# Patient Record
Sex: Female | Born: 1935 | Race: White | Hispanic: No | Smoking: Never smoker
Health system: Southern US, Community
[De-identification: ages and names within clinical notes are randomized; demographics above are authoritative.]

## PROBLEM LIST (undated history)

## (undated) DIAGNOSIS — R251 Tremor, unspecified: Secondary | ICD-10-CM

## (undated) DIAGNOSIS — K922 Gastrointestinal hemorrhage, unspecified: Secondary | ICD-10-CM

## (undated) DIAGNOSIS — N390 Urinary tract infection, site not specified: Secondary | ICD-10-CM

## (undated) DIAGNOSIS — I729 Aneurysm of unspecified site: Secondary | ICD-10-CM

## (undated) DIAGNOSIS — K219 Gastro-esophageal reflux disease without esophagitis: Secondary | ICD-10-CM

## (undated) DIAGNOSIS — I10 Essential (primary) hypertension: Secondary | ICD-10-CM

## (undated) DIAGNOSIS — D126 Benign neoplasm of colon, unspecified: Secondary | ICD-10-CM

## (undated) DIAGNOSIS — Z8719 Personal history of other diseases of the digestive system: Secondary | ICD-10-CM

## (undated) DIAGNOSIS — D62 Acute posthemorrhagic anemia: Secondary | ICD-10-CM

## (undated) DIAGNOSIS — K5792 Diverticulitis of intestine, part unspecified, without perforation or abscess without bleeding: Secondary | ICD-10-CM

## (undated) DIAGNOSIS — I509 Heart failure, unspecified: Secondary | ICD-10-CM

## (undated) HISTORY — PX: KNEE ARTHROSCOPY: SHX127

---

## 1968-12-28 HISTORY — PX: CEREBRAL ANEURYSM REPAIR: SHX164

## 1970-04-29 DIAGNOSIS — I729 Aneurysm of unspecified site: Secondary | ICD-10-CM

## 1970-04-29 HISTORY — DX: Aneurysm of unspecified site: I72.9

## 1999-08-13 ENCOUNTER — Encounter: Admission: RE | Admit: 1999-08-13 | Discharge: 1999-08-13 | Payer: Self-pay | Admitting: *Deleted

## 1999-08-13 ENCOUNTER — Encounter: Payer: Self-pay | Admitting: *Deleted

## 1999-08-14 ENCOUNTER — Ambulatory Visit (HOSPITAL_BASED_OUTPATIENT_CLINIC_OR_DEPARTMENT_OTHER): Admission: RE | Admit: 1999-08-14 | Discharge: 1999-08-14 | Payer: Self-pay | Admitting: *Deleted

## 2000-12-23 ENCOUNTER — Encounter: Admission: RE | Admit: 2000-12-23 | Discharge: 2000-12-23 | Payer: Self-pay | Admitting: Cardiology

## 2000-12-23 ENCOUNTER — Encounter: Payer: Self-pay | Admitting: Cardiology

## 2004-02-07 ENCOUNTER — Ambulatory Visit (HOSPITAL_COMMUNITY): Admission: RE | Admit: 2004-02-07 | Discharge: 2004-02-07 | Payer: Self-pay | Admitting: Cardiology

## 2005-02-19 ENCOUNTER — Encounter: Admission: RE | Admit: 2005-02-19 | Discharge: 2005-02-19 | Payer: Self-pay | Admitting: Orthopaedic Surgery

## 2005-03-07 ENCOUNTER — Encounter: Admission: RE | Admit: 2005-03-07 | Discharge: 2005-03-07 | Payer: Self-pay | Admitting: Orthopaedic Surgery

## 2005-03-20 ENCOUNTER — Encounter: Admission: RE | Admit: 2005-03-20 | Discharge: 2005-03-20 | Payer: Self-pay | Admitting: Orthopaedic Surgery

## 2006-06-12 ENCOUNTER — Emergency Department (HOSPITAL_COMMUNITY): Admission: EM | Admit: 2006-06-12 | Discharge: 2006-06-12 | Payer: Self-pay | Admitting: Emergency Medicine

## 2006-12-24 ENCOUNTER — Emergency Department (HOSPITAL_COMMUNITY): Admission: EM | Admit: 2006-12-24 | Discharge: 2006-12-24 | Payer: Self-pay | Admitting: Emergency Medicine

## 2008-10-13 ENCOUNTER — Inpatient Hospital Stay (HOSPITAL_COMMUNITY): Admission: EM | Admit: 2008-10-13 | Discharge: 2008-10-16 | Payer: Self-pay | Admitting: Emergency Medicine

## 2009-01-23 ENCOUNTER — Ambulatory Visit: Payer: Self-pay | Admitting: Oncology

## 2009-01-24 LAB — COMPREHENSIVE METABOLIC PANEL
CO2: 30 mEq/L (ref 19–32)
Calcium: 9.7 mg/dL (ref 8.4–10.5)
Chloride: 105 mEq/L (ref 96–112)
Creatinine, Ser: 0.73 mg/dL (ref 0.40–1.20)
Glucose, Bld: 106 mg/dL — ABNORMAL HIGH (ref 70–99)
Sodium: 140 mEq/L (ref 135–145)
Total Bilirubin: 0.4 mg/dL (ref 0.3–1.2)
Total Protein: 8.9 g/dL — ABNORMAL HIGH (ref 6.0–8.3)

## 2009-01-24 LAB — CBC WITH DIFFERENTIAL/PLATELET
Eosinophils Absolute: 0.2 10*3/uL (ref 0.0–0.5)
HCT: 34.6 % — ABNORMAL LOW (ref 34.8–46.6)
LYMPH%: 31.5 % (ref 14.0–49.7)
MONO#: 0.7 10*3/uL (ref 0.1–0.9)
NEUT#: 4.5 10*3/uL (ref 1.5–6.5)
NEUT%: 56.3 % (ref 38.4–76.8)
Platelets: 247 10*3/uL (ref 145–400)
WBC: 8 10*3/uL (ref 3.9–10.3)
lymph#: 2.5 10*3/uL (ref 0.9–3.3)

## 2009-01-24 LAB — LACTATE DEHYDROGENASE: LDH: 102 U/L (ref 94–250)

## 2009-01-26 LAB — KAPPA/LAMBDA LIGHT CHAINS
Kappa free light chain: 1.48 mg/dL (ref 0.33–1.94)
Kappa:Lambda Ratio: 0.02 — ABNORMAL LOW (ref 0.26–1.65)
Lambda Free Lght Chn: 60.1 mg/dL — ABNORMAL HIGH (ref 0.57–2.63)

## 2009-01-26 LAB — IMMUNOFIXATION ELECTROPHORESIS: IgA: 65 mg/dL — ABNORMAL LOW (ref 68–378)

## 2009-01-26 LAB — BETA 2 MICROGLOBULIN, SERUM: Beta-2 Microglobulin: 2.97 mg/L — ABNORMAL HIGH (ref 1.01–1.73)

## 2009-01-30 LAB — CREATININE CLEARANCE, URINE, 24 HOUR
Creatinine, 24H Ur: 742 mg/d (ref 700–1800)
Creatinine, Urine: 123.7 mg/dL
Creatinine: 0.73 mg/dL (ref 0.40–1.20)

## 2009-01-30 LAB — UIFE/LIGHT CHAINS/TP QN, 24-HR UR
Albumin, U: DETECTED
Beta, Urine: DETECTED — AB
Free Kappa Lt Chains,Ur: 0.9 mg/dL (ref 0.04–1.51)
Free Lambda Lt Chains,Ur: <0.05 mg/dL (ref 0.08–1.01)
Gamma Globulin, Urine: DETECTED — AB
Volume, Urine: 600 mL

## 2009-02-14 LAB — CBC WITH DIFFERENTIAL/PLATELET
BASO%: 0.5 % (ref 0.0–2.0)
HCT: 34.1 % — ABNORMAL LOW (ref 34.8–46.6)
MCHC: 34.2 g/dL (ref 31.5–36.0)
MONO#: 0.8 10*3/uL (ref 0.1–0.9)
NEUT%: 56.8 % (ref 38.4–76.8)
WBC: 7.6 10*3/uL (ref 3.9–10.3)
lymph#: 2.3 10*3/uL (ref 0.9–3.3)

## 2009-02-15 LAB — COMPREHENSIVE METABOLIC PANEL
ALT: <8 U/L (ref 0–35)
Albumin: 3.4 g/dL — ABNORMAL LOW (ref 3.5–5.2)
CO2: 26 mEq/L (ref 19–32)
Calcium: 9.4 mg/dL (ref 8.4–10.5)
Chloride: 104 mEq/L (ref 96–112)
Potassium: 4.3 mEq/L (ref 3.5–5.3)
Sodium: 141 mEq/L (ref 135–145)
Total Protein: 8 g/dL (ref 6.0–8.3)

## 2009-02-15 LAB — LACTATE DEHYDROGENASE: LDH: 108 U/L (ref 94–250)

## 2009-04-11 ENCOUNTER — Ambulatory Visit: Payer: Self-pay | Admitting: Oncology

## 2009-04-29 HISTORY — PX: HIP FRACTURE SURGERY: SHX118

## 2009-05-01 LAB — CBC WITH DIFFERENTIAL/PLATELET
BASO%: 0.2 % (ref 0.0–2.0)
Basophils Absolute: 0 10e3/uL (ref 0.0–0.1)
EOS%: 3 % (ref 0.0–7.0)
Eosinophils Absolute: 0.3 10e3/uL (ref 0.0–0.5)
HCT: 35 % (ref 34.8–46.6)
HGB: 11.9 g/dL (ref 11.6–15.9)
LYMPH%: 27 % (ref 14.0–49.7)
MCH: 31.7 pg (ref 25.1–34.0)
MCHC: 33.9 g/dL (ref 31.5–36.0)
MCV: 93.7 fL (ref 79.5–101.0)
MONO#: 0.8 10e3/uL (ref 0.1–0.9)
MONO%: 9.4 % (ref 0.0–14.0)
NEUT#: 5.2 10e3/uL (ref 1.5–6.5)
NEUT%: 60.4 % (ref 38.4–76.8)
Platelets: 287 10e3/uL (ref 145–400)
RBC: 3.73 10e6/uL (ref 3.70–5.45)
RDW: 13.2 % (ref 11.2–14.5)
WBC: 8.6 10e3/uL (ref 3.9–10.3)
lymph#: 2.3 10e3/uL (ref 0.9–3.3)

## 2009-05-01 LAB — COMPREHENSIVE METABOLIC PANEL
ALT: 14 U/L (ref 0–35)
AST: 21 U/L (ref 0–37)
Albumin: 3.2 g/dL — ABNORMAL LOW (ref 3.5–5.2)
Alkaline Phosphatase: 53 U/L (ref 39–117)
BUN: 22 mg/dL (ref 6–23)
CO2: 30 mEq/L (ref 19–32)
Calcium: 9.8 mg/dL (ref 8.4–10.5)
Chloride: 102 mEq/L (ref 96–112)
Creatinine, Ser: 0.82 mg/dL (ref 0.40–1.20)
Glucose, Bld: 103 mg/dL — ABNORMAL HIGH (ref 70–99)
Potassium: 3.7 mEq/L (ref 3.5–5.3)
Sodium: 139 mEq/L (ref 135–145)
Total Bilirubin: 0.5 mg/dL (ref 0.3–1.2)
Total Protein: 9.3 g/dL — ABNORMAL HIGH (ref 6.0–8.3)

## 2009-05-01 LAB — LACTATE DEHYDROGENASE: LDH: 108 U/L (ref 94–250)

## 2009-05-02 LAB — IGG, IGA, IGM
IgA: 61 mg/dL — ABNORMAL LOW (ref 68–378)
IgG (Immunoglobin G), Serum: 787 mg/dL (ref 694–1618)
IgM, Serum: 2880 mg/dL — ABNORMAL HIGH (ref 60–263)

## 2009-05-02 LAB — KAPPA/LAMBDA LIGHT CHAINS
Kappa free light chain: 1.76 mg/dL (ref 0.33–1.94)
Kappa:Lambda Ratio: 0.03 — ABNORMAL LOW (ref 0.26–1.65)
Lambda Free Lght Chn: 53 mg/dL — ABNORMAL HIGH (ref 0.57–2.63)

## 2009-05-30 ENCOUNTER — Ambulatory Visit: Payer: Self-pay | Admitting: Oncology

## 2009-06-01 LAB — COMPREHENSIVE METABOLIC PANEL
ALT: 13 U/L (ref 0–35)
AST: 21 U/L (ref 0–37)
Albumin: 3.1 g/dL — ABNORMAL LOW (ref 3.5–5.2)
Alkaline Phosphatase: 53 U/L (ref 39–117)
BUN: 19 mg/dL (ref 6–23)
CO2: 30 mEq/L (ref 19–32)
Calcium: 10.1 mg/dL (ref 8.4–10.5)
Chloride: 104 mEq/L (ref 96–112)
Creatinine, Ser: 1 mg/dL (ref 0.40–1.20)
Glucose, Bld: 110 mg/dL — ABNORMAL HIGH (ref 70–99)
Potassium: 3.5 mEq/L (ref 3.5–5.3)
Sodium: 142 mEq/L (ref 135–145)
Total Bilirubin: 0.6 mg/dL (ref 0.3–1.2)
Total Protein: 9.5 g/dL — ABNORMAL HIGH (ref 6.0–8.3)

## 2009-06-01 LAB — CBC WITH DIFFERENTIAL/PLATELET
BASO%: 0.6 % (ref 0.0–2.0)
Basophils Absolute: 0.1 10*3/uL (ref 0.0–0.1)
EOS%: 1.5 % (ref 0.0–7.0)
Eosinophils Absolute: 0.1 10*3/uL (ref 0.0–0.5)
HCT: 36.4 % (ref 34.8–46.6)
HGB: 11.8 g/dL (ref 11.6–15.9)
LYMPH%: 29.3 % (ref 14.0–49.7)
MCH: 30 pg (ref 25.1–34.0)
MCHC: 32.4 g/dL (ref 31.5–36.0)
MCV: 92.6 fL (ref 79.5–101.0)
MONO#: 0.8 10*3/uL (ref 0.1–0.9)
MONO%: 9.2 % (ref 0.0–14.0)
NEUT#: 5 10*3/uL (ref 1.5–6.5)
NEUT%: 59.4 % (ref 38.4–76.8)
Platelets: 328 10*3/uL (ref 145–400)
RBC: 3.93 10*6/uL (ref 3.70–5.45)
RDW: 13 % (ref 11.2–14.5)
WBC: 8.5 10*3/uL (ref 3.9–10.3)
lymph#: 2.5 10*3/uL (ref 0.9–3.3)
nRBC: 0 % (ref 0–0)

## 2009-06-01 LAB — LACTATE DEHYDROGENASE: LDH: 103 U/L (ref 94–250)

## 2009-06-02 LAB — KAPPA/LAMBDA LIGHT CHAINS
Kappa free light chain: 1.46 mg/dL (ref 0.33–1.94)
Kappa:Lambda Ratio: 0.02 — ABNORMAL LOW (ref 0.26–1.65)
Lambda Free Lght Chn: 82 mg/dL — ABNORMAL HIGH (ref 0.57–2.63)

## 2009-06-02 LAB — IGG, IGA, IGM
IgA: 71 mg/dL (ref 68–378)
IgG (Immunoglobin G), Serum: 915 mg/dL (ref 694–1618)
IgM, Serum: 3250 mg/dL — ABNORMAL HIGH (ref 60–263)

## 2009-06-16 ENCOUNTER — Inpatient Hospital Stay (HOSPITAL_COMMUNITY): Admission: EM | Admit: 2009-06-16 | Discharge: 2009-06-23 | Payer: Self-pay | Admitting: Emergency Medicine

## 2009-06-27 DIAGNOSIS — D62 Acute posthemorrhagic anemia: Secondary | ICD-10-CM

## 2009-06-27 DIAGNOSIS — D126 Benign neoplasm of colon, unspecified: Secondary | ICD-10-CM

## 2009-06-27 DIAGNOSIS — Z8719 Personal history of other diseases of the digestive system: Secondary | ICD-10-CM

## 2009-06-27 HISTORY — PX: COLONOSCOPY W/ CONTROL OF HEMORRHAGE: SHX1377

## 2009-06-27 HISTORY — DX: Personal history of other diseases of the digestive system: Z87.19

## 2009-06-27 HISTORY — DX: Acute posthemorrhagic anemia: D62

## 2009-06-27 HISTORY — DX: Benign neoplasm of colon, unspecified: D12.6

## 2009-08-12 ENCOUNTER — Inpatient Hospital Stay (HOSPITAL_COMMUNITY): Admission: EM | Admit: 2009-08-12 | Discharge: 2009-08-29 | Payer: Self-pay | Admitting: Emergency Medicine

## 2009-08-14 ENCOUNTER — Encounter (INDEPENDENT_AMBULATORY_CARE_PROVIDER_SITE_OTHER): Payer: Self-pay | Admitting: Internal Medicine

## 2010-03-04 ENCOUNTER — Emergency Department (HOSPITAL_COMMUNITY): Admission: EM | Admit: 2010-03-04 | Discharge: 2010-03-04 | Payer: Self-pay | Admitting: Emergency Medicine

## 2010-03-09 ENCOUNTER — Emergency Department (HOSPITAL_COMMUNITY): Admission: EM | Admit: 2010-03-09 | Discharge: 2010-03-09 | Payer: Self-pay | Admitting: Emergency Medicine

## 2010-05-14 ENCOUNTER — Ambulatory Visit
Admission: RE | Admit: 2010-05-14 | Discharge: 2010-05-14 | Payer: Self-pay | Source: Home / Self Care | Attending: Surgery | Admitting: Surgery

## 2010-05-19 ENCOUNTER — Encounter: Payer: Self-pay | Admitting: Orthopaedic Surgery

## 2010-05-20 ENCOUNTER — Encounter (HOSPITAL_COMMUNITY): Payer: Self-pay | Admitting: Oncology

## 2010-07-10 LAB — URINALYSIS, ROUTINE W REFLEX MICROSCOPIC
Bilirubin Urine: NEGATIVE
Glucose, UA: NEGATIVE mg/dL
Ketones, ur: NEGATIVE mg/dL
Nitrite: NEGATIVE
Protein, ur: NEGATIVE mg/dL
Specific Gravity, Urine: 1.005 (ref 1.005–1.030)
Urobilinogen, UA: 0.2 mg/dL (ref 0.0–1.0)
pH: 7.5 (ref 5.0–8.0)

## 2010-07-10 LAB — CBC
HCT: 33.9 % — ABNORMAL LOW (ref 36.0–46.0)
Hemoglobin: 11.1 g/dL — ABNORMAL LOW (ref 12.0–15.0)
MCH: 30.2 pg (ref 26.0–34.0)
MCHC: 32.7 g/dL (ref 30.0–36.0)
MCV: 92.4 fL (ref 78.0–100.0)
Platelets: 278 K/uL (ref 150–400)
RBC: 3.67 MIL/uL — ABNORMAL LOW (ref 3.87–5.11)
RDW: 13.3 % (ref 11.5–15.5)
WBC: 11.9 K/uL — ABNORMAL HIGH (ref 4.0–10.5)

## 2010-07-10 LAB — URINE MICROSCOPIC-ADD ON

## 2010-07-10 LAB — DIFFERENTIAL
Basophils Absolute: 0 K/uL (ref 0.0–0.1)
Basophils Relative: 0 % (ref 0–1)
Eosinophils Absolute: 0.2 10*3/uL (ref 0.0–0.7)
Eosinophils Relative: 1 % (ref 0–5)
Lymphocytes Relative: 26 % (ref 12–46)
Lymphs Abs: 3 K/uL (ref 0.7–4.0)
Monocytes Absolute: 1.5 10*3/uL — ABNORMAL HIGH (ref 0.1–1.0)
Monocytes Relative: 12 % (ref 3–12)
Neutro Abs: 7.2 K/uL (ref 1.7–7.7)
Neutrophils Relative %: 61 % (ref 43–77)

## 2010-07-10 LAB — BASIC METABOLIC PANEL
BUN: 18 mg/dL (ref 6–23)
CO2: 27 mEq/L (ref 19–32)
Calcium: 9.8 mg/dL (ref 8.4–10.5)
Glucose, Bld: 108 mg/dL — ABNORMAL HIGH (ref 70–99)
Sodium: 131 mEq/L — ABNORMAL LOW (ref 135–145)

## 2010-07-10 LAB — BASIC METABOLIC PANEL WITH GFR
Chloride: 98 meq/L (ref 96–112)
Creatinine, Ser: 0.79 mg/dL (ref 0.4–1.2)
GFR calc Af Amer: >60 mL/min (ref >60)
GFR calc non Af Amer: >60 mL/min (ref >60)
Potassium: 3.9 meq/L (ref 3.5–5.1)

## 2010-07-10 LAB — URINE CULTURE
Colony Count: >=100000
Culture  Setup Time: 201111062123

## 2010-07-17 LAB — BASIC METABOLIC PANEL
BUN: 5 mg/dL — ABNORMAL LOW (ref 6–23)
BUN: 5 mg/dL — ABNORMAL LOW (ref 6–23)
BUN: 6 mg/dL (ref 6–23)
BUN: 3 mg/dL — ABNORMAL LOW (ref 6–23)
BUN: 6 mg/dL (ref 6–23)
BUN: 6 mg/dL (ref 6–23)
CO2: 28 mEq/L (ref 19–32)
CO2: 23 mEq/L (ref 19–32)
CO2: 23 mEq/L (ref 19–32)
CO2: 24 mEq/L (ref 19–32)
CO2: 25 mEq/L (ref 19–32)
CO2: 27 mEq/L (ref 19–32)
CO2: 27 mEq/L (ref 19–32)
CO2: 27 mEq/L (ref 19–32)
CO2: 32 mEq/L (ref 19–32)
Calcium: 7.7 mg/dL — ABNORMAL LOW (ref 8.4–10.5)
Calcium: 7.2 mg/dL — ABNORMAL LOW (ref 8.4–10.5)
Calcium: 7.5 mg/dL — ABNORMAL LOW (ref 8.4–10.5)
Calcium: 7.5 mg/dL — ABNORMAL LOW (ref 8.4–10.5)
Calcium: 7.5 mg/dL — ABNORMAL LOW (ref 8.4–10.5)
Calcium: 7.6 mg/dL — ABNORMAL LOW (ref 8.4–10.5)
Calcium: 7.7 mg/dL — ABNORMAL LOW (ref 8.4–10.5)
Calcium: 7.8 mg/dL — ABNORMAL LOW (ref 8.4–10.5)
Chloride: 106 mEq/L (ref 96–112)
Chloride: 108 mEq/L (ref 96–112)
Chloride: 106 mEq/L (ref 96–112)
Chloride: 108 mEq/L (ref 96–112)
Chloride: 109 mEq/L (ref 96–112)
Chloride: 110 mEq/L (ref 96–112)
Chloride: 110 mEq/L (ref 96–112)
Chloride: 113 mEq/L — ABNORMAL HIGH (ref 96–112)
Creatinine, Ser: 0.73 mg/dL (ref 0.4–1.2)
Creatinine, Ser: 0.77 mg/dL (ref 0.4–1.2)
Creatinine, Ser: 0.56 mg/dL (ref 0.4–1.2)
Creatinine, Ser: 0.57 mg/dL (ref 0.4–1.2)
Creatinine, Ser: 0.58 mg/dL (ref 0.4–1.2)
Creatinine, Ser: 0.66 mg/dL (ref 0.4–1.2)
GFR calc Af Amer: >60 mL/min (ref >60)
GFR calc Af Amer: >60 mL/min (ref >60)
GFR calc Af Amer: >60 mL/min (ref >60)
GFR calc Af Amer: >60 mL/min (ref >60)
GFR calc Af Amer: >60 mL/min (ref >60)
GFR calc Af Amer: >60 mL/min (ref >60)
GFR calc Af Amer: >60 mL/min (ref >60)
GFR calc Af Amer: >60 mL/min (ref >60)
GFR calc Af Amer: >60 mL/min (ref >60)
GFR calc non Af Amer: >60 mL/min (ref >60)
GFR calc non Af Amer: >60 mL/min (ref >60)
GFR calc non Af Amer: >60 mL/min (ref >60)
GFR calc non Af Amer: >60 mL/min (ref >60)
GFR calc non Af Amer: >60 mL/min (ref >60)
GFR calc non Af Amer: >60 mL/min (ref >60)
Glucose, Bld: 77 mg/dL (ref 70–99)
Glucose, Bld: 86 mg/dL (ref 70–99)
Glucose, Bld: 102 mg/dL — ABNORMAL HIGH (ref 70–99)
Glucose, Bld: 81 mg/dL (ref 70–99)
Glucose, Bld: 92 mg/dL (ref 70–99)
Glucose, Bld: 94 mg/dL (ref 70–99)
Potassium: 3.2 mEq/L — ABNORMAL LOW (ref 3.5–5.1)
Potassium: 3.2 mEq/L — ABNORMAL LOW (ref 3.5–5.1)
Potassium: 3.4 mEq/L — ABNORMAL LOW (ref 3.5–5.1)
Potassium: 3.9 mEq/L (ref 3.5–5.1)
Potassium: 4 mEq/L (ref 3.5–5.1)
Sodium: 141 mEq/L (ref 135–145)
Sodium: 137 mEq/L (ref 135–145)
Sodium: 138 mEq/L (ref 135–145)
Sodium: 141 mEq/L (ref 135–145)
Sodium: 141 mEq/L (ref 135–145)
Sodium: 142 mEq/L (ref 135–145)
Sodium: 144 mEq/L (ref 135–145)

## 2010-07-17 LAB — FOLATE: Folate: 10.8 ng/mL

## 2010-07-17 LAB — CBC
HCT: 19.4 % — ABNORMAL LOW (ref 36.0–46.0)
HCT: 22.4 % — ABNORMAL LOW (ref 36.0–46.0)
HCT: 23 % — ABNORMAL LOW (ref 36.0–46.0)
HCT: 23.1 % — ABNORMAL LOW (ref 36.0–46.0)
HCT: 23.2 % — ABNORMAL LOW (ref 36.0–46.0)
HCT: 23.5 % — ABNORMAL LOW (ref 36.0–46.0)
HCT: 25 % — ABNORMAL LOW (ref 36.0–46.0)
HCT: 26 % — ABNORMAL LOW (ref 36.0–46.0)
HCT: 39.1 % (ref 36.0–46.0)
Hemoglobin: 13.3 g/dL (ref 12.0–15.0)
Hemoglobin: 6.7 g/dL — CL (ref 12.0–15.0)
Hemoglobin: 7.7 g/dL — ABNORMAL LOW (ref 12.0–15.0)
Hemoglobin: 7.7 g/dL — ABNORMAL LOW (ref 12.0–15.0)
Hemoglobin: 8.1 g/dL — ABNORMAL LOW (ref 12.0–15.0)
Hemoglobin: 8.1 g/dL — ABNORMAL LOW (ref 12.0–15.0)
Hemoglobin: 8.1 g/dL — ABNORMAL LOW (ref 12.0–15.0)
Hemoglobin: 8.2 g/dL — ABNORMAL LOW (ref 12.0–15.0)
Hemoglobin: 8.6 g/dL — ABNORMAL LOW (ref 12.0–15.0)
Hemoglobin: 8.9 g/dL — ABNORMAL LOW (ref 12.0–15.0)
Hemoglobin: 9.2 g/dL — ABNORMAL LOW (ref 12.0–15.0)
Hemoglobin: 9.8 g/dL — ABNORMAL LOW (ref 12.0–15.0)
MCHC: 34.1 g/dL (ref 30.0–36.0)
MCHC: 33.9 g/dL (ref 30.0–36.0)
MCHC: 34.1 g/dL (ref 30.0–36.0)
MCHC: 34.3 g/dL (ref 30.0–36.0)
MCHC: 34.3 g/dL (ref 30.0–36.0)
MCHC: 34.3 g/dL (ref 30.0–36.0)
MCHC: 34.4 g/dL (ref 30.0–36.0)
MCHC: 34.4 g/dL (ref 30.0–36.0)
MCHC: 34.4 g/dL (ref 30.0–36.0)
MCHC: 34.6 g/dL (ref 30.0–36.0)
MCHC: 34.8 g/dL (ref 30.0–36.0)
MCHC: 35.1 g/dL (ref 30.0–36.0)
MCHC: 35.2 g/dL (ref 30.0–36.0)
MCV: 92.3 fL (ref 78.0–100.0)
MCV: 89.9 fL (ref 78.0–100.0)
MCV: 90.6 fL (ref 78.0–100.0)
MCV: 91 fL (ref 78.0–100.0)
MCV: 91.3 fL (ref 78.0–100.0)
MCV: 91.7 fL (ref 78.0–100.0)
MCV: 92.6 fL (ref 78.0–100.0)
MCV: 93.3 fL (ref 78.0–100.0)
MCV: 93.3 fL (ref 78.0–100.0)
MCV: 93.4 fL (ref 78.0–100.0)
MCV: 98.3 fL (ref 78.0–100.0)
Platelets: 272 10*3/uL (ref 150–400)
Platelets: 278 10*3/uL (ref 150–400)
Platelets: 228 10*3/uL (ref 150–400)
Platelets: 242 10*3/uL (ref 150–400)
Platelets: 242 10*3/uL (ref 150–400)
Platelets: 244 10*3/uL (ref 150–400)
Platelets: 248 10*3/uL (ref 150–400)
Platelets: 251 10*3/uL (ref 150–400)
Platelets: 253 10*3/uL (ref 150–400)
Platelets: 255 10*3/uL (ref 150–400)
Platelets: 257 10*3/uL (ref 150–400)
Platelets: 269 10*3/uL (ref 150–400)
Platelets: 278 10*3/uL (ref 150–400)
Platelets: 308 10*3/uL (ref 150–400)
RBC: 2.14 MIL/uL — ABNORMAL LOW (ref 3.87–5.11)
RBC: 2.22 MIL/uL — ABNORMAL LOW (ref 3.87–5.11)
RBC: 2.26 MIL/uL — ABNORMAL LOW (ref 3.87–5.11)
RBC: 2.4 MIL/uL — ABNORMAL LOW (ref 3.87–5.11)
RBC: 2.44 MIL/uL — ABNORMAL LOW (ref 3.87–5.11)
RBC: 2.52 MIL/uL — ABNORMAL LOW (ref 3.87–5.11)
RBC: 2.54 MIL/uL — ABNORMAL LOW (ref 3.87–5.11)
RBC: 2.58 MIL/uL — ABNORMAL LOW (ref 3.87–5.11)
RBC: 2.76 MIL/uL — ABNORMAL LOW (ref 3.87–5.11)
RBC: 2.87 MIL/uL — ABNORMAL LOW (ref 3.87–5.11)
RBC: 2.99 MIL/uL — ABNORMAL LOW (ref 3.87–5.11)
RBC: 3.05 MIL/uL — ABNORMAL LOW (ref 3.87–5.11)
RBC: 3.98 MIL/uL (ref 3.87–5.11)
RDW: 16 % — ABNORMAL HIGH (ref 11.5–15.5)
RDW: 14.3 % (ref 11.5–15.5)
RDW: 15.4 % (ref 11.5–15.5)
RDW: 15.9 % — ABNORMAL HIGH (ref 11.5–15.5)
RDW: 16.1 % — ABNORMAL HIGH (ref 11.5–15.5)
RDW: 16.1 % — ABNORMAL HIGH (ref 11.5–15.5)
RDW: 16.1 % — ABNORMAL HIGH (ref 11.5–15.5)
RDW: 17 % — ABNORMAL HIGH (ref 11.5–15.5)
WBC: 10.4 10*3/uL (ref 4.0–10.5)
WBC: 11.4 10*3/uL — ABNORMAL HIGH (ref 4.0–10.5)
WBC: 12.9 10*3/uL — ABNORMAL HIGH (ref 4.0–10.5)
WBC: 12.9 10*3/uL — ABNORMAL HIGH (ref 4.0–10.5)
WBC: 5.8 10*3/uL (ref 4.0–10.5)
WBC: 5.8 10*3/uL (ref 4.0–10.5)
WBC: 5.8 10*3/uL (ref 4.0–10.5)
WBC: 5.8 10*3/uL (ref 4.0–10.5)
WBC: 6.4 10*3/uL (ref 4.0–10.5)
WBC: 6.6 10*3/uL (ref 4.0–10.5)
WBC: 7.4 10*3/uL (ref 4.0–10.5)
WBC: 9.6 10*3/uL (ref 4.0–10.5)
WBC: 9.8 10*3/uL (ref 4.0–10.5)

## 2010-07-17 LAB — DIFFERENTIAL
Basophils Absolute: 0 10*3/uL (ref 0.0–0.1)
Basophils Absolute: 0.1 10*3/uL (ref 0.0–0.1)
Basophils Relative: 0 % (ref 0–1)
Basophils Relative: 1 % (ref 0–1)
Eosinophils Absolute: 0.1 10*3/uL (ref 0.0–0.7)
Eosinophils Relative: 1 % (ref 0–5)
Eosinophils Relative: 3 % (ref 0–5)
Lymphocytes Relative: 25 % (ref 12–46)
Lymphs Abs: 1.8 10*3/uL (ref 0.7–4.0)
Lymphs Abs: 1.9 10*3/uL (ref 0.7–4.0)
Lymphs Abs: 2.7 10*3/uL (ref 0.7–4.0)
Monocytes Absolute: 1 10*3/uL (ref 0.1–1.0)
Monocytes Absolute: 1.1 10*3/uL — ABNORMAL HIGH (ref 0.1–1.0)
Monocytes Relative: 17 % — ABNORMAL HIGH (ref 3–12)
Neutro Abs: 2.5 10*3/uL (ref 1.7–7.7)
Neutro Abs: 4.5 10*3/uL (ref 1.7–7.7)
Neutro Abs: 7.4 10*3/uL (ref 1.7–7.7)
Neutrophils Relative %: 43 % (ref 43–77)
Neutrophils Relative %: 61 % (ref 43–77)
Neutrophils Relative %: 65 % (ref 43–77)

## 2010-07-17 LAB — CULTURE, BLOOD (ROUTINE X 2): Culture: NO GROWTH

## 2010-07-17 LAB — PHOSPHORUS: Phosphorus: 3.2 mg/dL (ref 2.3–4.6)

## 2010-07-17 LAB — RETICULOCYTES: Retic Ct Pct: 3 % (ref 0.4–3.1)

## 2010-07-17 LAB — TYPE AND SCREEN: ABO/RH(D): B POS

## 2010-07-17 LAB — IRON AND TIBC
Saturation Ratios: 14 % — ABNORMAL LOW (ref 20–55)
TIBC: 139 ug/dL — ABNORMAL LOW (ref 250–470)
UIBC: 119 ug/dL

## 2010-07-17 LAB — VANCOMYCIN, TROUGH: Vancomycin Tr: 25.7 ug/mL (ref 10.0–20.0)

## 2010-07-17 LAB — HEMOGLOBIN AND HEMATOCRIT, BLOOD
HCT: 19.2 % — ABNORMAL LOW (ref 36.0–46.0)
HCT: 21.6 % — ABNORMAL LOW (ref 36.0–46.0)
HCT: 23.2 % — ABNORMAL LOW (ref 36.0–46.0)
Hemoglobin: 6.8 g/dL — CL (ref 12.0–15.0)
Hemoglobin: 7.3 g/dL — ABNORMAL LOW (ref 12.0–15.0)
Hemoglobin: 8.1 g/dL — ABNORMAL LOW (ref 12.0–15.0)
Hemoglobin: 8.4 g/dL — ABNORMAL LOW (ref 12.0–15.0)
Hemoglobin: 9.2 g/dL — ABNORMAL LOW (ref 12.0–15.0)

## 2010-07-17 LAB — CLOSTRIDIUM DIFFICILE EIA: C difficile Toxins A+B, EIA: NEGATIVE

## 2010-07-17 LAB — CROSSMATCH: ABO/RH(D): B POS

## 2010-07-17 LAB — URINALYSIS, ROUTINE W REFLEX MICROSCOPIC
Bilirubin Urine: NEGATIVE
Bilirubin Urine: NEGATIVE
Glucose, UA: NEGATIVE mg/dL
Glucose, UA: NEGATIVE mg/dL
Ketones, ur: 15 mg/dL — AB
Ketones, ur: NEGATIVE mg/dL
Protein, ur: NEGATIVE mg/dL
pH: 7 (ref 5.0–8.0)

## 2010-07-17 LAB — HEPATIC FUNCTION PANEL
ALT: 9 U/L (ref 0–35)
Bilirubin, Direct: <0.1 mg/dL (ref 0.0–0.3)

## 2010-07-17 LAB — POCT I-STAT, CHEM 8
BUN: 9 mg/dL (ref 6–23)
Calcium, Ion: 1.13 mmol/L (ref 1.12–1.32)
TCO2: 26 mmol/L (ref 0–100)

## 2010-07-17 LAB — MAGNESIUM: Magnesium: 1.6 mg/dL (ref 1.5–2.5)

## 2010-07-17 LAB — URINE MICROSCOPIC-ADD ON

## 2010-07-17 LAB — FERRITIN: Ferritin: 209 ng/mL (ref 10–291)

## 2010-07-17 LAB — CK TOTAL AND CKMB (NOT AT ARMC): CK, MB: 0.8 ng/mL (ref 0.3–4.0)

## 2010-07-17 LAB — POCT CARDIAC MARKERS

## 2010-07-17 LAB — LACTIC ACID, PLASMA: Lactic Acid, Venous: 3 mmol/L — ABNORMAL HIGH (ref 0.5–2.2)

## 2010-07-17 LAB — URINE CULTURE

## 2010-07-17 LAB — PROTIME-INR: Prothrombin Time: 14.7 seconds (ref 11.6–15.2)

## 2010-07-17 LAB — PREPARE RBC (CROSSMATCH)

## 2010-07-18 LAB — URINALYSIS, ROUTINE W REFLEX MICROSCOPIC
Bilirubin Urine: NEGATIVE
Hgb urine dipstick: NEGATIVE
Ketones, ur: NEGATIVE mg/dL
Leukocytes, UA: NEGATIVE
Nitrite: NEGATIVE
Nitrite: NEGATIVE
Protein, ur: NEGATIVE mg/dL
Specific Gravity, Urine: 1.014 (ref 1.005–1.030)
Urobilinogen, UA: 0.2 mg/dL (ref 0.0–1.0)
Urobilinogen, UA: 0.2 mg/dL (ref 0.0–1.0)

## 2010-07-18 LAB — CBC
HCT: 20.6 % — ABNORMAL LOW (ref 36.0–46.0)
HCT: 24.5 % — ABNORMAL LOW (ref 36.0–46.0)
HCT: 26.3 % — ABNORMAL LOW (ref 36.0–46.0)
HCT: 27 % — ABNORMAL LOW (ref 36.0–46.0)
Hemoglobin: 7.2 g/dL — ABNORMAL LOW (ref 12.0–15.0)
Hemoglobin: 8.3 g/dL — ABNORMAL LOW (ref 12.0–15.0)
Hemoglobin: 8.6 g/dL — ABNORMAL LOW (ref 12.0–15.0)
Hemoglobin: 8.6 g/dL — ABNORMAL LOW (ref 12.0–15.0)
Hemoglobin: 9.1 g/dL — ABNORMAL LOW (ref 12.0–15.0)
Hemoglobin: 9.1 g/dL — ABNORMAL LOW (ref 12.0–15.0)
Hemoglobin: 9.3 g/dL — ABNORMAL LOW (ref 12.0–15.0)
MCHC: 33.9 g/dL (ref 30.0–36.0)
MCHC: 34.6 g/dL (ref 30.0–36.0)
MCHC: 34.8 g/dL (ref 30.0–36.0)
MCHC: 34.9 g/dL (ref 30.0–36.0)
MCHC: 35.2 g/dL (ref 30.0–36.0)
MCHC: 35.7 g/dL (ref 30.0–36.0)
MCV: 91.4 fL (ref 78.0–100.0)
MCV: 91.8 fL (ref 78.0–100.0)
MCV: 92.8 fL (ref 78.0–100.0)
MCV: 92.9 fL (ref 78.0–100.0)
MCV: 93.1 fL (ref 78.0–100.0)
Platelets: 139 10*3/uL — ABNORMAL LOW (ref 150–400)
Platelets: 166 10*3/uL (ref 150–400)
Platelets: 238 10*3/uL (ref 150–400)
RBC: 2.21 MIL/uL — ABNORMAL LOW (ref 3.87–5.11)
RBC: 2.59 MIL/uL — ABNORMAL LOW (ref 3.87–5.11)
RBC: 2.63 MIL/uL — ABNORMAL LOW (ref 3.87–5.11)
RBC: 2.64 MIL/uL — ABNORMAL LOW (ref 3.87–5.11)
RBC: 2.87 MIL/uL — ABNORMAL LOW (ref 3.87–5.11)
RBC: 2.93 MIL/uL — ABNORMAL LOW (ref 3.87–5.11)
RBC: 3.51 MIL/uL — ABNORMAL LOW (ref 3.87–5.11)
RDW: 12.6 % (ref 11.5–15.5)
RDW: 12.7 % (ref 11.5–15.5)
RDW: 12.9 % (ref 11.5–15.5)
RDW: 13.3 % (ref 11.5–15.5)
RDW: 13.5 % (ref 11.5–15.5)
RDW: 13.6 % (ref 11.5–15.5)
WBC: 10.1 10*3/uL (ref 4.0–10.5)
WBC: 12.1 10*3/uL — ABNORMAL HIGH (ref 4.0–10.5)
WBC: 12.6 10*3/uL — ABNORMAL HIGH (ref 4.0–10.5)
WBC: 13.2 10*3/uL — ABNORMAL HIGH (ref 4.0–10.5)
WBC: 8.8 10*3/uL (ref 4.0–10.5)

## 2010-07-18 LAB — BASIC METABOLIC PANEL
BUN: 16 mg/dL (ref 6–23)
BUN: 17 mg/dL (ref 6–23)
CO2: 24 mEq/L (ref 19–32)
CO2: 24 mEq/L (ref 19–32)
CO2: 25 mEq/L (ref 19–32)
Calcium: 7.1 mg/dL — ABNORMAL LOW (ref 8.4–10.5)
Calcium: 7.4 mg/dL — ABNORMAL LOW (ref 8.4–10.5)
Calcium: 7.8 mg/dL — ABNORMAL LOW (ref 8.4–10.5)
Calcium: 8.2 mg/dL — ABNORMAL LOW (ref 8.4–10.5)
Chloride: 106 mEq/L (ref 96–112)
Chloride: 109 mEq/L (ref 96–112)
GFR calc Af Amer: >60 mL/min (ref >60)
GFR calc Af Amer: >60 mL/min (ref >60)
GFR calc Af Amer: >60 mL/min (ref >60)
GFR calc non Af Amer: >60 mL/min (ref >60)
GFR calc non Af Amer: >60 mL/min (ref >60)
GFR calc non Af Amer: >60 mL/min (ref >60)
Glucose, Bld: 117 mg/dL — ABNORMAL HIGH (ref 70–99)
Glucose, Bld: 122 mg/dL — ABNORMAL HIGH (ref 70–99)
Glucose, Bld: 125 mg/dL — ABNORMAL HIGH (ref 70–99)
Potassium: 3.3 mEq/L — ABNORMAL LOW (ref 3.5–5.1)
Potassium: 3.9 mEq/L (ref 3.5–5.1)
Potassium: 4 mEq/L (ref 3.5–5.1)
Sodium: 136 mEq/L (ref 135–145)
Sodium: 137 mEq/L (ref 135–145)
Sodium: 137 mEq/L (ref 135–145)
Sodium: 137 mEq/L (ref 135–145)
Sodium: 140 mEq/L (ref 135–145)

## 2010-07-18 LAB — COMPREHENSIVE METABOLIC PANEL
ALT: 13 U/L (ref 0–35)
ALT: 16 U/L (ref 0–35)
AST: 22 U/L (ref 0–37)
AST: 24 U/L (ref 0–37)
Albumin: 1.7 g/dL — ABNORMAL LOW (ref 3.5–5.2)
Alkaline Phosphatase: 55 U/L (ref 39–117)
BUN: 18 mg/dL (ref 6–23)
CO2: 26 mEq/L (ref 19–32)
CO2: 29 mEq/L (ref 19–32)
Calcium: 7.6 mg/dL — ABNORMAL LOW (ref 8.4–10.5)
Calcium: 9.6 mg/dL (ref 8.4–10.5)
Chloride: 110 mEq/L (ref 96–112)
Creatinine, Ser: 0.78 mg/dL (ref 0.4–1.2)
Creatinine, Ser: 0.84 mg/dL (ref 0.4–1.2)
GFR calc Af Amer: >60 mL/min (ref >60)
GFR calc Af Amer: >60 mL/min (ref >60)
GFR calc non Af Amer: >60 mL/min (ref >60)
GFR calc non Af Amer: >60 mL/min (ref >60)
Glucose, Bld: 122 mg/dL — ABNORMAL HIGH (ref 70–99)
Potassium: 3.6 mEq/L (ref 3.5–5.1)
Sodium: 140 mEq/L (ref 135–145)
Sodium: 141 mEq/L (ref 135–145)
Total Bilirubin: 1.1 mg/dL (ref 0.3–1.2)
Total Protein: 6.3 g/dL (ref 6.0–8.3)
Total Protein: 8.9 g/dL — ABNORMAL HIGH (ref 6.0–8.3)

## 2010-07-18 LAB — CULTURE, BLOOD (ROUTINE X 2)
Culture: NO GROWTH
Culture: NO GROWTH

## 2010-07-18 LAB — TYPE AND SCREEN
ABO/RH(D): B POS
Antibody Screen: NEGATIVE

## 2010-07-18 LAB — RETICULOCYTES
RBC.: 2.92 MIL/uL — ABNORMAL LOW (ref 3.87–5.11)
Retic Count, Absolute: 35 10*3/uL (ref 19.0–186.0)
Retic Ct Pct: 1.2 % (ref 0.4–3.1)

## 2010-07-18 LAB — URINE MICROSCOPIC-ADD ON

## 2010-07-18 LAB — MAGNESIUM
Magnesium: 1.6 mg/dL (ref 1.5–2.5)
Magnesium: 1.8 mg/dL (ref 1.5–2.5)

## 2010-07-18 LAB — URINE CULTURE: Colony Count: 45000

## 2010-07-18 LAB — PROTIME-INR
INR: 1.18 (ref 0.00–1.49)
INR: 1.29 (ref 0.00–1.49)
INR: 1.31 (ref 0.00–1.49)
INR: 1.39 (ref 0.00–1.49)
Prothrombin Time: 16 seconds — ABNORMAL HIGH (ref 11.6–15.2)
Prothrombin Time: 16.9 seconds — ABNORMAL HIGH (ref 11.6–15.2)

## 2010-07-18 LAB — DIFFERENTIAL
Eosinophils Relative: 0 % (ref 0–5)
Lymphocytes Relative: 11 % — ABNORMAL LOW (ref 12–46)
Lymphs Abs: 1.5 10*3/uL (ref 0.7–4.0)
Monocytes Relative: 6 % (ref 3–12)
Neutrophils Relative %: 83 % — ABNORMAL HIGH (ref 43–77)

## 2010-07-18 LAB — FERRITIN: Ferritin: 96 ng/mL (ref 10–291)

## 2010-07-18 LAB — IRON AND TIBC
Iron: 35 ug/dL — ABNORMAL LOW (ref 42–135)
Saturation Ratios: 15 % — ABNORMAL LOW (ref 20–55)
TIBC: 238 ug/dL — ABNORMAL LOW (ref 250–470)

## 2010-07-18 LAB — VITAMIN B12: Vitamin B-12: 576 pg/mL (ref 211–911)

## 2010-07-18 LAB — FOLATE: Folate: 8.4 ng/mL

## 2010-08-06 LAB — DIFFERENTIAL
Basophils Absolute: 0 10*3/uL (ref 0.0–0.1)
Basophils Relative: 0 % (ref 0–1)
Eosinophils Absolute: 0.1 10*3/uL (ref 0.0–0.7)
Monocytes Relative: 8 % (ref 3–12)
Neutrophils Relative %: 75 % (ref 43–77)

## 2010-08-06 LAB — BASIC METABOLIC PANEL
BUN: 21 mg/dL (ref 6–23)
BUN: 23 mg/dL (ref 6–23)
CO2: 27 mEq/L (ref 19–32)
CO2: 27 mEq/L (ref 19–32)
Calcium: 8 mg/dL — ABNORMAL LOW (ref 8.4–10.5)
Calcium: 8.9 mg/dL (ref 8.4–10.5)
Calcium: 9.1 mg/dL (ref 8.4–10.5)
Chloride: 102 mEq/L (ref 96–112)
Chloride: 114 mEq/L — ABNORMAL HIGH (ref 96–112)
Creatinine, Ser: 0.6 mg/dL (ref 0.4–1.2)
Creatinine, Ser: 0.71 mg/dL (ref 0.4–1.2)
Creatinine, Ser: 0.71 mg/dL (ref 0.4–1.2)
GFR calc Af Amer: >60 mL/min (ref >60)
GFR calc Af Amer: >60 mL/min (ref >60)
GFR calc non Af Amer: >60 mL/min (ref >60)
Glucose, Bld: 120 mg/dL — ABNORMAL HIGH (ref 70–99)
Glucose, Bld: 123 mg/dL — ABNORMAL HIGH (ref 70–99)
Potassium: 3.5 mEq/L (ref 3.5–5.1)
Potassium: 3.8 mEq/L (ref 3.5–5.1)
Sodium: 140 mEq/L (ref 135–145)
Sodium: 142 mEq/L (ref 135–145)

## 2010-08-06 LAB — URINE MICROSCOPIC-ADD ON

## 2010-08-06 LAB — CBC
HCT: 28.6 % — ABNORMAL LOW (ref 36.0–46.0)
HCT: 31 % — ABNORMAL LOW (ref 36.0–46.0)
Hemoglobin: 10.5 g/dL — ABNORMAL LOW (ref 12.0–15.0)
MCHC: 33.8 g/dL (ref 30.0–36.0)
MCHC: 34.4 g/dL (ref 30.0–36.0)
MCV: 91.6 fL (ref 78.0–100.0)
MCV: 91.9 fL (ref 78.0–100.0)
Platelets: 272 10*3/uL (ref 150–400)
Platelets: 273 10*3/uL (ref 150–400)
RBC: 2.92 MIL/uL — ABNORMAL LOW (ref 3.87–5.11)
RBC: 3.12 MIL/uL — ABNORMAL LOW (ref 3.87–5.11)
RBC: 3.51 MIL/uL — ABNORMAL LOW (ref 3.87–5.11)
RDW: 12.3 % (ref 11.5–15.5)
RDW: 12.4 % (ref 11.5–15.5)
WBC: 10.8 10*3/uL — ABNORMAL HIGH (ref 4.0–10.5)
WBC: 9.9 10*3/uL (ref 4.0–10.5)

## 2010-08-06 LAB — URINALYSIS, ROUTINE W REFLEX MICROSCOPIC
Nitrite: NEGATIVE
Protein, ur: NEGATIVE mg/dL
Specific Gravity, Urine: 1.024 (ref 1.005–1.030)
Urobilinogen, UA: 1 mg/dL (ref 0.0–1.0)

## 2010-08-06 LAB — URINE CULTURE: Colony Count: NO GROWTH

## 2010-08-06 LAB — EXPECTORATED SPUTUM ASSESSMENT W GRAM STAIN, RFLX TO RESP C

## 2010-08-06 LAB — CULTURE, BLOOD (ROUTINE X 2)

## 2010-08-06 LAB — MAGNESIUM: Magnesium: 1.9 mg/dL (ref 1.5–2.5)

## 2010-09-11 NOTE — H&P (Signed)
NAME:  Sarah Perkins, Sarah Perkins         ACCOUNT NO.:  [id number]   MEDICAL RECORD NO.:  [id number]          PATIENT TYPE:  EMS   LOCATION:  MAJO                         FACILITY:  [facility]   PHYSICIAN:  Eduard Clos, MDDATE OF BIRTH:  07/29/1917   DATE OF ADMISSION:  10/13/2008  DATE OF DISCHARGE:                              HISTORY & PHYSICAL   PRIMARY CARE PHYSICIAN:  Thayer Headings, M.D.   CHIEF COMPLAINT:  Shortness of breath.   HISTORY OF PRESENT ILLNESS:  An 77 year old female with a history of  brain aneurysm, status post surgery 30 years ago with a history of  hypertension, presented with ongoing cough and productive sputum over  the last 3 days with shortness of breath, increasing in intensity over  the last 3 to 4 days.  Patient has also been getting fatigued over the  last few days.  The patient presented to the ER.  In the ER, the patient  had a chest x-ray, which showed the possibility of pneumonia, and  patient has been admitted for further management.  Patient denies any  chest pain.  Denies any palpitations or loss of consciousness, focal  deficit but does have fatigue and weakness.  Denies any diarrhea,  discharge, dysuria, abdominal pain, fevers or chills.   PAST MEDICAL HISTORY:  1. Hypertension.  2. History of brain aneurysm, status post surgery.   PAST SURGICAL HISTORY:  Aneurysmal surgery for brain.   MEDICATIONS PRIOR TO ADMISSION:  Patient states that she takes 1 pill  for blood pressure, the name of which she does not remember.   ALLERGIES:  No known drug allergies.   SOCIAL HISTORY:  Patient denies smoking cigarettes, drinking alcohol, or  using illegal drugs.  Lives with her son.   FAMILY HISTORY:  Nothing contributory.   ALLERGIES:  No known drug allergies.   REVIEW OF SYSTEMS:  As per the history of present illness, nothing else  significant.   PHYSICAL EXAMINATION:  Patient examined at bedside.  Not in acute  distress.  VITAL SIGNS:   Blood pressure is 126/88, pulse 90 per minute, temperature  99.1, respirations 18 per minute, O2 sat 93%.  HEENT:  Anicteric.  No pallor.  CHEST:  Bilateral air entry present.  No rhonchi, no crepitation.  HEART:  S1 and S2 heard.  ABDOMEN:  Soft.  Nontender.  Bowel sounds heard.  CNS:  Awake, alert and oriented to time, place, and person.  Moves upper  and lower extremities 5/5.  EXTREMITIES:  Peripheral pulses felt.  No edema.   LABS:  Chest x-ray, right middle lobe density with  that could be  atelectatic pneumonia, chronic-appearing lung marking.  Also a  compression fracture at T12 of unknown age.   CBC:  WBC is 12.1, hemoglobin 11.1, hematocrit 32.2, platelets 272,  neutrophils 75%.  Basic metabolic panel:  Sodium 130, potassium 3.6,  chloride 102, carbon dioxide 27, glucose 120, BUN 23, creatinine 0.7,  calcium 9.1.  UA is showing small leukocytes, WBCs 3 to 6.   ASSESSMENT:  1. Pneumonia, probably community-acquired.  2. History of brain aneurysm, status post surgery.  3. History of hypertension.  4. Generalized weakness and fatigue, probably from pneumonia.   PLAN:  1. Admit patient to telemetry.  2. Start patient on Avelox IV.  3. Will place patient on p.r.n. antihypertensive and Norvasc.  4. Will get blood cultures, sputum culture.  5. Further recommendations as patient's condition evolves.      Eduard Clos, MD  Electronically Signed     ANK/MEDQ  D:  10/13/2008  T:  10/13/2008  Job:  [id number]   cc:   Thayer Headings, M.D.

## 2010-09-11 NOTE — Assessment & Plan Note (Signed)
OFFICE VISIT   Sarah Perkins, Sarah Perkins  DOB:  [birth year]                                       05/14/2010  CHART#:[id number]   REASON FOR VISIT:  Right heel ulcer.  This is a 77 year old female seen  request of Thea Silversmith for a nonhealing ulcer on the right heel with  concerns of poor circulation.   HISTORY:  This is a 77 year old female who I am seeing at the request of  Dr. Thea Silversmith for evaluation of a right heel ulcer.  The patient  developed her heel ulcer approximately 6 months ago at the time of a hip  fracture.  This was presumed to be a pressure ulcer sustained by being  in bed when she went to a nursing facility.  She has been receiving  local wound care; however, it has not healed.  She also has a brace for  pressure offloading.  The patient's foot has also been noted to be very  cold at times.  For that reasons there were concerns of poor  circulation.  She comes in today for further evaluation.   The patient suffers from hypertension, chronic renal failure.   REVIEW OF SYSTEMS:  VASCULAR:  Positive for nonhealing ulcer.  GI:  Positive for reflux.  HEME:  Positive for bleeding problems and anemia.  GU:  Positive for frequent urination.  MUSCULOSKELETAL:  Positive for arthritis.  SKIN:  Positive for a right heel ulcer.  All other review systems are negative.   PAST MEDICAL HISTORY:  1. Hypertension.  2. History of subarachnoid hemorrhage.  3. Chronic renal failure.  4. Allergic rhinitis.  5. Incontinence.  6. Overactive bladder.  7. Gastroesophageal reflux disease.  8. History of diverticular bleed.   PAST SURGICAL HISTORY:  1. Right knee arthroscopy.  2. Hysterectomy.  3. Brain surgery for subarachnoid hemorrhage.   SOCIAL HISTORY:  No alcohol.  No tobacco.   FAMILY HISTORY:  Positive for heart attack in her father at age 57 and a  stroke in her mother at age 78.   PHYSICAL EXAMINATION:  Heart rate 78, blood pressure 148/69,  respiratory  rate 320.  General:  She is resting comfortably in a wheelchair in no  distress.  HEENT:  Within normal limits.  Lungs:  Respirations are  nonlabored.  Cardiovascular:  Bounding posterior tibial pulse  bilaterally.  Cardiovascular:  Regular rate and rhythm.  Abdomen is  soft, nontender.  Musculoskeletal:  No major deformities.  Neuro:  No  focal deficits.  Skin:  She has a small 2-mm ulcer on her right heel  without surrounding erythema.  Both feet are edematous and cool.   DIAGNOSTIC STUDIES:  Ultrasound was performed today.  ABIs could not be  calculated due to noncompressible vessels.  However, she does have  triphasic waveforms.   ASSESSMENT/PLAN:  Nonhealing right heel ulcer.   PLAN:  Based on the physical exam findings of palpable pulses and  ultrasound findings with triphasic waveforms, I do not feel that  arterial insufficiency is contributing to her slow wound healing.  Most  likely she is still having some pressure contact with her brace.  I  would recommend getting her brace refitted.  I have referred her to  [facility] for getting evaluated for a new brace.  In the  interim, I would continue with her wound care.  This should ultimately  heal.  I do not think it is attributed to poor circulation with our  above findings.  I am scheduling the patient come back on a p.r.n.  basis.     Jorge Ny, MD  Electronically Signed   VWB/MEDQ  D:  05/14/2010  T:  05/15/2010  Job:  [id number]   cc:   Dr. Thea Silversmith

## 2010-09-11 NOTE — Discharge Summary (Signed)
Sarah Perkins, Sarah Perkins         ACCOUNT NO.:  [id number]   MEDICAL RECORD NO.:  [id number]          PATIENT TYPE:  INP   LOCATION:  5126                         FACILITY:  [facility]   PHYSICIAN:  Lonia Blood, M.D.DATE OF BIRTH:  [birth year]   DATE OF ADMISSION:  10/13/2008  DATE OF DISCHARGE:  10/16/2008                               DISCHARGE SUMMARY   PRIMARY CARE PHYSICIAN:  Dr. Thayer Headings.   DISCHARGE DIAGNOSES:  1. Right upper lobe nodular community-acquired pneumonia.      a.     Consider re-evaluation of right upper lobe chest with CT       scan in 1 year to assure resolution of nodularity.      b.     Clinical resolution of symptoms with empiric antibiotic       coverage.  2. Urinary tract infection.      a.     Culture unrevealing.      b.     Clinical resolution with empiric antibiotic therapy.  3. Hypertension - well controlled.  4. Hypokalemia - secondary to hydrochlorothiazide therapy with      decreased oral intake - hydrochlorothiazide discontinued.   DISCHARGE MEDICATIONS:  1. Norvasc 10 mg p.o. daily.  2. Avelox 400 mg p.o. daily x4 days then stop.   FOLLOW UP:  The patient is advised to follow up with Dr. Thayer Headings, her primary care physician, within 5-7 days for routine  medical recheck.  At that time the patient's blood pressure should be  assessed to ensure that she is responding appropriately to her Norvasc  therapy.   PROCEDURES:  CT scan of the chest October 14, 2008 - nonspecific nodularity  within the right upper lobe is likely the sequelae of inflammation or  infection.  If the patient is at low risk for bronchogenic carcinoma  (which she is), follow-up chest CT at 12 months is recommended.  Subpleural scar-like density within the right upper lobe which is likely  the sequelae of prior inflammation or infection.   CONSULTATIONS:  None.   HOSPITAL COURSE:  [name] is a very pleasant 77 year old  female with a  remarkably minimal past medical history who presented to  the hospital on October 13, 2008, with complaints of shortness of breath.  Evaluation in the emergency room revealed evidence of a right upper lobe  pneumonia.  Due to the odd appearance of the infiltrate on chest x-ray,  a CT scan of the chest was obtained to further evaluate.  This revealed  what did appear to be nodular type inflammation most consistent with an  infectious source.  In addition, the patient was noted to be hypokalemia  with a potassium in the low 3's and also a urinalysis was found to be  consistent with a urinary tract infection.  The patient was admitted to  the acute units.  Her hypokalemia was felt to be secondary to the  hydrochlorothiazide component of her of amiloride HCT blood pressure  therapy.  This was discontinued and Norvasc alone was administered.  Potassium was administered orally and the patient's hypokalemia  resolved.  Urine  was sent for culture and sputum was collected for  culture as well.  Unfortunately, the sputum sample was most consistent  with oral flora.  Urine culture, despite the positive UA, was  unrevealing.  Nonetheless, the patient clinically responded very nicely  to her empiric Avelox therapy.  By October 16, 2008, the patient was  afebrile with  stable vital signs.  The O2 saturations were in the mid to low 90s on  room air.  White blood cell count, which had originally been elevated,  had declined into a normal range.  Electrolytes proved to be stable.  As  a result, the patient was cleared for discharge home on October 16, 2008  with follow-up with her primary care physician as noted above.      Lonia Blood, M.D.  Electronically Signed     JTM/MEDQ  D:  10/16/2008  T:  10/16/2008  Job:  [id number]   cc:   Thayer Headings, M.D.

## 2010-09-14 NOTE — H&P (Signed)
[place]  Patient:    Sarah Perkins, Sarah Perkins                  MRN: [id number] Attending:  Reynolds Bowl, M.D.                         History and Physical  PREOPERATIVE DIAGNOSES:  Right knee, rule out tear of meniscus; chondromalacia,  chondral flap or loose body.  POSTOPERATIVE DIAGNOSES:  Anterior horizontal flap tear of lateral meniscus and  small area of grade 3 chondromalacia, lateral femoral condyle.  OPERATIVE PROCEDURE:  Arthroscopic exam, resection of anterior one-half of lateral meniscus and lavage of joint.  SURGEON: Reynolds Bowl, M.D.  DESCRIPTION OF PROCEDURE:  Patient was given a knee block anesthetic. Anteromedial and anterolateral portals were established.  In the suprapatellar area, there were small bits of chondral material which were washed out of the joint.  Coming along the medial side, the medial meniscus had a small area of fibrillated edge anteriorly and that was smoothed back; otherwise, the joint was pretty much normal. The anterior cruciate ligament appeared normal, then laterally, there was an anterior tear of the lateral meniscus blocking visualization of the rest of the  joint.  This was debrided using the rotary meniscotome and biting punches.  As  removed that initial mass, I saw a couple indentations of the articular cartilage which appeared to have incurred by the operative instruments; however, I could ake the adjacent areas and push in on the articular cartilage and it did not seem to give and cause any similar defect when I tried to purposely do it.  Posteriorly, with the knee in extended position, there was about a centimeter area of grade 3 chondromalacia of the lateral femoral condyle.  Most of the posterior one-half of the lateral femoral condyle had just softened anterior edge and it as left alone, so from about the anterior one-half, two-thirds of the meniscus were removed and all of  the anterior horn was removed.  Having done this, joint was irrigated a little further, then portals were approximated with 5-0 nylon.  Marcaine with epinephrine was injected in the knee and a bulky dressing applied.  Patient returned to the recovery room in good condition.  Instructions are to take Vicodin for pain, use cold pack as tolerated, do frequent quad sets and ankle pumps, full weightbearing with a straight knee for several days, see me in the office as planned or call sooner with concerns. DD:  08/14/99 TD:  08/14/99 Job: [id number]

## 2011-02-08 LAB — URINE MICROSCOPIC-ADD ON

## 2011-02-08 LAB — COMPREHENSIVE METABOLIC PANEL WITH GFR
BUN: 18
Calcium: 9.1
GFR calc non Af Amer: 57 — ABNORMAL LOW
Glucose, Bld: 129 — ABNORMAL HIGH
Total Protein: 8.9 — ABNORMAL HIGH

## 2011-02-08 LAB — COMPREHENSIVE METABOLIC PANEL
ALT: 19
AST: 30
Albumin: 2.7 — ABNORMAL LOW
Alkaline Phosphatase: 46
CO2: 28
Chloride: 102
Creatinine, Ser: 0.93
GFR calc Af Amer: >60
Potassium: 3.3 — ABNORMAL LOW
Sodium: 138
Total Bilirubin: 0.8

## 2011-02-08 LAB — URINALYSIS, ROUTINE W REFLEX MICROSCOPIC
Bilirubin Urine: NEGATIVE
Glucose, UA: NEGATIVE
Ketones, ur: NEGATIVE
Nitrite: NEGATIVE
Protein, ur: 30 — AB
Specific Gravity, Urine: 1.022
Urobilinogen, UA: 1
pH: 8

## 2011-02-08 LAB — CULTURE, BLOOD (ROUTINE X 2)
Culture: NO GROWTH
Culture: NO GROWTH

## 2011-02-08 LAB — DIFFERENTIAL
Basophils Absolute: 0.2 — ABNORMAL HIGH
Basophils Relative: 1
Eosinophils Absolute: 0
Eosinophils Relative: 0
Lymphocytes Relative: 9 — ABNORMAL LOW
Lymphs Abs: 1.6
Monocytes Absolute: 1.2 — ABNORMAL HIGH
Monocytes Relative: 7
Neutro Abs: 14.9 — ABNORMAL HIGH
Neutrophils Relative %: 83 — ABNORMAL HIGH

## 2011-02-08 LAB — URINE CULTURE: Colony Count: >=100000

## 2011-02-08 LAB — CBC
HCT: 30.9 — ABNORMAL LOW
Hemoglobin: 10.8 — ABNORMAL LOW
MCHC: 35.1
MCV: 89.8
Platelets: 273
RBC: 3.44 — ABNORMAL LOW
RDW: 12.7
WBC: 17.9 — ABNORMAL HIGH

## 2011-03-26 ENCOUNTER — Other Ambulatory Visit: Payer: Self-pay | Admitting: Neurology

## 2011-03-26 DIAGNOSIS — G252 Other specified forms of tremor: Secondary | ICD-10-CM

## 2011-03-26 DIAGNOSIS — F09 Unspecified mental disorder due to known physiological condition: Secondary | ICD-10-CM

## 2011-03-26 DIAGNOSIS — R269 Unspecified abnormalities of gait and mobility: Secondary | ICD-10-CM

## 2011-03-29 ENCOUNTER — Ambulatory Visit
Admission: RE | Admit: 2011-03-29 | Discharge: 2011-03-29 | Disposition: A | Discharge status: Home / Self Care | Payer: Medicare Other | Source: Ambulatory Visit | Attending: Neurology | Admitting: Neurology

## 2011-03-29 DIAGNOSIS — G252 Other specified forms of tremor: Secondary | ICD-10-CM

## 2011-03-29 DIAGNOSIS — G250 Essential tremor: Secondary | ICD-10-CM

## 2011-03-29 DIAGNOSIS — F09 Unspecified mental disorder due to known physiological condition: Secondary | ICD-10-CM

## 2011-03-29 DIAGNOSIS — R269 Unspecified abnormalities of gait and mobility: Secondary | ICD-10-CM

## 2011-05-15 ENCOUNTER — Emergency Department (HOSPITAL_COMMUNITY): Payer: Medicare Other

## 2011-05-15 ENCOUNTER — Encounter (HOSPITAL_COMMUNITY): Payer: Self-pay | Admitting: *Deleted

## 2011-05-15 ENCOUNTER — Inpatient Hospital Stay (HOSPITAL_COMMUNITY)
Admission: EM | Admit: 2011-05-15 | Discharge: 2011-05-21 | DRG: 871 | Disposition: A | Discharge status: Skilled Nursing Facility | Payer: Medicare Other | Source: Ambulatory Visit | Attending: Internal Medicine | Admitting: Internal Medicine

## 2011-05-15 DIAGNOSIS — I5033 Acute on chronic diastolic (congestive) heart failure: Secondary | ICD-10-CM | POA: Diagnosis present

## 2011-05-15 DIAGNOSIS — D649 Anemia, unspecified: Secondary | ICD-10-CM | POA: Diagnosis present

## 2011-05-15 DIAGNOSIS — R5381 Other malaise: Secondary | ICD-10-CM | POA: Diagnosis present

## 2011-05-15 DIAGNOSIS — E8809 Other disorders of plasma-protein metabolism, not elsewhere classified: Secondary | ICD-10-CM | POA: Diagnosis present

## 2011-05-15 DIAGNOSIS — D72829 Elevated white blood cell count, unspecified: Secondary | ICD-10-CM | POA: Diagnosis present

## 2011-05-15 DIAGNOSIS — I609 Nontraumatic subarachnoid hemorrhage, unspecified: Secondary | ICD-10-CM | POA: Insufficient documentation

## 2011-05-15 DIAGNOSIS — I509 Heart failure, unspecified: Secondary | ICD-10-CM

## 2011-05-15 DIAGNOSIS — N179 Acute kidney failure, unspecified: Secondary | ICD-10-CM | POA: Diagnosis present

## 2011-05-15 DIAGNOSIS — D696 Thrombocytopenia, unspecified: Secondary | ICD-10-CM | POA: Diagnosis not present

## 2011-05-15 DIAGNOSIS — R6510 Systemic inflammatory response syndrome (SIRS) of non-infectious origin without acute organ dysfunction: Principal | ICD-10-CM | POA: Diagnosis present

## 2011-05-15 DIAGNOSIS — K5730 Diverticulosis of large intestine without perforation or abscess without bleeding: Secondary | ICD-10-CM | POA: Diagnosis not present

## 2011-05-15 DIAGNOSIS — K5900 Constipation, unspecified: Secondary | ICD-10-CM | POA: Diagnosis present

## 2011-05-15 DIAGNOSIS — E46 Unspecified protein-calorie malnutrition: Secondary | ICD-10-CM | POA: Diagnosis not present

## 2011-05-15 DIAGNOSIS — E876 Hypokalemia: Secondary | ICD-10-CM | POA: Diagnosis present

## 2011-05-15 DIAGNOSIS — N181 Chronic kidney disease, stage 1: Secondary | ICD-10-CM | POA: Diagnosis not present

## 2011-05-15 DIAGNOSIS — I129 Hypertensive chronic kidney disease with stage 1 through stage 4 chronic kidney disease, or unspecified chronic kidney disease: Secondary | ICD-10-CM | POA: Diagnosis present

## 2011-05-15 DIAGNOSIS — I10 Essential (primary) hypertension: Secondary | ICD-10-CM

## 2011-05-15 DIAGNOSIS — K219 Gastro-esophageal reflux disease without esophagitis: Secondary | ICD-10-CM | POA: Diagnosis present

## 2011-05-15 DIAGNOSIS — Z79899 Other long term (current) drug therapy: Secondary | ICD-10-CM | POA: Diagnosis not present

## 2011-05-15 DIAGNOSIS — Z5189 Encounter for other specified aftercare: Secondary | ICD-10-CM | POA: Diagnosis not present

## 2011-05-15 DIAGNOSIS — I119 Hypertensive heart disease without heart failure: Secondary | ICD-10-CM | POA: Diagnosis not present

## 2011-05-15 DIAGNOSIS — K5790 Diverticulosis of intestine, part unspecified, without perforation or abscess without bleeding: Secondary | ICD-10-CM

## 2011-05-15 DIAGNOSIS — J189 Pneumonia, unspecified organism: Secondary | ICD-10-CM | POA: Diagnosis present

## 2011-05-15 DIAGNOSIS — Z7982 Long term (current) use of aspirin: Secondary | ICD-10-CM | POA: Diagnosis not present

## 2011-05-15 DIAGNOSIS — N189 Chronic kidney disease, unspecified: Secondary | ICD-10-CM | POA: Diagnosis present

## 2011-05-15 DIAGNOSIS — A419 Sepsis, unspecified organism: Secondary | ICD-10-CM | POA: Diagnosis not present

## 2011-05-15 DIAGNOSIS — J90 Pleural effusion, not elsewhere classified: Secondary | ICD-10-CM | POA: Diagnosis not present

## 2011-05-15 DIAGNOSIS — R0602 Shortness of breath: Secondary | ICD-10-CM | POA: Diagnosis not present

## 2011-05-15 DIAGNOSIS — R918 Other nonspecific abnormal finding of lung field: Secondary | ICD-10-CM | POA: Diagnosis not present

## 2011-05-15 DIAGNOSIS — K5731 Diverticulosis of large intestine without perforation or abscess with bleeding: Secondary | ICD-10-CM | POA: Diagnosis not present

## 2011-05-15 DIAGNOSIS — I5030 Unspecified diastolic (congestive) heart failure: Secondary | ICD-10-CM | POA: Diagnosis not present

## 2011-05-15 HISTORY — DX: Heart failure, unspecified: I50.9

## 2011-05-15 HISTORY — DX: Essential (primary) hypertension: I10

## 2011-05-15 HISTORY — DX: Aneurysm of unspecified site: I72.9

## 2011-05-15 HISTORY — DX: Gastro-esophageal reflux disease without esophagitis: K21.9

## 2011-05-15 LAB — URINE MICROSCOPIC-ADD ON

## 2011-05-15 LAB — URINALYSIS, ROUTINE W REFLEX MICROSCOPIC
Bilirubin Urine: NEGATIVE
Glucose, UA: NEGATIVE mg/dL
Ketones, ur: NEGATIVE mg/dL
Leukocytes, UA: NEGATIVE
Nitrite: NEGATIVE
Protein, ur: NEGATIVE mg/dL
Specific Gravity, Urine: 1.012 (ref 1.005–1.030)
Urobilinogen, UA: 0.2 mg/dL (ref 0.0–1.0)
pH: 5.5 (ref 5.0–8.0)

## 2011-05-15 LAB — POCT I-STAT TROPONIN I: Troponin i, poc: 0.07 ng/mL (ref 0.00–0.08)

## 2011-05-15 LAB — DIFFERENTIAL
Eosinophils Relative: 0 % (ref 0–5)
Lymphs Abs: 2.4 10*3/uL (ref 0.7–4.0)
Monocytes Absolute: 1.9 10*3/uL — ABNORMAL HIGH (ref 0.1–1.0)
Monocytes Relative: 7 % (ref 3–12)
Neutro Abs: 22.9 10*3/uL — ABNORMAL HIGH (ref 1.7–7.7)

## 2011-05-15 LAB — PRO B NATRIURETIC PEPTIDE: Pro B Natriuretic peptide (BNP): 1982 pg/mL — ABNORMAL HIGH (ref 0–450)

## 2011-05-15 LAB — COMPREHENSIVE METABOLIC PANEL
CO2: 27 mEq/L (ref 19–32)
Calcium: 8.8 mg/dL (ref 8.4–10.5)
Creatinine, Ser: 1.11 mg/dL — ABNORMAL HIGH (ref 0.50–1.10)
GFR calc Af Amer: 49 mL/min — ABNORMAL LOW (ref >90)
GFR calc non Af Amer: 42 mL/min — ABNORMAL LOW (ref >90)
Glucose, Bld: 134 mg/dL — ABNORMAL HIGH (ref 70–99)

## 2011-05-15 LAB — CULTURE, BLOOD (ROUTINE X 2)
Culture  Setup Time: 201301160912
Culture: NO GROWTH

## 2011-05-15 LAB — TSH: TSH: 0.736 u[IU]/mL (ref 0.350–4.500)

## 2011-05-15 LAB — URINE CULTURE
Colony Count: 2000
Culture  Setup Time: 201301161648

## 2011-05-15 LAB — CARDIAC PANEL(CRET KIN+CKTOT+MB+TROPI)
Relative Index: INVALID (ref 0.0–2.5)
Troponin I: <0.3 ng/mL (ref <0.30)

## 2011-05-15 LAB — BASIC METABOLIC PANEL
Calcium: 9.2 mg/dL (ref 8.4–10.5)
Chloride: 101 mEq/L (ref 96–112)
Creatinine, Ser: 0.83 mg/dL (ref 0.50–1.10)
GFR calc Af Amer: 69 mL/min — ABNORMAL LOW (ref >90)
Sodium: 138 mEq/L (ref 135–145)

## 2011-05-15 LAB — CBC
MCH: 29.9 pg (ref 26.0–34.0)
MCHC: 32.7 g/dL (ref 30.0–36.0)
Platelets: 306 10*3/uL (ref 150–400)
RDW: 13.7 % (ref 11.5–15.5)

## 2011-05-15 LAB — PHOSPHORUS: Phosphorus: 3.2 mg/dL (ref 2.3–4.6)

## 2011-05-15 MED ORDER — IPRATROPIUM BROMIDE 0.02 % IN SOLN
0.5000 mg | Freq: Four times a day (QID) | RESPIRATORY_TRACT | Status: DC
  Administered 2011-05-15 – 2011-05-19 (×16): 0.5 mg via RESPIRATORY_TRACT
  Filled 2011-05-15 (×17): qty 2.5

## 2011-05-15 MED ORDER — ONDANSETRON HCL 4 MG PO TABS: 4.0000 mg | ORAL_TABLET | Freq: Four times a day (QID) | ORAL | Status: DC | PRN

## 2011-05-15 MED ORDER — MOXIFLOXACIN HCL IN NACL 400 MG/250ML IV SOLN
400.0000 mg | Freq: Once | INTRAVENOUS | Status: AC
  Administered 2011-05-15: 400 mg via INTRAVENOUS
  Filled 2011-05-15: qty 250

## 2011-05-15 MED ORDER — POTASSIUM CHLORIDE CRYS ER 20 MEQ PO TBCR
40.0000 meq | EXTENDED_RELEASE_TABLET | ORAL | Status: AC
  Administered 2011-05-15 (×2): 40 meq via ORAL
  Filled 2011-05-15 (×2): qty 2

## 2011-05-15 MED ORDER — MORPHINE SULFATE 2 MG/ML IJ SOLN: 2.0000 mg | INTRAMUSCULAR | Status: DC | PRN

## 2011-05-15 MED ORDER — FUROSEMIDE 10 MG/ML IJ SOLN
40.0000 mg | Freq: Once | INTRAMUSCULAR | Status: AC
  Administered 2011-05-15: 40 mg via INTRAVENOUS
  Filled 2011-05-15: qty 4

## 2011-05-15 MED ORDER — GUAIFENESIN-DM 100-10 MG/5ML PO SYRP: 5.0000 mL | ORAL_SOLUTION | ORAL | Status: DC | PRN

## 2011-05-15 MED ORDER — ONDANSETRON HCL 4 MG/2ML IJ SOLN: 4.0000 mg | Freq: Four times a day (QID) | INTRAMUSCULAR | Status: DC | PRN

## 2011-05-15 MED ORDER — SODIUM CHLORIDE 0.9 % IV SOLN: INTRAVENOUS | Status: AC

## 2011-05-15 MED ORDER — CALCIUM CARBONATE 1250 (500 CA) MG PO TABS
1.0000 | ORAL_TABLET | Freq: Every day | ORAL | Status: DC
  Administered 2011-05-15 – 2011-05-21 (×7): 500 mg via ORAL
  Filled 2011-05-15 (×7): qty 1

## 2011-05-15 MED ORDER — ACETAMINOPHEN 325 MG PO TABS
650.0000 mg | ORAL_TABLET | Freq: Four times a day (QID) | ORAL | Status: DC | PRN
  Administered 2011-05-16: 650 mg via ORAL
  Filled 2011-05-15: qty 2

## 2011-05-15 MED ORDER — ALBUTEROL SULFATE (5 MG/ML) 0.5% IN NEBU
2.5000 mg | INHALATION_SOLUTION | Freq: Four times a day (QID) | RESPIRATORY_TRACT | Status: DC
  Administered 2011-05-15 – 2011-05-19 (×16): 2.5 mg via RESPIRATORY_TRACT
  Filled 2011-05-15 (×17): qty 0.5

## 2011-05-15 MED ORDER — PANTOPRAZOLE SODIUM 40 MG PO TBEC
40.0000 mg | DELAYED_RELEASE_TABLET | Freq: Every day | ORAL | Status: DC
  Administered 2011-05-15 – 2011-05-21 (×7): 40 mg via ORAL
  Filled 2011-05-15 (×4): qty 1

## 2011-05-15 MED ORDER — MOXIFLOXACIN HCL IN NACL 400 MG/250ML IV SOLN
400.0000 mg | INTRAVENOUS | Status: DC
  Administered 2011-05-15 – 2011-05-19 (×5): 400 mg via INTRAVENOUS
  Filled 2011-05-15 (×5): qty 250

## 2011-05-15 MED ORDER — ACETAMINOPHEN 650 MG RE SUPP: 650.0000 mg | Freq: Four times a day (QID) | RECTAL | Status: DC | PRN

## 2011-05-15 MED ORDER — ASPIRIN 81 MG PO CHEW
81.0000 mg | CHEWABLE_TABLET | Freq: Every day | ORAL | Status: DC
  Administered 2011-05-15 – 2011-05-21 (×7): 81 mg via ORAL
  Filled 2011-05-15 (×5): qty 1

## 2011-05-15 MED ORDER — CHOLECALCIFEROL 10 MCG (400 UNIT) PO TABS
400.0000 [IU] | ORAL_TABLET | Freq: Every day | ORAL | Status: DC
  Administered 2011-05-15 – 2011-05-21 (×7): 400 [IU] via ORAL
  Filled 2011-05-15 (×7): qty 1

## 2011-05-15 MED ORDER — FERROUS SULFATE 325 (65 FE) MG PO TABS
325.0000 mg | ORAL_TABLET | Freq: Every day | ORAL | Status: DC
  Administered 2011-05-16 – 2011-05-21 (×6): 325 mg via ORAL
  Filled 2011-05-15 (×9): qty 1

## 2011-05-15 NOTE — ED Notes (Signed)
Pt. Undress on monitor place foley in.

## 2011-05-15 NOTE — H&P (Signed)
PCP:   Thayer Headings, MD, MD   Chief Complaint:  Shortness of breath, fever, generalized malaise; hypoxemia.  HPI: 78 year old female with a past medical history significant for diastolic congestive heart failure, hypertension, stage I chronic kidney disease, gastroesophageal reflux disease, history of cerebral aneurysm and weight SAH (S/P surgery); who came to the emergency department with a one-week history of increasing shortness of breath. Patient and her son reports that over the last week she had been having difficulty breathing, nonproductive cough, generalized malaise and decreased appetite. Symptoms have gotten worse over the last 48 hours and this morning she was bed tachypneic which was the main reason why her son call EMS in order to bring her to the emergency department; at the time the EMS arrived to the patient's house her respiratory rate was in the high 20s on her oxygen saturation was 85% on room air. Patient denies any chest pain, any abdominal pain, any vomiting/nausea, any headache, no hematochezia. Of note, patient reports having another son who was recently admitted to the hospital 2/2 PNA which can represents sick contact.  Allergies:  No Known Allergies    Past Medical History  Diagnosis Date  . CHF (congestive heart failure)   . Hypertension   . Aneurysm 1972    cerebral  . Acid reflux     occasional    Past Surgical History  Procedure Date  . Fracture surgery     Prior to Admission medications   Medication Sig Start Date End Date Taking? Authorizing Provider  amiloride-hydrochlorothiazide (MODURETIC) 5-50 MG tablet Take 0.5 tablets by mouth daily.   Yes Historical Provider, MD  aspirin 81 MG chewable tablet Chew 81 mg by mouth daily.   Yes Historical Provider, MD  Calcium Carbonate (CALCIUM 500 PO) Take 1 tablet by mouth daily.   Yes Historical Provider, MD  ferrous sulfate 325 (65 FE) MG tablet Take 325 mg by mouth daily with breakfast.   Yes Historical  Provider, MD  omeprazole (PRILOSEC) 20 MG capsule Take 20 mg by mouth daily as needed. For acid reflux   Yes Historical Provider, MD  potassium chloride SA (K-DUR,KLOR-CON) 20 MEQ tablet Take 20 mEq by mouth 2 (two) times daily.   Yes Historical Provider, MD  vitamin D, CHOLECALCIFEROL, 400 UNITS tablet Take 400 Units by mouth daily.   Yes Historical Provider, MD    Social History:  does not have a smoking history on file. She does not have any smokeless tobacco history on file. She reports that she does not drink alcohol or use illicit drugs.  History reviewed. No pertinent family history.  Review of Systems:  Complete review of systems negative except as mentioned on history of present illness.  Physical Exam: Blood pressure 114/51, pulse 88, temperature 101.1 F (38.4 C), temperature source Rectal, resp. rate 25, SpO2 95.00%. General: Alert awake oriented x3, on able to speak in full sentences saying: Married 2 shortness of breath; mild distress. HEENT: Head: Normocephalic and atraumatic. Ears: No erythema or bulging of her tympanic membranes bilaterally.  Eyes: PERRLA, extraocular muscles intact, no icterus, no nystagmus.  Mouth/throat: Oropharynx clear, no erythema or exudates. Nose: No discharges out of her nostrils, no polyps appreciated. Neck: No JVD, no thyromegaly, no bruits. Cardiovascular: S1 and S2, mild tachycardia, no murmurs or gallops, no rubs. Respiratory system: Tachypnea, no accessory muscle use such; positive rhonchi diffusely over her lung fields bilaterally and also positive rales at the basis. Abdomen: Soft, non-tenderness, no distention. Positive bowel sounds. Extremities: 1-2 ++  lower extremity edema bilaterally; no cyanosis. Neurological exam: Alert, awake and oriented x3; had I nerve 2-12 grossly intact, muscle strength 4/5 bilaterally symmetrically secondary to poor effort; no focal neurologic deficit appreciated. Ambulation was not obtained due to shortness  of breath. Skin: No rashes or petechiae his appreciated.  Labs on Admission:  Results for orders placed during the hospital encounter of 05/15/11 (from the past 48 hour(s))  POCT I-STAT TROPONIN I     Status: Normal   Collection Time   05/15/11  6:16 AM      Component Value Range Comment   Troponin i, poc 0.07  0.00 - 0.08 (ng/mL)    Comment 3            CARDIAC PANEL(CRET KIN+CKTOT+MB+TROPI)     Status: Normal   Collection Time   05/15/11  6:30 AM      Component Value Range Comment   Total CK 27  7 - 177 (U/L)    CK, MB 1.6  0.3 - 4.0 (ng/mL)    Troponin I <0.30  <0.30 (ng/mL)    Relative Index RELATIVE INDEX IS INVALID  0.0 - 2.5    BASIC METABOLIC PANEL     Status: Abnormal   Collection Time   05/15/11  6:30 AM      Component Value Range Comment   Sodium 138  135 - 145 (mEq/L)    Potassium 3.3 (*) 3.5 - 5.1 (mEq/L)    Chloride 101  96 - 112 (mEq/L)    CO2 26  19 - 32 (mEq/L)    Glucose, Bld 120 (*) 70 - 99 (mg/dL)    BUN 22  6 - 23 (mg/dL)    Creatinine, Ser 1.91  0.50 - 1.10 (mg/dL)    Calcium 9.2  8.4 - 10.5 (mg/dL)    GFR calc non Af Amer 60 (*) >90 (mL/min)    GFR calc Af Amer 69 (*) >90 (mL/min)   PRO B NATRIURETIC PEPTIDE     Status: Abnormal   Collection Time   05/15/11  6:30 AM      Component Value Range Comment   Pro B Natriuretic peptide (BNP) 1982.0 (*) 0 - 450 (pg/mL)   PROTIME-INR     Status: Normal   Collection Time   05/15/11  6:30 AM      Component Value Range Comment   Prothrombin Time 14.8  11.6 - 15.2 (seconds)    INR 1.14  0.00 - 1.49    CBC     Status: Abnormal   Collection Time   05/15/11  6:30 AM      Component Value Range Comment   WBC 27.2 (*) 4.0 - 10.5 (K/uL)    RBC 3.48 (*) 3.87 - 5.11 (MIL/uL)    Hemoglobin 10.4 (*) 12.0 - 15.0 (g/dL)    HCT 47.8 (*) 29.5 - 46.0 (%)    MCV 91.4  78.0 - 100.0 (fL)    MCH 29.9  26.0 - 34.0 (pg)    MCHC 32.7  30.0 - 36.0 (g/dL)    RDW 62.1  30.8 - 65.7 (%)    Platelets 306  150 - 400 (K/uL)     DIFFERENTIAL     Status: Abnormal   Collection Time   05/15/11  6:30 AM      Component Value Range Comment   Neutrophils Relative 84 (*) 43 - 77 (%)    Lymphocytes Relative 9 (*) 12 - 46 (%)    Monocytes Relative 7  3 - 12 (%)    Eosinophils Relative 0  0 - 5 (%)    Basophils Relative 0  0 - 1 (%)    Neutro Abs 22.9 (*) 1.7 - 7.7 (K/uL)    Lymphs Abs 2.4  0.7 - 4.0 (K/uL)    Monocytes Absolute 1.9 (*) 0.1 - 1.0 (K/uL)    Eosinophils Absolute 0.0  0.0 - 0.7 (K/uL)    Basophils Absolute 0.0  0.0 - 0.1 (K/uL)    WBC Morphology ATYPICAL LYMPHOCYTES      Smear Review LARGE PLATELETS PRESENT     LACTIC ACID, PLASMA     Status: Normal   Collection Time   05/15/11  6:32 AM      Component Value Range Comment   Lactic Acid, Venous 1.6  0.5 - 2.2 (mmol/L)   URINALYSIS, ROUTINE W REFLEX MICROSCOPIC     Status: Abnormal   Collection Time   05/15/11  8:37 AM      Component Value Range Comment   Color, Urine YELLOW  YELLOW     APPearance CLEAR  CLEAR     Specific Gravity, Urine 1.012  1.005 - 1.030     pH 5.5  5.0 - 8.0     Glucose, UA NEGATIVE  NEGATIVE (mg/dL)    Hgb urine dipstick SMALL (*) NEGATIVE     Bilirubin Urine NEGATIVE  NEGATIVE     Ketones, ur NEGATIVE  NEGATIVE (mg/dL)    Protein, ur NEGATIVE  NEGATIVE (mg/dL)    Urobilinogen, UA 0.2  0.0 - 1.0 (mg/dL)    Nitrite NEGATIVE  NEGATIVE     Leukocytes, UA NEGATIVE  NEGATIVE    URINE MICROSCOPIC-ADD ON     Status: Normal   Collection Time   05/15/11  8:37 AM      Component Value Range Comment   Squamous Epithelial / LPF RARE  RARE     WBC, UA 0-2  <3 (WBC/hpf)    RBC / HPF 3-6  <3 (RBC/hpf)    Bacteria, UA RARE  RARE     Urine-Other MUCOUS PRESENT       Radiological Exams on Admission: Dg Chest Portable 1 View  05/15/2011  *RADIOLOGY REPORT*  Clinical Data: Shortness of breath and fever.  PORTABLE CHEST - 1 VIEW  Comparison: Chest radiograph performed 08/29/2009  Findings: The lungs are well-aerated.  Vascular congestion is  noted, with mild bibasilar airspace opacities.  A small left pleural effusion is suspected.  Findings may reflect pulmonary edema, though pneumonia could have a similar appearance.  No pneumothorax is seen.  There is no evidence of pneumothorax.  The cardiomediastinal silhouette is borderline normal in size; calcification is noted within the aortic arch.  No acute osseous abnormalities are seen.  IMPRESSION: Vascular congestion, with mild bibasilar airspace opacities and small left pleural effusion.  Findings may reflect pulmonary edema or pneumonia.  Original Report Authenticated By: Tonia Ghent, M.D.     Assessment/Plan 1-SIRS (systemic inflammatory response syndrome): Most likely secondary to pneumonia; will check a strep urine antigen, Legionella urine antigen, blood cultures and urinalysis. Patient will be started on Avelox IV for community-acquired pneumonia. We'll provide supportive care and follow her symptoms. Will use part of consideration for her symptoms; unfortunately she has been having her symptoms for over a week now; so not really benefit of starting Tamiflu at this point.  2-Diastolic CHF, acute on chronic: Unable to see on file last 2-D echo; will repeat a 2-D echo, will follow cardiac  enzymes, the patient will be admitted to telemetry. We'll follow a straight eyes and nose, heart healthy diet, daily weights and will use Lasix as her blood pressure tolerated.  3-HTN (hypertension): See the patient have a soft blood pressure at this moment Will he'll her antihypertensive drugs.  4-CKD (chronic kidney disease), stage I: Is stable and at baseline; will follow renal function.  5-Leukocytosis: Secondary to problem #1. Will follow [employer] trend; continue antibiotics.  6-GERD (gastroesophageal reflux disease): Continue PPI.  7-Hypokalemia: Will replete her potassium, check a magnesium level, follow be made in a.m.  8-DVT: SCDs.    Time Spent on Admission: 50  minutes  Rashawnda Gaba [facility] [phone]  05/15/2011, 10:01 AM

## 2011-05-15 NOTE — Progress Notes (Signed)
Utilization Review Completed.Marcille Barman T1/16/2013   

## 2011-05-15 NOTE — ED Notes (Signed)
Son at bedside.

## 2011-05-15 NOTE — ED Notes (Signed)
NRB removed and pt placed on 2L [place] per Dr Patrica Duel.  Sats down to 93.  O2 increased to 3.5 L with rr of 22.  Per son, pt is breathing "easier" than she was at home.

## 2011-05-15 NOTE — ED Notes (Signed)
Pt with hx of chf, afib, pnx to ED c/o sob.   Pt lives with son and he went in the room to check on pt and felt she wasn't breathing well.  He called EMS.  85% on RA when EMS arrived and placed on NRB  With sats of 99%.  BIL pitting edema in lower extremities.

## 2011-05-15 NOTE — ED Notes (Signed)
Rhonchi bi-laterally and shallow breaths.  Pt has wound on rt heal that is in advanced stage of healing.  Per son, the wound drains and he has kept it wrapped also reports toenails are a problem and right greater toe has a non healing wound that is draining.  Removed bandgages and toe is red but no drainage, heel is a scab.  Pitting edema bilatterally in lower extremities with +2 pitting and painful to touch.  Son reports this has been increasing for several weeks.  Pt denies pain.  Son reports that pt is in wheelchair at home but denies any other disability.

## 2011-05-15 NOTE — ED Notes (Signed)
hospitalist at bedside

## 2011-05-15 NOTE — ED Notes (Signed)
First meeting patient. Patient resting with audible rhonchi. Patient remains on monitor and 3L oxygen with saturation of 94%. Patient's son at bedside.

## 2011-05-15 NOTE — ED Provider Notes (Signed)
History     CSN: [id number]  Arrival date & time 05/15/11  0608   First MD Initiated Contact with Patient 05/15/11 0617      Chief Complaint  Patient presents with  . Shortness of Breath    (Consider location/radiation/quality/duration/timing/severity/associated sxs/prior treatment) The history is provided by the patient, the EMS personnel and a relative.   Patient presents to the emergency department with a one-week history of increasing shortness of breath.  Her son noted that this morning she was having more difficulty breathing and called EMS.  Patient was found to be in respiratory distress by EMS and had a pulse ox of 85% on room air.  Patient denies chest pain, abdominal pain, vomiting/nausea, weakness, or headache.  Patient is able to give [place] a good history. No past medical history on file.  No past surgical history on file.  No family history on file.  History  Substance Use Topics  . Smoking status: Not on file  . Smokeless tobacco: Not on file  . Alcohol Use: Not on file    OB History    No data available      Review of Systems All pertinent positives and negatives reviewed in the history of present illness  Allergies  Review of patient's allergies indicates no known allergies.  Home Medications  No current outpatient prescriptions on file.  BP 113/58  Pulse 108  Resp 28  SpO2 100%  Physical Exam  Constitutional: She appears well-developed and well-nourished.  HENT:  Head: Normocephalic and atraumatic.  Mouth/Throat: Oropharynx is clear and moist. No oropharyngeal exudate.  Eyes: Pupils are equal, round, and reactive to light.  Neck: Normal range of motion. Neck supple.  Cardiovascular: Regular rhythm and normal heart sounds.  Tachycardia present.  Exam reveals no gallop and no friction rub.   No murmur heard. Pulmonary/Chest: No accessory muscle usage. Tachypnea noted. No apnea. No respiratory distress. She has rhonchi in the right upper field,  the right middle field, the right lower field, the left upper field, the left middle field and the left lower field. She has rales in the right upper field, the right middle field, the right lower field, the left upper field, the left middle field and the left lower field.  Abdominal: Soft. Bowel sounds are normal. She exhibits no distension. There is no tenderness.  Neurological: She is alert.  Skin: Skin is warm and dry. No rash noted.    ED Course  Procedures (including critical care time)   Labs Reviewed  POCT I-STAT TROPONIN I  CARDIAC PANEL(CRET KIN+CKTOT+MB+TROPI)  BASIC METABOLIC PANEL  I-STAT TROPONIN I  PRO B NATRIURETIC PEPTIDE  PROTIME-INR  CULTURE, BLOOD (ROUTINE X 2)  CULTURE, BLOOD (ROUTINE X 2)  CBC  DIFFERENTIAL  LACTIC ACID, PLASMA   Dg Chest Portable 1 View  05/15/2011  *RADIOLOGY REPORT*  Clinical Data: Shortness of breath and fever.  PORTABLE CHEST - 1 VIEW  Comparison: Chest radiograph performed 08/29/2009  Findings: The lungs are well-aerated.  Vascular congestion is noted, with mild bibasilar airspace opacities.  A small left pleural effusion is suspected.  Findings may reflect pulmonary edema, though pneumonia could have a similar appearance.  No pneumothorax is seen.  There is no evidence of pneumothorax.  The cardiomediastinal silhouette is borderline normal in size; calcification is noted within the aortic arch.  No acute osseous abnormalities are seen.  IMPRESSION: Vascular congestion, with mild bibasilar airspace opacities and small left pleural effusion.  Findings may reflect pulmonary edema  or pneumonia.  Original Report Authenticated By: Tonia Ghent, M.D.     Results for orders placed during the hospital encounter of 05/15/11  CARDIAC PANEL(CRET KIN+CKTOT+MB+TROPI)      Component Value Range   Total CK 27  7 - 177 (U/L)   CK, MB 1.6  0.3 - 4.0 (ng/mL)   Troponin I <0.30  <0.30 (ng/mL)   Relative Index RELATIVE INDEX IS INVALID  0.0 - 2.5   BASIC  METABOLIC PANEL      Component Value Range   Sodium 138  135 - 145 (mEq/L)   Potassium 3.3 (*) 3.5 - 5.1 (mEq/L)   Chloride 101  96 - 112 (mEq/L)   CO2 26  19 - 32 (mEq/L)   Glucose, Bld 120 (*) 70 - 99 (mg/dL)   BUN 22  6 - 23 (mg/dL)   Creatinine, Ser 6.57  0.50 - 1.10 (mg/dL)   Calcium 9.2  8.4 - 84.6 (mg/dL)   GFR calc non Af Amer 60 (*) >90 (mL/min)   GFR calc Af Amer 69 (*) >90 (mL/min)  PRO B NATRIURETIC PEPTIDE      Component Value Range   Pro B Natriuretic peptide (BNP) 1982.0 (*) 0 - 450 (pg/mL)  PROTIME-INR      Component Value Range   Prothrombin Time 14.8  11.6 - 15.2 (seconds)   INR 1.14  0.00 - 1.49   CBC      Component Value Range   WBC 27.2 (*) 4.0 - 10.5 (K/uL)   RBC 3.48 (*) 3.87 - 5.11 (MIL/uL)   Hemoglobin 10.4 (*) 12.0 - 15.0 (g/dL)   HCT 96.2 (*) 95.2 - 46.0 (%)   MCV 91.4  78.0 - 100.0 (fL)   MCH 29.9  26.0 - 34.0 (pg)   MCHC 32.7  30.0 - 36.0 (g/dL)   RDW 84.1  32.4 - 40.1 (%)   Platelets 306  150 - 400 (K/uL)  DIFFERENTIAL      Component Value Range   Neutrophils Relative 84 (*) 43 - 77 (%)   Lymphocytes Relative 9 (*) 12 - 46 (%)   Monocytes Relative 7  3 - 12 (%)   Eosinophils Relative 0  0 - 5 (%)   Basophils Relative 0  0 - 1 (%)   Neutro Abs 22.9 (*) 1.7 - 7.7 (K/uL)   Lymphs Abs 2.4  0.7 - 4.0 (K/uL)   Monocytes Absolute 1.9 (*) 0.1 - 1.0 (K/uL)   Eosinophils Absolute 0.0  0.0 - 0.7 (K/uL)   Basophils Absolute 0.0  0.0 - 0.1 (K/uL)   WBC Morphology ATYPICAL LYMPHOCYTES     Smear Review LARGE PLATELETS PRESENT    LACTIC ACID, PLASMA      Component Value Range   Lactic Acid, Venous 1.6  0.5 - 2.2 (mmol/L)  POCT I-STAT TROPONIN I      Component Value Range   Troponin i, poc 0.07  0.00 - 0.08 (ng/mL)   Comment 3                MDM   Date: 05/15/2011  [id number]  Rhythm: sinus tachycardia  QRS Axis: normal  Intervals: normal  ST/T Wave abnormalities: nonspecific T wave changes  Conduction Disutrbances:none  Narrative  Interpretation:   Old EKG Reviewed: The patient is tachy here today otherwise no significant changes.  MDM Reviewed: nursing note, vitals and previous chart Reviewed previous: labs, ECG and x-ray Interpretation: labs, ECG and x-ray Total time providing critical care: 30-74 minutes. This  excludes time spent performing separately reportable procedures and services. Consults: admitting MD   CRITICAL CARE Performed by: Carlyle Dolly   Total critical care time: 35  Critical care time was exclusive of separately billable procedures and treating other patients.  Critical care was necessary to treat or prevent imminent or life-threatening deterioration.  Critical care was time spent personally by me on the following activities: development of treatment plan with patient and/or surrogate as well as nursing, discussions with consultants, evaluation of patient's response to treatment, examination of patient, obtaining history from patient or surrogate, ordering and performing treatments and interventions, ordering and review of laboratory studies, ordering and review of radiographic studies, pulse oximetry and re-evaluation of patient's condition.    8:07 AM The nurse advised that the patient's blood pressure was 92 systolically that her pulse ox was covering in the upper 80s on 3 L.  Greg from a sore therapy within the right and then place her on 4 L nasal cannula and brought her pulse ox up to 93%.  Advised the nurse to gently give her some fluids it may be 50 cc an hour to help with her blood pressure.  She does not appear septic at this time she is oriented and able to answer questions appropriately.  Her lactic acid was 1.6 as well.       Carlyle Dolly, PA-C 05/15/11 0757  Carlyle Dolly, PA-C 05/15/11 [phone]

## 2011-05-15 NOTE — ED Notes (Signed)
X-ray at bedside

## 2011-05-15 NOTE — ED Notes (Signed)
Pt with diffuse rhonci and crackles.  BIL 1+ pitting edema.  Pt denies any chest pain.

## 2011-05-15 NOTE — ED Notes (Signed)
Report called to [facility]. Patient transfer to 47.

## 2011-05-16 ENCOUNTER — Inpatient Hospital Stay (HOSPITAL_COMMUNITY): Payer: Medicare Other

## 2011-05-16 LAB — CBC
MCHC: 33 g/dL (ref 30.0–36.0)
MCV: 91 fL (ref 78.0–100.0)
Platelets: 247 10*3/uL (ref 150–400)
RDW: 14 % (ref 11.5–15.5)
WBC: 25 10*3/uL — ABNORMAL HIGH (ref 4.0–10.5)

## 2011-05-16 LAB — BASIC METABOLIC PANEL
BUN: 35 mg/dL — ABNORMAL HIGH (ref 6–23)
CO2: 23 mEq/L (ref 19–32)
Calcium: 8.6 mg/dL (ref 8.4–10.5)
Creatinine, Ser: 1.35 mg/dL — ABNORMAL HIGH (ref 0.50–1.10)

## 2011-05-16 LAB — LEGIONELLA ANTIGEN, URINE

## 2011-05-16 LAB — STREP PNEUMONIAE URINARY ANTIGEN: Strep Pneumo Urinary Antigen: POSITIVE — AB

## 2011-05-16 MED ORDER — MAGNESIUM SULFATE 40 MG/ML IJ SOLN
2.0000 g | Freq: Once | INTRAMUSCULAR | Status: DC
  Filled 2011-05-16: qty 50

## 2011-05-16 MED ORDER — MAGNESIUM SULFATE 40 MG/ML IJ SOLN: 2.0000 g | Freq: Once | INTRAMUSCULAR | Status: DC

## 2011-05-16 MED ORDER — FUROSEMIDE 40 MG PO TABS
40.0000 mg | ORAL_TABLET | Freq: Every day | ORAL | Status: DC
  Administered 2011-05-16 – 2011-05-21 (×6): 40 mg via ORAL
  Filled 2011-05-16 (×6): qty 1

## 2011-05-16 MED ORDER — POTASSIUM CHLORIDE CRYS ER 20 MEQ PO TBCR
40.0000 meq | EXTENDED_RELEASE_TABLET | ORAL | Status: AC
  Administered 2011-05-16 (×3): 40 meq via ORAL
  Filled 2011-05-16: qty 2
  Filled 2011-05-16: qty 1
  Filled 2011-05-16: qty 2

## 2011-05-16 MED ORDER — MAGNESIUM SULFATE IN D5W 10-5 MG/ML-% IV SOLN
1.0000 g | Freq: Once | INTRAVENOUS | Status: AC
  Administered 2011-05-16: 1 g via INTRAVENOUS
  Filled 2011-05-16: qty 100

## 2011-05-16 NOTE — Clinical Documentation Improvement (Signed)
RESPIRATORY FAILURE DOCUMENTATION CLARIFICATION QUERY   THIS DOCUMENT IS NOT A PERMANENT PART OF THE MEDICAL RECORD  Please update your documentation within the medical record to reflect your response to this query.                                                                                     05/16/11  Dr. Gwenlyn Perking,  In a better effort to capture your patient's severity of illness, reflect appropriate length of stay and utilization of resources, a review of the patient medical record has revealed the following indicators.    Based on your clinical judgment, please document in the progress notes and discharge summary if a condition below provides greater specificity regarding the patient's respiratory status and treatment provided prior to admission:  - Acute Respiratory Failure, resolved  - Acute Respiratory Insufficiency, resolved  - Other Condition (please specify)  - Unable to Clinically Determine   Clinical Information:   "Symptoms have gotten worse over the last 48 hours and this morning she was bed tachypneic which was the main reason why her son call EMS in order to bring her to the emergency department; at the time the EMS arrived to the patient's house her respiratory rate was in the high 20s on her oxygen saturation was 85% on room air. Respiratory system: Tachypnea, no accessory muscle use such; positive rhonchi diffusely over her lung fields bilaterally and also positive rales at the bases." Lyndsie Wallman 05/15/2011, 10:01 AM  "Patient was found to be in respiratory distress by EMS and had a pulse ox of 85% on room air."  Carlyle Dolly, PA-C 05/15/11 0757  "Pt with hx of chf, afib, pnx to ED c/o sob. Pt lives with son and he went in the room to check on pt and felt she wasn't breathing well. He called EMS. 85% on RA when EMS arrived and placed on NRB With sats of 99%. BIL pitting edema in lower extremities."  ED Notes  Monico Hoar, RN  at 05/15/11  [phone]    In responding to this query please exercise your independent judgment.  The fact that a query is asked, does not imply that any particular answer is desired or expected.    Reviewed: Probably acute respiratory failure with hypoxia; improved with oxygen supplementation and lasix given in ED. By the time patient was seen by myself; she was not in resp distress.  Thank You,  Jerral Ralph  RN BSN Certified Clinical Documentation Specialist: Cell [phone]  Health Information Management [place]  TO RESPOND TO THE THIS QUERY, FOLLOW THE INSTRUCTIONS BELOW:  1. If needed, update documentation for the patient's encounter via the notes activity.  2. Access this query again and click edit on the In [employer].  3. After updating, or not, click F2 to complete all highlighted (required) fields concerning your review. Select "additional documentation in the medical record" OR "no additional documentation provided".  4. Click Sign note button.  5. The deficiency will fall out of your In Basket *Please let us know if you are not able to complete this workflow by phone or e-mail (listed below).

## 2011-05-16 NOTE — ED Provider Notes (Signed)
Medical screening examination/treatment/procedure(s) were conducted as a shared visit with non-physician practitioner(s) and myself.  I personally evaluated the patient during the encounter   Sarah Perkins A. Zilla Shartzer, MD 05/16/11 0128 

## 2011-05-16 NOTE — Progress Notes (Signed)
Subjective: Feeling better; denies CP, fever, N/V or any other acute complaints.  Objective: Vital signs in last 24 hours: Temp:  [97.3 F (36.3 C)-98.9 F (37.2 C)] 97.3 F (36.3 C) (01/17 1326) Pulse Rate:  [93-110] 106  (01/17 1326) Resp:  [18-24] 24  (01/17 1326) BP: (115-125)/(47-64) 125/64 mmHg (01/17 1326) SpO2:  [90 %-94 %] 90 % (01/17 1326) Weight:  [74.6 kg (164 lb 7.4 oz)] 74.6 kg (164 lb 7.4 oz) (01/17 0616) Weight change:  Last BM Date: 05/15/11  Intake/Output from previous day: 01/16 0701 - 01/17 0700 In: 905 [P.O.:320; I.V.:335; IV Piggyback:250] Out: 1775 [Urine:1775] Total I/O In: 120 [P.O.:120] Out: 100 [Urine:100]   Physical Exam: General: Alert, awake, oriented x3, in no acute distress. HEENT: No bruits, no goiter. Heart: s1 and S2; no rubs or gallops. Lungs: diffuse ronchi and mild base rales still presents. Abdomen: Soft, nontender, nondistended, positive bowel sounds. Extremities: No clubbing cyanosis or edema with positive pedal pulses. Neuro: Grossly intact, nonfocal.   Lab Results: Basic Metabolic Panel:  Basename 05/16/11 0643 05/15/11 1151  NA 139 139  K 3.2* 3.2*  CL 103 102  CO2 23 27  GLUCOSE 125* 134*  BUN 35* 26*  CREATININE 1.35* 1.11*  CALCIUM 8.6 8.8  MG -- 1.4*  PHOS -- 3.2   Liver Function Tests:  Basename 05/15/11 1151  AST 17  ALT 8  ALKPHOS 39  BILITOT 0.6  PROT 7.0  ALBUMIN 2.3*   CBC:  Basename 05/16/11 0643 05/15/11 0630  WBC 25.0* 27.2*  NEUTROABS -- 22.9*  HGB 9.0* 10.4*  HCT 27.3* 31.8*  MCV 91.0 91.4  PLT 247 306   Cardiac Enzymes:  Basename 05/15/11 0630  CKTOTAL 27  CKMB 1.6  CKMBINDEX --  TROPONINI <0.30   BNP:  Basename 05/15/11 0630  PROBNP 1982.0*   Thyroid Function Tests:  Basename 05/15/11 1151  TSH 0.736  T4TOTAL --  FREET4 --  T3FREE --  THYROIDAB --   Coagulation:  Basename 05/15/11 0630  LABPROT 14.8  INR 1.14    Misc. Labs:  Recent Results (from the past  240 hour(s))  CULTURE, BLOOD (ROUTINE X 2)     Status: Normal (Preliminary result)   Collection Time   05/15/11  6:20 AM      Component Value Range Status Comment   Specimen Description BLOOD RIGHT ARM   Final    Special Requests BOTTLES DRAWN AEROBIC AND ANAEROBIC 10CC   Final    Setup Time [id number]   Final    Culture     Final    Value:        BLOOD CULTURE RECEIVED NO GROWTH TO DATE CULTURE WILL BE HELD FOR 5 DAYS BEFORE ISSUING A FINAL NEGATIVE REPORT   Report Status PENDING   Incomplete   CULTURE, BLOOD (ROUTINE X 2)     Status: Normal (Preliminary result)   Collection Time   05/15/11  6:30 AM      Component Value Range Status Comment   Specimen Description BLOOD LEFT ARM   Final    Special Requests BOTTLES DRAWN AEROBIC ONLY 10CC   Final    Setup Time [id number]   Final    Culture     Final    Value:        BLOOD CULTURE RECEIVED NO GROWTH TO DATE CULTURE WILL BE HELD FOR 5 DAYS BEFORE ISSUING A FINAL NEGATIVE REPORT   Report Status PENDING   Incomplete   URINE CULTURE  Status: Normal   Collection Time   05/15/11  8:37 AM      Component Value Range Status Comment   Specimen Description URINE, RANDOM   Final    Special Requests NONE   Final    Setup Time [id number]   Final    Colony Count 2,000 COLONIES/ML   Final    Culture INSIGNIFICANT GROWTH   Final    Report Status 05/16/2011 FINAL   Final     Studies/Results: X-ray Chest Pa And Lateral   05/16/2011  *RADIOLOGY REPORT*  Clinical Data: Follow up shortness of breath  CHEST - 2 VIEW  Comparison: 05/15/2011  Findings: Heart size appears normal.  There are small bilateral pleural effusions and mild interstitial edema consistent with CHF.  Atelectasis versus infiltrate is identified within both lung bases.  IMPRESSION:  1.  Mild congestive heart failure. 2.  Decreased aeration to both lung bases.  Original Report Authenticated By: Rosealee Albee, M.D.   Dg Chest Portable 1 View  05/15/2011  *RADIOLOGY REPORT*   Clinical Data: Shortness of breath and fever.  PORTABLE CHEST - 1 VIEW  Comparison: Chest radiograph performed 08/29/2009  Findings: The lungs are well-aerated.  Vascular congestion is noted, with mild bibasilar airspace opacities.  A small left pleural effusion is suspected.  Findings may reflect pulmonary edema, though pneumonia could have a similar appearance.  No pneumothorax is seen.  There is no evidence of pneumothorax.  The cardiomediastinal silhouette is borderline normal in size; calcification is noted within the aortic arch.  No acute osseous abnormalities are seen.  IMPRESSION: Vascular congestion, with mild bibasilar airspace opacities and small left pleural effusion.  Findings may reflect pulmonary edema or pneumonia.  Original Report Authenticated By: Tonia Ghent, M.D.    Medications: Scheduled Meds:   . albuterol  2.5 mg Nebulization Q6H  . aspirin  81 mg Oral Daily  . calcium carbonate  1 tablet Oral Daily  . cholecalciferol  400 Units Oral Daily  . ferrous sulfate  325 mg Oral Q breakfast  . furosemide  40 mg Intravenous Once  . furosemide  40 mg Oral Daily  . ipratropium  0.5 mg Nebulization Q6H  . magnesium sulfate 1 - 4 g bolus IVPB  2 g Intravenous Once  . moxifloxacin  400 mg Intravenous Q24H  . pantoprazole  40 mg Oral Q1200  . potassium chloride  40 mEq Oral Q4H  . potassium chloride  40 mEq Oral Q4H   Continuous Infusions:   . sodium chloride 50 mL/hr at 05/15/11 1227   PRN Meds:.acetaminophen, acetaminophen, guaiFENesin-dextromethorphan, morphine, ondansetron (ZOFRAN) IV, ondansetron  Assessment/Plan: 1-SIRS (systemic inflammatory response syndrome); presumed to be PNA (CAP): improving; continue antibiotics. Follow cx which are still pending at this point. WBC's started to trends down. No fever today.  2-Diastolic CHF, acute on chronic: patient almost 1L negative; BP stable. Continue lasix but PO now. Daily weight and strict I's and O's.  3-HTN  (hypertension): stable. Continue holding antihypertensives drugs at this moment.  4-CKD (chronic kidney disease), stage I: essentially at baseline; probably a little worse with diuresis. Will change lasix to po. Follow BMET.  4-Leukocytosis:due to #1; continue antibiotics.  5-GERD (gastroesophageal reflux disease): continue PPI.  6-Hypokalemia: due to diuretics. Will replete.  7-low Magnesium: will replete.    LOS: 1 day   Chanese Hartsough [facility] [phone]  05/16/2011, 1:53 PM

## 2011-05-16 NOTE — Progress Notes (Signed)
  Echocardiogram 2D Echocardiogram has been performed.  Jorje Guild [facility] 05/16/2011, 10:42 AM

## 2011-05-16 NOTE — Progress Notes (Signed)
   CARE MANAGEMENT NOTE 05/16/2011  Patient:  MARKEE, REMLINGER   Account Number:  [id number]  Date Initiated:  05/16/2011  Documentation initiated by:  Junius Creamer  Subjective/Objective Assessment:   adm w resp distress     Action/Plan:   ? lives w son, pcp dr Thayer Headings   Anticipated DC Date:  05/19/2011   Anticipated DC Plan:  HOME W HOME HEALTH SERVICES  In-house referral  Clinical Social Worker      DC Planning Services  CM consult      Choice offered to / List presented to:             Status of service:   Medicare Important Message given?   (If response is "NO", the following Medicare IM given date fields will be blank) Date Medicare IM given:   Date Additional Medicare IM given:    Discharge Disposition:    Per UR Regulation:  Reviewed for med. necessity/level of care/duration of stay  Comments:  1/17  [place] charlie. pt lives w 2 sons. other son also in hospital and going to [facility] for rehab. son charlie not sure if pt will need snf for home w hhc. hx of [facility]. left my card. will follow and await phy ther rec. debbie Therasa Lorenzi rn,bsn [phone]

## 2011-05-17 ENCOUNTER — Inpatient Hospital Stay (HOSPITAL_COMMUNITY): Payer: Medicare Other

## 2011-05-17 MED ORDER — ENSURE IMMUNE HEALTH PO LIQD
237.0000 mL | Freq: Three times a day (TID) | ORAL | Status: DC
  Administered 2011-05-18 – 2011-05-21 (×9): 237 mL via ORAL

## 2011-05-17 NOTE — Progress Notes (Signed)
Subjective: Feeling better and breathing better. Denies CP, N/V or any other acute complaints. Afebrile  Objective: Vital signs in last 24 hours: Temp:  [98.4 F (36.9 C)-99.2 F (37.3 C)] 98.8 F (37.1 C) (01/18 1425) Pulse Rate:  [84-106] 84  (01/18 1425) Resp:  [16-24] 16  (01/18 1425) BP: (132-144)/(65-82) 132/82 mmHg (01/18 1425) SpO2:  [90 %-94 %] 93 % (01/18 1425) Weight:  [73.936 kg (163 lb)] 73.936 kg (163 lb) (01/18 0500) Weight change: -2.764 kg (-6 lb 1.5 oz) Last BM Date: 05/15/11  Intake/Output from previous day: 01/17 0701 - 01/18 0700 In: 650 [P.O.:400; IV Piggyback:250] Out: 120 [Urine:120] Total I/O In: 300 [P.O.:300] Out: -    Physical Exam: General: Alert, awake, oriented x3, in no acute distress. HEENT: No bruits, no goiter. Heart: s1 and S2; no rubs or gallops. Lungs: diffuse ronchi; no wheezing. Improve air movement Abdomen: Soft, nontender, nondistended, positive bowel sounds. Extremities: No clubbing cyanosis or edema with positive pedal pulses. Neuro: Grossly intact, nonfocal.   Lab Results: Basic Metabolic Panel:  Basename 05/16/11 0643 05/15/11 1151  NA 139 139  K 3.2* 3.2*  CL 103 102  CO2 23 27  GLUCOSE 125* 134*  BUN 35* 26*  CREATININE 1.35* 1.11*  CALCIUM 8.6 8.8  MG -- 1.4*  PHOS -- 3.2   Liver Function Tests:  Basename 05/15/11 1151  AST 17  ALT 8  ALKPHOS 39  BILITOT 0.6  PROT 7.0  ALBUMIN 2.3*   CBC:  Basename 05/16/11 0643 05/15/11 0630  WBC 25.0* 27.2*  NEUTROABS -- 22.9*  HGB 9.0* 10.4*  HCT 27.3* 31.8*  MCV 91.0 91.4  PLT 247 306   Cardiac Enzymes:  Basename 05/15/11 0630  CKTOTAL 27  CKMB 1.6  CKMBINDEX --  TROPONINI <0.30   BNP:  Basename 05/15/11 0630  PROBNP 1982.0*   Thyroid Function Tests:  Basename 05/15/11 1151  TSH 0.736  T4TOTAL --  FREET4 --  T3FREE --  THYROIDAB --   Coagulation:  Basename 05/15/11 0630  LABPROT 14.8  INR 1.14    Misc. Labs:  Recent Results (from  the past 240 hour(s))  CULTURE, BLOOD (ROUTINE X 2)     Status: Normal (Preliminary result)   Collection Time   05/15/11  6:20 AM      Component Value Range Status Comment   Specimen Description BLOOD RIGHT ARM   Final    Special Requests BOTTLES DRAWN AEROBIC AND ANAEROBIC 10CC   Final    Setup Time [id number]   Final    Culture     Final    Value:        BLOOD CULTURE RECEIVED NO GROWTH TO DATE CULTURE WILL BE HELD FOR 5 DAYS BEFORE ISSUING A FINAL NEGATIVE REPORT   Report Status PENDING   Incomplete   CULTURE, BLOOD (ROUTINE X 2)     Status: Normal (Preliminary result)   Collection Time   05/15/11  6:30 AM      Component Value Range Status Comment   Specimen Description BLOOD LEFT ARM   Final    Special Requests BOTTLES DRAWN AEROBIC ONLY 10CC   Final    Setup Time [id number]   Final    Culture     Final    Value:        BLOOD CULTURE RECEIVED NO GROWTH TO DATE CULTURE WILL BE HELD FOR 5 DAYS BEFORE ISSUING A FINAL NEGATIVE REPORT   Report Status PENDING   Incomplete   URINE  CULTURE     Status: Normal   Collection Time   05/15/11  8:37 AM      Component Value Range Status Comment   Specimen Description URINE, RANDOM   Final    Special Requests NONE   Final    Setup Time [id number]   Final    Colony Count 2,000 COLONIES/ML   Final    Culture INSIGNIFICANT GROWTH   Final    Report Status 05/16/2011 FINAL   Final   MRSA PCR SCREENING     Status: Normal   Collection Time   05/16/11  1:03 PM      Component Value Range Status Comment   MRSA by PCR NEGATIVE  NEGATIVE  Final     Studies/Results: X-ray Chest Pa And Lateral   05/16/2011  *RADIOLOGY REPORT*  Clinical Data: Follow up shortness of breath  CHEST - 2 VIEW  Comparison: 05/15/2011  Findings: Heart size appears normal.  There are small bilateral pleural effusions and mild interstitial edema consistent with CHF.  Atelectasis versus infiltrate is identified within both lung bases.  IMPRESSION:  1.  Mild congestive heart  failure. 2.  Decreased aeration to both lung bases.  Original Report Authenticated By: Rosealee Albee, M.D.    Medications: Scheduled Meds:    . albuterol  2.5 mg Nebulization Q6H  . aspirin  81 mg Oral Daily  . calcium carbonate  1 tablet Oral Daily  . cholecalciferol  400 Units Oral Daily  . ferrous sulfate  325 mg Oral Q breakfast  . furosemide  40 mg Oral Daily  . ipratropium  0.5 mg Nebulization Q6H  . magnesium sulfate 1 - 4 g bolus IVPB  1 g Intravenous Once  . moxifloxacin  400 mg Intravenous Q24H  . pantoprazole  40 mg Oral Q1200  . potassium chloride  40 mEq Oral Q4H   Continuous Infusions:  PRN Meds:.acetaminophen, acetaminophen, guaiFENesin-dextromethorphan, morphine, ondansetron (ZOFRAN) IV, ondansetron  Assessment/Plan: 1-SIRS (systemic inflammatory response syndrome); 2/2 strep PNA (CAP): Continue improving; will continue antibiotics. On admission patient with hypoxia and resp failure; which reversed properly with lasix and oxygen supplementation.  2-Diastolic CHF, acute on chronic: patient almost 1L negative; BP stable. Continue lasix PO; Daily weight and strict I's and O's. Patient on heart healthy diet.  3-HTN (hypertension): stable. Continue holding antihypertensives drugs at this moment.  4-CKD (chronic kidney disease), stage I: essentially at baseline; probably a little worse with diuresis. Stable. Will check BMET in am  4-Leukocytosis:due to #1; continue antibiotics.  5-GERD (gastroesophageal reflux disease): continue PPI.  6-Hypokalemia: Repleted will repeat BMET in am.  7-low Magnesium: Repleted.  8-Poor intake and low albumin: will start ensure   LOS: 2 days   Desean Heemstra [facility] [phone]  05/17/2011, 6:35 PM

## 2011-05-18 ENCOUNTER — Encounter (HOSPITAL_COMMUNITY): Payer: Self-pay | Admitting: *Deleted

## 2011-05-18 LAB — CBC
Hemoglobin: 8.8 g/dL — ABNORMAL LOW (ref 12.0–15.0)
MCHC: 33.1 g/dL (ref 30.0–36.0)
RBC: 2.95 MIL/uL — ABNORMAL LOW (ref 3.87–5.11)

## 2011-05-18 LAB — BASIC METABOLIC PANEL
CO2: 27 mEq/L (ref 19–32)
GFR calc non Af Amer: 46 mL/min — ABNORMAL LOW (ref >90)
Glucose, Bld: 107 mg/dL — ABNORMAL HIGH (ref 70–99)
Potassium: 3.1 mEq/L — ABNORMAL LOW (ref 3.5–5.1)
Sodium: 143 mEq/L (ref 135–145)

## 2011-05-18 MED ORDER — POTASSIUM CHLORIDE CRYS ER 20 MEQ PO TBCR
40.0000 meq | EXTENDED_RELEASE_TABLET | ORAL | Status: AC
  Administered 2011-05-18 – 2011-05-19 (×2): 40 meq via ORAL
  Filled 2011-05-18: qty 2

## 2011-05-18 MED ORDER — AMLODIPINE BESYLATE 5 MG PO TABS
5.0000 mg | ORAL_TABLET | Freq: Every day | ORAL | Status: DC
  Administered 2011-05-18 – 2011-05-21 (×4): 5 mg via ORAL
  Filled 2011-05-18 (×4): qty 1

## 2011-05-18 MED ORDER — POLYETHYLENE GLYCOL 3350 17 G PO PACK
17.0000 g | PACK | Freq: Every day | ORAL | Status: DC
  Administered 2011-05-18 – 2011-05-21 (×4): 17 g via ORAL
  Filled 2011-05-18 (×4): qty 1

## 2011-05-18 NOTE — Progress Notes (Signed)
Physical Therapy Evaluation Patient Details Name: Sarah Perkins MRN: [id number] DOB: [birth year] Today's Date: 05/18/2011  Problem List:  Patient Active Problem List  Diagnoses  . SIRS (systemic inflammatory response syndrome)  . Diastolic CHF, acute on chronic  . HTN (hypertension)  . CKD (chronic kidney disease), stage I  . Leukocytosis  . GERD (gastroesophageal reflux disease)  . Diverticulosis  . SAH (subarachnoid hemorrhage)  . Hypokalemia    Past Medical History:  Past Medical History  Diagnosis Date  . CHF (congestive heart failure)   . Hypertension   . Aneurysm 1972    cerebral  . Acid reflux     occasional   Past Surgical History:  Past Surgical History  Procedure Date  . Fracture surgery     PT Assessment/Plan/Recommendation PT Assessment Clinical Impression Statement: Patient is a 78 yo female admitetd with SIRS (pna, CHF).  Currently patient requires total assist for mobility and transfers (at w/c level pta.).  Recommend SNF at discharge for continued therapy to improve bed mobility and transfers so that son can manage patient at home. PT Recommendation/Assessment: Patient will need skilled PT in the acute care venue PT Problem List: Decreased strength;Decreased activity tolerance;Decreased balance;Decreased mobility;Decreased cognition;Cardiopulmonary status limiting activity PT Therapy Diagnosis : Generalized weakness;Altered mental status (Difficulty with transfers) PT Plan PT Frequency: Min 3X/week PT Treatment/Interventions: Functional mobility training;Therapeutic activities;Therapeutic exercise;Balance training;Cognitive remediation;Patient/family education PT Recommendation Follow Up Recommendations: Skilled nursing facility Equipment Recommended: Defer to next venue PT Goals  Acute Rehab PT Goals PT Goal Formulation: With patient/family Time For Goal Achievement: 2 weeks Pt will Roll Supine to Right Side: with min assist PT Goal: Rolling  Supine to Right Side - Progress: Goal set today Pt will Roll Supine to Left Side: with min assist PT Goal: Rolling Supine to Left Side - Progress: Goal set today Pt will go Supine/Side to Sit: with min assist;with HOB 0 degrees PT Goal: Supine/Side to Sit - Progress: Goal set today Pt will Sit at Edge of Bed: with supervision;6-10 min;with bilateral upper extremity support PT Goal: Sit at Edge Of Bed - Progress: Goal set today Pt will go Sit to Supine/Side: with min assist;with HOB 0 degrees PT Goal: Sit to Supine/Side - Progress: Goal set today Pt will Transfer Bed to Chair/Chair to Bed: with min assist PT Transfer Goal: Bed to Chair/Chair to Bed - Progress: Goal set today  PT Evaluation Precautions/Restrictions  Precautions Precautions: Fall Required Braces or Orthoses: No Restrictions Weight Bearing Restrictions: No Prior Functioning  Home Living Lives With: Son (One son cares for patient and her other son.) Receives Help From: Family Type of Home: House Home Layout: One level Home Access: Ramped entrance Bathroom Toilet: Standard Bathroom Accessibility: Yes How Accessible: Accessible via wheelchair Home Adaptive Equipment: Wheelchair - manual;Walker - rolling;Raised toilet seat with rails;Bedside commode/3-in-1 Prior Function Level of Independence: Needs assistance with ADLs;Needs assistance with homemaking;Needs assistance with tranfers Driving: No Vocation: Retired Comments: Patient at wheelchair level for at least 2 years pta.  Son helps patient with transfers. Cognition Cognition Arousal/Alertness: Awake/alert Overall Cognitive Status: History of cognitive impairments History of Cognitive Impairment: Decline in baseline functioning Attention: Impaired Current Attention Level: Sustained Attention - Other Comments: Needs redirection to task Orientation Level: Oriented to person;Disoriented to place;Disoriented to time;Disoriented to situation Following Commands:  Follows one step commands consistently;Follows multi-step commands inconsistently;Follows multi-step commands with increased time Problem Solving: Requires assistance for problem solving Sensation/Coordination Coordination Gross Motor Movements are Fluid and Coordinated: No Coordination and  Movement Description: Note tremors of UE's with tasks. Extremity Assessment RUE Assessment RUE Assessment: Within Functional Limits (General weakness) LUE Assessment LUE Assessment: Exceptions to [facility] (Decreased shoulder ROM; general weakness) RLE Assessment RLE Assessment:  (Grossly 3/5 strength) LLE Assessment LLE Assessment:  (Grossly 3/5 strength) Mobility (including Balance) Bed Mobility Bed Mobility: Yes Sitting - Scoot to Edge of Bed: 3: Mod assist Sitting - Scoot to Edge of Bed Details (indicate cue type and reason): Worked on scooting forward and backward in chair.  Required assist to maintain upright balance.  Cues and assist to scoot one hip at a time. Transfers Transfers: Yes Sit to Stand: 1: +1 Total assist;With upper extremity assist;With armrests;From chair/3-in-1 Sit to Stand Details (indicate cue type and reason): Worked in sitting to bring trunk forward over feet.  Attempted standing x 3 - unable to reach upright position.  Patient pushes posteriorly, even with cues to bring trunk forward over feet. Stand to Sit: 1: +1 Total assist;With upper extremity assist;With armrests;To chair/3-in-1 Stand to Sit Details: Total assist to slowly descend to chair. Ambulation/Gait Ambulation/Gait: No  Posture/Postural Control Posture/Postural Control: Postural limitations Postural Limitations: Patient with flexed thoracic spine and forward head, however hips unable to flex to 90 degrees in sitting (pushes into extension). Balance Balance Assessed: Yes Static Sitting Balance Static Sitting - Balance Support: Bilateral upper extremity supported;Feet supported Static Sitting - Level of Assistance:  3: Mod assist Static Sitting - Comment/# of Minutes: Initially required mod assist to remain upright.  Worked to bring trunk forward over hips.  Able to maintain balance with standby assist for 20 seconds. Exercise    End of Session PT - End of Session Equipment Utilized During Treatment: Gait belt Activity Tolerance: Patient limited by fatigue Patient left: in chair;with call bell in reach;with family/visitor present Nurse Communication: Mobility status for transfers General Behavior During Session: [facility] for tasks performed Cognition: Impaired  Vena Austria 05/18/2011, 6:31 PM

## 2011-05-18 NOTE — Progress Notes (Addendum)
Subjective: Feeling better and breathing better. Denies CP, N/V, no abdominal pain. Afebrile. Patient reports constipation  Objective: Vital signs in last 24 hours: Temp:  [98.6 F (37 C)-99.7 F (37.6 C)] 98.6 F (37 C) (01/19 1433) Pulse Rate:  [90-102] 99  (01/19 1433) Resp:  [15-22] 20  (01/19 1433) BP: (148-174)/(65-83) 166/83 mmHg (01/19 1433) SpO2:  [92 %-96 %] 92 % (01/19 1433) Weight:  [73.3 kg (161 lb 9.6 oz)] 73.3 kg (161 lb 9.6 oz) (01/19 0526) Weight change: -0.636 kg (-1 lb 6.5 oz) Last BM Date: 05/15/11  Intake/Output from previous day: 01/18 0701 - 01/19 0700 In: 600 [P.O.:600] Out: -  Total I/O In: 360 [P.O.:360] Out: -    Physical Exam: General: Alert, awake, oriented x3, in no acute distress. HEENT: No bruits, no goiter. Heart: s1 and S2; no rubs or gallops. Lungs: diffuse ronchi; no wheezing. Improve air movement Abdomen: Soft, nontender, nondistended, positive bowel sounds. Extremities: No clubbing cyanosis or edema with positive pedal pulses. Neuro: Grossly intact, nonfocal.  Lab Results: Basic Metabolic Panel:  Basename 05/18/11 0630 05/16/11 0643  NA 143 139  K 3.1* 3.2*  CL 105 103  CO2 27 23  GLUCOSE 107* 125*  BUN 35* 35*  CREATININE 1.03 1.35*  CALCIUM 8.4 8.6  MG -- --  PHOS -- --   CBC:  Basename 05/18/11 0630 05/16/11 0643  WBC 14.5* 25.0*  NEUTROABS -- --  HGB 8.8* 9.0*  HCT 26.6* 27.3*  MCV 90.2 91.0  PLT 298 247    Misc. Labs:  Recent Results (from the past 240 hour(s))  CULTURE, BLOOD (ROUTINE X 2)     Status: Normal (Preliminary result)   Collection Time   05/15/11  6:20 AM      Component Value Range Status Comment   Specimen Description BLOOD RIGHT ARM   Final    Special Requests BOTTLES DRAWN AEROBIC AND ANAEROBIC 10CC   Final    Setup Time [id number]   Final    Culture     Final    Value:        BLOOD CULTURE RECEIVED NO GROWTH TO DATE CULTURE WILL BE HELD FOR 5 DAYS BEFORE ISSUING A FINAL NEGATIVE REPORT    Report Status PENDING   Incomplete   CULTURE, BLOOD (ROUTINE X 2)     Status: Normal (Preliminary result)   Collection Time   05/15/11  6:30 AM      Component Value Range Status Comment   Specimen Description BLOOD LEFT ARM   Final    Special Requests BOTTLES DRAWN AEROBIC ONLY 10CC   Final    Setup Time [id number]   Final    Culture     Final    Value:        BLOOD CULTURE RECEIVED NO GROWTH TO DATE CULTURE WILL BE HELD FOR 5 DAYS BEFORE ISSUING A FINAL NEGATIVE REPORT   Report Status PENDING   Incomplete   URINE CULTURE     Status: Normal   Collection Time   05/15/11  8:37 AM      Component Value Range Status Comment   Specimen Description URINE, RANDOM   Final    Special Requests NONE   Final    Setup Time [id number]   Final    Colony Count 2,000 COLONIES/ML   Final    Culture INSIGNIFICANT GROWTH   Final    Report Status 05/16/2011 FINAL   Final   MRSA PCR SCREENING  Status: Normal   Collection Time   05/16/11  1:03 PM      Component Value Range Status Comment   MRSA by PCR NEGATIVE  NEGATIVE  Final     Studies/Results: Dg Chest 2 View  05/17/2011  *RADIOLOGY REPORT*  Clinical Data: Shortness of breath  CHEST - 2 VIEW  Comparison: 05/16/2011  Findings: Chronic interstitial markings.  No frank interstitial edema.  Bilateral lower lobe opacities, atelectasis versus pneumonia, with suspected left pleural effusion.  No pneumothorax.  The heart is top normal size.  Degenerative changes of the visualized thoracolumbar spine.  IMPRESSION: Bilateral lower lobe opacities, atelectasis versus pneumonia, with suspected left pleural effusion.  Chronic interstitial markings without frank interstitial edema.  Original Report Authenticated By: Charline Bills, M.D.    Medications: Scheduled Meds:    . albuterol  2.5 mg Nebulization Q6H  . aspirin  81 mg Oral Daily  . calcium carbonate  1 tablet Oral Daily  . cholecalciferol  400 Units Oral Daily  . feeding supplement  237 mL Oral  TID WC  . ferrous sulfate  325 mg Oral Q breakfast  . furosemide  40 mg Oral Daily  . ipratropium  0.5 mg Nebulization Q6H  . moxifloxacin  400 mg Intravenous Q24H  . pantoprazole  40 mg Oral Q1200  . polyethylene glycol  17 g Oral Daily  . potassium chloride  40 mEq Oral Q4H   Continuous Infusions:  PRN Meds:.acetaminophen, acetaminophen, guaiFENesin-dextromethorphan, morphine, ondansetron (ZOFRAN) IV, ondansetron  Assessment/Plan: 1-SIRS (systemic inflammatory response syndrome); 2/2 strep PNA (CAP): Continue steadily improving; will continue antibiotics for one more day, before we change it to by mouth To complete antibiotic regimen. Improved in oxygen saturation; will start trying to titrate down.  2-Diastolic CHF, acute on chronic: patient almost 1L negative; BP stable. Continue lasix PO; Daily weight and strict I's and O's. Patient on heart healthy diet.  3-HTN (hypertension): Will use amlodipine 5 mg by mouth daily.  4-CKD (chronic kidney disease), stage I: At baseline.  5-Leukocytosis:due to #1; continue antibiotics. Improving as expected with tx. Wbc's at 14,000 today.  6-GERD (gastroesophageal reflux disease): continue PPI.  7-Hypokalemia: Potassium low this morning. Will replete and check a magnesium level.   8-Poor intake and low albumin: Continue ensure 3 times a day.   LOS: 3 days   Zakeya Junker [facility] [phone]  05/18/2011, 5:50 PM

## 2011-05-19 LAB — BASIC METABOLIC PANEL
BUN: 33 mg/dL — ABNORMAL HIGH (ref 6–23)
Chloride: 107 mEq/L (ref 96–112)
GFR calc Af Amer: 57 mL/min — ABNORMAL LOW (ref >90)
Potassium: 3.8 mEq/L (ref 3.5–5.1)

## 2011-05-19 MED ORDER — MOXIFLOXACIN HCL 400 MG PO TABS
400.0000 mg | ORAL_TABLET | Freq: Every day | ORAL | Status: DC
  Administered 2011-05-20 (×2): 400 mg via ORAL
  Filled 2011-05-19 (×2): qty 1

## 2011-05-19 MED ORDER — DOCUSATE SODIUM 100 MG PO CAPS
100.0000 mg | ORAL_CAPSULE | Freq: Two times a day (BID) | ORAL | Status: DC
  Administered 2011-05-19 – 2011-05-21 (×4): 100 mg via ORAL
  Filled 2011-05-19 (×5): qty 1

## 2011-05-19 NOTE — Progress Notes (Signed)
Subjective: Feeling better, loose cough present today; no fever. He still without bowel movement. Per PT recommendation patient will require SNF placement.  Objective: Vital signs in last 24 hours: Temp:  [98.2 F (36.8 C)-98.8 F (37.1 C)] 98.2 F (36.8 C) (01/20 1421) Pulse Rate:  [97-103] 97  (01/20 1421) Resp:  [18-20] 19  (01/20 1421) BP: (134-145)/(64-73) 145/73 mmHg (01/20 1421) SpO2:  [92 %-99 %] 95 % (01/20 1451) Weight:  [73.5 kg (162 lb 0.6 oz)] 73.5 kg (162 lb 0.6 oz) (01/20 0419) Weight change: 0.2 kg (7.1 oz) Last BM Date: 05/15/11  Intake/Output from previous day: 01/19 0701 - 01/20 0700 In: 730 [P.O.:480; IV Piggyback:250] Out: -      Physical Exam: General: Alert, awake, oriented x3, in no acute distress. HEENT: No bruits, no goiter. Heart: s1 and S2; no rubs or gallops. Lungs: diffuse ronchi; no wheezing. Improved air movement Abdomen: Soft, nontender, nondistended, positive bowel sounds. Extremities: No clubbing cyanosis or edema with positive pedal pulses. Neuro: Grossly intact, nonfocal.  Lab Results: Basic Metabolic Panel:  Basename 05/19/11 1319 05/19/11 0500 05/18/11 0630  NA -- 143 143  K -- 3.8 3.1*  CL -- 107 105  CO2 -- 28 27  GLUCOSE -- 118* 107*  BUN -- 33* 35*  CREATININE -- 0.97 1.03  CALCIUM -- 8.4 8.4  MG 2.3 -- --  PHOS -- -- --   CBC:  Basename 05/18/11 0630  WBC 14.5*  NEUTROABS --  HGB 8.8*  HCT 26.6*  MCV 90.2  PLT 298    Misc. Labs:  Recent Results (from the past 240 hour(s))  CULTURE, BLOOD (ROUTINE X 2)     Status: Normal (Preliminary result)   Collection Time   05/15/11  6:20 AM      Component Value Range Status Comment   Specimen Description BLOOD RIGHT ARM   Final    Special Requests BOTTLES DRAWN AEROBIC AND ANAEROBIC 10CC   Final    Setup Time [id number]   Final    Culture     Final    Value:        BLOOD CULTURE RECEIVED NO GROWTH TO DATE CULTURE WILL BE HELD FOR 5 DAYS BEFORE ISSUING A FINAL  NEGATIVE REPORT   Report Status PENDING   Incomplete   CULTURE, BLOOD (ROUTINE X 2)     Status: Normal (Preliminary result)   Collection Time   05/15/11  6:30 AM      Component Value Range Status Comment   Specimen Description BLOOD LEFT ARM   Final    Special Requests BOTTLES DRAWN AEROBIC ONLY 10CC   Final    Setup Time [id number]   Final    Culture     Final    Value:        BLOOD CULTURE RECEIVED NO GROWTH TO DATE CULTURE WILL BE HELD FOR 5 DAYS BEFORE ISSUING A FINAL NEGATIVE REPORT   Report Status PENDING   Incomplete   URINE CULTURE     Status: Normal   Collection Time   05/15/11  8:37 AM      Component Value Range Status Comment   Specimen Description URINE, RANDOM   Final    Special Requests NONE   Final    Setup Time [id number]   Final    Colony Count 2,000 COLONIES/ML   Final    Culture INSIGNIFICANT GROWTH   Final    Report Status 05/16/2011 FINAL   Final   MRSA PCR SCREENING  Status: Normal   Collection Time   05/16/11  1:03 PM      Component Value Range Status Comment   MRSA by PCR NEGATIVE  NEGATIVE  Final     Studies/Results: Dg Chest 2 View  05/17/2011  *RADIOLOGY REPORT*  Clinical Data: Shortness of breath  CHEST - 2 VIEW  Comparison: 05/16/2011  Findings: Chronic interstitial markings.  No frank interstitial edema.  Bilateral lower lobe opacities, atelectasis versus pneumonia, with suspected left pleural effusion.  No pneumothorax.  The heart is top normal size.  Degenerative changes of the visualized thoracolumbar spine.  IMPRESSION: Bilateral lower lobe opacities, atelectasis versus pneumonia, with suspected left pleural effusion.  Chronic interstitial markings without frank interstitial edema.  Original Report Authenticated By: Charline Bills, M.D.    Medications: Scheduled Meds:    . albuterol  2.5 mg Nebulization Q6H  . amLODipine  5 mg Oral Daily  . aspirin  81 mg Oral Daily  . calcium carbonate  1 tablet Oral Daily  . cholecalciferol  400  Units Oral Daily  . docusate sodium  100 mg Oral BID  . feeding supplement  237 mL Oral TID WC  . ferrous sulfate  325 mg Oral Q breakfast  . furosemide  40 mg Oral Daily  . ipratropium  0.5 mg Nebulization Q6H  . moxifloxacin  400 mg Oral q1800  . pantoprazole  40 mg Oral Q1200  . polyethylene glycol  17 g Oral Daily  . potassium chloride  40 mEq Oral Q4H  . DISCONTD: moxifloxacin  400 mg Intravenous Q24H   Continuous Infusions:  PRN Meds:.acetaminophen, acetaminophen, guaiFENesin-dextromethorphan, morphine, ondansetron (ZOFRAN) IV, ondansetron  Assessment/Plan: 1-SIRS (systemic inflammatory response syndrome); 2/2 strep PNA (CAP): Will start weaning her oxygen down to room air; antibiotics will be transitioned to by mouth today. At this point for further rehabilitation patient will require SNF placement.  2-Diastolic CHF, acute on chronic: Compensated. Continue Lasix.  3-HTN (hypertension): Continue amlodipine 5 mg by mouth daily.  4-CKD (chronic kidney disease), stage I: At baseline.  5-Leukocytosis:due to #1; continue antibiotics. Improving as expected.  6-GERD (gastroesophageal reflux disease): continue PPI.  7-Hypokalemia: Repleted. Magnesium within normal limits.  8-Poor intake and low albumin: Continue ensure 3 times a day.  9-DVT: SCDs.  10-physical deconditioning: Patient will require placement for physical rehabilitation. SNF.   LOS: 4 days   Arpan Eskelson [facility] [phone]  05/19/2011, 5:35 PM

## 2011-05-20 MED ORDER — ALBUTEROL SULFATE (5 MG/ML) 0.5% IN NEBU
2.5000 mg | INHALATION_SOLUTION | Freq: Three times a day (TID) | RESPIRATORY_TRACT | Status: DC
  Administered 2011-05-20 – 2011-05-21 (×4): 2.5 mg via RESPIRATORY_TRACT
  Filled 2011-05-20 (×4): qty 0.5

## 2011-05-20 MED ORDER — IPRATROPIUM BROMIDE 0.02 % IN SOLN
0.5000 mg | Freq: Three times a day (TID) | RESPIRATORY_TRACT | Status: DC
  Administered 2011-05-20 – 2011-05-21 (×4): 0.5 mg via RESPIRATORY_TRACT
  Filled 2011-05-20 (×4): qty 2.5

## 2011-05-20 NOTE — Progress Notes (Addendum)
Subjective: No fever, no chest pain and improvement in her breathing. Positive bowel movement overnight. No abdominal pain or any other acute complaints.  Objective: Vital signs in last 24 hours: Temp:  [98.4 F (36.9 C)-98.8 F (37.1 C)] 98.8 F (37.1 C) (01/21 1347) Pulse Rate:  [86-96] 86  (01/21 1347) Resp:  [16-20] 16  (01/21 1347) BP: (124-158)/(62-76) 124/73 mmHg (01/21 1347) SpO2:  [90 %-95 %] 93 % (01/21 1516) Weight:  [72.7 kg (160 lb 4.4 oz)] 72.7 kg (160 lb 4.4 oz) (01/21 0424) Weight change: -0.8 kg (-1 lb 12.2 oz) Last BM Date: 05/15/11  Intake/Output from previous day: 01/20 0701 - 01/21 0700 In: 490 [P.O.:240; IV Piggyback:250] Out: -  Total I/O In: 360 [P.O.:360] Out: -    Physical Exam: General: Alert, awake, oriented x3, in no acute distress. HEENT: No bruits, no goiter. Heart: s1 and S2; no rubs or gallops. Lungs: diffuse ronchi; no wheezing. Improved air movement Abdomen: Soft, nontender, nondistended, positive bowel sounds. Extremities: No clubbing cyanosis or edema with positive pedal pulses. Neuro: Grossly intact, nonfocal.  Lab Results: Basic Metabolic Panel:  Basename 05/19/11 1319 05/19/11 0500 05/18/11 0630  NA -- 143 143  K -- 3.8 3.1*  CL -- 107 105  CO2 -- 28 27  GLUCOSE -- 118* 107*  BUN -- 33* 35*  CREATININE -- 0.97 1.03  CALCIUM -- 8.4 8.4  MG 2.3 -- --  PHOS -- -- --   CBC:  Basename 05/18/11 0630  WBC 14.5*  NEUTROABS --  HGB 8.8*  HCT 26.6*  MCV 90.2  PLT 298    Misc. Labs:  Recent Results (from the past 240 hour(s))  CULTURE, BLOOD (ROUTINE X 2)     Status: Normal (Preliminary result)   Collection Time   05/15/11  6:20 AM      Component Value Range Status Comment   Specimen Description BLOOD RIGHT ARM   Final    Special Requests BOTTLES DRAWN AEROBIC AND ANAEROBIC 10CC   Final    Setup Time [id number]   Final    Culture     Final    Value:        BLOOD CULTURE RECEIVED NO GROWTH TO DATE CULTURE WILL BE  HELD FOR 5 DAYS BEFORE ISSUING A FINAL NEGATIVE REPORT   Report Status PENDING   Incomplete   CULTURE, BLOOD (ROUTINE X 2)     Status: Normal (Preliminary result)   Collection Time   05/15/11  6:30 AM      Component Value Range Status Comment   Specimen Description BLOOD LEFT ARM   Final    Special Requests BOTTLES DRAWN AEROBIC ONLY 10CC   Final    Setup Time [id number]   Final    Culture     Final    Value:        BLOOD CULTURE RECEIVED NO GROWTH TO DATE CULTURE WILL BE HELD FOR 5 DAYS BEFORE ISSUING A FINAL NEGATIVE REPORT   Report Status PENDING   Incomplete   URINE CULTURE     Status: Normal   Collection Time   05/15/11  8:37 AM      Component Value Range Status Comment   Specimen Description URINE, RANDOM   Final    Special Requests NONE   Final    Setup Time [id number]   Final    Colony Count 2,000 COLONIES/ML   Final    Culture INSIGNIFICANT GROWTH   Final    Report Status 05/16/2011  FINAL   Final   MRSA PCR SCREENING     Status: Normal   Collection Time   05/16/11  1:03 PM      Component Value Range Status Comment   MRSA by PCR NEGATIVE  NEGATIVE  Final     Studies/Results: No results found.  Medications: Scheduled Meds:    . albuterol  2.5 mg Nebulization TID  . amLODipine  5 mg Oral Daily  . aspirin  81 mg Oral Daily  . calcium carbonate  1 tablet Oral Daily  . cholecalciferol  400 Units Oral Daily  . docusate sodium  100 mg Oral BID  . feeding supplement  237 mL Oral TID WC  . ferrous sulfate  325 mg Oral Q breakfast  . furosemide  40 mg Oral Daily  . ipratropium  0.5 mg Nebulization TID  . moxifloxacin  400 mg Oral q1800  . pantoprazole  40 mg Oral Q1200  . polyethylene glycol  17 g Oral Daily  . DISCONTD: albuterol  2.5 mg Nebulization Q6H  . DISCONTD: ipratropium  0.5 mg Nebulization Q6H   Continuous Infusions:  PRN Meds:.acetaminophen, acetaminophen, guaiFENesin-dextromethorphan, morphine, ondansetron (ZOFRAN) IV,  ondansetron  Assessment/Plan: 1-SIRS (systemic inflammatory response syndrome); 2/2 strep PNA (CAP): Continue weaning oxygen to room air, continue antibiotics by mouth. Waiting bed availability to discharge to nursing home.  2-Diastolic CHF, acute on chronic: Compensated. Continue Lasix.  3-HTN (hypertension): Continue amlodipine 5 mg by mouth daily.  4-CKD (chronic kidney disease), stage I: At baseline.  5-Leukocytosis:due to #1; continue antibiotics. Improving as expected.  6-GERD (gastroesophageal reflux disease): continue PPI.  7-Hypokalemia: Will check BMET in am.  8-Poor intake and low albumin: Continue ensure 3 times a day.  9-DVT: SCDs.  10-physical deconditioning: Patient will require SNF placement for physical rehabilitation.    LOS: 5 days   Johnel Yielding [facility] [phone]  05/20/2011, 6:27 PM

## 2011-05-20 NOTE — Progress Notes (Signed)
Clinical Social Worker completed the psychosocial assessment, which can be found in the shadow chart. CSW talked with patient and her son Shylin Keizer and they are both feel that short-term rehab will be good for strengthening and safety at home. Preferences are [facility] or [facility] as her son Chanetta Marshall is at [facility]. FL-2 completed and will be made available to MD for signature. Patient's clinical sent to both [facility] facilities. Contact made with facility RN liaison regarding bed request for [facility].  Genelle Bal, MSW, LCSW [phone]

## 2011-05-21 DIAGNOSIS — I5030 Unspecified diastolic (congestive) heart failure: Secondary | ICD-10-CM | POA: Diagnosis not present

## 2011-05-21 DIAGNOSIS — R0602 Shortness of breath: Secondary | ICD-10-CM | POA: Diagnosis not present

## 2011-05-21 DIAGNOSIS — K5900 Constipation, unspecified: Secondary | ICD-10-CM | POA: Diagnosis not present

## 2011-05-21 DIAGNOSIS — Z5189 Encounter for other specified aftercare: Secondary | ICD-10-CM | POA: Diagnosis not present

## 2011-05-21 DIAGNOSIS — J189 Pneumonia, unspecified organism: Secondary | ICD-10-CM | POA: Diagnosis not present

## 2011-05-21 DIAGNOSIS — K219 Gastro-esophageal reflux disease without esophagitis: Secondary | ICD-10-CM | POA: Diagnosis not present

## 2011-05-21 DIAGNOSIS — R6510 Systemic inflammatory response syndrome (SIRS) of non-infectious origin without acute organ dysfunction: Secondary | ICD-10-CM | POA: Diagnosis not present

## 2011-05-21 DIAGNOSIS — K5731 Diverticulosis of large intestine without perforation or abscess with bleeding: Secondary | ICD-10-CM | POA: Diagnosis not present

## 2011-05-21 DIAGNOSIS — E46 Unspecified protein-calorie malnutrition: Secondary | ICD-10-CM | POA: Diagnosis not present

## 2011-05-21 DIAGNOSIS — E876 Hypokalemia: Secondary | ICD-10-CM | POA: Diagnosis not present

## 2011-05-21 DIAGNOSIS — I509 Heart failure, unspecified: Secondary | ICD-10-CM | POA: Diagnosis not present

## 2011-05-21 DIAGNOSIS — B351 Tinea unguium: Secondary | ICD-10-CM | POA: Diagnosis not present

## 2011-05-21 DIAGNOSIS — D696 Thrombocytopenia, unspecified: Secondary | ICD-10-CM | POA: Diagnosis not present

## 2011-05-21 DIAGNOSIS — M79609 Pain in unspecified limb: Secondary | ICD-10-CM | POA: Diagnosis not present

## 2011-05-21 DIAGNOSIS — I119 Hypertensive heart disease without heart failure: Secondary | ICD-10-CM | POA: Diagnosis not present

## 2011-05-21 DIAGNOSIS — I5033 Acute on chronic diastolic (congestive) heart failure: Secondary | ICD-10-CM | POA: Diagnosis not present

## 2011-05-21 DIAGNOSIS — A419 Sepsis, unspecified organism: Secondary | ICD-10-CM | POA: Diagnosis not present

## 2011-05-21 LAB — BASIC METABOLIC PANEL
CO2: 27 mEq/L (ref 19–32)
Calcium: 9.2 mg/dL (ref 8.4–10.5)
Chloride: 107 mEq/L (ref 96–112)
Glucose, Bld: 119 mg/dL — ABNORMAL HIGH (ref 70–99)
Potassium: 3.2 mEq/L — ABNORMAL LOW (ref 3.5–5.1)
Sodium: 145 mEq/L (ref 135–145)

## 2011-05-21 LAB — CBC
Hemoglobin: 9.3 g/dL — ABNORMAL LOW (ref 12.0–15.0)
MCH: 29.7 pg (ref 26.0–34.0)
RBC: 3.13 MIL/uL — ABNORMAL LOW (ref 3.87–5.11)
WBC: 14.7 10*3/uL — ABNORMAL HIGH (ref 4.0–10.5)

## 2011-05-21 MED ORDER — MOXIFLOXACIN HCL 400 MG PO TABS: 400.0000 mg | ORAL_TABLET | Freq: Every day | ORAL | Status: AC

## 2011-05-21 MED ORDER — DSS 100 MG PO CAPS: 100.0000 mg | ORAL_CAPSULE | Freq: Two times a day (BID) | ORAL | Status: AC

## 2011-05-21 MED ORDER — POLYETHYLENE GLYCOL 3350 17 G PO PACK: 17.0000 g | PACK | Freq: Every day | ORAL | Status: AC

## 2011-05-21 MED ORDER — ENSURE IMMUNE HEALTH PO LIQD: 237.0000 mL | Freq: Three times a day (TID) | ORAL | Status: DC

## 2011-05-21 MED ORDER — ACETAMINOPHEN 325 MG PO TABS: 650.0000 mg | ORAL_TABLET | Freq: Four times a day (QID) | ORAL | Status: DC | PRN

## 2011-05-21 MED ORDER — AMLODIPINE BESYLATE 5 MG PO TABS: 5.0000 mg | ORAL_TABLET | Freq: Every day | ORAL | Status: DC

## 2011-05-21 MED ORDER — FUROSEMIDE 20 MG PO TABS: 20.0000 mg | ORAL_TABLET | Freq: Every day | ORAL | Status: DC

## 2011-05-21 MED ORDER — GUAIFENESIN-DM 100-10 MG/5ML PO SYRP: 5.0000 mL | ORAL_SOLUTION | ORAL | Status: AC | PRN

## 2011-05-21 NOTE — Progress Notes (Signed)
Physical Therapy Treatment Patient Details Name: Sarah Perkins MRN: [id number] DOB: Feb 19, 1918 Today's Date: 05/21/2011  PT Assessment/Plan  PT - Assessment/Plan PT Plan: Discharge plan remains appropriate PT Frequency: Min 3X/week Follow Up Recommendations: Skilled nursing facility Equipment Recommended: Defer to next venue PT Goals  Acute Rehab PT Goals PT Goal: Rolling Supine to Right Side - Progress: Progressing toward goal PT Goal: Rolling Supine to Left Side - Progress: Progressing toward goal PT Goal: Supine/Side to Sit - Progress: Progressing toward goal PT Transfer Goal: Bed to Chair/Chair to Bed - Progress: Progressing toward goal  PT Treatment Precautions/Restrictions  Precautions Precautions: Fall Required Braces or Orthoses: No Restrictions Weight Bearing Restrictions: No Mobility (including Balance) Bed Mobility Rolling Right: 4: Min assist;With rail Rolling Right Details (indicate cue type and reason): (A) to initiate & carryout rolling.  Cues for sequencing & technique.   Rolling Left: With rail;4: Min assist Rolling Left Details (indicate cue type and reason): (A) to initiate & carryout rolling.  Cues for sequencing & technique.  Left Sidelying to Sit: 1: +1 Total assist Left Sidelying to Sit Details (indicate cue type and reason): (A) to position bil LE's in flexed position & move over EOB & to lift shoulders/trunk to sitting upright.  Cues for sequencing, technique, placement/use of UE's.   Sitting - Scoot to Edge of Bed: 1: +1 Total assist Sitting - Scoot to Edge of Bed Details (indicate cue type and reason): use of draw pad to bring hips closer to EOB.  Cues for sequencing & technique.   Transfers Sit to Stand: 1: +1 Total assist;From bed;From chair/3-in-1;With upper extremity assist Sit to Stand Details (indicate cue type and reason): (A) to achieve standing, balance due to posterior lean, stabilize RW.  Cues to increase upright posture, shift wt  anteriorly.   Stand to Sit: 2: Max assist;To chair/3-in-1 Stand to Sit Details: (A) to control descent, locate armrests of chairs Stand Pivot Transfers: 2: Max assist Stand Pivot Transfer Details (indicate cue type and reason): (A) for balance due to posterior lean, management of RW Ambulation/Gait Ambulation/Gait: No Stairs: No Wheelchair Mobility Wheelchair Mobility: No  Cytogeneticist Standing - Balance Support: Bilateral upper extremity supported Static Standing - Level of Assistance: 3: Mod assist Static Standing - Comment/# of Minutes: (A) required due to pt leaning posteriorly.   Exercise  General Exercises - Lower Extremity Ankle Circles/Pumps: AAROM;Both;10 reps;Supine Long Arc Quad: Strengthening;AROM;10 reps;Both;Seated Heel Slides: AAROM;Strengthening;Both;10 reps;Supine End of Session PT - End of Session Equipment Utilized During Treatment: Gait belt Activity Tolerance: Patient tolerated treatment well Patient left: in chair;with call bell in reach General Behavior During Session: [facility] for tasks performed  Lara Mulch 05/21/2011, 1:02 PM [phone]

## 2011-05-21 NOTE — Progress Notes (Signed)
Mrs. Sarah Perkins is being discharged to [facility] for short-term rehab today. Her SNF preference is [facility], however they don't have any female bed today, but will transfer her to [facility] when a bed is available. This will allow the patient to be in the same facility as her son.  Patient and son Sarah Perkins advised and are fine with this plan. Discharge information faxed to facility. CSW facilitated discharge to SNF via ambulance transport.  Genelle Bal, MSW, LCSW [phone]

## 2011-05-21 NOTE — Discharge Summary (Signed)
Physician Discharge Summary  Patient ID: Sarah Perkins MRN: [id number] DOB/AGE: April 16, 1918 78 y.o.  Admit date: 05/15/2011 Discharge date: 05/21/2011  Primary Care Physician:  Thayer Headings, MD, MD   Discharge Diagnoses:   1-SIRS (systemic inflammatory response syndrome); do to step pneumo community-acquired pneumonia.  2-Diastolic CHF, acute on chronic 3-CKD (chronic kidney disease), stage I 4-Leukocytosis 5-GERD (gastroesophageal reflux disease) 6-Hypokalemia 7-Hypertension 8-poor nutrition and hypoalbuminemia. 9-constipation. 10-chronic anemia  Medication List  As of 05/21/2011 12:22 PM   STOP taking these medications         amiloride-hydrochlorothiazide 5-50 MG tablet         TAKE these medications         acetaminophen 325 MG tablet   Commonly known as: TYLENOL   Take 2 tablets (650 mg total) by mouth every 6 (six) hours as needed for pain or fever (or Fever >/= 101).      amLODipine 5 MG tablet   Commonly known as: NORVASC   Take 1 tablet (5 mg total) by mouth daily.      aspirin 81 MG chewable tablet   Chew 81 mg by mouth daily.      CALCIUM 500 PO   Take 1 tablet by mouth daily.      DSS 100 MG Caps   Take 100 mg by mouth 2 (two) times daily.      feeding supplement Liqd   Take 237 mLs by mouth 3 (three) times daily with meals.      ferrous sulfate 325 (65 FE) MG tablet   Take 325 mg by mouth daily with breakfast.      furosemide 20 MG tablet   Commonly known as: LASIX   Take 1 tablet (20 mg total) by mouth daily.      guaiFENesin-dextromethorphan 100-10 MG/5ML syrup   Commonly known as: ROBITUSSIN DM   Take 5 mLs by mouth every 4 (four) hours as needed for cough.      moxifloxacin 400 MG tablet   Commonly known as: AVELOX   Take 1 tablet (400 mg total) by mouth daily at 6 PM.      omeprazole 20 MG capsule   Commonly known as: PRILOSEC   Take 20 mg by mouth daily as needed. For acid reflux      polyethylene glycol packet   Commonly known as: MIRALAX / GLYCOLAX   Take 17 g by mouth daily. Hold for diarrhea      potassium chloride SA 20 MEQ tablet   Commonly known as: K-DUR,KLOR-CON   Take 20 mEq by mouth 2 (two) times daily.      vitamin D (CHOLECALCIFEROL) 400 UNITS tablet   Take 400 Units by mouth daily.             Disposition and Follow-up: Patient discharged in a stable and improved condition. At this point the plan is to send her home to [facility] a skilled nurse facility for physical rehabilitation process. Patient will continue taking her antibiotics by mouth over the next 5 days in order to complete antibiotic regimen. Patient will also require to a slowly weaning her oxygen off to room air after this infection while maintaining oxygen saturation above 90%. She will require followup appointment with her PCP over the next 10 days in order to look for resolution of her symptoms and also further adjustment of the medications for her chronic problems. Patient with a mild hypokalemia due to diuretics at the moment of discharge but place on daily  supplemental potassium and with normal limits of magnesium. Please recheck a be made in one to 2 days in order to follow on her electrolytes. Patient is otherwise symptomatic, and medical stable to be transfer to nursing home.  Consults:   None   Significant Diagnostic Studies:  Dg Chest Portable 1 View  05/15/2011  *RADIOLOGY REPORT*  Clinical Data: Shortness of breath and fever.  PORTABLE CHEST - 1 VIEW  Comparison: Chest radiograph performed 08/29/2009  Findings: The lungs are well-aerated.  Vascular congestion is noted, with mild bibasilar airspace opacities.  A small left pleural effusion is suspected.  Findings may reflect pulmonary edema, though pneumonia could have a similar appearance.  No pneumothorax is seen.  There is no evidence of pneumothorax.  The cardiomediastinal silhouette is borderline normal in size; calcification is noted within the aortic  arch.  No acute osseous abnormalities are seen.  IMPRESSION: Vascular congestion, with mild bibasilar airspace opacities and small left pleural effusion.  Findings may reflect pulmonary edema or pneumonia.  Original Report Authenticated By: Tonia Ghent, M.D.    Brief H and P: 78 year old female with a past medical history significant for diastolic congestive heart failure, hypertension, stage I chronic kidney disease, gastroesophageal reflux disease, history of cerebral aneurysm and weight SAH (S/P surgery); who came to the emergency department with a one-week history of increasing shortness of breath. Patient and her son reports that over the last week she had been having difficulty breathing, nonproductive cough, generalized malaise and decreased appetite. Symptoms have gotten worse over the last 48 hours and this morning she was bed tachypneic which was the main reason why her son call EMS in order to bring her to the emergency department; at the time the EMS arrived to the patient's house her respiratory rate was in the high 20s on her oxygen saturation was 85% on room air. Patient denies any chest pain, any abdominal pain, any vomiting/nausea, any headache, no hematochezia. Of note, patient reports having another son who was recently admitted to the hospital 2/2 PNA which can represents sick contact.    Hospital Course:  1-SIRS (systemic inflammatory response syndrome); 2/2 strep PNA (CAP): Continue weaning oxygen to room air, continue antibiotics by mouth. Will discharge to nursing home for physical rehab.   2-Diastolic CHF, acute on chronic: Compensated. Continue Lasix.   3-HTN (hypertension): Continue amlodipine 5 mg by mouth daily.   4-CKD (chronic kidney disease), stage I: At baseline.   5-Leukocytosis:due to #1; continue antibiotics. Improving as expected and trending down.   6-GERD (gastroesophageal reflux disease): continue PPI.   7-Hypokalemia: Mg WNL; potassium 3.3 due to  diuretics; will continue daily supplemental dose.  8-Poor intake and low albumin: Continue ensure 3 times a day.   9-physical deconditioning: Physical rehabilitation at SNF.  The rest of the patient medical problems remains stable throughout this hospitalization without requiring any medication adjustment. Patient will follow with PCP in order to look over her chronic problems and have her medication regimen adjusted as needed.   Time spent on Discharge: 45 minutes  Signed: Laquan Beier 05/21/2011, 12:22 PM

## 2011-05-22 DIAGNOSIS — M79609 Pain in unspecified limb: Secondary | ICD-10-CM | POA: Diagnosis not present

## 2011-05-22 DIAGNOSIS — B351 Tinea unguium: Secondary | ICD-10-CM | POA: Diagnosis not present

## 2011-05-23 DIAGNOSIS — R489 Unspecified symbolic dysfunctions: Secondary | ICD-10-CM | POA: Diagnosis not present

## 2011-05-23 DIAGNOSIS — K5900 Constipation, unspecified: Secondary | ICD-10-CM | POA: Diagnosis not present

## 2011-05-23 DIAGNOSIS — D509 Iron deficiency anemia, unspecified: Secondary | ICD-10-CM | POA: Diagnosis not present

## 2011-05-23 DIAGNOSIS — R404 Transient alteration of awareness: Secondary | ICD-10-CM | POA: Diagnosis not present

## 2011-05-23 DIAGNOSIS — K219 Gastro-esophageal reflux disease without esophagitis: Secondary | ICD-10-CM | POA: Diagnosis not present

## 2011-05-23 DIAGNOSIS — R0602 Shortness of breath: Secondary | ICD-10-CM | POA: Diagnosis not present

## 2011-05-23 DIAGNOSIS — N181 Chronic kidney disease, stage 1: Secondary | ICD-10-CM | POA: Diagnosis not present

## 2011-05-23 DIAGNOSIS — R6510 Systemic inflammatory response syndrome (SIRS) of non-infectious origin without acute organ dysfunction: Secondary | ICD-10-CM | POA: Diagnosis not present

## 2011-05-23 DIAGNOSIS — E46 Unspecified protein-calorie malnutrition: Secondary | ICD-10-CM | POA: Diagnosis not present

## 2011-05-23 DIAGNOSIS — R059 Cough, unspecified: Secondary | ICD-10-CM | POA: Diagnosis not present

## 2011-05-23 DIAGNOSIS — I5033 Acute on chronic diastolic (congestive) heart failure: Secondary | ICD-10-CM | POA: Diagnosis not present

## 2011-05-23 DIAGNOSIS — R062 Wheezing: Secondary | ICD-10-CM | POA: Diagnosis not present

## 2011-05-23 DIAGNOSIS — K5731 Diverticulosis of large intestine without perforation or abscess with bleeding: Secondary | ICD-10-CM | POA: Diagnosis not present

## 2011-05-23 DIAGNOSIS — N179 Acute kidney failure, unspecified: Secondary | ICD-10-CM | POA: Diagnosis not present

## 2011-05-23 DIAGNOSIS — J90 Pleural effusion, not elsewhere classified: Secondary | ICD-10-CM | POA: Diagnosis not present

## 2011-05-23 DIAGNOSIS — Z5189 Encounter for other specified aftercare: Secondary | ICD-10-CM | POA: Diagnosis not present

## 2011-05-23 DIAGNOSIS — I5030 Unspecified diastolic (congestive) heart failure: Secondary | ICD-10-CM | POA: Diagnosis not present

## 2011-05-23 DIAGNOSIS — R5381 Other malaise: Secondary | ICD-10-CM | POA: Diagnosis not present

## 2011-05-23 DIAGNOSIS — E876 Hypokalemia: Secondary | ICD-10-CM | POA: Diagnosis not present

## 2011-05-23 DIAGNOSIS — J189 Pneumonia, unspecified organism: Secondary | ICD-10-CM | POA: Diagnosis not present

## 2011-05-23 DIAGNOSIS — D72829 Elevated white blood cell count, unspecified: Secondary | ICD-10-CM | POA: Diagnosis not present

## 2011-05-23 DIAGNOSIS — R112 Nausea with vomiting, unspecified: Secondary | ICD-10-CM | POA: Diagnosis not present

## 2011-05-23 DIAGNOSIS — I10 Essential (primary) hypertension: Secondary | ICD-10-CM | POA: Diagnosis not present

## 2011-05-23 DIAGNOSIS — R599 Enlarged lymph nodes, unspecified: Secondary | ICD-10-CM | POA: Diagnosis not present

## 2011-05-28 DIAGNOSIS — I5033 Acute on chronic diastolic (congestive) heart failure: Secondary | ICD-10-CM | POA: Diagnosis not present

## 2011-05-28 DIAGNOSIS — K5900 Constipation, unspecified: Secondary | ICD-10-CM | POA: Diagnosis not present

## 2011-05-28 DIAGNOSIS — D509 Iron deficiency anemia, unspecified: Secondary | ICD-10-CM | POA: Diagnosis not present

## 2011-05-28 DIAGNOSIS — J189 Pneumonia, unspecified organism: Secondary | ICD-10-CM | POA: Diagnosis not present

## 2011-05-28 DIAGNOSIS — K2100 Gastro-esophageal reflux disease with esophagitis, without bleeding: Secondary | ICD-10-CM | POA: Diagnosis not present

## 2011-05-28 DIAGNOSIS — I10 Essential (primary) hypertension: Secondary | ICD-10-CM | POA: Diagnosis not present

## 2011-06-28 DIAGNOSIS — R404 Transient alteration of awareness: Secondary | ICD-10-CM | POA: Diagnosis not present

## 2011-06-28 DIAGNOSIS — R05 Cough: Secondary | ICD-10-CM | POA: Diagnosis not present

## 2011-06-28 DIAGNOSIS — E876 Hypokalemia: Secondary | ICD-10-CM | POA: Diagnosis not present

## 2011-06-28 DIAGNOSIS — N179 Acute kidney failure, unspecified: Secondary | ICD-10-CM | POA: Diagnosis not present

## 2011-06-28 DIAGNOSIS — I5033 Acute on chronic diastolic (congestive) heart failure: Secondary | ICD-10-CM | POA: Diagnosis not present

## 2011-06-28 DIAGNOSIS — K219 Gastro-esophageal reflux disease without esophagitis: Secondary | ICD-10-CM | POA: Diagnosis not present

## 2011-06-28 DIAGNOSIS — D72829 Elevated white blood cell count, unspecified: Secondary | ICD-10-CM | POA: Diagnosis not present

## 2011-06-28 DIAGNOSIS — N181 Chronic kidney disease, stage 1: Secondary | ICD-10-CM | POA: Diagnosis not present

## 2011-06-28 DIAGNOSIS — J189 Pneumonia, unspecified organism: Secondary | ICD-10-CM | POA: Diagnosis not present

## 2011-06-28 DIAGNOSIS — E46 Unspecified protein-calorie malnutrition: Secondary | ICD-10-CM | POA: Diagnosis not present

## 2011-06-28 DIAGNOSIS — R5381 Other malaise: Secondary | ICD-10-CM | POA: Diagnosis not present

## 2011-06-28 DIAGNOSIS — I10 Essential (primary) hypertension: Secondary | ICD-10-CM | POA: Diagnosis not present

## 2011-06-28 DIAGNOSIS — R0602 Shortness of breath: Secondary | ICD-10-CM | POA: Diagnosis not present

## 2011-06-28 DIAGNOSIS — J90 Pleural effusion, not elsewhere classified: Secondary | ICD-10-CM | POA: Diagnosis not present

## 2011-06-28 DIAGNOSIS — R6510 Systemic inflammatory response syndrome (SIRS) of non-infectious origin without acute organ dysfunction: Secondary | ICD-10-CM | POA: Diagnosis not present

## 2011-06-28 DIAGNOSIS — R489 Unspecified symbolic dysfunctions: Secondary | ICD-10-CM | POA: Diagnosis not present

## 2011-06-28 DIAGNOSIS — R112 Nausea with vomiting, unspecified: Secondary | ICD-10-CM | POA: Diagnosis not present

## 2011-06-28 DIAGNOSIS — R599 Enlarged lymph nodes, unspecified: Secondary | ICD-10-CM | POA: Diagnosis not present

## 2011-06-28 DIAGNOSIS — R062 Wheezing: Secondary | ICD-10-CM | POA: Diagnosis not present

## 2011-06-28 DIAGNOSIS — Z5189 Encounter for other specified aftercare: Secondary | ICD-10-CM | POA: Diagnosis not present

## 2011-07-17 ENCOUNTER — Other Ambulatory Visit: Payer: Self-pay

## 2011-07-17 ENCOUNTER — Inpatient Hospital Stay (HOSPITAL_COMMUNITY)
Admission: EM | Admit: 2011-07-17 | Discharge: 2011-07-23 | DRG: 193 | Disposition: A | Discharge status: Skilled Nursing Facility | Payer: Medicare Other | Attending: Internal Medicine | Admitting: Internal Medicine

## 2011-07-17 ENCOUNTER — Emergency Department (HOSPITAL_COMMUNITY): Payer: Medicare Other

## 2011-07-17 ENCOUNTER — Encounter (HOSPITAL_COMMUNITY): Payer: Self-pay | Admitting: Emergency Medicine

## 2011-07-17 ENCOUNTER — Other Ambulatory Visit (HOSPITAL_COMMUNITY): Payer: Self-pay | Admitting: Pharmacy Technician

## 2011-07-17 DIAGNOSIS — R599 Enlarged lymph nodes, unspecified: Secondary | ICD-10-CM | POA: Diagnosis not present

## 2011-07-17 DIAGNOSIS — N179 Acute kidney failure, unspecified: Secondary | ICD-10-CM | POA: Diagnosis present

## 2011-07-17 DIAGNOSIS — A0472 Enterocolitis due to Clostridium difficile, not specified as recurrent: Secondary | ICD-10-CM | POA: Diagnosis not present

## 2011-07-17 DIAGNOSIS — I509 Heart failure, unspecified: Secondary | ICD-10-CM | POA: Diagnosis present

## 2011-07-17 DIAGNOSIS — J189 Pneumonia, unspecified organism: Secondary | ICD-10-CM | POA: Diagnosis not present

## 2011-07-17 DIAGNOSIS — R0602 Shortness of breath: Secondary | ICD-10-CM | POA: Diagnosis not present

## 2011-07-17 DIAGNOSIS — D638 Anemia in other chronic diseases classified elsewhere: Secondary | ICD-10-CM | POA: Diagnosis present

## 2011-07-17 DIAGNOSIS — N181 Chronic kidney disease, stage 1: Secondary | ICD-10-CM | POA: Diagnosis present

## 2011-07-17 DIAGNOSIS — J90 Pleural effusion, not elsewhere classified: Secondary | ICD-10-CM | POA: Diagnosis not present

## 2011-07-17 DIAGNOSIS — D509 Iron deficiency anemia, unspecified: Secondary | ICD-10-CM | POA: Diagnosis present

## 2011-07-17 DIAGNOSIS — I10 Essential (primary) hypertension: Secondary | ICD-10-CM | POA: Diagnosis not present

## 2011-07-17 DIAGNOSIS — Z79899 Other long term (current) drug therapy: Secondary | ICD-10-CM

## 2011-07-17 DIAGNOSIS — K219 Gastro-esophageal reflux disease without esophagitis: Secondary | ICD-10-CM | POA: Diagnosis present

## 2011-07-17 DIAGNOSIS — D72829 Elevated white blood cell count, unspecified: Secondary | ICD-10-CM | POA: Diagnosis present

## 2011-07-17 DIAGNOSIS — I129 Hypertensive chronic kidney disease with stage 1 through stage 4 chronic kidney disease, or unspecified chronic kidney disease: Secondary | ICD-10-CM | POA: Diagnosis present

## 2011-07-17 DIAGNOSIS — I5033 Acute on chronic diastolic (congestive) heart failure: Secondary | ICD-10-CM | POA: Diagnosis present

## 2011-07-17 DIAGNOSIS — Z7982 Long term (current) use of aspirin: Secondary | ICD-10-CM

## 2011-07-17 DIAGNOSIS — R05 Cough: Secondary | ICD-10-CM | POA: Diagnosis not present

## 2011-07-17 HISTORY — DX: Tremor, unspecified: R25.1

## 2011-07-17 LAB — DIFFERENTIAL
Basophils Absolute: 0.1 10*3/uL (ref 0.0–0.1)
Basophils Relative: 0 % (ref 0–1)
Eosinophils Absolute: 0 10*3/uL (ref 0.0–0.7)
Eosinophils Relative: 0 % (ref 0–5)
Lymphocytes Relative: 10 % — ABNORMAL LOW (ref 12–46)
Lymphs Abs: 2.5 10*3/uL (ref 0.7–4.0)
Monocytes Absolute: 2.5 10*3/uL — ABNORMAL HIGH (ref 0.1–1.0)
Monocytes Relative: 10 % (ref 3–12)
Neutro Abs: 19.2 10*3/uL — ABNORMAL HIGH (ref 1.7–7.7)
Neutrophils Relative %: 79 % — ABNORMAL HIGH (ref 43–77)

## 2011-07-17 LAB — BASIC METABOLIC PANEL
BUN: 21 mg/dL (ref 6–23)
CO2: 28 mEq/L (ref 19–32)
Calcium: 9 mg/dL (ref 8.4–10.5)
Chloride: 101 mEq/L (ref 96–112)
Creatinine, Ser: 0.79 mg/dL (ref 0.50–1.10)
GFR calc Af Amer: 82 mL/min — ABNORMAL LOW (ref >90)
GFR calc non Af Amer: 71 mL/min — ABNORMAL LOW (ref >90)
Glucose, Bld: 119 mg/dL — ABNORMAL HIGH (ref 70–99)
Potassium: 3.7 mEq/L (ref 3.5–5.1)
Sodium: 137 mEq/L (ref 135–145)

## 2011-07-17 LAB — CBC
HCT: 30.2 % — ABNORMAL LOW (ref 36.0–46.0)
Hemoglobin: 9.9 g/dL — ABNORMAL LOW (ref 12.0–15.0)
MCH: 28.4 pg (ref 26.0–34.0)
MCHC: 32.8 g/dL (ref 30.0–36.0)
MCV: 86.5 fL (ref 78.0–100.0)
Platelets: 293 10*3/uL (ref 150–400)
RBC: 3.49 MIL/uL — ABNORMAL LOW (ref 3.87–5.11)
RDW: 14.5 % (ref 11.5–15.5)
WBC: 24.3 10*3/uL — ABNORMAL HIGH (ref 4.0–10.5)

## 2011-07-17 LAB — URINALYSIS, ROUTINE W REFLEX MICROSCOPIC
Bilirubin Urine: NEGATIVE
Glucose, UA: NEGATIVE mg/dL
Hgb urine dipstick: NEGATIVE
Ketones, ur: NEGATIVE mg/dL
Leukocytes, UA: NEGATIVE
Nitrite: NEGATIVE
Protein, ur: 30 mg/dL — AB
Specific Gravity, Urine: >1.046 — ABNORMAL HIGH (ref 1.005–1.030)
Urobilinogen, UA: 1 mg/dL (ref 0.0–1.0)
pH: 7 (ref 5.0–8.0)

## 2011-07-17 LAB — URINE MICROSCOPIC-ADD ON

## 2011-07-17 LAB — CARDIAC PANEL(CRET KIN+CKTOT+MB+TROPI)
CK, MB: 1.5 ng/mL (ref 0.3–4.0)
Relative Index: INVALID (ref 0.0–2.5)
Total CK: 45 U/L (ref 7–177)
Troponin I: <0.3 ng/mL

## 2011-07-17 LAB — TSH: TSH: 1.031 u[IU]/mL (ref 0.350–4.500)

## 2011-07-17 LAB — PROCALCITONIN: Procalcitonin: 0.89 ng/mL

## 2011-07-17 MED ORDER — SODIUM CHLORIDE 0.9 % IJ SOLN
3.0000 mL | Freq: Two times a day (BID) | INTRAMUSCULAR | Status: DC
  Administered 2011-07-18 – 2011-07-19 (×3): 3 mL via INTRAVENOUS

## 2011-07-17 MED ORDER — VANCOMYCIN HCL IN DEXTROSE 1-5 GM/200ML-% IV SOLN
1000.0000 mg | Freq: Once | INTRAVENOUS | Status: AC
  Administered 2011-07-17: 1000 mg via INTRAVENOUS
  Filled 2011-07-17: qty 200

## 2011-07-17 MED ORDER — GUAIFENESIN-DM 100-10 MG/5ML PO SYRP
5.0000 mL | ORAL_SOLUTION | ORAL | Status: DC | PRN
  Administered 2011-07-20 – 2011-07-21 (×6): 5 mL via ORAL
  Filled 2011-07-17 (×6): qty 10

## 2011-07-17 MED ORDER — SODIUM CHLORIDE 0.9 % IJ SOLN: 3.0000 mL | INTRAMUSCULAR | Status: DC | PRN

## 2011-07-17 MED ORDER — LEVOFLOXACIN IN D5W 750 MG/150ML IV SOLN
750.0000 mg | INTRAVENOUS | Status: DC
  Administered 2011-07-17 – 2011-07-20 (×4): 750 mg via INTRAVENOUS
  Filled 2011-07-17 (×5): qty 150

## 2011-07-17 MED ORDER — VANCOMYCIN HCL 500 MG IV SOLR
500.0000 mg | Freq: Two times a day (BID) | INTRAVENOUS | Status: DC
  Filled 2011-07-17 (×2): qty 500

## 2011-07-17 MED ORDER — SODIUM CHLORIDE 0.9 % IJ SOLN
3.0000 mL | Freq: Two times a day (BID) | INTRAMUSCULAR | Status: DC
  Administered 2011-07-17 – 2011-07-20 (×5): 3 mL via INTRAVENOUS

## 2011-07-17 MED ORDER — SODIUM CHLORIDE 0.9 % IV SOLN
250.0000 mL | INTRAVENOUS | Status: DC | PRN
  Administered 2011-07-19: 250 mL via INTRAVENOUS

## 2011-07-17 MED ORDER — ASPIRIN 81 MG PO CHEW
81.0000 mg | CHEWABLE_TABLET | Freq: Every day | ORAL | Status: DC
  Administered 2011-07-17 – 2011-07-23 (×7): 81 mg via ORAL
  Filled 2011-07-17 (×8): qty 1

## 2011-07-17 MED ORDER — PANTOPRAZOLE SODIUM 40 MG PO TBEC
40.0000 mg | DELAYED_RELEASE_TABLET | Freq: Every day | ORAL | Status: DC
  Administered 2011-07-17 – 2011-07-23 (×7): 40 mg via ORAL
  Filled 2011-07-17 (×8): qty 1

## 2011-07-17 MED ORDER — PIPERACILLIN-TAZOBACTAM 3.375 G IVPB 30 MIN
3.3750 g | Freq: Once | INTRAVENOUS | Status: DC
  Filled 2011-07-17: qty 50

## 2011-07-17 MED ORDER — PIPERACILLIN-TAZOBACTAM 3.375 G IVPB
3.3750 g | Freq: Three times a day (TID) | INTRAVENOUS | Status: DC
  Administered 2011-07-18: 3.375 g via INTRAVENOUS
  Filled 2011-07-17 (×5): qty 50

## 2011-07-17 MED ORDER — CALCIUM CARBONATE 1250 (500 CA) MG PO TABS
1250.0000 mg | ORAL_TABLET | Freq: Two times a day (BID) | ORAL | Status: DC
  Administered 2011-07-17 – 2011-07-23 (×12): 1250 mg via ORAL
  Filled 2011-07-17 (×16): qty 1

## 2011-07-17 MED ORDER — ONDANSETRON HCL 4 MG PO TABS: 4.0000 mg | ORAL_TABLET | Freq: Four times a day (QID) | ORAL | Status: DC | PRN

## 2011-07-17 MED ORDER — POTASSIUM CHLORIDE CRYS ER 20 MEQ PO TBCR
20.0000 meq | EXTENDED_RELEASE_TABLET | Freq: Two times a day (BID) | ORAL | Status: DC
  Administered 2011-07-17 – 2011-07-23 (×12): 20 meq via ORAL
  Filled 2011-07-17 (×15): qty 1

## 2011-07-17 MED ORDER — PIPERACILLIN-TAZOBACTAM 3.375 G IVPB
3.3750 g | Freq: Once | INTRAVENOUS | Status: DC
  Administered 2011-07-17: 3.375 g via INTRAVENOUS
  Filled 2011-07-17: qty 50

## 2011-07-17 MED ORDER — POLYETHYLENE GLYCOL 3350 17 G PO PACK
17.0000 g | PACK | Freq: Every day | ORAL | Status: DC
  Administered 2011-07-18 – 2011-07-20 (×3): 17 g via ORAL
  Filled 2011-07-17 (×5): qty 1

## 2011-07-17 MED ORDER — SACCHAROMYCES BOULARDII 250 MG PO CAPS
250.0000 mg | ORAL_CAPSULE | Freq: Two times a day (BID) | ORAL | Status: DC
  Administered 2011-07-17 – 2011-07-23 (×12): 250 mg via ORAL
  Filled 2011-07-17 (×17): qty 1

## 2011-07-17 MED ORDER — DOCUSATE SODIUM 100 MG PO CAPS
100.0000 mg | ORAL_CAPSULE | Freq: Two times a day (BID) | ORAL | Status: DC
  Administered 2011-07-17 – 2011-07-20 (×7): 100 mg via ORAL
  Filled 2011-07-17 (×9): qty 1

## 2011-07-17 MED ORDER — ALBUTEROL SULFATE (5 MG/ML) 0.5% IN NEBU: 2.5000 mg | INHALATION_SOLUTION | RESPIRATORY_TRACT | Status: DC | PRN

## 2011-07-17 MED ORDER — AMLODIPINE BESYLATE 5 MG PO TABS
5.0000 mg | ORAL_TABLET | Freq: Every day | ORAL | Status: DC
  Administered 2011-07-17 – 2011-07-23 (×7): 5 mg via ORAL
  Filled 2011-07-17 (×8): qty 1

## 2011-07-17 MED ORDER — IOHEXOL 300 MG/ML  SOLN
80.0000 mL | Freq: Once | INTRAMUSCULAR | Status: AC | PRN
  Administered 2011-07-17: 80 mL via INTRAVENOUS

## 2011-07-17 MED ORDER — ONDANSETRON HCL 4 MG/2ML IJ SOLN: 4.0000 mg | Freq: Four times a day (QID) | INTRAMUSCULAR | Status: DC | PRN

## 2011-07-17 MED ORDER — ENOXAPARIN SODIUM 30 MG/0.3ML ~~LOC~~ SOLN
30.0000 mg | SUBCUTANEOUS | Status: DC
  Administered 2011-07-17 – 2011-07-18 (×2): 30 mg via SUBCUTANEOUS
  Filled 2011-07-17 (×5): qty 0.3

## 2011-07-17 MED ORDER — FUROSEMIDE 20 MG PO TABS
20.0000 mg | ORAL_TABLET | Freq: Every day | ORAL | Status: DC
  Administered 2011-07-17 – 2011-07-23 (×7): 20 mg via ORAL
  Filled 2011-07-17 (×8): qty 1

## 2011-07-17 MED ORDER — ENSURE IMMUNE HEALTH PO LIQD
237.0000 mL | Freq: Three times a day (TID) | ORAL | Status: DC
  Filled 2011-07-17 (×5): qty 237

## 2011-07-17 MED ORDER — FERROUS SULFATE 325 (65 FE) MG PO TABS
325.0000 mg | ORAL_TABLET | Freq: Every day | ORAL | Status: DC
  Administered 2011-07-18 – 2011-07-23 (×6): 325 mg via ORAL
  Filled 2011-07-17 (×8): qty 1

## 2011-07-17 NOTE — ED Notes (Signed)
Patient transported to X-ray 

## 2011-07-17 NOTE — H&P (Signed)
PCP:  Thayer Headings, MD, MD   DOA:  07/17/2011 11:41 AM  Chief Complaint:  Shortness of breath  HPI: Pt is 78 yo female, resident of [facility] facility who was diagnosed with LLL PNA 03/19 and mild vascular congestion and was started on Avelox 400 mg PO QD and Lasix 20 mg BID. Today she presents to [facility] ED with main concern of progressively worsening SOB even with the regimen she was prescribed one day ago and now associated with productive cough of yellowish sputum. In addition, pt's sone reports that pt's symptoms initially started 3-4 days prior to admission and she has had occasional subjective fevers, chills, and poor oral intake. Pt denies any nausea, vomiting, any abdominal or urinary concerns.   Allergies: No Known Allergies  Prior to Admission medications   Medication Sig Start Date End Date Taking? Authorizing Provider  amLODipine (NORVASC) 5 MG tablet Take 1 tablet (5 mg total) by mouth daily. 05/21/11 05/20/12 Yes Vassie Loll, MD  aspirin 81 MG chewable tablet Chew 81 mg by mouth daily.   Yes Historical Provider, MD  Calcium Carbonate (CALCIUM 500 PO) Take 2 tablets by mouth daily.    Yes Historical Provider, MD  docusate sodium (COLACE) 100 MG capsule Take 100 mg by mouth 2 (two) times daily.   Yes Historical Provider, MD  feeding supplement (ENSURE IMMUNE HEALTH) LIQD Take 237 mLs by mouth 3 (three) times daily with meals. 05/21/11  Yes Vassie Loll, MD  ferrous sulfate 325 (65 FE) MG tablet Take 325 mg by mouth daily with breakfast.   Yes Historical Provider, MD  furosemide (LASIX) 20 MG tablet Take 1 tablet (20 mg total) by mouth daily. 05/21/11 05/20/12 Yes Vassie Loll, MD  geriatric multivitamins-minerals (ELDERTONIC/GEVRABON) ELIX Take 30 mLs by mouth daily.   Yes Historical Provider, MD  guaiFENesin-dextromethorphan (ROBITUSSIN DM) 100-10 MG/5ML syrup Take 5 mLs by mouth 4 (four) times daily as needed. As needed for cough   Yes Historical Provider, MD  moxifloxacin  (AVELOX) 400 MG tablet Take 400 mg by mouth daily.   Yes Historical Provider, MD  polyethylene glycol (MIRALAX / GLYCOLAX) packet Take 17 g by mouth daily. Give in 6-8 oz of fluids daily. Hold for diarrhea.   Yes Historical Provider, MD  potassium chloride SA (K-DUR,KLOR-CON) 20 MEQ tablet Take 20 mEq by mouth 2 (two) times daily.   Yes Historical Provider, MD  saccharomyces boulardii (FLORASTOR) 250 MG capsule Take 250 mg by mouth 2 (two) times daily.   Yes Historical Provider, MD  vitamin D, CHOLECALCIFEROL, 400 UNITS tablet Take 400 Units by mouth daily.   Yes Historical Provider, MD  acetaminophen (TYLENOL) 325 MG tablet Take 2 tablets (650 mg total) by mouth every 6 (six) hours as needed for pain or fever (or Fever >/= 101). 05/21/11 05/20/12  Vassie Loll, MD  omeprazole (PRILOSEC) 20 MG capsule Take 20 mg by mouth daily as needed. For acid reflux    Historical Provider, MD    Past Medical History  Diagnosis Date  . CHF (congestive heart failure)   . Hypertension   . Aneurysm 1972    cerebral  . Acid reflux     occasional  . Tremor     worse on right arm    Past Surgical History  Procedure Date  . Fracture surgery     Social History:  reports that she has never smoked. She has never used smokeless tobacco. She reports that she does not drink alcohol or use illicit drugs.  History reviewed. No pertinent family history.  Review of Systems:  Constitutional: Denies diaphoresis, weakness HEENT: Denies photophobia, eye pain, redness, hearing loss, ear pain, congestion, sore throat, rhinorrhea, sneezing, mouth sores, trouble swallowing, neck pain, neck stiffness and tinnitus.   Respiratory: Denies chest tightness,  and wheezing.   Cardiovascular: Denies palpitations and leg swelling.  Gastrointestinal: Denies nausea, vomiting, abdominal pain, diarrhea, constipation, blood in stool and abdominal distention.  Genitourinary: Denies dysuria, urgency, frequency, hematuria, flank pain  and difficulty urinating.  Musculoskeletal: Denies back pain, joint swelling, arthralgias and gait problem.  Skin: Denies pallor, rash and wound.  Neurological: Denies dizziness, seizures, syncope, weakness, light-headedness, numbness and headaches.  Hematological: Denies adenopathy. Easy bruising, personal or family bleeding history  Psychiatric/Behavioral: Denies confusion, nervousness, sleep disturbance and agitation  Physical Exam:  Filed Vitals:   07/17/11 1217 07/17/11 1549  BP: 132/60 142/69  Pulse: 101 104  Temp: 98.1 F (36.7 C)   TempSrc: Oral   Resp: 22   SpO2: 93% 93%    Constitutional: Vital signs reviewed. Pt is not in acute distress Head: Normocephalic and atraumatic Ear: TM normal bilaterally Mouth: no erythema or exudates, dry MM Eyes: PERRL, EOMI, conjunctivae normal, No scleral icterus.  Neck: Supple, Trachea midline normal ROM, No JVD, mass, thyromegaly, or carotid bruit present.  Cardiovascular: Regular rhythm but tachycardic, S1 normal, S2 normal, no MRG, pulses symmetric and intact bilaterally Pulmonary/Chest: Bilateral bibasilar crackles with scattered rhonchi, no wheezes, no rales Abdominal: Soft. Non-tender, non-distended, bowel sounds are normal, no masses, organomegaly, or guarding present.  GU: no CVA tenderness Musculoskeletal: No joint deformities, erythema, or stiffness, ROM full and no nontender Ext: no edema and no cyanosis, pulses palpable bilaterally (DP and PT) Hematology: no cervical, inginal, or axillary adenopathy.  Neurological: A&O to name, Strenght is normal and symmetric bilaterally, cranial nerve II-XII are grossly intact, no focal motor deficit, sensory intact to light touch bilaterally.  Skin: Warm, dry and intact. No rash, cyanosis, or clubbing.   Labs on Admission:  Results for orders placed during the hospital encounter of 07/17/11 (from the past 48 hour(s))  CBC     Status: Abnormal   Collection Time   07/17/11  1:10 PM       Component Value Range Comment   WBC 24.3 (*) 4.0 - 10.5 (K/uL)    RBC 3.49 (*) 3.87 - 5.11 (MIL/uL)    Hemoglobin 9.9 (*) 12.0 - 15.0 (g/dL)    HCT 16.1 (*) 09.6 - 46.0 (%)    MCV 86.5  78.0 - 100.0 (fL)    MCH 28.4  26.0 - 34.0 (pg)    MCHC 32.8  30.0 - 36.0 (g/dL)    RDW 04.5  40.9 - 81.1 (%)    Platelets 293  150 - 400 (K/uL)   DIFFERENTIAL     Status: Abnormal   Collection Time   07/17/11  1:10 PM      Component Value Range Comment   Neutrophils Relative 79 (*) 43 - 77 (%)    Neutro Abs 19.2 (*) 1.7 - 7.7 (K/uL)    Lymphocytes Relative 10 (*) 12 - 46 (%)    Lymphs Abs 2.5  0.7 - 4.0 (K/uL)    Monocytes Relative 10  3 - 12 (%)    Monocytes Absolute 2.5 (*) 0.1 - 1.0 (K/uL)    Eosinophils Relative 0  0 - 5 (%)    Eosinophils Absolute 0.0  0.0 - 0.7 (K/uL)    Basophils Relative 0  0 - 1 (%)    Basophils Absolute 0.1  0.0 - 0.1 (K/uL)   BASIC METABOLIC PANEL     Status: Abnormal   Collection Time   07/17/11  1:10 PM      Component Value Range Comment   Sodium 137  135 - 145 (mEq/L)    Potassium 3.7  3.5 - 5.1 (mEq/L)    Chloride 101  96 - 112 (mEq/L)    CO2 28  19 - 32 (mEq/L)    Glucose, Bld 119 (*) 70 - 99 (mg/dL)    BUN 21  6 - 23 (mg/dL)    Creatinine, Ser 1.61  0.50 - 1.10 (mg/dL)    Calcium 9.0  8.4 - 10.5 (mg/dL)    GFR calc non Af Amer 71 (*) >90 (mL/min)    GFR calc Af Amer 82 (*) >90 (mL/min)     Radiological Exams on Admission: CT CHEST: 1. Paratracheal lymphadenopathy accounts for plain film abnormality. Differential includes of lymphoproliferative disorder, reactive adenopathy, and less likely metastatic disease. Lymph nodes are present on comparison CT from 2010 but normal size at that time.  2. Consolidation in the bilateral lung bases along the oblique fissures. Differential includes infection and neoplasm.  Assessment/Plan  Principal Problem:  *PNA (pneumonia) - will admit to telemetry unit and monitor vitals per floor protocol - will also start broad  spectrum antibiotics as to treat as HCAP given the fact that pt lives at ALF - currently maintaining oxygen saturations > 95% on 2 L [place] - we can probably narrow abx in 24 hours if pt remains afebrile and WBC trending down - continue to provide supportive care, nebulizers prn and scheduled  Active Problems:  Diastolic CHF, acute on chronic - will continue low dose Lasix as renal function currently tolerating - there is mild congestion on physical exam and if that improves we can decrease the dose - check BNP    HTN (hypertension) - continue Lasix for now as noted above but hold if SBP < 90 mmHg   CKD (chronic kidney disease), stage I - creatinine remains stable and at pt's baseline - BMP in AM   Leukocytosis - likely related to principal problem - will continue abx as noted above - CBC in AM   GERD (gastroesophageal reflux disease) - continue PPI  Time Spent on Admission: Over 30 minutes  MAGICK-Lajeana Strough 07/17/2011, 5:53 PM  [facility], pager #: [phone] Main office number: [phone]

## 2011-07-17 NOTE — Progress Notes (Signed)
ANTIBIOTIC CONSULT NOTE - INITIAL  Pharmacy Consult for Vanc/Zosyn/Levaquin  Indication: suspected PNA   No Known Allergies  Patient Measurements:   Pt reported 140 lbs = 63.6 kg  Vital Signs: Temp: 98.1 F (36.7 C) (03/20 1217) Temp src: Oral (03/20 1217) BP: 142/69 mmHg (03/20 1549) Pulse Rate: 104  (03/20 1549) Intake/Output from previous day:   Intake/Output from this shift:    Labs:  Basename 07/17/11 1310  WBC 24.3*  HGB 9.9*  PLT 293  LABCREA --  CREATININE 0.79   The CrCl is unknown because both a height and weight (above a minimum accepted value) are required for this calculation. No results found for this basename: VANCOTROUGH:2,VANCOPEAK:2,VANCORANDOM:2,GENTTROUGH:2,GENTPEAK:2,GENTRANDOM:2,TOBRATROUGH:2,TOBRAPEAK:2,TOBRARND:2,AMIKACINPEAK:2,AMIKACINTROU:2,AMIKACIN:2, in the last 72 hours   Microbiology: No results found for this or any previous visit (from the past 720 hour(s)).  Medical History: Past Medical History  Diagnosis Date  . CHF (congestive heart failure)   . Hypertension   . Aneurysm 1972    cerebral  . Acid reflux     occasional  . Tremor     worse on right arm    Assessment:  89 YOF with leukocytosis, chest CT with consolidation in the bilateral lung bases to start Vanc, Zosyn and Levaquin for suspected HCAP.   No MD notes in chart at the moment  Scr 0.79, CrCl 54 ml/min , Normalized 52 ml/min  Pt received Vanc 1gm x 1 at 1700 in the ED   Goal of Therapy:  Vancomycin trough level 15-20 mcg/ml Appropriate renal dosing of Levaquin and Zosyn  Plan:   Levaquin 750 mg IV q24h  Zosyn 3.375 gm IV q8h, each dose infused over 4 hrs  Vancomycin 500 mg IV q12h.  First dose due 3/21 at 1400.    Pharmacy will f/u daily  Geoffry Paradise Thi 07/17/2011,6:01 PM

## 2011-07-17 NOTE — ED Notes (Signed)
Patient soiled upon arrival. Patient cleaned and repositioned

## 2011-07-17 NOTE — ED Provider Notes (Signed)
History     CSN: [id number]  Arrival date & time 07/17/11  1109   First MD Initiated Contact with Patient 07/17/11 1146      Chief Complaint  Patient presents with  . Cough    Pt from [facility]  brought by EMS per son request for cough x3 day and congestion CXR  pneumona started on Avelox  x7 days Pt is DNR    (Consider location/radiation/quality/duration/timing/severity/associated sxs/prior treatment) The history is provided by the patient.   Sarah Perkins is a 78 yo female, diagnosed on 07/16/2011 with lower left lobe pneumonitis and mild vascular congestion and started on Avelox 400 mg for 7 days and Lasix 20 mg BID, who presents to the ED today at her son's request for evaluation of her productive cough by "a MD in the ER."  Son states this has been going on for about three days and he states her mental status has not been the same.  Pt denies any fever, chills, N/V/D, night sweats, or weight changes recently.  Son is concerned that she may have not received adequate medication and evaluation of her medical conditions.    Past Medical History  Diagnosis Date  . CHF (congestive heart failure)   . Hypertension   . Aneurysm 1972    cerebral  . Acid reflux     occasional    Past Surgical History  Procedure Date  . Fracture surgery     No family history on file.  History  Substance Use Topics  . Smoking status: Never Smoker   . Smokeless tobacco: Never Used  . Alcohol Use: No    OB History    Grav Para Term Preterm Abortions TAB SAB Ect Mult Living                  Review of Systems All pertinent positives and negatives reviewed in the history of present illness  Allergies  Review of patient's allergies indicates no known allergies.  Home Medications   Current Outpatient Rx  Name Route Sig Dispense Refill  . ACETAMINOPHEN 325 MG PO TABS Oral Take 2 tablets (650 mg total) by mouth every 6 (six) hours as needed for pain or fever (or Fever >/= 101).     . AMLODIPINE BESYLATE 5 MG PO TABS Oral Take 1 tablet (5 mg total) by mouth daily.    . ASPIRIN 81 MG PO CHEW Oral Chew 81 mg by mouth daily.    Marland Kitchen CALCIUM 500 PO Oral Take 1 tablet by mouth daily.    Marland Kitchen ENSURE IMMUNE HEALTH PO LIQD Oral Take 237 mLs by mouth 3 (three) times daily with meals.    . FERROUS SULFATE 325 (65 FE) MG PO TABS Oral Take 325 mg by mouth daily with breakfast.    . FUROSEMIDE 20 MG PO TABS Oral Take 1 tablet (20 mg total) by mouth daily.    Marland Kitchen OMEPRAZOLE 20 MG PO CPDR Oral Take 20 mg by mouth daily as needed. For acid reflux    . POTASSIUM CHLORIDE CRYS ER 20 MEQ PO TBCR Oral Take 20 mEq by mouth 2 (two) times daily.    Marland Kitchen VITAMIN D 400 UNITS PO TABS Oral Take 400 Units by mouth daily.      BP 132/60  Pulse 101  Temp(Src) 98.1 F (36.7 C) (Oral)  Resp 22  SpO2 93%  Physical Exam  Constitutional: She is oriented to person, place, and time. She appears well-developed and well-nourished. No distress.  HENT:  Head: Normocephalic and atraumatic.  Mouth/Throat: Oropharynx is clear and moist. No oropharyngeal exudate.  Eyes: Pupils are equal, round, and reactive to light.  Neck: Normal range of motion.  Cardiovascular: Normal rate, regular rhythm, normal heart sounds and intact distal pulses.   No murmur heard. Pulmonary/Chest: Accessory muscle usage (In neck, not evident in ribs) present. No respiratory distress. She has no wheezes. She has rales (lower left lobe).  Abdominal: Soft. Bowel sounds are normal. She exhibits no distension. There is no tenderness.  Neurological: She is alert and oriented to person, place, and time.  Skin: Skin is warm. No rash noted. She is not diaphoretic. No erythema.    ED Course  Procedures (including critical care time)     Pt seen and assessed.  Will start IV fluids and order CXR Patient be admitted to the hospital for what seems to be a likely diagnosis of pneumonia.  MDM  MDM Reviewed: previous chart, nursing note and  vitals Interpretation: labs, x-ray and CT scan Consults: admitting MD    Date: 07/17/2011  Rate: 99 b/min  Rhythm: normal sinus rhythm  QRS Axis: normal  Intervals: normal  ST/T Wave abnormalities: normal  Conduction Disutrbances:none  Narrative Interpretation:   Old EKG Reviewed: unchanged          Carlyle Dolly, PA-C 07/17/11 2021  Carlyle Dolly, PA-C 07/19/11 [phone]

## 2011-07-18 ENCOUNTER — Other Ambulatory Visit: Payer: Self-pay

## 2011-07-18 DIAGNOSIS — R0602 Shortness of breath: Secondary | ICD-10-CM | POA: Diagnosis not present

## 2011-07-18 DIAGNOSIS — R6510 Systemic inflammatory response syndrome (SIRS) of non-infectious origin without acute organ dysfunction: Secondary | ICD-10-CM | POA: Diagnosis not present

## 2011-07-18 DIAGNOSIS — Z5189 Encounter for other specified aftercare: Secondary | ICD-10-CM | POA: Diagnosis not present

## 2011-07-18 DIAGNOSIS — A0472 Enterocolitis due to Clostridium difficile, not specified as recurrent: Secondary | ICD-10-CM | POA: Diagnosis not present

## 2011-07-18 DIAGNOSIS — J189 Pneumonia, unspecified organism: Secondary | ICD-10-CM | POA: Diagnosis not present

## 2011-07-18 DIAGNOSIS — I509 Heart failure, unspecified: Secondary | ICD-10-CM | POA: Diagnosis not present

## 2011-07-18 DIAGNOSIS — I10 Essential (primary) hypertension: Secondary | ICD-10-CM | POA: Diagnosis not present

## 2011-07-18 DIAGNOSIS — D72829 Elevated white blood cell count, unspecified: Secondary | ICD-10-CM | POA: Diagnosis present

## 2011-07-18 DIAGNOSIS — D638 Anemia in other chronic diseases classified elsewhere: Secondary | ICD-10-CM | POA: Diagnosis present

## 2011-07-18 DIAGNOSIS — Z7982 Long term (current) use of aspirin: Secondary | ICD-10-CM | POA: Diagnosis not present

## 2011-07-18 DIAGNOSIS — R489 Unspecified symbolic dysfunctions: Secondary | ICD-10-CM | POA: Diagnosis not present

## 2011-07-18 DIAGNOSIS — E876 Hypokalemia: Secondary | ICD-10-CM | POA: Diagnosis not present

## 2011-07-18 DIAGNOSIS — I5033 Acute on chronic diastolic (congestive) heart failure: Secondary | ICD-10-CM | POA: Diagnosis not present

## 2011-07-18 DIAGNOSIS — R112 Nausea with vomiting, unspecified: Secondary | ICD-10-CM | POA: Diagnosis not present

## 2011-07-18 DIAGNOSIS — Z79899 Other long term (current) drug therapy: Secondary | ICD-10-CM | POA: Diagnosis not present

## 2011-07-18 DIAGNOSIS — K219 Gastro-esophageal reflux disease without esophagitis: Secondary | ICD-10-CM | POA: Diagnosis present

## 2011-07-18 DIAGNOSIS — E46 Unspecified protein-calorie malnutrition: Secondary | ICD-10-CM | POA: Diagnosis not present

## 2011-07-18 DIAGNOSIS — D509 Iron deficiency anemia, unspecified: Secondary | ICD-10-CM | POA: Diagnosis present

## 2011-07-18 DIAGNOSIS — I129 Hypertensive chronic kidney disease with stage 1 through stage 4 chronic kidney disease, or unspecified chronic kidney disease: Secondary | ICD-10-CM | POA: Diagnosis not present

## 2011-07-18 DIAGNOSIS — N179 Acute kidney failure, unspecified: Secondary | ICD-10-CM | POA: Diagnosis not present

## 2011-07-18 DIAGNOSIS — N181 Chronic kidney disease, stage 1: Secondary | ICD-10-CM | POA: Diagnosis present

## 2011-07-18 LAB — BASIC METABOLIC PANEL
BUN: 19 mg/dL (ref 6–23)
CO2: 25 mEq/L (ref 19–32)
Calcium: 8.6 mg/dL (ref 8.4–10.5)
Chloride: 103 mEq/L (ref 96–112)
Creatinine, Ser: 0.75 mg/dL (ref 0.50–1.10)
GFR calc Af Amer: 83 mL/min — ABNORMAL LOW (ref >90)
GFR calc non Af Amer: 72 mL/min — ABNORMAL LOW (ref >90)
Glucose, Bld: 103 mg/dL — ABNORMAL HIGH (ref 70–99)
Potassium: 3.7 mEq/L (ref 3.5–5.1)
Sodium: 136 mEq/L (ref 135–145)

## 2011-07-18 LAB — CBC
HCT: 27 % — ABNORMAL LOW (ref 36.0–46.0)
MCV: 86.5 fL (ref 78.0–100.0)
Platelets: 288 10*3/uL (ref 150–400)
RBC: 3.12 MIL/uL — ABNORMAL LOW (ref 3.87–5.11)
WBC: 19.4 10*3/uL — ABNORMAL HIGH (ref 4.0–10.5)

## 2011-07-18 LAB — CARDIAC PANEL(CRET KIN+CKTOT+MB+TROPI)
Relative Index: INVALID (ref 0.0–2.5)
Total CK: 12 U/L (ref 7–177)

## 2011-07-18 LAB — MRSA PCR SCREENING: MRSA by PCR: POSITIVE — AB

## 2011-07-18 MED ORDER — IPRATROPIUM BROMIDE 0.02 % IN SOLN
0.5000 mg | Freq: Four times a day (QID) | RESPIRATORY_TRACT | Status: DC
  Administered 2011-07-19 (×3): 0.5 mg via RESPIRATORY_TRACT
  Filled 2011-07-18 (×3): qty 2.5

## 2011-07-18 MED ORDER — CHLORHEXIDINE GLUCONATE CLOTH 2 % EX PADS
6.0000 | MEDICATED_PAD | Freq: Every day | CUTANEOUS | Status: DC
  Administered 2011-07-19 – 2011-07-23 (×3): 6 via TOPICAL

## 2011-07-18 MED ORDER — ALBUTEROL SULFATE (5 MG/ML) 0.5% IN NEBU
2.5000 mg | INHALATION_SOLUTION | Freq: Four times a day (QID) | RESPIRATORY_TRACT | Status: DC
  Administered 2011-07-18 (×2): 2.5 mg via RESPIRATORY_TRACT
  Filled 2011-07-18 (×2): qty 0.5

## 2011-07-18 MED ORDER — ALBUTEROL SULFATE (5 MG/ML) 0.5% IN NEBU
2.5000 mg | INHALATION_SOLUTION | RESPIRATORY_TRACT | Status: DC | PRN
  Administered 2011-07-20: 2.5 mg via RESPIRATORY_TRACT
  Filled 2011-07-18: qty 0.5

## 2011-07-18 MED ORDER — MUPIROCIN 2 % EX OINT
1.0000 "application " | TOPICAL_OINTMENT | Freq: Two times a day (BID) | CUTANEOUS | Status: DC
  Administered 2011-07-18 – 2011-07-23 (×7): 1 via NASAL
  Filled 2011-07-18: qty 22

## 2011-07-18 MED ORDER — VANCOMYCIN HCL 500 MG IV SOLR
500.0000 mg | Freq: Two times a day (BID) | INTRAVENOUS | Status: DC
  Administered 2011-07-18 – 2011-07-21 (×6): 500 mg via INTRAVENOUS
  Filled 2011-07-18 (×7): qty 500

## 2011-07-18 MED ORDER — ENSURE COMPLETE PO LIQD
237.0000 mL | Freq: Three times a day (TID) | ORAL | Status: DC
  Administered 2011-07-19 – 2011-07-23 (×14): 237 mL via ORAL

## 2011-07-18 MED ORDER — ALBUTEROL SULFATE (5 MG/ML) 0.5% IN NEBU
2.5000 mg | INHALATION_SOLUTION | Freq: Four times a day (QID) | RESPIRATORY_TRACT | Status: DC
  Administered 2011-07-19 (×3): 2.5 mg via RESPIRATORY_TRACT
  Filled 2011-07-18 (×3): qty 0.5

## 2011-07-18 MED ORDER — PIPERACILLIN-TAZOBACTAM 3.375 G IVPB
3.3750 g | Freq: Three times a day (TID) | INTRAVENOUS | Status: DC
  Administered 2011-07-18 – 2011-07-21 (×8): 3.375 g via INTRAVENOUS
  Filled 2011-07-18 (×10): qty 50

## 2011-07-18 NOTE — ED Notes (Addendum)
Called pharmacy to send ensure to 5E

## 2011-07-18 NOTE — Evaluation (Signed)
Physical Therapy Evaluation Patient Details Name: Sarah Perkins MRN: [id number] DOB: Dec 30, 1917 Today's Date: 07/18/2011  Problem List:  Patient Active Problem List  Diagnoses  . SIRS (systemic inflammatory response syndrome)  . Diastolic CHF, acute on chronic  . HTN (hypertension)  . CKD (chronic kidney disease), stage I  . Leukocytosis  . GERD (gastroesophageal reflux disease)  . Diverticulosis  . SAH (subarachnoid hemorrhage)  . Hypokalemia  . PNA (pneumonia)    Past Medical History:  Past Medical History  Diagnosis Date  . CHF (congestive heart failure)   . Hypertension   . Aneurysm 1972    cerebral  . Acid reflux     occasional  . Tremor     worse on right arm   Past Surgical History:  Past Surgical History  Procedure Date  . Fracture surgery     PT Assessment/Plan/Recommendation PT Assessment Clinical Impression Statement: Pt admitted for LLL pneumonia.  Pt was previoulsy receiving rehab at [facility].  Pt would benefit from acute PT services in order to improve activity tolerance and increase strength to become more independent with bed mobility and transfers. PT Recommendation/Assessment: Patient will need skilled PT in the acute care venue PT Problem List: Decreased strength;Decreased activity tolerance;Decreased balance;Decreased mobility;Decreased knowledge of use of DME;Decreased safety awareness PT Therapy Diagnosis : Generalized weakness PT Plan PT Frequency: Min 3X/week PT Treatment/Interventions: DME instruction;Functional mobility training;Therapeutic activities;Wheelchair mobility training;Patient/family education;Therapeutic exercise;Balance training;Neuromuscular re-education PT Recommendation Follow Up Recommendations: Skilled nursing facility Equipment Recommended: Defer to next venue PT Goals  Acute Rehab PT Goals PT Goal Formulation: With patient Time For Goal Achievement: 2 weeks Pt will go Supine/Side to Sit: with min  assist PT Goal: Supine/Side to Sit - Progress: Goal set today Pt will Sit at [facility] of Bed: with min assist PT Goal: Sit at Edge Of Bed - Progress: Goal set today Pt will go Sit to Supine/Side: with min assist PT Goal: Sit to Supine/Side - Progress: Goal set today Pt will go Sit to Stand: with min assist PT Goal: Sit to Stand - Progress: Goal set today Pt will go Stand to Sit: with min assist PT Goal: Stand to Sit - Progress: Goal set today Pt will Transfer Bed to Chair/Chair to Bed: with min assist PT Transfer Goal: Bed to Chair/Chair to Bed - Progress: Goal set today Pt will Perform Home Exercise Program: with supervision, verbal cues required/provided PT Goal: Perform Home Exercise Program - Progress: Goal set today  PT Evaluation Precautions/Restrictions  Precautions Precautions: Fall Prior Functioning  Home Living Additional Comments: Pt came from [facility] for rehab.  Pt normally lives with son. Prior Function Level of Independence: Requires assistive device for independence;Needs assistance with tranfers;Needs assistance with ADLs Comments: Son reports minA required at times for bed mobility and transfers prior to going to rehab. Cognition Cognition Arousal/Alertness: Awake/alert Overall Cognitive Status: Appears within functional limits for tasks assessed Sensation/Coordination   Extremity Assessment RLE Strength RLE Overall Strength Comments: grossly 2+ to 3/5 throughout LLE Strength LLE Overall Strength Comments: grossly 2+ to 3/5 throughout Mobility (including Balance) Bed Mobility Bed Mobility: Yes Supine to Sit: Patient percentage (comment);1: +2 Total assist Supine to Sit Details (indicate cue type and reason): pt=30%, pt with posterior lean requiring verbal cues and manual assist for trunk to perform upright position Transfers Transfers: Yes Sit to Stand: 1: +2 Total assist;Patient percentage (comment);From elevated surface;From bed Sit to Stand Details  (indicate cue type and reason): pt=20%, increased posterior lean with  standing requiring increased assist for balance even with verbal cues to correct Stand to Sit: 1: +2 Total assist;Patient percentage (comment);To chair/3-in-1 Stand to Sit Details: pt=30%, assist to control descent Stand Pivot Transfers: 1: +2 Total assist;Patient percentage (comment) Stand Pivot Transfer Details (indicate cue type and reason): pt=35%, pt held therapists and techs forearms for support Ambulation/Gait Ambulation/Gait: No (pt nonambulatory)  Balance Balance Assessed: Yes Static Sitting Balance Static Sitting - Balance Support: Bilateral upper extremity supported;Feet supported Static Sitting - Level of Assistance: 3: Mod assist Static Sitting - Comment/# of Minutes: increased posterior trunk lean upon sitting required modA, verbal cues for hand placement and forward trunk lean requiring approx 2 minutes until pt could sit upright min/guard Exercise    End of Session PT - End of Session Equipment Utilized During Treatment: Gait belt Activity Tolerance: Patient limited by fatigue Patient left: in chair;with call bell in reach;with family/visitor present;Other (comment) (with RN) General Behavior During Session: [facility] for tasks performed Cognition: [facility] for tasks performed  Jasie Meleski,KATHrine E 07/18/2011, 3:23 PM Pager: [phone]

## 2011-07-18 NOTE — Plan of Care (Signed)
Problem: Phase I Progression Outcomes Goal: First antibiotic given within 6hrs of admit Outcome: Completed/Met Date Met:  07/18/11 Zosyn and Levaquin given in ED

## 2011-07-18 NOTE — ED Provider Notes (Signed)
Pt with cough.  Lungs few crackles.  Will admit for pneumonia.         Medical screening examination/treatment/procedure(s) were conducted as a shared visit with non-physician practitioner(s) and myself.  I personally evaluated the patient during the encounter    Benny Lennert, MD 07/18/11 1141

## 2011-07-18 NOTE — ED Notes (Signed)
Attempted to call report to floor, RN unavailable and will call back. 

## 2011-07-18 NOTE — Progress Notes (Signed)
Subjective: Breathing is much better.  Objective: Weight change:  No intake or output data in the 24 hours ending 07/18/11 1237  Filed Vitals:   07/18/11 0826  BP: 134/61  Pulse:   Temp: 98.9 F (37.2 C)  Resp: 22   Gen:  Pt seem to be in no acute distress.  Patient appears younger than her stated age. Cardiovascular: Slightly irregular.  Lungs: Decreased breath sounds at the bases. Abdomen: Positive bowel sounds Extremities: No edema.  Lab Results: reviewed  Micro Results: No results found for this or any previous visit (from the past 240 hour(s)).  Studies/Results: Dg Chest 2 View  07/17/2011  *RADIOLOGY REPORT*  Clinical Data: Cough, short of breath, weakness  CHEST - 2 VIEW  Comparison: Chest x-ray of 01/18, 01/17, and 05/15/2011  Findings: The lungs are hyperaerated consistent with COPD.  There is mild cardiomegaly present with small effusions and pulmonary vascular congestion.  There is prominent soft tissue in the right paratracheal region, possibly accentuated by slight rotation. Although this could represent ectatic vessels, mass, adenopathy, or possibly aneurysm cannot be excluded and CT the chest with IV contrast media is recommended to assess further.  A partially compressed upper lumbar vertebral body appears stable.  IMPRESSION:  1.  Probable CHF with cardiomegaly, congestion,  and effusions. 2.  COPD. 3.  Prominent right paratracheal soft tissues as described above. Recommend CT of the chest with IV contrast if further assessment if warranted.  Original Report Authenticated By: Juline Patch, M.D.   Ct Chest W Contrast  07/17/2011  *RADIOLOGY REPORT*  Clinical Data: Shortness of breath, weakness, leukocytosis, and abnormal chest radiograph  CT CHEST WITH CONTRAST  Technique:  Multidetector CT imaging of the chest was performed following the standard protocol during bolus administration of intravenous contrast.  Contrast: 80mL OMNIPAQUE IOHEXOL 300 MG/ML IJ SOLN   Comparison: CT 10/14/2008, chest radiograph 07/17/2011  Findings: No axillary or supraclavicular lymphadenopathy.  There are enlarged paratracheal lymph nodes which accounts for the plain film abnormality.  For example 9 mm right lower paratracheal lymph node.  12 mm precarinal lymph node.  11 mm left lower paratracheal lymph node. There is enlarged left infrahilar lymph node measuring 11 millimeters (image 30).  Review lung parenchyma demonstrates moderate bibasilar consolidation with air bronchograms.  There is additional consolidated thickening along the oblique fissures on the left and right.  There is a branching nodular pattern along the horizontal fissure (image 23).  There are small bilateral pleural effusions.  Limited view of the upper abdomen shows normal adrenal glands. Kidneys enhance liver appear normal. Moderate size hiatal hernia is present.  Limited view of the skeleton is unremarkable.  IMPRESSION:  1. Paratracheal lymphadenopathy accounts for plain film abnormality.  Differential includes of lymphoproliferative disorder, reactive adenopathy, and less likely metastatic disease. Lymph nodes are present on comparison CT from 2010 but normal size at that time. 2.  Consolidation in the bilateral lung bases along the oblique fissures.  Differential includes infection and neoplasm.  Original Report Authenticated By: Genevive Bi, M.D.   Medications: Scheduled Meds:   . albuterol  2.5 mg Nebulization Q6H  . amLODipine  5 mg Oral Daily  . aspirin  81 mg Oral Daily  . calcium carbonate  1,250 mg Oral BID  . docusate sodium  100 mg Oral BID  . enoxaparin  30 mg Subcutaneous Q24H  . feeding supplement  237 mL Oral TID WC  . ferrous sulfate  325 mg Oral Q breakfast  .  furosemide  20 mg Oral Daily  . levofloxacin (LEVAQUIN) IV  750 mg Intravenous Q24H  . pantoprazole  40 mg Oral Q1200  . piperacillin-tazobactam  3.375 g Intravenous Once  . piperacillin-tazobactam (ZOSYN)  IV  3.375 g  Intravenous Q8H  . polyethylene glycol  17 g Oral Daily  . potassium chloride SA  20 mEq Oral BID  . saccharomyces boulardii  250 mg Oral BID  . sodium chloride  3 mL Intravenous Q12H  . sodium chloride  3 mL Intravenous Q12H  . vancomycin  500 mg Intravenous Q12H  . vancomycin  1,000 mg Intravenous Once  . DISCONTD: piperacillin-tazobactam (ZOSYN)  IV  3.375 g Intravenous Once   Continuous Infusions:  PRN Meds:.sodium chloride, albuterol, guaiFENesin-dextromethorphan, iohexol, ondansetron (ZOFRAN) IV, ondansetron, sodium chloride  Assessment/Plan: PNA (pneumonia) (07/17/2011) Continue broad-spectrum antibiotic to cover for healthcare associated pneumonia. Ordered blood cultures x2.   Diastolic CHF, acute on chronic (05/15/2011)  continue Lasix.  HTN (hypertension) (05/15/2011) Blood pressure stable.  CKD (chronic kidney disease), stage I (05/15/2011)  monitor kidney function.    Leukocytosis (05/15/2011)  secondary to pneumonia. Monitor.  GERD (gastroesophageal reflux disease) (05/15/2011)   medical management with PPI   LOS: 1 day   Meelah Tallo JARRETT 07/18/2011, 12:37 PM

## 2011-07-19 ENCOUNTER — Other Ambulatory Visit: Payer: Self-pay

## 2011-07-19 DIAGNOSIS — J189 Pneumonia, unspecified organism: Secondary | ICD-10-CM | POA: Diagnosis not present

## 2011-07-19 DIAGNOSIS — R0602 Shortness of breath: Secondary | ICD-10-CM | POA: Diagnosis not present

## 2011-07-19 DIAGNOSIS — N179 Acute kidney failure, unspecified: Secondary | ICD-10-CM | POA: Diagnosis not present

## 2011-07-19 DIAGNOSIS — A0472 Enterocolitis due to Clostridium difficile, not specified as recurrent: Secondary | ICD-10-CM | POA: Diagnosis not present

## 2011-07-19 DIAGNOSIS — I5033 Acute on chronic diastolic (congestive) heart failure: Secondary | ICD-10-CM | POA: Diagnosis not present

## 2011-07-19 DIAGNOSIS — I10 Essential (primary) hypertension: Secondary | ICD-10-CM | POA: Diagnosis not present

## 2011-07-19 DIAGNOSIS — I129 Hypertensive chronic kidney disease with stage 1 through stage 4 chronic kidney disease, or unspecified chronic kidney disease: Secondary | ICD-10-CM | POA: Diagnosis not present

## 2011-07-19 DIAGNOSIS — R112 Nausea with vomiting, unspecified: Secondary | ICD-10-CM | POA: Diagnosis not present

## 2011-07-19 LAB — BASIC METABOLIC PANEL
CO2: 26 mEq/L (ref 19–32)
Chloride: 102 mEq/L (ref 96–112)
Glucose, Bld: 113 mg/dL — ABNORMAL HIGH (ref 70–99)
Potassium: 3.7 mEq/L (ref 3.5–5.1)
Sodium: 134 mEq/L — ABNORMAL LOW (ref 135–145)

## 2011-07-19 LAB — RETICULOCYTES
RBC.: 3.23 MIL/uL — ABNORMAL LOW (ref 3.87–5.11)
Retic Count, Absolute: 32.3 10*3/uL (ref 19.0–186.0)

## 2011-07-19 LAB — GLUCOSE, CAPILLARY

## 2011-07-19 LAB — FOLATE: Folate: 9.6 ng/mL

## 2011-07-19 LAB — IRON AND TIBC: Iron: 41 ug/dL — ABNORMAL LOW (ref 42–135)

## 2011-07-19 LAB — CBC
Hemoglobin: 8.1 g/dL — ABNORMAL LOW (ref 12.0–15.0)
MCH: 28.3 pg (ref 26.0–34.0)
RBC: 2.86 MIL/uL — ABNORMAL LOW (ref 3.87–5.11)
WBC: 19.1 10*3/uL — ABNORMAL HIGH (ref 4.0–10.5)

## 2011-07-19 LAB — VITAMIN B12: Vitamin B-12: 780 pg/mL (ref 211–911)

## 2011-07-19 MED ORDER — ENSURE PUDDING PO PUDG
1.0000 | Freq: Three times a day (TID) | ORAL | Status: DC
  Administered 2011-07-19 – 2011-07-23 (×13): 1 via ORAL
  Filled 2011-07-19 (×16): qty 1

## 2011-07-19 MED ORDER — IPRATROPIUM BROMIDE 0.02 % IN SOLN
0.5000 mg | Freq: Three times a day (TID) | RESPIRATORY_TRACT | Status: DC
  Administered 2011-07-20 – 2011-07-21 (×4): 0.5 mg via RESPIRATORY_TRACT
  Filled 2011-07-19 (×4): qty 2.5

## 2011-07-19 MED ORDER — ADULT MULTIVITAMIN W/MINERALS CH
1.0000 | ORAL_TABLET | Freq: Every day | ORAL | Status: DC
  Administered 2011-07-19 – 2011-07-23 (×5): 1 via ORAL
  Filled 2011-07-19 (×6): qty 1

## 2011-07-19 MED ORDER — ALBUTEROL SULFATE (5 MG/ML) 0.5% IN NEBU
2.5000 mg | INHALATION_SOLUTION | Freq: Three times a day (TID) | RESPIRATORY_TRACT | Status: DC
Start: 2011-07-20 — End: 2011-07-20
  Administered 2011-07-20: 2.5 mg via RESPIRATORY_TRACT
  Filled 2011-07-19: qty 0.5

## 2011-07-19 MED ORDER — ENOXAPARIN SODIUM 40 MG/0.4ML ~~LOC~~ SOLN
40.0000 mg | SUBCUTANEOUS | Status: DC
  Administered 2011-07-19 – 2011-07-22 (×4): 40 mg via SUBCUTANEOUS
  Filled 2011-07-19 (×6): qty 0.4

## 2011-07-19 NOTE — Progress Notes (Signed)
Pharmacy: Lovenox for VTE prophylaxis  Lovenox adjusted for CrCl > 30 ml/min Change lovenox to 40 mg q24h  Rosa Wyly, Loma Messing PharmD 3:33 PM 07/19/2011

## 2011-07-19 NOTE — Progress Notes (Signed)
UR complete 

## 2011-07-19 NOTE — ED Provider Notes (Signed)
Pt with some congestion.  pe lungs clear.  Will admit for possible pneumonia.attsu  Benny Lennert, MD 07/19/11 [phone]

## 2011-07-19 NOTE — Progress Notes (Signed)
Patient ID: Sarah Perkins, female   DOB: 04-09-1918, 78 y.o.   MRN: [id number]  Subjective: No events overnight. Patient denies chest pain, shortness of breath, abdominal pain. Declines PT today.  Objective:  Vital signs in last 24 hours:  Filed Vitals:   07/18/11 1450 07/18/11 1500 07/18/11 2006 07/18/11 2245  BP: 142/66   127/67  Pulse: 105 93  101  Temp: 97.9 F (36.6 C)   99.1 F (37.3 C)  TempSrc: Oral   Oral  Resp: 22 21  20   Height: 5\' 7"  (1.702 m)     Weight: 66.769 kg (147 lb 3.2 oz)     SpO2: 98%  97% 93%    Intake/Output from previous day:   Intake/Output Summary (Last 24 hours) at 07/19/11 0951 Last data filed at 07/19/11 4098  Gross per 24 hour  Intake    480 ml  Output      0 ml  Net    480 ml    Physical Exam: General: Alert, awake, oriented to name and year, in no acute distress. HEENT: No bruits, no goiter. Moist mucous membranes, no scleral icterus, no conjunctival pallor. Heart: Regular rate and rhythm, S1/S2 +, no murmurs, rubs, gallops. Lungs: Scattered rhonchi with bibasilar crackels. No wheezing, no rales.  Abdomen: Soft, nontender, nondistended, positive bowel sounds. Extremities: No clubbing or cyanosis, no pitting edema,  positive pedal pulses. Neuro: Grossly nonfocal.  Lab Results:  Lab 07/19/11 0445 07/18/11 0513 07/17/11 1310  WBC 19.1* 19.4* 24.3*  HGB 8.1* 8.8* 9.9*  HCT 24.6* 27.0* 30.2*  PLT 298 288 293  MCV 86.0 86.5 86.5  MCH 28.3 28.2 28.4  MCHC 32.9 32.6 32.8  RDW 14.7 14.7 14.5  LYMPHSABS -- -- 2.5  MONOABS -- -- 2.5*  EOSABS -- -- 0.0  BASOSABS -- -- 0.1  BANDABS -- -- --    Lab 07/19/11 0445 07/18/11 0513 07/17/11 1310  NA 134* 136 137  K 3.7 3.7 3.7  CL 102 103 101  CO2 26 25 28   GLUCOSE 113* 103* 119*  BUN 17 19 21   CREATININE 0.83 0.75 0.79  CALCIUM 8.2* 8.6 9.0  MG -- -- --   No results found for this basename: INR:5,PROTIME:5 in the last 168 hours Cardiac markers:  Lab 07/18/11 0945 07/18/11  0228 07/17/11 1815  CKMB 1.4 1.4 1.5  TROPONINI <0.30 <0.30 <0.30  MYOGLOBIN -- -- --   No components found with this basename: POCBNP:3 Recent Results (from the past 240 hour(s))  CULTURE, BLOOD (ROUTINE X 2)     Status: Normal (Preliminary result)   Collection Time   07/18/11 12:55 PM      Component Value Range Status Comment   Specimen Description BLOOD LEFT ANTECUBITAL   Final    Special Requests BOTTLES DRAWN AEROBIC AND ANAEROBIC   Final    Culture  Setup Time [id number]   Final    Culture     Final    Value:        BLOOD CULTURE RECEIVED NO GROWTH TO DATE CULTURE WILL BE HELD FOR 5 DAYS BEFORE ISSUING A FINAL NEGATIVE REPORT   Report Status PENDING   Incomplete   CULTURE, BLOOD (ROUTINE X 2)     Status: Normal (Preliminary result)   Collection Time   07/18/11  1:02 PM      Component Value Range Status Comment   Specimen Description BLOOD LEFT FOREARM   Final    Special Requests BOTTLES DRAWN AEROBIC AND  ANAEROBIC   Final    Culture  Setup Time [id number]   Final    Culture     Final    Value:        BLOOD CULTURE RECEIVED NO GROWTH TO DATE CULTURE WILL BE HELD FOR 5 DAYS BEFORE ISSUING A FINAL NEGATIVE REPORT   Report Status PENDING   Incomplete   MRSA PCR SCREENING     Status: Abnormal   Collection Time   07/18/11  2:47 PM      Component Value Range Status Comment   MRSA by PCR POSITIVE (*) NEGATIVE  Final     Studies/Results: Dg Chest 2 View 07-30-11  IMPRESSION:  1.  Probable CHF with cardiomegaly, congestion,  and effusions. 2.  COPD. 3.  Prominent right paratracheal soft tissues as described above. Recommend CT of the chest with IV contrast if further assessment if warranted.   Ct Chest W Contrast 07/30/2011 IMPRESSION:  1. Paratracheal lymphadenopathy accounts for plain film abnormality.  Differential includes of lymphoproliferative disorder, reactive adenopathy, and less likely metastatic disease. Lymph nodes are present on comparison CT from 2010 but  normal size at that time. 2.  Consolidation in the bilateral lung bases along the oblique fissures.  Differential includes infection and neoplasm.    Medications: Scheduled Meds:   . albuterol  2.5 mg Nebulization Q6H WA  . amLODipine  5 mg Oral Daily  . aspirin  81 mg Oral Daily  . calcium carbonate  1,250 mg Oral BID  . Chlorhexidine Gluconate Cloth  6 each Topical Q0600  . docusate sodium  100 mg Oral BID  . enoxaparin  30 mg Subcutaneous Q24H  . feeding supplement  237 mL Oral TID WC  . ferrous sulfate  325 mg Oral Q breakfast  . furosemide  20 mg Oral Daily  . ipratropium  0.5 mg Nebulization Q6H WA  . levofloxacin (LEVAQUIN) IV  750 mg Intravenous Q24H  . mupirocin ointment  1 application Nasal BID  . pantoprazole  40 mg Oral Q1200  . piperacillin-tazobactam  3.375 g Intravenous Once  . piperacillin-tazobactam (ZOSYN)  IV  3.375 g Intravenous Q8H  . polyethylene glycol  17 g Oral Daily  . potassium chloride SA  20 mEq Oral BID  . saccharomyces boulardii  250 mg Oral BID  . sodium chloride  3 mL Intravenous Q12H  . sodium chloride  3 mL Intravenous Q12H  . vancomycin  500 mg Intravenous Q12H  . DISCONTD: albuterol  2.5 mg Nebulization Q6H  . DISCONTD: feeding supplement  237 mL Oral TID WC  . DISCONTD: piperacillin-tazobactam (ZOSYN)  IV  3.375 g Intravenous Q8H  . DISCONTD: vancomycin  500 mg Intravenous Q12H   Continuous Infusions:  PRN Meds:.sodium chloride, albuterol, guaiFENesin-dextromethorphan, ondansetron (ZOFRAN) IV, ondansetron, sodium chloride, DISCONTD: albuterol  Assessment/Plan:  Principal Problem:  *PNA (pneumonia)  - some clinical improvement - will continue to monitor vitals per floor protocol  - continue broad spectrum antibiotics and switch to PO in AM - currently maintaining oxygen saturations > 95% on 2 L [place]  - continue to provide supportive care, nebulizers prn and scheduled   Active Problems:  Anemia of chronic disease with anemia or iron  deficiency - Hg dropping compared to yesterday - will obtain anemia panel and will continue Iron supplementation - FOBT  Diastolic CHF, acute on chronic  - will continue low dose Lasix as renal function currently tolerating  - there is mild congestion on physical exam  HTN (hypertension)  - continue Lasix for now as noted above but hold if SBP < 90 mmHg   CKD (chronic kidney disease), stage I  - creatinine remains stable and at pt's baseline  - BMP in AM   Leukocytosis  - likely related to principal problem, slowly trending down - will continue abx as noted above  - CBC in AM   GERD (gastroesophageal reflux disease)  - continue PPI   LOS: 2 days   MAGICK-Dejae Bernet 07/19/2011, 9:51 AM  [facility] Pager: [phone]

## 2011-07-19 NOTE — Progress Notes (Addendum)
CSW assessed Pt for NHP placement.  Son, Nivea Wojdyla.  Pt resting quietly.  Son lives with Pt and doesn't feel that she will be able to d/c home and he doesn't think that he wants her to return to [facility].  Son requesting information on other SNFs in [place] and information on [facility] ratings of [place] SNFs.  Son gave permission to fax Pt's information to other [facility] SNFs.  Provided son with [place] SNFs and [facility] ratings.  CSW to continue to follow.  Providence Crosby, LCSWA Clinical Social Work [phone]

## 2011-07-19 NOTE — Progress Notes (Signed)
INITIAL ADULT NUTRITION ASSESSMENT Date: 07/19/2011   Time: 1:44 PM Reason for Assessment: Nutrition risk   ASSESSMENT: Female 78 y.o.  Dx: PNA (pneumonia)  Hx:  Past Medical History  Diagnosis Date  . CHF (congestive heart failure)   . Hypertension   . Aneurysm 1972    cerebral  . Acid reflux     occasional  . Tremor     worse on right arm   Related Meds:  Scheduled Meds:   . albuterol  2.5 mg Nebulization Q6H WA  . amLODipine  5 mg Oral Daily  . aspirin  81 mg Oral Daily  . calcium carbonate  1,250 mg Oral BID  . Chlorhexidine Gluconate Cloth  6 each Topical Q0600  . docusate sodium  100 mg Oral BID  . enoxaparin  30 mg Subcutaneous Q24H  . feeding supplement  237 mL Oral TID WC  . ferrous sulfate  325 mg Oral Q breakfast  . furosemide  20 mg Oral Daily  . ipratropium  0.5 mg Nebulization Q6H WA  . levofloxacin (LEVAQUIN) IV  750 mg Intravenous Q24H  . mupirocin ointment  1 application Nasal BID  . pantoprazole  40 mg Oral Q1200  . piperacillin-tazobactam  3.375 g Intravenous Once  . piperacillin-tazobactam (ZOSYN)  IV  3.375 g Intravenous Q8H  . polyethylene glycol  17 g Oral Daily  . potassium chloride SA  20 mEq Oral BID  . saccharomyces boulardii  250 mg Oral BID  . sodium chloride  3 mL Intravenous Q12H  . sodium chloride  3 mL Intravenous Q12H  . vancomycin  500 mg Intravenous Q12H  . DISCONTD: albuterol  2.5 mg Nebulization Q6H  . DISCONTD: feeding supplement  237 mL Oral TID WC  . DISCONTD: piperacillin-tazobactam (ZOSYN)  IV  3.375 g Intravenous Q8H  . DISCONTD: vancomycin  500 mg Intravenous Q12H   Continuous Infusions:  PRN Meds:.sodium chloride, albuterol, guaiFENesin-dextromethorphan, ondansetron (ZOFRAN) IV, ondansetron, sodium chloride, DISCONTD: albuterol  Ht: 5\' 7"  (170.2 cm)  Wt: 147 lb 3.2 oz (66.769 kg)  Ideal Wt: 135 lb % Ideal Wt: 109  Usual Wt: 138-140 lb % Usual Wt: 105-106  Body mass index is 23.05 kg/(m^2).  Food/Nutrition  Related Hx: Son present at bedside who reports pt with poor appetite for the past few weeks. Pt enjoys snacking on sweets throughout the day but does not eat full meals. Pt denies any problems chewing or swallowing. Pt encouraged to drink Ensure PTA and was drinking some during visit. Son reports pt's weight is typically between 138-140 pounds however he thinks she has been losing weight but it is hard to tell with pt gaining fluid in her lungs.   Labs:  CMP     Component Value Date/Time   NA 134* 07/19/2011 0445   K 3.7 07/19/2011 0445   CL 102 07/19/2011 0445   CO2 26 07/19/2011 0445   GLUCOSE 113* 07/19/2011 0445   BUN 17 07/19/2011 0445   CREATININE 0.83 07/19/2011 0445   CREATININE 0.73 01/26/2009 1310   CALCIUM 8.2* 07/19/2011 0445   PROT 7.0 05/15/2011 1151   ALBUMIN 2.3* 05/15/2011 1151   AST 17 05/15/2011 1151   ALT 8 05/15/2011 1151   ALKPHOS 39 05/15/2011 1151   BILITOT 0.6 05/15/2011 1151   GFRNONAA 60* 07/19/2011 0445   GFRAA 69* 07/19/2011 0445    Intake/Output Summary (Last 24 hours) at 07/19/11 1348 Last data filed at 07/19/11 0920  Gross per 24 hour  Intake  840 ml  Output      0 ml  Net    840 ml   Last BM - PTA  Diet Order: General  Supplements/Tube Feeding: Ensure Clinical Strength TID  IVF:    Estimated Nutritional Needs:   Kcal: 1675-2000 Protein: 80-100g Fluid: 1.6-2L  NUTRITION DIAGNOSIS: -Inadequate oral intake (NI-2.1).  Status: Ongoing -Pt with moderate PCM of chronic illness AEB <75% energy intake for the past month with mild muscle/fat loss with increased weakness  RELATED TO: chronic poor appetite  AS EVIDENCE BY: son's statement, <50% meal intake  MONITORING/EVALUATION(Goals): Pt to consume >75% of meals/supplements.   EDUCATION NEEDS: -Education needs addressed - discussed high calorie/protein snacks and beverages.   INTERVENTION: Ensure pudding TID given with medications. Nursing/son to continue to encouraged increase intake. Will monitor.     Dietitian #: [phone]  DOCUMENTATION CODES Per approved criteria  -Non-severe (moderate) malnutrition in the context of chronic illness    Marshall Cork 07/19/2011, 1:44 PM

## 2011-07-19 NOTE — Progress Notes (Signed)
OT Note:  Pt just got off 3:1 with nursing staff.  Still needs A x 2 for transfers secondary to posterior lean, and she is too fatiqued for OT.  She was at [facility] prior to admission and will likely need to return there.  Will reattempt OT eval another time to increase activity tolerance/prevent further deconditioning.  [place], [place] [phone] 07/19/2011

## 2011-07-20 DIAGNOSIS — R0602 Shortness of breath: Secondary | ICD-10-CM | POA: Diagnosis not present

## 2011-07-20 DIAGNOSIS — N179 Acute kidney failure, unspecified: Secondary | ICD-10-CM | POA: Diagnosis not present

## 2011-07-20 DIAGNOSIS — I10 Essential (primary) hypertension: Secondary | ICD-10-CM | POA: Diagnosis not present

## 2011-07-20 DIAGNOSIS — R112 Nausea with vomiting, unspecified: Secondary | ICD-10-CM | POA: Diagnosis not present

## 2011-07-20 LAB — CBC
HCT: 24 % — ABNORMAL LOW (ref 36.0–46.0)
Hemoglobin: 7.9 g/dL — ABNORMAL LOW (ref 12.0–15.0)
MCH: 28.4 pg (ref 26.0–34.0)
MCHC: 32.9 g/dL (ref 30.0–36.0)
RBC: 2.78 MIL/uL — ABNORMAL LOW (ref 3.87–5.11)

## 2011-07-20 LAB — BASIC METABOLIC PANEL
BUN: 15 mg/dL (ref 6–23)
CO2: 27 mEq/L (ref 19–32)
Calcium: 8.4 mg/dL (ref 8.4–10.5)
GFR calc non Af Amer: 71 mL/min — ABNORMAL LOW (ref >90)
Glucose, Bld: 107 mg/dL — ABNORMAL HIGH (ref 70–99)
Potassium: 4 mEq/L (ref 3.5–5.1)

## 2011-07-20 MED ORDER — ALBUTEROL SULFATE (5 MG/ML) 0.5% IN NEBU
2.5000 mg | INHALATION_SOLUTION | Freq: Two times a day (BID) | RESPIRATORY_TRACT | Status: DC
  Administered 2011-07-20 – 2011-07-22 (×4): 2.5 mg via RESPIRATORY_TRACT
  Filled 2011-07-20 (×4): qty 0.5

## 2011-07-20 NOTE — Progress Notes (Signed)
OT Cancellation Note  ___Treatment cancelled today due to medical issues with patient which prohibited therapy  ___ Treatment cancelled today due to patient receiving procedure or test   ___ Treatment cancelled today due to patient's refusal to participate   _x__ Treatment cancelled today due to pt eating lunch, states she is "too weak" to participate today. Will check back another time.    Signature: Garrel Ridgel, OTR/L  Pager [phone] 07/20/2011

## 2011-07-20 NOTE — Progress Notes (Signed)
Patient ID: Sarah Perkins, female   DOB: Jul 20, 1917, 78 y.o.   MRN: [id number]  Subjective: No events overnight. Patient denies chest pain, shortness of breath, abdominal pain.   Objective:  Vital signs in last 24 hours:  Filed Vitals:   07/19/11 2146 07/20/11 0712 07/20/11 0739 07/20/11 1359  BP:  132/68  118/54  Pulse:  102  93  Temp:  98.4 F (36.9 C)  98 F (36.7 C)  TempSrc:  Oral  Oral  Resp:  20  18  Height:      Weight:      SpO2: 96% 97% 86% 94%    Intake/Output from previous day:  No intake or output data in the 24 hours ending 07/20/11 1449  Physical Exam: General: Alert, awake, oriented x3, in no acute distress. HEENT: No bruits, no goiter. Moist mucous membranes, no scleral icterus, no conjunctival pallor. Heart: Regular rhythm but tachycardic, S1/S2 +, no murmurs, rubs, gallops. Lungs: Clear to auscultation bilaterally with scattered rhonchi. No wheezing, no rales.  Abdomen: Soft, nontender, nondistended, positive bowel sounds. Extremities: No clubbing or cyanosis, no pitting edema,  positive pedal pulses. Neuro: Grossly nonfocal.  Lab Results:  Basic Metabolic Panel:    Component Value Date/Time   NA 137 07/20/2011 0545   K 4.0 07/20/2011 0545   CL 102 07/20/2011 0545   CO2 27 07/20/2011 0545   BUN 15 07/20/2011 0545   CREATININE 0.79 07/20/2011 0545   CREATININE 0.73 01/26/2009 1310   GLUCOSE 107* 07/20/2011 0545   CALCIUM 8.4 07/20/2011 0545   CBC:    Component Value Date/Time   WBC 16.7* 07/20/2011 0545   WBC 8.5 06/01/2009 1500   HGB 7.9* 07/20/2011 0545   HGB 11.8 06/01/2009 1500   HCT 24.0* 07/20/2011 0545   HCT 36.4 06/01/2009 1500   PLT 339 07/20/2011 0545   PLT 328 06/01/2009 1500   MCV 86.3 07/20/2011 0545   MCV 92.6 06/01/2009 1500   NEUTROABS 19.2* 07/17/2011 1310   NEUTROABS 5.0 06/01/2009 1500   LYMPHSABS 2.5 07/17/2011 1310   LYMPHSABS 2.5 06/01/2009 1500   MONOABS 2.5* 07/17/2011 1310   MONOABS 0.8 06/01/2009 1500   EOSABS 0.0 07/17/2011 1310     EOSABS 0.1 06/01/2009 1500   BASOSABS 0.1 07/17/2011 1310   BASOSABS 0.1 06/01/2009 1500      Lab 07/20/11 0545 07/19/11 0445 07/18/11 0513 07/17/11 1310  WBC 16.7* 19.1* 19.4* 24.3*  HGB 7.9* 8.1* 8.8* 9.9*  HCT 24.0* 24.6* 27.0* 30.2*  PLT 339 298 288 293  MCV 86.3 86.0 86.5 86.5  MCH 28.4 28.3 28.2 28.4  MCHC 32.9 32.9 32.6 32.8  RDW 14.9 14.7 14.7 14.5  LYMPHSABS -- -- -- 2.5  MONOABS -- -- -- 2.5*  EOSABS -- -- -- 0.0  BASOSABS -- -- -- 0.1  BANDABS -- -- -- --    Lab 07/20/11 0545 07/19/11 0445 07/18/11 0513 07/17/11 1310  NA 137 134* 136 137  K 4.0 3.7 3.7 3.7  CL 102 102 103 101  CO2 27 26 25 28   GLUCOSE 107* 113* 103* 119*  BUN 15 17 19 21   CREATININE 0.79 0.83 0.75 0.79  CALCIUM 8.4 8.2* 8.6 9.0  MG -- -- -- --   No results found for this basename: INR:5,PROTIME:5 in the last 168 hours Cardiac markers:  Lab 07/18/11 0945 07/18/11 0228 07/17/11 1815  CKMB 1.4 1.4 1.5  TROPONINI <0.30 <0.30 <0.30  MYOGLOBIN -- -- --   No components found with  this basename: POCBNP:3 Recent Results (from the past 240 hour(s))  CULTURE, BLOOD (ROUTINE X 2)     Status: Normal (Preliminary result)   Collection Time   07/18/11 12:55 PM      Component Value Range Status Comment   Specimen Description BLOOD LEFT ANTECUBITAL   Final    Special Requests BOTTLES DRAWN AEROBIC AND ANAEROBIC   Final    Culture  Setup Time [id number]   Final    Culture     Final    Value:        BLOOD CULTURE RECEIVED NO GROWTH TO DATE CULTURE WILL BE HELD FOR 5 DAYS BEFORE ISSUING A FINAL NEGATIVE REPORT   Report Status PENDING   Incomplete   CULTURE, BLOOD (ROUTINE X 2)     Status: Normal (Preliminary result)   Collection Time   07/18/11  1:02 PM      Component Value Range Status Comment   Specimen Description BLOOD LEFT FOREARM   Final    Special Requests BOTTLES DRAWN AEROBIC AND ANAEROBIC   Final    Culture  Setup Time [id number]   Final    Culture     Final    Value:        BLOOD  CULTURE RECEIVED NO GROWTH TO DATE CULTURE WILL BE HELD FOR 5 DAYS BEFORE ISSUING A FINAL NEGATIVE REPORT   Report Status PENDING   Incomplete   MRSA PCR SCREENING     Status: Abnormal   Collection Time   07/18/11  2:47 PM      Component Value Range Status Comment   MRSA by PCR POSITIVE (*) NEGATIVE  Final     Studies/Results: No results found.  Medications: Scheduled Meds:   . albuterol  2.5 mg Nebulization TID  . amLODipine  5 mg Oral Daily  . aspirin  81 mg Oral Daily  . calcium carbonate  1,250 mg Oral BID  . Chlorhexidine Gluconate Cloth  6 each Topical Q0600  . docusate sodium  100 mg Oral BID  . enoxaparin  40 mg Subcutaneous Q24H  . feeding supplement  237 mL Oral TID WC  . feeding supplement  1 Container Oral TID WC  . ferrous sulfate  325 mg Oral Q breakfast  . furosemide  20 mg Oral Daily  . ipratropium  0.5 mg Nebulization TID  . levofloxacin (LEVAQUIN) IV  750 mg Intravenous Q24H  . mulitivitamin with minerals  1 tablet Oral Daily  . mupirocin ointment  1 application Nasal BID  . pantoprazole  40 mg Oral Q1200  . piperacillin-tazobactam  3.375 g Intravenous Once  . piperacillin-tazobactam (ZOSYN)  IV  3.375 g Intravenous Q8H  . polyethylene glycol  17 g Oral Daily  . potassium chloride SA  20 mEq Oral BID  . saccharomyces boulardii  250 mg Oral BID  . sodium chloride  3 mL Intravenous Q12H  . vancomycin  500 mg Intravenous Q12H  . DISCONTD: albuterol  2.5 mg Nebulization Q6H WA  . DISCONTD: enoxaparin  30 mg Subcutaneous Q24H  . DISCONTD: ipratropium  0.5 mg Nebulization Q6H WA  . DISCONTD: sodium chloride  3 mL Intravenous Q12H   Continuous Infusions:  PRN Meds:.sodium chloride, albuterol, guaiFENesin-dextromethorphan, ondansetron (ZOFRAN) IV, ondansetron, DISCONTD: sodium chloride  Assessment/Plan:  Principal Problem:  *PNA (pneumonia)  - some clinical improvement  - will continue to monitor vitals per floor protocol  - continue broad spectrum  antibiotics and plan to switch to PO - currently  maintaining oxygen saturations > 95% on 2 L [place]  - continue to provide supportive care, nebulizers prn and scheduled   Active Problems:  Anemia of chronic disease with anemia or iron deficiency  - Hg dropping compared to yesterday  - FOBT +, anemia panel indicative of iron deficiency anemia - will discuss with son if OK to obtain GI consult for questionable colonoscopy - CBC in AM but hold of on transfusion today - if Hg <7.5 in Am will go ahead and transfuse  Diastolic CHF, acute on chronic  - will continue low dose Lasix as renal function currently tolerating  - there is mild congestion on physical exam   HTN (hypertension)  - continue Lasix for now as noted above but hold if SBP < 90 mmHg   CKD (chronic kidney disease), stage I  - creatinine remains stable and at pt's baseline  - BMP in AM   Leukocytosis  - likely related to principal problem, slowly trending down  - will continue abx as noted above  - CBC in AM   GERD (gastroesophageal reflux disease)  - continue PPI   LOS: 3 days   MAGICK-Sarah Perkins 07/20/2011, 2:49 PM  [facility] Pager: [phone]

## 2011-07-20 NOTE — Progress Notes (Signed)
ANTIBIOTIC CONSULT NOTE - FOLLOW UP  Pharmacy Consult for Vanc/Zosyn/Levaquin Indication: rule out pneumonia  No Known Allergies  Patient Measurements: Height: 5\' 7"  (170.2 cm) Weight: 147 lb 3.2 oz (66.769 kg) IBW/kg (Calculated) : 61.6   Vital Signs: Temp: 98 F (36.7 C) (03/23 1359) Temp src: Oral (03/23 1359) BP: 118/54 mmHg (03/23 1359) Pulse Rate: 93  (03/23 1359) Intake/Output from previous day: 03/22 0701 - 03/23 0700 In: 480 [P.O.:480] Out: -  Intake/Output from this shift:    Labs:  Basename 07/20/11 0545 07/19/11 0445 07/18/11 0513  WBC 16.7* 19.1* 19.4*  HGB 7.9* 8.1* 8.8*  PLT 339 298 288  LABCREA -- -- --  CREATININE 0.79 0.83 0.75   Estimated Creatinine Clearance: 44.5 ml/min (by C-G formula based on Cr of 0.79). No results found for this basename: VANCOTROUGH:2,VANCOPEAK:2,VANCORANDOM:2,GENTTROUGH:2,GENTPEAK:2,GENTRANDOM:2,TOBRATROUGH:2,TOBRAPEAK:2,TOBRARND:2,AMIKACINPEAK:2,AMIKACINTROU:2,AMIKACIN:2, in the last 72 hours   Microbiology: Recent Results (from the past 720 hour(s))  CULTURE, BLOOD (ROUTINE X 2)     Status: Normal (Preliminary result)   Collection Time   07/18/11 12:55 PM      Component Value Range Status Comment   Specimen Description BLOOD LEFT ANTECUBITAL   Final    Special Requests BOTTLES DRAWN AEROBIC AND ANAEROBIC   Final    Culture  Setup Time [id number]   Final    Culture     Final    Value:        BLOOD CULTURE RECEIVED NO GROWTH TO DATE CULTURE WILL BE HELD FOR 5 DAYS BEFORE ISSUING A FINAL NEGATIVE REPORT   Report Status PENDING   Incomplete   CULTURE, BLOOD (ROUTINE X 2)     Status: Normal (Preliminary result)   Collection Time   07/18/11  1:02 PM      Component Value Range Status Comment   Specimen Description BLOOD LEFT FOREARM   Final    Special Requests BOTTLES DRAWN AEROBIC AND ANAEROBIC   Final    Culture  Setup Time [id number]   Final    Culture     Final    Value:        BLOOD CULTURE RECEIVED NO  GROWTH TO DATE CULTURE WILL BE HELD FOR 5 DAYS BEFORE ISSUING A FINAL NEGATIVE REPORT   Report Status PENDING   Incomplete   MRSA PCR SCREENING     Status: Abnormal   Collection Time   07/18/11  2:47 PM      Component Value Range Status Comment   MRSA by PCR POSITIVE (*) NEGATIVE  Final     Anti-infectives     Start     Dose/Rate Route Frequency Ordered Stop   07/18/11 1830   vancomycin (VANCOCIN) 500 mg in sodium chloride 0.9 % 100 mL IVPB        500 mg 100 mL/hr over 60 Minutes Intravenous Every 12 hours 07/18/11 1826     07/18/11 1800  piperacillin-tazobactam (ZOSYN) IVPB 3.375 g       3.375 g 12.5 mL/hr over 240 Minutes Intravenous Every 8 hours 07/18/11 1808     07/18/11 1400   vancomycin (VANCOCIN) 500 mg in sodium chloride 0.9 % 100 mL IVPB  Status:  Discontinued        500 mg 100 mL/hr over 60 Minutes Intravenous Every 12 hours 07/17/11 1812 07/18/11 1826   07/18/11 0600   piperacillin-tazobactam (ZOSYN) IVPB 3.375 g  Status:  Discontinued        3.375 g 12.5 mL/hr over 240 Minutes Intravenous 3 times  per day 07/17/11 1812 07/18/11 1808   07/17/11 2000   Levofloxacin (LEVAQUIN) IVPB 750 mg        750 mg 100 mL/hr over 90 Minutes Intravenous Every 24 hours 07/17/11 1812     07/17/11 1815  piperacillin-tazobactam (ZOSYN) IVPB 3.375 g       3.375 g 100 mL/hr over 30 Minutes Intravenous  Once 07/17/11 1812     07/17/11 1700   vancomycin (VANCOCIN) IVPB 1000 mg/200 mL premix        1,000 mg 200 mL/hr over 60 Minutes Intravenous  Once 07/17/11 1628 07/17/11 1817   07/17/11 1700   piperacillin-tazobactam (ZOSYN) IVPB 3.375 g  Status:  Discontinued        3.375 g 12.5 mL/hr over 240 Minutes Intravenous  Once 07/17/11 1628 07/17/11 2313         Assessment:  89 YOF with leukocytosis, chest CT with consolidation in the bilateral lung bases to start Vanc, Zosyn and Levaquin for suspected HCAP.  AF  Scr stable.  Blood cultures negative to date.   Goal of Therapy:    Vancomycin trough level 15-20 mcg/ml Appropriate renal dosing of Levaquin and Zosyn  Plan:   Continue Vanc 500mg  IV q12h, Zosyn 3.375g IV Q8H infused over 4hrs, and Levaquin 750mg  IV q24h.  Check Vanc trough 3/24 if it continues.  Reece Packer 07/20/2011,2:24 PM

## 2011-07-21 DIAGNOSIS — R0602 Shortness of breath: Secondary | ICD-10-CM | POA: Diagnosis not present

## 2011-07-21 DIAGNOSIS — R112 Nausea with vomiting, unspecified: Secondary | ICD-10-CM | POA: Diagnosis not present

## 2011-07-21 DIAGNOSIS — I10 Essential (primary) hypertension: Secondary | ICD-10-CM | POA: Diagnosis not present

## 2011-07-21 DIAGNOSIS — N179 Acute kidney failure, unspecified: Secondary | ICD-10-CM | POA: Diagnosis not present

## 2011-07-21 LAB — CBC
HCT: 24.2 % — ABNORMAL LOW (ref 36.0–46.0)
MCH: 28.7 pg (ref 26.0–34.0)
MCV: 86.7 fL (ref 78.0–100.0)
Platelets: 346 10*3/uL (ref 150–400)
RBC: 2.79 MIL/uL — ABNORMAL LOW (ref 3.87–5.11)
WBC: 16.3 10*3/uL — ABNORMAL HIGH (ref 4.0–10.5)

## 2011-07-21 LAB — BASIC METABOLIC PANEL
BUN: 11 mg/dL (ref 6–23)
CO2: 27 mEq/L (ref 19–32)
Calcium: 8.3 mg/dL — ABNORMAL LOW (ref 8.4–10.5)
Chloride: 104 mEq/L (ref 96–112)
Creatinine, Ser: 0.77 mg/dL (ref 0.50–1.10)

## 2011-07-21 MED ORDER — LEVOFLOXACIN 750 MG PO TABS
750.0000 mg | ORAL_TABLET | Freq: Every day | ORAL | Status: DC
  Administered 2011-07-21 – 2011-07-23 (×3): 750 mg via ORAL
  Filled 2011-07-21 (×4): qty 1

## 2011-07-21 NOTE — Progress Notes (Signed)
Patient ID: Sarah Perkins, female   DOB: 1918/01/10, 78 y.o.   MRN: [id number]  Subjective: No events overnight. Patient denies chest pain, shortness of breath, abdominal pain. Per nurse several episodes of nonbloody diarrhea.  Objective:  Vital signs in last 24 hours:  Filed Vitals:   07/20/11 2001 07/20/11 2200 07/21/11 0646 07/21/11 0754  BP:  122/66 136/65   Pulse:  99 99   Temp:  98.7 F (37.1 C) 97.9 F (36.6 C)   TempSrc:  Oral Oral   Resp:  16 18   Height:      Weight:      SpO2: 94% 92% 95% 95%    Intake/Output from previous day:   Intake/Output Summary (Last 24 hours) at 07/21/11 1309 Last data filed at 07/20/11 1600  Gross per 24 hour  Intake 391.65 ml  Output      0 ml  Net 391.65 ml    Physical Exam: General: Alert, awake, oriented to name and place, in no acute distress. HEENT: No bruits, no goiter. Moist mucous membranes, no scleral icterus, no conjunctival pallor. Heart: Regular rate and rhythm, S1/S2 +, no murmurs, rubs, gallops. Lungs: Clear to auscultation bilaterally with minimal bibasilar crackles. No wheezing, no rhonchi, no rales.  Abdomen: Soft, nontender, nondistended, positive bowel sounds. Extremities: No clubbing or cyanosis, no pitting edema,  positive pedal pulses. Neuro: Grossly nonfocal.  Lab Results:  Basic Metabolic Panel:  Lab 07/21/11 1478 07/20/11 0545 07/19/11 0445 07/18/11 0513 07/17/11 1310  WBC 16.3* 16.7* 19.1* 19.4* 24.3*  HGB 8.0* 7.9* 8.1* 8.8* 9.9*  HCT 24.2* 24.0* 24.6* 27.0* 30.2*  PLT 346 339 298 288 293    Lab 07/21/11 0627 07/20/11 0545 07/19/11 0445 07/18/11 0513 07/17/11 1310  NA 138 137 134* 136 137  K 3.7 4.0 3.7 3.7 3.7  CL 104 102 102 103 101  CO2 27 27 26 25 28   GLUCOSE 108* 107* 113* 103* 119*  BUN 11 15 17 19 21   CREATININE 0.77 0.79 0.83 0.75 0.79  CALCIUM 8.3* 8.4 8.2* 8.6 9.0  MG -- -- -- -- --    Recent Results (from the past 240 hour(s))  CULTURE, BLOOD (ROUTINE X 2)     Status:  Normal (Preliminary result)   Collection Time   07/18/11 12:55 PM      Component Value Range Status Comment   Specimen Description BLOOD LEFT ANTECUBITAL   Final    Special Requests BOTTLES DRAWN AEROBIC AND ANAEROBIC   Final    Culture  Setup Time [id number]   Final    Culture     Final    Value:        BLOOD CULTURE RECEIVED NO GROWTH TO DATE CULTURE WILL BE HELD FOR 5 DAYS BEFORE ISSUING A FINAL NEGATIVE REPORT   Report Status PENDING   Incomplete   CULTURE, BLOOD (ROUTINE X 2)     Status: Normal (Preliminary result)   Collection Time   07/18/11  1:02 PM      Component Value Range Status Comment   Specimen Description BLOOD LEFT FOREARM   Final    Special Requests BOTTLES DRAWN AEROBIC AND ANAEROBIC   Final    Culture  Setup Time [id number]   Final    Culture     Final    Value:        BLOOD CULTURE RECEIVED NO GROWTH TO DATE CULTURE WILL BE HELD FOR 5 DAYS BEFORE ISSUING A FINAL NEGATIVE REPORT  Report Status PENDING   Incomplete   MRSA PCR SCREENING     Status: Abnormal   Collection Time   07/18/11  2:47 PM      Component Value Range Status Comment   MRSA by PCR POSITIVE (*) NEGATIVE  Final     Studies/Results: No results found.  Medications: Scheduled Meds:   . albuterol  2.5 mg Nebulization BID  . amLODipine  5 mg Oral Daily  . aspirin  81 mg Oral Daily  . calcium carbonate  1,250 mg Oral BID  . Chlorhexidine Gluconate Cloth  6 each Topical Q0600  . enoxaparin  40 mg Subcutaneous Q24H  . feeding supplement  237 mL Oral TID WC  . feeding supplement  1 Container Oral TID WC  . ferrous sulfate  325 mg Oral Q breakfast  . furosemide  20 mg Oral Daily  . ipratropium  0.5 mg Nebulization TID  . levofloxacin  750 mg Oral QPC supper  . mulitivitamin with minerals  1 tablet Oral Daily  . mupirocin ointment  1 application Nasal BID  . pantoprazole  40 mg Oral Q1200  . potassium chloride SA  20 mEq Oral BID  . saccharomyces boulardii  250 mg Oral BID  . sodium  chloride  3 mL Intravenous Q12H  . DISCONTD: albuterol  2.5 mg Nebulization TID  . DISCONTD: docusate sodium  100 mg Oral BID  . DISCONTD: levofloxacin (LEVAQUIN) IV  750 mg Intravenous Q24H  . DISCONTD: piperacillin-tazobactam  3.375 g Intravenous Once  . DISCONTD: piperacillin-tazobactam (ZOSYN)  IV  3.375 g Intravenous Q8H  . DISCONTD: polyethylene glycol  17 g Oral Daily  . DISCONTD: sodium chloride  3 mL Intravenous Q12H  . DISCONTD: vancomycin  500 mg Intravenous Q12H   Continuous Infusions:  PRN Meds:.sodium chloride, albuterol, guaiFENesin-dextromethorphan, ondansetron (ZOFRAN) IV, ondansetron, DISCONTD: sodium chloride  Assessment/Plan:  Principal Problem:  *PNA (pneumonia)  - clinically improving - will continue to monitor vitals per floor protocol  - continue antibiotics but switch to PO - currently maintaining oxygen saturations > 95% on 2 L [place]  - continue to provide supportive care, nebulizers prn and scheduled   Active Problems:  Diarrhea - nonbloody and unclear etiology - will obtain C. Diff by PCR since pt was on broad spectrum abx - d/c stool softeners PRN and scheduled  Anemia of chronic disease with anemia or iron deficiency  - Hg stable compared to yesterday  - FOBT +, anemia panel indicative of iron deficiency anemia  - please note that pt had colonoscopy 02/2010 done by Dr. Ewing Schlein and has shown divarticular bleed and polyp in ascending colon - CBC in AM but hold of on transfusion today  - if Hg <7.5 in Am will go ahead and transfuse   Diastolic CHF, acute on chronic  - will continue low dose Lasix as renal function currently tolerating  - there is mild congestion on physical exam   HTN (hypertension)  - continue Lasix for now as noted above but hold if SBP < 90 mmHg   CKD (chronic kidney disease), stage I  - creatinine remains stable and at pt's baseline  - BMP in AM   Leukocytosis  - likely related to principal problem, slowly trending down  -  will continue abx as noted above  - CBC in AM   GERD (gastroesophageal reflux disease)  - continue PPI   LOS: 4 days   MAGICK-Simran Bomkamp 07/21/2011, 1:09 PM  [facility] Pager: [phone]

## 2011-07-22 DIAGNOSIS — R0602 Shortness of breath: Secondary | ICD-10-CM | POA: Diagnosis not present

## 2011-07-22 DIAGNOSIS — N179 Acute kidney failure, unspecified: Secondary | ICD-10-CM | POA: Diagnosis not present

## 2011-07-22 DIAGNOSIS — I10 Essential (primary) hypertension: Secondary | ICD-10-CM | POA: Diagnosis not present

## 2011-07-22 DIAGNOSIS — R112 Nausea with vomiting, unspecified: Secondary | ICD-10-CM | POA: Diagnosis not present

## 2011-07-22 LAB — CLOSTRIDIUM DIFFICILE BY PCR: Toxigenic C. Difficile by PCR: POSITIVE — AB

## 2011-07-22 LAB — CBC
HCT: 25.4 % — ABNORMAL LOW (ref 36.0–46.0)
Hemoglobin: 8.3 g/dL — ABNORMAL LOW (ref 12.0–15.0)
MCH: 28.5 pg (ref 26.0–34.0)
MCHC: 32.7 g/dL (ref 30.0–36.0)
MCV: 87.3 fL (ref 78.0–100.0)
RDW: 15.3 % (ref 11.5–15.5)

## 2011-07-22 MED ORDER — METRONIDAZOLE 500 MG PO TABS
500.0000 mg | ORAL_TABLET | Freq: Three times a day (TID) | ORAL | Status: DC
  Administered 2011-07-22 – 2011-07-23 (×4): 500 mg via ORAL
  Filled 2011-07-22 (×9): qty 1

## 2011-07-22 NOTE — Progress Notes (Signed)
Physical Therapy Treatment Patient Details Name: Sarah Perkins MRN: [id number] DOB: 12/29/1917 Today's Date: 07/22/2011  PT Assessment/Plan  PT - Assessment/Plan Comments on Treatment Session: Pt leans posteriorly in standing which requires +2 total assist to maintain upright position.  PT Plan: Discharge plan remains appropriate PT Frequency: Min 3X/week Recommendations for Other Services: OT consult Follow Up Recommendations: Skilled nursing facility Equipment Recommended: Defer to next venue PT Goals  Acute Rehab PT Goals PT Goal Formulation: With patient/family Time For Goal Achievement: 2 weeks Pt will go Supine/Side to Sit: with min assist PT Goal: Supine/Side to Sit - Progress: Not progressing Pt will Sit at [facility] of Bed: with min assist PT Goal: Sit at Edge Of Bed - Progress: Progressing toward goal Pt will go Sit to Supine/Side: with min assist Pt will go Sit to Stand: with min assist PT Goal: Sit to Stand - Progress: Progressing toward goal Pt will go Stand to Sit: with min assist PT Goal: Stand to Sit - Progress: Progressing toward goal Pt will Transfer Bed to Chair/Chair to Bed: with min assist PT Transfer Goal: Bed to Chair/Chair to Bed - Progress: Progressing toward goal  PT Treatment Precautions/Restrictions  Precautions Precautions: Fall Restrictions Weight Bearing Restrictions: No Mobility (including Balance) Bed Mobility Bed Mobility: Yes Supine to Sit: Patient percentage (comment);1: +2 Total assist (pt 20%) Supine to Sit Details (indicate cue type and reason): pt 20%, pt assisted with advancing LEs, +2 to elevate trunk and scoot to EOB Transfers Transfers: Yes Sit to Stand: 1: +2 Total assist;Patient percentage (comment);From elevated surface;From bed Sit to Stand Details (indicate cue type and reason): pt 50%, assist for balance 2* pt leans posteriorly, assist to flex knees for setup Stand to Sit: 1: +2 Total assist;Patient percentage  (comment);To chair/3-in-1 Stand to Sit Details: assist to control descent,  Stand Pivot Transfers: 1: +2 Total assist;Patient percentage (comment) Stand Pivot Transfer Details (indicate cue type and reason): pt 40%, with RW, pt able to initiate small steps, +2 for balance 2* posterior lean Ambulation/Gait Ambulation/Gait: No  Static Sitting Balance Static Sitting - Balance Support: Bilateral upper extremity supported;Feet supported Static Sitting - Level of Assistance: 5: Stand by assistance Static Sitting - Comment/# of Minutes: 3 Exercise    End of Session PT - End of Session Equipment Utilized During Treatment: Gait belt Activity Tolerance: Patient limited by fatigue Patient left: in chair;with call bell in reach;with family/visitor present;Other (comment) Nurse Communication: Mobility status for transfers;Need for lift equipment General Behavior During Session: [facility] for tasks performed Cognition: [facility] for tasks performed  Tamala Ser 07/22/2011, 11:04 AM [phone]

## 2011-07-22 NOTE — Evaluation (Signed)
Occupational Therapy Evaluation Patient Details Name: Sarah Perkins MRN: [id number] DOB: 11/05/1917 Today's Date: 07/22/2011  Problem List:  Patient Active Problem List  Diagnoses  . SIRS (systemic inflammatory response syndrome)  . Diastolic CHF, acute on chronic  . HTN (hypertension)  . CKD (chronic kidney disease), stage I  . Leukocytosis  . GERD (gastroesophageal reflux disease)  . Diverticulosis  . SAH (subarachnoid hemorrhage)  . Hypokalemia  . PNA (pneumonia)    Past Medical History:  Past Medical History  Diagnosis Date  . CHF (congestive heart failure)   . Hypertension   . Aneurysm 1972    cerebral  . Acid reflux     occasional  . Tremor     worse on right arm   Past Surgical History:  Past Surgical History  Procedure Date  . Fracture surgery     OT Assessment/Plan/Recommendation OT Assessment Clinical Impression Statement: This 78 year old female who is a resident at [facility] was admitted for pna.  Son reports that she did get up with staff and RW and took a few steps.  She is deconditioned further after this admission to the hospital.  She is appropriate for skilled OT to increase indpendence with ADLs/toilet transfers and to decrease burden of care. OT Recommendation/Assessment: Patient will need skilled OT in the acute care venue OT Problem List: Decreased strength;Decreased activity tolerance;Impaired balance (sitting and/or standing);Decreased safety awareness;Cardiopulmonary status limiting activity Problem List Comments: currently using 02 in acute OT Therapy Diagnosis : Generalized weakness OT Plan OT Frequency: Min 1X/week OT Treatment/Interventions: Self-care/ADL training;Therapeutic activities;Balance training;Patient/family education OT Recommendation Follow Up Recommendations: Skilled nursing facility Equipment Recommended: Defer to next venue Individuals Consulted Consulted and Agree with Results and Recommendations: Family  member/caregiver Family Member Consulted: son OT Goals Acute Rehab OT Goals OT Goal Formulation: With patient/family Time For Goal Achievement: 2 weeks ADL Goals Pt Will Transfer to Toilet: with 2+ total assist;with cueing (comment type and amount);Other (comment) (pt 60% with mod cues) ADL Goal: Toilet Transfer - Progress: Goal set today Miscellaneous OT Goals Miscellaneous OT Goal #1: Pt will maintain static standing with RW x 2 minutes for ADLs, with min A and mod cues OT Goal: Miscellaneous Goal #1 - Progress: Goal set today  OT Evaluation Precautions/Restrictions  Precautions Precautions: Fall Restrictions Weight Bearing Restrictions: No Prior Functioning Home Living Lives With: Other (Comment) (from [facility]) Prior Function Level of Independence: Needs assistance with ADLs ADL ADL Eating/Feeding: Simulated;Performed;Set up (able to drink with set up:  has bil intention tremor) Where Assessed - Eating/Feeding: Chair Grooming: Simulated;Minimal assistance Where Assessed - Grooming: Sitting, chair;Supported Upper Body Bathing: Simulated;Moderate assistance Where Assessed - Upper Body Bathing: Sitting, chair Lower Body Bathing: Simulated;+2 Total assistance;Comment for patient % (0) Where Assessed - Lower Body Bathing: Sit to stand from chair Upper Body Dressing: Moderate assistance;Performed Where Assessed - Upper Body Dressing: Sitting, chair;Supported Lower Body Dressing: Simulated;+2 Total assistance;Comment for patient % (0) Where Assessed - Lower Body Dressing: Sit to stand from chair Toilet Transfer: Simulated;+2 Total assistance;Comment for patient % (pt 40) Toilet Transfer Details (indicate cue type and reason): mod cues:  pt sits prematurely Toilet Transfer Method: Surveyor, minerals: Other (comment) (bed to recliner) Toileting - Clothing Manipulation: Simulated;+2 Total assistance;Comment for patient % (0) Where Assessed - Toileting  Clothing Manipulation: Standing Toileting - Hygiene: Performed;+2 Total assistance;Comment for patient % (0) Toileting - Hygiene Details (indicate cue type and reason): pt incontinent of bowel Where Assessed -  Toileting Hygiene: Standing ADL Comments: cotx with PT.   Vision/Perception  Vision - History Patient Visual Report: No change from baseline Cognition Cognition Orientation Level: Oriented to person;Disoriented to situation Sensation/Coordination Coordination Fine Motor Movements are Fluid and Coordinated:  (pt has bil intention tremor; didn't interfere with beverage) Extremity Assessment RUE Assessment RUE Assessment: Within Functional Limits LUE Assessment LUE Assessment: Within Functional Limits Mobility  Bed Mobility Bed Mobility: Yes Supine to Sit: Patient percentage (comment);1: +2 Total assist (pt 20%) Supine to Sit Details (indicate cue type and reason): pt 20%, pt assisted with advancing LEs, +2 to elevate trunk and scoot to EOB Transfers Sit to Stand: 1: +2 Total assist;Patient percentage (comment);From elevated surface;From bed Sit to Stand Details (indicate cue type and reason): pt 50%, assist for balance 2* pt leans posteriorly, assist to flex knees for setup  Exercises   End of Session OT - End of Session Equipment Utilized During Treatment: Gait belt Activity Tolerance: Patient limited by fatigue Patient left: in chair;with call bell in reach;with family/visitor present General Behavior During Session: [facility] for tasks performed Cognition: [facility] for tasks performed [facility], OTR/L [phone] 07/22/2011  Nandana Krolikowski 07/22/2011, 1:09 PM

## 2011-07-22 NOTE — Progress Notes (Signed)
Patient ID: Sarah Perkins, female   DOB: 1918-04-27, 78 y.o.   MRN: [id number]  Subjective: No events overnight. Patient denies chest pain, shortness of breath, abdominal pain.   Objective:  Vital signs in last 24 hours:  Filed Vitals:   07/21/11 2200 07/22/11 0600 07/22/11 1100 07/22/11 1504  BP: 141/69 132/69  120/63  Pulse: 108 98  100  Temp: 98.5 F (36.9 C) 98.6 F (37 C)  97.6 F (36.4 C)  TempSrc: Oral Oral  Oral  Resp: 18 18  18   Height:      Weight:      SpO2: 93% 92% 94% 96%    Intake/Output from previous day:  Intake/Output Summary (Last 24 hours) at 07/22/11 1713 Last data filed at 07/22/11 0900  Gross per 24 hour  Intake    360 ml  Output      0 ml  Net    360 ml    Physical Exam: General: Alert, awake, oriented to name and place, in no acute distress. HEENT: No bruits, no goiter. Moist mucous membranes, no scleral icterus, no conjunctival pallor. Heart: Regular rate and rhythm, S1/S2 +, no murmurs, rubs, gallops. Lungs: Clear to auscultation bilaterally with minimal bibasilar crackles. No wheezing, no rhonchi, no rales.  Abdomen: Soft, nontender, nondistended, positive bowel sounds. Extremities: No clubbing or cyanosis, no pitting edema,  positive pedal pulses. Neuro: Grossly nonfocal.  Lab Results:  Basic Metabolic Panel:    Component Value Date/Time   NA 138 07/21/2011 0627   K 3.7 07/21/2011 0627   CL 104 07/21/2011 0627   CO2 27 07/21/2011 0627   BUN 11 07/21/2011 0627   CREATININE 0.77 07/21/2011 0627   CREATININE 0.73 01/26/2009 1310   GLUCOSE 108* 07/21/2011 0627   CALCIUM 8.3* 07/21/2011 0627   CBC:    Component Value Date/Time   WBC 15.5* 07/22/2011 0505   WBC 8.5 06/01/2009 1500   HGB 8.3* 07/22/2011 0505   HGB 11.8 06/01/2009 1500   HCT 25.4* 07/22/2011 0505   HCT 36.4 06/01/2009 1500   PLT 379 07/22/2011 0505   PLT 328 06/01/2009 1500   MCV 87.3 07/22/2011 0505   MCV 92.6 06/01/2009 1500   NEUTROABS 19.2* 07/17/2011 1310   NEUTROABS 5.0  06/01/2009 1500   LYMPHSABS 2.5 07/17/2011 1310   LYMPHSABS 2.5 06/01/2009 1500   MONOABS 2.5* 07/17/2011 1310   MONOABS 0.8 06/01/2009 1500   EOSABS 0.0 07/17/2011 1310   EOSABS 0.1 06/01/2009 1500   BASOSABS 0.1 07/17/2011 1310   BASOSABS 0.1 06/01/2009 1500      Lab 07/22/11 0505 07/21/11 0627 07/20/11 0545 07/19/11 0445 07/18/11 0513 07/17/11 1310  WBC 15.5* 16.3* 16.7* 19.1* 19.4* --  HGB 8.3* 8.0* 7.9* 8.1* 8.8* --  HCT 25.4* 24.2* 24.0* 24.6* 27.0* --  PLT 379 346 339 298 288 --  MCV 87.3 86.7 86.3 86.0 86.5 --  MCH 28.5 28.7 28.4 28.3 28.2 --  MCHC 32.7 33.1 32.9 32.9 32.6 --  RDW 15.3 15.1 14.9 14.7 14.7 --  LYMPHSABS -- -- -- -- -- 2.5  MONOABS -- -- -- -- -- 2.5*  EOSABS -- -- -- -- -- 0.0  BASOSABS -- -- -- -- -- 0.1  BANDABS -- -- -- -- -- --    Lab 07/21/11 0627 07/20/11 0545 07/19/11 0445 07/18/11 0513 07/17/11 1310  NA 138 137 134* 136 137  K 3.7 4.0 3.7 3.7 3.7  CL 104 102 102 103 101  CO2 27 27 26  25  28  GLUCOSE 108* 107* 113* 103* 119*  BUN 11 15 17 19 21   CREATININE 0.77 0.79 0.83 0.75 0.79  CALCIUM 8.3* 8.4 8.2* 8.6 9.0  MG -- -- -- -- --   No results found for this basename: INR:5,PROTIME:5 in the last 168 hours Cardiac markers:  Lab 07/18/11 0945 07/18/11 0228 07/17/11 1815  CKMB 1.4 1.4 1.5  TROPONINI <0.30 <0.30 <0.30  MYOGLOBIN -- -- --   No components found with this basename: POCBNP:3 Recent Results (from the past 240 hour(s))  CULTURE, BLOOD (ROUTINE X 2)     Status: Normal (Preliminary result)   Collection Time   07/18/11 12:55 PM      Component Value Range Status Comment   Specimen Description BLOOD LEFT ANTECUBITAL   Final    Special Requests BOTTLES DRAWN AEROBIC AND ANAEROBIC   Final    Culture  Setup Time [id number]   Final    Culture     Final    Value:        BLOOD CULTURE RECEIVED NO GROWTH TO DATE CULTURE WILL BE HELD FOR 5 DAYS BEFORE ISSUING A FINAL NEGATIVE REPORT   Report Status PENDING   Incomplete   CULTURE, BLOOD (ROUTINE  X 2)     Status: Normal (Preliminary result)   Collection Time   07/18/11  1:02 PM      Component Value Range Status Comment   Specimen Description BLOOD LEFT FOREARM   Final    Special Requests BOTTLES DRAWN AEROBIC AND ANAEROBIC   Final    Culture  Setup Time [id number]   Final    Culture     Final    Value:        BLOOD CULTURE RECEIVED NO GROWTH TO DATE CULTURE WILL BE HELD FOR 5 DAYS BEFORE ISSUING A FINAL NEGATIVE REPORT   Report Status PENDING   Incomplete   MRSA PCR SCREENING     Status: Abnormal   Collection Time   07/18/11  2:47 PM      Component Value Range Status Comment   MRSA by PCR POSITIVE (*) NEGATIVE  Final   CLOSTRIDIUM DIFFICILE BY PCR     Status: Abnormal   Collection Time   07/21/11  5:05 PM      Component Value Range Status Comment   C difficile by pcr POSITIVE (*) NEGATIVE  Final     Studies/Results: No results found.  Medications: Scheduled Meds:   . albuterol  2.5 mg Nebulization BID  . amLODipine  5 mg Oral Daily  . aspirin  81 mg Oral Daily  . calcium carbonate  1,250 mg Oral BID  . Chlorhexidine Gluconate Cloth  6 each Topical Q0600  . enoxaparin  40 mg Subcutaneous Q24H  . feeding supplement  237 mL Oral TID WC  . feeding supplement  1 Container Oral TID WC  . ferrous sulfate  325 mg Oral Q breakfast  . furosemide  20 mg Oral Daily  . levofloxacin  750 mg Oral QPC supper  . metroNIDAZOLE  500 mg Oral Q8H  . mulitivitamin with minerals  1 tablet Oral Daily  . mupirocin ointment  1 application Nasal BID  . pantoprazole  40 mg Oral Q1200  . potassium chloride SA  20 mEq Oral BID  . saccharomyces boulardii  250 mg Oral BID   Continuous Infusions:  PRN Meds:.albuterol, guaiFENesin-dextromethorphan, ondansetron (ZOFRAN) IV, ondansetron  Assessment/Plan:  Principal Problem:  *PNA (pneumonia)  - clinically improving  -  will continue to monitor vitals per floor protocol  - continue antibiotics PO  - currently maintaining oxygen  saturations > 95% on 2 L [place]  - continue to provide supportive care, nebulizers prn and scheduled   Active Problems:  Diarrhea  - nonbloody and secondary to C. Diff - start Flagyl 500 mg PO TID - follow vitals per floor protocol  Anemia of chronic disease with anemia or iron deficiency  - Hg stable compared to yesterday  - FOBT +, anemia panel indicative of iron deficiency anemia  - please note that pt had colonoscopy 02/2010 done by Dr. Ewing Schlein and has shown divarticular bleed and polyp in ascending colon  - CBC in AM but hold of on transfusion today  - if Hg <7.5 in Am will go ahead and transfuse   Diastolic CHF, acute on chronic  - will continue low dose Lasix as renal function currently tolerating  - there is mild congestion on physical exam   HTN (hypertension)  - continue Lasix for now as noted above but hold if SBP < 90 mmHg   CKD (chronic kidney disease), stage I  - creatinine remains stable and at pt's baseline  - BMP in AM   Leukocytosis  - likely related to principal problem, slowly trending down  - will continue abx as noted above  - CBC in AM   GERD (gastroesophageal reflux disease)  - continue PPI    LOS: 5 days   MAGICK-Jovee Dettinger 07/22/2011, 5:13 PM  [facility] Pager: [phone]

## 2011-07-23 DIAGNOSIS — Z5189 Encounter for other specified aftercare: Secondary | ICD-10-CM | POA: Diagnosis not present

## 2011-07-23 DIAGNOSIS — K5900 Constipation, unspecified: Secondary | ICD-10-CM | POA: Diagnosis not present

## 2011-07-23 DIAGNOSIS — Z79899 Other long term (current) drug therapy: Secondary | ICD-10-CM | POA: Diagnosis not present

## 2011-07-23 DIAGNOSIS — R259 Unspecified abnormal involuntary movements: Secondary | ICD-10-CM | POA: Diagnosis not present

## 2011-07-23 DIAGNOSIS — N179 Acute kidney failure, unspecified: Secondary | ICD-10-CM | POA: Diagnosis not present

## 2011-07-23 DIAGNOSIS — K219 Gastro-esophageal reflux disease without esophagitis: Secondary | ICD-10-CM | POA: Diagnosis not present

## 2011-07-23 DIAGNOSIS — J449 Chronic obstructive pulmonary disease, unspecified: Secondary | ICD-10-CM | POA: Diagnosis not present

## 2011-07-23 DIAGNOSIS — E46 Unspecified protein-calorie malnutrition: Secondary | ICD-10-CM | POA: Diagnosis not present

## 2011-07-23 DIAGNOSIS — M6281 Muscle weakness (generalized): Secondary | ICD-10-CM | POA: Diagnosis not present

## 2011-07-23 DIAGNOSIS — M1990 Unspecified osteoarthritis, unspecified site: Secondary | ICD-10-CM | POA: Diagnosis not present

## 2011-07-23 DIAGNOSIS — N189 Chronic kidney disease, unspecified: Secondary | ICD-10-CM | POA: Diagnosis not present

## 2011-07-23 DIAGNOSIS — J189 Pneumonia, unspecified organism: Secondary | ICD-10-CM | POA: Diagnosis not present

## 2011-07-23 DIAGNOSIS — E876 Hypokalemia: Secondary | ICD-10-CM | POA: Diagnosis not present

## 2011-07-23 DIAGNOSIS — M300 Polyarteritis nodosa: Secondary | ICD-10-CM | POA: Diagnosis not present

## 2011-07-23 DIAGNOSIS — I5033 Acute on chronic diastolic (congestive) heart failure: Secondary | ICD-10-CM | POA: Diagnosis not present

## 2011-07-23 DIAGNOSIS — K2100 Gastro-esophageal reflux disease with esophagitis, without bleeding: Secondary | ICD-10-CM | POA: Diagnosis not present

## 2011-07-23 DIAGNOSIS — L8990 Pressure ulcer of unspecified site, unspecified stage: Secondary | ICD-10-CM | POA: Diagnosis not present

## 2011-07-23 DIAGNOSIS — I509 Heart failure, unspecified: Secondary | ICD-10-CM | POA: Diagnosis not present

## 2011-07-23 DIAGNOSIS — D72829 Elevated white blood cell count, unspecified: Secondary | ICD-10-CM | POA: Diagnosis not present

## 2011-07-23 DIAGNOSIS — Z7982 Long term (current) use of aspirin: Secondary | ICD-10-CM | POA: Diagnosis not present

## 2011-07-23 DIAGNOSIS — F0390 Unspecified dementia without behavioral disturbance: Secondary | ICD-10-CM | POA: Diagnosis not present

## 2011-07-23 DIAGNOSIS — Z8701 Personal history of pneumonia (recurrent): Secondary | ICD-10-CM | POA: Diagnosis not present

## 2011-07-23 DIAGNOSIS — R0602 Shortness of breath: Secondary | ICD-10-CM | POA: Diagnosis not present

## 2011-07-23 DIAGNOSIS — R489 Unspecified symbolic dysfunctions: Secondary | ICD-10-CM | POA: Diagnosis not present

## 2011-07-23 DIAGNOSIS — N181 Chronic kidney disease, stage 1: Secondary | ICD-10-CM | POA: Diagnosis not present

## 2011-07-23 DIAGNOSIS — R6510 Systemic inflammatory response syndrome (SIRS) of non-infectious origin without acute organ dysfunction: Secondary | ICD-10-CM | POA: Diagnosis not present

## 2011-07-23 DIAGNOSIS — M6250 Muscle wasting and atrophy, not elsewhere classified, unspecified site: Secondary | ICD-10-CM | POA: Diagnosis not present

## 2011-07-23 DIAGNOSIS — R112 Nausea with vomiting, unspecified: Secondary | ICD-10-CM | POA: Diagnosis not present

## 2011-07-23 DIAGNOSIS — I5030 Unspecified diastolic (congestive) heart failure: Secondary | ICD-10-CM | POA: Diagnosis not present

## 2011-07-23 DIAGNOSIS — D509 Iron deficiency anemia, unspecified: Secondary | ICD-10-CM | POA: Diagnosis not present

## 2011-07-23 DIAGNOSIS — L84 Corns and callosities: Secondary | ICD-10-CM | POA: Diagnosis not present

## 2011-07-23 DIAGNOSIS — I129 Hypertensive chronic kidney disease with stage 1 through stage 4 chronic kidney disease, or unspecified chronic kidney disease: Secondary | ICD-10-CM | POA: Diagnosis not present

## 2011-07-23 DIAGNOSIS — L89609 Pressure ulcer of unspecified heel, unspecified stage: Secondary | ICD-10-CM | POA: Diagnosis not present

## 2011-07-23 DIAGNOSIS — R262 Difficulty in walking, not elsewhere classified: Secondary | ICD-10-CM | POA: Diagnosis not present

## 2011-07-23 DIAGNOSIS — A0472 Enterocolitis due to Clostridium difficile, not specified as recurrent: Secondary | ICD-10-CM | POA: Diagnosis not present

## 2011-07-23 DIAGNOSIS — I10 Essential (primary) hypertension: Secondary | ICD-10-CM | POA: Diagnosis not present

## 2011-07-23 LAB — BASIC METABOLIC PANEL
CO2: 31 mEq/L (ref 19–32)
Calcium: 8.9 mg/dL (ref 8.4–10.5)
Creatinine, Ser: 0.76 mg/dL (ref 0.50–1.10)
GFR calc Af Amer: 83 mL/min — ABNORMAL LOW (ref >90)
GFR calc non Af Amer: 72 mL/min — ABNORMAL LOW (ref >90)
Sodium: 136 mEq/L (ref 135–145)

## 2011-07-23 LAB — CBC
MCH: 28.4 pg (ref 26.0–34.0)
MCHC: 32.3 g/dL (ref 30.0–36.0)
MCV: 88.1 fL (ref 78.0–100.0)
Platelets: 419 10*3/uL — ABNORMAL HIGH (ref 150–400)
RDW: 15.2 % (ref 11.5–15.5)

## 2011-07-23 MED ORDER — LEVOFLOXACIN 750 MG PO TABS: 750.0000 mg | ORAL_TABLET | Freq: Every day | ORAL | Status: AC

## 2011-07-23 MED ORDER — METRONIDAZOLE 500 MG PO TABS: 500.0000 mg | ORAL_TABLET | Freq: Three times a day (TID) | ORAL | Status: AC

## 2011-07-23 NOTE — Progress Notes (Signed)
Patient set to discharge to [facility] today. Son made aware. [facility] called for transport.   Unice Bailey, LCSWA [phone]

## 2011-07-23 NOTE — Discharge Summary (Signed)
Patient ID: Sarah Perkins MRN: [id number] DOB/AGE: 1918/04/23 78 y.o.  Admit date: 07/17/2011 Discharge date: 07/23/2011  Primary Care Physician:  Thayer Headings, MD, MD  Discharge Diagnoses:  Pneumonia, hospital acquired  Present on Admission:  .Leukocytosis .GERD (gastroesophageal reflux disease) .Diastolic CHF, acute on chronic .CKD (chronic kidney disease), stage I .PNA (pneumonia)  Principal Problem:  *PNA (pneumonia) Active Problems:  Diastolic CHF, acute on chronic  HTN (hypertension)  CKD (chronic kidney disease), stage I  Leukocytosis  GERD (gastroesophageal reflux disease)   Medication List  As of 07/23/2011  3:09 PM   STOP taking these medications         moxifloxacin 400 MG tablet         TAKE these medications         acetaminophen 325 MG tablet   Commonly known as: TYLENOL   Take 2 tablets (650 mg total) by mouth every 6 (six) hours as needed for pain or fever (or Fever >/= 101).      amLODipine 5 MG tablet   Commonly known as: NORVASC   Take 1 tablet (5 mg total) by mouth daily.      aspirin 81 MG chewable tablet   Chew 81 mg by mouth daily.      CALCIUM 500 PO   Take 2 tablets by mouth daily.      docusate sodium 100 MG capsule   Commonly known as: COLACE   Take 100 mg by mouth 2 (two) times daily.      feeding supplement Liqd   Take 237 mLs by mouth 3 (three) times daily with meals.      ferrous sulfate 325 (65 FE) MG tablet   Take 325 mg by mouth daily with breakfast.      furosemide 20 MG tablet   Commonly known as: LASIX   Take 1 tablet (20 mg total) by mouth daily.      geriatric multivitamins-minerals Elix   Take 30 mLs by mouth daily.      guaiFENesin-dextromethorphan 100-10 MG/5ML syrup   Commonly known as: ROBITUSSIN DM   Take 5 mLs by mouth 4 (four) times daily as needed. As needed for cough      levofloxacin 750 MG tablet   Commonly known as: LEVAQUIN   Take 1 tablet (750 mg total) by mouth daily after supper.       metroNIDAZOLE 500 MG tablet   Commonly known as: FLAGYL   Take 1 tablet (500 mg total) by mouth every 8 (eight) hours.      omeprazole 20 MG capsule   Commonly known as: PRILOSEC   Take 20 mg by mouth daily as needed. For acid reflux      polyethylene glycol packet   Commonly known as: MIRALAX / GLYCOLAX   Take 17 g by mouth daily. Give in 6-8 oz of fluids daily. Hold for diarrhea.      potassium chloride SA 20 MEQ tablet   Commonly known as: K-DUR,KLOR-CON   Take 20 mEq by mouth 2 (two) times daily.      saccharomyces boulardii 250 MG capsule   Commonly known as: FLORASTOR   Take 250 mg by mouth 2 (two) times daily.      vitamin D (CHOLECALCIFEROL) 400 UNITS tablet   Take 400 Units by mouth daily.            Disposition and Follow-up: Pt will need to see primary provider at the facility within 2 weeks of discharge. Please note that  she has been given Levofloxacin to complete the therapy for hospital acquired PNA for 7 additional days. She was advised to stop taking Avelox. IN addition she has developed C. Diff colitis as noted per C. Diff PCR and her diarrheas were completed resolved by the time of the discharge. She will however, need to complete the therapy with Flagyl for additional 13 days and the presumptive end date is 08/03/2011. Please on follow up appointment evaluate if the pt is continuing to be diarrhea free.   Consults:  none  Significant Diagnostic Studies:  Dg Chest 2 View  07/17/2011  *RADIOLOGY REPORT*  Clinical Data: Cough, short of breath, weakness  CHEST - 2 VIEW  Comparison: Chest x-ray of 01/18, 01/17, and 05/15/2011  Findings: The lungs are hyperaerated consistent with COPD.  There is mild cardiomegaly present with small effusions and pulmonary vascular congestion.  There is prominent soft tissue in the right paratracheal region, possibly accentuated by slight rotation. Although this could represent ectatic vessels, mass, adenopathy, or possibly  aneurysm cannot be excluded and CT the chest with IV contrast media is recommended to assess further.  A partially compressed upper lumbar vertebral body appears stable.  IMPRESSION:  1.  Probable CHF with cardiomegaly, congestion,  and effusions. 2.  COPD. 3.  Prominent right paratracheal soft tissues as described above. Recommend CT of the chest with IV contrast if further assessment if warranted.  Original Report Authenticated By: Juline Patch, M.D.   Ct Chest W Contrast  07/17/2011  *RADIOLOGY REPORT*  Clinical Data: Shortness of breath, weakness, leukocytosis, and abnormal chest radiograph  CT CHEST WITH CONTRAST  Technique:  Multidetector CT imaging of the chest was performed following the standard protocol during bolus administration of intravenous contrast.  Contrast: 80mL OMNIPAQUE IOHEXOL 300 MG/ML IJ SOLN  Comparison: CT 10/14/2008, chest radiograph 07/17/2011  Findings: No axillary or supraclavicular lymphadenopathy.  There are enlarged paratracheal lymph nodes which accounts for the plain film abnormality.  For example 9 mm right lower paratracheal lymph node.  12 mm precarinal lymph node.  11 mm left lower paratracheal lymph node. There is enlarged left infrahilar lymph node measuring 11 millimeters (image 30).  Review lung parenchyma demonstrates moderate bibasilar consolidation with air bronchograms.  There is additional consolidated thickening along the oblique fissures on the left and right.  There is a branching nodular pattern along the horizontal fissure (image 23).  There are small bilateral pleural effusions.  Limited view of the upper abdomen shows normal adrenal glands. Kidneys enhance liver appear normal. Moderate size hiatal hernia is present.  Limited view of the skeleton is unremarkable.  IMPRESSION:  1. Paratracheal lymphadenopathy accounts for plain film abnormality.  Differential includes of lymphoproliferative disorder, reactive adenopathy, and less likely metastatic disease.  Lymph nodes are present on comparison CT from 2010 but normal size at that time. 2.  Consolidation in the bilateral lung bases along the oblique fissures.  Differential includes infection and neoplasm.  Original Report Authenticated By: Genevive Bi, M.D.    Brief H and P: Pt is 78 yo female, resident of [facility] facility who was diagnosed with LLL PNA 03/19 and mild vascular congestion and was started on Avelox 400 mg PO QD and Lasix 20 mg BID. Today she presents to [facility] ED with main concern of progressively worsening SOB even with the regimen she was prescribed one day ago and now associated with productive cough of yellowish sputum. In addition, pt's sone reports that pt's symptoms initially started 3-4  days prior to admission and she has had occasional subjective fevers, chills, and poor oral intake. Pt denies any nausea, vomiting, any abdominal or urinary concerns.   Physical Exam on Discharge:  Filed Vitals:   07/22/11 1504 07/22/11 2123 07/23/11 0609 07/23/11 1407  BP: 120/63 137/67 152/75 133/51  Pulse: 100 91 91 86  Temp: 97.6 F (36.4 C) 98.3 F (36.8 C) 98.2 F (36.8 C) 98 F (36.7 C)  TempSrc: Oral Oral Oral Oral  Resp: 18 18 18 22   Height:      Weight:      SpO2: 96% 98% 98% 100%     Intake/Output Summary (Last 24 hours) at 07/23/11 1509 Last data filed at 07/23/11 1440  Gross per 24 hour  Intake    480 ml  Output      0 ml  Net    480 ml    General: Alert, awake, oriented to name and place, follows commands appropriatelly, in no acute distress. HEENT: No bruits, no goiter. Heart: Regular rate and rhythm, without murmurs, rubs, gallops. Lungs: Clear to auscultation bilaterally. No rhonchi, no wheezing. Abdomen: Soft, nontender, nondistended, positive bowel sounds. Extremities: No clubbing cyanosis or edema with positive pedal pulses. Neuro: Grossly intact, nonfocal.  CBC:    Component Value Date/Time   WBC 14.1* 07/23/2011 0520   WBC 8.5 06/01/2009 1500    HGB 8.1* 07/23/2011 0520   HGB 11.8 06/01/2009 1500   HCT 25.1* 07/23/2011 0520   HCT 36.4 06/01/2009 1500   PLT 419* 07/23/2011 0520   PLT 328 06/01/2009 1500   MCV 88.1 07/23/2011 0520   MCV 92.6 06/01/2009 1500   NEUTROABS 19.2* 07/17/2011 1310   NEUTROABS 5.0 06/01/2009 1500   LYMPHSABS 2.5 07/17/2011 1310   LYMPHSABS 2.5 06/01/2009 1500   MONOABS 2.5* 07/17/2011 1310   MONOABS 0.8 06/01/2009 1500   EOSABS 0.0 07/17/2011 1310   EOSABS 0.1 06/01/2009 1500   BASOSABS 0.1 07/17/2011 1310   BASOSABS 0.1 06/01/2009 1500    Basic Metabolic Panel:    Component Value Date/Time   NA 136 07/23/2011 0520   K 4.0 07/23/2011 0520   CL 101 07/23/2011 0520   CO2 31 07/23/2011 0520   BUN 13 07/23/2011 0520   CREATININE 0.76 07/23/2011 0520   CREATININE 0.73 01/26/2009 1310   GLUCOSE 103* 07/23/2011 0520   CALCIUM 8.9 07/23/2011 0520    Hospital Course:   Principal Problem:  *PNA (pneumonia)  - pt was admitted to telemetry unit and had vitals monitored per floor protocol  - we started broad spectrum antibiotics as to treat as HCAP given the fact that pt lives at ALF  - she has completed 5 day therapy with broad spectrum abx: Vanc, Zosyn, Levaquin and has clinically improved - her abx were switch to PO and she will continue to take abx Levaquin for additional 7 days - currently maintaining oxygen saturations > 95% on 2 L [place]   Active Problems:  Diastolic CHF, acute on chronic  - we continued low dose Lasix as renal function was tolerating  - this has resolved and pt has remained clinically stable and CHF was compensated  DIARRHEA - this was determined to be secondary to C. Diff colitis and pt was started on PO Flagyl - she has shown great clinical response to Flagyl and has not had any diarrheas over 48 hour period - she will continue to take Flagyl in an outpatient setting for additional 13 days  HTN (hypertension)  -  continued Lasix   CKD (chronic kidney disease), stage I  - creatinine remained stable and  at pt's baseline   Leukocytosis  - likely related to principal problem  - we continued abx as noted above  - WBC trending down and pt remains afebrile over 72 hours  GERD (gastroesophageal reflux disease)  - continued PPI   Time spent on Discharge: Over 30 minutes  Signed: Debbora Presto 07/23/2011, 3:09 PM  [facility], pager #: [phone] Main office number: [phone]

## 2011-07-23 NOTE — Discharge Instructions (Signed)

## 2011-07-24 LAB — CULTURE, BLOOD (ROUTINE X 2): Culture  Setup Time: 201303211622

## 2011-08-22 DIAGNOSIS — K5900 Constipation, unspecified: Secondary | ICD-10-CM | POA: Diagnosis not present

## 2011-08-22 DIAGNOSIS — I10 Essential (primary) hypertension: Secondary | ICD-10-CM | POA: Diagnosis not present

## 2011-08-22 DIAGNOSIS — R259 Unspecified abnormal involuntary movements: Secondary | ICD-10-CM | POA: Diagnosis not present

## 2011-08-22 DIAGNOSIS — D509 Iron deficiency anemia, unspecified: Secondary | ICD-10-CM | POA: Diagnosis not present

## 2011-08-22 DIAGNOSIS — I5033 Acute on chronic diastolic (congestive) heart failure: Secondary | ICD-10-CM | POA: Diagnosis not present

## 2011-08-26 ENCOUNTER — Encounter (HOSPITAL_BASED_OUTPATIENT_CLINIC_OR_DEPARTMENT_OTHER): Payer: Medicare Other | Attending: Internal Medicine

## 2011-08-26 DIAGNOSIS — M300 Polyarteritis nodosa: Secondary | ICD-10-CM | POA: Insufficient documentation

## 2011-08-26 DIAGNOSIS — Z7982 Long term (current) use of aspirin: Secondary | ICD-10-CM | POA: Diagnosis not present

## 2011-08-26 DIAGNOSIS — J449 Chronic obstructive pulmonary disease, unspecified: Secondary | ICD-10-CM | POA: Insufficient documentation

## 2011-08-26 DIAGNOSIS — I5030 Unspecified diastolic (congestive) heart failure: Secondary | ICD-10-CM | POA: Insufficient documentation

## 2011-08-26 DIAGNOSIS — Z79899 Other long term (current) drug therapy: Secondary | ICD-10-CM | POA: Diagnosis not present

## 2011-08-26 DIAGNOSIS — K219 Gastro-esophageal reflux disease without esophagitis: Secondary | ICD-10-CM | POA: Diagnosis not present

## 2011-08-26 DIAGNOSIS — M1990 Unspecified osteoarthritis, unspecified site: Secondary | ICD-10-CM | POA: Diagnosis not present

## 2011-08-26 DIAGNOSIS — F0390 Unspecified dementia without behavioral disturbance: Secondary | ICD-10-CM | POA: Diagnosis not present

## 2011-08-26 DIAGNOSIS — N189 Chronic kidney disease, unspecified: Secondary | ICD-10-CM | POA: Diagnosis not present

## 2011-08-26 DIAGNOSIS — I129 Hypertensive chronic kidney disease with stage 1 through stage 4 chronic kidney disease, or unspecified chronic kidney disease: Secondary | ICD-10-CM | POA: Diagnosis not present

## 2011-08-26 DIAGNOSIS — L8990 Pressure ulcer of unspecified site, unspecified stage: Secondary | ICD-10-CM | POA: Diagnosis not present

## 2011-08-26 DIAGNOSIS — L84 Corns and callosities: Secondary | ICD-10-CM | POA: Insufficient documentation

## 2011-08-26 DIAGNOSIS — J4489 Other specified chronic obstructive pulmonary disease: Secondary | ICD-10-CM | POA: Insufficient documentation

## 2011-08-26 DIAGNOSIS — L89609 Pressure ulcer of unspecified heel, unspecified stage: Secondary | ICD-10-CM | POA: Insufficient documentation

## 2011-08-26 DIAGNOSIS — Z8701 Personal history of pneumonia (recurrent): Secondary | ICD-10-CM | POA: Diagnosis not present

## 2011-08-26 NOTE — Progress Notes (Signed)
[facility]  NAME:  Sarah Perkins, Sarah Perkins         ACCOUNT NO.:  [id number]  MEDICAL RECORD NO.:  [id number]      DATE OF BIRTH:  Aug 20, 1917  PHYSICIAN:  Jonelle Sports. Yaslene Lindamood, M.D.  VISIT DATE:  08/26/2011                                  OFFICE VISIT   HISTORY:  This 78 year old white female is seen for evaluation and treatment of a pressure ulcer on the right heel.  The patient was recently hospitalized with a bout of pneumonia being returned to the nursing care center on March 26th.  Apparently, at the Nursing Care Center she had developed sore on her sacral area which apparently has been brought to a resolution, but also a small painful ulcerated area on the posterior right heel.  This is continued to accumulate exophytic callus and eschar and has been painful for the patient and she is referred here accordingly for our evaluation and advice.  PAST MEDICAL HISTORY:  Significant for a brain aneurysm 40 years ago, which apparently left her with very little permanent impairment, right hip fracture with open reduction and internal fixation on February 10th, orthoscopic surgery of the right knee and a remote hysterectomy.  She has had medical hospitalizations in the past in conjunction with POD.  MEDICATIONS:  Her regular medication include aspirin 81 mg daily, Norvasc 5 mg daily, furosemide 20 mg daily, calcium of 1000 mg daily, Colace 100 mg b.i.d., elder tonic 30 mg daily, ferrous sulfate 325 mg daily, guaifenesin DM 5 mL p.r.n., omeprazole 20 mg daily, potassium chloride 20 mEq b.i.d., primidone 12.5 mg nightly, senna-S 1 tab daily, tramadol 50 mg nightly, vitamin C, vitamin D, and zinc on a daily basis and p.r.n. oxygen at 2 L per minute.  ALLERGIES:  She has no known medicinal allergies.  FAMILY HISTORY:  Not available and does not particularly pertinent.  SOCIAL HISTORY:  Personal history is notable for the fact that she now is confined to a nursing care  center who is she does have some element of dementia.  REVIEW OF SYSTEMS:  Polyarteritis nodosa and I am not sure if that is the reason for the primidone which I assume may well be a form of prednisone.  She also has diastolic heart failure, early chronic kidney disease, gastroesophageal reflux, hypertension, dementia as mentioned, COPD, and degenerative arthritis.  PHYSICAL EXAMINATION:  VITAL SIGNS:  Blood pressure is 125/72, pulse is 83 and regular, respirations 16, temperature 97.8, she is 5 feet 9 inches tall, weighs 150 pounds. GENERAL:  She is alert, cooperative, and her degree of dementia is not readily apparent on this examination. HEENT:  Her mucous membranes are moist and pink. NECK:  Her neck is reasonably supple for age.  Her neck veins are flat with heard about 25 degrees of elevation. HEART:  Her heart is regular in rhythm, with no definite murmur or gallop on limited examination. CHEST:  Clear grossly. GI:  Her abdomen is slightly protuberant, but soft and nontender without obvious organomegaly or masses. EXTREMITIES:  Her extremities are free of edema.  Her distal pulses are diminished, but there does not appear to be any significant ischemic changes in her distal lower extremities.  On the posterior aspect of the left heel is an area of pressure damage which seems to be covered with an  exuberant eschar and/or callus.  IMPRESSION:  Pressure ulcer, left heel.  Being uncertain what underlay this exuberant callus, I felt like it needed to be removed and this was done with unfortunately some small amount of pain to the patient.  To my satisfaction once this was done, the wound appears almost essentially healed underneath.  I suspect the source of her recent pain was her motion of this callus what appear to be sort of a stalk.  DISPOSITION:  The patient was treated with an application of silver alginate and a foam pad.  Instructions were sent to receiving  facility, we placed her in a soft protective heel boots and to use a pillow under her calves when she is in bed.  There were instructed also to be cautious about preventing development of other pressure lesions.  Anticipate that this wound will heal quite satisfactorily.  Now, that it has been debrided and I had indicated to them that should she healed it is not necessary that she return here, otherwise, they may send her back at any time if they feel like she has not satisfactorily healed or seems to be in any way worsening.          ______________________________ Jonelle Sports. Cheryll Cockayne, M.D.     RES/MEDQ  D:  08/26/2011  T:  08/26/2011  Job:  [id number]

## 2011-08-28 DIAGNOSIS — D72829 Elevated white blood cell count, unspecified: Secondary | ICD-10-CM | POA: Diagnosis not present

## 2011-08-28 DIAGNOSIS — N181 Chronic kidney disease, stage 1: Secondary | ICD-10-CM | POA: Diagnosis not present

## 2011-08-28 DIAGNOSIS — J189 Pneumonia, unspecified organism: Secondary | ICD-10-CM | POA: Diagnosis not present

## 2011-08-28 DIAGNOSIS — E876 Hypokalemia: Secondary | ICD-10-CM | POA: Diagnosis not present

## 2011-08-28 DIAGNOSIS — A0472 Enterocolitis due to Clostridium difficile, not specified as recurrent: Secondary | ICD-10-CM | POA: Diagnosis not present

## 2011-08-28 DIAGNOSIS — R262 Difficulty in walking, not elsewhere classified: Secondary | ICD-10-CM | POA: Diagnosis not present

## 2011-08-28 DIAGNOSIS — K219 Gastro-esophageal reflux disease without esophagitis: Secondary | ICD-10-CM | POA: Diagnosis not present

## 2011-08-28 DIAGNOSIS — E46 Unspecified protein-calorie malnutrition: Secondary | ICD-10-CM | POA: Diagnosis not present

## 2011-08-28 DIAGNOSIS — M6250 Muscle wasting and atrophy, not elsewhere classified, unspecified site: Secondary | ICD-10-CM | POA: Diagnosis not present

## 2011-08-28 DIAGNOSIS — R489 Unspecified symbolic dysfunctions: Secondary | ICD-10-CM | POA: Diagnosis not present

## 2011-08-28 DIAGNOSIS — R6510 Systemic inflammatory response syndrome (SIRS) of non-infectious origin without acute organ dysfunction: Secondary | ICD-10-CM | POA: Diagnosis not present

## 2011-08-28 DIAGNOSIS — Z5189 Encounter for other specified aftercare: Secondary | ICD-10-CM | POA: Diagnosis not present

## 2011-08-28 DIAGNOSIS — I509 Heart failure, unspecified: Secondary | ICD-10-CM | POA: Diagnosis not present

## 2011-08-28 DIAGNOSIS — I5033 Acute on chronic diastolic (congestive) heart failure: Secondary | ICD-10-CM | POA: Diagnosis not present

## 2011-08-28 DIAGNOSIS — M6281 Muscle weakness (generalized): Secondary | ICD-10-CM | POA: Diagnosis not present

## 2011-09-09 DIAGNOSIS — L89609 Pressure ulcer of unspecified heel, unspecified stage: Secondary | ICD-10-CM | POA: Diagnosis not present

## 2011-09-09 DIAGNOSIS — L8992 Pressure ulcer of unspecified site, stage 2: Secondary | ICD-10-CM | POA: Diagnosis not present

## 2011-09-09 DIAGNOSIS — R269 Unspecified abnormalities of gait and mobility: Secondary | ICD-10-CM | POA: Diagnosis not present

## 2011-09-09 DIAGNOSIS — M6281 Muscle weakness (generalized): Secondary | ICD-10-CM | POA: Diagnosis not present

## 2011-09-09 DIAGNOSIS — I509 Heart failure, unspecified: Secondary | ICD-10-CM | POA: Diagnosis not present

## 2011-09-10 DIAGNOSIS — R269 Unspecified abnormalities of gait and mobility: Secondary | ICD-10-CM | POA: Diagnosis not present

## 2011-09-10 DIAGNOSIS — I509 Heart failure, unspecified: Secondary | ICD-10-CM | POA: Diagnosis not present

## 2011-09-10 DIAGNOSIS — M6281 Muscle weakness (generalized): Secondary | ICD-10-CM | POA: Diagnosis not present

## 2011-09-10 DIAGNOSIS — L89609 Pressure ulcer of unspecified heel, unspecified stage: Secondary | ICD-10-CM | POA: Diagnosis not present

## 2011-09-10 DIAGNOSIS — L8992 Pressure ulcer of unspecified site, stage 2: Secondary | ICD-10-CM | POA: Diagnosis not present

## 2011-09-11 DIAGNOSIS — I509 Heart failure, unspecified: Secondary | ICD-10-CM | POA: Diagnosis not present

## 2011-09-11 DIAGNOSIS — M6281 Muscle weakness (generalized): Secondary | ICD-10-CM | POA: Diagnosis not present

## 2011-09-11 DIAGNOSIS — L89609 Pressure ulcer of unspecified heel, unspecified stage: Secondary | ICD-10-CM | POA: Diagnosis not present

## 2011-09-11 DIAGNOSIS — R269 Unspecified abnormalities of gait and mobility: Secondary | ICD-10-CM | POA: Diagnosis not present

## 2011-09-11 DIAGNOSIS — L8992 Pressure ulcer of unspecified site, stage 2: Secondary | ICD-10-CM | POA: Diagnosis not present

## 2011-09-12 DIAGNOSIS — R609 Edema, unspecified: Secondary | ICD-10-CM | POA: Diagnosis not present

## 2011-09-12 DIAGNOSIS — R413 Other amnesia: Secondary | ICD-10-CM | POA: Diagnosis not present

## 2011-09-12 DIAGNOSIS — J159 Unspecified bacterial pneumonia: Secondary | ICD-10-CM | POA: Diagnosis not present

## 2011-09-12 DIAGNOSIS — A0472 Enterocolitis due to Clostridium difficile, not specified as recurrent: Secondary | ICD-10-CM | POA: Diagnosis not present

## 2011-09-13 DIAGNOSIS — M6281 Muscle weakness (generalized): Secondary | ICD-10-CM | POA: Diagnosis not present

## 2011-09-13 DIAGNOSIS — L8992 Pressure ulcer of unspecified site, stage 2: Secondary | ICD-10-CM | POA: Diagnosis not present

## 2011-09-13 DIAGNOSIS — R269 Unspecified abnormalities of gait and mobility: Secondary | ICD-10-CM | POA: Diagnosis not present

## 2011-09-13 DIAGNOSIS — I509 Heart failure, unspecified: Secondary | ICD-10-CM | POA: Diagnosis not present

## 2011-09-13 DIAGNOSIS — L89609 Pressure ulcer of unspecified heel, unspecified stage: Secondary | ICD-10-CM | POA: Diagnosis not present

## 2011-09-16 DIAGNOSIS — M6281 Muscle weakness (generalized): Secondary | ICD-10-CM | POA: Diagnosis not present

## 2011-09-16 DIAGNOSIS — I509 Heart failure, unspecified: Secondary | ICD-10-CM | POA: Diagnosis not present

## 2011-09-16 DIAGNOSIS — R269 Unspecified abnormalities of gait and mobility: Secondary | ICD-10-CM | POA: Diagnosis not present

## 2011-09-16 DIAGNOSIS — L8992 Pressure ulcer of unspecified site, stage 2: Secondary | ICD-10-CM | POA: Diagnosis not present

## 2011-09-16 DIAGNOSIS — L89609 Pressure ulcer of unspecified heel, unspecified stage: Secondary | ICD-10-CM | POA: Diagnosis not present

## 2011-09-17 DIAGNOSIS — I509 Heart failure, unspecified: Secondary | ICD-10-CM | POA: Diagnosis not present

## 2011-09-17 DIAGNOSIS — L89609 Pressure ulcer of unspecified heel, unspecified stage: Secondary | ICD-10-CM | POA: Diagnosis not present

## 2011-09-17 DIAGNOSIS — L8992 Pressure ulcer of unspecified site, stage 2: Secondary | ICD-10-CM | POA: Diagnosis not present

## 2011-09-17 DIAGNOSIS — M6281 Muscle weakness (generalized): Secondary | ICD-10-CM | POA: Diagnosis not present

## 2011-09-17 DIAGNOSIS — R269 Unspecified abnormalities of gait and mobility: Secondary | ICD-10-CM | POA: Diagnosis not present

## 2011-09-19 ENCOUNTER — Encounter (HOSPITAL_COMMUNITY): Payer: Self-pay | Admitting: Emergency Medicine

## 2011-09-19 ENCOUNTER — Inpatient Hospital Stay (HOSPITAL_COMMUNITY)
Admission: EM | Admit: 2011-09-19 | Discharge: 2011-09-25 | DRG: 378 | Disposition: A | Discharge status: Skilled Nursing Facility | Payer: Medicare Other | Attending: Internal Medicine | Admitting: Internal Medicine

## 2011-09-19 DIAGNOSIS — D62 Acute posthemorrhagic anemia: Secondary | ICD-10-CM | POA: Diagnosis present

## 2011-09-19 DIAGNOSIS — Z86010 Personal history of colonic polyps: Secondary | ICD-10-CM | POA: Diagnosis not present

## 2011-09-19 DIAGNOSIS — I10 Essential (primary) hypertension: Secondary | ICD-10-CM | POA: Diagnosis not present

## 2011-09-19 DIAGNOSIS — I5032 Chronic diastolic (congestive) heart failure: Secondary | ICD-10-CM | POA: Diagnosis present

## 2011-09-19 DIAGNOSIS — R112 Nausea with vomiting, unspecified: Secondary | ICD-10-CM | POA: Diagnosis not present

## 2011-09-19 DIAGNOSIS — K5732 Diverticulitis of large intestine without perforation or abscess without bleeding: Secondary | ICD-10-CM | POA: Diagnosis present

## 2011-09-19 DIAGNOSIS — K922 Gastrointestinal hemorrhage, unspecified: Secondary | ICD-10-CM | POA: Diagnosis not present

## 2011-09-19 DIAGNOSIS — A0472 Enterocolitis due to Clostridium difficile, not specified as recurrent: Secondary | ICD-10-CM | POA: Diagnosis present

## 2011-09-19 DIAGNOSIS — N181 Chronic kidney disease, stage 1: Secondary | ICD-10-CM

## 2011-09-19 DIAGNOSIS — D509 Iron deficiency anemia, unspecified: Secondary | ICD-10-CM | POA: Diagnosis not present

## 2011-09-19 DIAGNOSIS — I129 Hypertensive chronic kidney disease with stage 1 through stage 4 chronic kidney disease, or unspecified chronic kidney disease: Secondary | ICD-10-CM | POA: Diagnosis not present

## 2011-09-19 DIAGNOSIS — K625 Hemorrhage of anus and rectum: Principal | ICD-10-CM | POA: Diagnosis present

## 2011-09-19 DIAGNOSIS — I509 Heart failure, unspecified: Secondary | ICD-10-CM | POA: Diagnosis present

## 2011-09-19 DIAGNOSIS — K5792 Diverticulitis of intestine, part unspecified, without perforation or abscess without bleeding: Secondary | ICD-10-CM

## 2011-09-19 DIAGNOSIS — Z7982 Long term (current) use of aspirin: Secondary | ICD-10-CM

## 2011-09-19 DIAGNOSIS — K921 Melena: Secondary | ICD-10-CM | POA: Diagnosis not present

## 2011-09-19 DIAGNOSIS — Z1211 Encounter for screening for malignant neoplasm of colon: Secondary | ICD-10-CM | POA: Diagnosis not present

## 2011-09-19 DIAGNOSIS — K219 Gastro-esophageal reflux disease without esophagitis: Secondary | ICD-10-CM

## 2011-09-19 DIAGNOSIS — K5790 Diverticulosis of intestine, part unspecified, without perforation or abscess without bleeding: Secondary | ICD-10-CM

## 2011-09-19 HISTORY — DX: Personal history of other diseases of the digestive system: Z87.19

## 2011-09-19 HISTORY — DX: Diverticulitis of intestine, part unspecified, without perforation or abscess without bleeding: K57.92

## 2011-09-19 HISTORY — DX: Acute posthemorrhagic anemia: D62

## 2011-09-19 HISTORY — DX: Benign neoplasm of colon, unspecified: D12.6

## 2011-09-19 LAB — CBC
MCH: 29.6 pg (ref 26.0–34.0)
Platelets: 329 10*3/uL (ref 150–400)
RBC: 3.18 MIL/uL — ABNORMAL LOW (ref 3.87–5.11)
RDW: 14.9 % (ref 11.5–15.5)

## 2011-09-19 LAB — COMPREHENSIVE METABOLIC PANEL
AST: 15 U/L (ref 0–37)
Alkaline Phosphatase: 47 U/L (ref 39–117)
BUN: 17 mg/dL (ref 6–23)
CO2: 26 mEq/L (ref 19–32)
Chloride: 105 mEq/L (ref 96–112)
Creatinine, Ser: 0.73 mg/dL (ref 0.50–1.10)
GFR calc non Af Amer: 72 mL/min — ABNORMAL LOW (ref >90)
Total Bilirubin: 0.2 mg/dL — ABNORMAL LOW (ref 0.3–1.2)

## 2011-09-19 LAB — CLOSTRIDIUM DIFFICILE BY PCR: Toxigenic C. Difficile by PCR: POSITIVE — AB

## 2011-09-19 LAB — PROTIME-INR
INR: 1.06 (ref 0.00–1.49)
Prothrombin Time: 14 seconds (ref 11.6–15.2)

## 2011-09-19 LAB — DIFFERENTIAL
Basophils Absolute: 0.1 10*3/uL (ref 0.0–0.1)
Basophils Relative: 1 % (ref 0–1)
Eosinophils Absolute: 0.8 10*3/uL — ABNORMAL HIGH (ref 0.0–0.7)
Lymphs Abs: 2.5 10*3/uL (ref 0.7–4.0)
Neutrophils Relative %: 60 % (ref 43–77)

## 2011-09-19 LAB — MRSA PCR SCREENING: MRSA by PCR: NEGATIVE

## 2011-09-19 MED ORDER — SODIUM CHLORIDE 0.9 % IV SOLN
8.0000 mg/h | INTRAVENOUS | Status: DC
  Administered 2011-09-19: 8 mg/h via INTRAVENOUS
  Filled 2011-09-19 (×3): qty 80

## 2011-09-19 MED ORDER — ACETAMINOPHEN 650 MG RE SUPP: 650.0000 mg | Freq: Four times a day (QID) | RECTAL | Status: DC | PRN

## 2011-09-19 MED ORDER — ONDANSETRON HCL 4 MG PO TABS: 4.0000 mg | ORAL_TABLET | Freq: Four times a day (QID) | ORAL | Status: DC | PRN

## 2011-09-19 MED ORDER — ACETAMINOPHEN 325 MG PO TABS: 650.0000 mg | ORAL_TABLET | Freq: Four times a day (QID) | ORAL | Status: DC | PRN

## 2011-09-19 MED ORDER — HYDRALAZINE HCL 20 MG/ML IJ SOLN
5.0000 mg | Freq: Three times a day (TID) | INTRAMUSCULAR | Status: DC | PRN
  Filled 2011-09-19: qty 0.25

## 2011-09-19 MED ORDER — SODIUM CHLORIDE 0.9 % IV BOLUS (SEPSIS)
500.0000 mL | Freq: Once | INTRAVENOUS | Status: AC
  Administered 2011-09-19: 500 mL via INTRAVENOUS

## 2011-09-19 MED ORDER — DEXTROSE-NACL 5-0.9 % IV SOLN
INTRAVENOUS | Status: AC
  Administered 2011-09-20: 21:00:00 via INTRAVENOUS

## 2011-09-19 MED ORDER — SODIUM CHLORIDE 0.9 % IV SOLN: INTRAVENOUS | Status: DC

## 2011-09-19 MED ORDER — SODIUM CHLORIDE 0.9 % IV SOLN: INTRAVENOUS | Status: AC

## 2011-09-19 MED ORDER — ONDANSETRON HCL 4 MG/2ML IJ SOLN: 4.0000 mg | Freq: Four times a day (QID) | INTRAMUSCULAR | Status: DC | PRN

## 2011-09-19 MED ORDER — PANTOPRAZOLE SODIUM 40 MG IV SOLR
40.0000 mg | Freq: Once | INTRAVENOUS | Status: AC
  Administered 2011-09-19: 40 mg via INTRAVENOUS
  Filled 2011-09-19: qty 40

## 2011-09-19 NOTE — ED Provider Notes (Signed)
History     CSN: [id number]  Arrival date & time 09/19/11  1357   First MD Initiated Contact with Patient 09/19/11 1524     4:40 PM HPI Pt reports today has had 2 episodes of bright red blood in stool. Reports a significant amount of blood and therefore decided to come to be evaluated. Denies abdominal pain, hemorrhoids, rectal pain, nausea, vomiting, fever, back pain, urinary symptoms. Denies anticoagulant use, or similar symptoms previously  Patient is a 78 y.o. female presenting with hematochezia. The history is provided by the patient.  Rectal Bleeding  The current episode started today. The onset was sudden. Episode frequency: during bowel movementt. The problem has been unchanged. The patient is experiencing no pain. The stool is described as mixed with blood and bloody. Pertinent negatives include no fever, no abdominal pain, no diarrhea, no nausea, no rectal pain, no vomiting, no hematuria, no vaginal discharge, no chest pain and no headaches. The diarrhea is bloody and semi-solid. She has been behaving normally. Her past medical history does not include abdominal surgery, Hirschsprung's disease, inflammatory bowel disease or recent abdominal injury.    Past Medical History  Diagnosis Date  . CHF (congestive heart failure)   . Hypertension   . Aneurysm 1972    cerebral  . Acid reflux     occasional  . Tremor     worse on right arm  . Diverticulitis     Past Surgical History  Procedure Date  . Fracture surgery     History reviewed. No pertinent family history.  History  Substance Use Topics  . Smoking status: Never Smoker   . Smokeless tobacco: Never Used  . Alcohol Use: No    OB History    Grav Para Term Preterm Abortions TAB SAB Ect Mult Living                  Review of Systems  Constitutional: Negative for fever, chills and diaphoresis.  Eyes: Negative for visual disturbance.  Respiratory: Negative for shortness of breath.   Cardiovascular: Negative for  chest pain.  Gastrointestinal: Positive for blood in stool, hematochezia and anal bleeding. Negative for nausea, vomiting, abdominal pain, diarrhea, constipation, abdominal distention and rectal pain.  Genitourinary: Negative for dysuria, urgency, frequency, hematuria, flank pain, vaginal discharge and vaginal pain.  Musculoskeletal: Negative for back pain.  Neurological: Negative for dizziness and headaches.  All other systems reviewed and are negative.    Allergies  Review of patient's allergies indicates no known allergies.  Home Medications   Current Outpatient Rx  Name Route Sig Dispense Refill  . ACETAMINOPHEN 325 MG PO TABS Oral Take 2 tablets (650 mg total) by mouth every 6 (six) hours as needed for pain or fever (or Fever >/= 101).    . AMLODIPINE BESYLATE 5 MG PO TABS Oral Take 1 tablet (5 mg total) by mouth daily.    . ASPIRIN 81 MG PO CHEW Oral Chew 81 mg by mouth daily.    Marland Kitchen CALCIUM 500 PO Oral Take 2 tablets by mouth daily.     Marland Kitchen VITAMIN D 1000 UNITS PO TABS Oral Take 1,000 Units by mouth daily.    Marland Kitchen DOCUSATE SODIUM 100 MG PO CAPS Oral Take 100 mg by mouth 2 (two) times daily.    Marland Kitchen ENSURE IMMUNE HEALTH PO LIQD Oral Take 237 mLs by mouth 3 (three) times daily with meals.    Di Kindle SULFATE 325 (65 FE) MG PO TABS Oral Take 325 mg  by mouth daily with breakfast.    . FUROSEMIDE 20 MG PO TABS Oral Take 1 tablet (20 mg total) by mouth daily.    Marland Kitchen OMEPRAZOLE 20 MG PO CPDR Oral Take 20 mg by mouth daily as needed. For acid reflux    . POTASSIUM CHLORIDE CRYS ER 20 MEQ PO TBCR Oral Take 20 mEq by mouth 2 (two) times daily.      BP 118/76  Pulse 104  Temp(Src) 97.9 F (36.6 C) (Oral)  Resp 18  SpO2 96%  Physical Exam  Vitals reviewed. Constitutional: She is oriented to person, place, and time. Vital signs are normal. She appears well-developed and well-nourished.  HENT:  Head: Normocephalic and atraumatic.  Mouth/Throat: Oropharynx is clear and moist. No oropharyngeal  exudate.  Eyes: Conjunctivae are normal. Pupils are equal, round, and reactive to light.  Neck: Normal range of motion. Neck supple.  Cardiovascular: Normal rate, regular rhythm and normal heart sounds.  Exam reveals no friction rub.   No murmur heard. Pulmonary/Chest: Effort normal and breath sounds normal. She has no wheezes. She has no rhonchi. She has no rales. She exhibits no tenderness.  Abdominal: Soft. Bowel sounds are normal. She exhibits no distension and no mass. There is tenderness (mild diffuse). There is no rebound and no guarding.  Genitourinary: Rectal exam shows no external hemorrhoid and no fissure. Guaiac positive stool (dark red/tarry stool).  Musculoskeletal: Normal range of motion.  Neurological: She is alert and oriented to person, place, and time.  Skin: Skin is warm and dry. No rash noted. No erythema. There is pallor.    ED Course  Procedures   Results for orders placed during the hospital encounter of 09/19/11  COMPREHENSIVE METABOLIC PANEL      Component Value Range   Sodium 140  135 - 145 (mEq/L)   Potassium 3.9  3.5 - 5.1 (mEq/L)   Chloride 105  96 - 112 (mEq/L)   CO2 26  19 - 32 (mEq/L)   Glucose, Bld 137 (*) 70 - 99 (mg/dL)   BUN 17  6 - 23 (mg/dL)   Creatinine, Ser 4.09  0.50 - 1.10 (mg/dL)   Calcium 9.5  8.4 - 81.1 (mg/dL)   Total Protein 8.2  6.0 - 8.3 (g/dL)   Albumin 2.6 (*) 3.5 - 5.2 (g/dL)   AST 15  0 - 37 (U/L)   ALT 6  0 - 35 (U/L)   Alkaline Phosphatase 47  39 - 117 (U/L)   Total Bilirubin 0.2 (*) 0.3 - 1.2 (mg/dL)   GFR calc non Af Amer 72 (*) >90 (mL/min)   GFR calc Af Amer 84 (*) >90 (mL/min)  CBC      Component Value Range   WBC 10.6 (*) 4.0 - 10.5 (K/uL)   RBC 3.18 (*) 3.87 - 5.11 (MIL/uL)   Hemoglobin 9.4 (*) 12.0 - 15.0 (g/dL)   HCT 91.4 (*) 78.2 - 46.0 (%)   MCV 90.6  78.0 - 100.0 (fL)   MCH 29.6  26.0 - 34.0 (pg)   MCHC 32.6  30.0 - 36.0 (g/dL)   RDW 95.6  21.3 - 08.6 (%)   Platelets 329  150 - 400 (K/uL)  DIFFERENTIAL        Component Value Range   Neutrophils Relative 60  43 - 77 (%)   Neutro Abs 6.3  1.7 - 7.7 (K/uL)   Lymphocytes Relative 23  12 - 46 (%)   Lymphs Abs 2.5  0.7 - 4.0 (K/uL)  Monocytes Relative 9  3 - 12 (%)   Monocytes Absolute 0.9  0.1 - 1.0 (K/uL)   Eosinophils Relative 8 (*) 0 - 5 (%)   Eosinophils Absolute 0.8 (*) 0.0 - 0.7 (K/uL)   Basophils Relative 1  0 - 1 (%)   Basophils Absolute 0.1  0.0 - 0.1 (K/uL)  PROTIME-INR      Component Value Range   Prothrombin Time 14.0  11.6 - 15.2 (seconds)   INR 1.06  0.00 - 1.49   APTT      Component Value Range   aPTT 30  24 - 37 (seconds)  OCCULT BLOOD, POC DEVICE      Component Value Range   Fecal Occult Bld POSITIVE      MDM   Patient mildly anemic. No acute findings on exam with exception of dark tarry stool. Will have patient admitted for GI bleed. Discussed plan with Dr. Effie Shy agrees the patient should be admitted for GI bleed. I discussed this with patient she agrees to plan as well.   5:47 PM Spoke with Dr. Venetia Constable, [facility], he will accept patient for admission to team to. Request consult with gastroenterology.   6:10 PM Spoke with lower GI. They will see the patient for consult.      Thomasene Lot, PA-C 09/19/11 1811

## 2011-09-19 NOTE — Progress Notes (Signed)
Pt admitted to the unit. Pt is alert and oriented. Pt oriented to room, staff, and call bell. Bed in lowest position. Full assessment to Epic. Call bell with in reach. Told to call for assists. Will continue to monitor.  Sarah Perkins  

## 2011-09-19 NOTE — ED Provider Notes (Signed)
Sarah Perkins is a 78 y.o. female with rectal bleeding that started today. She's had several episodes. The son states he saw bright red blood. The patient is hemodynamically stable. Her abdomen is soft and nontender. Her hemoglobin is at baseline. She'll need to be admitted for management and observation.  Medical screening examination/treatment/procedure(s) were conducted as a shared visit with non-physician practitioner(s) and myself.  I personally evaluated the patient during the encounter  Flint Melter, MD 09/20/11 [id number]

## 2011-09-19 NOTE — ED Notes (Signed)
Pt c/o BRB rectal bleeding starting this am; pt sts hx of diverticulosis; pt denies anticoagulation; pt from home

## 2011-09-19 NOTE — ED Notes (Signed)
Protonix requested to be sent down.

## 2011-09-19 NOTE — ED Notes (Signed)
Pt states she had 2 episodes of passing bright red blood in the toilet. Pt states "it didn't seem like too much but I don't really know." Pt states that shes never had anything like this before. Pt denies any abd pain, n/v.

## 2011-09-19 NOTE — H&P (Signed)
PCP:   Thayer Headings, MD, MD   Chief Complaint:  Rectal bleeding since this morning  HPI: Sarah Perkins was brought in to the ED by her son after she noticed some painless rectal bleeding which started this morning. She saw bright red blood, but was dark colored at some point. No hematemesis./ no fever. She takes a baby aspirin, but no other nsaids. She had similar bleeding in 2011, apparently diverticular bleed. Per her son, [facility] showed polyps. Patient denies dizziness, or palpitations or SOB. Her hemoglobin was 9.4 g/dl at presentation, higher than her baseline  Of 8-8.8 in last admission in March this year. BP was 118-151 systolic.  Review of Systems:  Unremarkable except as highlighted in the hpi.  Past Medical History: Past Medical History  Diagnosis Date  . CHF (congestive heart failure)   . Hypertension   . Aneurysm 1972    cerebral  . Acid reflux     occasional  . Tremor     worse on right arm  . Diverticulitis    Past Surgical History  Procedure Date  . Fracture surgery     Medications: Prior to Admission medications   Medication Sig Start Date End Date Taking? Authorizing Provider  acetaminophen (TYLENOL) 325 MG tablet Take 2 tablets (650 mg total) by mouth every 6 (six) hours as needed for pain or fever (or Fever >/= 101). 05/21/11 05/20/12 Yes Vassie Loll, MD  amLODipine (NORVASC) 5 MG tablet Take 1 tablet (5 mg total) by mouth daily. 05/21/11 05/20/12 Yes Vassie Loll, MD  aspirin 81 MG chewable tablet Chew 81 mg by mouth daily.   Yes Historical Provider, MD  Calcium Carbonate (CALCIUM 500 PO) Take 2 tablets by mouth daily.    Yes Historical Provider, MD  cholecalciferol (VITAMIN D) 1000 UNITS tablet Take 1,000 Units by mouth daily.   Yes Historical Provider, MD  docusate sodium (COLACE) 100 MG capsule Take 100 mg by mouth 2 (two) times daily.   Yes Historical Provider, MD  feeding supplement (ENSURE IMMUNE HEALTH) LIQD Take 237 mLs by mouth  3 (three) times daily with meals. 05/21/11  Yes Vassie Loll, MD  ferrous sulfate 325 (65 FE) MG tablet Take 325 mg by mouth daily with breakfast.   Yes Historical Provider, MD  furosemide (LASIX) 20 MG tablet Take 1 tablet (20 mg total) by mouth daily. 05/21/11 05/20/12 Yes Vassie Loll, MD  omeprazole (PRILOSEC) 20 MG capsule Take 20 mg by mouth daily as needed. For acid reflux   Yes Historical Provider, MD  potassium chloride SA (K-DUR,KLOR-CON) 20 MEQ tablet Take 20 mEq by mouth 2 (two) times daily.   Yes Historical Provider, MD    Allergies:  No Known Allergies  Social History:  reports that she has never smoked. She has never used smokeless tobacco. She reports that she does not drink alcohol or use illicit drugs. Left [facility] 2 weeks ago, and lives with her son.  Family History: History reviewed. No pertinent family history.  Physical Exam: Filed Vitals:   09/19/11 1630 09/19/11 1700 09/19/11 1705 09/19/11 1840  BP: 124/59 141/59 141/59 151/61  Pulse: 95 93 96 99  Temp:    98.1 F (36.7 C)  TempSrc:    Oral  Resp:   16   SpO2: 95% 95% 95% 96%   Comfortable at rest. No jvd. Lungs clear. S1S2 heard. No murmurs. RRR. Abdomen- soft, non tender. No palpable masses or organomegaly.+BS. CNS- grossly Intact. Extremities- no pedal edema.  Labs on Admission:   [facility] 09/19/11 1423  NA 140  K 3.9  CL 105  CO2 26  GLUCOSE 137*  BUN 17  CREATININE 0.73  CALCIUM 9.5  MG --  PHOS --    Basename 09/19/11 1423  AST 15  ALT 6  ALKPHOS 47  BILITOT 0.2*  PROT 8.2  ALBUMIN 2.6*   No results found for this basename: LIPASE:2,AMYLASE:2 in the last 72 hours  Basename 09/19/11 1423  WBC 10.6*  NEUTROABS 6.3  HGB 9.4*  HCT 28.8*  MCV 90.6  PLT 329   No results found for this basename: CKTOTAL:3,CKMB:3,CKMBINDEX:3,TROPONINI:3 in the last 72 hours No results found for this basename: TSH,T4TOTAL,FREET3,T3FREE,THYROIDAB in the last 72 hours No results found  for this basename: VITAMINB12:2,FOLATE:2,FERRITIN:2,TIBC:2,IRON:2,RETICCTPCT:2 in the last 72 hours  Radiological Exams on Admission: No results found.  Assessment  Painless rectal bleeding in 78 year old female with diverticulosis. Likely has a diverticular bleed. She is hemodynamically stable. She may be hemo concentrated.  Plan  * Rectal bleeding- likely diverticular. Admit med/surg. Serial h/h. Transfuse if hemoglobin drops below 8. Give crystalloids. NPO for now. No NSAIDS. Continue ppi for now. Dr Arlyce Dice consulted by the ED. He will graciously see patient later. * Htn- hold oral meds- hydralazine prn. SCD's. Condition guarded.  Sarah Perkins [phone]. 09/19/2011, 6:46 PM

## 2011-09-20 ENCOUNTER — Encounter (HOSPITAL_COMMUNITY): Payer: Self-pay | Admitting: Physician Assistant

## 2011-09-20 ENCOUNTER — Inpatient Hospital Stay (HOSPITAL_COMMUNITY): Payer: Medicare Other

## 2011-09-20 DIAGNOSIS — K219 Gastro-esophageal reflux disease without esophagitis: Secondary | ICD-10-CM

## 2011-09-20 DIAGNOSIS — D62 Acute posthemorrhagic anemia: Secondary | ICD-10-CM | POA: Diagnosis present

## 2011-09-20 DIAGNOSIS — A0472 Enterocolitis due to Clostridium difficile, not specified as recurrent: Secondary | ICD-10-CM | POA: Diagnosis present

## 2011-09-20 DIAGNOSIS — K922 Gastrointestinal hemorrhage, unspecified: Secondary | ICD-10-CM

## 2011-09-20 DIAGNOSIS — K625 Hemorrhage of anus and rectum: Secondary | ICD-10-CM

## 2011-09-20 DIAGNOSIS — I10 Essential (primary) hypertension: Secondary | ICD-10-CM

## 2011-09-20 DIAGNOSIS — R112 Nausea with vomiting, unspecified: Secondary | ICD-10-CM

## 2011-09-20 LAB — CBC
HCT: 18.8 % — ABNORMAL LOW (ref 36.0–46.0)
MCH: 28.9 pg (ref 26.0–34.0)
MCHC: 33.5 g/dL (ref 30.0–36.0)
MCV: 86 fL (ref 78.0–100.0)
Platelets: 240 10*3/uL (ref 150–400)
Platelets: 238 10*3/uL (ref 150–400)
RBC: 3.15 MIL/uL — ABNORMAL LOW (ref 3.87–5.11)
RDW: 15.1 % (ref 11.5–15.5)
RDW: 16.2 % — ABNORMAL HIGH (ref 11.5–15.5)

## 2011-09-20 LAB — BASIC METABOLIC PANEL
BUN: 17 mg/dL (ref 6–23)
GFR calc Af Amer: 84 mL/min — ABNORMAL LOW (ref >90)
GFR calc non Af Amer: 73 mL/min — ABNORMAL LOW (ref >90)
Potassium: 3.5 mEq/L (ref 3.5–5.1)

## 2011-09-20 LAB — HEMOGLOBIN AND HEMATOCRIT, BLOOD: HCT: 27.2 % — ABNORMAL LOW (ref 36.0–46.0)

## 2011-09-20 MED ORDER — PANTOPRAZOLE SODIUM 40 MG PO TBEC
40.0000 mg | DELAYED_RELEASE_TABLET | Freq: Every day | ORAL | Status: DC
  Administered 2011-09-21 – 2011-09-25 (×5): 40 mg via ORAL
  Filled 2011-09-20 (×5): qty 1

## 2011-09-20 MED ORDER — FUROSEMIDE 10 MG/ML IJ SOLN
20.0000 mg | Freq: Once | INTRAMUSCULAR | Status: AC
  Administered 2011-09-20: 20 mg via INTRAVENOUS
  Filled 2011-09-20: qty 2

## 2011-09-20 MED ORDER — TECHNETIUM TC 99M-LABELED RED BLOOD CELLS IV KIT
25.0000 | PACK | Freq: Once | INTRAVENOUS | Status: AC | PRN
  Administered 2011-09-20: 25 via INTRAVENOUS

## 2011-09-20 NOTE — Progress Notes (Signed)
Pt. Had a large bloody stool. Awaiting CBC. Will notify on-call MD with results.

## 2011-09-20 NOTE — Progress Notes (Signed)
PATIENT DETAILS Name: Sarah Perkins Age: 78 y.o. Sex: female Date of Birth: October 21, 1917 Admit Date: 09/19/2011 UJW:[id number],FAOZH, MD, MD  Subjective: Had hematochezia this am as well  Objective: Vital signs in last 24 hours: Filed Vitals:   09/20/11 1040 09/20/11 1114 09/20/11 1240 09/20/11 1245  BP: 121/70 129/71 126/71 123/70  Pulse: 95 93 87 88  Temp: 97.9 F (36.6 C) 98.2 F (36.8 C) 97.9 F (36.6 C) 97.8 F (36.6 C)  TempSrc: Oral Oral Oral Oral  Resp: 20 18 19 16   Height:      Weight:      SpO2:  94%      Weight change:   Body mass index is 23.02 kg/(m^2).  Intake/Output from previous day:  Intake/Output Summary (Last 24 hours) at 09/20/11 1309 Last data filed at 09/20/11 1245  Gross per 24 hour  Intake 607.08 ml  Output      0 ml  Net 607.08 ml    PHYSICAL EXAM: Gen Exam: Awake and alert with clear speech.   Neck: Supple, No JVD.   Chest: B/L Clear.   CVS: S1 S2 Regular, no murmurs.  Abdomen: soft, BS +, non tender, non distended.  Extremities: no edema, lower extremities warm to touch. Neurologic: Non Focal.   Skin: No Rash.   Wounds: N/A.    CONSULTS:  GI  LAB RESULTS: CBC  Lab 09/20/11 0308 09/19/11 1423  WBC 9.1 10.6*  HGB 6.3* 9.4*  HCT 18.8* 28.8*  PLT 240 329  MCV 88.7 90.6  MCH 29.7 29.6  MCHC 33.5 32.6  RDW 15.1 14.9  LYMPHSABS -- 2.5  MONOABS -- 0.9  EOSABS -- 0.8*  BASOSABS -- 0.1  BANDABS -- --    Chemistries   Lab 09/20/11 0309 09/19/11 1423  NA 142 140  K 3.5 3.9  CL 111 105  CO2 25 26  GLUCOSE 100* 137*  BUN 17 17  CREATININE 0.72 0.73  CALCIUM 8.4 9.5  MG -- --    GFR Estimated Creatinine Clearance: 44.5 ml/min (by C-G formula based on Cr of 0.72).  Coagulation profile  Lab 09/19/11 1423  INR 1.06  PROTIME --    Cardiac Enzymes No results found for this basename: CK:3,CKMB:3,TROPONINI:3,MYOGLOBIN:3 in the last 168 hours  No components found with this basename: POCBNP:3 No results  found for this basename: DDIMER:2 in the last 72 hours No results found for this basename: HGBA1C:2 in the last 72 hours No results found for this basename: CHOL:2,HDL:2,LDLCALC:2,TRIG:2,CHOLHDL:2,LDLDIRECT:2 in the last 72 hours No results found for this basename: TSH,T4TOTAL,FREET3,T3FREE,THYROIDAB in the last 72 hours No results found for this basename: VITAMINB12:2,FOLATE:2,FERRITIN:2,TIBC:2,IRON:2,RETICCTPCT:2 in the last 72 hours No results found for this basename: LIPASE:2,AMYLASE:2 in the last 72 hours  Urine Studies No results found for this basename: UACOL:2,UAPR:2,USPG:2,UPH:2,UTP:2,UGL:2,UKET:2,UBIL:2,UHGB:2,UNIT:2,UROB:2,ULEU:2,UEPI:2,UWBC:2,URBC:2,UBAC:2,CAST:2,CRYS:2,UCOM:2,BILUA:2 in the last 72 hours  MICROBIOLOGY: Recent Results (from the past 240 hour(s))  CLOSTRIDIUM DIFFICILE BY PCR     Status: Abnormal   Collection Time   09/19/11  6:07 PM      Component Value Range Status Comment   C difficile by pcr POSITIVE (*) NEGATIVE  Final   MRSA PCR SCREENING     Status: Normal   Collection Time   09/19/11  8:13 PM      Component Value Range Status Comment   MRSA by PCR NEGATIVE  NEGATIVE  Final     RADIOLOGY STUDIES/RESULTS: No results found.  MEDICATIONS: Scheduled Meds:   . sodium chloride   Intravenous STAT  .  furosemide  20 mg Intravenous Once  . pantoprazole  40 mg Oral Q0600  . pantoprazole (PROTONIX) IV  40 mg Intravenous Once  . sodium chloride  500 mL Intravenous Once   Continuous Infusions:   . dextrose 5 % and 0.9% NaCl    . DISCONTD: sodium chloride    . DISCONTD: pantoprozole (PROTONIX) infusion 8 mg/hr (09/19/11 1838)   PRN Meds:.acetaminophen, acetaminophen, hydrALAZINE, ondansetron (ZOFRAN) IV, ondansetron  Antibiotics: Anti-infectives    None      Assessment/Plan: Patient Active Hospital Problem List: Bright red rectal bleeding  -high suspicion for diverticular bleed -appreciate GI input -for bleeding scan today -will continue to  provide supportive care-hopefully this bleed will resolve spontaneously soon  Acute Blood Loss Anemia -secondary to above -currently s/p 2 units PRBC-5/24 -check H/H periodically -transfuse prn  Pseudomembranous colitis  -recent h/o C Diff Colitis in March 2013 -Currently denies any diarrhea to me -per GI-no need to treat for this currently-felt PCR C Diff positive as a sequale of her recent infection and not an acute infection  HTN -BP currently controlled without use of any anti-hypertensives  History of CHF-Diastolic -currently compensated -EF per last Echo-1/13-60-65%  Disposition: Remain inpatient  DVT Prophylaxis: SCD's   Maretta Bees,  MD. 09/20/2011, 1:09 PM

## 2011-09-20 NOTE — Progress Notes (Signed)
Pt had another large thick, dark red bloody stool.

## 2011-09-20 NOTE — Consult Note (Signed)
Pt seen and examined.  Full note to follow.  Impression 1) acute LGI bleed.  Suspect diverticular bleed, though with [facility] one may sometimes see bleeding.   2) [facility]  Recommendations 1) hold GI w/u unless bleeding intensifies, at which point I would obtain a bleeding scan. 2) transfuse to Hg 8-9 3) begin flagyl.

## 2011-09-20 NOTE — Consult Note (Signed)
[facility] Consult: 10:50 AM 09/20/2011   Referring Provider: Dr Venetia Constable  Primary Care Physician:  Thayer Headings, MD Primary Gastroenterologist:  Dr Dulce Sellar, Linton Rump saw as inpt in 2011, they decline to see her now.   Reason for Consultation:  Hematochezia.   HPI: Sarah Perkins is a 78 y.o. female. In 2011 she had painless hematochezia. Dr Ewing Schlein clipped an oozing sigmoid diverticula, removed a small adenomatous polyp and noted hemorrhoids and scattered tics.  She received 6 or more units of blood for blood loss anemia.  Pt on Plavix and ASA at time of bleed. Current med list includes 81 mg aspirin.  No other blood thinners, platelet inhibitors and she takes Omeprazole daily along with po Iron   Presented to [facility] hospital afternoon of 5/23 with painless rectal bleeding that began yesterday morning at home.  First episode was stool mixed with blood, all subsequent stools, several yesterday and at least 2 today.  No abdominal pain or nausea.  No dizzyness or weakness. Stool is medium to dark red, currant jelly type appearance.  Hgb initially 9.4.  Compares to 8.0 in March 2013. Hgb dropped to 6.3 this AM and she has received 2 units of blood.  Stool is testing positive for C Diff as it did in 07/21/2011.  She was admitted for one week in late March 2013 for pneumonia, treated with Levaquin (7 days oupt to complete Rx) in conjunction with PO Metronidazole, 500 mg TID (2 weeks therapy to completion after discharge)  and Florastor.  Diarrhea had resolved at time of the discharge on 07/23/11. It has not recurred.  Generally has about 5 formed stools scattered throughout the week.     REVIEW OF SYSTEMS: 14 system review with noted pertinents: Anorexia for at least one year, no clear early satiety.  No heartburn or nausea No headaches, dizziness, numbness No hematuria.  Pos for urine incontinence.  No chills, sweats, fever.  Right heal  wound cared for by home health nurse.  No rash. Sad because her eldest of 2 sons, who lived with his mom and brother at the home, succumbed to COPD one month ago. Her younger son takes good care of her at home. She mobilizes mostly with wheelchair. She can mobilize with walker if she has assistance to prevent falls.  No chest pain or SOB.  No swelling in feet.  Wt may be down 5 but less than 10 # over last year or so.      Past Medical History  Diagnosis Date  . CHF (congestive heart failure)   . Hypertension   . Aneurysm 1972    cerebral  . Acid reflux     occasional  . Tremor     worse on right arm  . History of GI diverticular bleed 06/2009    colonoscopy with endo clipping of bleeding tic by Dr Vida Rigger.  Flex sig by Dr Dulce Sellar.  . Acute blood loss anemia 06/2009    received ~ 6 units blood  . Adenomatous polyp of colon 06/2009    Past Surgical History  Procedure Date  . Hip fracture surgery 2011  . Colonoscopy w/ control of hemorrhage 06/2009  . Knee arthroscopy   . Cerebral aneurysm repair 1970's    Prior to Admission medications   Medication Sig Start Date End Date Taking? Authorizing Provider  acetaminophen (TYLENOL) 325 MG tablet Take 2 tablets (650 mg total) by mouth every 6 (six) hours as needed for pain or fever (or Fever >/=  101). 05/21/11 05/20/12 Yes Vassie Loll, MD  amLODipine (NORVASC) 5 MG tablet Take 1 tablet (5 mg total) by mouth daily. 05/21/11 05/20/12 Yes Vassie Loll, MD  aspirin 81 MG chewable tablet Chew 81 mg by mouth daily.   Yes Historical Provider, MD  Calcium Carbonate (CALCIUM 500 PO) Take 2 tablets by mouth daily.    Yes Historical Provider, MD  cholecalciferol (VITAMIN D) 1000 UNITS tablet Take 1,000 Units by mouth daily.   Yes Historical Provider, MD  docusate sodium (COLACE) 100 MG capsule Take 100 mg by mouth 2 (two) times daily.   Yes Historical Provider, MD  feeding supplement (ENSURE IMMUNE HEALTH) LIQD Take 237 mLs by mouth 3 (three)  times daily with meals. 05/21/11  Yes Vassie Loll, MD  ferrous sulfate 325 (65 FE) MG tablet Take 325 mg by mouth daily with breakfast.   Yes Historical Provider, MD  furosemide (LASIX) 20 MG tablet Take 1 tablet (20 mg total) by mouth daily. 05/21/11 05/20/12 Yes Vassie Loll, MD  omeprazole (PRILOSEC) 20 MG capsule Take 20 mg by mouth daily as needed. For acid reflux   Yes Historical Provider, MD  potassium chloride SA (K-DUR,KLOR-CON) 20 MEQ tablet Take 20 mEq by mouth 2 (two) times daily.   Yes Historical Provider, MD    Scheduled Meds:    . sodium chloride   Intravenous STAT  . furosemide  20 mg Intravenous Once  . pantoprazole (PROTONIX) IV  40 mg Intravenous Once  . sodium chloride  500 mL Intravenous Once   Infusions:    . dextrose 5 % and 0.9% NaCl    . pantoprozole (PROTONIX) infusion 8 mg/hr (09/19/11 1838)  . DISCONTD: sodium chloride     PRN Meds: acetaminophen, acetaminophen, hydrALAZINE, ondansetron (ZOFRAN) IV, ondansetron   Allergies as of 09/19/2011  . (No Known Allergies)    History reviewed. No pertinent family history.  History   Social History  . Marital Status: Divorced    Spouse Name: N/A    Number of Children: N/A  . Years of Education: N/A   Occupational History  . Not on file.   Social History Main Topics  . Smoking status: Never Smoker   . Smokeless tobacco: Never Used  . Alcohol Use: No  . Drug Use: No  . Sexually Active: No   Other Topics Concern  . Not on file   Social History Narrative  . No narrative on file    PHYSICAL EXAM: Vital signs in last 24 hours: Temp:  [97.8 F (36.6 C)-98.3 F (36.8 C)] 97.9 F (36.6 C) (05/24 1040) Pulse Rate:  [91-104] 95  (05/24 1040) Resp:  [16-20] 20  (05/24 1040) BP: (115-151)/(52-76) 121/70 mmHg (05/24 1040) SpO2:  [93 %-96 %] 93 % (05/24 0730) Weight:  [147 lb (66.679 kg)] 147 lb (66.679 kg) (05/23 2007)  General: looks well, aged WF.  Not particularly frail looking.  Head:  No  signs trauma.  Symmetric face.  Eyes:  EOMI, conjunct pink Ears:  HOH Nose:  No bleeding or discharge Mouth:  Native teeth with moderate amount of decay Neck:  No JVD, bruits, masses Lungs:  Clear.  Unlabored resp.  Heart: RRR.  S1 S2 audible.  No MRG Abdomen:  Soft, small umbilical hernia, no masses, bruits, HSM.   Rectal: clots of jelly like blood, around 50 cc in volume,  at rectum.  Hemorrhoids visible but doubt these are the source of the bleed    Musc/Skeltl: joints of hands, knees with  arthritic changes Extremities:  Slight non-pitting pedal edema.  Right heal wrapped.   Neurologic:  Appropriate.  Oriented x 3.  Good historian.  Moves all 4 limbs. Skin:  No rash, sores visulaized Tattoos:  none Nodes:  No adenopathy at neck or groin   Psych:  Pleasant, calm, flat affect.    LAB RESULTS:   Ref. Range 07/21/2011 06:27 07/22/2011 05:05 07/23/2011 05:20 09/19/2011 14:23 09/20/2011 03:08  WBC Latest Range: 4.0-10.5 K/uL 16.3 (H) 15.5 (H) 14.1 (H) 10.6 (H) 9.1  RBC Latest Range: 3.87-5.11 MIL/uL 2.79 (L) 2.91 (L) 2.85 (L) 3.18 (L) 2.12 (L)  Hemoglobin Latest Range: 12.0-15.0 g/dL 8.0 (L) 8.3 (L) 8.1 (L) 9.4 (L) 6.3 (LL)  HCT Latest Range: 36.0-46.0 % 24.2 (L) 25.4 (L) 25.1 (L) 28.8 (L) 18.8 (L)  MCV Latest Range: 78.0-100.0 fL 86.7 87.3 88.1 90.6 88.7  MCH Latest Range: 26.0-34.0 pg 28.7 28.5 28.4 29.6 29.7  MCHC Latest Range: 30.0-36.0 g/dL 13.0 86.5 78.4 69.6 29.5  RDW Latest Range: 11.5-15.5 % 15.1 15.3 15.2 14.9 15.1     BMET Lab Results  Component Value Date   NA 142 09/20/2011   NA 140 09/19/2011   NA 136 07/23/2011   K 3.5 09/20/2011   K 3.9 09/19/2011   K 4.0 07/23/2011   CL 111 09/20/2011   CL 105 09/19/2011   CL 101 07/23/2011   CO2 25 09/20/2011   CO2 26 09/19/2011   CO2 31 07/23/2011   GLUCOSE 100* 09/20/2011   GLUCOSE 137* 09/19/2011   GLUCOSE 103* 07/23/2011   BUN 17 09/20/2011   BUN 17 09/19/2011   BUN 13 07/23/2011   CREATININE 0.72 09/20/2011   CREATININE 0.73  09/19/2011   CREATININE 0.76 07/23/2011   CALCIUM 8.4 09/20/2011   CALCIUM 9.5 09/19/2011   CALCIUM 8.9 07/23/2011   LFT  Basename 09/19/11 1423  PROT 8.2  ALBUMIN 2.6*  AST 15  ALT 6  ALKPHOS 47  BILITOT 0.2*  BILIDIR --  IBILI --   PT/INR Lab Results  Component Value Date   INR 1.06 09/19/2011   INR 1.14 05/15/2011   INR 1.16 08/22/2009    RADIOLOGY STUDIES: No results found.  ENDOSCOPIC STUDIES: 08/24/2009   Flex Sig for recurrent rectal bleeding,   Dr Dulce Sellar No active bleeding seen, did find blood but no source.   08/15/2009   Colonoscopy   Dr Ewing Schlein For hematochezia.  Oozing sigmoid tic was clipped x2 and injected with epi.  Tics noted throughout colon.  INt and Ext hemorrhoids noted. Cold biopsy of ascending polyp, path:  Adenomatous, no high grade dysplasia.   IMPRESSION: *  Hematochezia.  Suspect recurrent diverticular bleed *  Stool C diff PCR positive. Completed 2 weeks of oral Flagyl beginning 07/22/11.  Diarrhea is not active.  PCR can remain positive long after clinical resolution of colitis. Per Up to Date: "Treatment of C. difficile is not indicated in patients who have a positive toxin assay but are asymptomatic" *  Anemia.  Normocytic.   S/P transfusion with 2 units PRBC.  Chronic ongoing issue. *  Hyperglycemia.  No hx diabetes.  *  Stage 1 chronic Kidney dz. BUN/Creat normal currently.  PLAN: *  Does not need abx for the positive C Diff PCR. *  Clear liquids *  Nuc Med RBC scan, I ordered this.   *  Support with further transfusions as indicated by serial CBCs.  *  D/C the Protonix drip, this is not an UGI bleed. Resume  daily po PPI. *  Hold the ASA    LOS: 1 day   Jennye Moccasin  09/20/2011, 10:50 AM Pager: [phone]

## 2011-09-21 DIAGNOSIS — K219 Gastro-esophageal reflux disease without esophagitis: Secondary | ICD-10-CM

## 2011-09-21 DIAGNOSIS — K922 Gastrointestinal hemorrhage, unspecified: Secondary | ICD-10-CM

## 2011-09-21 DIAGNOSIS — R112 Nausea with vomiting, unspecified: Secondary | ICD-10-CM

## 2011-09-21 DIAGNOSIS — K625 Hemorrhage of anus and rectum: Secondary | ICD-10-CM

## 2011-09-21 DIAGNOSIS — I10 Essential (primary) hypertension: Secondary | ICD-10-CM

## 2011-09-21 LAB — CBC
HCT: 29.7 % — ABNORMAL LOW (ref 36.0–46.0)
MCH: 28.7 pg (ref 26.0–34.0)
MCHC: 34.3 g/dL (ref 30.0–36.0)
RDW: 16.7 % — ABNORMAL HIGH (ref 11.5–15.5)

## 2011-09-21 LAB — HEMOGLOBIN AND HEMATOCRIT, BLOOD
HCT: 23.1 % — ABNORMAL LOW (ref 36.0–46.0)
HCT: 26.3 % — ABNORMAL LOW (ref 36.0–46.0)
Hemoglobin: 9.2 g/dL — ABNORMAL LOW (ref 12.0–15.0)

## 2011-09-21 MED ORDER — FUROSEMIDE 10 MG/ML IJ SOLN
20.0000 mg | Freq: Once | INTRAMUSCULAR | Status: AC
  Administered 2011-09-21: 20 mg via INTRAVENOUS
  Filled 2011-09-21: qty 2

## 2011-09-21 MED ORDER — ACETAMINOPHEN 325 MG PO TABS: 650.0000 mg | ORAL_TABLET | Freq: Once | ORAL | Status: DC

## 2011-09-21 MED ORDER — ACETAMINOPHEN 325 MG PO TABS
650.0000 mg | ORAL_TABLET | Freq: Once | ORAL | Status: AC
  Administered 2011-09-21: 650 mg via ORAL
  Filled 2011-09-21: qty 2

## 2011-09-21 MED ORDER — FUROSEMIDE 10 MG/ML IJ SOLN
20.0000 mg | Freq: Once | INTRAMUSCULAR | Status: DC
  Filled 2011-09-21: qty 2

## 2011-09-21 MED ORDER — DIPHENHYDRAMINE HCL 50 MG/ML IJ SOLN
25.0000 mg | Freq: Once | INTRAMUSCULAR | Status: AC
  Administered 2011-09-21: 25 mg via INTRAVENOUS
  Filled 2011-09-21: qty 1

## 2011-09-21 MED ORDER — PEG 3350-KCL-NA BICARB-NACL 420 G PO SOLR
4000.0000 mL | Freq: Once | ORAL | Status: DC
  Filled 2011-09-21: qty 4000

## 2011-09-21 MED ORDER — PEG 3350-KCL-NA BICARB-NACL 420 G PO SOLR
4000.0000 mL | Freq: Once | ORAL | Status: AC
  Administered 2011-09-21: 4000 mL via ORAL
  Filled 2011-09-21: qty 4000

## 2011-09-21 MED ORDER — DIPHENHYDRAMINE HCL 50 MG/ML IJ SOLN: 25.0000 mg | Freq: Once | INTRAMUSCULAR | Status: DC

## 2011-09-21 NOTE — Progress Notes (Addendum)
PATIENT DETAILS Name: Sarah Perkins Age: 78 y.o. Sex: female Date of Birth: Dec 30, 1917 Admit Date: 09/19/2011 WUJ:[id number],NFAOZ, MD, MD  Subjective: Numerous episodes of hematochezia throughout the night and in this am as well Objective: Vital signs in last 24 hours: Filed Vitals:   09/21/11 0554 09/21/11 0640 09/21/11 1025 09/21/11 1045  BP: 129/69 128/65 133/69 127/67  Pulse: 88 81 94 90  Temp: 98 F (36.7 C) 98 F (36.7 C) 98 F (36.7 C) 98.1 F (36.7 C)  TempSrc: Oral Oral Oral Oral  Resp: 16 18 18 18   Height:      Weight:      SpO2:        Weight change:   Body mass index is 23.02 kg/(m^2).  Intake/Output from previous day:  Intake/Output Summary (Last 24 hours) at 09/21/11 1144 Last data filed at 09/21/11 1030  Gross per 24 hour  Intake 965.83 ml  Output      0 ml  Net 965.83 ml    PHYSICAL EXAM: Gen Exam: Awake and alert with clear speech.   Neck: Supple, No JVD.   Chest: B/L Clear.  No rales or rhonchi CVS: S1 S2 Regular, no murmurs.  Abdomen: soft, BS +, non tender, non distended.  Extremities: no edema, lower extremities warm to touch. Neurologic: Non Focal.   Skin: No Rash.   Wounds: N/A.    CONSULTS:  GI  LAB RESULTS: CBC  Lab 09/21/11 0934 09/21/11 0206 09/20/11 1816 09/20/11 1407 09/20/11 0308 09/19/11 1423  WBC -- -- -- 9.6 9.1 10.6*  HGB 9.2* 7.9* 9.4* 9.1* 6.3* --  HCT 26.3* 23.1* 27.2* 27.1* 18.8* --  PLT -- -- -- 238 240 329  MCV -- -- -- 86.0 88.7 90.6  MCH -- -- -- 28.9 29.7 29.6  MCHC -- -- -- 33.6 33.5 32.6  RDW -- -- -- 16.2* 15.1 14.9  LYMPHSABS -- -- -- -- -- 2.5  MONOABS -- -- -- -- -- 0.9  EOSABS -- -- -- -- -- 0.8*  BASOSABS -- -- -- -- -- 0.1  BANDABS -- -- -- -- -- --    Chemistries   Lab 09/20/11 0309 09/19/11 1423  NA 142 140  K 3.5 3.9  CL 111 105  CO2 25 26  GLUCOSE 100* 137*  BUN 17 17  CREATININE 0.72 0.73  CALCIUM 8.4 9.5  MG -- --    GFR Estimated Creatinine Clearance: 44.5 ml/min  (by C-G formula based on Cr of 0.72).  Coagulation profile  Lab 09/19/11 1423  INR 1.06  PROTIME --    Cardiac Enzymes No results found for this basename: CK:3,CKMB:3,TROPONINI:3,MYOGLOBIN:3 in the last 168 hours  No components found with this basename: POCBNP:3 No results found for this basename: DDIMER:2 in the last 72 hours No results found for this basename: HGBA1C:2 in the last 72 hours No results found for this basename: CHOL:2,HDL:2,LDLCALC:2,TRIG:2,CHOLHDL:2,LDLDIRECT:2 in the last 72 hours No results found for this basename: TSH,T4TOTAL,FREET3,T3FREE,THYROIDAB in the last 72 hours No results found for this basename: VITAMINB12:2,FOLATE:2,FERRITIN:2,TIBC:2,IRON:2,RETICCTPCT:2 in the last 72 hours No results found for this basename: LIPASE:2,AMYLASE:2 in the last 72 hours  Urine Studies No results found for this basename: UACOL:2,UAPR:2,USPG:2,UPH:2,UTP:2,UGL:2,UKET:2,UBIL:2,UHGB:2,UNIT:2,UROB:2,ULEU:2,UEPI:2,UWBC:2,URBC:2,UBAC:2,CAST:2,CRYS:2,UCOM:2,BILUA:2 in the last 72 hours  MICROBIOLOGY: Recent Results (from the past 240 hour(s))  CLOSTRIDIUM DIFFICILE BY PCR     Status: Abnormal   Collection Time   09/19/11  6:07 PM      Component Value Range Status Comment   C difficile by pcr POSITIVE (*)  NEGATIVE  Final   MRSA PCR SCREENING     Status: Normal   Collection Time   09/19/11  8:13 PM      Component Value Range Status Comment   MRSA by PCR NEGATIVE  NEGATIVE  Final     RADIOLOGY STUDIES/RESULTS: No results found.  MEDICATIONS: Scheduled Meds:    . sodium chloride   Intravenous STAT  . acetaminophen  650 mg Oral Once  . diphenhydrAMINE  25 mg Intravenous Once  . furosemide  20 mg Intravenous Once  . pantoprazole  40 mg Oral Q0600   Continuous Infusions:    . dextrose 5 % and 0.9% NaCl 75 mL/hr at 09/20/11 2034   PRN Meds:.acetaminophen, acetaminophen, hydrALAZINE, ondansetron (ZOFRAN) IV, ondansetron, technetium labeled red blood  cells  Antibiotics: Anti-infectives    None      Assessment/Plan: Patient Active Hospital Problem List: Bright red rectal bleeding  -high suspicion for diverticular bleed -appreciate GI input - bleeding scan 5/24-the bleeding appears to be originating  near the transverse colon and possibly the right hepatic flexure.  -will continue to provide supportive care-hopefully this bleed will resolve spontaneously soon -spoke with Dr Loreta Ave- to start prep for colonoscopy -have asked [facility] to consult  Acute Blood Loss Anemia -secondary to above -currently s/p 2 units PRBC-5/24, will get another 2 units 3/25 -check H/H periodically -transfuse prn  Pseudomembranous colitis  -recent h/o C Diff Colitis in March 2013 -Currently denies any diarrhea to me -per GI-no need to treat for this currently-felt PCR C Diff positive as a sequale of her recent infection and not an acute infection  HTN -BP currently controlled without use of any anti-hypertensives  History of CHF-Diastolic -currently compensated -EF per last Echo-1/13-60-65%  Disposition: Remain inpatient  DVT Prophylaxis: SCD's  Code Status DNR-confirmed this with talking to patient and son at bedside.   Maretta Bees,  MD. 09/21/2011, 11:44 AM

## 2011-09-21 NOTE — Consult Note (Signed)
Reason for Consult:Persistent GI bleeding Referring Physician: Adleigh Mcmasters is an 78 y.o. female.  HPI: This is a 78 year old female with some medical comorbidities who presents with a recurrent GI bleed. She was initially evaluated in April of 2011. She was found to have bleeding from a sigmoid diverticuli. This was controlled with epinephrine and clips. At the time of the colonoscopy she was noted to have diverticula throughout her entire colon. She presents again with blood and clots per rectum. She has required transfusion of 6 units of packed red blood cells. She denies any abdominal pain.  In April 2011 the bleeding was from the left side of the colon. However at this admission part of her workup included a tagged red cell study which revealed bleeding from the hepatic flexure or the proximal transverse colon. The patient also tested positive for clostridium difficile. This has been present since March and has been treated but her tests continue to read positive. She denies any abdominal pain.  Past Medical History  Diagnosis Date  . CHF (congestive heart failure)   . Hypertension   . Aneurysm 1972    cerebral  . Acid reflux     occasional  . Tremor     worse on right arm  . History of GI diverticular bleed 06/2009    colonoscopy with endo clipping of bleeding tic by Dr Vida Rigger.  Flex sig by Dr Dulce Sellar.  . Acute blood loss anemia 06/2009    received ~ 6 units blood  . Adenomatous polyp of colon 06/2009    Past Surgical History  Procedure Date  . Hip fracture surgery 2011  . Colonoscopy w/ control of hemorrhage 06/2009  . Knee arthroscopy   . Cerebral aneurysm repair 1970's    History reviewed. No pertinent family history.  Social History:  reports that she has never smoked. She has never used smokeless tobacco. She reports that she does not drink alcohol or use illicit drugs.  Allergies: No Known Allergies  Medications: No current blood  thinners   Results for orders placed during the hospital encounter of 09/19/11 (from the past 48 hour(s))  OCCULT BLOOD, POC DEVICE     Status: Normal   Collection Time   09/19/11  4:06 PM      Component Value Range Comment   Fecal Occult Bld POSITIVE     CLOSTRIDIUM DIFFICILE BY PCR     Status: Abnormal   Collection Time   09/19/11  6:07 PM      Component Value Range Comment   C difficile by pcr POSITIVE (*) NEGATIVE    TYPE AND SCREEN     Status: Normal (Preliminary result)   Collection Time   09/19/11  6:53 PM      Component Value Range Comment   ABO/RH(D) B POS      Antibody Screen NEG      Sample Expiration 09/22/2011      Unit Number [id number]      Blood Component Type RED CELLS,LR      Unit division 00      Status of Unit ISSUED,FINAL      Transfusion Status OK TO TRANSFUSE      Crossmatch Result Compatible      Unit Number [id number]      Blood Component Type RED CELLS,LR      Unit division 00      Status of Unit ISSUED,FINAL      Transfusion Status OK TO TRANSFUSE  Crossmatch Result Compatible      Unit Number [id number]      Blood Component Type RED CELLS,LR      Unit division 00      Status of Unit ISSUED      Transfusion Status OK TO TRANSFUSE      Crossmatch Result Compatible      Unit Number [id number]      Blood Component Type RED CELLS,LR      Unit division 00      Status of Unit ISSUED      Transfusion Status OK TO TRANSFUSE      Crossmatch Result Compatible      Unit Number [id number]      Blood Component Type RED CELLS,LR      Unit division 00      Status of Unit ISSUED      Transfusion Status OK TO TRANSFUSE      Crossmatch Result Compatible     MRSA PCR SCREENING     Status: Normal   Collection Time   09/19/11  8:13 PM      Component Value Range Comment   MRSA by PCR NEGATIVE  NEGATIVE    CBC     Status: Abnormal   Collection Time   09/20/11  3:08 AM      Component Value Range Comment   WBC 9.1  4.0 - 10.5 (K/uL)    RBC 2.12 (*) 3.87 - 5.11  (MIL/uL)    Hemoglobin 6.3 (*) 12.0 - 15.0 (g/dL)    HCT 08.6 (*) 57.8 - 46.0 (%)    MCV 88.7  78.0 - 100.0 (fL)    MCH 29.7  26.0 - 34.0 (pg)    MCHC 33.5  30.0 - 36.0 (g/dL)    RDW 46.9  62.9 - 52.8 (%)    Platelets 240  150 - 400 (K/uL)   BASIC METABOLIC PANEL     Status: Abnormal   Collection Time   09/20/11  3:09 AM      Component Value Range Comment   Sodium 142  135 - 145 (mEq/L)    Potassium 3.5  3.5 - 5.1 (mEq/L)    Chloride 111  96 - 112 (mEq/L)    CO2 25  19 - 32 (mEq/L)    Glucose, Bld 100 (*) 70 - 99 (mg/dL)    BUN 17  6 - 23 (mg/dL)    Creatinine, Ser 4.13  0.50 - 1.10 (mg/dL)    Calcium 8.4  8.4 - 10.5 (mg/dL)    GFR calc non Af Amer 73 (*) >90 (mL/min)    GFR calc Af Amer 84 (*) >90 (mL/min)   PREPARE RBC (CROSSMATCH)     Status: Normal   Collection Time   09/20/11  4:00 AM      Component Value Range Comment   Order Confirmation ORDER PROCESSED BY BLOOD BANK     CBC     Status: Abnormal   Collection Time   09/20/11  2:07 PM      Component Value Range Comment   WBC 9.6  4.0 - 10.5 (K/uL)    RBC 3.15 (*) 3.87 - 5.11 (MIL/uL)    Hemoglobin 9.1 (*) 12.0 - 15.0 (g/dL)    HCT 24.4 (*) 01.0 - 46.0 (%)    MCV 86.0  78.0 - 100.0 (fL)    MCH 28.9  26.0 - 34.0 (pg)    MCHC 33.6  30.0 - 36.0 (g/dL)    RDW 27.2 (*) 53.6 -  15.5 (%)    Platelets 238  150 - 400 (K/uL)   GLUCOSE, CAPILLARY     Status: Normal   Collection Time   09/20/11  6:06 PM      Component Value Range Comment   Glucose-Capillary 90  70 - 99 (mg/dL)   HEMOGLOBIN AND HEMATOCRIT, BLOOD     Status: Abnormal   Collection Time   09/20/11  6:16 PM      Component Value Range Comment   Hemoglobin 9.4 (*) 12.0 - 15.0 (g/dL)    HCT 45.4 (*) 09.8 - 46.0 (%)   HEMOGLOBIN AND HEMATOCRIT, BLOOD     Status: Abnormal   Collection Time   09/21/11  2:06 AM      Component Value Range Comment   Hemoglobin 7.9 (*) 12.0 - 15.0 (g/dL)    HCT 11.9 (*) 14.7 - 46.0 (%)   PREPARE RBC (CROSSMATCH)     Status: Normal    Collection Time   09/21/11  3:30 AM      Component Value Range Comment   Order Confirmation ORDER PROCESSED BY BLOOD BANK     PREPARE RBC (CROSSMATCH)     Status: Normal   Collection Time   09/21/11  6:53 AM      Component Value Range Comment   Order Confirmation ORDER PROCESSED BY BLOOD BANK     HEMOGLOBIN AND HEMATOCRIT, BLOOD     Status: Abnormal   Collection Time   09/21/11  9:34 AM      Component Value Range Comment   Hemoglobin 9.2 (*) 12.0 - 15.0 (g/dL)    HCT 82.9 (*) 56.2 - 46.0 (%)   PREPARE RBC (CROSSMATCH)     Status: Normal   Collection Time   09/21/11  1:00 PM      Component Value Range Comment   Order Confirmation ORDER PROCESSED BY BLOOD BANK       Nm Gi Blood Loss  09/20/2011  *RADIOLOGY REPORT*  Clinical Data: History of GI bleeding with hematochezia.  NUCLEAR MEDICINE GASTROINTESTINAL BLEEDING STUDY  Technique:  Sequential abdominal images were obtained following intravenous administration of Tc-10m labeled red blood cells.  Radiopharmaceutical: CURIE ULTRATAG TECHNETIUM TC 80M- LABELED RED BLOOD CELLS IV KIT  Comparison: 08/13/2009  Findings: Radiopharmaceutical was identified in the expected vascular structures.  There is delayed filling of the urinary bladder.  During the second hour of imaging, there is radiopharmaceutical within a bowel structure in the mid abdomen. The radiopharmaceutical appears to move into the left abdomen. Findings are suggestive for bleeding involving the transverse colon.  IMPRESSION: Positive GI bleeding study.  The bleeding appears to be originating near the transverse colon and possibly the right hepatic flexure.  These results were called by telephone on 09/20/2011  at  6:21 p.m. to  Dr. Jerral Ralph, who verbally acknowledged these results.  Original Report Authenticated By: Richarda Overlie, M.D.    Review of Systems  Eyes: Negative.   Gastrointestinal: Positive for diarrhea and blood in stool.  Neurological: Positive for weakness.   Endo/Heme/Allergies: Negative.    Blood pressure 122/68, pulse 85, temperature 98 F (36.7 C), temperature source Oral, resp. rate 18, height 5\' 7"  (1.702 m), weight 147 lb (66.679 kg), SpO2 92.00%. Physical Exam Elderly female in NAD HEENT:  EOMI, sclera anicteric Neck:  No masses, no thyromegaly Lungs:  CTA bilaterally; normal respiratory effort CV:  Regular rate and rhythm; no murmurs Abd:  +bowel sounds, soft, non-tender, protruding umbilical hernia - partially reducible Ext:  Well-perfused; no edema Skin:  Warm, dry; no sign of jaundice  Assessment/Plan: Imp:  Pan-colonic diverticulosis with persistent Gi bleeding - currently this is probably from the hepatic flexure or proximal transverse colon.  The patient has received at least four units of PRBC in the last 12 hours.  She remains hemodynamically stable, so we do not need to rush to the operating room.    Recs:  Follow serial hgb after transfusion.  If patient continues to bleed, we will need to do a total abdominal colectomy with probable end ileostomy.  I would be hesitant to do just a limited right hemicolectomy since she has a history of spontaneous bleeding from the left colon.    I spent some time with the patient and her son discussing these recommendations and the benefits and risks of the procedure.  She did well with her hip surgery in 2011, but she may require prolonged ICU stay, ventilation, nursing home placement after surgery.  Other complications would include persistent bleeding, wound infection, risks of anesthesia.  They would like to wait until tomorrow to see if the bleeding stops on its own.  We will follow along.  Discussed with Dr. Jerral Ralph.  Wilmon Arms. Corliss Skains, MD, [facility]  09/21/2011 3:24 PM   Florenda Watt K. 09/21/2011, 2:33 PM

## 2011-09-21 NOTE — Progress Notes (Signed)
18 Family and patient agreed to colonoscopy.  Relayed information to Dr. Loreta Ave who wants golytely prep to be started now and to watch for any syncope episode with patient.  Instructed by Dr. Loreta Ave to use bedpan. Dr. Loreta Ave spoke with patient's son regarding colonoscopy.  Prep will be started.  Will continue to monitor. Sarah Perkins

## 2011-09-22 ENCOUNTER — Encounter (HOSPITAL_COMMUNITY)
Admission: EM | Disposition: A | Discharge status: Skilled Nursing Facility | Payer: Self-pay | Source: Home / Self Care | Attending: Internal Medicine

## 2011-09-22 ENCOUNTER — Encounter (HOSPITAL_COMMUNITY): Payer: Self-pay | Admitting: Gastroenterology

## 2011-09-22 DIAGNOSIS — R112 Nausea with vomiting, unspecified: Secondary | ICD-10-CM

## 2011-09-22 DIAGNOSIS — K625 Hemorrhage of anus and rectum: Secondary | ICD-10-CM

## 2011-09-22 DIAGNOSIS — K219 Gastro-esophageal reflux disease without esophagitis: Secondary | ICD-10-CM

## 2011-09-22 DIAGNOSIS — I10 Essential (primary) hypertension: Secondary | ICD-10-CM

## 2011-09-22 HISTORY — PX: COLONOSCOPY: SHX5424

## 2011-09-22 LAB — HEMOGLOBIN AND HEMATOCRIT, BLOOD
HCT: 25.4 % — ABNORMAL LOW (ref 36.0–46.0)
HCT: 27.7 % — ABNORMAL LOW (ref 36.0–46.0)
HCT: 27.3 % — ABNORMAL LOW (ref 36.0–46.0)
Hemoglobin: 8.7 g/dL — ABNORMAL LOW (ref 12.0–15.0)
Hemoglobin: 9.6 g/dL — ABNORMAL LOW (ref 12.0–15.0)

## 2011-09-22 LAB — CBC
HCT: 25.3 % — ABNORMAL LOW (ref 36.0–46.0)
Hemoglobin: 8.7 g/dL — ABNORMAL LOW (ref 12.0–15.0)
MCHC: 34.4 g/dL (ref 30.0–36.0)
MCV: 82.7 fL (ref 78.0–100.0)
RDW: 16.7 % — ABNORMAL HIGH (ref 11.5–15.5)

## 2011-09-22 LAB — BASIC METABOLIC PANEL
BUN: 18 mg/dL (ref 6–23)
Creatinine, Ser: 0.83 mg/dL (ref 0.50–1.10)
GFR calc Af Amer: 69 mL/min — ABNORMAL LOW (ref >90)
GFR calc non Af Amer: 60 mL/min — ABNORMAL LOW (ref >90)
Glucose, Bld: 106 mg/dL — ABNORMAL HIGH (ref 70–99)

## 2011-09-22 SURGERY — COLONOSCOPY
Anesthesia: Moderate Sedation

## 2011-09-22 MED ORDER — MIDAZOLAM HCL 10 MG/2ML IJ SOLN
INTRAMUSCULAR | Status: AC
  Filled 2011-09-22: qty 4

## 2011-09-22 MED ORDER — MIDAZOLAM HCL 5 MG/5ML IJ SOLN
INTRAMUSCULAR | Status: DC | PRN
  Administered 2011-09-22: 1 mg via INTRAVENOUS
  Administered 2011-09-22: 2 mg via INTRAVENOUS

## 2011-09-22 MED ORDER — FENTANYL NICU IV SYRINGE 50 MCG/ML
INJECTION | INTRAMUSCULAR | Status: DC | PRN
  Administered 2011-09-22 (×2): 25 ug via INTRAVENOUS

## 2011-09-22 MED ORDER — FENTANYL CITRATE 0.05 MG/ML IJ SOLN
INTRAMUSCULAR | Status: AC
  Filled 2011-09-22: qty 4

## 2011-09-22 MED ORDER — DIPHENHYDRAMINE HCL 50 MG/ML IJ SOLN
INTRAMUSCULAR | Status: AC
  Filled 2011-09-22: qty 1

## 2011-09-22 NOTE — Progress Notes (Signed)
  Subjective: Had 3 bloody BMs overnight.  Objective: Vital signs in last 24 hours: Temp:  [97.3 F (36.3 C)-98.7 F (37.1 C)] 98.3 F (36.8 C) (05/26 0715) Pulse Rate:  [85-106] 98  (05/26 0715) Resp:  [18-20] 18  (05/26 0715) BP: (117-144)/(61-78) 125/74 mmHg (05/26 0715) SpO2:  [94 %] 94 % (05/26 0541) Last BM Date: 09/21/11  Intake/Output from previous day: 05/25 0701 - 05/26 0700 In: 3827.1 [P.O.:3000; Blood:827.1] Out: -  Intake/Output this shift:    PE: Abd-soft, nontender,  Lab Results:   Basename 09/22/11 0311 09/22/11 0300 09/21/11 1843  WBC 12.7* -- 11.6*  HGB 8.7* 8.7* --  HCT 25.3* 25.4* --  PLT 185 -- 208   BMET  Basename 09/22/11 0311 09/20/11 0309  NA 138 142  K 3.3* 3.5  CL 107 111  CO2 24 25  GLUCOSE 106* 100*  BUN 18 17  CREATININE 0.83 0.72  CALCIUM 7.8* 8.4   PT/INR  Basename 09/19/11 1423  LABPROT 14.0  INR 1.06   Comprehensive Metabolic Panel:    Component Value Date/Time   NA 138 09/22/2011 0311   K 3.3* 09/22/2011 0311   CL 107 09/22/2011 0311   CO2 24 09/22/2011 0311   BUN 18 09/22/2011 0311   CREATININE 0.83 09/22/2011 0311   CREATININE 0.73 01/26/2009 1310   GLUCOSE 106* 09/22/2011 0311   CALCIUM 7.8* 09/22/2011 0311   AST 15 09/19/2011 1423   ALT 6 09/19/2011 1423   ALKPHOS 47 09/19/2011 1423   BILITOT 0.2* 09/19/2011 1423   PROT 8.2 09/19/2011 1423   ALBUMIN 2.6* 09/19/2011 1423     Studies/Results: Nm Gi Blood Loss  09/20/2011  *RADIOLOGY REPORT*  Clinical Data: History of GI bleeding with hematochezia.  NUCLEAR MEDICINE GASTROINTESTINAL BLEEDING STUDY  Technique:  Sequential abdominal images were obtained following intravenous administration of Tc-65m labeled red blood cells.  Radiopharmaceutical: CURIE ULTRATAG TECHNETIUM TC 50M- LABELED RED BLOOD CELLS IV KIT  Comparison: 08/13/2009  Findings: Radiopharmaceutical was identified in the expected vascular structures.  There is delayed filling of the urinary bladder.   During the second hour of imaging, there is radiopharmaceutical within a bowel structure in the mid abdomen. The radiopharmaceutical appears to move into the left abdomen. Findings are suggestive for bleeding involving the transverse colon.  IMPRESSION: Positive GI bleeding study.  The bleeding appears to be originating near the transverse colon and possibly the right hepatic flexure.  These results were called by telephone on 09/20/2011  at  6:21 p.m. to  Dr. Jerral Ralph, who verbally acknowledged these results.  Original Report Authenticated By: Richarda Overlie, M.D.    Anti-infectives: Anti-infectives    None      Assessment Principal Problem:  Acute lower GI bleed-colon is the source; bleeding scan results noted. Active Problems:  Pseudomembranous colitis  Acute posthemorrhagic anemia    LOS: 3 days   Plan: Colonoscopy this AM.  If bleeding cannot be stopped by endoscopic interventions, would need abdominal colectomy and ileostomy.  She and her son are aware of this.   Adolph Pollack 09/22/2011

## 2011-09-22 NOTE — Progress Notes (Signed)
PATIENT DETAILS Name: Sarah Perkins Age: 78 y.o. Sex: female Date of Birth: [birth year] Admit Date: 09/19/2011 ZOX:[id number],WJXBJ, MD, MD  Subjective: Continues to have hematochezia-with numerous episodes throughout the night  Objective: Vital signs in last 24 hours: Filed Vitals:   09/22/11 0600 09/22/11 0615 09/22/11 0715 09/22/11 0913  BP: 117/64 124/61 125/74 149/67  Pulse: 100 99 98 99  Temp: 98.7 F (37.1 C) 97.7 F (36.5 C) 98.3 F (36.8 C) 97.2 F (36.2 C)  TempSrc: Oral Oral Oral Oral  Resp: 20 20 18 16   Height:      Weight:      SpO2:    94%    Weight change:   Body mass index is 23.02 kg/(m^2).  Intake/Output from previous day:  Intake/Output Summary (Last 24 hours) at 09/22/11 1007 Last data filed at 09/22/11 0600  Gross per 24 hour  Intake 3827.08 ml  Output      0 ml  Net 3827.08 ml    PHYSICAL EXAM: Gen Exam: Awake and alert with clear speech.   Neck: Supple, No JVD.   Chest: B/L Clear.  No rales or rhonchi CVS: S1 S2 Regular, no murmurs.  Abdomen: soft, BS +, non tender, non distended.  Extremities: no edema, lower extremities warm to touch. Neurologic: Non Focal.   Skin: No Rash.   Wounds: N/A.    CONSULTS:  GI  LAB RESULTS: CBC  Lab 09/22/11 0311 09/22/11 0300 09/21/11 1843 09/21/11 0934 09/21/11 0206 09/20/11 1407 09/20/11 0308 09/19/11 1423  WBC 12.7* -- 11.6* -- -- 9.6 9.1 10.6*  HGB 8.7* 8.7* 10.2* 9.2* 7.9* -- -- --  HCT 25.3* 25.4* 29.7* 26.3* 23.1* -- -- --  PLT 185 -- 208 -- -- 238 240 329  MCV 82.7 -- 83.4 -- -- 86.0 88.7 90.6  MCH 28.4 -- 28.7 -- -- 28.9 29.7 29.6  MCHC 34.4 -- 34.3 -- -- 33.6 33.5 32.6  RDW 16.7* -- 16.7* -- -- 16.2* 15.1 14.9  LYMPHSABS -- -- -- -- -- -- -- 2.5  MONOABS -- -- -- -- -- -- -- 0.9  EOSABS -- -- -- -- -- -- -- 0.8*  BASOSABS -- -- -- -- -- -- -- 0.1  BANDABS -- -- -- -- -- -- -- --    Chemistries   Lab 09/22/11 0311 09/20/11 0309 09/19/11 1423  NA 138 142 140  K 3.3* 3.5  3.9  CL 107 111 105  CO2 24 25 26   GLUCOSE 106* 100* 137*  BUN 18 17 17   CREATININE 0.83 0.72 0.73  CALCIUM 7.8* 8.4 9.5  MG -- -- --    GFR Estimated Creatinine Clearance: 42.9 ml/min (by C-G formula based on Cr of 0.83).  Coagulation profile  Lab 09/19/11 1423  INR 1.06  PROTIME --    Cardiac Enzymes No results found for this basename: CK:3,CKMB:3,TROPONINI:3,MYOGLOBIN:3 in the last 168 hours  No components found with this basename: POCBNP:3 No results found for this basename: DDIMER:2 in the last 72 hours No results found for this basename: HGBA1C:2 in the last 72 hours No results found for this basename: CHOL:2,HDL:2,LDLCALC:2,TRIG:2,CHOLHDL:2,LDLDIRECT:2 in the last 72 hours No results found for this basename: TSH,T4TOTAL,FREET3,T3FREE,THYROIDAB in the last 72 hours No results found for this basename: VITAMINB12:2,FOLATE:2,FERRITIN:2,TIBC:2,IRON:2,RETICCTPCT:2 in the last 72 hours No results found for this basename: LIPASE:2,AMYLASE:2 in the last 72 hours  Urine Studies No results found for this basename: UACOL:2,UAPR:2,USPG:2,UPH:2,UTP:2,UGL:2,UKET:2,UBIL:2,UHGB:2,UNIT:2,UROB:2,ULEU:2,UEPI:2,UWBC:2,URBC:2,UBAC:2,CAST:2,CRYS:2,UCOM:2,BILUA:2 in the last 72 hours  MICROBIOLOGY: Recent Results (from the past 240 hour(s))  CLOSTRIDIUM DIFFICILE BY PCR     Status: Abnormal   Collection Time   09/19/11  6:07 PM      Component Value Range Status Comment   C difficile by pcr POSITIVE (*) NEGATIVE  Final   MRSA PCR SCREENING     Status: Normal   Collection Time   09/19/11  8:13 PM      Component Value Range Status Comment   MRSA by PCR NEGATIVE  NEGATIVE  Final     RADIOLOGY STUDIES/RESULTS: No results found.  MEDICATIONS: Scheduled Meds:    . acetaminophen  650 mg Oral Once  . diphenhydrAMINE  25 mg Intravenous Once  . furosemide  20 mg Intravenous Once  . furosemide  20 mg Intravenous Once  . pantoprazole  40 mg Oral Q0600  . polyethylene glycol-electrolytes   4,000 mL Oral Once  . DISCONTD: polyethylene glycol-electrolytes  4,000 mL Oral Once   Continuous Infusions:   PRN Meds:.acetaminophen, acetaminophen, hydrALAZINE, ondansetron (ZOFRAN) IV, ondansetron  Antibiotics: Anti-infectives    None      Assessment/Plan: Patient Active Hospital Problem List: Bright red rectal bleeding  -high suspicion for diverticular bleed - bleeding scan 5/24-the bleeding appears to be originating near the transverse colon and possibly the right hepatic flexure.  -will continue to provide supportive care-hopefully this bleed will resolve spontaneously soon - for colonoscopy this -appreciate CCS  Consult-may need colectomy if bleeding persists  Acute Blood Loss Anemia -secondary to above -so far has received 5 units of PRBC -check H/H periodically -transfuse prn  Pseudomembranous colitis  -recent h/o C Diff Colitis in March 2013 -Currently denies any diarrhea to me -per GI-no need to treat for this currently-felt PCR C Diff positive as a sequale of her recent infection and not an acute infection  HTN -BP currently controlled without use of any anti-hypertensives  History of CHF-Diastolic -currently compensated -EF per last Echo-1/13-60-65%  Disposition: Remain inpatient  DVT Prophylaxis: SCD's  Code Status DNR-confirmed this with talking to patient and son at bedside.   Maretta Bees,  MD. 09/22/2011, 10:07 AM

## 2011-09-22 NOTE — Op Note (Signed)
Eligha Bridegroom [facility] [address] [place], [place]  [address]  OPERATIVE PROCEDURE REPORT  PATIENT:  Sarah Perkins, Sarah Perkins  MR#:  [id number] BIRTHDATE:  12/12/1917  GENDER:  female ENDOSCOPIST:  Dr. Lorenza Burton, MD ASSISTANT:  Claudie Revering, RN CGRN & Kizzie Bane, technician.  PROCEDURE DATE:  09/22/2011 PRE-PROCEDURE PREPERATION:  The patient was prepped with a gallon of Nuly[place] the night prior to the procedure. The patient was fasted for 8 hours prior to the procedure. PRE-PROCEDURE PHYSICAL:  Patient has stable vital signs. Neck is supple. There is no JVD, thyromegaly or LAD. Chest clear to auscultation. S1 and S2 regular. Abdomen soft, non-distended, non-tender with NABS. PROCEDURE:  Diagnostic Colonoscopy ASA CLASS:  Class IV INDICATIONS:  1) Rectal bleeding 2) Iron deficiency anemia 3) Personal history of colonic polyps 4) CRC screening. MEDICATIONS:  Fentanyl 50 mcg & Versed 3 mg IV.  DESCRIPTION OF PROCEDURE: After the risks, benefits, and alternatives of the procedure were thoroughly explained [including a 10% missed rate of cancer and polyps], informed consent was obtained.  Digital rectal exam was performed. The Pentax pediatric colonoscope [id number] was introduced through the anus and advanced to the cecum, which was identified by both the appendix and ileocecal valve, limited by a redundant colon. The quality of the prep was fair, at best. Multiple washes were done. Small lesions could be missed. The instrument was then slowly withdrawn as the colon was fully examined. <<PROCEDUREIMAGES>>  FINDINGS:  There was evidence of extensive pandiverticulosis with inspissated stool in several of the diverticula. The rest of the colonic mucosa appeared healthy with a normal vascular pattern. No masses, polyps or AVM's were noted.  The appendiceal orifice and the ICV were identified and photographed. Retroflexed views revealed no abnormalities.  The patient tolerated the procedure without immediate complications.  The scope was then withdrawn from the patient and the procedure terminated.  IMPRESSION:  Extensive paniverticulosis; otherwise normal colonoscopy up to the cecum.  RECOMMENDATIONS:  1) Avoid all NSAIDS for now. 2) Start clear liquid diet. 3) Monitor serial CBC's.  REPEAT EXAM:  None planned for now.  DISCHARGE INSTRUCTIONS:  Standard discharge instructions given.  ______________________________ Dr. Lorenza Burton, MD  CPT CODES: 19147  DIAGNOSIS CODES:  569.3, 280.9, V12.72, V76.51  CC:  Barbette Hair. Arlyce Dice, M.D.  n. eSIGNED:   Dr. Lorenza Burton at 09/22/2011 10:56 AM  Madalyn Rob, [phone]

## 2011-09-23 DIAGNOSIS — I10 Essential (primary) hypertension: Secondary | ICD-10-CM

## 2011-09-23 DIAGNOSIS — K625 Hemorrhage of anus and rectum: Secondary | ICD-10-CM

## 2011-09-23 DIAGNOSIS — R112 Nausea with vomiting, unspecified: Secondary | ICD-10-CM

## 2011-09-23 DIAGNOSIS — K219 Gastro-esophageal reflux disease without esophagitis: Secondary | ICD-10-CM

## 2011-09-23 LAB — TYPE AND SCREEN
Unit division: 0
Unit division: 0
Unit division: 0
Unit division: 0
Unit division: 0

## 2011-09-23 LAB — HEMOGLOBIN AND HEMATOCRIT, BLOOD
HCT: 26.4 % — ABNORMAL LOW (ref 36.0–46.0)
Hemoglobin: 9.1 g/dL — ABNORMAL LOW (ref 12.0–15.0)

## 2011-09-23 NOTE — Progress Notes (Signed)
1 Day Post-Op  Subjective: No abdominal pain.  No bloody BMs.  Objective: Vital signs in last 24 hours: Temp:  [97.1 F (36.2 C)-98.3 F (36.8 C)] 98.3 F (36.8 C) (05/27 0717) Pulse Rate:  [93-134] 97  (05/27 0717) Resp:  [15-22] 18  (05/27 0717) BP: (124-192)/(61-100) 133/66 mmHg (05/27 0717) SpO2:  [94 %-100 %] 97 % (05/27 0717) Last BM Date: 09/22/11  Intake/Output from previous day: 05/26 0701 - 05/27 0700 In: 383.3 [Blood:383.3] Out: -  Intake/Output this shift:    PE: Abd-soft, nontender,  Lab Results:   Basename 09/23/11 0319 09/22/11 1928 09/22/11 0311 09/21/11 1843  WBC -- -- 12.7* 11.6*  HGB 9.1* 9.4* -- --  HCT 26.4* 27.3* -- --  PLT -- -- 185 208   BMET  Basename 09/22/11 0311  NA 138  K 3.3*  CL 107  CO2 24  GLUCOSE 106*  BUN 18  CREATININE 0.83  CALCIUM 7.8*   PT/INR No results found for this basename: LABPROT:2,INR:2 in the last 72 hours Comprehensive Metabolic Panel:    Component Value Date/Time   NA 138 09/22/2011 0311   K 3.3* 09/22/2011 0311   CL 107 09/22/2011 0311   CO2 24 09/22/2011 0311   BUN 18 09/22/2011 0311   CREATININE 0.83 09/22/2011 0311   CREATININE 0.73 01/26/2009 1310   GLUCOSE 106* 09/22/2011 0311   CALCIUM 7.8* 09/22/2011 0311   AST 15 09/19/2011 1423   ALT 6 09/19/2011 1423   ALKPHOS 47 09/19/2011 1423   BILITOT 0.2* 09/19/2011 1423   PROT 8.2 09/19/2011 1423   ALBUMIN 2.6* 09/19/2011 1423     Studies/Results: No results found.  Anti-infectives: Anti-infectives    None      Assessment Principal Problem:  Acute lower GI bleed-colon is the source; no active bleeding site seen on colonoscopy yesterday; not bleeding currently Active Problems:   Hx of Pseudomembranous colitis  Acute posthemorrhagic anemia    LOS: 4 days   Plan:  Colectomy and ileostomy if she rebleeds.   Adolph Pollack 09/23/2011

## 2011-09-23 NOTE — Progress Notes (Signed)
PATIENT DETAILS Name: Sarah Perkins Age: 78 y.o. Sex: female Date of Birth: 1917/07/29 Admit Date: 09/19/2011 YNW:[id number],MVHQI, MD, MD  Subjective: No hematochezia for past 24 hours  Objective: Vital signs in last 24 hours: Filed Vitals:   09/22/11 1120 09/22/11 1130 09/22/11 1500 09/23/11 0717  BP: 128/66 126/66 127/71 133/66  Pulse:   99 97  Temp:   97.1 F (36.2 C) 98.3 F (36.8 C)  TempSrc:   Oral Oral  Resp: 22 18 18 18   Height:      Weight:      SpO2: 96% 96% 98% 97%    Weight change:   Body mass index is 23.02 kg/(m^2).  Intake/Output from previous day: No intake or output data in the 24 hours ending 09/23/11 1311  PHYSICAL EXAM: Gen Exam: Awake and alert with clear speech.   Neck: Supple, No JVD.   Chest: B/L Clear.  No rales or rhonchi CVS: S1 S2 Regular, no murmurs.  Abdomen: soft, BS +, non tender, non distended.  Extremities: no edema, lower extremities warm to touch. Neurologic: Non Focal.   Skin: No Rash.   Wounds: N/A.    CONSULTS:  GI  LAB RESULTS: CBC  Lab 09/23/11 1038 09/23/11 0319 09/22/11 1928 09/22/11 1445 09/22/11 0311 09/21/11 1843 09/20/11 1407 09/20/11 0308 09/19/11 1423  WBC -- -- -- -- 12.7* 11.6* 9.6 9.1 10.6*  HGB 8.5* 9.1* 9.4* 9.6* 8.7* -- -- -- --  HCT 25.1* 26.4* 27.3* 27.7* 25.3* -- -- -- --  PLT -- -- -- -- 185 208 238 240 329  MCV -- -- -- -- 82.7 83.4 86.0 88.7 90.6  MCH -- -- -- -- 28.4 28.7 28.9 29.7 29.6  MCHC -- -- -- -- 34.4 34.3 33.6 33.5 32.6  RDW -- -- -- -- 16.7* 16.7* 16.2* 15.1 14.9  LYMPHSABS -- -- -- -- -- -- -- -- 2.5  MONOABS -- -- -- -- -- -- -- -- 0.9  EOSABS -- -- -- -- -- -- -- -- 0.8*  BASOSABS -- -- -- -- -- -- -- -- 0.1  BANDABS -- -- -- -- -- -- -- -- --    Chemistries   Lab 09/22/11 0311 09/20/11 0309 09/19/11 1423  NA 138 142 140  K 3.3* 3.5 3.9  CL 107 111 105  CO2 24 25 26   GLUCOSE 106* 100* 137*  BUN 18 17 17   CREATININE 0.83 0.72 0.73  CALCIUM 7.8* 8.4 9.5  MG --  -- --    GFR Estimated Creatinine Clearance: 42.9 ml/min (by C-G formula based on Cr of 0.83).  Coagulation profile  Lab 09/19/11 1423  INR 1.06  PROTIME --    Cardiac Enzymes No results found for this basename: CK:3,CKMB:3,TROPONINI:3,MYOGLOBIN:3 in the last 168 hours  No components found with this basename: POCBNP:3 No results found for this basename: DDIMER:2 in the last 72 hours No results found for this basename: HGBA1C:2 in the last 72 hours No results found for this basename: CHOL:2,HDL:2,LDLCALC:2,TRIG:2,CHOLHDL:2,LDLDIRECT:2 in the last 72 hours No results found for this basename: TSH,T4TOTAL,FREET3,T3FREE,THYROIDAB in the last 72 hours No results found for this basename: VITAMINB12:2,FOLATE:2,FERRITIN:2,TIBC:2,IRON:2,RETICCTPCT:2 in the last 72 hours No results found for this basename: LIPASE:2,AMYLASE:2 in the last 72 hours  Urine Studies No results found for this basename: UACOL:2,UAPR:2,USPG:2,UPH:2,UTP:2,UGL:2,UKET:2,UBIL:2,UHGB:2,UNIT:2,UROB:2,ULEU:2,UEPI:2,UWBC:2,URBC:2,UBAC:2,CAST:2,CRYS:2,UCOM:2,BILUA:2 in the last 72 hours  MICROBIOLOGY: Recent Results (from the past 240 hour(s))  CLOSTRIDIUM DIFFICILE BY PCR     Status: Abnormal   Collection Time   09/19/11  6:07 PM  Component Value Range Status Comment   C difficile by pcr POSITIVE (*) NEGATIVE  Final   MRSA PCR SCREENING     Status: Normal   Collection Time   09/19/11  8:13 PM      Component Value Range Status Comment   MRSA by PCR NEGATIVE  NEGATIVE  Final     RADIOLOGY STUDIES/RESULTS: No results found.  MEDICATIONS: Scheduled Meds:    . acetaminophen  650 mg Oral Once  . diphenhydrAMINE  25 mg Intravenous Once  . furosemide  20 mg Intravenous Once  . pantoprazole  40 mg Oral Q0600   Continuous Infusions:   PRN Meds:.acetaminophen, acetaminophen, hydrALAZINE, ondansetron (ZOFRAN) IV, ondansetron  Antibiotics: Anti-infectives    None      Assessment/Plan: Patient Active Hospital  Problem List: Bright red rectal bleeding  -high suspicion for diverticular bleed - bleeding scan 5/24-the bleeding appears to be originating near the transverse colon and possibly the right hepatic flexure.  - Colonoscopy n 5/26-showe pan-diverticulosis-but no active bleed -appreciate CCS  eval-if rebleeds-then will need colectomy-however for now seems to have resolved  Acute Blood Loss Anemia -secondary to above -so far has received 5 units of PRBC so far -check H/H periodically-however Hb seems to be steady for how in high 8's to low 9's range -transfuse prn  Pseudomembranous colitis  -recent h/o C Diff Colitis in March 2013 -Currently denies any diarrhea to me -per GI-no need to treat for this currently-felt PCR C Diff positive as a sequale of her recent infection and not an acute infection  HTN -BP currently controlled without use of any anti-hypertensives  History of CHF-Diastolic -currently compensated -EF per last Echo-1/13-60-65%  Disposition: Remain inpatient-if no further bleeding-d/c in 1-2 days  DVT Prophylaxis: SCD's  Code Status DNR-confirmed this with talking to patient and son at bedside.   Maretta Bees,  MD. 09/23/2011, 1:11 PM

## 2011-09-24 ENCOUNTER — Encounter (HOSPITAL_COMMUNITY): Payer: Self-pay | Admitting: Gastroenterology

## 2011-09-24 DIAGNOSIS — I10 Essential (primary) hypertension: Secondary | ICD-10-CM

## 2011-09-24 DIAGNOSIS — R112 Nausea with vomiting, unspecified: Secondary | ICD-10-CM

## 2011-09-24 DIAGNOSIS — K219 Gastro-esophageal reflux disease without esophagitis: Secondary | ICD-10-CM

## 2011-09-24 DIAGNOSIS — K625 Hemorrhage of anus and rectum: Secondary | ICD-10-CM

## 2011-09-24 LAB — CBC
MCH: 29.1 pg (ref 26.0–34.0)
MCHC: 33.2 g/dL (ref 30.0–36.0)
MCV: 87.5 fL (ref 78.0–100.0)
Platelets: 234 10*3/uL (ref 150–400)
RBC: 2.96 MIL/uL — ABNORMAL LOW (ref 3.87–5.11)

## 2011-09-24 NOTE — Progress Notes (Signed)
PATIENT DETAILS Name: POLINA BURMASTER Age: 78 y.o. Sex: female Date of Birth: October 02, 1917 Admit Date: 09/19/2011 WUJ:[id number],NFAOZ, MD, MD  Subjective: No hematochezia for past 48 hours  Objective: Vital signs in last 24 hours: Filed Vitals:   09/23/11 1404 09/23/11 2208 09/24/11 0500 09/24/11 1300  BP: 116/62 130/65 121/63 120/70  Pulse: 97 98 92 80  Temp: 98.2 F (36.8 C) 98.1 F (36.7 C) 98.2 F (36.8 C) 98.4 F (36.9 C)  TempSrc:  Oral Oral Oral  Resp: 18 18 20 18   Height:      Weight:      SpO2: 96% 91% 95% 94%    Weight change:   Body mass index is 23.02 kg/(m^2).  Intake/Output from previous day:  Intake/Output Summary (Last 24 hours) at 09/24/11 1540 Last data filed at 09/24/11 1300  Gross per 24 hour  Intake    840 ml  Output      0 ml  Net    840 ml    PHYSICAL EXAM: Gen Exam: Awake and alert with clear speech.   Neck: Supple, No JVD.   Chest: B/L Clear.  No rales or rhonchi CVS: S1 S2 Regular, no murmurs.  Abdomen: soft, BS +, non tender, non distended.  Extremities: no edema, lower extremities warm to touch. Neurologic: Non Focal.   Skin: No Rash.   Wounds: N/A.    CONSULTS:  GI  LAB RESULTS: CBC  Lab 09/24/11 0847 09/23/11 1038 09/23/11 0319 09/22/11 1928 09/22/11 1445 09/22/11 0311 09/21/11 1843 09/20/11 1407 09/20/11 0308 09/19/11 1423  WBC 8.3 -- -- -- -- 12.7* 11.6* 9.6 9.1 --  HGB 8.6* 8.5* 9.1* 9.4* 9.6* -- -- -- -- --  HCT 25.9* 25.1* 26.4* 27.3* 27.7* -- -- -- -- --  PLT 234 -- -- -- -- 185 208 238 240 --  MCV 87.5 -- -- -- -- 82.7 83.4 86.0 88.7 --  MCH 29.1 -- -- -- -- 28.4 28.7 28.9 29.7 --  MCHC 33.2 -- -- -- -- 34.4 34.3 33.6 33.5 --  RDW 17.0* -- -- -- -- 16.7* 16.7* 16.2* 15.1 --  LYMPHSABS -- -- -- -- -- -- -- -- -- 2.5  MONOABS -- -- -- -- -- -- -- -- -- 0.9  EOSABS -- -- -- -- -- -- -- -- -- 0.8*  BASOSABS -- -- -- -- -- -- -- -- -- 0.1  BANDABS -- -- -- -- -- -- -- -- -- --    Chemistries   Lab  09/22/11 0311 09/20/11 0309 09/19/11 1423  NA 138 142 140  K 3.3* 3.5 3.9  CL 107 111 105  CO2 24 25 26   GLUCOSE 106* 100* 137*  BUN 18 17 17   CREATININE 0.83 0.72 0.73  CALCIUM 7.8* 8.4 9.5  MG -- -- --    GFR Estimated Creatinine Clearance: 42.9 ml/min (by C-G formula based on Cr of 0.83).  Coagulation profile  Lab 09/19/11 1423  INR 1.06  PROTIME --    Cardiac Enzymes No results found for this basename: CK:3,CKMB:3,TROPONINI:3,MYOGLOBIN:3 in the last 168 hours  No components found with this basename: POCBNP:3 No results found for this basename: DDIMER:2 in the last 72 hours No results found for this basename: HGBA1C:2 in the last 72 hours No results found for this basename: CHOL:2,HDL:2,LDLCALC:2,TRIG:2,CHOLHDL:2,LDLDIRECT:2 in the last 72 hours No results found for this basename: TSH,T4TOTAL,FREET3,T3FREE,THYROIDAB in the last 72 hours No results found for this basename: VITAMINB12:2,FOLATE:2,FERRITIN:2,TIBC:2,IRON:2,RETICCTPCT:2 in the last 72 hours No results found  for this basename: LIPASE:2,AMYLASE:2 in the last 72 hours  Urine Studies No results found for this basename: UACOL:2,UAPR:2,USPG:2,UPH:2,UTP:2,UGL:2,UKET:2,UBIL:2,UHGB:2,UNIT:2,UROB:2,ULEU:2,UEPI:2,UWBC:2,URBC:2,UBAC:2,CAST:2,CRYS:2,UCOM:2,BILUA:2 in the last 72 hours  MICROBIOLOGY: Recent Results (from the past 240 hour(s))  CLOSTRIDIUM DIFFICILE BY PCR     Status: Abnormal   Collection Time   09/19/11  6:07 PM      Component Value Range Status Comment   C difficile by pcr POSITIVE (*) NEGATIVE  Final   MRSA PCR SCREENING     Status: Normal   Collection Time   09/19/11  8:13 PM      Component Value Range Status Comment   MRSA by PCR NEGATIVE  NEGATIVE  Final     RADIOLOGY STUDIES/RESULTS: No results found.  MEDICATIONS: Scheduled Meds:    . acetaminophen  650 mg Oral Once  . diphenhydrAMINE  25 mg Intravenous Once  . furosemide  20 mg Intravenous Once  . pantoprazole  40 mg Oral Q0600    Continuous Infusions:   PRN Meds:.acetaminophen, acetaminophen, hydrALAZINE, ondansetron (ZOFRAN) IV, ondansetron  Antibiotics: Anti-infectives    None    Brief Summary -Patient is a 78 year old Caucasian female with past medical history of a prior diverticular bleed, hypertension who was admitted to the hospital on 09/19/2011 with hematochezia. Given her prior history it was thought that she had diverticular bleed. She also had evidence of acute blood loss anemia. She has had so far 5 units of PRBC transfused to a hospital stay. She was seen by both gastroenterology and general surgery as her diverticular bleed was persistent. She finally underwent a colonoscopy on 5/26 2013 which showed pan diverticulosis with no evidence of bleeding. Fortunately her bleeding seems to have resolved and she has not bled for the past 2 days. Surgery was consulted in anticipation of a colectomy in case her bleeding was found to be persistent. Current plans are to continue to monitor her for another day or so to see if her bleeding would reoccur before discharging home.   Assessment/Plan: Patient Active Hospital Problem List: Bright red rectal bleeding  -high suspicion for diverticular bleed - bleeding scan 5/24-the bleeding appears to be originating near the transverse colon and possibly the right hepatic flexure.  - Colonoscopy n 5/26-showe pan-diverticulosis-but no active bleed -appreciate CCS  eval-if rebleeds-then will need colectomy-however for now seems to have resolved  Acute Blood Loss Anemia -secondary to above -so far has received 5 units of PRBC so far -check H/H periodically-however Hb seems to be steady for how in high 8's to low 9's range -transfuse prn  Pseudomembranous colitis  -recent h/o C Diff Colitis in March 2013 -Currently denies any diarrhea to me -per GI-no need to treat for this currently-felt PCR C Diff positive as a sequale of her recent infection and not an acute  infection  HTN -BP currently controlled without use of any anti-hypertensives  History of CHF-Diastolic -currently compensated -EF per last Echo-1/13-60-65%  Disposition: Remain inpatient-if no further bleeding-d/c in 1-2 days  DVT Prophylaxis: SCD's  Code Status DNR-confirmed this with talking to patient and son at bedside.   Maretta Bees,  MD. 09/24/2011, 3:40 PM

## 2011-09-24 NOTE — Progress Notes (Signed)
Patient ID: KYRIAKI MODER, female   DOB: 1917/12/12, 78 y.o.   MRN: [id number] 2 Days Post-Op  Subjective: Pt feels ok.  Tolerating full liquids.  No bloody BM per pt in 2 days.  Objective: Vital signs in last 24 hours: Temp:  [98.1 F (36.7 C)-98.2 F (36.8 C)] 98.2 F (36.8 C) (05/28 0500) Pulse Rate:  [92-98] 92  (05/28 0500) Resp:  [18-20] 20  (05/28 0500) BP: (116-130)/(62-65) 121/63 mmHg (05/28 0500) SpO2:  [91 %-96 %] 95 % (05/28 0500) Last BM Date: 09/23/11  Intake/Output from previous day: 05/27 0701 - 05/28 0700 In: 1200 [P.O.:1200] Out: -  Intake/Output this shift:    PE: Abd: soft, NT, ND, +BS  Lab Results:   Basename 09/23/11 1038 09/23/11 0319 09/22/11 0311 09/21/11 1843  WBC -- -- 12.7* 11.6*  HGB 8.5* 9.1* -- --  HCT 25.1* 26.4* -- --  PLT -- -- 185 208   BMET  Basename 09/22/11 0311  NA 138  K 3.3*  CL 107  CO2 24  GLUCOSE 106*  BUN 18  CREATININE 0.83  CALCIUM 7.8*   PT/INR No results found for this basename: LABPROT:2,INR:2 in the last 72 hours CMP     Component Value Date/Time   NA 138 09/22/2011 0311   K 3.3* 09/22/2011 0311   CL 107 09/22/2011 0311   CO2 24 09/22/2011 0311   GLUCOSE 106* 09/22/2011 0311   BUN 18 09/22/2011 0311   CREATININE 0.83 09/22/2011 0311   CREATININE 0.73 01/26/2009 1310   CALCIUM 7.8* 09/22/2011 0311   PROT 8.2 09/19/2011 1423   ALBUMIN 2.6* 09/19/2011 1423   AST 15 09/19/2011 1423   ALT 6 09/19/2011 1423   ALKPHOS 47 09/19/2011 1423   BILITOT 0.2* 09/19/2011 1423   GFRNONAA 60* 09/22/2011 0311   GFRAA 69* 09/22/2011 0311   Lipase  No results found for this basename: lipase       Studies/Results: No results found.  Anti-infectives: Anti-infectives    None       Assessment/Plan  1. LGI bleed  Plan: 1. If patient rebleeds and it didn't stop conservatively, she would need a subtotal colectomy given multiple bleeding sites in the last year.  Currently seems stable.  Will follow.   LOS: 5 days      Javonte Elenes E 09/24/2011

## 2011-09-24 NOTE — Evaluation (Signed)
Physical Therapy Evaluation Patient Details Name: Sarah Perkins MRN: [id number] DOB: 1918/01/07 Today's Date: 09/24/2011 Time: 1308-6578 PT Time Calculation (min): 31 min  PT Assessment / Plan / Recommendation Clinical Impression  pt adm with [facility].  On eval, she is weak and deconditioned with decrease balance in standing and mildly unsteady gait..  Pt's son feels he can handle her at the present level at home.  Rec HHPT    PT Assessment  Patient needs continued PT services    Follow Up Recommendations  Home health PT    Barriers to Discharge        lEquipment Recommendations  Defer to next venue    Recommendations for Other Services     Frequency Min 3X/week    Precautions / Restrictions Precautions Precautions: Fall   Pertinent Vitals/Pain       Mobility  Bed Mobility Bed Mobility: Left Sidelying to Sit;Sitting - Scoot to Edge of Bed Left Sidelying to Sit: 4: Min assist;With rails;HOB flat Sitting - Scoot to [place] of Bed: 4: Min guard Details for Bed Mobility Assistance: vc's for best/safest technique; truncal assist to come up enough to push up with L UE Transfers Transfers: Sit to Stand;Stand to Sit Sit to Stand: 3: Mod assist;With upper extremity assist;From bed;From chair/3-in-1;From toilet Stand to Sit: 4: Min assist;With upper extremity assist;To bed;To chair/3-in-1;To toilet Details for Transfer Assistance: vc's for hand placement; assist  to help come forward as pt pushes posteriorly on stand Ambulation/Gait Ambulation/Gait Assistance: 4: Min assist Ambulation Distance (Feet): 20 Feet ( then 12 x2 to bathroom) Assistive device: Rolling walker Ambulation/Gait Assistance Details: stability assist/ rw maneuvering assist; short guarded steps tremulous when she fatigues Gait Pattern: Step-through pattern;Decreased step length - right;Decreased step length - left;Decreased stride length Stairs: No Wheelchair Mobility Wheelchair Mobility: No    Exercises      PT Diagnosis: Generalized weakness;Difficulty walking  PT Problem List: Decreased strength;Decreased range of motion;Decreased activity tolerance;Decreased balance;Decreased mobility;Decreased knowledge of use of DME PT Treatment Interventions: DME instruction;Gait training;Stair training;Functional mobility training;Therapeutic activities;Balance training;Patient/family education   PT Goals Acute Rehab PT Goals PT Goal Formulation: With patient/family Time For Goal Achievement: 09/24/11 Potential to Achieve Goals: Good Pt will go Supine/Side to Sit: with supervision PT Goal: Supine/Side to Sit - Progress: Goal set today Pt will go Sit to Stand: with min assist PT Goal: Sit to Stand - Progress: Goal set today Pt will Transfer Bed to Chair/Chair to Bed: with min assist PT Transfer Goal: Bed to Chair/Chair to Bed - Progress: Goal set today Pt will Ambulate: with supervision PT Goal: Ambulate - Progress: Goal set today  Visit Information  Last PT Received On: 09/24/11 Assistance Needed: +1    Subjective Data  Patient Stated Goal: back home   Prior Functioning  Home Living Lives With: Son Available Help at Discharge: Family Type of Home: House Home Access: Stairs to enter Secretary/administrator of Steps: 2 Entrance Stairs-Rails: None Home Layout: One level;Able to live on main level with bedroom/bathroom Bathroom Shower/Tub: Tub/shower unit;Curtain Bathroom Toilet: Standard Home Adaptive Equipment: Walker - rolling;Straight cane;Wheelchair - manual Prior Function Level of Independence: Independent Able to Take Stairs?: Yes Driving: No Comments: Stays in the W/C in the house. W/c used on the stairs. Communication Communication: No difficulties    Cognition  Overall Cognitive Status: Appears within functional limits for tasks assessed/performed Arousal/Alertness: Awake/alert Orientation Level: Oriented X4 / Intact Behavior During Session: [facility] for tasks performed      Extremity/Trunk Assessment  Right Upper Extremity Assessment RUE ROM/Strength/Tone: Within functional levels Left Upper Extremity Assessment LUE ROM/Strength/Tone: WFL for tasks assessed (but weaker than R) Right Lower Extremity Assessment RLE ROM/Strength/Tone: Within functional levels;Deficits RLE ROM/Strength/Tone Deficits: knee and hip flexion limited/ tight Left Lower Extremity Assessment LLE ROM/Strength/Tone: WFL for tasks assessed;Deficits LLE ROM/Strength/Tone Deficits: knee ROM limited in flexion Trunk Assessment Trunk Assessment: Normal   Balance Balance Balance Assessed: Yes Static Sitting Balance Static Sitting - Balance Support: No upper extremity supported;Feet supported Static Sitting - Level of Assistance: 5: Stand by assistance Static Sitting - Comment/# of Minutes: 5  End of Session PT - End of Session Activity Tolerance: Patient tolerated treatment well Patient left: in chair;with call bell/phone within reach;with family/visitor present Nurse Communication: Mobility status   Ayme Short, Eliseo Gum 09/24/2011, 3:00 PM  09/24/2011  Brigantine Bing, PT [phone] (pager)

## 2011-09-24 NOTE — Care Management Note (Signed)
    Page 1 of 2   09/25/2011     11:15:44 AM   CARE MANAGEMENT NOTE 09/25/2011  Patient:  Sarah Perkins, Sarah Perkins   Account Number:  [id number]  Date Initiated:  09/20/2011  Documentation initiated by:  Letha Cape  Subjective/Objective Assessment:   dx bright red rectal bleeding  admit- lives with children.     Action/Plan:   pt eval   Anticipated DC Date:  09/25/2011   Anticipated DC Plan:  HOME W HOME HEALTH SERVICES      DC Planning Services  CM consult      [facility] Choice  HOME HEALTH  Resumption Of Svcs/PTA Provider   Choice offered to / List presented to:  C-4 Adult Children   DME arranged  OTHER - SEE COMMENT      DME agency  [facility]     [facility] arranged  HH-1 RN  HH-2 PT  HH-3 OT  HH-4 NURSE'S AIDE      HH agency  [facility]   Status of service:  Completed, signed off Medicare Important Message given?   (If response is "NO", the following Medicare IM given date fields will be blank) Date Medicare IM given:   Date Additional Medicare IM given:    Discharge Disposition:  HOME W HOME HEALTH SERVICES  Per UR Regulation:  Reviewed for med. necessity/level of care/duration of stay  If discussed at Long Length of Stay Meetings, dates discussed:   09/25/2011    Comments:  09/25/11 11:11 Letha Cape RN, BSN [phone] patient for dc today home with hh services, [place] notified.  09/24/11 14:52 Letha Cape RN, BSN [phone] patient's son , Leonette Most states patient is active with [facility], PT, OT, aide and they would like to resume hh services with them.  Son also states they need a cushion for their wheelchair.  Awaiting pt eval for recs. Per Leonette Most, he pays for patient's medications which are not expensive at all, she does not have Medicare part D, but she has transportation.  09/20/11 15:45 Letha Cape RN ,BSN  [phone] patient lives with children, await pt eval.  NCM will continue to follow for dc needs.

## 2011-09-24 NOTE — Progress Notes (Signed)
Agree with above 

## 2011-09-25 DIAGNOSIS — R112 Nausea with vomiting, unspecified: Secondary | ICD-10-CM

## 2011-09-25 DIAGNOSIS — K625 Hemorrhage of anus and rectum: Secondary | ICD-10-CM

## 2011-09-25 DIAGNOSIS — I10 Essential (primary) hypertension: Secondary | ICD-10-CM

## 2011-09-25 DIAGNOSIS — K219 Gastro-esophageal reflux disease without esophagitis: Secondary | ICD-10-CM

## 2011-09-25 LAB — CBC
HCT: 24.6 % — ABNORMAL LOW (ref 36.0–46.0)
MCHC: 33.3 g/dL (ref 30.0–36.0)
RDW: 17.4 % — ABNORMAL HIGH (ref 11.5–15.5)

## 2011-09-25 MED ORDER — POTASSIUM CHLORIDE CRYS ER 20 MEQ PO TBCR: 20.0000 meq | EXTENDED_RELEASE_TABLET | Freq: Two times a day (BID) | ORAL | Status: DC

## 2011-09-25 NOTE — Progress Notes (Signed)
Discharge instructions reviewed with son Kyrin Garn verbalize and understand. Skin sacrtal slightly redden and old healed scar under right heel. Foam dsg intact for protection.

## 2011-09-25 NOTE — Discharge Summary (Signed)
Physician Discharge Summary  Sarah Perkins AVW:[id number] DOB: 09-Jul-1917 DOA: 09/19/2011  PCP: Thayer Headings, MD, MD  Admit date: 09/19/2011 Discharge date: 09/25/2011  Discharge Diagnoses:  Principal Problem:  *Bright red rectal bleeding Active Problems:  Pseudomembranous colitis  Acute posthemorrhagic anemia   Discharge Condition: Stable  Disposition: The patient will be discharged to a skilled nursing facility. Here will check a CBC in 1 week. Also avoid NSAIDs and aspirin. And make sure she's not having melanotic stools. I have explained this to the patient and family.  History of present illness:  Ms Sarah Perkins was brought in to the ED by her son after she noticed some painless rectal bleeding which started this morning. She saw bright red blood, but was dark colored at some point. No hematemesis./ no fever. She takes a baby aspirin, but no other nsaids. She had similar bleeding in 2011, apparently diverticular bleed. Per her son, [facility] showed polyps. Patient denies dizziness, or palpitations or SOB. Her hemoglobin was 9.4 g/dl at presentation, higher than her baseline Of 8-8.8 in last admission in March this year. BP was 118-151 systolic.   Hospital Course:  Principal Problem:  *Bright red rectal bleeding: Surgery was consulted as there was concern for diverticular bleed. Bleeding scan was done that appeared to be originated in the transverse colon and possibly the right hepatic flexure. Colonoscopy was done on 09/22/2011 initial plan diverticulitis but no active bleeding. Surgery recommended conservative management monitor hemoglobin very closely. After 5 units of packed red blood cells her hemoglobin stabilized at 8.5 8.2. She was discharged in stable condition.   Pseudomembranous colitis: She was recently treated for C. Difficile colitis in March 2013 which she completed her course of Flagyl. Here in the hospital she denied to me any diarrhea. As per  the nurse she never had diarrhea either. Her C. difficile PCR was repeated which came back positive this probably a false positive. She had been recently treated. GIs recommended no further treatment.   Acute posthemorrhagic anemia: This probably most likely secondary to #1 please see #1 for details.   HTN  -BP currently controlled without use of any anti-hypertensives  Discharge Exam: Filed Vitals:   09/25/11 0448  BP: 128/64  Pulse: 85  Temp: 98.1 F (36.7 C)  Resp: 20   Filed Vitals:   09/24/11 0500 09/24/11 1300 09/24/11 2144 09/25/11 0448  BP: 121/63 120/70 112/61 128/64  Pulse: 92 80 92 85  Temp: 98.2 F (36.8 C) 98.4 F (36.9 C) 98 F (36.7 C) 98.1 F (36.7 C)  TempSrc: Oral Oral Oral Oral  Resp: 20 18 18 20   Height:      Weight:      SpO2: 95% 94% 92% 93%   Gen Exam: Awake and alert with clear speech.  Neck: Supple, No JVD.  Chest: B/L Clear. No rales or rhonchi  CVS: S1 S2 Regular, no murmurs.  Abdomen: soft, BS +, non tender, non distended.  Extremities: no edema, lower extremities warm to touch.  Neurologic: Non Focal.    Discharge Instructions  Discharge Orders    Future Orders Please Complete By Expires   Diet - low sodium heart healthy      Increase activity slowly        Medication List  As of 09/25/2011 12:08 PM   TAKE these medications         acetaminophen 325 MG tablet   Commonly known as: TYLENOL   Take 2 tablets (650 mg total) by  mouth every 6 (six) hours as needed for pain or fever (or Fever >/= 101).      amLODipine 5 MG tablet   Commonly known as: NORVASC   Take 1 tablet (5 mg total) by mouth daily.      aspirin 81 MG chewable tablet   Chew 81 mg by mouth daily.      CALCIUM 500 PO   Take 2 tablets by mouth daily.      cholecalciferol 1000 UNITS tablet   Commonly known as: VITAMIN D   Take 1,000 Units by mouth daily.      docusate sodium 100 MG capsule   Commonly known as: COLACE   Take 100 mg by mouth 2 (two) times daily.       feeding supplement Liqd   Take 237 mLs by mouth 3 (three) times daily with meals.      ferrous sulfate 325 (65 FE) MG tablet   Take 325 mg by mouth daily with breakfast.      furosemide 20 MG tablet   Commonly known as: LASIX   Take 1 tablet (20 mg total) by mouth daily.      omeprazole 20 MG capsule   Commonly known as: PRILOSEC   Take 20 mg by mouth daily as needed. For acid reflux      potassium chloride SA 20 MEQ tablet   Commonly known as: K-DUR,KLOR-CON   Take 20 mEq by mouth 2 (two) times daily.              The results of significant diagnostics from this hospitalization (including imaging, microbiology, ancillary and laboratory) are listed below for reference.    Significant Diagnostic Studies: Nm Gi Blood Loss  09/20/2011  *RADIOLOGY REPORT*  Clinical Data: History of GI bleeding with hematochezia.  NUCLEAR MEDICINE GASTROINTESTINAL BLEEDING STUDY  Technique:  Sequential abdominal images were obtained following intravenous administration of Tc-16m labeled red blood cells.  Radiopharmaceutical: CURIE ULTRATAG TECHNETIUM TC 72M- LABELED RED BLOOD CELLS IV KIT  Comparison: 08/13/2009  Findings: Radiopharmaceutical was identified in the expected vascular structures.  There is delayed filling of the urinary bladder.  During the second hour of imaging, there is radiopharmaceutical within a bowel structure in the mid abdomen. The radiopharmaceutical appears to move into the left abdomen. Findings are suggestive for bleeding involving the transverse colon.  IMPRESSION: Positive GI bleeding study.  The bleeding appears to be originating near the transverse colon and possibly the right hepatic flexure.  These results were called by telephone on 09/20/2011  at  6:21 p.m. to  Dr. Jerral Ralph, who verbally acknowledged these results.  Original Report Authenticated By: Richarda Overlie, M.D.    Microbiology: Recent Results (from the past 240 hour(s))  CLOSTRIDIUM DIFFICILE BY PCR      Status: Abnormal   Collection Time   09/19/11  6:07 PM      Component Value Range Status Comment   C difficile by pcr POSITIVE (*) NEGATIVE  Final   MRSA PCR SCREENING     Status: Normal   Collection Time   09/19/11  8:13 PM      Component Value Range Status Comment   MRSA by PCR NEGATIVE  NEGATIVE  Final      Labs: Basic Metabolic Panel:  Lab 09/22/11 2130 09/20/11 0309 09/19/11 1423  NA 138 142 140  K 3.3* 3.5 --  CL 107 111 105  CO2 24 25 26   GLUCOSE 106* 100* 137*  BUN 18 17 17  CREATININE 0.83 0.72 0.73  CALCIUM 7.8* 8.4 9.5  MG -- -- --  PHOS -- -- --   Liver Function Tests:  Lab 09/19/11 1423  AST 15  ALT 6  ALKPHOS 47  BILITOT 0.2*  PROT 8.2  ALBUMIN 2.6*   No results found for this basename: LIPASE:5,AMYLASE:5 in the last 168 hours No results found for this basename: AMMONIA:5 in the last 168 hours CBC:  Lab 09/25/11 0546 09/24/11 0847 09/23/11 1038 09/23/11 0319 09/22/11 1928 09/22/11 0311 09/21/11 1843 09/20/11 1407 09/19/11 1423  WBC 7.7 8.3 -- -- -- 12.7* 11.6* 9.6 --  NEUTROABS -- -- -- -- -- -- -- -- 6.3  HGB 8.2* 8.6* 8.5* 9.1* 9.4* -- -- -- --  HCT 24.6* 25.9* 25.1* 26.4* 27.3* -- -- -- --  MCV 89.1 87.5 -- -- -- 82.7 83.4 86.0 --  PLT 223 234 -- -- -- 185 208 238 --   Cardiac Enzymes: No results found for this basename: CKTOTAL:5,CKMB:5,CKMBINDEX:5,TROPONINI:5 in the last 168 hours BNP: No components found with this basename: POCBNP:5 CBG:  Lab 09/20/11 1806  GLUCAP 90    Time coordinating discharge: Greater than 45 minutes   Signed:  Marinda Elk  [facility] 09/25/2011, 12:08 PM

## 2011-09-25 NOTE — Progress Notes (Signed)
Physical Therapy Treatment Patient Details Name: Sarah Perkins MRN: [id number] DOB: [birth year] Today's Date: 09/25/2011 Time: 0102-7253 PT Time Calculation (min): 25 min  PT Assessment / Plan / Recommendation Comments on Treatment Session  Ms. Aydelotte did well. Will need significant physical assist at home (min-modA for all mobility) but son verbalizes understanding and willingness and ability to provide this assist. Plan for w/c cushion (Per case manager HHPT will bring to the house on their first visit). Son also verbalizes technique for w/c posterior transfer up steps into home.     Follow Up Recommendations  Home health PT    Barriers to Discharge        Equipment Recommendations  Wheelchair cushion (measurements)    Recommendations for Other Services    Frequency Min 3X/week   Plan Discharge plan remains appropriate;Frequency remains appropriate    Precautions / Restrictions Precautions Precautions: Fall Restrictions Weight Bearing Restrictions: No       Mobility  Bed Mobility Bed Mobility: Rolling Left Rolling Left: 3: Mod assist Left Sidelying to Sit: 3: Mod assist Sitting - Scoot to Edge of Bed: 3: Mod assist Details for Bed Mobility Assistance: cues for safe technique and sequencing for efficiency, again trunkal assist to be able to push through LUE for upright posture, cues for forward flexion as pt with posterior lean Transfers Transfers: Sit to Stand;Stand to Sit Sit to Stand: 3: Mod assist;From bed;From chair/3-in-1;With upper extremity assist Stand to Sit: To chair/3-in-1;With upper extremity assist;3: Mod assist;2: Max assist Details for Transfer Assistance: cues for safest hand position in prep to stand and sit; mod-max assist to lower to recliner, modA to lower to higher surface (3in1); mod cues for sequencing specifically hip flexion and anterior translation of trunk over BOS in sitting and standing Ambulation/Gait Ambulation/Gait Assistance: 4:  Min assist Ambulation Distance (Feet): 15 Feet Assistive device: Rolling walker Ambulation/Gait Assistance Details: encouragement for increased distance, very flexed gait, needed sequencing cues to step and move forward with RW initially but improved with distance Gait Pattern: Decreased stride length;Trunk flexed    Exercises General Exercises - Lower Extremity Ankle Circles/Pumps: AAROM;Both;10 reps;Supine Long Arc Quad: AROM;Both;10 reps;Seated Hip Flexion/Marching: AROM;Both;10 reps;Seated Heel Raises: AROM;Both;10 reps;Seated Other Exercises Other Exercises: seated hip hinges x10 for improved abdominal control and prep for anterior translation of trunk over feet in prep to stand     PT Goals Acute Rehab PT Goals PT Goal: Supine/Side to Sit - Progress: Progressing toward goal PT Goal: Sit to Stand - Progress: Progressing toward goal PT Transfer Goal: Bed to Chair/Chair to Bed - Progress: Progressing toward goal PT Goal: Ambulate - Progress: Progressing toward goal  Visit Information  Last PT Received On: 09/25/11 Assistance Needed: +1    Subjective Data  Subjective: I don't want to walk too much because I think I will fall.    Cognition  Overall Cognitive Status: Appears within functional limits for tasks assessed/performed Arousal/Alertness: Awake/alert Orientation Level: Appears intact for tasks assessed Behavior During Session: [facility] for tasks performed    Balance  Static Sitting Balance Static Sitting - Balance Support: Bilateral upper extremity supported Static Sitting - Level of Assistance: 5: Stand by assistance Static Sitting - Comment/# of Minutes: initially pt with heavy posterior lean able to correct with modA and maintain upright sitting with SBA and bilateral upper extremity assist Static Standing Balance Static Standing - Balance Support: Bilateral upper extremity supported Static Standing - Level of Assistance: 4: Min assist;3: Mod assist Static Standing -  Comment/#  of Minutes: initially upon standing pt with a pretty heavy posterior lean needing modA to prevent LOB; facilitation at trunk and hips for anterior weight shift and upright posture, perform lateral weight shift as well and pt able to stand with minA and bilateral upper extremities  End of Session PT - End of Session Equipment Utilized During Treatment: Gait belt Activity Tolerance: Patient tolerated treatment well Patient left: in chair;with call bell/phone within reach;with family/visitor present    [facility] HELEN 09/25/2011, 10:54 AM

## 2011-09-25 NOTE — Progress Notes (Signed)
Hopefully bleeding has subsided.   Call if bleeding recurs.

## 2011-09-25 NOTE — Progress Notes (Signed)
Patient ID: Sarah Perkins, female   DOB: 1917/11/23, 78 y.o.   MRN: [id number] 3 Days Post-Op  Subjective: Pt feels ok.  Had a BM last night with no further bleeding.  Tolerating full liquids with no complaints.  Objective: Vital signs in last 24 hours: Temp:  [98 F (36.7 C)-98.4 F (36.9 C)] 98.1 F (36.7 C) (05/29 0448) Pulse Rate:  [80-92] 85  (05/29 0448) Resp:  [18-20] 20  (05/29 0448) BP: (112-128)/(61-70) 128/64 mmHg (05/29 0448) SpO2:  [92 %-94 %] 93 % (05/29 0448) Last BM Date: 09/24/11  Intake/Output from previous day: 05/28 0701 - 05/29 0700 In: 540 [P.O.:540] Out: -  Intake/Output this shift:    PE: Abd: soft, NT, ND, +BS  Lab Results:   Basename 09/25/11 0546 09/24/11 0847  WBC 7.7 8.3  HGB 8.2* 8.6*  HCT 24.6* 25.9*  PLT 223 234   BMET No results found for this basename: NA:2,K:2,CL:2,CO2:2,GLUCOSE:2,BUN:2,CREATININE:2,CALCIUM:2 in the last 72 hours PT/INR No results found for this basename: LABPROT:2,INR:2 in the last 72 hours CMP     Component Value Date/Time   NA 138 09/22/2011 0311   K 3.3* 09/22/2011 0311   CL 107 09/22/2011 0311   CO2 24 09/22/2011 0311   GLUCOSE 106* 09/22/2011 0311   BUN 18 09/22/2011 0311   CREATININE 0.83 09/22/2011 0311   CREATININE 0.73 01/26/2009 1310   CALCIUM 7.8* 09/22/2011 0311   PROT 8.2 09/19/2011 1423   ALBUMIN 2.6* 09/19/2011 1423   AST 15 09/19/2011 1423   ALT 6 09/19/2011 1423   ALKPHOS 47 09/19/2011 1423   BILITOT 0.2* 09/19/2011 1423   GFRNONAA 60* 09/22/2011 0311   GFRAA 69* 09/22/2011 0311   Lipase  No results found for this basename: lipase       Studies/Results: No results found.  Anti-infectives: Anti-infectives    None       Assessment/Plan  1. Lower GI bleed 2. pandiverticulosis  Plan: 1. No further bleeding.  hgb stable.  Could advance diet as tolerates.  No surgical intervention planned at this time.   2.  Will sign off, but please call us back if patient begins to rebleed,   LOS: 6 days    Keyira Mondesir E 09/25/2011

## 2011-09-26 DIAGNOSIS — L89609 Pressure ulcer of unspecified heel, unspecified stage: Secondary | ICD-10-CM | POA: Diagnosis not present

## 2011-09-26 DIAGNOSIS — R269 Unspecified abnormalities of gait and mobility: Secondary | ICD-10-CM | POA: Diagnosis not present

## 2011-09-26 DIAGNOSIS — L8992 Pressure ulcer of unspecified site, stage 2: Secondary | ICD-10-CM | POA: Diagnosis not present

## 2011-09-26 DIAGNOSIS — M6281 Muscle weakness (generalized): Secondary | ICD-10-CM | POA: Diagnosis not present

## 2011-09-26 DIAGNOSIS — I509 Heart failure, unspecified: Secondary | ICD-10-CM | POA: Diagnosis not present

## 2011-10-01 DIAGNOSIS — R269 Unspecified abnormalities of gait and mobility: Secondary | ICD-10-CM | POA: Diagnosis not present

## 2011-10-01 DIAGNOSIS — I509 Heart failure, unspecified: Secondary | ICD-10-CM | POA: Diagnosis not present

## 2011-10-01 DIAGNOSIS — L89609 Pressure ulcer of unspecified heel, unspecified stage: Secondary | ICD-10-CM | POA: Diagnosis not present

## 2011-10-01 DIAGNOSIS — L8992 Pressure ulcer of unspecified site, stage 2: Secondary | ICD-10-CM | POA: Diagnosis not present

## 2011-10-01 DIAGNOSIS — M6281 Muscle weakness (generalized): Secondary | ICD-10-CM | POA: Diagnosis not present

## 2011-10-02 DIAGNOSIS — R269 Unspecified abnormalities of gait and mobility: Secondary | ICD-10-CM | POA: Diagnosis not present

## 2011-10-02 DIAGNOSIS — I509 Heart failure, unspecified: Secondary | ICD-10-CM | POA: Diagnosis not present

## 2011-10-02 DIAGNOSIS — L8992 Pressure ulcer of unspecified site, stage 2: Secondary | ICD-10-CM | POA: Diagnosis not present

## 2011-10-02 DIAGNOSIS — L89609 Pressure ulcer of unspecified heel, unspecified stage: Secondary | ICD-10-CM | POA: Diagnosis not present

## 2011-10-02 DIAGNOSIS — M6281 Muscle weakness (generalized): Secondary | ICD-10-CM | POA: Diagnosis not present

## 2011-10-03 DIAGNOSIS — R269 Unspecified abnormalities of gait and mobility: Secondary | ICD-10-CM | POA: Diagnosis not present

## 2011-10-03 DIAGNOSIS — M6281 Muscle weakness (generalized): Secondary | ICD-10-CM | POA: Diagnosis not present

## 2011-10-03 DIAGNOSIS — L8992 Pressure ulcer of unspecified site, stage 2: Secondary | ICD-10-CM | POA: Diagnosis not present

## 2011-10-03 DIAGNOSIS — L89609 Pressure ulcer of unspecified heel, unspecified stage: Secondary | ICD-10-CM | POA: Diagnosis not present

## 2011-10-03 DIAGNOSIS — I509 Heart failure, unspecified: Secondary | ICD-10-CM | POA: Diagnosis not present

## 2011-10-05 DIAGNOSIS — L89609 Pressure ulcer of unspecified heel, unspecified stage: Secondary | ICD-10-CM | POA: Diagnosis not present

## 2011-10-05 DIAGNOSIS — M6281 Muscle weakness (generalized): Secondary | ICD-10-CM | POA: Diagnosis not present

## 2011-10-05 DIAGNOSIS — L8992 Pressure ulcer of unspecified site, stage 2: Secondary | ICD-10-CM | POA: Diagnosis not present

## 2011-10-05 DIAGNOSIS — I509 Heart failure, unspecified: Secondary | ICD-10-CM | POA: Diagnosis not present

## 2011-10-05 DIAGNOSIS — R269 Unspecified abnormalities of gait and mobility: Secondary | ICD-10-CM | POA: Diagnosis not present

## 2011-10-08 DIAGNOSIS — L89609 Pressure ulcer of unspecified heel, unspecified stage: Secondary | ICD-10-CM | POA: Diagnosis not present

## 2011-10-08 DIAGNOSIS — M6281 Muscle weakness (generalized): Secondary | ICD-10-CM | POA: Diagnosis not present

## 2011-10-08 DIAGNOSIS — L8992 Pressure ulcer of unspecified site, stage 2: Secondary | ICD-10-CM | POA: Diagnosis not present

## 2011-10-08 DIAGNOSIS — I509 Heart failure, unspecified: Secondary | ICD-10-CM | POA: Diagnosis not present

## 2011-10-08 DIAGNOSIS — R269 Unspecified abnormalities of gait and mobility: Secondary | ICD-10-CM | POA: Diagnosis not present

## 2011-10-10 DIAGNOSIS — L8992 Pressure ulcer of unspecified site, stage 2: Secondary | ICD-10-CM | POA: Diagnosis not present

## 2011-10-10 DIAGNOSIS — R269 Unspecified abnormalities of gait and mobility: Secondary | ICD-10-CM | POA: Diagnosis not present

## 2011-10-10 DIAGNOSIS — L89609 Pressure ulcer of unspecified heel, unspecified stage: Secondary | ICD-10-CM | POA: Diagnosis not present

## 2011-10-10 DIAGNOSIS — I509 Heart failure, unspecified: Secondary | ICD-10-CM | POA: Diagnosis not present

## 2011-10-10 DIAGNOSIS — M6281 Muscle weakness (generalized): Secondary | ICD-10-CM | POA: Diagnosis not present

## 2011-10-14 DIAGNOSIS — I509 Heart failure, unspecified: Secondary | ICD-10-CM | POA: Diagnosis not present

## 2011-10-14 DIAGNOSIS — L89609 Pressure ulcer of unspecified heel, unspecified stage: Secondary | ICD-10-CM | POA: Diagnosis not present

## 2011-10-14 DIAGNOSIS — R269 Unspecified abnormalities of gait and mobility: Secondary | ICD-10-CM | POA: Diagnosis not present

## 2011-10-14 DIAGNOSIS — L8992 Pressure ulcer of unspecified site, stage 2: Secondary | ICD-10-CM | POA: Diagnosis not present

## 2011-10-14 DIAGNOSIS — M6281 Muscle weakness (generalized): Secondary | ICD-10-CM | POA: Diagnosis not present

## 2011-10-15 DIAGNOSIS — L8992 Pressure ulcer of unspecified site, stage 2: Secondary | ICD-10-CM | POA: Diagnosis not present

## 2011-10-15 DIAGNOSIS — L89609 Pressure ulcer of unspecified heel, unspecified stage: Secondary | ICD-10-CM | POA: Diagnosis not present

## 2011-10-15 DIAGNOSIS — R269 Unspecified abnormalities of gait and mobility: Secondary | ICD-10-CM | POA: Diagnosis not present

## 2011-10-15 DIAGNOSIS — I509 Heart failure, unspecified: Secondary | ICD-10-CM | POA: Diagnosis not present

## 2011-10-15 DIAGNOSIS — M6281 Muscle weakness (generalized): Secondary | ICD-10-CM | POA: Diagnosis not present

## 2011-10-16 DIAGNOSIS — L89609 Pressure ulcer of unspecified heel, unspecified stage: Secondary | ICD-10-CM | POA: Diagnosis not present

## 2011-10-16 DIAGNOSIS — I509 Heart failure, unspecified: Secondary | ICD-10-CM | POA: Diagnosis not present

## 2011-10-16 DIAGNOSIS — R269 Unspecified abnormalities of gait and mobility: Secondary | ICD-10-CM | POA: Diagnosis not present

## 2011-10-16 DIAGNOSIS — M6281 Muscle weakness (generalized): Secondary | ICD-10-CM | POA: Diagnosis not present

## 2011-10-16 DIAGNOSIS — L8992 Pressure ulcer of unspecified site, stage 2: Secondary | ICD-10-CM | POA: Diagnosis not present

## 2011-10-17 DIAGNOSIS — I509 Heart failure, unspecified: Secondary | ICD-10-CM | POA: Diagnosis not present

## 2011-10-17 DIAGNOSIS — L89609 Pressure ulcer of unspecified heel, unspecified stage: Secondary | ICD-10-CM | POA: Diagnosis not present

## 2011-10-17 DIAGNOSIS — L8992 Pressure ulcer of unspecified site, stage 2: Secondary | ICD-10-CM | POA: Diagnosis not present

## 2011-10-17 DIAGNOSIS — M6281 Muscle weakness (generalized): Secondary | ICD-10-CM | POA: Diagnosis not present

## 2011-10-17 DIAGNOSIS — R269 Unspecified abnormalities of gait and mobility: Secondary | ICD-10-CM | POA: Diagnosis not present

## 2011-10-22 DIAGNOSIS — M6281 Muscle weakness (generalized): Secondary | ICD-10-CM | POA: Diagnosis not present

## 2011-10-22 DIAGNOSIS — R269 Unspecified abnormalities of gait and mobility: Secondary | ICD-10-CM | POA: Diagnosis not present

## 2011-10-22 DIAGNOSIS — L89609 Pressure ulcer of unspecified heel, unspecified stage: Secondary | ICD-10-CM | POA: Diagnosis not present

## 2011-10-22 DIAGNOSIS — L8992 Pressure ulcer of unspecified site, stage 2: Secondary | ICD-10-CM | POA: Diagnosis not present

## 2011-10-22 DIAGNOSIS — I509 Heart failure, unspecified: Secondary | ICD-10-CM | POA: Diagnosis not present

## 2011-10-23 DIAGNOSIS — R609 Edema, unspecified: Secondary | ICD-10-CM | POA: Diagnosis not present

## 2011-10-26 DIAGNOSIS — R269 Unspecified abnormalities of gait and mobility: Secondary | ICD-10-CM | POA: Diagnosis not present

## 2011-10-26 DIAGNOSIS — L8992 Pressure ulcer of unspecified site, stage 2: Secondary | ICD-10-CM | POA: Diagnosis not present

## 2011-10-26 DIAGNOSIS — M6281 Muscle weakness (generalized): Secondary | ICD-10-CM | POA: Diagnosis not present

## 2011-10-26 DIAGNOSIS — I509 Heart failure, unspecified: Secondary | ICD-10-CM | POA: Diagnosis not present

## 2011-10-26 DIAGNOSIS — L89609 Pressure ulcer of unspecified heel, unspecified stage: Secondary | ICD-10-CM | POA: Diagnosis not present

## 2011-10-30 DIAGNOSIS — I509 Heart failure, unspecified: Secondary | ICD-10-CM | POA: Diagnosis not present

## 2011-10-30 DIAGNOSIS — L8992 Pressure ulcer of unspecified site, stage 2: Secondary | ICD-10-CM | POA: Diagnosis not present

## 2011-10-30 DIAGNOSIS — R269 Unspecified abnormalities of gait and mobility: Secondary | ICD-10-CM | POA: Diagnosis not present

## 2011-10-30 DIAGNOSIS — L89609 Pressure ulcer of unspecified heel, unspecified stage: Secondary | ICD-10-CM | POA: Diagnosis not present

## 2011-10-30 DIAGNOSIS — M6281 Muscle weakness (generalized): Secondary | ICD-10-CM | POA: Diagnosis not present

## 2011-11-01 DIAGNOSIS — M6281 Muscle weakness (generalized): Secondary | ICD-10-CM | POA: Diagnosis not present

## 2011-11-01 DIAGNOSIS — L8992 Pressure ulcer of unspecified site, stage 2: Secondary | ICD-10-CM | POA: Diagnosis not present

## 2011-11-01 DIAGNOSIS — R269 Unspecified abnormalities of gait and mobility: Secondary | ICD-10-CM | POA: Diagnosis not present

## 2011-11-01 DIAGNOSIS — I509 Heart failure, unspecified: Secondary | ICD-10-CM | POA: Diagnosis not present

## 2011-11-01 DIAGNOSIS — L89609 Pressure ulcer of unspecified heel, unspecified stage: Secondary | ICD-10-CM | POA: Diagnosis not present

## 2011-11-04 DIAGNOSIS — M6281 Muscle weakness (generalized): Secondary | ICD-10-CM | POA: Diagnosis not present

## 2011-11-04 DIAGNOSIS — I509 Heart failure, unspecified: Secondary | ICD-10-CM | POA: Diagnosis not present

## 2011-11-04 DIAGNOSIS — L89609 Pressure ulcer of unspecified heel, unspecified stage: Secondary | ICD-10-CM | POA: Diagnosis not present

## 2011-11-04 DIAGNOSIS — L8992 Pressure ulcer of unspecified site, stage 2: Secondary | ICD-10-CM | POA: Diagnosis not present

## 2011-11-04 DIAGNOSIS — R269 Unspecified abnormalities of gait and mobility: Secondary | ICD-10-CM | POA: Diagnosis not present

## 2011-11-06 DIAGNOSIS — L89609 Pressure ulcer of unspecified heel, unspecified stage: Secondary | ICD-10-CM | POA: Diagnosis not present

## 2011-11-06 DIAGNOSIS — I509 Heart failure, unspecified: Secondary | ICD-10-CM | POA: Diagnosis not present

## 2011-11-06 DIAGNOSIS — L8992 Pressure ulcer of unspecified site, stage 2: Secondary | ICD-10-CM | POA: Diagnosis not present

## 2011-11-06 DIAGNOSIS — M6281 Muscle weakness (generalized): Secondary | ICD-10-CM | POA: Diagnosis not present

## 2011-11-06 DIAGNOSIS — R269 Unspecified abnormalities of gait and mobility: Secondary | ICD-10-CM | POA: Diagnosis not present

## 2011-11-07 DIAGNOSIS — M6281 Muscle weakness (generalized): Secondary | ICD-10-CM | POA: Diagnosis not present

## 2011-11-07 DIAGNOSIS — L89609 Pressure ulcer of unspecified heel, unspecified stage: Secondary | ICD-10-CM | POA: Diagnosis not present

## 2011-11-07 DIAGNOSIS — I509 Heart failure, unspecified: Secondary | ICD-10-CM | POA: Diagnosis not present

## 2011-11-07 DIAGNOSIS — L8992 Pressure ulcer of unspecified site, stage 2: Secondary | ICD-10-CM | POA: Diagnosis not present

## 2011-11-07 DIAGNOSIS — R269 Unspecified abnormalities of gait and mobility: Secondary | ICD-10-CM | POA: Diagnosis not present

## 2011-11-08 ENCOUNTER — Encounter (HOSPITAL_COMMUNITY): Payer: Self-pay | Admitting: Emergency Medicine

## 2011-11-08 ENCOUNTER — Emergency Department (HOSPITAL_COMMUNITY)
Admission: EM | Admit: 2011-11-08 | Discharge: 2011-11-08 | Disposition: A | Discharge status: Home / Self Care | Payer: Medicare Other | Attending: Emergency Medicine | Admitting: Emergency Medicine

## 2011-11-08 DIAGNOSIS — N181 Chronic kidney disease, stage 1: Secondary | ICD-10-CM | POA: Diagnosis not present

## 2011-11-08 DIAGNOSIS — R000 Tachycardia, unspecified: Secondary | ICD-10-CM | POA: Diagnosis not present

## 2011-11-08 DIAGNOSIS — Z87440 Personal history of urinary (tract) infections: Secondary | ICD-10-CM | POA: Diagnosis not present

## 2011-11-08 DIAGNOSIS — I10 Essential (primary) hypertension: Secondary | ICD-10-CM | POA: Diagnosis not present

## 2011-11-08 DIAGNOSIS — I498 Other specified cardiac arrhythmias: Secondary | ICD-10-CM

## 2011-11-08 DIAGNOSIS — I129 Hypertensive chronic kidney disease with stage 1 through stage 4 chronic kidney disease, or unspecified chronic kidney disease: Secondary | ICD-10-CM | POA: Diagnosis not present

## 2011-11-08 DIAGNOSIS — Z860100 Personal history of colon polyps, unspecified: Secondary | ICD-10-CM | POA: Insufficient documentation

## 2011-11-08 DIAGNOSIS — I499 Cardiac arrhythmia, unspecified: Secondary | ICD-10-CM | POA: Diagnosis not present

## 2011-11-08 DIAGNOSIS — K219 Gastro-esophageal reflux disease without esophagitis: Secondary | ICD-10-CM | POA: Insufficient documentation

## 2011-11-08 DIAGNOSIS — I509 Heart failure, unspecified: Secondary | ICD-10-CM | POA: Diagnosis not present

## 2011-11-08 DIAGNOSIS — I5030 Unspecified diastolic (congestive) heart failure: Secondary | ICD-10-CM | POA: Diagnosis not present

## 2011-11-08 HISTORY — DX: Urinary tract infection, site not specified: N39.0

## 2011-11-08 NOTE — ED Notes (Signed)
Home health RN told pt to come to ED for "high HR" (around 100) and low oxygen level (93%).  PT denies any complaints.

## 2011-11-08 NOTE — ED Provider Notes (Addendum)
History     CSN: [id number]  Arrival date & time 11/08/11  1516   First MD Initiated Contact with Patient 11/08/11 1738      Chief Complaint  Patient presents with  . Tachycardia    (Consider location/radiation/quality/duration/timing/severity/associated sxs/prior treatment) The history is provided by the patient.  pt states had felt fine today, but that has home nurse 2x/day. Today the nurse visited, everything seemed fine, but then she called back later to say to go to ED. No labs/tests done. Pt denies any c/o, states has felt at baseline. Denies any chest pain or sob. No orthopnea. No cough or uri c/o. No fevers. No abd pain. No nvd. Eating well/normally. No gu c/o. Since last hospitalization, denies any rectal bleeding, no melena or hematochezia.      Past Medical History  Diagnosis Date  . CHF (congestive heart failure)   . Hypertension   . Aneurysm 1972    cerebral  . Acid reflux     occasional  . Tremor     worse on right arm  . History of GI diverticular bleed 06/2009    colonoscopy with endo clipping of bleeding tic by Dr Vida Rigger.  Flex sig by Dr Dulce Sellar.  . Acute blood loss anemia 06/2009    received ~ 6 units blood  . Adenomatous polyp of colon 06/2009  . Diverticulitis   . UTI (lower urinary tract infection)     Past Surgical History  Procedure Date  . Hip fracture surgery 2011  . Colonoscopy w/ control of hemorrhage 06/2009  . Knee arthroscopy   . Cerebral aneurysm repair 1970's  . Colonoscopy 09/22/2011    Procedure: COLONOSCOPY;  Surgeon: Charna Elizabeth, MD;  Location: [facility] ENDOSCOPY;  Service: Endoscopy;  Laterality: N/A;  wants ped scope    No family history on file.  History  Substance Use Topics  . Smoking status: Never Smoker   . Smokeless tobacco: Never Used  . Alcohol Use: No    OB History    Grav Para Term Preterm Abortions TAB SAB Ect Mult Living                  Review of Systems  Constitutional: Negative for fever and chills.  HENT:  Negative for neck pain.   Eyes: Negative for visual disturbance.  Respiratory: Negative for cough and shortness of breath.   Cardiovascular: Negative for chest pain.  Gastrointestinal: Negative for abdominal pain.  Genitourinary: Negative for flank pain.  Musculoskeletal: Negative for back pain.  Skin: Negative for rash.  Neurological: Negative for headaches.  Hematological: Does not bruise/bleed easily.  Psychiatric/Behavioral: Negative for confusion.    Allergies  Review of patient's allergies indicates no known allergies.  Home Medications   Current Outpatient Rx  Name Route Sig Dispense Refill  . AMLODIPINE BESYLATE 5 MG PO TABS Oral Take 1 tablet (5 mg total) by mouth daily.    . ASPIRIN 81 MG PO CHEW Oral Chew 81 mg by mouth daily.    Marland Kitchen CALCIUM 500 PO Oral Take 2 tablets by mouth daily.     Marland Kitchen VITAMIN D 1000 UNITS PO TABS Oral Take 1,000 Units by mouth daily.    Marland Kitchen DOCUSATE SODIUM 100 MG PO CAPS Oral Take 100 mg by mouth 2 (two) times daily.    Marland Kitchen ENSURE IMMUNE HEALTH PO LIQD Oral Take 237 mLs by mouth 3 (three) times daily with meals.    Di Kindle SULFATE 325 (65 FE) MG PO TABS Oral  Take 325 mg by mouth daily with breakfast.    . FUROSEMIDE 20 MG PO TABS Oral Take 1 tablet (20 mg total) by mouth daily.    Marland Kitchen NAPROXEN SODIUM 220 MG PO TABS Oral Take 220 mg by mouth daily as needed. For pain    . OMEPRAZOLE 20 MG PO CPDR Oral Take 20 mg by mouth daily as needed. For acid reflux    . POTASSIUM CHLORIDE CRYS ER 20 MEQ PO TBCR Oral Take 1 tablet (20 mEq total) by mouth 2 (two) times daily. 30 tablet 0  . PRIMIDONE 50 MG PO TABS Oral Take 50 mg by mouth daily as needed. For pain    . VITAMIN C 500 MG PO TABS Oral Take 500 mg by mouth daily.      BP 149/75  Pulse 85  Temp 98.4 F (36.9 C) (Oral)  Resp 18  SpO2 95%  Physical Exam  Nursing note and vitals reviewed. Constitutional: She is oriented to person, place, and time. She appears well-developed and well-nourished. No  distress.  HENT:  Nose: Nose normal.  Mouth/Throat: Oropharynx is clear and moist.  Eyes: Conjunctivae are normal. No scleral icterus.  Neck: Neck supple. No tracheal deviation present.  Cardiovascular: Normal rate, normal heart sounds and intact distal pulses.   Pulmonary/Chest: Effort normal and breath sounds normal. No respiratory distress.  Abdominal: Soft. Normal appearance and bowel sounds are normal. She exhibits no distension. There is no tenderness.  Musculoskeletal: She exhibits no edema and no tenderness.  Neurological: She is alert and oriented to person, place, and time.       Motor intact bil.   Skin: Skin is warm and dry. No rash noted.  Psychiatric: She has a normal mood and affect.    ED Course  Procedures (including critical care time)     MDM  Discussed w pt/family - they feel she is at baseline, no new symptoms or c/os.   Discussed w home health nurse re concern leading to referral to ED - she states that when she checked pts pulse earlier it was in 90-100 range but seemed irregular, so referred to ed. Pt otherwise seemed at baseline.    Reviewed nursing notes and prior charts for additional history.   Pt denies any c/o, no pain, no sob. No dysuria. No fever or chills. Requests d/c.     Date: 11/08/2011  Rate: 85  Rhythm: sinus arrhythmia  QRS Axis: normal  Intervals: normal  ST/T Wave abnormalities: normal  Conduction Disutrbances:none  Narrative Interpretation:   Old EKG Reviewed: unchanged       Suzi Roots, MD 11/08/11 1841  Suzi Roots, MD 11/08/11 1857

## 2011-11-09 DIAGNOSIS — I129 Hypertensive chronic kidney disease with stage 1 through stage 4 chronic kidney disease, or unspecified chronic kidney disease: Secondary | ICD-10-CM | POA: Diagnosis not present

## 2011-11-09 DIAGNOSIS — N181 Chronic kidney disease, stage 1: Secondary | ICD-10-CM | POA: Diagnosis not present

## 2011-11-09 DIAGNOSIS — I5030 Unspecified diastolic (congestive) heart failure: Secondary | ICD-10-CM | POA: Diagnosis not present

## 2011-11-12 DIAGNOSIS — N181 Chronic kidney disease, stage 1: Secondary | ICD-10-CM | POA: Diagnosis not present

## 2011-11-12 DIAGNOSIS — I129 Hypertensive chronic kidney disease with stage 1 through stage 4 chronic kidney disease, or unspecified chronic kidney disease: Secondary | ICD-10-CM | POA: Diagnosis not present

## 2011-11-12 DIAGNOSIS — I5030 Unspecified diastolic (congestive) heart failure: Secondary | ICD-10-CM | POA: Diagnosis not present

## 2011-11-13 DIAGNOSIS — I129 Hypertensive chronic kidney disease with stage 1 through stage 4 chronic kidney disease, or unspecified chronic kidney disease: Secondary | ICD-10-CM | POA: Diagnosis not present

## 2011-11-13 DIAGNOSIS — I5030 Unspecified diastolic (congestive) heart failure: Secondary | ICD-10-CM | POA: Diagnosis not present

## 2011-11-13 DIAGNOSIS — N181 Chronic kidney disease, stage 1: Secondary | ICD-10-CM | POA: Diagnosis not present

## 2011-11-14 DIAGNOSIS — N181 Chronic kidney disease, stage 1: Secondary | ICD-10-CM | POA: Diagnosis not present

## 2011-11-14 DIAGNOSIS — I5030 Unspecified diastolic (congestive) heart failure: Secondary | ICD-10-CM | POA: Diagnosis not present

## 2011-11-14 DIAGNOSIS — I129 Hypertensive chronic kidney disease with stage 1 through stage 4 chronic kidney disease, or unspecified chronic kidney disease: Secondary | ICD-10-CM | POA: Diagnosis not present

## 2011-11-16 DIAGNOSIS — I5030 Unspecified diastolic (congestive) heart failure: Secondary | ICD-10-CM | POA: Diagnosis not present

## 2011-11-16 DIAGNOSIS — I129 Hypertensive chronic kidney disease with stage 1 through stage 4 chronic kidney disease, or unspecified chronic kidney disease: Secondary | ICD-10-CM | POA: Diagnosis not present

## 2011-11-16 DIAGNOSIS — N181 Chronic kidney disease, stage 1: Secondary | ICD-10-CM | POA: Diagnosis not present

## 2011-11-18 DIAGNOSIS — N181 Chronic kidney disease, stage 1: Secondary | ICD-10-CM | POA: Diagnosis not present

## 2011-11-18 DIAGNOSIS — I129 Hypertensive chronic kidney disease with stage 1 through stage 4 chronic kidney disease, or unspecified chronic kidney disease: Secondary | ICD-10-CM | POA: Diagnosis not present

## 2011-11-18 DIAGNOSIS — I5030 Unspecified diastolic (congestive) heart failure: Secondary | ICD-10-CM | POA: Diagnosis not present

## 2011-11-21 DIAGNOSIS — N181 Chronic kidney disease, stage 1: Secondary | ICD-10-CM | POA: Diagnosis not present

## 2011-11-21 DIAGNOSIS — I129 Hypertensive chronic kidney disease with stage 1 through stage 4 chronic kidney disease, or unspecified chronic kidney disease: Secondary | ICD-10-CM | POA: Diagnosis not present

## 2011-11-21 DIAGNOSIS — I5030 Unspecified diastolic (congestive) heart failure: Secondary | ICD-10-CM | POA: Diagnosis not present

## 2011-11-25 DIAGNOSIS — I129 Hypertensive chronic kidney disease with stage 1 through stage 4 chronic kidney disease, or unspecified chronic kidney disease: Secondary | ICD-10-CM | POA: Diagnosis not present

## 2011-11-25 DIAGNOSIS — I5030 Unspecified diastolic (congestive) heart failure: Secondary | ICD-10-CM | POA: Diagnosis not present

## 2011-11-25 DIAGNOSIS — N181 Chronic kidney disease, stage 1: Secondary | ICD-10-CM | POA: Diagnosis not present

## 2011-11-27 DIAGNOSIS — I5030 Unspecified diastolic (congestive) heart failure: Secondary | ICD-10-CM | POA: Diagnosis not present

## 2011-11-27 DIAGNOSIS — N181 Chronic kidney disease, stage 1: Secondary | ICD-10-CM | POA: Diagnosis not present

## 2011-11-27 DIAGNOSIS — I129 Hypertensive chronic kidney disease with stage 1 through stage 4 chronic kidney disease, or unspecified chronic kidney disease: Secondary | ICD-10-CM | POA: Diagnosis not present

## 2011-12-12 DIAGNOSIS — I10 Essential (primary) hypertension: Secondary | ICD-10-CM | POA: Diagnosis not present

## 2011-12-12 DIAGNOSIS — R609 Edema, unspecified: Secondary | ICD-10-CM | POA: Diagnosis not present

## 2011-12-12 DIAGNOSIS — R32 Unspecified urinary incontinence: Secondary | ICD-10-CM | POA: Diagnosis not present

## 2011-12-13 DIAGNOSIS — R32 Unspecified urinary incontinence: Secondary | ICD-10-CM | POA: Diagnosis not present

## 2012-03-09 DIAGNOSIS — Z0000 Encounter for general adult medical examination without abnormal findings: Secondary | ICD-10-CM | POA: Diagnosis not present

## 2012-03-09 DIAGNOSIS — Z1331 Encounter for screening for depression: Secondary | ICD-10-CM | POA: Diagnosis not present

## 2012-03-09 DIAGNOSIS — I10 Essential (primary) hypertension: Secondary | ICD-10-CM | POA: Diagnosis not present

## 2012-03-09 DIAGNOSIS — Z23 Encounter for immunization: Secondary | ICD-10-CM | POA: Diagnosis not present

## 2012-03-09 DIAGNOSIS — D509 Iron deficiency anemia, unspecified: Secondary | ICD-10-CM | POA: Diagnosis not present

## 2012-03-09 DIAGNOSIS — R609 Edema, unspecified: Secondary | ICD-10-CM | POA: Diagnosis not present

## 2012-03-09 DIAGNOSIS — M810 Age-related osteoporosis without current pathological fracture: Secondary | ICD-10-CM | POA: Diagnosis not present

## 2012-03-16 DIAGNOSIS — I10 Essential (primary) hypertension: Secondary | ICD-10-CM | POA: Diagnosis not present

## 2012-03-16 DIAGNOSIS — M810 Age-related osteoporosis without current pathological fracture: Secondary | ICD-10-CM | POA: Diagnosis not present

## 2012-03-16 DIAGNOSIS — K219 Gastro-esophageal reflux disease without esophagitis: Secondary | ICD-10-CM | POA: Diagnosis not present

## 2012-03-18 DIAGNOSIS — I10 Essential (primary) hypertension: Secondary | ICD-10-CM | POA: Diagnosis not present

## 2012-04-18 ENCOUNTER — Encounter (HOSPITAL_COMMUNITY): Payer: Self-pay | Admitting: *Deleted

## 2012-04-18 ENCOUNTER — Inpatient Hospital Stay (HOSPITAL_COMMUNITY)
Admission: EM | Admit: 2012-04-18 | Discharge: 2012-04-28 | DRG: 378 | Disposition: A | Discharge status: Skilled Nursing Facility | Payer: Medicare Other | Attending: Internal Medicine | Admitting: Internal Medicine

## 2012-04-18 DIAGNOSIS — K5792 Diverticulitis of intestine, part unspecified, without perforation or abscess without bleeding: Secondary | ICD-10-CM

## 2012-04-18 DIAGNOSIS — F0390 Unspecified dementia without behavioral disturbance: Secondary | ICD-10-CM | POA: Diagnosis present

## 2012-04-18 DIAGNOSIS — I10 Essential (primary) hypertension: Secondary | ICD-10-CM

## 2012-04-18 DIAGNOSIS — R259 Unspecified abnormal involuntary movements: Secondary | ICD-10-CM | POA: Diagnosis present

## 2012-04-18 DIAGNOSIS — D649 Anemia, unspecified: Secondary | ICD-10-CM | POA: Diagnosis present

## 2012-04-18 DIAGNOSIS — N179 Acute kidney failure, unspecified: Secondary | ICD-10-CM | POA: Diagnosis present

## 2012-04-18 DIAGNOSIS — K922 Gastrointestinal hemorrhage, unspecified: Secondary | ICD-10-CM | POA: Diagnosis not present

## 2012-04-18 DIAGNOSIS — D62 Acute posthemorrhagic anemia: Secondary | ICD-10-CM

## 2012-04-18 DIAGNOSIS — N189 Chronic kidney disease, unspecified: Secondary | ICD-10-CM | POA: Diagnosis present

## 2012-04-18 DIAGNOSIS — K5730 Diverticulosis of large intestine without perforation or abscess without bleeding: Secondary | ICD-10-CM | POA: Diagnosis present

## 2012-04-18 DIAGNOSIS — K921 Melena: Principal | ICD-10-CM | POA: Diagnosis present

## 2012-04-18 DIAGNOSIS — R5381 Other malaise: Secondary | ICD-10-CM | POA: Diagnosis present

## 2012-04-18 DIAGNOSIS — I129 Hypertensive chronic kidney disease with stage 1 through stage 4 chronic kidney disease, or unspecified chronic kidney disease: Secondary | ICD-10-CM | POA: Diagnosis not present

## 2012-04-18 DIAGNOSIS — Z79899 Other long term (current) drug therapy: Secondary | ICD-10-CM

## 2012-04-18 DIAGNOSIS — Z860100 Personal history of colon polyps, unspecified: Secondary | ICD-10-CM

## 2012-04-18 DIAGNOSIS — K625 Hemorrhage of anus and rectum: Secondary | ICD-10-CM | POA: Diagnosis not present

## 2012-04-18 DIAGNOSIS — K219 Gastro-esophageal reflux disease without esophagitis: Secondary | ICD-10-CM

## 2012-04-18 DIAGNOSIS — I509 Heart failure, unspecified: Secondary | ICD-10-CM | POA: Diagnosis present

## 2012-04-18 DIAGNOSIS — N181 Chronic kidney disease, stage 1: Secondary | ICD-10-CM | POA: Diagnosis not present

## 2012-04-18 DIAGNOSIS — K5790 Diverticulosis of intestine, part unspecified, without perforation or abscess without bleeding: Secondary | ICD-10-CM | POA: Diagnosis present

## 2012-04-18 DIAGNOSIS — Z7982 Long term (current) use of aspirin: Secondary | ICD-10-CM

## 2012-04-18 DIAGNOSIS — A0472 Enterocolitis due to Clostridium difficile, not specified as recurrent: Secondary | ICD-10-CM

## 2012-04-18 DIAGNOSIS — E876 Hypokalemia: Secondary | ICD-10-CM | POA: Diagnosis not present

## 2012-04-18 LAB — CBC WITH DIFFERENTIAL/PLATELET
Basophils Relative: 0 % (ref 0–1)
Eosinophils Absolute: 0.3 10*3/uL (ref 0.0–0.7)
Lymphs Abs: 1.9 10*3/uL (ref 0.7–4.0)
MCH: 30.2 pg (ref 26.0–34.0)
MCHC: 32.9 g/dL (ref 30.0–36.0)
Neutrophils Relative %: 69 % (ref 43–77)
Platelets: 349 10*3/uL (ref 150–400)
RBC: 2.68 MIL/uL — ABNORMAL LOW (ref 3.87–5.11)

## 2012-04-18 LAB — COMPREHENSIVE METABOLIC PANEL
ALT: 8 U/L (ref 0–35)
AST: 12 U/L (ref 0–37)
Albumin: 2.8 g/dL — ABNORMAL LOW (ref 3.5–5.2)
Alkaline Phosphatase: 48 U/L (ref 39–117)
Potassium: 3.7 mEq/L (ref 3.5–5.1)
Sodium: 140 mEq/L (ref 135–145)
Total Protein: 7.4 g/dL (ref 6.0–8.3)

## 2012-04-18 LAB — PROTIME-INR
INR: 1.07 (ref 0.00–1.49)
Prothrombin Time: 13.8 seconds (ref 11.6–15.2)

## 2012-04-18 LAB — OCCULT BLOOD, POC DEVICE: Fecal Occult Bld: POSITIVE — AB

## 2012-04-18 MED ORDER — PANTOPRAZOLE SODIUM 40 MG IV SOLR
40.0000 mg | Freq: Once | INTRAVENOUS | Status: AC
  Administered 2012-04-18: 40 mg via INTRAVENOUS
  Filled 2012-04-18: qty 40

## 2012-04-18 NOTE — ED Notes (Signed)
Bright red blood in her stools for 2-3 days.  Increased bleeding for the past 2-3 hours.  She has a history of diverticulitis.  Alert no distress

## 2012-04-18 NOTE — ED Provider Notes (Signed)
History     CSN: [id number]  Arrival date & time 04/18/12  1950   First MD Initiated Contact with Patient 04/18/12 2306      Chief Complaint  Patient presents with  . Rectal Bleeding    (Consider location/radiation/quality/duration/timing/severity/associated sxs/prior treatment) HPI Comments: Ms. Charpentier presents from home with her son for evaluation.  He noticed faintly bloody stools when helping her clean yesterday.  Today he observed an increased volume of loose bloody stool.  She reports feeling well.  She denies any pain and is not currently taking any anticoagulants.  She does take prn naprosyn.  Patient is a 78 y.o. female presenting with hematochezia. The history is provided by the patient. No language interpreter was used.  Rectal Bleeding  The current episode started today. The problem has been unchanged. The patient is experiencing no pain. The stool is described as soft. Pertinent negatives include no anorexia, no fever, no abdominal pain, no diarrhea, no hematemesis, no hemorrhoids, no nausea, no rectal pain, no vomiting, no hematuria, no chest pain, no headaches, no coughing, no difficulty breathing and no rash. She has been behaving normally. She has been eating and drinking normally. The last void occurred less than 6 hours ago. Her past medical history does not include recent abdominal injury. There were no sick contacts. She has received no recent medical care.    Past Medical History  Diagnosis Date  . CHF (congestive heart failure)   . Hypertension   . Aneurysm 1972    cerebral  . Acid reflux     occasional  . Tremor     worse on right arm  . History of GI diverticular bleed 06/2009    colonoscopy with endo clipping of bleeding tic by Dr Vida Rigger.  Flex sig by Dr Dulce Sellar.  . Acute blood loss anemia 06/2009    received ~ 6 units blood  . Adenomatous polyp of colon 06/2009  . Diverticulitis   . UTI (lower urinary tract infection)     Past Surgical History   Procedure Date  . Hip fracture surgery 2011  . Colonoscopy w/ control of hemorrhage 06/2009  . Knee arthroscopy   . Cerebral aneurysm repair 1970's  . Colonoscopy 09/22/2011    Procedure: COLONOSCOPY;  Surgeon: Charna Elizabeth, MD;  Location: [facility] END[facility]COPY;  Service: Endoscopy;  Laterality: N/A;  wants ped scope    No family history on file.  History  Substance Use Topics  . Smoking status: Never Smoker   . Smokeless tobacco: Never Used  . Alcohol Use: No    OB History    Grav Para Term Preterm Abortions TAB SAB Ect Mult Living                  Review of Systems  Constitutional: Negative for fever, chills, activity change, appetite change and fatigue.  HENT: Negative for nosebleeds, congestion, sore throat and neck stiffness.   Respiratory: Negative for cough, chest tightness and shortness of breath.   Cardiovascular: Positive for leg swelling (chronic). Negative for chest pain and palpitations.  Gastrointestinal: Positive for blood in stool, hematochezia and anal bleeding. Negative for nausea, vomiting, abdominal pain, diarrhea, constipation, rectal pain, anorexia, hematemesis and hemorrhoids.  Genitourinary: Negative for dysuria, urgency, frequency, hematuria, flank pain and difficulty urinating.  Musculoskeletal: Positive for arthralgias (chronic). Negative for myalgias and back pain.  Skin: Positive for pallor. Negative for color change and rash.  Neurological: Negative for dizziness, tremors, syncope, weakness, light-headedness and headaches.  Hematological: Negative.  Psychiatric/Behavioral: Negative.     Allergies  Review of patient's allergies indicates no known allergies.  Home Medications   Current Outpatient Rx  Name  Route  Sig  Dispense  Refill  . AMLODIPINE BESYLATE 5 MG PO TABS   Oral   Take 1 tablet (5 mg total) by mouth daily.         . ASPIRIN 81 MG PO CHEW   Oral   Chew 81 mg by mouth daily.         Marland Kitchen CALCIUM 500 PO   Oral   Take 2 tablets by  mouth daily.          Marland Kitchen VITAMIN D 1000 UNITS PO TABS   Oral   Take 1,000 Units by mouth daily.         Marland Kitchen DOCUSATE SODIUM 100 MG PO CAPS   Oral   Take 100 mg by mouth 2 (two) times daily.         Marland Kitchen ENSURE IMMUNE HEALTH PO LIQD   Oral   Take 237 mLs by mouth 3 (three) times daily with meals.         . FERROUS SULFATE 325 (65 FE) MG PO TABS   Oral   Take 325 mg by mouth daily with breakfast.         . FUROSEMIDE 20 MG PO TABS   Oral   Take 1 tablet (20 mg total) by mouth daily.         Marland Kitchen NAPROXEN SODIUM 220 MG PO TABS   Oral   Take 220 mg by mouth daily as needed. For pain         . OMEPRAZOLE 20 MG PO CPDR   Oral   Take 20 mg by mouth daily as needed. For acid reflux         . POTASSIUM CHLORIDE CRYS ER 20 MEQ PO TBCR   Oral   Take 1 tablet (20 mEq total) by mouth 2 (two) times daily.   30 tablet   0   . PRIMIDONE 50 MG PO TABS   Oral   Take 50 mg by mouth daily as needed. For pain         . VITAMIN C 500 MG PO TABS   Oral   Take 500 mg by mouth daily.           BP 148/56  Pulse 50  Temp 98.1 F (36.7 C) (Oral)  Resp 15  SpO2 99%  Physical Exam  Nursing note and vitals reviewed. Constitutional: She appears well-developed and well-nourished. No distress.  HENT:  Head: Normocephalic and atraumatic.  Right Ear: External ear normal.  Left Ear: External ear normal.  Nose: Nose normal.  Mouth/Throat: Oropharynx is clear and moist. No oropharyngeal exudate.  Eyes: Conjunctivae normal are normal. Pupils are equal, round, and reactive to light. Right eye exhibits no discharge. Left eye exhibits no discharge. No scleral icterus.       Pale conjunctiva   Neck: Normal range of motion. No JVD present. No tracheal deviation present.  Cardiovascular: Normal rate, regular rhythm, intact distal pulses and normal pulses.   No extrasystoles are present. PMI is not displaced.  Exam reveals distant heart sounds. Exam reveals no gallop and no friction  rub.   No murmur heard. Pulmonary/Chest: Effort normal and breath sounds normal. No stridor. No respiratory distress. She has no wheezes. She has no rales. She exhibits no tenderness.  Abdominal: Soft. Bowel sounds are normal. She  exhibits no shifting dullness, no distension, no pulsatile liver, no abdominal bruit, no ascites, no pulsatile midline mass and no mass. There is no tenderness. There is no rigidity, no rebound, no guarding, no CVA tenderness, no tenderness at McBurney's point and negative Murphy's sign. No hernia.  Genitourinary: Rectal exam shows no external hemorrhoid, no internal hemorrhoid, no fissure, no mass, no tenderness and anal tone normal. Guaiac positive stool.       + gross rectal blood.  No blood noted in underwear (adult diaper).  Musculoskeletal: Normal range of motion. She exhibits no edema and no tenderness.  Lymphadenopathy:    She has no cervical adenopathy.  Neurological: She is alert. No cranial nerve deficit. She exhibits normal muscle tone. Coordination normal.       Disoriented to events.  Pt is a poor historian (assume secondary to dementia).  Skin: Skin is warm and dry. No rash noted. She is not diaphoretic. No erythema. There is pallor.  Psychiatric: She has a normal mood and affect. Her behavior is normal.    ED Course  Procedures (including critical care time)  Labs Reviewed  CBC WITH DIFFERENTIAL - Abnormal; Notable for the following:    WBC 10.8 (*)     RBC 2.68 (*)     Hemoglobin 8.1 (*)     HCT 24.6 (*)     Monocytes Absolute 1.2 (*)     All other components within normal limits  COMPREHENSIVE METABOLIC PANEL - Abnormal; Notable for the following:    Glucose, Bld 122 (*)     Albumin 2.8 (*)     Total Bilirubin 0.1 (*)     GFR calc non Af Amer 57 (*)     GFR calc Af Amer 66 (*)     All other components within normal limits  OCCULT BLOOD, POC DEVICE - Abnormal; Notable for the following:    Fecal Occult Bld POSITIVE (*)     All other  components within normal limits  SAMPLE TO BLOOD BANK  TYPE AND SCREEN  PROTIME-INR   No results found.   No diagnosis found.    MDM  Pt presents for evaluation of rectal bleeding.  She is pain free, appears nontoxic, note stable VS, NAD.  Gross, maroon colored blood noted on rectal exam.  She was seen in May of this year for similar bleeding.  Her H&H is the same today as it was on completion of the May admission.  Remainder of labs including LFTs and metabolic panel are unremarkable.  She demonstrates no evidence of shock currently.  Will contact the hospitalist for admission for further evaluation, serial H&Hs, and evaluation by Gastroenterology secondary to an acute GI bleed.          Tobin Chad, MD 04/18/12 [phone]

## 2012-04-19 ENCOUNTER — Inpatient Hospital Stay (HOSPITAL_COMMUNITY): Payer: Medicare Other

## 2012-04-19 DIAGNOSIS — M6281 Muscle weakness (generalized): Secondary | ICD-10-CM | POA: Diagnosis not present

## 2012-04-19 DIAGNOSIS — I10 Essential (primary) hypertension: Secondary | ICD-10-CM | POA: Diagnosis not present

## 2012-04-19 DIAGNOSIS — K625 Hemorrhage of anus and rectum: Secondary | ICD-10-CM

## 2012-04-19 DIAGNOSIS — Z86010 Personal history of colonic polyps: Secondary | ICD-10-CM | POA: Diagnosis not present

## 2012-04-19 DIAGNOSIS — K922 Gastrointestinal hemorrhage, unspecified: Secondary | ICD-10-CM

## 2012-04-19 DIAGNOSIS — K219 Gastro-esophageal reflux disease without esophagitis: Secondary | ICD-10-CM | POA: Diagnosis not present

## 2012-04-19 DIAGNOSIS — Z79899 Other long term (current) drug therapy: Secondary | ICD-10-CM | POA: Diagnosis not present

## 2012-04-19 DIAGNOSIS — K5730 Diverticulosis of large intestine without perforation or abscess without bleeding: Secondary | ICD-10-CM | POA: Diagnosis not present

## 2012-04-19 DIAGNOSIS — Z86718 Personal history of other venous thrombosis and embolism: Secondary | ICD-10-CM | POA: Diagnosis not present

## 2012-04-19 DIAGNOSIS — I129 Hypertensive chronic kidney disease with stage 1 through stage 4 chronic kidney disease, or unspecified chronic kidney disease: Secondary | ICD-10-CM | POA: Diagnosis present

## 2012-04-19 DIAGNOSIS — D62 Acute posthemorrhagic anemia: Secondary | ICD-10-CM | POA: Diagnosis present

## 2012-04-19 DIAGNOSIS — I509 Heart failure, unspecified: Secondary | ICD-10-CM | POA: Diagnosis present

## 2012-04-19 DIAGNOSIS — K921 Melena: Secondary | ICD-10-CM | POA: Diagnosis not present

## 2012-04-19 DIAGNOSIS — R5381 Other malaise: Secondary | ICD-10-CM | POA: Diagnosis present

## 2012-04-19 DIAGNOSIS — E876 Hypokalemia: Secondary | ICD-10-CM | POA: Diagnosis not present

## 2012-04-19 DIAGNOSIS — N181 Chronic kidney disease, stage 1: Secondary | ICD-10-CM | POA: Diagnosis not present

## 2012-04-19 DIAGNOSIS — D649 Anemia, unspecified: Secondary | ICD-10-CM | POA: Diagnosis not present

## 2012-04-19 DIAGNOSIS — F0390 Unspecified dementia without behavioral disturbance: Secondary | ICD-10-CM | POA: Diagnosis present

## 2012-04-19 DIAGNOSIS — Z7982 Long term (current) use of aspirin: Secondary | ICD-10-CM | POA: Diagnosis not present

## 2012-04-19 DIAGNOSIS — N189 Chronic kidney disease, unspecified: Secondary | ICD-10-CM | POA: Diagnosis not present

## 2012-04-19 DIAGNOSIS — R259 Unspecified abnormal involuntary movements: Secondary | ICD-10-CM | POA: Diagnosis present

## 2012-04-19 LAB — PROTIME-INR: INR: 1.18 (ref 0.00–1.49)

## 2012-04-19 LAB — CBC
HCT: 18 % — ABNORMAL LOW (ref 36.0–46.0)
Hemoglobin: 6.1 g/dL — CL (ref 12.0–15.0)
MCH: 29.2 pg (ref 26.0–34.0)
MCHC: 35 g/dL (ref 30.0–36.0)
MCV: 83.3 fL (ref 78.0–100.0)
Platelets: 167 10*3/uL (ref 150–400)
RDW: 15.8 % — ABNORMAL HIGH (ref 11.5–15.5)
WBC: 12 10*3/uL — ABNORMAL HIGH (ref 4.0–10.5)
WBC: 13 10*3/uL — ABNORMAL HIGH (ref 4.0–10.5)

## 2012-04-19 LAB — BASIC METABOLIC PANEL
Chloride: 108 mEq/L (ref 96–112)
GFR calc Af Amer: 72 mL/min — ABNORMAL LOW (ref >90)
Potassium: 3.9 mEq/L (ref 3.5–5.1)

## 2012-04-19 LAB — MRSA PCR SCREENING: MRSA by PCR: POSITIVE — AB

## 2012-04-19 LAB — PREPARE RBC (CROSSMATCH)

## 2012-04-19 MED ORDER — MUPIROCIN 2 % EX OINT
1.0000 "application " | TOPICAL_OINTMENT | Freq: Two times a day (BID) | CUTANEOUS | Status: AC
  Administered 2012-04-19 – 2012-04-23 (×10): 1 via NASAL
  Filled 2012-04-19 (×2): qty 22

## 2012-04-19 MED ORDER — PANTOPRAZOLE SODIUM 40 MG IV SOLR
40.0000 mg | Freq: Every day | INTRAVENOUS | Status: DC
  Administered 2012-04-19 – 2012-04-27 (×9): 40 mg via INTRAVENOUS
  Filled 2012-04-19 (×12): qty 40

## 2012-04-19 MED ORDER — MIDAZOLAM HCL 2 MG/2ML IJ SOLN
INTRAMUSCULAR | Status: DC | PRN
  Administered 2012-04-19 (×2): .25 mg via INTRAVENOUS

## 2012-04-19 MED ORDER — MIDAZOLAM HCL 2 MG/2ML IJ SOLN
INTRAMUSCULAR | Status: AC
  Filled 2012-04-19: qty 4

## 2012-04-19 MED ORDER — SODIUM CHLORIDE 0.9 % IJ SOLN
3.0000 mL | Freq: Two times a day (BID) | INTRAMUSCULAR | Status: DC
  Administered 2012-04-19 – 2012-04-28 (×17): 3 mL via INTRAVENOUS

## 2012-04-19 MED ORDER — TECHNETIUM TC 99M-LABELED RED BLOOD CELLS IV KIT
25.0000 | PACK | Freq: Once | INTRAVENOUS | Status: AC | PRN
  Administered 2012-04-19: 25 via INTRAVENOUS

## 2012-04-19 MED ORDER — SODIUM CHLORIDE 0.9 % IV SOLN
INTRAVENOUS | Status: AC
  Administered 2012-04-19: 1000 mL via INTRAVENOUS
  Administered 2012-04-19: 02:00:00 via INTRAVENOUS

## 2012-04-19 MED ORDER — CHLORHEXIDINE GLUCONATE CLOTH 2 % EX PADS
6.0000 | MEDICATED_PAD | Freq: Every day | CUTANEOUS | Status: AC
  Administered 2012-04-19 – 2012-04-23 (×5): 6 via TOPICAL

## 2012-04-19 MED ORDER — IOHEXOL 300 MG/ML  SOLN
150.0000 mL | Freq: Once | INTRAMUSCULAR | Status: AC | PRN
  Administered 2012-04-19: 120 mL via INTRA_ARTERIAL

## 2012-04-19 MED ORDER — ONDANSETRON HCL 4 MG PO TABS: 4.0000 mg | ORAL_TABLET | Freq: Four times a day (QID) | ORAL | Status: DC | PRN

## 2012-04-19 MED ORDER — FENTANYL CITRATE 0.05 MG/ML IJ SOLN
INTRAMUSCULAR | Status: DC | PRN
  Administered 2012-04-19 (×2): 25 ug via INTRAVENOUS

## 2012-04-19 MED ORDER — ONDANSETRON HCL 4 MG/2ML IJ SOLN: 4.0000 mg | Freq: Four times a day (QID) | INTRAMUSCULAR | Status: DC | PRN

## 2012-04-19 MED ORDER — FENTANYL CITRATE 0.05 MG/ML IJ SOLN
INTRAMUSCULAR | Status: AC
  Filled 2012-04-19: qty 4

## 2012-04-19 NOTE — Progress Notes (Signed)
[facility]             Progress Note   Subjective: Significant red blood per rectum today. Hb from 8 on admission to 6.1 on recheck 8 hours later. Is receiving 2nd unit of PRBCs presently. Son Leonette Most is present and has been updated on plan of care.  Objective: Vital signs in last 24 hours: Temp:  [97.7 F (36.5 C)-99.3 F (37.4 C)] 98.3 F (36.8 C) (12/22 0830) Pulse Rate:  [50-109] 94  (12/22 0830) Resp:  [15-20] 18  (12/22 0830) BP: (106-159)/(37-70) 112/60 mmHg (12/22 0830) SpO2:  [91 %-99 %] 97 % (12/22 0150) Weight:  [67.6 kg (149 lb 0.5 oz)] 67.6 kg (149 lb 0.5 oz) (12/22 0150) Weight change:     Intake/Output from previous day: 12/21 0701 - 12/22 0700 In: 310 [I.V.:310] Out: -      Physical Exam: General: Alert, awake, oriented x3. HEENT: No bruits, no goiter. Heart: Regular rate and rhythm, without murmurs, rubs, gallops. Lungs: Clear to auscultation bilaterally. Abdomen: Soft, nontender, nondistended, positive bowel sounds. Extremities: No clubbing cyanosis or edema with positive pedal pulses. Neuro: Grossly intact, nonfocal.    Lab Results: Basic Metabolic Panel:  Basename 04/19/12 0158 04/18/12 2011  NA 140 140  K 3.9 3.7  CL 108 102  CO2 25 28  GLUCOSE 118* 122*  BUN 15 14  CREATININE 0.80 0.86  CALCIUM 8.2* 9.0  MG -- --  PHOS -- --   Liver Function Tests:  [facility] 04/18/12 2011  AST 12  ALT 8  ALKPHOS 48  BILITOT 0.1*  PROT 7.4  ALBUMIN 2.8*    CBC:  Basename 04/19/12 0158 04/18/12 2011  WBC 12.0* 10.8*  NEUTROABS -- 7.4  HGB 6.1* 8.1*  HCT 18.0* 24.6*  MCV 90.5 91.8  PLT 269 349   Coagulation:  Basename 04/19/12 0158 04/18/12 2125  LABPROT 14.8 13.8  INR 1.18 1.07    Recent Results (from the past 240 hour(s))  MRSA PCR SCREENING     Status: Abnormal   Collection Time   04/19/12  2:13 AM      Component Value Range Status Comment   MRSA by PCR POSITIVE (*) NEGATIVE Final     Studies/Results: No  results found.  Medications: Scheduled Meds:   . Chlorhexidine Gluconate Cloth  6 each Topical Q0600  . mupirocin ointment  1 application Nasal BID  . pantoprazole (PROTONIX) IV  40 mg Intravenous QHS  . sodium chloride  3 mL Intravenous Q12H   Continuous Infusions:   . sodium chloride 75 mL/hr at 04/19/12 0227   PRN Meds:.ondansetron (ZOFRAN) IV, ondansetron  Assessment/Plan:  Principal Problem:  *Bright red rectal bleeding Active Problems:  Diverticulosis  HTN (hypertension)  CKD (chronic kidney disease), stage I  Anemia   Hematochezia -Massive amounts of GI bleeding this am. -Is receiving 2nd unit of PRBCs. -Have ordered to transfuse 2 more and hold an extra 4 units. -Increase IVF to 100 cc /hr. -Have requested a second access line to be placed. -Colonoscopy in 5/13 with diverticulosis, so presumed diverticular bleed. -Have discussed care with GI, Dr. Loreta Ave who has recommended a tagged RBC scan. -Have also discussed with [facility], Dr. Luisa Hart. -Patient will be transferred to ICU; have spoken with Dr. Sung Amabile who will assume care while in the ICU. -Was taking ASA prior to admission. This has been discontinued.   Time spent coordinating care: 50 minutes   LOS: 1 day   HERNANDEZ ACOSTA,ESTELA [facility] Pager: [phone]  04/19/2012, 9:09 AM

## 2012-04-19 NOTE — Consult Note (Signed)
Name: Sarah Perkins MRN: [id number] DOB: February 29, 1918    LOS: 1  Referring Provider:  [facility]  Reason for Referral:  Active GIB /ICU monitoring   PULMONARY / CRITICAL CARE MEDICINE  HPI: 78 yo WF admitted 12/21 with recurrent GIB w/ BRBPR for 2 days prior to admit . Admission hbg was 8.1 . Continued to have BRBPR  Had episode of this 08/2011 . Tagged scan showed multiple bleeding sites in sigmoid and TC colon. Previous colonoscopy w/ pandiverticulosis. B/p and HR stable without evidence of shock.  Transferred to ICU for planned resusciatation and embolization.     Past Medical History  Diagnosis Date  . CHF (congestive heart failure)   . Hypertension   . Aneurysm 1972    cerebral  . Acid reflux     occasional  . Tremor     worse on right arm  . History of GI diverticular bleed 06/2009    colonoscopy with endo clipping of bleeding tic by Dr Vida Rigger.  Flex sig by Dr Dulce Sellar.  . Acute blood loss anemia 06/2009    received ~ 6 units blood  . Adenomatous polyp of colon 06/2009  . Diverticulitis   . UTI (lower urinary tract infection)    Past Surgical History  Procedure Date  . Hip fracture surgery 2011  . Colonoscopy w/ control of hemorrhage 06/2009  . Knee arthroscopy   . Cerebral aneurysm repair 1970's  . Colonoscopy 09/22/2011    Procedure: COLONOSCOPY;  Surgeon: Charna Elizabeth, MD;  Location: [facility] ENDOSCOPY;  Service: Endoscopy;  Laterality: N/A;  wants ped scope   Prior to Admission medications   Medication Sig Start Date End Date Taking? Authorizing Provider  amLODipine (NORVASC) 5 MG tablet Take 1 tablet (5 mg total) by mouth daily. 05/21/11 05/20/12 Yes Vassie Loll, MD  aspirin 81 MG chewable tablet Chew 81 mg by mouth daily.   Yes Historical Provider, MD  Calcium Carbonate (CALCIUM 500 PO) Take 2 tablets by mouth daily.    Yes Historical Provider, MD  cholecalciferol (VITAMIN D) 1000 UNITS tablet Take 1,000 Units by mouth daily.   Yes Historical Provider, MD  docusate  sodium (COLACE) 100 MG capsule Take 100 mg by mouth 2 (two) times daily.   Yes Historical Provider, MD  feeding supplement (ENSURE IMMUNE HEALTH) LIQD Take 237 mLs by mouth 3 (three) times daily with meals. 05/21/11  Yes Vassie Loll, MD  ferrous sulfate 325 (65 FE) MG tablet Take 325 mg by mouth daily with breakfast.   Yes Historical Provider, MD  furosemide (LASIX) 20 MG tablet Take 1 tablet (20 mg total) by mouth daily. 05/21/11 05/20/12 Yes Vassie Loll, MD  naproxen sodium (ANAPROX) 220 MG tablet Take 220 mg by mouth daily as needed. For pain   Yes Historical Provider, MD  omeprazole (PRILOSEC) 20 MG capsule Take 20 mg by mouth daily as needed. For acid reflux   Yes Historical Provider, MD  potassium chloride SA (K-DUR,KLOR-CON) 20 MEQ tablet Take 1 tablet (20 mEq total) by mouth 2 (two) times daily. 09/25/11  Yes Marinda Elk, MD  primidone (MYSOLINE) 50 MG tablet Take 50 mg by mouth daily as needed. For pain   Yes Historical Provider, MD  vitamin C (ASCORBIC ACID) 500 MG tablet Take 500 mg by mouth daily.   Yes Historical Provider, MD   Allergies No Known Allergies  Family History No family history on file. Social History  reports that she has never smoked. She has never used smokeless  tobacco. She reports that she does not drink alcohol or use illicit drugs.  Review Of Systems:   Constitutional:   No  weight loss, night sweats,  Fevers, chills,  +fatigue, or  lassitude.  HEENT:   No headaches,  Difficulty swallowing,  Tooth/dental problems, or  Sore throat,                No sneezing, itching, ear ache, nasal congestion, post nasal drip,   CV:  No chest pain,  Orthopnea, PND,   anasarca, dizziness, palpitations, syncope.   GI  No heartburn, indigestion,  , nausea, vomiting, diarrhea,     Resp: No shortness of breath with exertion or at rest.  No excess mucus, no productive cough,  No non-productive cough,  No coughing up of blood.  No change in color of mucus.  No wheezing.   No chest wall deformity  Skin: no rash or lesions.  GU: no dysuria, change in color of urine, no urgency or frequency.  No flank pain, no hematuria   MS:  No joint pain or swelling.  No decreased range of motion.  No back pain.  Psych:  No change in mood or affect. No depression or anxiety.  No memory loss.    Brief patient description:   78 yo WF admitted 12/21 with recurrent GIB  Last  episode of this 08/2011 . Tagged scan showed multiple bleeding sites in sigmoid and TC colon. Previous colonoscopy w/ pandiverticulosis. B/p and HR stable without evidence of shock.  Transferred to ICU for planned resusciatation and embolization.    Events Since Admission: Transferred to ICU 04/19/2012 for active GIB and plan for emobolization   Current Status: Guarded   Vital Signs: Temp:  [97.7 F (36.5 C)-99.3 F (37.4 C)] 99.1 F (37.3 C) (12/22 1427) Pulse Rate:  [50-109] 92  (12/22 1430) Resp:  [15-20] 18  (12/22 1430) BP: (106-159)/(37-71) 111/46 mmHg (12/22 1430) SpO2:  [91 %-99 %] 93 % (12/22 1430) Weight:  [67.6 kg (149 lb 0.5 oz)] 67.6 kg (149 lb 0.5 oz) (12/22 0150)  Physical Examination: General:  NAD , appropriate  Neuro: al/o x 2  HEENT:  Dry mucosa  Neck:  No JVD  Cardiovascular:  NSR , 1+ edema  Lungs:  CTA bilaterally  Abdomen: Soft , BS + , mild tenderness  Musculoskeletal: intact  Skin:  Intact   Principal Problem:  *Bright red rectal bleeding Active Problems:  HTN (hypertension)  CKD (chronic kidney disease), stage I  Diverticulosis  Anemia   ASSESSMENT AND PLAN  PULMONARY No results found for this basename: PHART:5,PCO2:5,PCO2ART:5,PO2ART:5,HCO3:5,O2SAT:5 in the last 168 hours     A:  No apparent acute issues  P:   O2 for sat >90% w/ active bleeding   CARDIOVASCULAR No results found for this basename: TROPONINI:5,LATICACIDVEN:5, O2SATVEN:5,PROBNP:5 in the last 168 hours ECG:   Lines: PIV   A: Hx of HTN  Controlled without hypotension /Shock    P:  HTN meds on hold   RENAL  Lab 04/19/12 0158 04/18/12 2011  NA 140 140  K 3.9 3.7  CL 108 102  CO2 25 28  BUN 15 14  CREATININE 0.80 0.86  CALCIUM 8.2* 9.0  MG -- --  PHOS -- --   Intake/Output      12/21 0701 - 12/22 0700 12/22 0701 - 12/23 0700   I.V. (mL/kg) 310 (4.6)    Blood  365   Total Intake(mL/kg) 310 (4.6) 365 (5.4)   Net +310 +  365         Foley:    A:  No apparent acute issues > nml renal fxn  P:   Monitor   GASTROINTESTINAL  Lab 04/18/12 2011  AST 12  ALT 8  ALKPHOS 48  BILITOT 0.1*  PROT 7.4  ALBUMIN 2.8*    A:  Recurrent GIB w/ positive tagged scan for multiple bleeding sites in sigmoid and TC colon.   P:   GI following  PPI  Plans for embolization   HEMATOLOGIC  Lab 04/19/12 0158 04/18/12 2125 04/18/12 2011  HGB 6.1* -- 8.1*  HCT 18.0* -- 24.6*  PLT 269 -- 349  INR 1.18 1.07 --  APTT -- -- --   A:  Anemia from acute blood loss (GI source)  S/p Transfusion x 3 units   P:  PRBC x 3 units total for now  Monitor serial h/h every 8  INFECTIOUS  Lab 04/19/12 0158 04/18/12 2011  WBC 12.0* 10.8*  PROCALCITON -- --   Cultures:  Antibiotics:   A:  No apparent infectious cause  P:   Monitor   ENDOCRINE No results found for this basename: GLUCAP:5 in the last 168 hours A:  No apparent issues   P:   Monitor   NEUROLOGIC  A:  No acute issues , alert  P:   Monitor    BEST PRACTICE / DISPOSITION Level of Care:  ICU  Primary Service:  CCM  Consultants:  GI , IR  Code Status:  Full  Diet: NPO  DVT Px:  SCD  GI Px:  PPI  Skin Integrity:  Intact  Social / Family:  Updated son   PARRETT,TAMMY, NP  Pulmonary and Critical Care Medicine [facility] Pager: [phone]  04/19/2012, 2:34 PM  I have interviewed and examined the patient and reviewed the database. I have formulated the assessment and plan as reflected in the note above with amendments made by me.    Billy Fischer, MD;  PCCM service;  Mobile [phone]

## 2012-04-19 NOTE — Progress Notes (Signed)
Lab called with HGB result of 6.1. Pt with stable VS and no active bleeding noted. Results sent to MD. Orders placed in computer. Will cont to monitor and transfuse blood.

## 2012-04-19 NOTE — Consult Note (Signed)
Reason for Consult:rectal bleeding Referring Physician: Ardyth Harps MD  Sarah Perkins is an 78 y.o. female.  HPI: Asked to see pt by primary service due to [facility] for 1 day.  No HOTN or signs of shock noted.  Multiple bloody bright red BM noted yesterday by son.  History of this in 08/2011.  Tagged scan at that time showed multiple bleeding sites in sigmoid and TC colon.  History  Of pandiverticulosis by colonoscopy.  Bleeding stopped last time without intervention. Pt is comfortable and denies abdominal pain currently.  Past Medical History  Diagnosis Date  . CHF (congestive heart failure)   . Hypertension   . Aneurysm 1972    cerebral  . Acid reflux     occasional  . Tremor     worse on right arm  . History of GI diverticular bleed 06/2009    colonoscopy with endo clipping of bleeding tic by Dr Vida Rigger.  Flex sig by Dr Dulce Sellar.  . Acute blood loss anemia 06/2009    received ~ 6 units blood  . Adenomatous polyp of colon 06/2009  . Diverticulitis   . UTI (lower urinary tract infection)     Past Surgical History  Procedure Date  . Hip fracture surgery 2011  . Colonoscopy w/ control of hemorrhage 06/2009  . Knee arthroscopy   . Cerebral aneurysm repair 1970's  . Colonoscopy 09/22/2011    Procedure: COLONOSCOPY;  Surgeon: Charna Elizabeth, MD;  Location: [facility] ENDOSCOPY;  Service: Endoscopy;  Laterality: N/A;  wants ped scope    No family history on file.  Social History:  reports that she has never smoked. She has never used smokeless tobacco. She reports that she does not drink alcohol or use illicit drugs.  Allergies: No Known Allergies  Medications:  I have reviewed the patient's current medications. Prior to Admission:  Prescriptions prior to admission  Medication Sig Dispense Refill  . amLODipine (NORVASC) 5 MG tablet Take 1 tablet (5 mg total) by mouth daily.      Marland Kitchen aspirin 81 MG chewable tablet Chew 81 mg by mouth daily.      . Calcium Carbonate (CALCIUM 500 PO) Take 2  tablets by mouth daily.       . cholecalciferol (VITAMIN D) 1000 UNITS tablet Take 1,000 Units by mouth daily.      Marland Kitchen docusate sodium (COLACE) 100 MG capsule Take 100 mg by mouth 2 (two) times daily.      . feeding supplement (ENSURE IMMUNE HEALTH) LIQD Take 237 mLs by mouth 3 (three) times daily with meals.      . ferrous sulfate 325 (65 FE) MG tablet Take 325 mg by mouth daily with breakfast.      . furosemide (LASIX) 20 MG tablet Take 1 tablet (20 mg total) by mouth daily.      . naproxen sodium (ANAPROX) 220 MG tablet Take 220 mg by mouth daily as needed. For pain      . omeprazole (PRILOSEC) 20 MG capsule Take 20 mg by mouth daily as needed. For acid reflux      . potassium chloride SA (K-DUR,KLOR-CON) 20 MEQ tablet Take 1 tablet (20 mEq total) by mouth 2 (two) times daily.  30 tablet  0  . primidone (MYSOLINE) 50 MG tablet Take 50 mg by mouth daily as needed. For pain      . vitamin C (ASCORBIC ACID) 500 MG tablet Take 500 mg by mouth daily.       Scheduled:   .  Chlorhexidine Gluconate Cloth  6 each Topical Q0600  . mupirocin ointment  1 application Nasal BID  . pantoprazole (PROTONIX) IV  40 mg Intravenous QHS  . sodium chloride  3 mL Intravenous Q12H   Continuous:   . sodium chloride 75 mL/hr at 04/19/12 0227   UJW:JXBJYNWGNFA (ZOFRAN) IV, ondansetron  Results for orders placed during the hospital encounter of 04/18/12 (from the past 48 hour(s))  CBC WITH DIFFERENTIAL     Status: Abnormal   Collection Time   04/18/12  8:11 PM      Component Value Range Comment   WBC 10.8 (*) 4.0 - 10.5 K/uL    RBC 2.68 (*) 3.87 - 5.11 MIL/uL    Hemoglobin 8.1 (*) 12.0 - 15.0 g/dL    HCT 21.3 (*) 08.6 - 46.0 %    MCV 91.8  78.0 - 100.0 fL    MCH 30.2  26.0 - 34.0 pg    MCHC 32.9  30.0 - 36.0 g/dL    RDW 57.8  46.9 - 62.9 %    Platelets 349  150 - 400 K/uL    Neutrophils Relative 69  43 - 77 %    Neutro Abs 7.4  1.7 - 7.7 K/uL    Lymphocytes Relative 18  12 - 46 %    Lymphs Abs 1.9   0.7 - 4.0 K/uL    Monocytes Relative 11  3 - 12 %    Monocytes Absolute 1.2 (*) 0.1 - 1.0 K/uL    Eosinophils Relative 3  0 - 5 %    Eosinophils Absolute 0.3  0.0 - 0.7 K/uL    Basophils Relative 0  0 - 1 %    Basophils Absolute 0.0  0.0 - 0.1 K/uL   COMPREHENSIVE METABOLIC PANEL     Status: Abnormal   Collection Time   04/18/12  8:11 PM      Component Value Range Comment   Sodium 140  135 - 145 mEq/L    Potassium 3.7  3.5 - 5.1 mEq/L    Chloride 102  96 - 112 mEq/L    CO2 28  19 - 32 mEq/L    Glucose, Bld 122 (*) 70 - 99 mg/dL    BUN 14  6 - 23 mg/dL    Creatinine, Ser 5.28  0.50 - 1.10 mg/dL    Calcium 9.0  8.4 - 41.3 mg/dL    Total Protein 7.4  6.0 - 8.3 g/dL    Albumin 2.8 (*) 3.5 - 5.2 g/dL    AST 12  0 - 37 U/L    ALT 8  0 - 35 U/L    Alkaline Phosphatase 48  39 - 117 U/L    Total Bilirubin 0.1 (*) 0.3 - 1.2 mg/dL    GFR calc non Af Amer 57 (*) >90 mL/min    GFR calc Af Amer 66 (*) >90 mL/min   SAMPLE TO BLOOD BANK     Status: Normal   Collection Time   04/18/12  8:11 PM      Component Value Range Comment   Blood Bank Specimen SAMPLE AVAILABLE FOR TESTING      Sample Expiration 04/19/2012     TYPE AND SCREEN     Status: Normal (Preliminary result)   Collection Time   04/18/12  8:11 PM      Component Value Range Comment   ABO/RH(D) B POS      Antibody Screen NEG      Sample Expiration 04/21/2012  Unit Number [id number]      Blood Component Type RED CELLS,LR      Unit division 00      Status of Unit ISSUED      Transfusion Status OK TO TRANSFUSE      Crossmatch Result Compatible      Unit Number [id number]      Blood Component Type RED CELLS,LR      Unit division 00      Status of Unit ISSUED      Transfusion Status OK TO TRANSFUSE      Crossmatch Result Compatible      Unit Number [id number]      Blood Component Type RED CELLS,LR      Unit division 00      Status of Unit ALLOCATED      Transfusion Status OK TO TRANSFUSE      Crossmatch Result  Compatible      Unit Number [id number]      Blood Component Type RED CELLS,LR      Unit division 00      Status of Unit ALLOCATED      Transfusion Status OK TO TRANSFUSE      Crossmatch Result Compatible      Unit Number [id number]      Blood Component Type RED CELLS,LR      Unit division 00      Status of Unit ALLOCATED      Transfusion Status OK TO TRANSFUSE      Crossmatch Result Compatible      Unit Number [id number]      Blood Component Type RED CELLS,LR      Unit division 00      Status of Unit ALLOCATED      Transfusion Status OK TO TRANSFUSE      Crossmatch Result Compatible      Unit Number [id number]      Blood Component Type RED CELLS,LR      Unit division 00      Status of Unit ALLOCATED      Transfusion Status OK TO TRANSFUSE      Crossmatch Result Compatible      Unit Number [id number]      Blood Component Type RED CELLS,LR      Unit division 00      Status of Unit ALLOCATED      Transfusion Status OK TO TRANSFUSE      Crossmatch Result Compatible     PROTIME-INR     Status: Normal   Collection Time   04/18/12  9:25 PM      Component Value Range Comment   Prothrombin Time 13.8  11.6 - 15.2 seconds    INR 1.07  0.00 - 1.49   OCCULT BLOOD, POC DEVICE     Status: Abnormal   Collection Time   04/18/12 11:19 PM      Component Value Range Comment   Fecal Occult Bld POSITIVE (*) NEGATIVE   BASIC METABOLIC PANEL     Status: Abnormal   Collection Time   04/19/12  1:58 AM      Component Value Range Comment   Sodium 140  135 - 145 mEq/L    Potassium 3.9  3.5 - 5.1 mEq/L    Chloride 108  96 - 112 mEq/L    CO2 25  19 - 32 mEq/L    Glucose, Bld 118 (*) 70 - 99 mg/dL    BUN 15  6 - 23 mg/dL  Creatinine, Ser 0.80  0.50 - 1.10 mg/dL    Calcium 8.2 (*) 8.4 - 10.5 mg/dL    GFR calc non Af Amer 62 (*) >90 mL/min    GFR calc Af Amer 72 (*) >90 mL/min   CBC     Status: Abnormal   Collection Time   04/19/12  1:58 AM      Component Value Range Comment    WBC 12.0 (*) 4.0 - 10.5 K/uL    RBC 1.99 (*) 3.87 - 5.11 MIL/uL    Hemoglobin 6.1 (*) 12.0 - 15.0 g/dL    HCT 46.9 (*) 62.9 - 46.0 %    MCV 90.5  78.0 - 100.0 fL    MCH 30.7  26.0 - 34.0 pg    MCHC 33.9  30.0 - 36.0 g/dL    RDW 52.8  41.3 - 24.4 %    Platelets 269  150 - 400 K/uL   PROTIME-INR     Status: Normal   Collection Time   04/19/12  1:58 AM      Component Value Range Comment   Prothrombin Time 14.8  11.6 - 15.2 seconds    INR 1.18  0.00 - 1.49   MRSA PCR SCREENING     Status: Abnormal   Collection Time   04/19/12  2:13 AM      Component Value Range Comment   MRSA by PCR POSITIVE (*) NEGATIVE   PREPARE RBC (CROSSMATCH)     Status: Normal   Collection Time   04/19/12  3:00 AM      Component Value Range Comment   Order Confirmation ORDER PROCESSED BY BLOOD BANK     PREPARE RBC (CROSSMATCH)     Status: Normal   Collection Time   04/19/12  9:03 AM      Component Value Range Comment   Order Confirmation ORDER PROCESSED BY BLOOD BANK       No results found.  Review of Systems  HENT: Negative.   Respiratory: Negative.   Cardiovascular: Negative.   Gastrointestinal: Positive for blood in stool.  Genitourinary: Negative.   Musculoskeletal: Positive for myalgias.  Skin: Negative.   Neurological: Positive for weakness.  Endo/Heme/Allergies: Negative.   Psychiatric/Behavioral: Negative.    Blood pressure 106/58, pulse 88, temperature 98.3 F (36.8 C), temperature source Oral, resp. rate 18, height 5\' 7"  (1.702 m), weight 149 lb 0.5 oz (67.6 kg), SpO2 97.00%. Physical Exam  Constitutional: She is oriented to person, place, and time. No distress.  HENT:  Head: Atraumatic.  Eyes: EOM are normal. Pupils are equal, round, and reactive to light. No scleral icterus.  Neck: Normal range of motion. Neck supple.  Cardiovascular: Normal rate and regular rhythm.   Respiratory: Effort normal and breath sounds normal.  GI: Soft. She exhibits no distension. There is no  tenderness. There is no rebound and no guarding.  Musculoskeletal: Normal range of motion.  Neurological: She is alert and oriented to person, place, and time.  Skin: Skin is warm and dry. She is not diaphoretic.  Psychiatric: She has a normal mood and affect. Her behavior is normal. Judgment and thought content normal.    Assessment/Plan: Probable LGIB   Agree with transfer to ICU and resusitation. Agree with tagged scan.  Not a good operative candidate given age and other medical problems.  If possible,  Embolization would be next step and reserve total abdominal colectomy as last choice.  She appears stable and comfortable and hopefully this will stop on its  own.  Will follow.   Alick Lecomte A. 04/19/2012, 9:54 AM

## 2012-04-19 NOTE — Procedures (Signed)
Post mesenteric arteriogram.  No definitive bleeding site identified.  No embolization performed.  No immediate complications.  Keep right leg straight for 2 hrs.

## 2012-04-19 NOTE — H&P (Signed)
PCP:   Thayer Headings, MD   Chief Complaint:  Blood in stool  HPI: 78 yo female h/o dementia bed bound taken care of by her son at home comes in after he noted some brbpr that has worsened over the last 2 days.  2 days ago she had just a small amt of blood with her stool, then none yesterday.  Then today she had another bm which had more blood in it than 2 days ago.  Pt has been acting like herself.  No n/v/d/or fevers.  No complaints of abd pain.  She is on a baby asa a day.  She had a diverticular bleed in may of this year that resolved with conservative management.  edp rectal exam noted maroonish stool heme positive.  Review of Systems:  O/w unobtainable from pt, neg per son  Past Medical History: Past Medical History  Diagnosis Date  . CHF (congestive heart failure)   . Hypertension   . Aneurysm 1972    cerebral  . Acid reflux     occasional  . Tremor     worse on right arm  . History of GI diverticular bleed 06/2009    colonoscopy with endo clipping of bleeding tic by Dr Vida Rigger.  Flex sig by Dr Dulce Sellar.  . Acute blood loss anemia 06/2009    received ~ 6 units blood  . Adenomatous polyp of colon 06/2009  . Diverticulitis   . UTI (lower urinary tract infection)    Past Surgical History  Procedure Date  . Hip fracture surgery 2011  . Colonoscopy w/ control of hemorrhage 06/2009  . Knee arthroscopy   . Cerebral aneurysm repair 1970's  . Colonoscopy 09/22/2011    Procedure: COLONOSCOPY;  Surgeon: Charna Elizabeth, MD;  Location: [facility] ENDOSCOPY;  Service: Endoscopy;  Laterality: N/A;  wants ped scope    Medications: Prior to Admission medications   Medication Sig Start Date End Date Taking? Authorizing Provider  amLODipine (NORVASC) 5 MG tablet Take 1 tablet (5 mg total) by mouth daily. 05/21/11 05/20/12 Yes Vassie Loll, MD  aspirin 81 MG chewable tablet Chew 81 mg by mouth daily.   Yes Historical Provider, MD  Calcium Carbonate (CALCIUM 500 PO) Take 2 tablets by mouth daily.     Yes Historical Provider, MD  cholecalciferol (VITAMIN D) 1000 UNITS tablet Take 1,000 Units by mouth daily.   Yes Historical Provider, MD  docusate sodium (COLACE) 100 MG capsule Take 100 mg by mouth 2 (two) times daily.   Yes Historical Provider, MD  feeding supplement (ENSURE IMMUNE HEALTH) LIQD Take 237 mLs by mouth 3 (three) times daily with meals. 05/21/11  Yes Vassie Loll, MD  ferrous sulfate 325 (65 FE) MG tablet Take 325 mg by mouth daily with breakfast.   Yes Historical Provider, MD  furosemide (LASIX) 20 MG tablet Take 1 tablet (20 mg total) by mouth daily. 05/21/11 05/20/12 Yes Vassie Loll, MD  naproxen sodium (ANAPROX) 220 MG tablet Take 220 mg by mouth daily as needed. For pain   Yes Historical Provider, MD  omeprazole (PRILOSEC) 20 MG capsule Take 20 mg by mouth daily as needed. For acid reflux   Yes Historical Provider, MD  potassium chloride SA (K-DUR,KLOR-CON) 20 MEQ tablet Take 1 tablet (20 mEq total) by mouth 2 (two) times daily. 09/25/11  Yes Marinda Elk, MD  primidone (MYSOLINE) 50 MG tablet Take 50 mg by mouth daily as needed. For pain   Yes Historical Provider, MD  vitamin C (ASCORBIC  ACID) 500 MG tablet Take 500 mg by mouth daily.   Yes Historical Provider, MD    Allergies:  No Known Allergies  Social History:  reports that she has never smoked. She has never used smokeless tobacco. She reports that she does not drink alcohol or use illicit drugs.  Family History: neg  Physical Exam: Filed Vitals:   04/18/12 2006 04/18/12 2225 04/18/12 2345  BP: 134/47 148/56 158/49  Pulse: 109 50 91  Temp: 97.7 F (36.5 C) 98.1 F (36.7 C)   TempSrc: Oral    Resp: 20 15 18   SpO2: 91% 99% 96%   General appearance: alert, cooperative and no distress Neck: no JVD and supple, symmetrical, trachea midline Lungs: clear to auscultation bilaterally Heart: regular rate and rhythm, S1, S2 normal, no murmur, click, rub or gallop Abdomen: soft, non-tender; bowel sounds  normal; no masses,  no organomegaly Extremities: extremities normal, atraumatic, no cyanosis or edema Pulses: 2+ and symmetric Skin: Skin color, texture, turgor normal. No rashes or lesions Neurologic: Grossly normal    Labs on Admission:   [facility] 04/18/12 2011  NA 140  K 3.7  CL 102  CO2 28  GLUCOSE 122*  BUN 14  CREATININE 0.86  CALCIUM 9.0  MG --  PHOS --    Basename 04/18/12 2011  AST 12  ALT 8  ALKPHOS 48  BILITOT 0.1*  PROT 7.4  ALBUMIN 2.8*    Basename 04/18/12 2011  WBC 10.8*  NEUTROABS 7.4  HGB 8.1*  HCT 24.6*  MCV 91.8  PLT 349    Radiological Exams on Admission: No results found.  Assessment/Plan 78 yo female with hematochezia likely diverticular bleed  Principal Problem:  *Bright red rectal bleeding Active Problems:  HTN (hypertension)  CKD (chronic kidney disease), stage I  Diverticulosis  Anemia  Serial h/h q 8 hours.  Vss.  Hold asa.  If hgb drops below 8 consider transfusing.  However i suspect her hgb is somewhere around her baseline.  Her d/c hgb in may was the same, there is no other value in lab since that time.  ivf overnight.  Hopefully will improve with conservative tx.  abd exam is benign.  protonix iv.  Full code per son request.  DAVID,RACHAL A 04/19/2012, 12:14 AM

## 2012-04-20 LAB — BASIC METABOLIC PANEL
Calcium: 7.5 mg/dL — ABNORMAL LOW (ref 8.4–10.5)
Creatinine, Ser: 0.92 mg/dL (ref 0.50–1.10)
GFR calc Af Amer: 61 mL/min — ABNORMAL LOW (ref >90)
GFR calc non Af Amer: 52 mL/min — ABNORMAL LOW (ref >90)

## 2012-04-20 LAB — HEMOGLOBIN AND HEMATOCRIT, BLOOD: Hemoglobin: 7.1 g/dL — ABNORMAL LOW (ref 12.0–15.0)

## 2012-04-20 MED ORDER — SODIUM CHLORIDE 0.9 % IV SOLN
INTRAVENOUS | Status: DC
  Administered 2012-04-19 – 2012-04-21 (×2): 10 mL/h via INTRAVENOUS

## 2012-04-20 NOTE — Progress Notes (Signed)
Name: Sarah Perkins MRN: [id number] DOB: 12/07/1917    LOS: 2  Referring Provider:  [facility]  GI- [facility] Reason for Referral:  Active GIB /ICU monitoring   PULMONARY / CRITICAL CARE MEDICINE  HPI: 78 yo WF admitted 12/21 with recurrent GIB w/ BRBPR for 2 days prior to admit . Admission hbg was 8.1 . Continued to have BRBPR  Had episode of this 08/2011 . Tagged scan showed multiple bleeding sites in sigmoid and TC colon. Previous colonoscopy w/ pandiverticulosis. B/p and HR stable without evidence of shock.  Transferred to ICU for planned resuscitation and embolization. Nuclear scan -compatible with an active colonic hemorrhage  within the ascending colon.  12/22 mesenteric arteriogram. No definitive bleeding site identified. No embolization performed    Events Since Admission: Transferred to ICU 04/20/2012 for active GIB and plan for emobolization   Current Status:  episodic BRBPR - last 12/21 evening Denies abd al pain/ CP or dyspnea  Vital Signs: Temp:  [97 F (36.1 C)-99.1 F (37.3 C)] 97 F (36.1 C) (12/23 0822) Pulse Rate:  [45-109] 91  (12/23 0800) Resp:  [13-21] 16  (12/23 0800) BP: (82-152)/(38-98) 134/43 mmHg (12/23 0800) SpO2:  [77 %-100 %] 92 % (12/23 0800) Weight:  [66.6 kg (146 lb 13.2 oz)] 66.6 kg (146 lb 13.2 oz) (12/23 0428)  Physical Examination: General:  NAD , appropriate  Neuro: al/o x 2  HEENT:  Dry mucosa  Neck:  No JVD  Cardiovascular:  NSR , 1+ edema  Lungs:  CTA bilaterally  Abdomen: Soft , BS + , mild tenderness  Musculoskeletal: intact  Skin:  Intact   Principal Problem:  *Bright red rectal bleeding Active Problems:  HTN (hypertension)  CKD (chronic kidney disease), stage I  Diverticulosis  Anemia   ASSESSMENT AND PLAN  PULMONARY No results found for this basename: PHART:5,PCO2:5,PCO2ART:5,PO2ART:5,HCO3:5,O2SAT:5 in the last 168 hours    A:  No apparent acute issues  P:   O2 for sat >90% w/ active bleeding    CARDIOVASCULAR No results found for this basename: TROPONINI:5,LATICACIDVEN:5, O2SATVEN:5,PROBNP:5 in the last 168 hours ECG:   Lines: PIV   A: Hx of HTN  Controlled without hypotension /Shock   P:  HTN meds & lasix on hold   RENAL  Lab 04/20/12 0200 04/19/12 0158 04/18/12 2011  NA 139 140 140  K 3.8 3.9 --  CL 109 108 102  CO2 25 25 28   BUN 16 15 14   CREATININE 0.92 0.80 0.86  CALCIUM 7.5* 8.2* 9.0  MG -- -- --  PHOS -- -- --   Intake/Output      12/22 0701 - 12/23 0700 12/23 0701 - 12/24 0700   I.V. (mL/kg) 530 (8) 10 (0.2)   Blood 1065    IV Piggyback 10    Total Intake(mL/kg) 1605 (24.1) 10 (0.2)   Net +1605 +10        Urine Occurrence 5 x 1 x   Stool Occurrence 5 x     Foley:    A:  No apparent acute issues > nml renal fxn  P:   Monitor   GASTROINTESTINAL  Lab 04/18/12 2011  AST 12  ALT 8  ALKPHOS 48  BILITOT 0.1*  PROT 7.4  ALBUMIN 2.8*    A:  Recurrent GIB w/ positive tagged scan for multiple bleeding sites in sigmoid and TC colon.   P:   GI following  PPI  Poor surgical candidate for colectomy - try to avoid here  HEMATOLOGIC  Lab 04/20/12 0200 04/19/12 2038 04/19/12 0158 04/18/12 2125 04/18/12 2011  HGB 7.1* 7.7* 6.1* -- 8.1*  HCT 20.6* 22.0* 18.0* -- 24.6*  PLT -- 167 269 -- 349  INR -- -- 1.18 1.07 --  APTT -- -- -- -- --   A:  Anemia from acute blood loss (GI source)  S/p Transfusion x 3 units   P:  PRBC x 4 units total for now - w/f fluid overload Monitor serial h/h every 8  I  BEST PRACTICE / DISPOSITION Level of Care:  ICU  Primary Service:  CCM  Consultants:  GI , IR  Code Status:  Full  Diet: NPO  DVT Px:  SCD  GI Px:  PPI  Skin Integrity:  Intact  Social / Family:  Updated son   Oretha Milch. ,04/20/2012, 9:31 AM

## 2012-04-20 NOTE — Progress Notes (Signed)
  Bright red rectal bleeding  Assessment: Bright red rectal bleeding Lower GI bleed, hopefully stopped; poor operative candidate and if it comes to surgery will need total abd colectomy  Plan: Will follow   Subjective: No pain, no blood PR since MN  Objective: Vital signs in last 24 hours: Temp:  [97 F (36.1 C)-99.1 F (37.3 C)] 97 F (36.1 C) (12/23 0822) Pulse Rate:  [45-109] 91  (12/23 0800) Resp:  [13-21] 16  (12/23 0800) BP: (82-152)/(38-98) 134/43 mmHg (12/23 0800) SpO2:  [77 %-100 %] 92 % (12/23 0800) Weight:  [146 lb 13.2 oz (66.6 kg)] 146 lb 13.2 oz (66.6 kg) (12/23 0428) Last BM Date: 04/19/12  Intake/Output from previous day: 12/22 0701 - 12/23 0700 In: 1605 [I.V.:530; Blood:1065; IV Piggyback:10] Out: -  Intake/Output this shift: Total I/O In: 10 [I.V.:10] Out: -   General appearance: alert, cooperative, appears stated age and no distress GI: soft, non-tender; bowel sounds normal; no masses,  no organomegaly  Lab Results:  Results for orders placed during the hospital encounter of 04/18/12 (from the past 24 hour(s))  CBC     Status: Abnormal   Collection Time   04/19/12  8:38 PM      Component Value Range   WBC 13.0 (*) 4.0 - 10.5 K/uL   RBC 2.64 (*) 3.87 - 5.11 MIL/uL   Hemoglobin 7.7 (*) 12.0 - 15.0 g/dL   HCT 16.1 (*) 09.6 - 04.5 %   MCV 83.3  78.0 - 100.0 fL   MCH 29.2  26.0 - 34.0 pg   MCHC 35.0  30.0 - 36.0 g/dL   RDW 40.9 (*) 81.1 - 91.4 %   Platelets 167  150 - 400 K/uL  HEMOGLOBIN AND HEMATOCRIT, BLOOD     Status: Abnormal   Collection Time   04/20/12  2:00 AM      Component Value Range   Hemoglobin 7.1 (*) 12.0 - 15.0 g/dL   HCT 78.2 (*) 95.6 - 21.3 %  BASIC METABOLIC PANEL     Status: Abnormal   Collection Time   04/20/12  2:00 AM      Component Value Range   Sodium 139  135 - 145 mEq/L   Potassium 3.8  3.5 - 5.1 mEq/L   Chloride 109  96 - 112 mEq/L   CO2 25  19 - 32 mEq/L   Glucose, Bld 120 (*) 70 - 99 mg/dL   BUN 16  6 - 23  mg/dL   Creatinine, Ser 0.86  0.50 - 1.10 mg/dL   Calcium 7.5 (*) 8.4 - 10.5 mg/dL   GFR calc non Af Amer 52 (*) >90 mL/min   GFR calc Af Amer 61 (*) >90 mL/min     Studies/Results Radiology     MEDS, Scheduled    . Chlorhexidine Gluconate Cloth  6 each Topical Q0600  . mupirocin ointment  1 application Nasal BID  . pantoprazole (PROTONIX) IV  40 mg Intravenous QHS  . sodium chloride  3 mL Intravenous Q12H       LOS: 2 days    Currie Paris, MD, [facility], [place] [phone]   04/20/2012 9:19 AM

## 2012-04-20 NOTE — Progress Notes (Signed)
eLink Physician-Brief Progress Note Patient Name: Sarah Perkins DOB: 12-Jun-1917 MRN: [id number]  Date of Service  04/20/2012   HPI/Events of Note  Hgb 6.7  eICU Interventions  One unit Prbc ordered   Intervention Category Intermediate Interventions: Bleeding - evaluation and treatment with blood products  Shan Levans 04/20/2012, 6:28 PM

## 2012-04-20 NOTE — Progress Notes (Signed)
CBC reviewed Hg 7.1 with no lower bleeding, last episode of BBPR around 2200 04/19/2012   We will have 2 U of PRBC on hold; repeat Hgb in am

## 2012-04-21 DIAGNOSIS — I10 Essential (primary) hypertension: Secondary | ICD-10-CM

## 2012-04-21 LAB — CBC
HCT: 22.3 % — ABNORMAL LOW (ref 36.0–46.0)
Hemoglobin: 7.8 g/dL — ABNORMAL LOW (ref 12.0–15.0)
Hemoglobin: 7.6 g/dL — ABNORMAL LOW (ref 12.0–15.0)
MCH: 30 pg (ref 26.0–34.0)
MCH: 29.2 pg (ref 26.0–34.0)
MCHC: 34.1 g/dL (ref 30.0–36.0)
MCV: 85.8 fL (ref 78.0–100.0)
RBC: 2.6 MIL/uL — ABNORMAL LOW (ref 3.87–5.11)
RBC: 2.6 MIL/uL — ABNORMAL LOW (ref 3.87–5.11)

## 2012-04-21 LAB — BASIC METABOLIC PANEL
CO2: 22 mEq/L (ref 19–32)
Calcium: 7.9 mg/dL — ABNORMAL LOW (ref 8.4–10.5)
Chloride: 110 mEq/L (ref 96–112)
Creatinine, Ser: 0.78 mg/dL (ref 0.50–1.10)
Glucose, Bld: 80 mg/dL (ref 70–99)

## 2012-04-21 LAB — HEMOGLOBIN AND HEMATOCRIT, BLOOD: HCT: 21.9 % — ABNORMAL LOW (ref 36.0–46.0)

## 2012-04-21 NOTE — Progress Notes (Signed)
Name: Sarah Perkins MRN: [id number] DOB: 1917/10/19    LOS: 3  Referring Provider:  [facility]  GI- [facility] Reason for Referral:  Active GIB /ICU monitoring   PULMONARY / CRITICAL CARE MEDICINE  HPI: 78 yo WF admitted 12/21 with recurrent GIB w/ BRBPR for 2 days prior to admit . Admission hbg was 8.1 . Continued to have BRBPR  Had episode of this 08/2011 . Tagged scan showed multiple bleeding sites in sigmoid and TC colon. Previous colonoscopy w/ pandiverticulosis. B/p and HR stable without evidence of shock.  Transferred to ICU for planned resuscitation and embolization. Nuclear scan -compatible with an active colonic hemorrhage  within the ascending colon.      Events Since Admission: Transferred to ICU 04/19/2012 for active GIB and plan for emobolization  12/22 mesenteric arteriogram. No definitive bleeding site identified. No embolization performed 12/23 - move to sDU. Got 1 unit PRBC   SUBJECTIVE/OVERNIGHT/INTERVAL HX 04/21/12 - got PRBC 04/20/12 pm. No bleeding overnight. Son at bedside and expressiong vision of mom improving and doing rehab  Vital Signs: Temp:  [97.8 F (36.6 C)-98.7 F (37.1 C)] 98.1 F (36.7 C) (12/24 0800) Pulse Rate:  [103-107] 103  (12/24 0300) Resp:  [14-19] 14  (12/24 0300) BP: (115-151)/(48-100) 144/60 mmHg (12/24 0800) SpO2:  [96 %-100 %] 96 % (12/24 0300)  Physical Examination: General:  NAD , appropriate  Neuro: al/o x 2  HEENT:  Dry mucosa  Neck:  No JVD  Cardiovascular:  NSR , 1+ edema  Lungs:  CTA bilaterally  Abdomen: Soft , BS + , mild tenderness  Musculoskeletal: intact  Skin:  Intact   Principal Problem:  *Bright red rectal bleeding Active Problems:  HTN (hypertension)  CKD (chronic kidney disease), stage I  Diverticulosis  Anemia   ASSESSMENT AND PLAN  PULMONARY No results found for this basename: PHART:5,PCO2:5,PCO2ART:5,PO2ART:5,HCO3:5,O2SAT:5 in the last 168 hours    A:  No apparent acute issues  P:   O2  for sat >90% w/ active bleeding   CARDIOVASCULAR No results found for this basename: TROPONINI:5,LATICACIDVEN:5, O2SATVEN:5,PROBNP:5 in the last 168 hours ECG:   Lines: PIV   A: Hx of HTN  Controlled without hypotension Deliah Goody  04/21/12 - BP SBP 150  P:  HTN meds & lasix on hold  If BP contnues to be normal/high, reintroduce BP meds 04/22/12  RENAL  Lab 04/21/12 0430 04/20/12 0200 04/19/12 0158 04/18/12 2011  NA 142 139 140 140  K 3.3* 3.8 -- --  CL 110 109 108 102  CO2 22 25 25 28   BUN 17 16 15 14   CREATININE 0.78 0.92 0.80 0.86  CALCIUM 7.9* 7.5* 8.2* 9.0  MG -- -- -- --  PHOS -- -- -- --   Intake/Output      12/23 0701 - 12/24 0700 12/24 0701 - 12/25 0700   I.V. (mL/kg) 470 (7.1) 33 (0.5)   Blood 312.5    IV Piggyback     Total Intake(mL/kg) 782.5 (11.7) 33 (0.5)   Net +782.5 +33        Urine Occurrence 4 x     Foley:    A:  No apparent acute issues > nml renal fxn  P:   Monitor   GASTROINTESTINAL  Lab 04/18/12 2011  AST 12  ALT 8  ALKPHOS 48  BILITOT 0.1*  PROT 7.4  ALBUMIN 2.8*    A:  Recurrent GIB w/ positive tagged scan for multiple bleeding sites in sigmoid and TC colon.  P:   GI following  PPI  Poor surgical candidate for colectomy - try to avoid here  HEMATOLOGIC  Lab 04/21/12 0925 04/21/12 0430 04/21/12 0133 04/20/12 1744 04/20/12 0953 04/19/12 2038 04/19/12 0158 04/18/12 2125 04/18/12 2011  HGB 7.5* 7.8* 7.6* 6.7* 7.1* -- -- -- --  HCT 21.9* 22.3* 21.6* 19.2* 20.8* -- -- -- --  PLT -- 196 -- -- -- 167 269 -- 349  INR -- -- -- -- -- -- 1.18 1.07 --  APTT -- -- -- -- -- -- -- -- --   A:  Anemia from acute blood loss (GI source)  S/p Transfusion x 4-5 units as of 04/21/12 am  P:  Reduce H abnd H to q12h check PRBC for HGB < 7gm% only (NEJM 2013)  BEST PRACTICE / DISPOSITION Level of Care:  Move to tele 04/21/12 Primary Service:  CCM -> [facility] to assume 04/22/12 (d/e Dr Arthor Captain) Consultants:  GI , IR  Code Status:  Full   Diet: NPO  DVT Px:  SCD  GI Px:  PPI  Skin Integrity:  Intact  Social / Family:  Updated son      Dr. Kalman Shan, M.D., F.C.C.P Pulmonary and Critical Care Medicine Staff Physician [facility] [place] [facility] Pager: [phone], If no answer or between  15:00h - 7:00h: call [phone]  04/21/2012 11:16 AM

## 2012-04-21 NOTE — Progress Notes (Signed)
Patient being transferred to 6700 per MD order, Report called Nego, will transfer in bed.

## 2012-04-21 NOTE — Progress Notes (Signed)
Bright red rectal bleeding  Assessment: Bright red rectal bleeding Lower GI bleed, appears stable  Plan: We will continue to follow in case she re-bleeds. She would require total abdominal colectomy if bleeds and doesn't stop. Might consider another angio and possible embolization if more bleeding, and surgery if that is not successful. Hgb seems stable now so perhaps she will avoid any intervention   Subjective: No pain, doesn't know if she has had a bloody BM. Nurse reports only a "smear" of blood overnight.  Objective: Vital signs in last 24 hours: Temp:  [97 F (36.1 C)-98.7 F (37.1 C)] 98.1 F (36.7 C) (12/24 0740) Pulse Rate:  [103-107] 103  (12/24 0300) Resp:  [14-19] 14  (12/24 0300) BP: (115-151)/(48-100) 151/100 mmHg (12/24 0300) SpO2:  [96 %-100 %] 96 % (12/24 0300) Last BM Date: 04/19/12  Intake/Output from previous day: 12/23 0701 - 12/24 0700 In: 772.5 [I.V.:460; Blood:312.5] Out: -  Intake/Output this shift:    General appearance: alert, appears stated age and no distress GI: soft, non-tender; bowel sounds normal; no masses,  no organomegaly  Lab Results:  Results for orders placed during the hospital encounter of 04/18/12 (from the past 24 hour(s))  HEMOGLOBIN AND HEMATOCRIT, BLOOD     Status: Abnormal   Collection Time   04/20/12  9:53 AM      Component Value Range   Hemoglobin 7.1 (*) 12.0 - 15.0 g/dL   HCT 16.1 (*) 09.6 - 04.5 %  HEMOGLOBIN AND HEMATOCRIT, BLOOD     Status: Abnormal   Collection Time   04/20/12  5:44 PM      Component Value Range   Hemoglobin 6.7 (*) 12.0 - 15.0 g/dL   HCT 40.9 (*) 81.1 - 91.4 %  HEMOGLOBIN AND HEMATOCRIT, BLOOD     Status: Abnormal   Collection Time   04/21/12  1:33 AM      Component Value Range   Hemoglobin 7.6 (*) 12.0 - 15.0 g/dL   HCT 78.2 (*) 95.6 - 21.3 %  CBC     Status: Abnormal   Collection Time   04/21/12  4:30 AM      Component Value Range   WBC 13.3 (*) 4.0 - 10.5 K/uL   RBC 2.60 (*) 3.87 -  5.11 MIL/uL   Hemoglobin 7.8 (*) 12.0 - 15.0 g/dL   HCT 08.6 (*) 57.8 - 46.9 %   MCV 85.8  78.0 - 100.0 fL   MCH 30.0  26.0 - 34.0 pg   MCHC 35.0  30.0 - 36.0 g/dL   RDW 62.9 (*) 52.8 - 41.3 %   Platelets 196  150 - 400 K/uL  BASIC METABOLIC PANEL     Status: Abnormal   Collection Time   04/21/12  4:30 AM      Component Value Range   Sodium 142  135 - 145 mEq/L   Potassium 3.3 (*) 3.5 - 5.1 mEq/L   Chloride 110  96 - 112 mEq/L   CO2 22  19 - 32 mEq/L   Glucose, Bld 80  70 - 99 mg/dL   BUN 17  6 - 23 mg/dL   Creatinine, Ser 2.44  0.50 - 1.10 mg/dL   Calcium 7.9 (*) 8.4 - 10.5 mg/dL   GFR calc non Af Amer 70 (*) >90 mL/min   GFR calc Af Amer 82 (*) >90 mL/min     Studies/Results Radiology     MEDS, Scheduled    . Chlorhexidine Gluconate Cloth  6 each  Topical Q0600  . mupirocin ointment  1 application Nasal BID  . pantoprazole (PROTONIX) IV  40 mg Intravenous QHS  . sodium chloride  3 mL Intravenous Q12H       LOS: 3 days    Currie Paris, MD, [facility], [place] [phone]   04/21/2012 8:02 AM

## 2012-04-22 LAB — TYPE AND SCREEN
Antibody Screen: NEGATIVE
Unit division: 0
Unit division: 0
Unit division: 0

## 2012-04-22 LAB — BASIC METABOLIC PANEL
BUN: 16 mg/dL (ref 6–23)
CO2: 18 mEq/L — ABNORMAL LOW (ref 19–32)
GFR calc non Af Amer: 75 mL/min — ABNORMAL LOW (ref >90)
Glucose, Bld: 62 mg/dL — ABNORMAL LOW (ref 70–99)
Potassium: 3.5 mEq/L (ref 3.5–5.1)

## 2012-04-22 LAB — CBC
Hemoglobin: 8.3 g/dL — ABNORMAL LOW (ref 12.0–15.0)
MCH: 29.7 pg (ref 26.0–34.0)
Platelets: 288 10*3/uL (ref 150–400)
RBC: 2.79 MIL/uL — ABNORMAL LOW (ref 3.87–5.11)
WBC: 10.1 10*3/uL (ref 4.0–10.5)

## 2012-04-22 LAB — HEMOGLOBIN AND HEMATOCRIT, BLOOD: Hemoglobin: 7.5 g/dL — ABNORMAL LOW (ref 12.0–15.0)

## 2012-04-22 LAB — MAGNESIUM: Magnesium: 1.9 mg/dL (ref 1.5–2.5)

## 2012-04-22 NOTE — Progress Notes (Signed)
Agree with A&P of JD,PA. Unfortunately no CBC available yet. She has had some more blood tinged stool and may yet need some intervention

## 2012-04-22 NOTE — Progress Notes (Signed)
  Subjective: Appears comfortable, no new c/o   Objective: Vital signs in last 24 hours: Temp:  [97.6 F (36.4 C)-98.4 F (36.9 C)] 97.8 F (36.6 C) (12/25 0507) Pulse Rate:  [100-104] 104  (12/25 0507) Resp:  [15-16] 16  (12/25 0507) BP: (137-157)/(54-83) 157/83 mmHg (12/25 0507) SpO2:  [93 %-95 %] 95 % (12/25 0507) Last BM Date: 04/19/12  Intake/Output from previous day: 12/24 0701 - 12/25 0700 In: 113 [I.V.:113] Out: -  Intake/Output this shift:    General appearance: alert, cooperative, appears stated age and no distress Abdomen: non tender, non distended, + BS, no further "bloody BM" reported. Labs: CBC pending VSS, afebrile  Lab Results:   [facility] 04/21/12 2149 04/21/12 0925 04/21/12 0430  WBC 11.7* -- 13.3*  HGB 7.6* 7.5* --  HCT 22.3* 21.9* --  PLT 253 -- 196   BMET  Basename 04/22/12 0500 04/21/12 0430  NA 141 142  K 3.5 3.3*  CL 107 110  CO2 18* 22  GLUCOSE 62* 80  BUN 16 17  CREATININE 0.64 0.78  CALCIUM 8.2* 7.9*   PT/INR No results found for this basename: LABPROT:2,INR:2 in the last 72 hours ABG No results found for this basename: PHART:2,PCO2:2,PO2:2,HCO3:2 in the last 72 hours  Studies/Results: No results found.  Anti-infectives: Anti-infectives    None      Assessment/Plan:  Patient Active Problem List  Diagnosis  . HTN (hypertension)  . CKD (chronic kidney disease), stage I  . GERD (gastroesophageal reflux disease)  . Diverticulosis  . Diverticulitis  . Bright red rectal bleeding  . Pseudomembranous colitis  . Acute posthemorrhagic anemia  . Anemia    We will continue to follow in case she re-bleeds. She would require total abdominal colectomy if bleeds and doesn't stop. Might consider another angio and possible embolization if more bleeding, and surgery if that is not successful.    s/p * No surgery found *   LOS: 4 days    Sarah Perkins [facility] Pager # [phone] 04/22/2012

## 2012-04-22 NOTE — Progress Notes (Signed)
[facility] PROGRESS NOTE  Sarah Perkins YQM:[id number] DOB: 05-16-1917 DOA: 04/18/2012 PCP: Thayer Headings, MD  Brief narrative:  78 y/o female with dementia  , HTN hx of diverticular bleed in may 2013 ( Tagged scan showed multiple bleeding sites in sigmoid and transverse  colon.  colonoscopy  With pandiverticulosis.managed conservatively)    admitted 12/21 with  BRBPR for 2 days prior to admit . Admission hemoglobin  was 8.1 . Transferred to ICU for planned resuscitation and embolization.  mesenteric arteriogram this admission without  definitive bleeding site identified. No embolization performed.   Assessment/Plan:  Recurrent GI bleed   positive tagged scan for multiple bleeding sites in sigmoid and transverse colon in the past . H&H stable. transfused about 5 units PRBC since admission.  Continue PPI. Appears to be poor surgical  candidate . Surgery following. She would require total abdominal colectomy if conitnues to bleed . Recommend consider another angio and possible embolization if more bleeding, and surgery if not successful. -H&H currently stable -still NPO.  HTN  BP stable. No on any meds    Code Status: full Family Communication: son at bedside Disposition Plan: home once stable   Consultants:  PCCM   [place] surgery  Procedures:  Mesenteric angiogram on 12/22  Antibiotics:  none  HPI/Subjective: No overnight issues. Son at bedside. Feels her dementia has worsened since being in the hospital.  Objective: Filed Vitals:   04/21/12 1637 04/21/12 2200 04/22/12 0507 04/22/12 0900  BP:  152/69 157/83 154/74  Pulse:  100 104 97  Temp: 97.6 F (36.4 C) 98.4 F (36.9 C) 97.8 F (36.6 C) 98 F (36.7 C)  TempSrc: Oral Oral Oral Oral  Resp:  15 16 18   Height:      Weight:      SpO2:  93% 95% 93%    Intake/Output Summary (Last 24 hours) at 04/22/12 1148 Last data filed at 04/21/12 1800  Gross per 24 hour  Intake     70 ml  Output       0 ml  Net     70 ml   Filed Weights   04/19/12 0150 04/20/12 0428  Weight: 67.6 kg (149 lb 0.5 oz) 66.6 kg (146 lb 13.2 oz)    Exam:   General: elderly female in NAD  HEENT: pallor+, moist oral mucosa  Cardiovascular: NS1&S2, no murmurs  Respiratory:clear b/l, no added sounds  Abdomen: soft, NT,ND, BS+  Ext: warm, no edema   CNS: AAOX 2, Non focal  Data Reviewed: Basic Metabolic Panel:  Lab 04/22/12 5284 04/21/12 0430 04/20/12 0200 04/19/12 0158 04/18/12 2011  NA 141 142 139 140 140  K 3.5 3.3* 3.8 3.9 3.7  CL 107 110 109 108 102  CO2 18* 22 25 25 28   GLUCOSE 62* 80 120* 118* 122*  BUN 16 17 16 15 14   CREATININE 0.64 0.78 0.92 0.80 0.86  CALCIUM 8.2* 7.9* 7.5* 8.2* 9.0  MG 1.9 -- -- -- --  PHOS 3.2 -- -- -- --   Liver Function Tests:  Lab 04/18/12 2011  AST 12  ALT 8  ALKPHOS 48  BILITOT 0.1*  PROT 7.4  ALBUMIN 2.8*   No results found for this basename: LIPASE:5,AMYLASE:5 in the last 168 hours No results found for this basename: AMMONIA:5 in the last 168 hours CBC:  Lab 04/22/12 0919 04/21/12 2149 04/21/12 0925 04/21/12 0430 04/21/12 0133 04/19/12 2038 04/19/12 0158 04/18/12 2011  WBC 10.1 11.7* -- 13.3* -- 13.0* 12.0* --  NEUTROABS -- -- -- -- -- -- -- 7.4  HGB 8.3* 7.6* 7.5* 7.8* 7.6* -- -- --  HCT 24.2* 22.3* 21.9* 22.3* 21.6* -- -- --  MCV 86.7 85.8 -- 85.8 -- 83.3 90.5 --  PLT 288 253 -- 196 -- 167 269 --   Cardiac Enzymes: No results found for this basename: CKTOTAL:5,CKMB:5,CKMBINDEX:5,TROPONINI:5 in the last 168 hours BNP (last 3 results)  Basename 07/17/11 1815 05/15/11 0630  PROBNP 1065.0* 1982.0*   CBG: No results found for this basename: GLUCAP:5 in the last 168 hours  Recent Results (from the past 240 hour(s))  MRSA PCR SCREENING     Status: Abnormal   Collection Time   04/19/12  2:13 AM      Component Value Range Status Comment   MRSA by PCR POSITIVE (*) NEGATIVE Final      Studies: No results found.  Scheduled Meds:    . Chlorhexidine Gluconate Cloth  6 each Topical Q0600  . mupirocin ointment  1 application Nasal BID  . pantoprazole (PROTONIX) IV  40 mg Intravenous QHS  . sodium chloride  3 mL Intravenous Q12H   Continuous Infusions:   . sodium chloride 10 mL/hr (04/21/12 1853)      Time spent: 25 minutes    Sarah Perkins  [facility] Pager [phone]. If 8PM-8AM, please contact night-coverage at [email or web address], password [facility] 04/22/2012, 11:48 AM  LOS: 4 days

## 2012-04-23 LAB — CBC
HCT: 24.7 % — ABNORMAL LOW (ref 36.0–46.0)
Hemoglobin: 8.2 g/dL — ABNORMAL LOW (ref 12.0–15.0)
MCV: 88.2 fL (ref 78.0–100.0)
RBC: 2.8 MIL/uL — ABNORMAL LOW (ref 3.87–5.11)
WBC: 9.7 10*3/uL (ref 4.0–10.5)

## 2012-04-23 LAB — HEMOGLOBIN AND HEMATOCRIT, BLOOD: HCT: 25.4 % — ABNORMAL LOW (ref 36.0–46.0)

## 2012-04-23 NOTE — Progress Notes (Signed)
Bright red rectal bleeding  Assessment: Bright red rectal bleeding Appears to be stable without evidence of ongoing bleeding  Plan: Not a candidate for elective surgery. We will see again prn if she rebleeds   Subjective: No abd co, tolerating diet  Objective: Vital signs in last 24 hours: Temp:  [97.8 F (36.6 C)-98.5 F (36.9 C)] 98.5 F (36.9 C) (12/26 1100) Pulse Rate:  [95-111] 99  (12/26 1100) Resp:  [16-18] 17  (12/26 1100) BP: (147-171)/(56-86) 150/75 mmHg (12/26 1100) SpO2:  [93 %-95 %] 94 % (12/26 1100) Weight:  [146 lb 6.2 oz (66.4 kg)] 146 lb 6.2 oz (66.4 kg) (12/25 2121) Last BM Date: 04/21/12  Intake/Output from previous day:   Intake/Output this shift:    General appearance: alert, appears older than stated age and no distress GI: soft, non-tender; bowel sounds normal; no masses,  no organomegaly  Lab Results:  Results for orders placed during the hospital encounter of 04/18/12 (from the past 24 hour(s))  HEMOGLOBIN AND HEMATOCRIT, BLOOD     Status: Abnormal   Collection Time   04/22/12  9:41 PM      Component Value Range   Hemoglobin 7.5 (*) 12.0 - 15.0 g/dL   HCT 16.1 (*) 09.6 - 04.5 %  CBC     Status: Abnormal   Collection Time   04/23/12  5:00 AM      Component Value Range   WBC 9.7  4.0 - 10.5 K/uL   RBC 2.80 (*) 3.87 - 5.11 MIL/uL   Hemoglobin 8.2 (*) 12.0 - 15.0 g/dL   HCT 40.9 (*) 81.1 - 91.4 %   MCV 88.2  78.0 - 100.0 fL   MCH 29.3  26.0 - 34.0 pg   MCHC 33.2  30.0 - 36.0 g/dL   RDW 78.2 (*) 95.6 - 21.3 %   Platelets 323  150 - 400 K/uL     Studies/Results Radiology     MEDS, Scheduled    . mupirocin ointment  1 application Nasal BID  . pantoprazole (PROTONIX) IV  40 mg Intravenous QHS  . sodium chloride  3 mL Intravenous Q12H       LOS: 5 days    Currie Paris, MD, [facility], [place] [phone]   04/23/2012 11:55 AM

## 2012-04-23 NOTE — Progress Notes (Signed)
[facility] PROGRESS NOTE  Sarah Perkins ZOX:[id number] DOB: 1917/10/19 DOA: 04/18/2012 PCP: Thayer Headings, MD  Brief narrative:  78 y/o female with dementia , HTN hx of diverticular bleed in may 2013 ( Tagged scan showed multiple bleeding sites in sigmoid and transverse colon. colonoscopy With pandiverticulosis.managed conservatively) admitted 12/21 with BRBPR for 2 days prior to admit . Admission hemoglobin was 8.1 .  Transferred to ICU for planned resuscitation and embolization.  mesenteric arteriogram this admission without definitive bleeding site identified. No embolization performed.   Assessment/Plan:  Recurrent GI bleed  positive tagged scan for multiple bleeding sites in sigmoid and transverse colon in the past . H&H stable. transfused about 5 units PRBC since admission. Continue PPI. Surgery following and patient is poor surgical candidate .  Marland Kitchen Recommend consider another angio and possible embolization if more bleeding. H&H currently stable and continue to monitor- Resume diet today.  HTN  BP stable. Not on any meds   Code Status: full  Family Communication: son at bedside and updated with plan Disposition Plan: home once stable   Consultants:  PCCM  [place] surgery  Procedures:  Mesenteric angiogram on 12/22 Antibiotics:  none   HPI/Subjective: No overnight issues. Unaware of any bright red bleeding per rectum since yesterday  Objective: Filed Vitals:   04/22/12 1800 04/22/12 2121 04/23/12 0557 04/23/12 1100  BP: 158/70 151/56 147/86 150/75  Pulse: 100 101 111 99  Temp: 98.2 F (36.8 C) 97.8 F (36.6 C) 98.1 F (36.7 C) 98.5 F (36.9 C)  TempSrc: Oral Oral Oral Oral  Resp: 18 18 18 17   Height:      Weight:  66.4 kg (146 lb 6.2 oz)    SpO2: 93% 94% 94% 94%   No intake or output data in the 24 hours ending 04/23/12 1411 Filed Weights   04/19/12 0150 04/20/12 0428 04/22/12 2121  Weight: 67.6 kg (149 lb 0.5 oz) 66.6 kg (146 lb 13.2 oz)  66.4 kg (146 lb 6.2 oz)    Exam: General: elderly female in NAD , appears fatigued HEENT: pallor+, moist oral mucosa  Cardiovascular: NS1&S2, no murmurs  Respiratory:clear b/l, no added sounds  Abdomen: soft, NT,ND, BS+  Ext: warm, no edema  CNS: AAOX 2, Non focal    Data Reviewed: Basic Metabolic Panel:  Lab 04/22/12 8119 04/21/12 0430 04/20/12 0200 04/19/12 0158 04/18/12 2011  NA 141 142 139 140 140  K 3.5 3.3* 3.8 3.9 3.7  CL 107 110 109 108 102  CO2 18* 22 25 25 28   GLUCOSE 62* 80 120* 118* 122*  BUN 16 17 16 15 14   CREATININE 0.64 0.78 0.92 0.80 0.86  CALCIUM 8.2* 7.9* 7.5* 8.2* 9.0  MG 1.9 -- -- -- --  PHOS 3.2 -- -- -- --   Liver Function Tests:  Lab 04/18/12 2011  AST 12  ALT 8  ALKPHOS 48  BILITOT 0.1*  PROT 7.4  ALBUMIN 2.8*   No results found for this basename: LIPASE:5,AMYLASE:5 in the last 168 hours No results found for this basename: AMMONIA:5 in the last 168 hours CBC:  Lab 04/23/12 1146 04/23/12 0500 04/22/12 2141 04/22/12 0919 04/21/12 2149 04/21/12 0430 04/19/12 2038 04/18/12 2011  WBC -- 9.7 -- 10.1 11.7* 13.3* 13.0* --  NEUTROABS -- -- -- -- -- -- -- 7.4  HGB 8.5* 8.2* 7.5* 8.3* 7.6* -- -- --  HCT 25.4* 24.7* 21.6* 24.2* 22.3* -- -- --  MCV -- 88.2 -- 86.7 85.8 85.8 83.3 --  PLT --  323 -- 288 253 196 167 --   Cardiac Enzymes: No results found for this basename: CKTOTAL:5,CKMB:5,CKMBINDEX:5,TROPONINI:5 in the last 168 hours BNP (last 3 results)  Basename 07/17/11 1815 05/15/11 0630  PROBNP 1065.0* 1982.0*   CBG: No results found for this basename: GLUCAP:5 in the last 168 hours  Recent Results (from the past 240 hour(s))  MRSA PCR SCREENING     Status: Abnormal   Collection Time   04/19/12  2:13 AM      Component Value Range Status Comment   MRSA by PCR POSITIVE (*) NEGATIVE Final      Studies: No results found.  Scheduled Meds:   . mupirocin ointment  1 application Nasal BID  . pantoprazole (PROTONIX) IV  40 mg Intravenous  QHS  . sodium chloride  3 mL Intravenous Q12H   Continuous Infusions:   . sodium chloride 10 mL/hr (04/21/12 1853)      Time spent: 25 minutes    Oswin Griffith  [facility] Pager [phone]. If 8PM-8AM, please contact night-coverage at [email or web address], password [email or web address] 04/23/2012, 2:11 PM  LOS: 5 days

## 2012-04-24 LAB — CBC
MCH: 29.5 pg (ref 26.0–34.0)
MCHC: 33.8 g/dL (ref 30.0–36.0)
Platelets: 304 10*3/uL (ref 150–400)
RDW: 15.8 % — ABNORMAL HIGH (ref 11.5–15.5)

## 2012-04-24 NOTE — Progress Notes (Signed)
Noted bloody smear on patient's bed pad.No clots noted.Will continue to monitor. Almer Bushey Joselita,RN

## 2012-04-24 NOTE — Progress Notes (Signed)
[facility] PROGRESS NOTE  Sarah Perkins WUJ:[id number] DOB: 06-23-1917 DOA: 04/18/2012 PCP: Thayer Headings, MD  Brief narrative:  78 y/o female with dementia , HTN hx of diverticular bleed in may 2013 ( Tagged scan showed multiple bleeding sites in sigmoid and transverse colon. colonoscopy With pandiverticulosis.managed conservatively) admitted 12/21 with BRBPR for 2 days prior to admit . Admission hemoglobin was 8.1 .  Transferred to ICU for planned resuscitation and embolization.  mesenteric arteriogram this admission without definitive bleeding site identified. No embolization performed.   Assessment/Plan:  Recurrent GI bleed  positive tagged scan for multiple bleeding sites in sigmoid and transverse colon in the past . H&H stable. transfused about 5 units PRBC since admission. Continue PPI. Surgery following and patient is poor surgical candidate . Marland Kitchen Recommend consider another angio and possible embolization if more bleeding. Some drop in H&H this morning. Nurse noted bloody smear in her bed pad today. No clots. Will continue to monitor.  -son involved in care and aware surgery in not an option. Will need transfusion and conservative management. Not prepared for palliative care approach yet when i brought this up today.   HTN  BP stable. Not on any meds   Code Status: full  Family Communication: son at bedside and updated with plan  Disposition Plan: home once stable . Follow with PT  Consultants:  PCCM  [place] surgery   Procedures:  Mesenteric angiogram on 12/22  Antibiotics:  none  HPI/Subjective:  No overnight issues. Blood smeared bed pad noted this am   Objective: Filed Vitals:   04/23/12 1445 04/23/12 2128 04/24/12 0505 04/24/12 0940  BP: 140/68 132/48 143/62 123/44  Pulse: 100 101 98 93  Temp: 98.9 F (37.2 C) 98.2 F (36.8 C) 98.5 F (36.9 C) 97.6 F (36.4 C)  TempSrc: Oral Oral Oral Oral  Resp: 18 18 18 18   Height:      Weight:  64.4 kg  (141 lb 15.6 oz)    SpO2: 94% 94% 95% 95%    Intake/Output Summary (Last 24 hours) at 04/24/12 1100 Last data filed at 04/24/12 0941  Gross per 24 hour  Intake    610 ml  Output      0 ml  Net    610 ml   Filed Weights   04/20/12 0428 04/22/12 2121 04/23/12 2128  Weight: 66.6 kg (146 lb 13.2 oz) 66.4 kg (146 lb 6.2 oz) 64.4 kg (141 lb 15.6 oz)    Exam:  General: elderly female in NAD , appears fatigued  HEENT: pallor+, moist oral mucosa  Cardiovascular: NS1&S2, no murmurs  Respiratory:clear b/l, no added sounds  Abdomen: soft, NT,ND, BS+  Ext: warm, no edema CNS:  AAOX 2, Non focal   Data Reviewed: Basic Metabolic Panel:  Lab 04/22/12 9562 04/21/12 0430 04/20/12 0200 04/19/12 0158 04/18/12 2011  NA 141 142 139 140 140  K 3.5 3.3* 3.8 3.9 3.7  CL 107 110 109 108 102  CO2 18* 22 25 25 28   GLUCOSE 62* 80 120* 118* 122*  BUN 16 17 16 15 14   CREATININE 0.64 0.78 0.92 0.80 0.86  CALCIUM 8.2* 7.9* 7.5* 8.2* 9.0  MG 1.9 -- -- -- --  PHOS 3.2 -- -- -- --   Liver Function Tests:  Lab 04/18/12 2011  AST 12  ALT 8  ALKPHOS 48  BILITOT 0.1*  PROT 7.4  ALBUMIN 2.8*   No results found for this basename: LIPASE:5,AMYLASE:5 in the last 168 hours No results found for  this basename: AMMONIA:5 in the last 168 hours CBC:  Lab 04/24/12 0530 04/23/12 1146 04/23/12 0500 04/22/12 2141 04/22/12 0919 04/21/12 2149 04/21/12 0430 04/18/12 2011  WBC 9.8 -- 9.7 -- 10.1 11.7* 13.3* --  NEUTROABS -- -- -- -- -- -- -- 7.4  HGB 7.6* 8.5* 8.2* 7.5* 8.3* -- -- --  HCT 22.5* 25.4* 24.7* 21.6* 24.2* -- -- --  MCV 87.2 -- 88.2 -- 86.7 85.8 85.8 --  PLT 304 -- 323 -- 288 253 196 --   Cardiac Enzymes: No results found for this basename: CKTOTAL:5,CKMB:5,CKMBINDEX:5,TROPONINI:5 in the last 168 hours BNP (last 3 results)  Basename 07/17/11 1815 05/15/11 0630  PROBNP 1065.0* 1982.0*   CBG: No results found for this basename: GLUCAP:5 in the last 168 hours  Recent Results (from the past  240 hour(s))  MRSA PCR SCREENING     Status: Abnormal   Collection Time   04/19/12  2:13 AM      Component Value Range Status Comment   MRSA by PCR POSITIVE (*) NEGATIVE Final      Studies: No results found.  Scheduled Meds:   . pantoprazole (PROTONIX) IV  40 mg Intravenous QHS  . sodium chloride  3 mL Intravenous Q12H   Continuous Infusions:   . sodium chloride 10 mL/hr (04/21/12 1853)      Time spent: 25 MINUTES    Sarah Perkins  [facility] Pager [phone]. If 8PM-8AM, please contact night-coverage at [email or web address], password [facility] 04/24/2012, 11:00 AM  LOS: 6 days

## 2012-04-24 NOTE — Evaluation (Signed)
Physical Therapy Evaluation Patient Details Name: Sarah Perkins MRN: [id number] DOB: 02-02-1918 Today's Date: 04/24/2012 Time: 6644-0347 PT Time Calculation (min): 18 min  PT Assessment / Plan / Recommendation Clinical Impression  Pt admitted with rectal bleed leading to anemia and weakness. Pt does not ambulate prior to admission, although with assistance from son is able to transfer in/out of WC and bed. Pt will benefit from skilled PT in the acute care setting in order to maximize functional mobility and increase strength. Pt will benefit from SNF for additional strengthening prior to d/c home    PT Assessment  Patient needs continued PT services    Follow Up Recommendations  SNF;Supervision/Assistance - 24 hour    Does the patient have the potential to tolerate intense rehabilitation      Barriers to Discharge        Equipment Recommendations  None recommended by PT    Recommendations for Other Services     Frequency Min 3X/week    Precautions / Restrictions Precautions Precautions: Fall Restrictions Weight Bearing Restrictions: No   Pertinent Vitals/Pain No complaints of pain      Mobility  Bed Mobility Bed Mobility: Supine to Sit;Sit to Supine;Sitting - Scoot to Edge of Bed Supine to Sit: 2: Max assist;With rails Sitting - Scoot to [place] of Bed: 2: Max assist Sit to Supine: 2: Max assist Details for Bed Mobility Assistance: Max assist through LEs and trunk. Cues for sequencing. Pt able to minimally move LEs off of bed.  Transfers Transfers: Sit to Stand;Stand to Sit Sit to Stand: 1: +2 Total assist;With upper extremity assist;From bed Sit to Stand: Patient Percentage: 40% Stand to Sit: 1: +2 Total assist;With upper extremity assist;To bed Stand to Sit: Patient Percentage: 40% Details for Transfer Assistance: Assist for stability and anterior translation. Cues for upright posture once standing for support Ambulation/Gait Ambulation/Gait Assistance: Not  tested (comment)    Shoulder Instructions     Exercises     PT Diagnosis: Generalized weakness  PT Problem List: Decreased strength;Decreased activity tolerance;Decreased balance;Decreased mobility;Decreased knowledge of use of DME;Decreased safety awareness;Decreased knowledge of precautions PT Treatment Interventions: DME instruction;Gait training;Functional mobility training;Therapeutic activities;Therapeutic exercise;Balance training;Patient/family education   PT Goals Acute Rehab PT Goals PT Goal Formulation: With patient/family Time For Goal Achievement: 05/08/12 Potential to Achieve Goals: Fair Pt will go Supine/Side to Sit: with supervision PT Goal: Supine/Side to Sit - Progress: Goal set today Pt will go Sit to Supine/Side: with supervision PT Goal: Sit to Supine/Side - Progress: Goal set today Pt will go Sit to Stand: with min assist PT Goal: Sit to Stand - Progress: Goal set today Pt will go Stand to Sit: with min assist PT Goal: Stand to Sit - Progress: Goal set today Pt will Transfer Bed to Chair/Chair to Bed: with mod assist PT Transfer Goal: Bed to Chair/Chair to Bed - Progress: Goal set today  Visit Information  Last PT Received On: 04/24/12 Assistance Needed: +2    Subjective Data  Patient Stated Goal: sons stated goal is for her to get stronger   Prior Functioning  Home Living Lives With: Son Available Help at Discharge: Family;Available 24 hours/day Type of Home: House Home Access: Stairs to enter [employer] of Steps: 2 Entrance Stairs-Rails: None Home Layout: One level Bathroom Shower/Tub: Forensic scientist: Standard Bathroom Accessibility: Yes How Accessible: Accessible via walker;Accessible via wheelchair Home Adaptive Equipment: Raised toilet seat with rails;Wheelchair - manual;Grab bars around toilet;Walker - rolling;Bedside commode/3-in-1 Prior Function Level  of Independence: Needs assistance Needs Assistance:  Transfers;Bathing;Dressing;Toileting Bath: Maximal Dressing: Maximal Toileting: Maximal Transfer Assistance: assists during all transfers Able to Take Stairs?: No Driving: No Vocation: Retired Musician: No difficulties Dominant Hand: Right    Cognition  Overall Cognitive Status: History of cognitive impairments - at baseline Arousal/Alertness: Awake/alert Orientation Level: Disoriented to;Situation;Time    Extremity/Trunk Assessment Right Lower Extremity Assessment RLE ROM/Strength/Tone: Deficits RLE ROM/Strength/Tone Deficits: grossly 4/5 Left Lower Extremity Assessment LLE ROM/Strength/Tone: Deficits LLE ROM/Strength/Tone Deficits: grossly 3+/5   Balance Balance Balance Assessed: Yes Static Sitting Balance Static Sitting - Balance Support: Bilateral upper extremity supported;Feet supported Static Sitting - Level of Assistance: 3: Mod assist Static Sitting - Comment/# of Minutes: Pt sitting at EOB for ~3 minutes requiring mod assist for stability as pt with posterior lean throughout.  End of Session PT - End of Session Equipment Utilized During Treatment: Gait belt Activity Tolerance: Patient tolerated treatment well Patient left: in bed;with call bell/phone within reach;with family/visitor present Nurse Communication: Mobility status  GP     Milana Kidney 04/24/2012, 5:36 PM  04/24/2012 Milana Kidney DPT PAGER: [phone] OFFICE: [phone]

## 2012-04-25 LAB — CBC
Platelets: 306 10*3/uL (ref 150–400)
RBC: 2.66 MIL/uL — ABNORMAL LOW (ref 3.87–5.11)
RDW: 16 % — ABNORMAL HIGH (ref 11.5–15.5)
WBC: 6.1 10*3/uL (ref 4.0–10.5)

## 2012-04-25 NOTE — Progress Notes (Signed)
[facility] PROGRESS NOTE  Sarah Perkins FAO:[id number] DOB: [birth year] DOA: 04/18/2012 PCP: Sarah Headings, MD  Brief narrative:  78 y/o female with dementia , HTN hx of diverticular bleed in may 2013 ( Tagged scan showed multiple bleeding sites in sigmoid and transverse colon. colonoscopy With pandiverticulosis.managed conservatively) admitted 12/21 with BRBPR for 2 days prior to admit . Admission hemoglobin was 8.1 .  Transferred to ICU for planned resuscitation and embolization.  mesenteric arteriogram this admission without definitive bleeding site identified. No embolization performed.   Assessment/Plan:  Recurrent GI bleed  positive tagged scan for multiple bleeding sites in sigmoid and transverse colon in the past . H&H stable. transfused about 5 units PRBC since admission. Continue PPI. Surgery following and patient is poor surgical candidate .  Marland Kitchen Recommend consider another angio and possible embolization if more bleeding. -H&H stable this am, follow recheck in am -tolerating po's, if no further rectal bleeding and hgb stable will plan d/Perkins in am  HTN  BP stable. Not on any meds   Code Status: full  Family Communication: no family available Disposition Plan: home once stable   Consultants:  PCCM  [place] surgery  Procedures:  Mesenteric angiogram on 12/22 Antibiotics:  none   HPI/Subjective: Tolerating po well, no further BM overnight and no further rectal bleeding noted  Objective: Filed Vitals:   04/24/12 1439 04/24/12 2143 04/25/12 0539 04/25/12 0954  BP: 133/52 126/49 142/56 131/47  Pulse: 99 94 87 90  Temp: 97.4 F (36.3 Perkins) 98.3 F (36.8 Perkins) 97.8 F (36.6 Perkins) 97.8 F (36.6 Perkins)  TempSrc: Oral Oral Oral Oral  Resp: 20 18 18 20   Height:      Weight:  63.5 kg (139 lb 15.9 oz)    SpO2: 96% 92% 95% 95%    Intake/Output Summary (Last 24 hours) at 04/25/12 1154 Last data filed at 04/24/12 1800  Gross per 24 hour  Intake    480 ml  Output       0 ml  Net    480 ml   Filed Weights   04/22/12 2121 04/23/12 2128 04/24/12 2143  Weight: 66.4 kg (146 lb 6.2 oz) 64.4 kg (141 lb 15.6 oz) 63.5 kg (139 lb 15.9 oz)    Exam: General: elderly female in NAD, alert and appropriate HEENT: pallor+, moist oral mucosa  Cardiovascular: NS1&S2, no murmurs  Respiratory:clear b/l, no cracles or wheezes  Abdomen: soft, NT,ND, BS+  Ext: warm, no edema  CNS: AAOX 2, Non focal    Data Reviewed: Basic Metabolic Panel:  Lab 04/22/12 6962 04/21/12 0430 04/20/12 0200 04/19/12 0158 04/18/12 2011  NA 141 142 139 140 140  K 3.5 3.3* 3.8 3.9 3.7  CL 107 110 109 108 102  CO2 18* 22 25 25 28   GLUCOSE 62* 80 120* 118* 122*  BUN 16 17 16 15 14   CREATININE 0.64 0.78 0.92 0.80 0.86  CALCIUM 8.2* 7.9* 7.5* 8.2* 9.0  MG 1.9 -- -- -- --  PHOS 3.2 -- -- -- --   Liver Function Tests:  Lab 04/18/12 2011  AST 12  ALT 8  ALKPHOS 48  BILITOT 0.1*  PROT 7.4  ALBUMIN 2.8*   No results found for this basename: LIPASE:5,AMYLASE:5 in the last 168 hours No results found for this basename: AMMONIA:5 in the last 168 hours CBC:  Lab 04/25/12 0610 04/24/12 0530 04/23/12 1146 04/23/12 0500 04/22/12 2141 04/22/12 0919 04/21/12 2149 04/18/12 2011  WBC 6.1 9.8 -- 9.7 -- 10.1 11.7* --  NEUTROABS -- -- -- -- -- -- -- 7.4  HGB 7.9* 7.6* 8.5* 8.2* 7.5* -- -- --  HCT 23.3* 22.5* 25.4* 24.7* 21.6* -- -- --  MCV 87.6 87.2 -- 88.2 -- 86.7 85.8 --  PLT 306 304 -- 323 -- 288 253 --   Cardiac Enzymes: No results found for this basename: CKTOTAL:5,CKMB:5,CKMBINDEX:5,TROPONINI:5 in the last 168 hours BNP (last 3 results)  Basename 07/17/11 1815 05/15/11 0630  PROBNP 1065.0* 1982.0*   CBG: No results found for this basename: GLUCAP:5 in the last 168 hours  Recent Results (from the past 240 hour(s))  MRSA PCR SCREENING     Status: Abnormal   Collection Time   04/19/12  2:13 AM      Component Value Range Status Comment   MRSA by PCR POSITIVE (*) NEGATIVE Final        Studies: No results found.  Scheduled Meds:    . pantoprazole (PROTONIX) IV  40 mg Intravenous QHS  . sodium chloride  3 mL Intravenous Q12H   Continuous Infusions:    . sodium chloride 10 mL/hr (04/21/12 1853)      Time spent: 25 minutes    Sarah Perkins  [facility] Pager [phone]. If 8PM-8AM, please contact night-coverage at [email or web address], password [facility] 04/25/2012, 11:54 AM  LOS: 7 days

## 2012-04-26 LAB — BASIC METABOLIC PANEL
BUN: 10 mg/dL (ref 6–23)
Chloride: 110 mEq/L (ref 96–112)
GFR calc Af Amer: 89 mL/min — ABNORMAL LOW (ref >90)
GFR calc non Af Amer: 77 mL/min — ABNORMAL LOW (ref >90)
Glucose, Bld: 104 mg/dL — ABNORMAL HIGH (ref 70–99)
Potassium: 2.9 mEq/L — ABNORMAL LOW (ref 3.5–5.1)
Sodium: 144 mEq/L (ref 135–145)

## 2012-04-26 LAB — CBC
HCT: 21.4 % — ABNORMAL LOW (ref 36.0–46.0)
Hemoglobin: 7.1 g/dL — ABNORMAL LOW (ref 12.0–15.0)
WBC: 6.1 10*3/uL (ref 4.0–10.5)

## 2012-04-26 MED ORDER — POTASSIUM CHLORIDE CRYS ER 20 MEQ PO TBCR
40.0000 meq | EXTENDED_RELEASE_TABLET | ORAL | Status: AC
  Administered 2012-04-26 – 2012-04-27 (×3): 40 meq via ORAL
  Filled 2012-04-26 (×3): qty 2

## 2012-04-26 NOTE — Progress Notes (Signed)
[facility] PROGRESS NOTE  MARSHAL SCHRECENGOST ZOX:[id number] DOB: 07-24-1917 DOA: 04/18/2012 PCP: Thayer Headings, MD  Brief narrative:  78 y/o female with dementia , HTN hx of diverticular bleed in may 2013 ( Tagged scan showed multiple bleeding sites in sigmoid and transverse colon. colonoscopy With pandiverticulosis.managed conservatively) admitted 12/21 with BRBPR for 2 days prior to admit . Admission hemoglobin was 8.1 .  Transferred to ICU for planned resuscitation and embolization.  mesenteric arteriogram this admission without definitive bleeding site identified. No embolization performed.   Assessment/Plan:  Recurrent GI bleed  positive tagged scan for multiple bleeding sites in sigmoid and transverse colon in the past . H&H stable. transfused about 5 units PRBC since admission. Continue PPI. Surgery following and patient is poor surgical candidate .  Marland Kitchen Recommend consider another angio and possible embolization if more bleeding. -HGB down to 7.1 this am, and ?melanotic stool- will transfuse 1u PRBC -Follow and recheck, continuing conservative management as pt is not a surgical candidate -tolerating po's  HTN  BP stable. Not on any meds   Hypokalemia -replace k  Code Status: full  Family Communication: son at bedside Disposition Plan: home once stable   Consultants:  PCCM  [place] surgery  Procedures:  Mesenteric angiogram on 12/22 Antibiotics:  none   HPI/Subjective: Per nursing black stool this am  Objective: Filed Vitals:   04/25/12 1910 04/25/12 2048 04/26/12 0535 04/26/12 1028  BP: 143/50 131/54 140/59 128/46  Pulse: 96 93 96 81  Temp: 98.1 F (36.7 C) 98.1 F (36.7 C) 98.4 F (36.9 C) 97.7 F (36.5 C)  TempSrc: Oral Oral Oral Oral  Resp: 20 20 20 18   Height:      Weight:  62.9 kg (138 lb 10.7 oz)    SpO2: 96% 94% 96% 98%   No intake or output data in the 24 hours ending 04/26/12 1300 Filed Weights   04/23/12 2128 04/24/12 2143 04/25/12  2048  Weight: 64.4 kg (141 lb 15.6 oz) 63.5 kg (139 lb 15.9 oz) 62.9 kg (138 lb 10.7 oz)    Exam: General: elderly female in NAD, alert and appropriate HEENT: pallor+, moist oral mucosa  Cardiovascular: NS1&S2, no murmurs  Respiratory:clear b/l, no cracles or wheezes  Abdomen: soft, NT,ND, BS+  Ext: warm, no edema  CNS: AAOX 2, Non focal    Data Reviewed: Basic Metabolic Panel:  Lab 04/26/12 8119 04/22/12 0500 04/21/12 0430 04/20/12 0200  NA 144 141 142 139  K 2.9* 3.5 3.3* 3.8  CL 110 107 110 109  CO2 26 18* 22 25  GLUCOSE 104* 62* 80 120*  BUN 10 16 17 16   CREATININE 0.60 0.64 0.78 0.92  CALCIUM 8.3* 8.2* 7.9* 7.5*  MG -- 1.9 -- --  PHOS -- 3.2 -- --   Liver Function Tests: No results found for this basename: AST:5,ALT:5,ALKPHOS:5,BILITOT:5,PROT:5,ALBUMIN:5 in the last 168 hours No results found for this basename: LIPASE:5,AMYLASE:5 in the last 168 hours No results found for this basename: AMMONIA:5 in the last 168 hours CBC:  Lab 04/26/12 0700 04/25/12 0610 04/24/12 0530 04/23/12 1146 04/23/12 0500 04/22/12 0919  WBC 6.1 6.1 9.8 -- 9.7 10.1  NEUTROABS -- -- -- -- -- --  HGB 7.1* 7.9* 7.6* 8.5* 8.2* --  HCT 21.4* 23.3* 22.5* 25.4* 24.7* --  MCV 89.2 87.6 87.2 -- 88.2 86.7  PLT 302 306 304 -- 323 288   Cardiac Enzymes: No results found for this basename: CKTOTAL:5,CKMB:5,CKMBINDEX:5,TROPONINI:5 in the last 168 hours BNP (last 3 results)  Basename 07/17/11 1815 05/15/11 0630  PROBNP 1065.0* 1982.0*   CBG: No results found for this basename: GLUCAP:5 in the last 168 hours  Recent Results (from the past 240 hour(s))  MRSA PCR SCREENING     Status: Abnormal   Collection Time   04/19/12  2:13 AM      Component Value Range Status Comment   MRSA by PCR POSITIVE (*) NEGATIVE Final      Studies: No results found.  Scheduled Meds:    . pantoprazole (PROTONIX) IV  40 mg Intravenous QHS  . potassium chloride  40 mEq Oral Q4H  . sodium chloride  3 mL  Intravenous Q12H   Continuous Infusions:    . sodium chloride 10 mL/hr (04/21/12 1853)      Time spent: 35 minutes    Endi Lagman C  [facility] Pager [phone]. If 8PM-8AM, please contact night-coverage at [email or web address], password [facility] 04/26/2012, 1:00 PM  LOS: 8 days

## 2012-04-27 LAB — TYPE AND SCREEN
ABO/RH(D): B POS
Antibody Screen: NEGATIVE
Unit division: 0

## 2012-04-27 LAB — CBC
HCT: 27.8 % — ABNORMAL LOW (ref 36.0–46.0)
Hemoglobin: 9.2 g/dL — ABNORMAL LOW (ref 12.0–15.0)
MCV: 89.4 fL (ref 78.0–100.0)
RDW: 16.1 % — ABNORMAL HIGH (ref 11.5–15.5)
WBC: 7.6 10*3/uL (ref 4.0–10.5)

## 2012-04-27 LAB — BASIC METABOLIC PANEL
BUN: 10 mg/dL (ref 6–23)
CO2: 25 mEq/L (ref 19–32)
Chloride: 110 mEq/L (ref 96–112)
Creatinine, Ser: 0.58 mg/dL (ref 0.50–1.10)
Glucose, Bld: 104 mg/dL — ABNORMAL HIGH (ref 70–99)
Potassium: 4 mEq/L (ref 3.5–5.1)

## 2012-04-27 NOTE — Progress Notes (Signed)
[facility] PROGRESS NOTE  Sarah Perkins ZOX:[id number] DOB: Sep 09, 1917 DOA: 04/18/2012 PCP: Thayer Headings, MD  Brief narrative:  78 y/o female with dementia , HTN hx of diverticular bleed in may 2013 ( Tagged scan showed multiple bleeding sites in sigmoid and transverse colon. colonoscopy With pandiverticulosis.managed conservatively) admitted 12/21 with BRBPR for 2 days prior to admit . Admission hemoglobin was 8.1 .  Transferred to ICU for planned resuscitation and embolization.  mesenteric arteriogram this admission without definitive bleeding site identified. No embolization performed.   Assessment/Plan:  Recurrent GI bleed  positive tagged scan for multiple bleeding sites in sigmoid and transverse colon in the past . H&H stable. transfused about 5 units PRBC since admission. Continue PPI. Surgery following and patient is poor surgical candidate .  Marland Kitchen Recommend consider another angio and possible embolization if more bleeding. -HGB down to 7.1 this am, and ?melanotic stool- transfused 1u PRBC 12/30 -Hemoglobin improved to 9.2,Follow and recheck, continuing conservative management as pt is not a surgical candidate -tolerating po's  HTN  BP stable. Not on any meds   Hypokalemia -resolved  Code Status: full  Family Communication: son at bedside Disposition Plan: snf once stable   Consultants:  PCCM  [place] surgery  Procedures:  Mesenteric angiogram on 12/22 Antibiotics:  none   HPI/Subjective: No further BMs/bleeding reported.  Objective: Filed Vitals:   04/26/12 2048 04/27/12 0457 04/27/12 0800 04/27/12 1400  BP: 158/64 144/74 142/71 132/45  Pulse: 92 86 90 86  Temp: 99 F (37.2 C) 98.1 F (36.7 C) 98.2 F (36.8 C) 98.3 F (36.8 C)  TempSrc: Oral Oral    Resp: 16 16 18 16   Height:      Weight: 63 kg (138 lb 14.2 oz)     SpO2: 95% 95% 97% 97%   No intake or output data in the 24 hours ending 04/27/12 1724 Filed Weights   04/24/12 2143  04/25/12 2048 04/26/12 2048  Weight: 63.5 kg (139 lb 15.9 oz) 62.9 kg (138 lb 10.7 oz) 63 kg (138 lb 14.2 oz)    Exam: General: elderly female in NAD, alert and appropriate HEENT: pallor+, moist oral mucosa  Cardiovascular: NS1&S2, no murmurs  Respiratory:clear b/l, no crackles or wheezes  Abdomen: soft, NT,ND, BS+  Ext: warm, no edema  CNS: AAOX 2, Non focal    Data Reviewed: Basic Metabolic Panel:  Lab 04/27/12 8119 04/26/12 0700 04/22/12 0500 04/21/12 0430  NA 143 144 141 142  K 4.0 2.9* 3.5 3.3*  CL 110 110 107 110  CO2 25 26 18* 22  GLUCOSE 104* 104* 62* 80  BUN 10 10 16 17   CREATININE 0.58 0.60 0.64 0.78  CALCIUM 8.3* 8.3* 8.2* 7.9*  MG -- -- 1.9 --  PHOS -- -- 3.2 --   Liver Function Tests: No results found for this basename: AST:5,ALT:5,ALKPHOS:5,BILITOT:5,PROT:5,ALBUMIN:5 in the last 168 hours No results found for this basename: LIPASE:5,AMYLASE:5 in the last 168 hours No results found for this basename: AMMONIA:5 in the last 168 hours CBC:  Lab 04/27/12 0815 04/26/12 1920 04/26/12 0700 04/25/12 0610 04/24/12 0530 04/23/12 0500  WBC 7.6 -- 6.1 6.1 9.8 9.7  NEUTROABS -- -- -- -- -- --  HGB 9.2* 9.4* 7.1* 7.9* 7.6* --  HCT 27.8* 27.9* 21.4* 23.3* 22.5* --  MCV 89.4 -- 89.2 87.6 87.2 88.2  PLT 331 -- 302 306 304 323   Cardiac Enzymes: No results found for this basename: CKTOTAL:5,CKMB:5,CKMBINDEX:5,TROPONINI:5 in the last 168 hours BNP (last 3 results)  Basename 07/17/11 1815 05/15/11 0630  PROBNP 1065.0* 1982.0*   CBG: No results found for this basename: GLUCAP:5 in the last 168 hours  Recent Results (from the past 240 hour(s))  MRSA PCR SCREENING     Status: Abnormal   Collection Time   04/19/12  2:13 AM      Component Value Range Status Comment   MRSA by PCR POSITIVE (*) NEGATIVE Final      Studies: No results found.  Scheduled Meds:    . pantoprazole (PROTONIX) IV  40 mg Intravenous QHS  . sodium chloride  3 mL Intravenous Q12H    Continuous Infusions:    . sodium chloride 10 mL/hr (04/21/12 1853)      Time spent: 30 minutes    Laykin Rainone C  [facility] Pager [phone]. If 8PM-8AM, please contact night-coverage at [email or web address], password [facility] 04/27/2012, 5:24 PM  LOS: 9 days

## 2012-04-27 NOTE — Clinical Social Work Note (Signed)
Clinical Social Work Department CLINICAL SOCIAL WORK PLACEMENT NOTE 04/27/2012  Patient:  VAANI, MORREN  Account Number:  [id number] Admit date:  04/18/2012  Clinical Social Worker:  Johnsie Cancel  Date/time:  04/27/2012 03:04 PM  Clinical Social Work is seeking post-discharge placement for this patient at the following level of care:   SKILLED NURSING   (*CSW will update this form in Epic as items are completed)   04/27/2012  Patient/family provided with [facility] Department of Clinical Social Work's list of facilities offering this level of care within the geographic area requested by the patient (or if unable, by the patient's family).  04/27/2012  Patient/family informed of their freedom to choose among providers that offer the needed level of care, that participate in Medicare, Medicaid or managed care program needed by the patient, have an available bed and are willing to accept the patient.  04/27/2012  Patient/family informed of [facility]' ownership interest in [facility], as well as of the fact that they are under no obligation to receive care at this facility.  PASARR submitted to EDS on  PASARR number received from EDS on   FL2 transmitted to all facilities in geographic area requested by pt/family on  04/27/2012 FL2 transmitted to all facilities within larger geographic area on   Patient informed that his/her managed care company has contracts with or will negotiate with  certain facilities, including the following:     Patient/family informed of bed offers received:   Patient chooses bed at  Physician recommends and patient chooses bed at    Patient to be transferred to  on   Patient to be transferred to facility by   The following physician request were entered in Epic:   Additional Comments:  Lia Foyer, LCSWA [facility] Clinical Social Worker Contact #: [phone] (PRN)

## 2012-04-27 NOTE — Progress Notes (Signed)
Physical Therapy Treatment Patient Details Name: Sarah Perkins MRN: [id number] DOB: 16-Jun-1917 Today's Date: 04/27/2012 Time: 7829-5621 PT Time Calculation (min): 23 min  PT Assessment / Plan / Recommendation Comments on Treatment Session  Patient motivated to progress.  Balance in sitting and standing improved through session.  Agree with need for SNF for continued therapy.    Follow Up Recommendations  SNF;Supervision/Assistance - 24 hour     Does the patient have the potential to tolerate intense rehabilitation     Barriers to Discharge        Equipment Recommendations  None recommended by PT    Recommendations for Other Services    Frequency Min 3X/week   Plan Discharge plan remains appropriate;Frequency remains appropriate    Precautions / Restrictions Precautions Precautions: Fall Restrictions Weight Bearing Restrictions: No   Pertinent Vitals/Pain     Mobility  Bed Mobility Bed Mobility: Rolling Right;Right Sidelying to Sit;Sitting - Scoot to Edge of Bed;Sit to Sidelying Right Rolling Right: 2: Max assist;With rail Right Sidelying to Sit: 2: Max assist;HOB flat;With rails Sitting - Scoot to Edge of Bed: 2: Max assist (using bed pads) Sit to Sidelying Right: 2: Max assist;With rail;HOB flat Details for Bed Mobility Assistance: Verbal and tactile cues for all mobility.  Used bil UE's to assist with moving to sitting.  Assist to bring LE's onto bed to return to right sidelying. Transfers Transfers: Sit to Stand;Stand to Sit Sit to Stand: 1: +1 Total assist;With upper extremity assist;From bed Stand to Sit: 1: +1 Total assist;With upper extremity assist;To bed Details for Transfer Assistance: PT sitting in front of patient, providing support to stand using bed pads.  Patient using bed rail and PT's shoulders for support.  Verbal and tactile cues to move hips forward and extend hips/knees for more upright posture.  Patient stood x2 for 2 minutes each time,  working on upright posture and strengthening trunk/LE's. Ambulation/Gait Ambulation/Gait Assistance: Not tested (comment)      PT Goals Acute Rehab PT Goals PT Goal: Supine/Side to Sit - Progress: Progressing toward goal PT Goal: Sit to Supine/Side - Progress: Progressing toward goal PT Goal: Sit to Stand - Progress: Progressing toward goal PT Goal: Stand to Sit - Progress: Progressing toward goal PT Transfer Goal: Bed to Chair/Chair to Bed - Progress: Progressing toward goal  Visit Information  Last PT Received On: 04/27/12 Assistance Needed: +2    Subjective Data  Subjective: "I'll try"   Cognition  Overall Cognitive Status: History of cognitive impairments - at baseline Arousal/Alertness: Awake/alert Orientation Level: Disoriented to;Situation;Time Behavior During Session: [facility] for tasks performed    Balance  Balance Balance Assessed: Yes Static Sitting Balance Static Sitting - Balance Support: Right upper extremity supported;Feet supported Static Sitting - Level of Assistance: 5: Stand by assistance Static Sitting - Comment/# of Minutes: Patient able to sit with standby assist x 3 minutes.  Verbal cues to look up.  Tactile cues for anterior tilt of pelvis and trunk extension. Dynamic Sitting Balance Dynamic Sitting - Balance Support: Feet supported;Right upper extremity supported;Left upper extremity supported Dynamic Sitting - Level of Assistance: 5: Stand by assistance Dynamic Sitting - Balance Activities: Forward lean/weight shifting;Reaching for objects;Reaching across midline Dynamic Sitting - Comments: Patient able to lift one arm to reach for objects on same side and across midline.  Standby assist for safety.  Completed activities x 2 minutes. Static Standing Balance Static Standing - Balance Support: Bilateral upper extremity supported Static Standing - Level of Assistance: 2: Max  assist Static Standing - Comment/# of Minutes: Patient stood x 2 for 2 minutes each  time.  Required assist at hips to remain upright.  Verbal cues for hip and knee extension.  Was able to maintain balance on her own x 15 seconds.  End of Session PT - End of Session Equipment Utilized During Treatment: Gait belt Activity Tolerance: Patient limited by fatigue Patient left: in bed;with call bell/phone within reach;with family/visitor present Nurse Communication: Mobility status   GP     Vena Austria 04/27/2012, 6:58 PM Durenda Hurt. Renaldo Fiddler, [facility] Acute Rehab Services Pager [phone]

## 2012-04-28 DIAGNOSIS — K219 Gastro-esophageal reflux disease without esophagitis: Secondary | ICD-10-CM | POA: Diagnosis not present

## 2012-04-28 DIAGNOSIS — D509 Iron deficiency anemia, unspecified: Secondary | ICD-10-CM | POA: Diagnosis not present

## 2012-04-28 DIAGNOSIS — F0390 Unspecified dementia without behavioral disturbance: Secondary | ICD-10-CM | POA: Diagnosis not present

## 2012-04-28 DIAGNOSIS — K625 Hemorrhage of anus and rectum: Secondary | ICD-10-CM | POA: Diagnosis not present

## 2012-04-28 DIAGNOSIS — I150 Renovascular hypertension: Secondary | ICD-10-CM | POA: Diagnosis not present

## 2012-04-28 DIAGNOSIS — R609 Edema, unspecified: Secondary | ICD-10-CM | POA: Diagnosis not present

## 2012-04-28 DIAGNOSIS — D649 Anemia, unspecified: Secondary | ICD-10-CM | POA: Diagnosis not present

## 2012-04-28 DIAGNOSIS — I509 Heart failure, unspecified: Secondary | ICD-10-CM | POA: Diagnosis not present

## 2012-04-28 DIAGNOSIS — K922 Gastrointestinal hemorrhage, unspecified: Secondary | ICD-10-CM | POA: Diagnosis not present

## 2012-04-28 DIAGNOSIS — I10 Essential (primary) hypertension: Secondary | ICD-10-CM | POA: Diagnosis not present

## 2012-04-28 DIAGNOSIS — N189 Chronic kidney disease, unspecified: Secondary | ICD-10-CM | POA: Diagnosis not present

## 2012-04-28 DIAGNOSIS — Z86718 Personal history of other venous thrombosis and embolism: Secondary | ICD-10-CM | POA: Diagnosis not present

## 2012-04-28 DIAGNOSIS — K5900 Constipation, unspecified: Secondary | ICD-10-CM | POA: Diagnosis not present

## 2012-04-28 DIAGNOSIS — D62 Acute posthemorrhagic anemia: Secondary | ICD-10-CM | POA: Diagnosis not present

## 2012-04-28 DIAGNOSIS — N181 Chronic kidney disease, stage 1: Secondary | ICD-10-CM | POA: Diagnosis not present

## 2012-04-28 DIAGNOSIS — I5033 Acute on chronic diastolic (congestive) heart failure: Secondary | ICD-10-CM | POA: Diagnosis not present

## 2012-04-28 DIAGNOSIS — M6281 Muscle weakness (generalized): Secondary | ICD-10-CM | POA: Diagnosis not present

## 2012-04-28 LAB — CBC
HCT: 26.9 % — ABNORMAL LOW (ref 36.0–46.0)
Hemoglobin: 8.7 g/dL — ABNORMAL LOW (ref 12.0–15.0)
MCH: 29.2 pg (ref 26.0–34.0)
MCV: 90.3 fL (ref 78.0–100.0)
Platelets: 363 10*3/uL (ref 150–400)
RBC: 2.98 MIL/uL — ABNORMAL LOW (ref 3.87–5.11)
WBC: 6.5 10*3/uL (ref 4.0–10.5)

## 2012-04-28 MED ORDER — FERROUS GLUCONATE 324 (38 FE) MG PO TABS: 324.0000 mg | ORAL_TABLET | Freq: Two times a day (BID) | ORAL | Status: DC

## 2012-04-28 MED ORDER — OMEPRAZOLE 20 MG PO CPDR: 40.0000 mg | DELAYED_RELEASE_CAPSULE | Freq: Every day | ORAL | Status: DC | PRN

## 2012-04-28 NOTE — Progress Notes (Signed)
Physical Therapy Treatment Patient Details Name: Sarah Perkins MRN: [id number] DOB: [birth year] Today's Date: 04/28/2012 Time: 4098-1191 PT Time Calculation (min): 24 min  PT Assessment / Plan / Recommendation Comments on Treatment Session  Pt admitted with recal bleed and continues to progress with therapy. Pt motivated and able to tolerate OOB to chair with several sit to stand trials this am.    Follow Up Recommendations  SNF;Supervision/Assistance - 24 hour     Does the patient have the potential to tolerate intense rehabilitation     Barriers to Discharge        Equipment Recommendations  None recommended by PT    Recommendations for Other Services    Frequency Min 3X/week   Plan Discharge plan remains appropriate;Frequency remains appropriate    Precautions / Restrictions Precautions Precautions: Fall Restrictions Weight Bearing Restrictions: No   Pertinent Vitals/Pain None    Mobility  Bed Mobility Bed Mobility: Supine to Sit Supine to Sit: 1: +2 Total assist;HOB flat Supine to Sit: Patient Percentage: 40% Details for Bed Mobility Assistance: Assist for trunk to translate anterior and for bilateral LEs to move to EOB. Cues for sequence. Transfers Transfers: Sit to Stand;Stand Pivot Transfers;Stand to Sit (5 trials for sit to stand.) Sit to Stand: 1: +2 Total assist;With upper extremity assist;From bed;From chair/3-in-1 Sit to Stand: Patient Percentage: 50% Stand to Sit: 1: +2 Total assist;With upper extremity assist;To bed;To chair/3-in-1 Stand to Sit: Patient Percentage: 50% Stand Pivot Transfers: 1: +2 Total assist Stand Pivot Transfers: Patient Percentage: 50% Details for Transfer Assistance: Assist for trunk to translate anterior over BOS as well as for balance. Blocked bilateral knees to prevent buckling. Cues for sequence and tall posture. Ambulation/Gait Ambulation/Gait Assistance: Not tested (comment) Stairs: No Wheelchair Mobility Wheelchair  Mobility: No    Exercises     PT Diagnosis:    PT Problem List:   PT Treatment Interventions:     PT Goals Acute Rehab PT Goals PT Goal Formulation: With patient/family Time For Goal Achievement: 05/08/12 Potential to Achieve Goals: Fair PT Goal: Supine/Side to Sit - Progress: Progressing toward goal PT Goal: Sit to Supine/Side - Progress: Progressing toward goal PT Goal: Sit to Stand - Progress: Progressing toward goal PT Goal: Stand to Sit - Progress: Progressing toward goal PT Transfer Goal: Bed to Chair/Chair to Bed - Progress: Progressing toward goal  Visit Information  Last PT Received On: 04/28/12 Assistance Needed: +2    Subjective Data  Subjective: "I want to move as much as I can." Patient Stated Goal: sons stated goal is for her to get stronger   Cognition  Overall Cognitive Status: History of cognitive impairments - at baseline Arousal/Alertness: Awake/alert Orientation Level: Disoriented to;Situation;Time Behavior During Session: WFL for tasks performed    Balance  Balance Balance Assessed: Yes Static Sitting Balance Static Sitting - Balance Support: Bilateral upper extremity supported;Feet supported Static Sitting - Level of Assistance: 5: Stand by assistance Static Sitting - Comment/# of Minutes: Sat EOB for 5 minutes with supervision for safety.  End of Session PT - End of Session Equipment Utilized During Treatment: Gait belt Activity Tolerance: Patient tolerated treatment well Patient left: in chair;with call bell/phone within reach;with chair alarm set;with family/visitor present Nurse Communication: Mobility status   GP     Cephus Shelling 04/28/2012, 9:20 AM  04/28/2012 Cephus Shelling, PT, DPT [phone]

## 2012-04-28 NOTE — Discharge Summary (Signed)
Physician Discharge Summary  Sarah Perkins RUE:[id number] DOB: [birth year] DOA: 04/18/2012  PCP: Sarah Headings, MD  Admit date: 04/18/2012 Discharge date: 04/28/2012  Time spent: >30 minutes  Recommendations for Outpatient Follow-up:      Follow-up Information    Please follow up. (SNF MD in 1-2DAYS)        Followup with Dr. Charna Perkins IN 1-2WEEKS, CALL FOR APPT UPON DISCHARGE  Discharge Diagnoses:  Principal Problem:  *Bright red rectal bleeding Active Problems:  HTN (hypertension)  CKD (chronic kidney disease), stage I  Diverticulosis  Anemia  history of CHF  Discharge Condition: improved/stable  Diet recommendation: regular, high fiber  Filed Weights   04/24/12 2143 04/25/12 2048 04/26/12 2048  Weight: 63.5 kg (139 lb 15.9 oz) 62.9 kg (138 lb 10.7 oz) 63 kg (138 lb 14.2 oz)    History of present illness:  Pt is a 78 yo female h/o dementia bed bound taken care of by her son at home comes in after he noted some brbpr that has worsened over the last 2 days. 2 days ago she had just a small amt of blood with her stool, then none yesterday. Then today she had another bm which had more blood in it than 2 days ago. Pt has been acting like herself. No n/v/d/or fevers. No complaints of abd pain. She is on a baby asa a day. She had a diverticular bleed in may of this year that resolved with conservative management. edp rectal exam noted maroonish stool heme positive.She was admitted for further evaluation and management.   Hospital Course:  78 y/o female with dementia , HTN hx of diverticular bleed in may 2013 ( Tagged scan showed multiple bleeding sites in sigmoid and transverse colon. colonoscopy With pandiverticulosis.managed conservatively) admitted 12/21 with BRBPR for 2 days prior to admit . Admission hemoglobin was 8.1. It was noted that she had had a previous colonoscopy which showed pandiverticulosis. On 12/22 she had a nuclear medicine scan which showed early  uptake in the descending colon with rapid transit to the sigmoid colon compatible with an active colonic hemorrhage within the ascending colon and she was transferred to ICU for planned resuscitation and embolization. Mesenteric arteriogram was then done but without definitive bleeding site identified -so No embolization performed.   Recurrent GI bleed  As discussed above, upon admission she transfused  PRBC and to date has been transfused a total of 6 units since admission. Surgery was consulted followed patient and stated that she is a poor surgical candidate, and recommended conservative management. She was last transfused on 1u PRBC on12/29. Surgery stated that they would see again and consider another angio and possible embolization if more bleeding.  Sarah Perkins been monitored in the hospital, and did not have any more active bleeding-her last stool was yesterday 12/30 documented as brown no gross bleeding. Her hemoglobin is stable today at 8.7 she is to followup outpatient with nursing home M.D. have recheck hemoglobin in 2 days. She is to followup with Dr. Loreta Perkins in one to 2 weeks. She is to avoid all anti-inflammatories including aspirin. Dr. Loreta Perkins to further direct  as to when she can resume the 81 mg aspirin. -HTN  BP stable. She is to continue her Norvasc up discharge. Hypokalemia  -resolved, potassium was repleted in the hospital. Debility -PT was consulted and they saw the patient and recommended skilled nursing. History of CHF -Remains compensated in the hospital, her Lasix was held secondary to #1. nursing home M.D. to monitor  fluid status closely and resume her Lasix outpt when clinically appropriate. Consultants:  PCCM   surgery  Procedures:  Mesenteric angiogram on 12/22  Discharge Exam: Filed Vitals:   04/27/12 2132 04/28/12 0508 04/28/12 0945 04/28/12 1339  BP: 140/66 161/71  127/51  Pulse: 84 83 87 85  Temp: 99 F (37.2 C) 98.5 F (36.9 C) 97.7 F (36.5 C) 97.8 F  (36.6 C)  TempSrc: Oral Oral Oral Oral  Resp: 16 16 16 18   Height:      Weight:      SpO2: 96% 93% 93% 98%   Exam:  General: elderly female in NAD, alert and appropriate  HEENT: pallor+, moist oral mucosa  Cardiovascular: NS1&S2, no murmurs  Respiratory:clear b/l, no crackles or wheezes  Abdomen: soft, NT,ND, BS+  Ext: warm, no edema  CNS: AAOX 2, Non focal   Discharge Instructions  Discharge Orders    Future Orders Please Complete By Expires   Diet general      Increase activity slowly          Medication List     As of 04/28/2012  2:03 PM    STOP taking these medications         aspirin 81 MG chewable tablet      ferrous sulfate 325 (65 FE) MG tablet      furosemide 20 MG tablet   Commonly known as: LASIX      naproxen sodium 220 MG tablet   Commonly known as: ANAPROX      potassium chloride SA 20 MEQ tablet   Commonly known as: K-DUR,KLOR-CON      TAKE these medications         amLODipine 5 MG tablet   Commonly known as: NORVASC   Take 1 tablet (5 mg total) by mouth daily.      CALCIUM 500 PO   Take 2 tablets by mouth daily.      cholecalciferol 1000 UNITS tablet   Commonly known as: VITAMIN D   Take 1,000 Units by mouth daily.      docusate sodium 100 MG capsule   Commonly known as: COLACE   Take 100 mg by mouth 2 (two) times daily.      feeding supplement Liqd   Take 237 mLs by mouth 3 (three) times daily with meals.      ferrous gluconate 324 MG tablet   Commonly known as: FERGON   Take 1 tablet (324 mg total) by mouth 2 (two) times daily before a meal.      omeprazole 20 MG capsule   Commonly known as: PRILOSEC   Take 2 capsules (40 mg total) by mouth daily as needed. For acid reflux      primidone 50 MG tablet   Commonly known as: MYSOLINE   Take 50 mg by mouth daily as needed. For pain      vitamin C 500 MG tablet   Commonly known as: ASCORBIC ACID   Take 500 mg by mouth daily.           Follow-up Information    Please  follow up. (SNF MD in 1-2DAYS)           The results of significant diagnostics from this hospitalization (including imaging, microbiology, ancillary and laboratory) are listed below for reference.    Significant Diagnostic Studies: Nm Gi Blood Loss  04/19/2012  *RADIOLOGY REPORT*  Clinical Data: Hematochezia.  NUCLEAR MEDICINE GASTROINTESTINAL BLEEDING STUDY  Technique:  Sequential abdominal images were  obtained following intravenous administration of Tc-65m labeled red blood cells.  Radiopharmaceutical: CURIE ULTRATAG TECHNETIUM TC 73M- LABELED RED BLOOD CELLS IV KIT  Comparison: Nuclear medicine GI bleeding study 09/20/2011.  Findings: Early uptake is present within the descending colon by 5 minutes.  There is rapid transit to the sigmoid colon by 15 minutes.  The uptake at the ascending colon continues to concentrate.  IMPRESSION:  1.  Early uptake in the descending colon with rapid transit to the sigmoid colon is compatible with an active colonic hemorrhage within the ascending colon.   Original Report Authenticated By: Marin Roberts, M.D.    Ir Angiogram Visceral Selective  04/20/2012  *RADIOLOGY REPORT*  Indication: Acute lower GI bleed, tagged red blood cell scan performed earlier same day demonstrates acute bleeding apparently centered within the distal ascending colon/hepatic flexure.  The patient has history of diverticular disease and several prior episodes of lower GI bleed  MESENTERIC ARTERIOGRAM - SELECTIVE ANGIOGRAMS OF THE CELIAC, SMA AND IMA.  Comparisons: Nuclear medicine tagged red blood cell scan - earlier same date; 09/20/2011; 08/13/2009; mesenteric arteriogram - 08/22/2009  Intravenous Medications: Fentanyl 50 mcg IV; Versed 0.5 mg IV  Contrast: 120 ml Omnipaque-300  Total Moderate Sedation Time: 35 minutes  Fluoroscopy Time: 7.4 minutes.  Complications: None immediate  Technique:  Informed written consent was obtained from the patient and the patient's son after  a discussion of the risks, benefits and alternatives to treatment.  Questions regarding the procedure were encouraged and answered.  A timeout was performed prior to the initiation of the procedure.  The right groin was prepped and draped in the usual sterile fashion, and a sterile drape was applied covering the operative field.  Maximum barrier sterile technique with sterile gowns and gloves were used for the procedure.  A timeout was performed prior to the initiation of the procedure.  Local anesthesia was provided with 1% lidocaine.  The right femoral head was marked fluoroscopically.  Under ultrasound guidance, the right common femoral artery was accessed with a micropuncture kit after the overlying soft tissues were anesthetized with 1% lidocaine.  An ultrasound image was saved for documentation purposes.  The micropuncture sheath was exchanged for a 5 [place] vascular sheath over a Bentson wire.  A closure arteriogram was performed through the side of the sheath confirming access within the right common femoral artery.  Over a Bentson wire, a Sos catheter was advanced to the level of the superior abdominal aorta where it was back bled and flushed. The catheter was then utilized to select the superior mesenteric artery.  Several superior mesenteric arteriograms were performed in various obliquities.  The Sos catheter was advanced cranially and utilized to select the celiac artery.  A celiac arteriogram was performed.  The Sos catheter is then utilized to select the inferior mesenteric artery.  An inferior mesenteric arteriogram was performed.  Finally, the Sos catheter was again utilized to select the superior mesenteric arterial artery and an additional superior mesenteric arteriogram was performed.  All images were reviewed and the procedure was terminated.  All wires, catheters and sheaths were removed from the patient. Hemostasis was achieved at the right groin access site with deployment of an Exoseal  closure device.  A dressing was placed. The patient tolerated well without immediate postprocedural complication.  Findings:  Multiple superior mesenteric arteriograms were performed in various obliquities (including a delayed repeat superior mesenteric arteriogram) centered over the area of interest within the distal aspect of the ascending colon/hepatic flexure  at the presumed to site of active bleeding demonstrated on preprocedural tagged red blood cell scan, however was negative for discrete area of active extravasation of vessel irregularity.  Selective arteriograms of the celiac and IMA was also negative for discrete area of active extravasation or vessel irregularity.  Impression:  Negative mesenteric arteriogram with special attention paid to the assumed area of active bleeding within the distal aspect of the ascending colon/hepatic flexure suggested on preprocedural tagged red blood cell scan.  No embolization performed.   Original Report Authenticated By: Tacey Ruiz, MD    Ir Angiogram Visceral Selective  04/20/2012  *RADIOLOGY REPORT*  Indication: Acute lower GI bleed, tagged red blood cell scan performed earlier same day demonstrates acute bleeding apparently centered within the distal ascending colon/hepatic flexure.  The patient has history of diverticular disease and several prior episodes of lower GI bleed  MESENTERIC ARTERIOGRAM - SELECTIVE ANGIOGRAMS OF THE CELIAC, SMA AND IMA.  Comparisons: Nuclear medicine tagged red blood cell scan - earlier same date; 09/20/2011; 08/13/2009; mesenteric arteriogram - 08/22/2009  Intravenous Medications: Fentanyl 50 mcg IV; Versed 0.5 mg IV  Contrast: 120 ml Omnipaque-300  Total Moderate Sedation Time: 35 minutes  Fluoroscopy Time: 7.4 minutes.  Complications: None immediate  Technique:  Informed written consent was obtained from the patient and the patient's son after a discussion of the risks, benefits and alternatives to treatment.  Questions regarding  the procedure were encouraged and answered.  A timeout was performed prior to the initiation of the procedure.  The right groin was prepped and draped in the usual sterile fashion, and a sterile drape was applied covering the operative field.  Maximum barrier sterile technique with sterile gowns and gloves were used for the procedure.  A timeout was performed prior to the initiation of the procedure.  Local anesthesia was provided with 1% lidocaine.  The right femoral head was marked fluoroscopically.  Under ultrasound guidance, the right common femoral artery was accessed with a micropuncture kit after the overlying soft tissues were anesthetized with 1% lidocaine.  An ultrasound image was saved for documentation purposes.  The micropuncture sheath was exchanged for a 5 [place] vascular sheath over a Bentson wire.  A closure arteriogram was performed through the side of the sheath confirming access within the right common femoral artery.  Over a Bentson wire, a Sos catheter was advanced to the level of the superior abdominal aorta where it was back bled and flushed. The catheter was then utilized to select the superior mesenteric artery.  Several superior mesenteric arteriograms were performed in various obliquities.  The Sos catheter was advanced cranially and utilized to select the celiac artery.  A celiac arteriogram was performed.  The Sos catheter is then utilized to select the inferior mesenteric artery.  An inferior mesenteric arteriogram was performed.  Finally, the Sos catheter was again utilized to select the superior mesenteric arterial artery and an additional superior mesenteric arteriogram was performed.  All images were reviewed and the procedure was terminated.  All wires, catheters and sheaths were removed from the patient. Hemostasis was achieved at the right groin access site with deployment of an Exoseal closure device.  A dressing was placed. The patient tolerated well without immediate  postprocedural complication.  Findings:  Multiple superior mesenteric arteriograms were performed in various obliquities (including a delayed repeat superior mesenteric arteriogram) centered over the area of interest within the distal aspect of the ascending colon/hepatic flexure at the presumed to site of active bleeding demonstrated  on preprocedural tagged red blood cell scan, however was negative for discrete area of active extravasation of vessel irregularity.  Selective arteriograms of the celiac and IMA was also negative for discrete area of active extravasation or vessel irregularity.  Impression:  Negative mesenteric arteriogram with special attention paid to the assumed area of active bleeding within the distal aspect of the ascending colon/hepatic flexure suggested on preprocedural tagged red blood cell scan.  No embolization performed.   Original Report Authenticated By: Tacey Ruiz, MD    Ir Angiogram Visceral Selective  04/20/2012  *RADIOLOGY REPORT*  Indication: Acute lower GI bleed, tagged red blood cell scan performed earlier same day demonstrates acute bleeding apparently centered within the distal ascending colon/hepatic flexure.  The patient has history of diverticular disease and several prior episodes of lower GI bleed  MESENTERIC ARTERIOGRAM - SELECTIVE ANGIOGRAMS OF THE CELIAC, SMA AND IMA.  Comparisons: Nuclear medicine tagged red blood cell scan - earlier same date; 09/20/2011; 08/13/2009; mesenteric arteriogram - 08/22/2009  Intravenous Medications: Fentanyl 50 mcg IV; Versed 0.5 mg IV  Contrast: 120 ml Omnipaque-300  Total Moderate Sedation Time: 35 minutes  Fluoroscopy Time: 7.4 minutes.  Complications: None immediate  Technique:  Informed written consent was obtained from the patient and the patient's son after a discussion of the risks, benefits and alternatives to treatment.  Questions regarding the procedure were encouraged and answered.  A timeout was performed prior to the  initiation of the procedure.  The right groin was prepped and draped in the usual sterile fashion, and a sterile drape was applied covering the operative field.  Maximum barrier sterile technique with sterile gowns and gloves were used for the procedure.  A timeout was performed prior to the initiation of the procedure.  Local anesthesia was provided with 1% lidocaine.  The right femoral head was marked fluoroscopically.  Under ultrasound guidance, the right common femoral artery was accessed with a micropuncture kit after the overlying soft tissues were anesthetized with 1% lidocaine.  An ultrasound image was saved for documentation purposes.  The micropuncture sheath was exchanged for a 5 [place] vascular sheath over a Bentson wire.  A closure arteriogram was performed through the side of the sheath confirming access within the right common femoral artery.  Over a Bentson wire, a Sos catheter was advanced to the level of the superior abdominal aorta where it was back bled and flushed. The catheter was then utilized to select the superior mesenteric artery.  Several superior mesenteric arteriograms were performed in various obliquities.  The Sos catheter was advanced cranially and utilized to select the celiac artery.  A celiac arteriogram was performed.  The Sos catheter is then utilized to select the inferior mesenteric artery.  An inferior mesenteric arteriogram was performed.  Finally, the Sos catheter was again utilized to select the superior mesenteric arterial artery and an additional superior mesenteric arteriogram was performed.  All images were reviewed and the procedure was terminated.  All wires, catheters and sheaths were removed from the patient. Hemostasis was achieved at the right groin access site with deployment of an Exoseal closure device.  A dressing was placed. The patient tolerated well without immediate postprocedural complication.  Findings:  Multiple superior mesenteric arteriograms were  performed in various obliquities (including a delayed repeat superior mesenteric arteriogram) centered over the area of interest within the distal aspect of the ascending colon/hepatic flexure at the presumed to site of active bleeding demonstrated on preprocedural tagged red blood cell scan, however was  negative for discrete area of active extravasation of vessel irregularity.  Selective arteriograms of the celiac and IMA was also negative for discrete area of active extravasation or vessel irregularity.  Impression:  Negative mesenteric arteriogram with special attention paid to the assumed area of active bleeding within the distal aspect of the ascending colon/hepatic flexure suggested on preprocedural tagged red blood cell scan.  No embolization performed.   Original Report Authenticated By: Tacey Ruiz, MD    Ir US Guide Vasc Access Right  04/20/2012  *RADIOLOGY REPORT*  Indication: Acute lower GI bleed, tagged red blood cell scan performed earlier same day demonstrates acute bleeding apparently centered within the distal ascending colon/hepatic flexure.  The patient has history of diverticular disease and several prior episodes of lower GI bleed  MESENTERIC ARTERIOGRAM - SELECTIVE ANGIOGRAMS OF THE CELIAC, SMA AND IMA.  Comparisons: Nuclear medicine tagged red blood cell scan - earlier same date; 09/20/2011; 08/13/2009; mesenteric arteriogram - 08/22/2009  Intravenous Medications: Fentanyl 50 mcg IV; Versed 0.5 mg IV  Contrast: 120 ml Omnipaque-300  Total Moderate Sedation Time: 35 minutes  Fluoroscopy Time: 7.4 minutes.  Complications: None immediate  Technique:  Informed written consent was obtained from the patient and the patient's son after a discussion of the risks, benefits and alternatives to treatment.  Questions regarding the procedure were encouraged and answered.  A timeout was performed prior to the initiation of the procedure.  The right groin was prepped and draped in the usual sterile  fashion, and a sterile drape was applied covering the operative field.  Maximum barrier sterile technique with sterile gowns and gloves were used for the procedure.  A timeout was performed prior to the initiation of the procedure.  Local anesthesia was provided with 1% lidocaine.  The right femoral head was marked fluoroscopically.  Under ultrasound guidance, the right common femoral artery was accessed with a micropuncture kit after the overlying soft tissues were anesthetized with 1% lidocaine.  An ultrasound image was saved for documentation purposes.  The micropuncture sheath was exchanged for a 5 [place] vascular sheath over a Bentson wire.  A closure arteriogram was performed through the side of the sheath confirming access within the right common femoral artery.  Over a Bentson wire, a Sos catheter was advanced to the level of the superior abdominal aorta where it was back bled and flushed. The catheter was then utilized to select the superior mesenteric artery.  Several superior mesenteric arteriograms were performed in various obliquities.  The Sos catheter was advanced cranially and utilized to select the celiac artery.  A celiac arteriogram was performed.  The Sos catheter is then utilized to select the inferior mesenteric artery.  An inferior mesenteric arteriogram was performed.  Finally, the Sos catheter was again utilized to select the superior mesenteric arterial artery and an additional superior mesenteric arteriogram was performed.  All images were reviewed and the procedure was terminated.  All wires, catheters and sheaths were removed from the patient. Hemostasis was achieved at the right groin access site with deployment of an Exoseal closure device.  A dressing was placed. The patient tolerated well without immediate postprocedural complication.  Findings:  Multiple superior mesenteric arteriograms were performed in various obliquities (including a delayed repeat superior mesenteric  arteriogram) centered over the area of interest within the distal aspect of the ascending colon/hepatic flexure at the presumed to site of active bleeding demonstrated on preprocedural tagged red blood cell scan, however was negative for discrete area of active extravasation  of vessel irregularity.  Selective arteriograms of the celiac and IMA was also negative for discrete area of active extravasation or vessel irregularity.  Impression:  Negative mesenteric arteriogram with special attention paid to the assumed area of active bleeding within the distal aspect of the ascending colon/hepatic flexure suggested on preprocedural tagged red blood cell scan.  No embolization performed.   Original Report Authenticated By: Tacey Ruiz, MD     Microbiology: Recent Results (from the past 240 hour(s))  MRSA PCR SCREENING     Status: Abnormal   Collection Time   04/19/12  2:13 AM      Component Value Range Status Comment   MRSA by PCR POSITIVE (*) NEGATIVE Final      Labs: Basic Metabolic Panel:  Lab 04/27/12 9604 04/26/12 0700 04/22/12 0500  NA 143 144 141  K 4.0 2.9* 3.5  CL 110 110 107  CO2 25 26 18*  GLUCOSE 104* 104* 62*  BUN 10 10 16   CREATININE 0.58 0.60 0.64  CALCIUM 8.3* 8.3* 8.2*  MG -- -- 1.9  PHOS -- -- 3.2   Liver Function Tests: No results found for this basename: AST:5,ALT:5,ALKPHOS:5,BILITOT:5,PROT:5,ALBUMIN:5 in the last 168 hours No results found for this basename: LIPASE:5,AMYLASE:5 in the last 168 hours No results found for this basename: AMMONIA:5 in the last 168 hours CBC:  Lab 04/28/12 0515 04/27/12 0815 04/26/12 1920 04/26/12 0700 04/25/12 0610 04/24/12 0530  WBC 6.5 7.6 -- 6.1 6.1 9.8  NEUTROABS -- -- -- -- -- --  HGB 8.7* 9.2* 9.4* 7.1* 7.9* --  HCT 26.9* 27.8* 27.9* 21.4* 23.3* --  MCV 90.3 89.4 -- 89.2 87.6 87.2  PLT 363 331 -- 302 306 304   Cardiac Enzymes: No results found for this basename: CKTOTAL:5,CKMB:5,CKMBINDEX:5,TROPONINI:5 in the last 168  hours BNP: BNP (last 3 results)  Basename 07/17/11 1815 05/15/11 0630  PROBNP 1065.0* 1982.0*   CBG: No results found for this basename: GLUCAP:5 in the last 168 hours     Signed:  Debbra Digiulio C  [facility] 04/28/2012, 2:03 PM

## 2012-04-28 NOTE — Progress Notes (Signed)
Gave report to Inetta Fermo, Charity fundraiser at [employer].  Patient's son now requesting transport for his mother.  Social work notified.  All questions of receiving RN, Inetta Fermo, were answered.  RN states she is comfortable with receiving patient.  RN notified of contact precautions, hemoglobin, vital signs, code status, mobility level and cognitive orientation.  RN states most other history is in the paperwork and that the facility will be doing their own assessment.  Inetta Fermo, RN prefers no further information at this time.  This RN advised receiving nurse to call if any questions do come up.

## 2012-04-28 NOTE — Clinical Social Work Note (Signed)
CSW was consulted to complete discharge of patient. Pt to transfer to [facility] today via [facility]. Family and facility are aware of d/c. D/C packet complete with chart copy, signed FL2.  CSW signing off as no other CSW needs identified at this time.  Lia Foyer, LCSWA [facility] Clinical Social Worker Contact #: [phone] (PRN)

## 2012-05-01 DIAGNOSIS — K625 Hemorrhage of anus and rectum: Secondary | ICD-10-CM | POA: Diagnosis not present

## 2012-05-01 DIAGNOSIS — N181 Chronic kidney disease, stage 1: Secondary | ICD-10-CM | POA: Diagnosis not present

## 2012-05-01 DIAGNOSIS — D62 Acute posthemorrhagic anemia: Secondary | ICD-10-CM | POA: Diagnosis not present

## 2012-05-01 DIAGNOSIS — I5033 Acute on chronic diastolic (congestive) heart failure: Secondary | ICD-10-CM | POA: Diagnosis not present

## 2012-05-01 DIAGNOSIS — K5900 Constipation, unspecified: Secondary | ICD-10-CM | POA: Diagnosis not present

## 2012-05-01 DIAGNOSIS — I150 Renovascular hypertension: Secondary | ICD-10-CM | POA: Diagnosis not present

## 2012-05-06 DIAGNOSIS — I10 Essential (primary) hypertension: Secondary | ICD-10-CM | POA: Diagnosis not present

## 2012-05-06 DIAGNOSIS — R609 Edema, unspecified: Secondary | ICD-10-CM | POA: Diagnosis not present

## 2012-05-06 DIAGNOSIS — D509 Iron deficiency anemia, unspecified: Secondary | ICD-10-CM | POA: Diagnosis not present

## 2012-06-03 DIAGNOSIS — I10 Essential (primary) hypertension: Secondary | ICD-10-CM | POA: Diagnosis not present

## 2012-06-03 DIAGNOSIS — R609 Edema, unspecified: Secondary | ICD-10-CM | POA: Diagnosis not present

## 2012-06-03 DIAGNOSIS — K625 Hemorrhage of anus and rectum: Secondary | ICD-10-CM | POA: Diagnosis not present

## 2012-06-03 DIAGNOSIS — I509 Heart failure, unspecified: Secondary | ICD-10-CM | POA: Diagnosis not present

## 2012-06-03 DIAGNOSIS — D62 Acute posthemorrhagic anemia: Secondary | ICD-10-CM | POA: Diagnosis not present

## 2012-06-04 DIAGNOSIS — K922 Gastrointestinal hemorrhage, unspecified: Secondary | ICD-10-CM | POA: Diagnosis not present

## 2012-06-04 DIAGNOSIS — I10 Essential (primary) hypertension: Secondary | ICD-10-CM | POA: Diagnosis not present

## 2012-06-04 DIAGNOSIS — N189 Chronic kidney disease, unspecified: Secondary | ICD-10-CM | POA: Diagnosis not present

## 2012-06-04 DIAGNOSIS — R5381 Other malaise: Secondary | ICD-10-CM | POA: Diagnosis not present

## 2012-06-04 DIAGNOSIS — I129 Hypertensive chronic kidney disease with stage 1 through stage 4 chronic kidney disease, or unspecified chronic kidney disease: Secondary | ICD-10-CM | POA: Diagnosis not present

## 2012-06-04 DIAGNOSIS — R5383 Other fatigue: Secondary | ICD-10-CM | POA: Diagnosis not present

## 2012-06-04 DIAGNOSIS — I509 Heart failure, unspecified: Secondary | ICD-10-CM | POA: Diagnosis not present

## 2012-06-04 DIAGNOSIS — F0390 Unspecified dementia without behavioral disturbance: Secondary | ICD-10-CM | POA: Diagnosis not present

## 2012-06-04 DIAGNOSIS — N181 Chronic kidney disease, stage 1: Secondary | ICD-10-CM | POA: Diagnosis not present

## 2012-06-08 DIAGNOSIS — K5731 Diverticulosis of large intestine without perforation or abscess with bleeding: Secondary | ICD-10-CM | POA: Diagnosis not present

## 2012-06-08 DIAGNOSIS — I10 Essential (primary) hypertension: Secondary | ICD-10-CM | POA: Diagnosis not present

## 2012-06-09 DIAGNOSIS — I509 Heart failure, unspecified: Secondary | ICD-10-CM | POA: Diagnosis not present

## 2012-06-09 DIAGNOSIS — N181 Chronic kidney disease, stage 1: Secondary | ICD-10-CM | POA: Diagnosis not present

## 2012-06-09 DIAGNOSIS — F0390 Unspecified dementia without behavioral disturbance: Secondary | ICD-10-CM | POA: Diagnosis not present

## 2012-06-09 DIAGNOSIS — I129 Hypertensive chronic kidney disease with stage 1 through stage 4 chronic kidney disease, or unspecified chronic kidney disease: Secondary | ICD-10-CM | POA: Diagnosis not present

## 2012-06-09 DIAGNOSIS — R5381 Other malaise: Secondary | ICD-10-CM | POA: Diagnosis not present

## 2012-06-09 DIAGNOSIS — R5383 Other fatigue: Secondary | ICD-10-CM | POA: Diagnosis not present

## 2012-06-15 DIAGNOSIS — N181 Chronic kidney disease, stage 1: Secondary | ICD-10-CM | POA: Diagnosis not present

## 2012-06-15 DIAGNOSIS — I509 Heart failure, unspecified: Secondary | ICD-10-CM | POA: Diagnosis not present

## 2012-06-15 DIAGNOSIS — F0390 Unspecified dementia without behavioral disturbance: Secondary | ICD-10-CM | POA: Diagnosis not present

## 2012-06-15 DIAGNOSIS — I129 Hypertensive chronic kidney disease with stage 1 through stage 4 chronic kidney disease, or unspecified chronic kidney disease: Secondary | ICD-10-CM | POA: Diagnosis not present

## 2012-06-15 DIAGNOSIS — R5383 Other fatigue: Secondary | ICD-10-CM | POA: Diagnosis not present

## 2012-06-16 DIAGNOSIS — R5381 Other malaise: Secondary | ICD-10-CM | POA: Diagnosis not present

## 2012-06-16 DIAGNOSIS — N390 Urinary tract infection, site not specified: Secondary | ICD-10-CM | POA: Diagnosis not present

## 2012-06-16 DIAGNOSIS — N181 Chronic kidney disease, stage 1: Secondary | ICD-10-CM | POA: Diagnosis not present

## 2012-06-16 DIAGNOSIS — F0390 Unspecified dementia without behavioral disturbance: Secondary | ICD-10-CM | POA: Diagnosis not present

## 2012-06-16 DIAGNOSIS — I129 Hypertensive chronic kidney disease with stage 1 through stage 4 chronic kidney disease, or unspecified chronic kidney disease: Secondary | ICD-10-CM | POA: Diagnosis not present

## 2012-06-16 DIAGNOSIS — I509 Heart failure, unspecified: Secondary | ICD-10-CM | POA: Diagnosis not present

## 2012-06-23 DIAGNOSIS — I129 Hypertensive chronic kidney disease with stage 1 through stage 4 chronic kidney disease, or unspecified chronic kidney disease: Secondary | ICD-10-CM | POA: Diagnosis not present

## 2012-06-23 DIAGNOSIS — N181 Chronic kidney disease, stage 1: Secondary | ICD-10-CM | POA: Diagnosis not present

## 2012-06-23 DIAGNOSIS — R5383 Other fatigue: Secondary | ICD-10-CM | POA: Diagnosis not present

## 2012-06-23 DIAGNOSIS — F0390 Unspecified dementia without behavioral disturbance: Secondary | ICD-10-CM | POA: Diagnosis not present

## 2012-06-23 DIAGNOSIS — I509 Heart failure, unspecified: Secondary | ICD-10-CM | POA: Diagnosis not present

## 2012-06-25 DIAGNOSIS — F0390 Unspecified dementia without behavioral disturbance: Secondary | ICD-10-CM | POA: Diagnosis not present

## 2012-06-25 DIAGNOSIS — I129 Hypertensive chronic kidney disease with stage 1 through stage 4 chronic kidney disease, or unspecified chronic kidney disease: Secondary | ICD-10-CM | POA: Diagnosis not present

## 2012-06-25 DIAGNOSIS — I509 Heart failure, unspecified: Secondary | ICD-10-CM | POA: Diagnosis not present

## 2012-06-25 DIAGNOSIS — N181 Chronic kidney disease, stage 1: Secondary | ICD-10-CM | POA: Diagnosis not present

## 2012-06-25 DIAGNOSIS — R5383 Other fatigue: Secondary | ICD-10-CM | POA: Diagnosis not present

## 2012-06-26 DIAGNOSIS — I509 Heart failure, unspecified: Secondary | ICD-10-CM | POA: Diagnosis not present

## 2012-06-26 DIAGNOSIS — R5381 Other malaise: Secondary | ICD-10-CM | POA: Diagnosis not present

## 2012-06-26 DIAGNOSIS — F0390 Unspecified dementia without behavioral disturbance: Secondary | ICD-10-CM | POA: Diagnosis not present

## 2012-06-26 DIAGNOSIS — I129 Hypertensive chronic kidney disease with stage 1 through stage 4 chronic kidney disease, or unspecified chronic kidney disease: Secondary | ICD-10-CM | POA: Diagnosis not present

## 2012-06-30 DIAGNOSIS — N181 Chronic kidney disease, stage 1: Secondary | ICD-10-CM | POA: Diagnosis not present

## 2012-06-30 DIAGNOSIS — I129 Hypertensive chronic kidney disease with stage 1 through stage 4 chronic kidney disease, or unspecified chronic kidney disease: Secondary | ICD-10-CM | POA: Diagnosis not present

## 2012-06-30 DIAGNOSIS — R5381 Other malaise: Secondary | ICD-10-CM | POA: Diagnosis not present

## 2012-06-30 DIAGNOSIS — F0390 Unspecified dementia without behavioral disturbance: Secondary | ICD-10-CM | POA: Diagnosis not present

## 2012-07-01 DIAGNOSIS — I129 Hypertensive chronic kidney disease with stage 1 through stage 4 chronic kidney disease, or unspecified chronic kidney disease: Secondary | ICD-10-CM | POA: Diagnosis not present

## 2012-07-01 DIAGNOSIS — F0390 Unspecified dementia without behavioral disturbance: Secondary | ICD-10-CM | POA: Diagnosis not present

## 2012-07-01 DIAGNOSIS — N181 Chronic kidney disease, stage 1: Secondary | ICD-10-CM | POA: Diagnosis not present

## 2012-07-01 DIAGNOSIS — R5381 Other malaise: Secondary | ICD-10-CM | POA: Diagnosis not present

## 2012-07-01 DIAGNOSIS — I509 Heart failure, unspecified: Secondary | ICD-10-CM | POA: Diagnosis not present

## 2012-07-03 DIAGNOSIS — F0390 Unspecified dementia without behavioral disturbance: Secondary | ICD-10-CM | POA: Diagnosis not present

## 2012-07-03 DIAGNOSIS — I129 Hypertensive chronic kidney disease with stage 1 through stage 4 chronic kidney disease, or unspecified chronic kidney disease: Secondary | ICD-10-CM | POA: Diagnosis not present

## 2012-07-03 DIAGNOSIS — I509 Heart failure, unspecified: Secondary | ICD-10-CM | POA: Diagnosis not present

## 2012-07-03 DIAGNOSIS — N181 Chronic kidney disease, stage 1: Secondary | ICD-10-CM | POA: Diagnosis not present

## 2012-09-29 DIAGNOSIS — M810 Age-related osteoporosis without current pathological fracture: Secondary | ICD-10-CM | POA: Diagnosis not present

## 2012-09-29 DIAGNOSIS — I10 Essential (primary) hypertension: Secondary | ICD-10-CM | POA: Diagnosis not present

## 2012-10-06 DIAGNOSIS — R609 Edema, unspecified: Secondary | ICD-10-CM | POA: Diagnosis not present

## 2012-10-06 DIAGNOSIS — I10 Essential (primary) hypertension: Secondary | ICD-10-CM | POA: Diagnosis not present

## 2012-10-06 DIAGNOSIS — H6120 Impacted cerumen, unspecified ear: Secondary | ICD-10-CM | POA: Diagnosis not present

## 2012-10-06 DIAGNOSIS — K219 Gastro-esophageal reflux disease without esophagitis: Secondary | ICD-10-CM | POA: Diagnosis not present

## 2013-01-28 ENCOUNTER — Encounter (HOSPITAL_COMMUNITY): Payer: Self-pay | Admitting: Emergency Medicine

## 2013-01-28 ENCOUNTER — Inpatient Hospital Stay (HOSPITAL_COMMUNITY): Payer: Medicare Other

## 2013-01-28 ENCOUNTER — Inpatient Hospital Stay (HOSPITAL_COMMUNITY)
Admission: EM | Admit: 2013-01-28 | Discharge: 2013-02-01 | DRG: 689 | Disposition: A | Discharge status: Skilled Nursing Facility | Payer: Medicare Other | Attending: Family Medicine | Admitting: Family Medicine

## 2013-01-28 ENCOUNTER — Emergency Department (HOSPITAL_COMMUNITY): Payer: Medicare Other

## 2013-01-28 DIAGNOSIS — N181 Chronic kidney disease, stage 1: Secondary | ICD-10-CM | POA: Diagnosis not present

## 2013-01-28 DIAGNOSIS — I129 Hypertensive chronic kidney disease with stage 1 through stage 4 chronic kidney disease, or unspecified chronic kidney disease: Secondary | ICD-10-CM | POA: Diagnosis not present

## 2013-01-28 DIAGNOSIS — R109 Unspecified abdominal pain: Secondary | ICD-10-CM | POA: Diagnosis not present

## 2013-01-28 DIAGNOSIS — D7289 Other specified disorders of white blood cells: Secondary | ICD-10-CM | POA: Diagnosis not present

## 2013-01-28 DIAGNOSIS — I10 Essential (primary) hypertension: Secondary | ICD-10-CM | POA: Diagnosis not present

## 2013-01-28 DIAGNOSIS — R7989 Other specified abnormal findings of blood chemistry: Secondary | ICD-10-CM

## 2013-01-28 DIAGNOSIS — R627 Adult failure to thrive: Secondary | ICD-10-CM | POA: Diagnosis present

## 2013-01-28 DIAGNOSIS — K8000 Calculus of gallbladder with acute cholecystitis without obstruction: Secondary | ICD-10-CM | POA: Diagnosis not present

## 2013-01-28 DIAGNOSIS — A419 Sepsis, unspecified organism: Secondary | ICD-10-CM | POA: Diagnosis not present

## 2013-01-28 DIAGNOSIS — R1013 Epigastric pain: Secondary | ICD-10-CM | POA: Diagnosis not present

## 2013-01-28 DIAGNOSIS — R531 Weakness: Secondary | ICD-10-CM | POA: Diagnosis present

## 2013-01-28 DIAGNOSIS — R112 Nausea with vomiting, unspecified: Secondary | ICD-10-CM

## 2013-01-28 DIAGNOSIS — K429 Umbilical hernia without obstruction or gangrene: Secondary | ICD-10-CM | POA: Diagnosis present

## 2013-01-28 DIAGNOSIS — N179 Acute kidney failure, unspecified: Secondary | ICD-10-CM | POA: Diagnosis present

## 2013-01-28 DIAGNOSIS — Z5189 Encounter for other specified aftercare: Secondary | ICD-10-CM | POA: Diagnosis not present

## 2013-01-28 DIAGNOSIS — R8290 Unspecified abnormal findings in urine: Secondary | ICD-10-CM | POA: Diagnosis present

## 2013-01-28 DIAGNOSIS — D649 Anemia, unspecified: Secondary | ICD-10-CM | POA: Diagnosis not present

## 2013-01-28 DIAGNOSIS — N390 Urinary tract infection, site not specified: Secondary | ICD-10-CM | POA: Diagnosis not present

## 2013-01-28 DIAGNOSIS — D72829 Elevated white blood cell count, unspecified: Secondary | ICD-10-CM | POA: Diagnosis present

## 2013-01-28 DIAGNOSIS — Z860100 Personal history of colon polyps, unspecified: Secondary | ICD-10-CM

## 2013-01-28 DIAGNOSIS — K219 Gastro-esophageal reflux disease without esophagitis: Secondary | ICD-10-CM | POA: Diagnosis present

## 2013-01-28 DIAGNOSIS — R935 Abnormal findings on diagnostic imaging of other abdominal regions, including retroperitoneum: Secondary | ICD-10-CM | POA: Diagnosis not present

## 2013-01-28 DIAGNOSIS — K769 Liver disease, unspecified: Secondary | ICD-10-CM | POA: Diagnosis present

## 2013-01-28 DIAGNOSIS — I4949 Other premature depolarization: Secondary | ICD-10-CM | POA: Diagnosis present

## 2013-01-28 DIAGNOSIS — K8020 Calculus of gallbladder without cholecystitis without obstruction: Secondary | ICD-10-CM | POA: Diagnosis not present

## 2013-01-28 DIAGNOSIS — Z01818 Encounter for other preprocedural examination: Secondary | ICD-10-CM | POA: Diagnosis not present

## 2013-01-28 DIAGNOSIS — M6281 Muscle weakness (generalized): Secondary | ICD-10-CM | POA: Diagnosis not present

## 2013-01-28 DIAGNOSIS — R82998 Other abnormal findings in urine: Secondary | ICD-10-CM

## 2013-01-28 DIAGNOSIS — R509 Fever, unspecified: Secondary | ICD-10-CM

## 2013-01-28 DIAGNOSIS — E46 Unspecified protein-calorie malnutrition: Secondary | ICD-10-CM | POA: Diagnosis present

## 2013-01-28 DIAGNOSIS — R259 Unspecified abnormal involuntary movements: Secondary | ICD-10-CM | POA: Diagnosis present

## 2013-01-28 DIAGNOSIS — I498 Other specified cardiac arrhythmias: Secondary | ICD-10-CM | POA: Diagnosis present

## 2013-01-28 DIAGNOSIS — I5030 Unspecified diastolic (congestive) heart failure: Secondary | ICD-10-CM | POA: Diagnosis not present

## 2013-01-28 DIAGNOSIS — R933 Abnormal findings on diagnostic imaging of other parts of digestive tract: Secondary | ICD-10-CM | POA: Diagnosis not present

## 2013-01-28 DIAGNOSIS — IMO0002 Reserved for concepts with insufficient information to code with codable children: Secondary | ICD-10-CM

## 2013-01-28 DIAGNOSIS — N12 Tubulo-interstitial nephritis, not specified as acute or chronic: Principal | ICD-10-CM | POA: Diagnosis present

## 2013-01-28 DIAGNOSIS — R7401 Elevation of levels of liver transaminase levels: Secondary | ICD-10-CM | POA: Diagnosis not present

## 2013-01-28 DIAGNOSIS — R4182 Altered mental status, unspecified: Secondary | ICD-10-CM | POA: Diagnosis not present

## 2013-01-28 DIAGNOSIS — F0390 Unspecified dementia without behavioral disturbance: Secondary | ICD-10-CM | POA: Diagnosis present

## 2013-01-28 DIAGNOSIS — R404 Transient alteration of awareness: Secondary | ICD-10-CM | POA: Diagnosis not present

## 2013-01-28 DIAGNOSIS — Z79899 Other long term (current) drug therapy: Secondary | ICD-10-CM | POA: Diagnosis not present

## 2013-01-28 DIAGNOSIS — I509 Heart failure, unspecified: Secondary | ICD-10-CM | POA: Diagnosis present

## 2013-01-28 DIAGNOSIS — D509 Iron deficiency anemia, unspecified: Secondary | ICD-10-CM | POA: Diagnosis present

## 2013-01-28 DIAGNOSIS — R5381 Other malaise: Secondary | ICD-10-CM

## 2013-01-28 LAB — COMPREHENSIVE METABOLIC PANEL
ALT: 131 U/L — ABNORMAL HIGH (ref 0–35)
AST: 208 U/L — ABNORMAL HIGH (ref 0–37)
AST: 159 U/L — ABNORMAL HIGH (ref 0–37)
Albumin: 2.7 g/dL — ABNORMAL LOW (ref 3.5–5.2)
Albumin: 2.3 g/dL — ABNORMAL LOW (ref 3.5–5.2)
Alkaline Phosphatase: 66 U/L (ref 39–117)
BUN: 22 mg/dL (ref 6–23)
CO2: 26 mEq/L (ref 19–32)
CO2: 26 mEq/L (ref 19–32)
Calcium: 8.6 mg/dL (ref 8.4–10.5)
Chloride: 102 mEq/L (ref 96–112)
Creatinine, Ser: 0.92 mg/dL (ref 0.50–1.10)
GFR calc non Af Amer: 52 mL/min — ABNORMAL LOW (ref >90)
Potassium: 3.4 mEq/L — ABNORMAL LOW (ref 3.5–5.1)
Sodium: 139 mEq/L (ref 135–145)
Sodium: 138 mEq/L (ref 135–145)
Total Bilirubin: 2.4 mg/dL — ABNORMAL HIGH (ref 0.3–1.2)
Total Protein: 7.9 g/dL (ref 6.0–8.3)

## 2013-01-28 LAB — LIPASE, BLOOD: Lipase: 6 U/L — ABNORMAL LOW (ref 11–59)

## 2013-01-28 LAB — URINALYSIS, ROUTINE W REFLEX MICROSCOPIC
Glucose, UA: NEGATIVE mg/dL
Hgb urine dipstick: NEGATIVE
Ketones, ur: NEGATIVE mg/dL
Nitrite: NEGATIVE
Specific Gravity, Urine: 1.023 (ref 1.005–1.030)
pH: 6 (ref 5.0–8.0)

## 2013-01-28 LAB — CBC
HCT: 31.1 % — ABNORMAL LOW (ref 36.0–46.0)
Hemoglobin: 10.6 g/dL — ABNORMAL LOW (ref 12.0–15.0)
Hemoglobin: 9.7 g/dL — ABNORMAL LOW (ref 12.0–15.0)
MCH: 30.7 pg (ref 26.0–34.0)
MCH: 30.2 pg (ref 26.0–34.0)
MCV: 90.3 fL (ref 78.0–100.0)
RBC: 3.45 MIL/uL — ABNORMAL LOW (ref 3.87–5.11)
RBC: 3.21 MIL/uL — ABNORMAL LOW (ref 3.87–5.11)

## 2013-01-28 LAB — URINE MICROSCOPIC-ADD ON

## 2013-01-28 LAB — POCT I-STAT, CHEM 8
BUN: 20 mg/dL (ref 6–23)
Calcium, Ion: 1.19 mmol/L (ref 1.13–1.30)
Creatinine, Ser: 1.2 mg/dL — ABNORMAL HIGH (ref 0.50–1.10)
TCO2: 23 mmol/L (ref 0–100)

## 2013-01-28 MED ORDER — PANTOPRAZOLE SODIUM 40 MG PO TBEC
40.0000 mg | DELAYED_RELEASE_TABLET | Freq: Every day | ORAL | Status: DC
  Administered 2013-01-28 – 2013-02-01 (×5): 40 mg via ORAL
  Filled 2013-01-28 (×5): qty 1

## 2013-01-28 MED ORDER — INFLUENZA VAC SPLIT QUAD 0.5 ML IM SUSP
0.5000 mL | INTRAMUSCULAR | Status: AC
  Administered 2013-01-29: 0.5 mL via INTRAMUSCULAR
  Filled 2013-01-28 (×2): qty 0.5

## 2013-01-28 MED ORDER — TECHNETIUM TC 99M MEBROFENIN IV KIT
4.8000 | PACK | Freq: Once | INTRAVENOUS | Status: AC | PRN
  Administered 2013-01-28: 4.8 via INTRAVENOUS

## 2013-01-28 MED ORDER — ONDANSETRON HCL 4 MG PO TABS: 4.0000 mg | ORAL_TABLET | Freq: Four times a day (QID) | ORAL | Status: DC | PRN

## 2013-01-28 MED ORDER — ENOXAPARIN SODIUM 30 MG/0.3ML ~~LOC~~ SOLN
30.0000 mg | SUBCUTANEOUS | Status: DC
  Administered 2013-01-28: 10:00:00 30 mg via SUBCUTANEOUS
  Filled 2013-01-28 (×2): qty 0.3

## 2013-01-28 MED ORDER — DEXTROSE 5 % IV SOLN
250.0000 mg | Freq: Every day | INTRAVENOUS | Status: DC
  Administered 2013-01-28 – 2013-01-31 (×4): 250 mg via INTRAVENOUS
  Filled 2013-01-28 (×6): qty 250

## 2013-01-28 MED ORDER — DEXTROSE 5 % IV SOLN
1.0000 g | Freq: Every day | INTRAVENOUS | Status: DC
  Administered 2013-01-28 – 2013-01-31 (×4): 1 g via INTRAVENOUS
  Filled 2013-01-28 (×5): qty 10

## 2013-01-28 MED ORDER — POTASSIUM CHLORIDE 10 MEQ/100ML IV SOLN
10.0000 meq | Freq: Once | INTRAVENOUS | Status: AC
  Administered 2013-01-28: 10 meq via INTRAVENOUS
  Filled 2013-01-28: qty 100

## 2013-01-28 MED ORDER — ONDANSETRON HCL 4 MG/2ML IJ SOLN: 4.0000 mg | Freq: Four times a day (QID) | INTRAMUSCULAR | Status: DC | PRN

## 2013-01-28 MED ORDER — SODIUM CHLORIDE 0.9 % IJ SOLN
3.0000 mL | Freq: Two times a day (BID) | INTRAMUSCULAR | Status: DC
  Administered 2013-01-31 – 2013-02-01 (×2): 3 mL via INTRAVENOUS

## 2013-01-28 MED ORDER — POTASSIUM CHLORIDE IN NACL 20-0.9 MEQ/L-% IV SOLN
INTRAVENOUS | Status: AC
  Administered 2013-01-28: 04:00:00 via INTRAVENOUS
  Filled 2013-01-28: qty 1000

## 2013-01-28 MED ORDER — DEXTROSE 5 % IV SOLN
1.0000 g | Freq: Once | INTRAVENOUS | Status: AC
  Administered 2013-01-28: 1 g via INTRAVENOUS
  Filled 2013-01-28: qty 10

## 2013-01-28 MED ORDER — ONDANSETRON HCL 4 MG/2ML IJ SOLN
4.0000 mg | Freq: Once | INTRAMUSCULAR | Status: AC
  Administered 2013-01-28: 4 mg via INTRAVENOUS
  Filled 2013-01-28: qty 2

## 2013-01-28 MED ORDER — FERROUS SULFATE 325 (65 FE) MG PO TABS
325.0000 mg | ORAL_TABLET | Freq: Every day | ORAL | Status: DC
  Administered 2013-01-28 – 2013-02-01 (×5): 325 mg via ORAL
  Filled 2013-01-28 (×7): qty 1

## 2013-01-28 MED ORDER — POTASSIUM CHLORIDE CRYS ER 20 MEQ PO TBCR
20.0000 meq | EXTENDED_RELEASE_TABLET | Freq: Two times a day (BID) | ORAL | Status: DC
  Administered 2013-01-28 – 2013-02-01 (×9): 20 meq via ORAL
  Filled 2013-01-28 (×12): qty 1

## 2013-01-28 MED ORDER — DEXTROSE 5 % IV SOLN
500.0000 mg | Freq: Once | INTRAVENOUS | Status: AC
  Administered 2013-01-28: 500 mg via INTRAVENOUS
  Filled 2013-01-28: qty 500

## 2013-01-28 MED ORDER — AMLODIPINE BESYLATE 5 MG PO TABS
5.0000 mg | ORAL_TABLET | Freq: Every day | ORAL | Status: DC
  Administered 2013-01-28 – 2013-02-01 (×5): 5 mg via ORAL
  Filled 2013-01-28 (×5): qty 1

## 2013-01-28 MED ORDER — FUROSEMIDE 20 MG PO TABS
20.0000 mg | ORAL_TABLET | Freq: Every day | ORAL | Status: DC
  Administered 2013-01-28 – 2013-02-01 (×5): 20 mg via ORAL
  Filled 2013-01-28 (×5): qty 1

## 2013-01-28 NOTE — Progress Notes (Signed)
Some admission information is missing because the patient is only alert to self.  The son is coming back this morning and he will be able to provide the missing information.  I will pass this on to the day shift nurse.  Ernesta Amble, RN

## 2013-01-28 NOTE — H&P (Signed)
PCP:   Thayer Headings, MD   Chief Complaint:  fever  HPI: 79 yo female lives at home with son comes in with fever.  Per son, pt vomited several times today.  Also with foul smelling urine.  Pt says she been coughing also as does son.  She is minimally ambulatory at home.  No rashes.  C/o no pain right now.  No abd pain.  Son has not noticed any pain issues.  Has gerd, vomits frequently due to her gerd.  Review of Systems:  Positive and negative as per HPI otherwise all other systems are negative  Past Medical History: Past Medical History  Diagnosis Date  . CHF (congestive heart failure)   . Hypertension   . Aneurysm 1972    cerebral  . Acid reflux     occasional  . Tremor     worse on right arm  . History of GI diverticular bleed 06/2009    colonoscopy with endo clipping of bleeding tic by Dr Vida Rigger.  Flex sig by Dr Dulce Sellar.  . Acute blood loss anemia 06/2009    received ~ 6 units blood  . Adenomatous polyp of colon 06/2009  . Diverticulitis   . UTI (lower urinary tract infection)    Past Surgical History  Procedure Laterality Date  . Hip fracture surgery  2011  . Colonoscopy w/ control of hemorrhage  06/2009  . Knee arthroscopy    . Cerebral aneurysm repair  1970's  . Colonoscopy  09/22/2011    Procedure: COLONOSCOPY;  Surgeon: Charna Elizabeth, MD;  Location: [facility] ENDOSCOPY;  Service: Endoscopy;  Laterality: N/A;  wants ped scope    Medications: Prior to Admission medications   Medication Sig Start Date End Date Taking? Authorizing Provider  amLODipine (NORVASC) 5 MG tablet Take 5 mg by mouth daily.   Yes Historical Provider, MD  Calcium Carbonate (CALCIUM 500 PO) Take 2 tablets by mouth daily.    Yes Historical Provider, MD  cholecalciferol (VITAMIN D) 1000 UNITS tablet Take 1,000 Units by mouth daily.   Yes Historical Provider, MD  docusate sodium (COLACE) 100 MG capsule Take 100 mg by mouth 2 (two) times daily.   Yes Historical Provider, MD  ferrous sulfate 325 (65 FE)  MG tablet Take 325 mg by mouth daily with breakfast.   Yes Historical Provider, MD  furosemide (LASIX) 20 MG tablet Take 20 mg by mouth daily.   Yes Historical Provider, MD  omeprazole (PRILOSEC) 20 MG capsule Take 2 capsules (40 mg total) by mouth daily as needed. For acid reflux 04/28/12  Yes Adeline C Viyuoh, MD  potassium chloride SA (K-DUR,KLOR-CON) 20 MEQ tablet Take 20 mEq by mouth 2 (two) times daily.   Yes Historical Provider, MD  primidone (MYSOLINE) 50 MG tablet Take 50 mg by mouth daily as needed. For pain   Yes Historical Provider, MD  vitamin C (ASCORBIC ACID) 500 MG tablet Take 500 mg by mouth daily.   Yes Historical Provider, MD    Allergies:  No Known Allergies  Social History:  reports that she has never smoked. She has never used smokeless tobacco. She reports that she does not drink alcohol or use illicit drugs.  Family History: History reviewed. No pertinent family history.  Physical Exam: Filed Vitals:   01/28/13 0034 01/28/13 0134 01/28/13 0256 01/28/13 0342  BP: 119/45  112/73 112/84  Pulse: 104  89 81  Temp: 99.3 F (37.4 C) 101 F (38.3 C)  98.2 F (36.8 C)  TempSrc:  Oral Rectal  Oral  Resp: 18  16 18   Height:    5\' 8"  (1.727 m)  Weight:    63.5 kg (139 lb 15.9 oz)  SpO2: 94%  95% 96%   General appearance: alert, cooperative and no distress Head: Normocephalic, without obvious abnormality, atraumatic Eyes: negative Nose: Nares normal. Septum midline. Mucosa normal. No drainage or sinus tenderness. Neck: no JVD and supple, symmetrical, trachea midline Lungs: clear to auscultation bilaterally Heart: regular rate and rhythm, S1, S2 normal, no murmur, click, rub or gallop Abdomen: soft, non-tender; bowel sounds normal; no masses,  no organomegaly Extremities: extremities normal, atraumatic, no cyanosis or edema Pulses: 2+ and symmetric Skin: Skin color, texture, turgor normal. No rashes or lesions Neurologic: Grossly normal    Labs on Admission:    Recent Labs  01/28/13 0155 01/28/13 0208  NA 139 144  K 3.4* 3.1*  CL 102 104  CO2 26  --   GLUCOSE 155* 149*  BUN 21 20  CREATININE 0.91 1.20*  CALCIUM 9.1  --     Recent Labs  01/28/13 0155 01/28/13 0208  WBC 29.4*  --   HGB 10.6* 10.9*  HCT 31.1* 32.0*  MCV 90.1  --   PLT 227  --    Radiological Exams on Admission: Dg Chest Portable 1 View  01/28/2013   CLINICAL DATA:  Fever.  EXAM: PORTABLE CHEST - 1 VIEW  COMPARISON:  07/17/2011  FINDINGS: Heart is mildly enlarged. No confluent airspace opacities. Mild biapical pleural thickening/ scarring. No effusions. No acute bony abnormality.  IMPRESSION: No active disease.   Electronically Signed   By: Charlett Nose M.D.   On: 01/28/2013 01:50    Assessment/Plan  79 yo female with fever unclear origin with elevated lfts/tbili/n/v Principal Problem:   Fever Active Problems:   HTN (hypertension)   CKD (chronic kidney disease), stage I   GERD (gastroesophageal reflux disease)   Nausea & vomiting   Generalized weakness   Foul smelling urine   Elevated LFTs   Dementia  Place on iv rocephin/azithro.  Ck abd u/s.  abd exam is benign.  Source urine???  cx pending.  Son would like to think about code status, considering DNR, but full code for now.  Tele bed due to mild tachycardia.    Jahdai Padovano A 01/28/2013, 4:24 AM

## 2013-01-28 NOTE — Consult Note (Signed)
Reason for Consult:Abdominal pain Referring Physician: Dr. Gaye Alken Sarah Perkins is an 79 y.o. female.  HPI: The patient is a 79 year old white female Who lives at home with her son. Apparently she had been running fevers at home. She states that this started in the last couple days. She notes couple episodes of nausea and vomiting. She was brought to the emergency department where an ultrasound was suggestive of stones but did not see any gallbladder wall thickening. Her liver functions were elevated. Since coming to the hospital she is starting to feel better.  Past Medical History  Diagnosis Date  . CHF (congestive heart failure)   . Hypertension   . Aneurysm 1972    cerebral  . Acid reflux     occasional  . Tremor     worse on right arm  . History of GI diverticular bleed 06/2009    colonoscopy with endo clipping of bleeding tic by Dr Vida Rigger.  Flex sig by Dr Dulce Sellar.  . Acute blood loss anemia 06/2009    received ~ 6 units blood  . Adenomatous polyp of colon 06/2009  . Diverticulitis   . UTI (lower urinary tract infection)     Past Surgical History  Procedure Laterality Date  . Hip fracture surgery  2011  . Colonoscopy w/ control of hemorrhage  06/2009  . Knee arthroscopy    . Cerebral aneurysm repair  1970's  . Colonoscopy  09/22/2011    Procedure: COLONOSCOPY;  Surgeon: Charna Elizabeth, MD;  Location: [facility] ENDOSCOPY;  Service: Endoscopy;  Laterality: N/A;  wants ped scope    History reviewed. No pertinent family history.  Social History:  reports that she has never smoked. She has never used smokeless tobacco. She reports that she does not drink alcohol or use illicit drugs.  Allergies: No Known Allergies  Medications: I have reviewed the patient's current medications.  Results for orders placed during the hospital encounter of 01/28/13 (from the past 48 hour(s))  URINALYSIS, ROUTINE W REFLEX MICROSCOPIC     Status: Abnormal   Collection Time    01/28/13  1:35 AM       Result Value Range   Color, Urine ORANGE (*) YELLOW   Comment: BIOCHEMICALS MAY BE AFFECTED BY COLOR   APPearance CLEAR  CLEAR   Specific Gravity, Urine 1.023  1.005 - 1.030   pH 6.0  5.0 - 8.0   Glucose, UA NEGATIVE  NEGATIVE mg/dL   Hgb urine dipstick NEGATIVE  NEGATIVE   Bilirubin Urine SMALL (*) NEGATIVE   Ketones, ur NEGATIVE  NEGATIVE mg/dL   Protein, ur NEGATIVE  NEGATIVE mg/dL   Urobilinogen, UA 1.0  0.0 - 1.0 mg/dL   Nitrite NEGATIVE  NEGATIVE   Leukocytes, UA SMALL (*) NEGATIVE  URINE MICROSCOPIC-ADD ON     Status: Abnormal   Collection Time    01/28/13  1:35 AM      Result Value Range   Squamous Epithelial / LPF FEW (*) RARE   WBC, UA 3-6  <3 WBC/hpf   RBC / HPF 0-2  <3 RBC/hpf   Bacteria, UA RARE  RARE   Urine-Other MUCOUS PRESENT    CBC     Status: Abnormal   Collection Time    01/28/13  1:55 AM      Result Value Range   WBC 29.4 (*) 4.0 - 10.5 K/uL   Comment: REPEATED TO VERIFY   RBC 3.45 (*) 3.87 - 5.11 MIL/uL   Hemoglobin 10.6 (*)  12.0 - 15.0 g/dL   HCT 40.9 (*) 81.1 - 91.4 %   MCV 90.1  78.0 - 100.0 fL   MCH 30.7  26.0 - 34.0 pg   MCHC 34.1  30.0 - 36.0 g/dL   RDW 78.2  95.6 - 21.3 %   Platelets 227  150 - 400 K/uL  LIPASE, BLOOD     Status: Abnormal   Collection Time    01/28/13  1:55 AM      Result Value Range   Lipase 6 (*) 11 - 59 U/L  COMPREHENSIVE METABOLIC PANEL     Status: Abnormal   Collection Time    01/28/13  1:55 AM      Result Value Range   Sodium 139  135 - 145 mEq/L   Potassium 3.4 (*) 3.5 - 5.1 mEq/L   Chloride 102  96 - 112 mEq/L   CO2 26  19 - 32 mEq/L   Glucose, Bld 155 (*) 70 - 99 mg/dL   BUN 21  6 - 23 mg/dL   Creatinine, Ser 0.86  0.50 - 1.10 mg/dL   Calcium 9.1  8.4 - 57.8 mg/dL   Total Protein 7.9  6.0 - 8.3 g/dL   Albumin 2.7 (*) 3.5 - 5.2 g/dL   AST 469 (*) 0 - 37 U/L   ALT 131 (*) 0 - 35 U/L   Alkaline Phosphatase 66  39 - 117 U/L   Total Bilirubin 2.4 (*) 0.3 - 1.2 mg/dL   GFR calc non Af Amer 53 (*) >90  mL/min   GFR calc Af Amer 61 (*) >90 mL/min   Comment: (NOTE)     The eGFR has been calculated using the CKD EPI equation.     This calculation has not been validated in all clinical situations.     eGFR's persistently <90 mL/min signify possible Chronic Kidney     Disease.  POCT I-STAT, CHEM 8     Status: Abnormal   Collection Time    01/28/13  2:08 AM      Result Value Range   Sodium 144  135 - 145 mEq/L   Potassium 3.1 (*) 3.5 - 5.1 mEq/L   Chloride 104  96 - 112 mEq/L   BUN 20  6 - 23 mg/dL   Creatinine, Ser 6.29 (*) 0.50 - 1.10 mg/dL   Glucose, Bld 528 (*) 70 - 99 mg/dL   Calcium, Ion 4.13  2.44 - 1.30 mmol/L   TCO2 23  0 - 100 mmol/L   Hemoglobin 10.9 (*) 12.0 - 15.0 g/dL   HCT 01.0 (*) 27.2 - 53.6 %  CG4 I-STAT (LACTIC ACID)     Status: Abnormal   Collection Time    01/28/13  2:08 AM      Result Value Range   Lactic Acid, Venous 2.90 (*) 0.5 - 2.2 mmol/L  COMPREHENSIVE METABOLIC PANEL     Status: Abnormal   Collection Time    01/28/13  5:50 AM      Result Value Range   Sodium 138  135 - 145 mEq/L   Potassium 3.8  3.5 - 5.1 mEq/L   Comment: RESULT REPEATED AND VERIFIED     DELTA CHECK NOTED   Chloride 102  96 - 112 mEq/L   CO2 26  19 - 32 mEq/L   Glucose, Bld 135 (*) 70 - 99 mg/dL   BUN 22  6 - 23 mg/dL   Creatinine, Ser 6.44  0.50 - 1.10 mg/dL  Calcium 8.6  8.4 - 10.5 mg/dL   Total Protein 6.9  6.0 - 8.3 g/dL   Albumin 2.3 (*) 3.5 - 5.2 g/dL   AST 161 (*) 0 - 37 U/L   ALT 113 (*) 0 - 35 U/L   Alkaline Phosphatase 59  39 - 117 U/L   Total Bilirubin 2.6 (*) 0.3 - 1.2 mg/dL   GFR calc non Af Amer 52 (*) >90 mL/min   GFR calc Af Amer 60 (*) >90 mL/min   Comment: (NOTE)     The eGFR has been calculated using the CKD EPI equation.     This calculation has not been validated in all clinical situations.     eGFR's persistently <90 mL/min signify possible Chronic Kidney     Disease.  CBC     Status: Abnormal   Collection Time    01/28/13  5:50 AM      Result  Value Range   WBC 25.8 (*) 4.0 - 10.5 K/uL   RBC 3.21 (*) 3.87 - 5.11 MIL/uL   Hemoglobin 9.7 (*) 12.0 - 15.0 g/dL   HCT 09.6 (*) 04.5 - 40.9 %   MCV 90.3  78.0 - 100.0 fL   MCH 30.2  26.0 - 34.0 pg   MCHC 33.4  30.0 - 36.0 g/dL   RDW 81.1  91.4 - 78.2 %   Platelets 208  150 - 400 K/uL    US Abdomen Complete  01/28/2013   CLINICAL DATA:  History of elevated liver function tests. History of nausea and vomiting. History of hypertension and congestive heart failure.  EXAM: ULTRASOUND ABDOMEN COMPLETE  COMPARISON:  None.  IMPRESSION: Examination was compromised by patient's inability to cooperate. There is echogenic material within the gallbladder with acoustic shadowing posterior to it felt represent cholelithiasis with details are limited. No gallbladder wall thickening or pericholecystic fluid was evident. Whether there was a positive sonographic Eulah Pont sign could not be established. Common bile duct is normal in caliber with no choledocholithiasis. Question slight dilatation of branching intrahepatic bile ducts. Atherosclerotic plaquing of the abdominal aorta without evidence of aneurysm.  FINDINGS:  THERE IS A NOTE FROM THE TECHNOLOGIST THAT THERE WAS PATIENT CONFUSION.  SOME OF THE EXAMINATION WAS COMPROMISED BY INABILITY OF PATIENT TO COOPERATE.: FINDINGS: THERE IS A NOTE FROM THE TECHNOLOGIST THAT THERE WAS PATIENT CONFUSION. SOME OF THE EXAMINATION WAS COMPROMISED BY INABILITY OF PATIENT TO COOPERATE. Gallbladder  Detail compromised by inability of patient to cooperate. There is echogenic material within the gallbladder with acoustic shadowing posterior to it consistent with cholelithiasis. Individual calculi cannot be measured. Gallbladder wall is normal thickness measuring 1.9 mm. There is a note from the technologist that the patient would not let pressure be put on the gallbladder region so the [facility] sign could not be evaluated. No pericholecystic fluid was identified.  Common bile duct   Diameter: Common bile duct measures 3.5 mm. Question slight intrahepatic biliary dilatation. No choledocholithiasis is seen but the examination was somewhat limited.  Liver  No focal lesion identified. Within normal limits in parenchymal echogenicity.  IVC  No abnormality visualized.  Pancreas  Visualized portion unremarkable.  Spleen  Size and appearance within normal limits.  Length is 6 cm.  Right Kidney  Length: Right renal length measured 10.1 cm. Echogenicity within normal limits. No mass or hydronephrosis visualized. Examination was somewhat limited.  Left Kidney  Length: Left renal length is 10.7 cm. Echogenicity within normal limits. No mass or hydronephrosis visualized.  Abdominal  aorta  No aneurysm visualized. Maximum diameter was 1.8 cm. Most proximal area could not be evaluated. There is some atherosclerotic plaquing.   Electronically Signed   By: Onalee Hua  Call M.D.   On: 01/28/2013 09:02   Dg Chest Portable 1 View  01/28/2013   CLINICAL DATA:  Fever.  EXAM: PORTABLE CHEST - 1 VIEW  COMPARISON:  07/17/2011  FINDINGS: Heart is mildly enlarged. No confluent airspace opacities. Mild biapical pleural thickening/ scarring. No effusions. No acute bony abnormality.  IMPRESSION: No active disease.   Electronically Signed   By: Charlett Nose M.D.   On: 01/28/2013 01:50    Review of Systems  Constitutional: Positive for fever.  HENT: Negative.   Eyes: Negative.   Respiratory: Negative.   Cardiovascular: Negative.   Gastrointestinal: Positive for nausea, vomiting and abdominal pain.  Genitourinary: Negative.   Musculoskeletal: Negative.   Skin: Negative.   Neurological: Negative.   Endo/Heme/Allergies: Negative.   Psychiatric/Behavioral: Negative.    Blood pressure 112/84, pulse 81, temperature 98.2 F (36.8 C), temperature source Oral, resp. rate 18, height 5\' 8"  (1.727 m), weight 139 lb 15.9 oz (63.5 kg), SpO2 96.00%. Physical Exam  Constitutional: She is oriented to person, place, and time.  She appears well-developed and well-nourished.  HENT:  Head: Normocephalic and atraumatic.  Eyes: Conjunctivae and EOM are normal. Pupils are equal, round, and reactive to light.  Neck: Normal range of motion. Neck supple.  Cardiovascular: Normal rate, regular rhythm and normal heart sounds.   Respiratory: Effort normal and breath sounds normal.  GI: Soft.  There is mild RUQ tenderness but no guarding or peritonitis  Musculoskeletal: Normal range of motion.  Neurological: She is alert and oriented to person, place, and time.  Skin: Skin is warm and dry.  Psychiatric: She has a normal mood and affect. Her behavior is normal.    Assessment/Plan: The patient has fever, elevated liver functions, and right upper quadrant pain. This could be consistent with cholangitis. She is currently undergoing a HIDA scan. If she has evidence of common bile duct obstruction then I think the treatment of choice for decompression of the biliary system would be an ERCP study with stent placement. If she continues to improve then she might be a candidate for cholecystectomy and if she is medically a candidate for surgery. We will follow along with you. I agree with broad-spectrum antibiotic coverage as well.  TOTH III,Rion Catala S 01/28/2013, 1:09 PM

## 2013-01-28 NOTE — Progress Notes (Signed)
Clinical Social Work Department CLINICAL SOCIAL WORK PLACEMENT NOTE 01/28/2013  Patient:  Sarah Perkins, Sarah Perkins  Account Number:  [id number] Admit date:  01/28/2013  Clinical Social Worker:  Orpah Greek  Date/time:  01/28/2013 04:16 PM  Clinical Social Work is seeking post-discharge placement for this patient at the following level of care:   SKILLED NURSING   (*CSW will update this form in Epic as items are completed)   01/28/2013  Patient/family provided with [facility] Department of Clinical Social Work's list of facilities offering this level of care within the geographic area requested by the patient (or if unable, by the patient's family).  01/28/2013  Patient/family informed of their freedom to choose among providers that offer the needed level of care, that participate in Medicare, Medicaid or managed care program needed by the patient, have an available bed and are willing to accept the patient.  01/28/2013  Patient/family informed of [facility]' ownership interest in [facility], as well as of the fact that they are under no obligation to receive care at this facility.  PASARR submitted to EDS on 01/28/2013 PASARR number received from EDS on 01/28/2013  FL2 transmitted to all facilities in geographic area requested by pt/family on  01/28/2013 FL2 transmitted to all facilities within larger geographic area on   Patient informed that his/her managed care company has contracts with or will negotiate with  certain facilities, including the following:     Patient/family informed of bed offers received:   Patient chooses bed at  Physician recommends and patient chooses bed at    Patient to be transferred to  on   Patient to be transferred to facility by   The following physician request were entered in Epic:   Additional Comments:   Unice Bailey, LCSW [facility] Clinical Social Worker cell #: [phone]

## 2013-01-28 NOTE — Progress Notes (Signed)
10:23 AM I agree with HPI/GPe and A/P per Dr. Onalee Hua      Slightly confused but knows she is at [facility] NO further N/V/CP Tolerating clears.   Son notes high fever at home over past 1-2 nights No rectal bleeding. Pain not present at rest   HEENT-frail, EOMI, no ict/pallor CHEST-clear CARDIAC-s1 s2 no m/r/g ABDOMEN-Murphy's +.  NO rebound or garuding NEURO-slightly confused SKIN/MUSCULAR-NO ict  Patient Active Problem List   Diagnosis Date Noted  . Nausea & vomiting 01/28/2013  . Fever 01/28/2013  . Generalized weakness 01/28/2013  . Foul smelling urine 01/28/2013  . Elevated LFTs 01/28/2013  . Dementia 01/28/2013  . Anemia 04/19/2012  . Pseudomembranous colitis 09/20/2011  . Acute posthemorrhagic anemia 09/20/2011  . Bright red rectal bleeding 09/19/2011  . Diverticulitis   . HTN (hypertension) 05/15/2011  . CKD (chronic kidney disease), stage I 05/15/2011  . GERD (gastroesophageal reflux disease) 05/15/2011  . Diverticulosis 05/15/2011   Admission 04/18/12 for GI bleed [sigmoid, trasn colon], recurrent Htn, chf Admission 09/19/11 [facility]-diverticular bleed-had colonoscopy that time Admission 07/23/11 HCAP, CDIFF  Admission 05/15/11 SIRS 2/2 to CAP Admission 10/16/08 CAP-nodularity noted  Appreciate Ms Zehr's/Dr. Lauro Franklin input into care Patient may/may not wish surgery if can be avoided-tbd Will follow with am INR/CMEt and Cbc + Diff Keep on clears until seen by GI   Pleas Koch, MD [facility] (P) [phone]

## 2013-01-28 NOTE — Progress Notes (Signed)
Clinical Social Work Department BRIEF PSYCHOSOCIAL ASSESSMENT 01/28/2013  Patient:  Sarah Perkins, Sarah Perkins     Account Number:  [id number]     Admit date:  01/28/2013  Clinical Social Worker:  Orpah Greek  Date/Time:  01/28/2013 03:59 PM  Referred by:  Physician  Date Referred:  01/28/2013 Referred for  SNF Placement   Other Referral:   Interview type:  Patient Other interview type:   and son, Sarah Perkins at bedside    PSYCHOSOCIAL DATA Living Status:  WITH ADULT CHILDREN Admitted from facility:   Level of care:   Primary support name:  Sarah Perkins (son) h#: [phone] c#: [id number] Primary support relationship to patient:  CHILD, ADULT Degree of support available:   good    CURRENT CONCERNS Current Concerns  Post-Acute Placement   Other Concerns:    SOCIAL WORK ASSESSMENT / PLAN CSW received consult from RN that patient will Perkins likely need SNF placement at discharge.   Assessment/plan status:  Information/Referral to [facility] Other assessment/ plan:   Information/referral to community resources:   CSW completed FL2 and faxed information out to [facility] - will provide bed offers when available.    PATIENT'S/FAMILY'S RESPONSE TO PLAN OF CARE: Patient & son are agreeable with plan for SNF - she has been to both [facility] & [facility] in the past.       Unice Bailey, LCSW [facility] Clinical Social Worker cell #: [phone]

## 2013-01-28 NOTE — ED Provider Notes (Signed)
CSN: [id number]     Arrival date & time 01/28/13  0031 History   First MD Initiated Contact with Patient 01/28/13 0105     Chief Complaint  Patient presents with  . Fever   (Consider location/radiation/quality/duration/timing/severity/associated sxs/prior Treatment) HPI Hx per son - lives at home, not feeling well today with fever and generalized weakness. Some strong smelling urine today h/o UTI. No cough or SOB, has h/o CHF.  No CP or ABD pain. Some N/V this afternoon. No diarrhea.  Symptoms MOD in severity.  Past Medical History  Diagnosis Date  . CHF (congestive heart failure)   . Hypertension   . Aneurysm 1972    cerebral  . Acid reflux     occasional  . Tremor     worse on right arm  . History of GI diverticular bleed 06/2009    colonoscopy with endo clipping of bleeding tic by Dr Vida Rigger.  Flex sig by Dr Dulce Sellar.  . Acute blood loss anemia 06/2009    received ~ 6 units blood  . Adenomatous polyp of colon 06/2009  . Diverticulitis   . UTI (lower urinary tract infection)    Past Surgical History  Procedure Laterality Date  . Hip fracture surgery  2011  . Colonoscopy w/ control of hemorrhage  06/2009  . Knee arthroscopy    . Cerebral aneurysm repair  1970's  . Colonoscopy  09/22/2011    Procedure: COLONOSCOPY;  Surgeon: Charna Elizabeth, MD;  Location: [facility] ENDOSCOPY;  Service: Endoscopy;  Laterality: N/A;  wants ped scope   History reviewed. No pertinent family history. History  Substance Use Topics  . Smoking status: Never Smoker   . Smokeless tobacco: Never Used  . Alcohol Use: No   OB History   Grav Para Term Preterm Abortions TAB SAB Ect Mult Living                 Review of Systems  Constitutional: Positive for fever.  HENT: Negative for neck pain and neck stiffness.   Respiratory: Negative for shortness of breath.   Cardiovascular: Negative for chest pain.  Gastrointestinal: Positive for vomiting. Negative for abdominal pain.  Genitourinary: Negative for  dysuria and hematuria.  Musculoskeletal: Negative for back pain.  Skin: Negative for rash.  Neurological: Negative for headaches.  All other systems reviewed and are negative.    Allergies  Review of patient's allergies indicates no known allergies.  Home Medications   Current Outpatient Rx  Name  Route  Sig  Dispense  Refill  . EXPIRED: amLODipine (NORVASC) 5 MG tablet   Oral   Take 1 tablet (5 mg total) by mouth daily.         . Calcium Carbonate (CALCIUM 500 PO)   Oral   Take 2 tablets by mouth daily.          . cholecalciferol (VITAMIN D) 1000 UNITS tablet   Oral   Take 1,000 Units by mouth daily.         Marland Kitchen docusate sodium (COLACE) 100 MG capsule   Oral   Take 100 mg by mouth 2 (two) times daily.         . feeding supplement (ENSURE IMMUNE HEALTH) LIQD   Oral   Take 237 mLs by mouth 3 (three) times daily with meals.         . ferrous gluconate (FERGON) 324 MG tablet   Oral   Take 1 tablet (324 mg total) by mouth 2 (two) times daily before  a meal.   30 tablet   0   . omeprazole (PRILOSEC) 20 MG capsule   Oral   Take 2 capsules (40 mg total) by mouth daily as needed. For acid reflux         . primidone (MYSOLINE) 50 MG tablet   Oral   Take 50 mg by mouth daily as needed. For pain         . vitamin C (ASCORBIC ACID) 500 MG tablet   Oral   Take 500 mg by mouth daily.          BP 119/45  Pulse 104  Temp(Src) 99.3 F (37.4 C) (Oral)  Resp 18  SpO2 94% Physical Exam  Constitutional: She appears well-developed and well-nourished.  HENT:  Head: Normocephalic and atraumatic.  Eyes: Conjunctivae and EOM are normal. Pupils are equal, round, and reactive to light.  Neck: Neck supple.  Cardiovascular: Regular rhythm and intact distal pulses.   HR 90-110  Pulmonary/Chest: Effort normal and breath sounds normal. No respiratory distress. She exhibits no tenderness.  Abdominal: Soft. Bowel sounds are normal. She exhibits no distension. There is  no tenderness.  Musculoskeletal: Normal range of motion. She exhibits no tenderness.  Stage I sacral decub  Neurological:  Awake, alert and oriented, MAE x 4  Skin: Skin is warm and dry. No rash noted.    ED Course  Procedures (including critical care time) Labs Review Labs Reviewed  URINALYSIS, ROUTINE W REFLEX MICROSCOPIC - Abnormal; Notable for the following:    Color, Urine ORANGE (*)    Bilirubin Urine SMALL (*)    Leukocytes, UA SMALL (*)    All other components within normal limits  CBC - Abnormal; Notable for the following:    WBC 29.4 (*)    RBC 3.45 (*)    Hemoglobin 10.6 (*)    HCT 31.1 (*)    All other components within normal limits  URINE MICROSCOPIC-ADD ON - Abnormal; Notable for the following:    Squamous Epithelial / LPF FEW (*)    All other components within normal limits  POCT I-STAT, CHEM 8 - Abnormal; Notable for the following:    Potassium 3.1 (*)    Creatinine, Ser 1.20 (*)    Glucose, Bld 149 (*)    Hemoglobin 10.9 (*)    HCT 32.0 (*)    All other components within normal limits  CG4 I-STAT (LACTIC ACID) - Abnormal; Notable for the following:    Lactic Acid, Venous 2.90 (*)    All other components within normal limits  LIPASE, BLOOD  COMPREHENSIVE METABOLIC PANEL   Imaging Review Dg Chest Portable 1 View  01/28/2013   CLINICAL DATA:  Fever.  EXAM: PORTABLE CHEST - 1 VIEW  COMPARISON:  07/17/2011  FINDINGS: Heart is mildly enlarged. No confluent airspace opacities. Mild biapical pleural thickening/ scarring. No effusions. No acute bony abnormality.  IMPRESSION: No active disease.   Electronically Signed   By: Charlett Nose M.D.   On: 01/28/2013 01:50    Date: 01/28/2013  Rate: 82  Rhythm: normal sinus rhythm  QRS Axis: normal  Intervals: normal  ST/T Wave abnormalities: nonspecific ST changes  Conduction Disutrbances:none  Narrative Interpretation:   Old EKG Reviewed: unchanged  Tylenol, IV ABx D/w Dr Onalee Hua, will admit MDM  Dx: Fever ECG,  CXR, UA, labs IV ABx MED admit    Sunnie Nielsen, MD 01/28/13 [phone]

## 2013-01-28 NOTE — Progress Notes (Signed)
INITIAL NUTRITION ASSESSMENT  DOCUMENTATION CODES Per approved criteria  -Not Applicable   INTERVENTION: - Diet advancement per MD - Discussed low fat diet with son who expressed understanding - Will continue to monitor   NUTRITION DIAGNOSIS: Inadequate oral intake related to clear liquid diet as evidenced by diet order.   Goal: Advance diet as tolerated to low fat diet  Monitor:  Weights, labs, diet advancement  Reason for Assessment: Nutrition risk   79 y.o. female  Admitting Dx: Fever  ASSESSMENT: Pt out of room undergoing HIDA scan. Admitted from home where she lives with her son with fever in addition to one episode of vomiting. Talked with son over the phone who reports pt was having a good appetite PTA, eating 3 meals/day, and would occasionally drink Ensure. Requires her food to be chopped. Stated pt's vomiting started yesterday. Denies any recent falls but states that pt has a hard time with her balance. Her weight is down 5 pounds in the past few weeks. Per GI notes, plan is to rule out cholangitis versus cholecystitis versus choledocholithiasis.   Pt with elevated AST/ALT and total bilirubin.   Height: Ht Readings from Last 1 Encounters:  01/28/13 5\' 8"  (1.727 m)    Weight: Wt Readings from Last 1 Encounters:  01/28/13 139 lb 15.9 oz (63.5 kg)    Ideal Body Weight: 140 lb  % Ideal Body Weight: 99%  Wt Readings from Last 10 Encounters:  01/28/13 139 lb 15.9 oz (63.5 kg)  04/26/12 138 lb 14.2 oz (63 kg)  09/19/11 147 lb (66.679 kg)  09/19/11 147 lb (66.679 kg)  07/18/11 147 lb 3.2 oz (66.769 kg)  05/21/11 157 lb 10.1 oz (71.5 kg)    Usual Body Weight: 144 lb per son  % Usual Body Weight: 96%  BMI:  Body mass index is 21.29 kg/(m^2).  Estimated Nutritional Needs: Kcal: 1600-1800 Protein: 65-75g Fluid: 1.6-1.8L/day  Skin: +2 RLE, LLE edema  Diet Order: Clear Liquid  EDUCATION NEEDS: -No education needs identified at this  time   Intake/Output Summary (Last 24 hours) at 01/28/13 1440 Last data filed at 01/28/13 1100  Gross per 24 hour  Intake    220 ml  Output      0 ml  Net    220 ml    Last BM: PTA  Labs:   Recent Labs Lab 01/28/13 0155 01/28/13 0208 01/28/13 0550  NA 139 144 138  K 3.4* 3.1* 3.8  CL 102 104 102  CO2 26  --  26  BUN 21 20 22   CREATININE 0.91 1.20* 0.92  CALCIUM 9.1  --  8.6  GLUCOSE 155* 149* 135*    CBG (last 3)  No results found for this basename: GLUCAP,  in the last 72 hours  Scheduled Meds: . amLODipine  5 mg Oral Daily  . azithromycin  250 mg Intravenous QHS  . cefTRIAXone (ROCEPHIN)  IV  1 g Intravenous QHS  . enoxaparin (LOVENOX) injection  30 mg Subcutaneous Q24H  . ferrous sulfate  325 mg Oral Q breakfast  . furosemide  20 mg Oral Daily  . [START ON 01/29/2013] influenza vac split quadrivalent PF  0.5 mL Intramuscular Tomorrow-1000  . pantoprazole  40 mg Oral Daily  . potassium chloride SA  20 mEq Oral BID  . sodium chloride  3 mL Intravenous Q12H    Continuous Infusions: . 0.9 % NaCl with KCl 20 mEq / L 75 mL/hr at 01/28/13 0418    Past Medical  History  Diagnosis Date  . CHF (congestive heart failure)   . Hypertension   . Aneurysm 1972    cerebral  . Acid reflux     occasional  . Tremor     worse on right arm  . History of GI diverticular bleed 06/2009    colonoscopy with endo clipping of bleeding tic by Dr Vida Rigger.  Flex sig by Dr Dulce Sellar.  . Acute blood loss anemia 06/2009    received ~ 6 units blood  . Adenomatous polyp of colon 06/2009  . Diverticulitis   . UTI (lower urinary tract infection)     Past Surgical History  Procedure Laterality Date  . Hip fracture surgery  2011  . Colonoscopy w/ control of hemorrhage  06/2009  . Knee arthroscopy    . Cerebral aneurysm repair  1970's  . Colonoscopy  09/22/2011    Procedure: COLONOSCOPY;  Surgeon: Charna Elizabeth, MD;  Location: [facility] ENDOSCOPY;  Service: Endoscopy;  Laterality: N/A;  wants  ped scope    Levon Hedger MS, RD, LDN [phone] Pager [phone] After Hours Pager

## 2013-01-28 NOTE — Progress Notes (Signed)
Called for report and placed on hold 3 different times, left message with Eunice Blase to have nurse call me on [phone]. Will call back in 15 mins if I have not heard back from anyone.  Ernesta Amble, RN

## 2013-01-28 NOTE — Consult Note (Signed)
Patient seen, examined, and I agree with the above documentation, including the assessment and plan. Agree that presentation could be consistent with cholangitis.  She has fever, leukocytosis, RUQ pain (though now better somewhat), and elevated T bili (not jaundiced at present). [place] was sub-optimal due to cooperation. HIDA scan now.  Await results.  Agree with broad-spectrum abx to coverage biliary organisms.  Trend LFTs, IVFs. I have discussed with patient and her son that she may need ERCP, surgery or both.   Appreciate surgical consultation

## 2013-01-28 NOTE — ED Notes (Signed)
Per EMS pt comes from home, lives with her son  Sarah Perkins states she has had a fever since this morning and has seemed more tired today than normal  Pt has hx of UTI  Pt states urine has an odor and is darker in color than normal  Pt states she had one episode of vomiting earlier today

## 2013-01-28 NOTE — ED Notes (Signed)
MD at bedside. 

## 2013-01-28 NOTE — Consult Note (Signed)
Referring Provider: No ref. provider found Primary Care Physician:  Thayer Headings, MD Primary Gastroenterologist:  Gentry Fitz  Reason for Consultation:  Fever, elevated LFT's, abdominal pain  HPI: Sarah Perkins is a 79 y.o. female who lives at home with her son and has PMH of dementia, cerebral aneurysm repair, CHF, and diverticular bleeds.  She had been experiencing high fevers at home along with nausea, vomiting, and some abdominal discomfort.  She vomited several times yesterday and was brought to the ED.  Also had decreased alertness.  In ED was found to have leukocytosis of 29.4.  Has newly elevated LFT's as well with AST 208, ALT 131, ALP 66, and total bili 2.4.  She was admitted and started on antibiotics, zithromax and rocephin.  Ultrasound was performed with the following impression:  "Examination was compromised by patient's inability to cooperate.  There is echogenic material within the gallbladder with acoustic  shadowing posterior to it felt represent cholelithiasis with details  are limited. No gallbladder wall thickening or pericholecystic fluid  was evident. Whether there was a positive sonographic Eulah Pont sign  could not be established. Common bile duct is normal in caliber with  no choledocholithiasis. Question slight dilatation of branching  intrahepatic bile ducts. Atherosclerotic plaquing of the abdominal  aorta without evidence of aneurysm.  FINDINGS: THERE IS A NOTE FROM THE TECHNOLOGIST THAT THERE WAS PATIENT CONFUSION. SOME OF THE EXAMINATION WAS COMPROMISED BY INABILITY OF PATIENT TO COOPERATE.:  FINDINGS: THERE IS A NOTE FROM THE TECHNOLOGIST THAT THERE WAS PATIENT CONFUSION. SOME OF THE EXAMINATION WAS COMPROMISED BY INABILITY OF PATIENT TO COOPERATE."  No further nausea or vomiting today.  No complaints of abdominal pain.  Still with fevers.  Due to dementia she is not able to provide much information.  Her son is at bedside, however, and Dr. Rhea Belton  discussed situation with him.   Past Medical History  Diagnosis Date  . CHF (congestive heart failure)   . Hypertension   . Aneurysm 1972    cerebral  . Acid reflux     occasional  . Tremor     worse on right arm  . History of GI diverticular bleed 06/2009    colonoscopy with endo clipping of bleeding tic by Dr Vida Rigger.  Flex sig by Dr Dulce Sellar.  . Acute blood loss anemia 06/2009    received ~ 6 units blood  . Adenomatous polyp of colon 06/2009  . Diverticulitis   . UTI (lower urinary tract infection)     Past Surgical History  Procedure Laterality Date  . Hip fracture surgery  2011  . Colonoscopy w/ control of hemorrhage  06/2009  . Knee arthroscopy    . Cerebral aneurysm repair  1970's  . Colonoscopy  09/22/2011    Procedure: COLONOSCOPY;  Surgeon: Charna Elizabeth, MD;  Location: [facility] ENDOSCOPY;  Service: Endoscopy;  Laterality: N/A;  wants ped scope    Prior to Admission medications   Medication Sig Start Date End Date Taking? Authorizing Provider  amLODipine (NORVASC) 5 MG tablet Take 5 mg by mouth daily.   Yes Historical Provider, MD  Calcium Carbonate (CALCIUM 500 PO) Take 2 tablets by mouth daily.    Yes Historical Provider, MD  cholecalciferol (VITAMIN D) 1000 UNITS tablet Take 1,000 Units by mouth daily.   Yes Historical Provider, MD  docusate sodium (COLACE) 100 MG capsule Take 100 mg by mouth 2 (two) times daily.   Yes Historical Provider, MD  ferrous sulfate 325 (65 FE) MG tablet Take  325 mg by mouth daily with breakfast.   Yes Historical Provider, MD  furosemide (LASIX) 20 MG tablet Take 20 mg by mouth daily.   Yes Historical Provider, MD  omeprazole (PRILOSEC) 20 MG capsule Take 2 capsules (40 mg total) by mouth daily as needed. For acid reflux 04/28/12  Yes Adeline C Viyuoh, MD  potassium chloride SA (K-DUR,KLOR-CON) 20 MEQ tablet Take 20 mEq by mouth 2 (two) times daily.   Yes Historical Provider, MD  primidone (MYSOLINE) 50 MG tablet Take 50 mg by mouth daily as  needed. For pain   Yes Historical Provider, MD  vitamin C (ASCORBIC ACID) 500 MG tablet Take 500 mg by mouth daily.   Yes Historical Provider, MD    Current Facility-Administered Medications  Medication Dose Route Frequency Provider Last Rate Last Dose  . 0.9 % NaCl with KCl 20 mEq/ L  infusion   Intravenous Continuous Tarry Kos, MD 75 mL/hr at 01/28/13 0418    . amLODipine (NORVASC) tablet 5 mg  5 mg Oral Daily Tarry Kos, MD   5 mg at 01/28/13 1000  . azithromycin (ZITHROMAX) 250 mg in dextrose 5 % 125 mL IVPB  250 mg Intravenous QHS Tarry Kos, MD      . cefTRIAXone (ROCEPHIN) 1 g in dextrose 5 % 50 mL IVPB  1 g Intravenous QHS Tarry Kos, MD      . enoxaparin (LOVENOX) injection 30 mg  30 mg Subcutaneous Q24H Tarry Kos, MD   30 mg at 01/28/13 1000  . ferrous sulfate tablet 325 mg  325 mg Oral Q breakfast Tarry Kos, MD   325 mg at 01/28/13 0800  . furosemide (LASIX) tablet 20 mg  20 mg Oral Daily Tarry Kos, MD   20 mg at 01/28/13 1000  . [START ON 01/29/2013] influenza vac split quadrivalent PF (FLUARIX) injection 0.5 mL  0.5 mL Intramuscular Tomorrow-1000 Rhetta Mura, MD      . ondansetron (ZOFRAN) tablet 4 mg  4 mg Oral Q6H PRN Tarry Kos, MD       Or  . ondansetron (ZOFRAN) injection 4 mg  4 mg Intravenous Q6H PRN Tarry Kos, MD      . pantoprazole (PROTONIX) EC tablet 40 mg  40 mg Oral Daily Tarry Kos, MD   40 mg at 01/28/13 1000  . potassium chloride SA (K-DUR,KLOR-CON) CR tablet 20 mEq  20 mEq Oral BID Tarry Kos, MD   20 mEq at 01/28/13 1000  . sodium chloride 0.9 % injection 3 mL  3 mL Intravenous Q12H Tarry Kos, MD        Allergies as of 01/28/2013  . (No Known Allergies)    History reviewed. No pertinent family history.  History   Social History  . Marital Status: Divorced    Spouse Name: N/A    Number of Children: N/A  . Years of Education: N/A   Occupational History  . Not on file.   Social History Main Topics  . Smoking  status: Never Smoker   . Smokeless tobacco: Never Used  . Alcohol Use: No  . Drug Use: No  . Sexual Activity: No   Other Topics Concern  . Not on file   Social History Narrative  . No narrative on file    Review of Systems: Ten point ROS is O/W negative except as mentioned in HPI.  Physical Exam: Vital signs in last 24 hours: Temp:  [98.2 F (36.8 C)-101 F (38.3 C)] 98.2 F (36.8 C) (10/02 0342)  Pulse Rate:  [81-104] 81 (10/02 0342) Resp:  [16-18] 18 (10/02 0342) BP: (112-119)/(45-84) 112/84 mmHg (10/02 0342) SpO2:  [94 %-96 %] 96 % (10/02 0342) Weight:  [139 lb 15.9 oz (63.5 kg)] 139 lb 15.9 oz (63.5 kg) (10/02 0342)   General:   Alert, elderly, pleasant and cooperative in NAD Head:  Normocephalic and atraumatic. Eyes:  Sclera clear, no icterus.  Conjunctiva pink. Ears:  Slightly HOH. Mouth:  No deformity or lesions.   Lungs:  Slight bibasilar crackles. Heart:  Regular rate and rhythm; 2/6 murmur noted. Abdomen:  Soft, mildly distended.  Umbilical hernia noted.  BS present. Non-tender. Rectal:  Deferred  Msk:  Symmetrical without gross deformities. Pulses:  Normal pulses noted. Extremities:  Without clubbing or edema.  TED hose on B/L LE's. Neurologic:  Grossly normal neurologically. Skin:  Intact without significant lesions or rashes. Psych:  Alert and cooperative. Normal mood and affect.  Intake/Output from previous day: 10/01 0701 - 10/02 0700 In: 100 [IV Piggyback:100] Out: -   Lab Results:  Recent Labs  01/28/13 0155 01/28/13 0208 01/28/13 0550  WBC 29.4*  --  25.8*  HGB 10.6* 10.9* 9.7*  HCT 31.1* 32.0* 29.0*  PLT 227  --  208   BMET  Recent Labs  01/28/13 0155 01/28/13 0208 01/28/13 0550  NA 139 144 138  K 3.4* 3.1* 3.8  CL 102 104 102  CO2 26  --  26  GLUCOSE 155* 149* 135*  BUN 21 20 22   CREATININE 0.91 1.20* 0.92  CALCIUM 9.1  --  8.6   LFT  Recent Labs  01/28/13 0550  PROT 6.9  ALBUMIN 2.3*  AST 159*  ALT 113*  ALKPHOS  59  BILITOT 2.6*   Studies/Results: US Abdomen Complete  01/28/2013   CLINICAL DATA:  History of elevated liver function tests. History of nausea and vomiting. History of hypertension and congestive heart failure.  EXAM: ULTRASOUND ABDOMEN COMPLETE  COMPARISON:  None.  IMPRESSION: Examination was compromised by patient's inability to cooperate. There is echogenic material within the gallbladder with acoustic shadowing posterior to it felt represent cholelithiasis with details are limited. No gallbladder wall thickening or pericholecystic fluid was evident. Whether there was a positive sonographic Eulah Pont sign could not be established. Common bile duct is normal in caliber with no choledocholithiasis. Question slight dilatation of branching intrahepatic bile ducts. Atherosclerotic plaquing of the abdominal aorta without evidence of aneurysm.  FINDINGS:  THERE IS A NOTE FROM THE TECHNOLOGIST THAT THERE WAS PATIENT CONFUSION.  SOME OF THE EXAMINATION WAS COMPROMISED BY INABILITY OF PATIENT TO COOPERATE.: FINDINGS: THERE IS A NOTE FROM THE TECHNOLOGIST THAT THERE WAS PATIENT CONFUSION. SOME OF THE EXAMINATION WAS COMPROMISED BY INABILITY OF PATIENT TO COOPERATE. Gallbladder  Detail compromised by inability of patient to cooperate. There is echogenic material within the gallbladder with acoustic shadowing posterior to it consistent with cholelithiasis. Individual calculi cannot be measured. Gallbladder wall is normal thickness measuring 1.9 mm. There is a note from the technologist that the patient would not let pressure be put on the gallbladder region so the [facility] sign could not be evaluated. No pericholecystic fluid was identified.  Common bile duct  Diameter: Common bile duct measures 3.5 mm. Question slight intrahepatic biliary dilatation. No choledocholithiasis is seen but the examination was somewhat limited.  Liver  No focal lesion identified. Within normal limits in parenchymal echogenicity.  IVC  No  abnormality visualized.  Pancreas  Visualized portion unremarkable.  Spleen  Size and appearance  within normal limits.  Length is 6 cm.  Right Kidney  Length: Right renal length measured 10.1 cm. Echogenicity within normal limits. No mass or hydronephrosis visualized. Examination was somewhat limited.  Left Kidney  Length: Left renal length is 10.7 cm. Echogenicity within normal limits. No mass or hydronephrosis visualized.  Abdominal aorta  No aneurysm visualized. Maximum diameter was 1.8 cm. Most proximal area could not be evaluated. There is some atherosclerotic plaquing.   Electronically Signed   By: Onalee Hua  Call M.D.   On: 01/28/2013 09:02   Dg Chest Portable 1 View  01/28/2013   CLINICAL DATA:  Fever.  EXAM: PORTABLE CHEST - 1 VIEW  COMPARISON:  07/17/2011  FINDINGS: Heart is mildly enlarged. No confluent airspace opacities. Mild biapical pleural thickening/ scarring. No effusions. No acute bony abnormality.  IMPRESSION: No active disease.   Electronically Signed   By: Charlett Nose M.D.   On: 01/28/2013 01:50    IMPRESSION:  -79 year old female presenting with nausea, vomiting, abdominal pain, fevers, leukocytosis, and newly elevated LFT"s.  Rule out cholangitis vs cholecystitis vs choledocholithiasis.    PLAN: -Agree with antibiotics. -Consult surgery. -Can't have MCRP due to aneurysm clip.  Proceed with HIDA scan instead.   ZEHR, JESSICA D.  01/28/2013, 11:13 AM  Pager number [phone]

## 2013-01-29 ENCOUNTER — Inpatient Hospital Stay (HOSPITAL_COMMUNITY): Payer: Medicare Other

## 2013-01-29 DIAGNOSIS — K8020 Calculus of gallbladder without cholecystitis without obstruction: Secondary | ICD-10-CM | POA: Diagnosis not present

## 2013-01-29 DIAGNOSIS — R509 Fever, unspecified: Secondary | ICD-10-CM | POA: Diagnosis not present

## 2013-01-29 DIAGNOSIS — Z01818 Encounter for other preprocedural examination: Secondary | ICD-10-CM | POA: Diagnosis not present

## 2013-01-29 DIAGNOSIS — R112 Nausea with vomiting, unspecified: Secondary | ICD-10-CM | POA: Diagnosis not present

## 2013-01-29 DIAGNOSIS — R7989 Other specified abnormal findings of blood chemistry: Secondary | ICD-10-CM | POA: Diagnosis not present

## 2013-01-29 DIAGNOSIS — I5030 Unspecified diastolic (congestive) heart failure: Secondary | ICD-10-CM | POA: Diagnosis not present

## 2013-01-29 DIAGNOSIS — R1013 Epigastric pain: Secondary | ICD-10-CM | POA: Diagnosis not present

## 2013-01-29 LAB — CBC WITH DIFFERENTIAL/PLATELET
Basophils Absolute: 0 10*3/uL (ref 0.0–0.1)
Basophils Relative: 0 % (ref 0–1)
Eosinophils Relative: 1 % (ref 0–5)
Lymphocytes Relative: 11 % — ABNORMAL LOW (ref 12–46)
Lymphs Abs: 1.8 10*3/uL (ref 0.7–4.0)
MCV: 91.1 fL (ref 78.0–100.0)
Neutrophils Relative %: 79 % — ABNORMAL HIGH (ref 43–77)
Platelets: 210 10*3/uL (ref 150–400)
RDW: 14.7 % (ref 11.5–15.5)
WBC: 16.2 10*3/uL — ABNORMAL HIGH (ref 4.0–10.5)

## 2013-01-29 LAB — PROTIME-INR: INR: 1.32 (ref 0.00–1.49)

## 2013-01-29 LAB — COMPREHENSIVE METABOLIC PANEL
ALT: 78 U/L — ABNORMAL HIGH (ref 0–35)
AST: 76 U/L — ABNORMAL HIGH (ref 0–37)
Albumin: 2.3 g/dL — ABNORMAL LOW (ref 3.5–5.2)
CO2: 24 mEq/L (ref 19–32)
Calcium: 8.6 mg/dL (ref 8.4–10.5)
GFR calc Af Amer: 66 mL/min — ABNORMAL LOW (ref >90)
GFR calc non Af Amer: 57 mL/min — ABNORMAL LOW (ref >90)
Glucose, Bld: 101 mg/dL — ABNORMAL HIGH (ref 70–99)
Sodium: 137 mEq/L (ref 135–145)
Total Protein: 7.1 g/dL (ref 6.0–8.3)

## 2013-01-29 LAB — MRSA PCR SCREENING: MRSA by PCR: NEGATIVE

## 2013-01-29 MED ORDER — ENOXAPARIN SODIUM 40 MG/0.4ML ~~LOC~~ SOLN
40.0000 mg | SUBCUTANEOUS | Status: DC
  Administered 2013-01-30 – 2013-02-01 (×3): 40 mg via SUBCUTANEOUS
  Filled 2013-01-29 (×3): qty 0.4

## 2013-01-29 MED ORDER — IOHEXOL 300 MG/ML  SOLN: 80.0000 mL | Freq: Once | INTRAMUSCULAR | Status: AC | PRN

## 2013-01-29 MED ORDER — IOHEXOL 300 MG/ML  SOLN
80.0000 mL | Freq: Once | INTRAMUSCULAR | Status: AC | PRN
  Administered 2013-01-29: 80 mL via INTRAVENOUS

## 2013-01-29 MED ORDER — IOHEXOL 300 MG/ML  SOLN
25.0000 mL | INTRAMUSCULAR | Status: AC
  Administered 2013-01-29 (×2): 25 mL via ORAL

## 2013-01-29 NOTE — Progress Notes (Signed)
Sarah Perkins Progress Note  Subjective:  Feeling well.  No abdominal pain or nausea/vomiting.  Objective:  Vital signs in last 24 hours: Temp:  [98 F (36.7 C)-98.3 F (36.8 C)] 98 F (36.7 C) (10/03 0436) Pulse Rate:  [77-96] 96 (10/03 0436) Resp:  [18] 18 (10/03 0436) BP: (124-146)/(54-62) 146/56 mmHg (10/03 0436) SpO2:  [90 %-91 %] 90 % (10/03 0436)   General:   Alert, elderly and frail, in NAD.  Slight yellowing of skin. Heart:  Regular rate and rhythm Pulm:  CTAB.  No W/R/R. Abdomen:  Soft, nontender and nondistended. Normal bowel sounds, without guarding, and without rebound.   Extremities:  Without edema. Neurologic:  Grossly normal neurologically. Psych:  Alert and cooperative. Normal mood and affect.  Intake/Output from previous day: 10/02 0701 - 10/03 0700 In: 1577.5 [P.O.:600; I.V.:802.5; IV Piggyback:175] Out: -   Lab Results:  Recent Labs  01/28/13 0155 01/28/13 0208 01/28/13 0550 01/29/13 0343  WBC 29.4*  --  25.8* 16.2*  HGB 10.6* 10.9* 9.7* 9.9*  HCT 31.1* 32.0* 29.0* 29.7*  PLT 227  --  208 210   BMET  Recent Labs  01/28/13 0155 01/28/13 0208 01/28/13 0550 01/29/13 0343  NA 139 144 138 137  K 3.4* 3.1* 3.8 3.9  CL 102 104 102 105  CO2 26  --  26 24  GLUCOSE 155* 149* 135* 101*  BUN 21 20 22 20   CREATININE 0.91 1.20* 0.92 0.85  CALCIUM 9.1  --  8.6 8.6   LFT  Recent Labs  01/29/13 0343  PROT 7.1  ALBUMIN 2.3*  AST 76*  ALT 78*  ALKPHOS 66  BILITOT 3.4*   PT/INR  Recent Labs  01/29/13 0343  LABPROT 16.1*  INR 1.32   Nm Hepatobiliary  01/28/2013   CLINICAL DATA:  Elevated liver function tests. Abdominal pain. Fever. Vomiting.  EXAM: NUCLEAR MEDICINE HEPATOBILIARY IMAGING  TECHNIQUE: Sequential images of the abdomen were obtained out to 60 minutes following intravenous administration of radiopharmaceutical.  COMPARISON:  None.  RADIOPHARMACEUTICALS:  4.31mCi Tc-66m Choletec  FINDINGS: Mildly delayed  radiopharmaceutical uptake by the liver is seen, indicating some degree of hepatocellular dysfunction. Liver is otherwise normal in appearance.  There is no biliary excretion of activity, and therefore gallbladder filling could not be assessed. This may be due to hepatocellular dysfunction although biliary obstruction cannot be excluded.  IMPRESSION: Mildly delayed radiopharmaceutical uptake by the liver, consistent with hepatocellular dysfunction.  Absence of biliary excretion of activity, also likely due to hepatocellular dysfunction although biliary obstruction cannot be excluded.   Electronically Signed   By: Myles Rosenthal   On: 01/28/2013 15:43   US Abdomen Complete  01/28/2013   CLINICAL DATA:  History of elevated liver function tests. History of nausea and vomiting. History of hypertension and congestive heart failure.  EXAM: ULTRASOUND ABDOMEN COMPLETE  COMPARISON:  None.  IMPRESSION: Examination was compromised by patient's inability to cooperate. There is echogenic material within the gallbladder with acoustic shadowing posterior to it felt represent cholelithiasis with details are limited. No gallbladder wall thickening or pericholecystic fluid was evident. Whether there was a positive sonographic Eulah Pont sign could not be established. Common bile duct is normal in caliber with no choledocholithiasis. Question slight dilatation of branching intrahepatic bile ducts. Atherosclerotic plaquing of the abdominal aorta without evidence of aneurysm.  FINDINGS:  THERE IS A NOTE FROM THE TECHNOLOGIST THAT THERE WAS PATIENT CONFUSION.  SOME OF THE EXAMINATION WAS COMPROMISED BY INABILITY OF PATIENT TO COOPERATE.: FINDINGS:  THERE IS A NOTE FROM THE TECHNOLOGIST THAT THERE WAS PATIENT CONFUSION. SOME OF THE EXAMINATION WAS COMPROMISED BY INABILITY OF PATIENT TO COOPERATE. Gallbladder  Detail compromised by inability of patient to cooperate. There is echogenic material within the gallbladder with acoustic shadowing  posterior to it consistent with cholelithiasis. Individual calculi cannot be measured. Gallbladder wall is normal thickness measuring 1.9 mm. There is a note from the technologist that the patient would not let pressure be put on the gallbladder region so the [facility] sign could not be evaluated. No pericholecystic fluid was identified.  Common bile duct  Diameter: Common bile duct measures 3.5 mm. Question slight intrahepatic biliary dilatation. No choledocholithiasis is seen but the examination was somewhat limited.  Liver  No focal lesion identified. Within normal limits in parenchymal echogenicity.  IVC  No abnormality visualized.  Pancreas  Visualized portion unremarkable.  Spleen  Size and appearance within normal limits.  Length is 6 cm.  Right Kidney  Length: Right renal length measured 10.1 cm. Echogenicity within normal limits. No mass or hydronephrosis visualized. Examination was somewhat limited.  Left Kidney  Length: Left renal length is 10.7 cm. Echogenicity within normal limits. No mass or hydronephrosis visualized.  Abdominal aorta  No aneurysm visualized. Maximum diameter was 1.8 cm. Most proximal area could not be evaluated. There is some atherosclerotic plaquing.   Electronically Signed   By: Onalee Hua  Call M.D.   On: 01/28/2013 09:02   Dg Chest Portable 1 View  01/28/2013   CLINICAL DATA:  Fever.  EXAM: PORTABLE CHEST - 1 VIEW  COMPARISON:  07/17/2011  FINDINGS: Heart is mildly enlarged. No confluent airspace opacities. Mild biapical pleural thickening/ scarring. No effusions. No acute bony abnormality.  IMPRESSION: No active disease.   Electronically Signed   By: Charlett Nose M.D.   On: 01/28/2013 01:50    Assessment / Plan: -79 year old female presenting with nausea, vomiting, abdominal pain, fevers, leukocytosis, and newly elevated LFT"s. Rule out cholangitis/CBD stone vs cholecystitis.  HIDA scan not necessarily definitive.  All components improving on broad spectrum abx except bili up  slightly today.  ? ERCP vs observation and conservative treatment since she is improving clinically.  No imminent plans for lap chole per surgery.  Will discuss with Dr. Rhea Belton.    LOS: 1 day   Ron Junco D.  01/29/2013, 9:38 AM  Pager number [phone]

## 2013-01-29 NOTE — Consult Note (Signed)
CARDIOLOGY CONSULT NOTE  Patient ID: Sarah Perkins MRN: [id number] DOB/AGE: 11-11-1917 79 y.o.  Admit date: 01/28/2013 Referring Physician  Pleas Koch, MD Primary Physician:  Thayer Headings, MD Reason for Consultation  Pre op cardiac consultation  HPI: Patient is a 79 year old Caucasian female with history of GI bleed in the past, no source of GI bleed could be found except felt to be due to diverticulosis. She's now admitted with what appears to be acute cholecystitis. Due to her age and history of "CHF" I been asked to see the patient for preoperative cardiac dislocation. Patient's son is present at the bedside.  Patient herself denies any chest pain, shortness of breath, PND or orthopnea. She has had an echocardiogram about a year ago which had revealed normal ejection fraction with mild diastolic dysfunction without any significant valvular abnormalities. There's been no recent admission with congestive heart failure, chest pain. She's laying comfortably in bed flat and denies any specific complaints. No palpitations, no dizziness or syncope history.  She still has mild abdominal discomfort, denies any fever, chills or urinary disturbances.  Past Medical History  Diagnosis Date  . CHF (congestive heart failure)   . Hypertension   . Aneurysm 1972    cerebral  . Acid reflux     occasional  . Tremor     worse on right arm  . History of GI diverticular bleed 06/2009    colonoscopy with endo clipping of bleeding tic by Dr Vida Rigger.  Flex sig by Dr Dulce Sellar.  . Acute blood loss anemia 06/2009    received ~ 6 units blood  . Adenomatous polyp of colon 06/2009  . Diverticulitis   . UTI (lower urinary tract infection)      Past Surgical History  Procedure Laterality Date  . Hip fracture surgery  2011  . Colonoscopy w/ control of hemorrhage  06/2009  . Knee arthroscopy    . Cerebral aneurysm repair  1970's  . Colonoscopy  09/22/2011    Procedure: COLONOSCOPY;  Surgeon: Charna Elizabeth, MD;  Location: [facility] ENDOSCOPY;  Service: Endoscopy;  Laterality: N/A;  wants ped scope     History reviewed. No pertinent family history.   Social History: History   Social History  . Marital Status: Divorced    Spouse Name: N/A    Number of Children: N/A  . Years of Education: N/A   Occupational History  . Not on file.   Social History Main Topics  . Smoking status: Never Smoker   . Smokeless tobacco: Never Used  . Alcohol Use: No  . Drug Use: No  . Sexual Activity: No   Other Topics Concern  . Not on file   Social History Narrative  . No narrative on file     Prescriptions prior to admission  Medication Sig Dispense Refill  . amLODipine (NORVASC) 5 MG tablet Take 5 mg by mouth daily.      . Calcium Carbonate (CALCIUM 500 PO) Take 2 tablets by mouth daily.       . cholecalciferol (VITAMIN D) 1000 UNITS tablet Take 1,000 Units by mouth daily.      Marland Kitchen docusate sodium (COLACE) 100 MG capsule Take 100 mg by mouth 2 (two) times daily.      . ferrous sulfate 325 (65 FE) MG tablet Take 325 mg by mouth daily with breakfast.      . furosemide (LASIX) 20 MG tablet Take 20 mg by mouth daily.      Marland Kitchen omeprazole (PRILOSEC) 20  MG capsule Take 2 capsules (40 mg total) by mouth daily as needed. For acid reflux      . potassium chloride SA (K-DUR,KLOR-CON) 20 MEQ tablet Take 20 mEq by mouth 2 (two) times daily.      . primidone (MYSOLINE) 50 MG tablet Take 50 mg by mouth daily as needed. For pain      . vitamin C (ASCORBIC ACID) 500 MG tablet Take 500 mg by mouth daily.        Scheduled Meds: . amLODipine  5 mg Oral Daily  . azithromycin  250 mg Intravenous QHS  . cefTRIAXone (ROCEPHIN)  IV  1 g Intravenous QHS  . [START ON 01/30/2013] enoxaparin (LOVENOX) injection  40 mg Subcutaneous Q24H  . ferrous sulfate  325 mg Oral Q breakfast  . furosemide  20 mg Oral Daily  . pantoprazole  40 mg Oral Daily  . potassium chloride SA  20 mEq Oral BID  . sodium chloride  3 mL Intravenous  Q12H   Continuous Infusions:  PRN Meds:.iohexol, ondansetron (ZOFRAN) IV, ondansetron  ROS: General: no fevers/chills/night sweats Eyes: no blurry vision, diplopia, or amaurosis Resp: no cough, wheezing, or hemoptysis CV: no edema or palpitations GU: no dysuria, frequency, or hematuria, does have incontinence Skin: no rash Neuro: no headache, numbness, tingling, or weakness of extremities Musculoskeletal: no joint pain or swelling Heme: no bleeding, DVT, or easy bruising    Physical Exam: Blood pressure 146/56, pulse 96, temperature 98 F (36.7 C), temperature source Oral, resp. rate 18, height 5\' 8"  (1.727 m), weight 63.5 kg (139 lb 15.9 oz), SpO2 90.00%.   General appearance: alert, cooperative, appears stated age, no distress and Frail Lungs: clear to auscultation bilaterally Heart: regular rate and rhythm, S1, S2 normal, no murmur, click, rub or gallop Abdomen: Mild guarding and rigidity present diffusely, specifically right hypochondrium. Bowel sounds are heard in all 4 quadrants. Extremities: extremities normal, atraumatic, no cyanosis or edema Pulses: 2+ and symmetric Neurologic: Grossly normal  Labs:   Lab Results  Component Value Date   WBC 16.2* 01/29/2013   HGB 9.9* 01/29/2013   HCT 29.7* 01/29/2013   MCV 91.1 01/29/2013   PLT 210 01/29/2013    Recent Labs Lab 01/29/13 0343  NA 137  K 3.9  CL 105  CO2 24  BUN 20  CREATININE 0.85  CALCIUM 8.6  PROT 7.1  BILITOT 3.4*  ALKPHOS 66  ALT 78*  AST 76*  GLUCOSE 101*   Lab Results  Component Value Date   CKTOTAL 11 07/18/2011   CKMB 1.4 07/18/2011   TROPONINI <0.30 07/18/2011    EKG: 01/29/2013: NSR, normal axis, no evidence of ischemia. Normal EKG.  Echocardiogram 05/16/2011: Normal left systolic function, mild tricuspid regurgitation and mild to moderate pulmonary hypertension. No significant valvular abnormalities otherwise appear   Radiology: Results reviewed and evaluated.  01/28/2013   CLINICAL DATA:   Fever.  EXAM: PORTABLE CHEST - 1 VIEW  COMPARISON:  07/17/2011  FINDINGS: Heart is mildly enlarged. No confluent airspace opacities. Mild biapical pleural thickening/ scarring. No effusions. No acute bony abnormality.  IMPRESSION: No active disease.   Electronically Signed   By: Charlett Nose M.D.   On: 01/28/2013 01:50  ASSESSMENT AND PLAN:  1. Preoperative cardiovascular stratification 2. Hypertension 3. Chronic anemia, iron deficiency, history of GI bleed a year ago, CBC stable.  Recommendation: Her cardiac examination is completely normal without any significant valvular abnormalities on physical exam. Normal EKG.  I have discussed extensively the  patient specifically patient's son regarding her advanced age and need for cardiovascular risk testing including stress test. I do not think this is needed at this point. Patient without any evidence of congestive heart failure, angina pectoris, no significant arrhythmias on telemetry, given her age I think we should proceed with surgery with acceptable risk for cardio vascular morbidity and mortality for her age. Noncardiac complications could certainly contribute to overall morbidity given her age. Patient's family is clearly aware of this.   Could certainly add a low-dose of beta blocker for cardiovascular protection, however this can be discontinued upon discharge. Given her advanced age, do not want use high-dose beta blockers with the possibility of sick sinus syndrome. Otherwise no specific recommendation from cardiology.  Thank you for giving me the opportunity for evaluation of the patient.  Pamella Pert, MD 01/29/2013, 5:12 PM [facility] Pager: [phone] Office: [phone] If no answer Cell [phone]

## 2013-01-29 NOTE — Progress Notes (Signed)
Sarah Perkins AVW:[id number] DOB: [birth year] DOA: 01/28/2013 PCP: Thayer Headings, MD  Brief narrative: 79 year old Caucasian female admitted 01/28/2013 with fever leukocytosis and abdominal pain-initially thought to be urinary tract infection however ultrasound abdomen showed potential cholelithiasis. HIDA scan ordered 10/2 was equivocal and CT scan ordered 10/3 showed no further gallstones. Patient's LFTs trended downwards and general surgery as well as gastroenterology are involved in her care Cardiology consult requested by general surgery for cardiac clearance  Past medical history-As per Problem list Chart reviewed as below-  Consultants:  GI-Pyrtle  Gen surg-Toth  Cardiology-Ganji  Procedures:  See procedure  Antibiotics:  Azithromycin-10/1  Ceftriaxone-10/1   Subjective  Well.  tolerating diet now with clear no abd pain   Objective    Interim History: none  Telemetry: Likely PVCs with sinus arrhythmia  Objective: Filed Vitals:   01/28/13 0342 01/28/13 1607 01/28/13 2203 01/29/13 0436  BP: 112/84 124/54 144/62 146/56  Pulse: 81 77 89 96  Temp: 98.2 F (36.8 C) 98.1 F (36.7 C) 98.3 F (36.8 C) 98 F (36.7 C)  TempSrc: Oral Oral Oral Oral  Resp: 18 18 18 18   Height: 5\' 8"  (1.727 m)     Weight: 63.5 kg (139 lb 15.9 oz)     SpO2: 96% 91% 91% 90%    Intake/Output Summary (Last 24 hours) at 01/29/13 1654 Last data filed at 01/29/13 1233  Gross per 24 hour  Intake    775 ml  Output      0 ml  Net    775 ml    Exam:  General: EOmi, NCAt Cardiovascular: s1 s 2, ? Soft M LUSE Respiratory: clear Abdomen: nt,nd-Umbilical hernia   Data Reviewed: Basic Metabolic Panel:  Recent Labs Lab 01/28/13 0155 01/28/13 0208 01/28/13 0550 01/29/13 0343  NA 139 144 138 137  K 3.4* 3.1* 3.8 3.9  CL 102 104 102 105  CO2 26  --  26 24  GLUCOSE 155* 149* 135* 101*  BUN 21 20 22 20   CREATININE 0.91 1.20* 0.92 0.85  CALCIUM 9.1  --  8.6 8.6    Liver Function Tests:  Recent Labs Lab 01/28/13 0155 01/28/13 0550 01/29/13 0343  AST 208* 159* 76*  ALT 131* 113* 78*  ALKPHOS 66 59 66  BILITOT 2.4* 2.6* 3.4*  PROT 7.9 6.9 7.1  ALBUMIN 2.7* 2.3* 2.3*    Recent Labs Lab 01/28/13 0155  LIPASE 6*   No results found for this basename: AMMONIA,  in the last 168 hours CBC:  Recent Labs Lab 01/28/13 0155 01/28/13 0208 01/28/13 0550 01/29/13 0343  WBC 29.4*  --  25.8* 16.2*  NEUTROABS  --   --   --  12.8*  HGB 10.6* 10.9* 9.7* 9.9*  HCT 31.1* 32.0* 29.0* 29.7*  MCV 90.1  --  90.3 91.1  PLT 227  --  208 210   Cardiac Enzymes: No results found for this basename: CKTOTAL, CKMB, CKMBINDEX, TROPONINI,  in the last 168 hours BNP: No components found with this basename: POCBNP,  CBG: No results found for this basename: GLUCAP,  in the last 168 hours  Recent Results (from the past 240 hour(s))  MRSA PCR SCREENING     Status: None   Collection Time    01/29/13 12:03 PM      Result Value Range Status   MRSA by PCR NEGATIVE  NEGATIVE Final   Comment:            The GeneXpert MRSA Assay (FDA  approved for NASAL specimens     only), is one component of a     comprehensive MRSA colonization     surveillance program. It is not     intended to diagnose MRSA     infection nor to guide or     monitor treatment for     MRSA infections.     Performed at [facility]     Studies:              All Imaging reviewed and is as per above notation   Scheduled Meds: . amLODipine  5 mg Oral Daily  . azithromycin  250 mg Intravenous QHS  . cefTRIAXone (ROCEPHIN)  IV  1 g Intravenous QHS  . [START ON 01/30/2013] enoxaparin (LOVENOX) injection  40 mg Subcutaneous Q24H  . ferrous sulfate  325 mg Oral Q breakfast  . furosemide  20 mg Oral Daily  . pantoprazole  40 mg Oral Daily  . potassium chloride SA  20 mEq Oral BID  . sodium chloride  3 mL Intravenous Q12H   Continuous Infusions:    Assessment/Plan: 1. Likely  cholecystitis-doubt that this is acute ascending cholangitis as patient is clinically much improved from admission. Defer further decision making to gastric neurology and Gen. Surgery-appreciate input.  Appreciate cardiology input for medical clearance although I suspect her baseline METs are decreased. Nature of surgery is intermediate therefore she is at moderate risk.  Continue azithromycin and ceftriaxone-potentially can transition to Zosyn in a.m. 2. Hypertension-moderately well-controlled blood pressure is controlled on amlodipine 5 mg daily 3. Unlikely pyelonephritis-UA was negative therefore no culture 4. PVCs sinus arrhythmia-stable  Code Status: Full Family Communication: Long discussion with son at bedside Disposition Plan: Inpatient   Pleas Koch, MD  [facility] Pager [phone] 01/29/2013, 4:54 PM    LOS: 1 day

## 2013-01-29 NOTE — Evaluation (Signed)
Occupational Therapy Evaluation Patient Details Name: Sarah Perkins MRN: [id number] DOB: 1917/09/06 Today's Date: 01/29/2013 Time: 4098-1191 OT Time Calculation (min): 19 min  OT Assessment / Plan / Recommendation History of present illness The patient is a 79 year old white female Who lives at home with her son. Apparently she had been running fevers at home. She states that this started in the last couple days. She notes couple episodes of nausea and vomiting. She was brought to the emergency department where an ultrasound was suggestive of stones but did not see any gallbladder wall thickening. Her liver functions were elevated. Since coming to the hospital she is starting to feel better   Clinical Impression   Pt presents to OT with problems listed below.  Pt will benefit from skilled OT to increase I with ADL activity and regain I with ADL activity    OT Assessment  Patient needs continued OT Services    Follow Up Recommendations  Home health OT;Supervision/Assistance - 24 hour;SNF       Equipment Recommendations  None recommended by OT       Frequency  Min 2X/week    Precautions / Restrictions Precautions Precautions: Fall       ADL  Grooming: Minimal assistance Where Assessed - Grooming: Unsupported sitting Upper Body Bathing: Minimal assistance Where Assessed - Upper Body Bathing: Supported sitting Lower Body Bathing: +2 Total assistance Lower Body Bathing: Patient Percentage: 40% Where Assessed - Lower Body Bathing: Supported sit to stand Upper Body Dressing: Minimal assistance Where Assessed - Upper Body Dressing: Supported sitting Lower Body Dressing: +2 Total assistance Lower Body Dressing: Patient Percentage: 40% Where Assessed - Lower Body Dressing: Supported sit to Pharmacist, hospital: +2 Total assistance Toilet Transfer: Patient Percentage: 40% Toilet Transfer Method: Sit to stand;Stand pivot Where Assessed - Glass blower/designer Manipulation and  Hygiene: Standing Tub/Shower Transfer: +2 Total assistance ADL Comments: Son reports pt does have a posterior lean.  Pt did initiallty upon sitting- but improved with time and encouragement to lean forward    OT Diagnosis: Generalized weakness  OT Problem List: Decreased strength;Impaired balance (sitting and/or standing) OT Treatment Interventions: Self-care/ADL training;Patient/family education   OT Goals(Current goals can be found in the care plan section) Acute Rehab OT Goals Patient Stated Goal: none stated OT Goal Formulation: With patient Time For Goal Achievement: 02/12/13  Visit Information  Assistance Needed: +2 History of Present Illness: The patient is a 79 year old white female Who lives at home with her son. Apparently she had been running fevers at home. She states that this started in the last couple days. She notes couple episodes of nausea and vomiting. She was brought to the emergency department where an ultrasound was suggestive of stones but did not see any gallbladder wall thickening. Her liver functions were elevated. Since coming to the hospital she is starting to feel better       Prior Functioning     Home Living Family/patient expects to be discharged to:: Other (Comment) (chart eludes to SNF upon DC ) Living Arrangements: Children Available Help at Discharge: Family;Available 24 hours/day Type of Home: House Home Access: Stairs to enter [employer] of Steps: 1 Entrance Stairs-Rails: None Home Layout: One level Home Equipment: Wheelchair - manual Prior Function Level of Independence: Needs assistance Gait / Transfers Assistance Needed: assist for SPT, wasn't walking, used WC at home ADL's / Homemaking Assistance Needed: assist to sponge bathe Communication Communication: No difficulties  Vision/Perception Vision - History Baseline Vision: Wears glasses all the time   Cognition  Cognition Overall Cognitive Status: History  of cognitive impairments - at baseline    Extremity/Trunk Assessment Upper Extremity Assessment Upper Extremity Assessment: Generalized weakness     Mobility Bed Mobility Bed Mobility: Supine to Sit Supine to Sit: 1: +2 Total assist;With rails Supine to Sit: Patient Percentage: 20% Transfers Transfers: Sit to Stand;Stand to Sit Sit to Stand: From bed;With upper extremity assist;1: +2 Total assist Sit to Stand: Patient Percentage: 40% Stand to Sit: To chair/3-in-1;With upper extremity assist;1: +2 Total assist;With armrests Stand to Sit: Patient Percentage: 40%           End of Session OT - End of Session Activity Tolerance: Patient tolerated treatment well Patient left: in chair;with chair alarm set;with call bell/phone within reach Nurse Communication: Mobility status  GO     Alba Cory 01/29/2013, 9:48 AM

## 2013-01-29 NOTE — Progress Notes (Signed)
Subjective: She is feeling much better. Denies abdominal pain now  Objective: Vital signs in last 24 hours: Temp:  [98 F (36.7 C)-98.3 F (36.8 C)] 98 F (36.7 C) (10/03 0436) Pulse Rate:  [77-96] 96 (10/03 0436) Resp:  [18] 18 (10/03 0436) BP: (124-146)/(54-62) 146/56 mmHg (10/03 0436) SpO2:  [90 %-91 %] 90 % (10/03 0436)    Intake/Output from previous day: 10/02 0701 - 10/03 0700 In: 1577.5 [P.O.:600; I.V.:802.5; IV Piggyback:175] Out: -  Intake/Output this shift:    GI: soft, nontender  Lab Results:   Recent Labs  01/28/13 0550 01/29/13 0343  WBC 25.8* 16.2*  HGB 9.7* 9.9*  HCT 29.0* 29.7*  PLT 208 210   BMET  Recent Labs  01/28/13 0550 01/29/13 0343  NA 138 137  K 3.8 3.9  CL 102 105  CO2 26 24  GLUCOSE 135* 101*  BUN 22 20  CREATININE 0.92 0.85  CALCIUM 8.6 8.6   PT/INR  Recent Labs  01/29/13 0343  LABPROT 16.1*  INR 1.32   ABG No results found for this basename: PHART, PCO2, PO2, HCO3,  in the last 72 hours  Studies/Results: Nm Hepatobiliary  01/28/2013   CLINICAL DATA:  Elevated liver function tests. Abdominal pain. Fever. Vomiting.  EXAM: NUCLEAR MEDICINE HEPATOBILIARY IMAGING  TECHNIQUE: Sequential images of the abdomen were obtained out to 60 minutes following intravenous administration of radiopharmaceutical.  COMPARISON:  None.  RADIOPHARMACEUTICALS:  4.63mCi Tc-32m Choletec  FINDINGS: Mildly delayed radiopharmaceutical uptake by the liver is seen, indicating some degree of hepatocellular dysfunction. Liver is otherwise normal in appearance.  There is no biliary excretion of activity, and therefore gallbladder filling could not be assessed. This may be due to hepatocellular dysfunction although biliary obstruction cannot be excluded.  IMPRESSION: Mildly delayed radiopharmaceutical uptake by the liver, consistent with hepatocellular dysfunction.  Absence of biliary excretion of activity, also likely due to hepatocellular dysfunction  although biliary obstruction cannot be excluded.   Electronically Signed   By: Myles Rosenthal   On: 01/28/2013 15:43   US Abdomen Complete  01/28/2013   CLINICAL DATA:  History of elevated liver function tests. History of nausea and vomiting. History of hypertension and congestive heart failure.  EXAM: ULTRASOUND ABDOMEN COMPLETE  COMPARISON:  None.  IMPRESSION: Examination was compromised by patient's inability to cooperate. There is echogenic material within the gallbladder with acoustic shadowing posterior to it felt represent cholelithiasis with details are limited. No gallbladder wall thickening or pericholecystic fluid was evident. Whether there was a positive sonographic Eulah Pont sign could not be established. Common bile duct is normal in caliber with no choledocholithiasis. Question slight dilatation of branching intrahepatic bile ducts. Atherosclerotic plaquing of the abdominal aorta without evidence of aneurysm.  FINDINGS:  THERE IS A NOTE FROM THE TECHNOLOGIST THAT THERE WAS PATIENT CONFUSION.  SOME OF THE EXAMINATION WAS COMPROMISED BY INABILITY OF PATIENT TO COOPERATE.: FINDINGS: THERE IS A NOTE FROM THE TECHNOLOGIST THAT THERE WAS PATIENT CONFUSION. SOME OF THE EXAMINATION WAS COMPROMISED BY INABILITY OF PATIENT TO COOPERATE. Gallbladder  Detail compromised by inability of patient to cooperate. There is echogenic material within the gallbladder with acoustic shadowing posterior to it consistent with cholelithiasis. Individual calculi cannot be measured. Gallbladder wall is normal thickness measuring 1.9 mm. There is a note from the technologist that the patient would not let pressure be put on the gallbladder region so the [facility] sign could not be evaluated. No pericholecystic fluid was identified.  Common bile duct  Diameter: Common bile  duct measures 3.5 mm. Question slight intrahepatic biliary dilatation. No choledocholithiasis is seen but the examination was somewhat limited.  Liver  No focal lesion  identified. Within normal limits in parenchymal echogenicity.  IVC  No abnormality visualized.  Pancreas  Visualized portion unremarkable.  Spleen  Size and appearance within normal limits.  Length is 6 cm.  Right Kidney  Length: Right renal length measured 10.1 cm. Echogenicity within normal limits. No mass or hydronephrosis visualized. Examination was somewhat limited.  Left Kidney  Length: Left renal length is 10.7 cm. Echogenicity within normal limits. No mass or hydronephrosis visualized.  Abdominal aorta  No aneurysm visualized. Maximum diameter was 1.8 cm. Most proximal area could not be evaluated. There is some atherosclerotic plaquing.   Electronically Signed   By: Onalee Hua  Call M.D.   On: 01/28/2013 09:02   Dg Chest Portable 1 View  01/28/2013   CLINICAL DATA:  Fever.  EXAM: PORTABLE CHEST - 1 VIEW  COMPARISON:  07/17/2011  FINDINGS: Heart is mildly enlarged. No confluent airspace opacities. Mild biapical pleural thickening/ scarring. No effusions. No acute bony abnormality.  IMPRESSION: No active disease.   Electronically Signed   By: Charlett Nose M.D.   On: 01/28/2013 01:50    Anti-infectives: Anti-infectives   Start     Dose/Rate Route Frequency Ordered Stop   01/28/13 2200  cefTRIAXone (ROCEPHIN) 1 g in dextrose 5 % 50 mL IVPB     1 g 100 mL/hr over 30 Minutes Intravenous Daily at bedtime 01/28/13 0348     01/28/13 2200  azithromycin (ZITHROMAX) 250 mg in dextrose 5 % 125 mL IVPB     250 mg 125 mL/hr over 60 Minutes Intravenous Daily at bedtime 01/28/13 0348     01/28/13 0230  azithromycin (ZITHROMAX) 500 mg in dextrose 5 % 250 mL IVPB     500 mg 250 mL/hr over 60 Minutes Intravenous  Once 01/28/13 0218 01/28/13 0346   01/28/13 0115  cefTRIAXone (ROCEPHIN) 1 g in dextrose 5 % 50 mL IVPB     1 g 100 mL/hr over 30 Minutes Intravenous  Once 01/28/13 0113 01/28/13 0221      Assessment/Plan: s/p * No surgery found * Continue abx Since she is improving I think nothing needs to be  done urgently. The question is whether she should have ERCP and/or surgery. Some of her lft's are improved while others are worse so the decision is not clear. If her lft's do not completely normalize then i would favor ERCP. I think she should have a cardiac evaluation to define her risk with surgery. We will continue to follow  LOS: 1 day    TOTH III,Yicel Shannon S 01/29/2013

## 2013-01-29 NOTE — Evaluation (Signed)
Physical Therapy Evaluation Patient Details Name: Sarah Perkins MRN: [id number] DOB: Dec 22, 1917 Today's Date: 01/29/2013 Time: 4098-1191 PT Time Calculation (min): 19 min  PT Assessment / Plan / Recommendation History of Present Illness  The patient is a 79 year old white female Who lives at home with her son. Apparently she had been running fevers at home. She states that this started in the last couple days. She notes couple episodes of nausea and vomiting. She was brought to the emergency department where an ultrasound was suggestive of stones but did not see any gallbladder wall thickening. Her liver functions were elevated. Since coming to the hospital she is starting to feel better  Clinical Impression  *Pt admitted with fever**. Pt currently with functional limitations due to the deficits listed below (see PT Problem List).** Pt will benefit from skilled PT to increase their independence and safety with mobility to allow discharge to the venue listed below. *  **    PT Assessment  Patient needs continued PT services    Follow Up Recommendations  SNF if son unable to provide 24* assist HHPT if son can care for pt at home    Does the patient have the potential to tolerate intense rehabilitation      Barriers to Discharge        Equipment Recommendations  None recommended by PT    Recommendations for Other Services OT consult   Frequency Min 3X/week    Precautions / Restrictions Precautions Precautions: Fall Precaution Comments: posterior lean Restrictions Weight Bearing Restrictions: No   Pertinent Vitals/Pain *pt denied pain**      Mobility  Bed Mobility Bed Mobility: Supine to Sit Supine to Sit: 1: +2 Total assist;With rails Supine to Sit: Patient Percentage: 20% Details for Bed Mobility Assistance: assist to advance BLEs and to raise trunk Transfers Transfers: Stand Pivot Transfers Sit to Stand: From bed;With upper extremity assist;1: +2 Total  assist Sit to Stand: Patient Percentage: 40% Stand to Sit: To chair/3-in-1;With upper extremity assist;1: +2 Total assist;With armrests Stand to Sit: Patient Percentage: 40% Stand Pivot Transfers: 1: +2 Total assist Stand Pivot Transfers: Patient Percentage: 50% Details for Transfer Assistance: assist to rise and to steady 2* posterior lean; verbal and manual cues for SPT Ambulation/Gait Ambulation/Gait Assistance: Not tested (comment)    Exercises     PT Diagnosis: Difficulty walking;Generalized weakness  PT Problem List: Decreased strength;Decreased activity tolerance;Decreased balance;Decreased mobility PT Treatment Interventions: Gait training;Functional mobility training;DME instruction;Therapeutic activities;Therapeutic exercise;Patient/family education     PT Goals(Current goals can be found in the care plan section) Acute Rehab PT Goals Patient Stated Goal: to get moving PT Goal Formulation: With patient Time For Goal Achievement: 02/12/13 Potential to Achieve Goals: Good  Visit Information  Last PT Received On: 01/29/13 Assistance Needed: +2 History of Present Illness: The patient is a 79 year old white female Who lives at home with her son. Apparently she had been running fevers at home. She states that this started in the last couple days. She notes couple episodes of nausea and vomiting. She was brought to the emergency department where an ultrasound was suggestive of stones but did not see any gallbladder wall thickening. Her liver functions were elevated. Since coming to the hospital she is starting to feel better       Prior Functioning  Home Living Family/patient expects to be discharged to:: Other (Comment) (chart eludes to SNF upon DC ) Living Arrangements: Children Available Help at Discharge: Family;Available 24 hours/day Type  of Home: House Home Access: Stairs to enter [employer] of Steps: 1 Entrance Stairs-Rails: None Home Layout: One  level Home Equipment: Wheelchair - manual Prior Function Level of Independence: Needs assistance Gait / Transfers Assistance Needed: assist for SPT, wasn't walking, used WC at home ADL's / Homemaking Assistance Needed: assist to sponge bathe Communication Communication: No difficulties    Cognition  Cognition Arousal/Alertness: Awake/alert Behavior During Therapy: WFL for tasks assessed/performed Overall Cognitive Status: History of cognitive impairments - at baseline    Extremity/Trunk Assessment Upper Extremity Assessment Upper Extremity Assessment: Generalized weakness Lower Extremity Assessment Lower Extremity Assessment: Generalized weakness;RLE deficits/detail;LLE deficits/detail RLE Deficits / Details: -4/5 knee ext LLE Deficits / Details: -4/5 knee ext Cervical / Trunk Assessment Cervical / Trunk Assessment: Kyphotic   Balance Balance Balance Assessed: Yes Static Sitting Balance Static Sitting - Level of Assistance: 3: Mod assist;4: Min assist Static Sitting - Comment/# of Minutes: 4 minutes, intially mod assist for posterior lean, then min assist after verbal cues to lean forward  End of Session PT - End of Session Equipment Utilized During Treatment: Gait belt Activity Tolerance: Patient tolerated treatment well Patient left: in chair;with call bell/phone within reach;with chair alarm set Nurse Communication: Mobility status  GP     Tamala Ser 01/29/2013, 9:57 AM [phone]

## 2013-01-29 NOTE — Progress Notes (Signed)
Patient seen, examined, and I agree with the above documentation, including the assessment and plan. Overall patient is improving clinically and has defervesced.  Leukocytosis is also improving on antibiotics Agree that there is no urgent indication for therapeutic intervention such as ERCP at this time. Her total bilirubin has increased slightly while her other liver enzymes have decreased.  She cannot have MRCP due to a metal coil in her brain She may need ERCP during his hospitalization, but for now will continue supportive care with IV antibiotics, IV fluids, and trending her liver enzymes We will proceed with cross-sectional imaging to evaluate the liver and biliary tree with CT scan.  This will not definitively exclude choledocholithiasis, but should help evaluate for significant biliary ductal dilatation. Will follow

## 2013-01-30 DIAGNOSIS — R933 Abnormal findings on diagnostic imaging of other parts of digestive tract: Secondary | ICD-10-CM | POA: Diagnosis not present

## 2013-01-30 DIAGNOSIS — D7289 Other specified disorders of white blood cells: Secondary | ICD-10-CM | POA: Diagnosis not present

## 2013-01-30 DIAGNOSIS — K8020 Calculus of gallbladder without cholecystitis without obstruction: Secondary | ICD-10-CM | POA: Diagnosis not present

## 2013-01-30 DIAGNOSIS — R112 Nausea with vomiting, unspecified: Secondary | ICD-10-CM | POA: Diagnosis not present

## 2013-01-30 LAB — COMPREHENSIVE METABOLIC PANEL WITH GFR
ALT: 52 U/L — ABNORMAL HIGH (ref 0–35)
AST: 36 U/L (ref 0–37)
Albumin: 2.2 g/dL — ABNORMAL LOW (ref 3.5–5.2)
Alkaline Phosphatase: 91 U/L (ref 39–117)
BUN: 12 mg/dL (ref 6–23)
CO2: 23 meq/L (ref 19–32)
Calcium: 9 mg/dL (ref 8.4–10.5)
Chloride: 103 meq/L (ref 96–112)
Creatinine, Ser: 0.65 mg/dL (ref 0.50–1.10)
GFR calc Af Amer: 86 mL/min — ABNORMAL LOW
GFR calc non Af Amer: 74 mL/min — ABNORMAL LOW
Glucose, Bld: 89 mg/dL (ref 70–99)
Potassium: 3.9 meq/L (ref 3.5–5.1)
Sodium: 135 meq/L (ref 135–145)
Total Bilirubin: 2.3 mg/dL — ABNORMAL HIGH (ref 0.3–1.2)
Total Protein: 7.5 g/dL (ref 6.0–8.3)

## 2013-01-30 NOTE — Progress Notes (Signed)
General Surgery Note  LOS: 2 days   Assessment/Plan: 1.  Hepatocellular dysfuncton (possibly due to acute infection)  T. Bili - 2.3, ALT - 52 - 01/30/2013  CT scan - 01/29/2013 - shows no biliary dilitation, HIDA scan - 01/28/2013 - shows possible hepatocellular dysfunction  WBC - 16,200 - 01/29/2013  On Zithromax and Rocephin  No evidence of acute cholecystitis or cholangitis.  No reason for surgery.  I talked to son, Leonette Most, and he is not interested in her having any surgery.  2.  Cholelithiasis 3.  Abnormal LFT's - improving 4.  Moderate HH seen on CT scan - 01/29/2013 5.  Hypertension 6.  Cardiac issues  Seen by Dr. Jeanella Cara who does not see her as excessive risk for surgery (for her age!) 32.  Malnourished   Alb - 2.2 - 01/30/2013 8.  DVT prophylaxis - Lovenox 9.  Dementia 10.  Son inquired about nursing facility.  He has already talked to social services. 11. On isolation for MRSA  Subjective:  Alert, but disoriented.  Taking liquids. Objective:   Filed Vitals:   01/30/13 0925  BP: 145/54  Pulse: 79  Temp:   Resp: 20     Intake/Output from previous day:  10/03 0701 - 10/04 0700 In: 415 [P.O.:240; IV Piggyback:175] Out: 1 [Urine:1]  Intake/Output this shift:      Physical Exam:   General: Elderly WF who is alert.Marland Kitchen    HEENT: Normal. Pupils equal.   Abdomen: Small umbilical hernia.  Soft.  BS present.   Lab Results:    Recent Labs  01/28/13 0550 01/29/13 0343  WBC 25.8* 16.2*  HGB 9.7* 9.9*  HCT 29.0* 29.7*  PLT 208 210    BMET   Recent Labs  01/29/13 0343 01/30/13 0845  NA 137 135  K 3.9 3.9  CL 105 103  CO2 24 23  GLUCOSE 101* 89  BUN 20 12  CREATININE 0.85 0.65  CALCIUM 8.6 9.0    PT/INR   Recent Labs  01/29/13 0343  LABPROT 16.1*  INR 1.32    ABG  No results found for this basename: PHART, PCO2, PO2, HCO3,  in the last 72 hours   Studies/Results:  Nm Hepatobiliary  01/28/2013   CLINICAL DATA:  Elevated liver function tests.  Abdominal pain. Fever. Vomiting.  EXAM: NUCLEAR MEDICINE HEPATOBILIARY IMAGING  TECHNIQUE: Sequential images of the abdomen were obtained out to 60 minutes following intravenous administration of radiopharmaceutical.  COMPARISON:  None.  RADIOPHARMACEUTICALS:  4.7mCi Tc-89m Choletec  FINDINGS: Mildly delayed radiopharmaceutical uptake by the liver is seen, indicating some degree of hepatocellular dysfunction. Liver is otherwise normal in appearance.  There is no biliary excretion of activity, and therefore gallbladder filling could not be assessed. This may be due to hepatocellular dysfunction although biliary obstruction cannot be excluded.  IMPRESSION: Mildly delayed radiopharmaceutical uptake by the liver, consistent with hepatocellular dysfunction.  Absence of biliary excretion of activity, also likely due to hepatocellular dysfunction although biliary obstruction cannot be excluded.   Electronically Signed   By: Myles Rosenthal   On: 01/28/2013 15:43   Ct Abdomen Pelvis W Contrast  01/29/2013   CLINICAL DATA:  Elevated liver function tests. Fever. Diverticulitis. GI bleeding.  EXAM: CT ABDOMEN AND PELVIS WITH CONTRAST  TECHNIQUE: Multidetector CT imaging of the abdomen and pelvis was performed using the standard protocol following bolus administration of intravenous contrast.  CONTRAST:  80mL OMNIPAQUE IOHEXOL 300 MG/ML  SOLN  COMPARISON:  None.  FINDINGS: Images through  the lung bases show small bilateral pleural effusions and mild bibasilar atelectasis.  A moderate size hiatal hernia is demonstrated. Cholelithiasis is demonstrated, however there is no evidence of gallbladder wall thickening or pericholecystic inflammatory changes. No evidence of biliary ductal dilatation.  The liver, pancreas, spleen, adrenal glands, and kidneys are normal in appearance. No soft tissue masses or lymphadenopathy identified within the abdomen or pelvis.  Prior hysterectomy noted. Adnexal regions are unremarkable. Right hip  prosthesis results in beam hardening artifact through the inferior pelvis. Diffuse colonic diverticulosis is noted, however there is no evidence of diverticulitis. No other inflammatory process or abnormal fluid collections are identified.  IMPRESSION: Moderate hiatal hernia.  Cholelithiasis. No radiographic evidence of cholecystitis.  Diverticulosis. No radiographic evidence of diverticulitis.  Small bilateral pleural effusions and bibasilar atelectasis.   Electronically Signed   By: Myles Rosenthal M.D.   On: 01/29/2013 16:10     Anti-infectives:   Anti-infectives   Start     Dose/Rate Route Frequency Ordered Stop   01/28/13 2200  cefTRIAXone (ROCEPHIN) 1 g in dextrose 5 % 50 mL IVPB     1 g 100 mL/hr over 30 Minutes Intravenous Daily at bedtime 01/28/13 0348     01/28/13 2200  azithromycin (ZITHROMAX) 250 mg in dextrose 5 % 125 mL IVPB     250 mg 125 mL/hr over 60 Minutes Intravenous Daily at bedtime 01/28/13 0348     01/28/13 0230  azithromycin (ZITHROMAX) 500 mg in dextrose 5 % 250 mL IVPB     500 mg 250 mL/hr over 60 Minutes Intravenous  Once 01/28/13 0218 01/28/13 0346   01/28/13 0115  cefTRIAXone (ROCEPHIN) 1 g in dextrose 5 % 50 mL IVPB     1 g 100 mL/hr over 30 Minutes Intravenous  Once 01/28/13 0113 01/28/13 0221      Ovidio Kin, MD, FACS Pager: [phone] [facility] Office: [phone] 01/30/2013

## 2013-01-30 NOTE — Progress Notes (Signed)
Covering for Plain GI  Subjective: No acute events.  No complaints of any abdominal pain.  Objective: Vital signs in last 24 hours: Temp:  [98 F (36.7 C)-98.1 F (36.7 C)] 98 F (36.7 C) (10/04 4098) Pulse Rate:  [87-92] 92 (10/04 0623) Resp:  [20] 20 (10/04 0623) BP: (152-164)/(69) 164/69 mmHg (10/04 0623) SpO2:  [94 %-95 %] 95 % (10/04 0623)    Intake/Output from previous day: 10/03 0701 - 10/04 0700 In: 415 [P.O.:240; IV Piggyback:175] Out: 1 [Urine:1] Intake/Output this shift:    General appearance: alert and no distress GI: soft, non-tender; bowel sounds normal; no masses,  no organomegaly  Lab Results:  Recent Labs  01/28/13 0155 01/28/13 0208 01/28/13 0550 01/29/13 0343  WBC 29.4*  --  25.8* 16.2*  HGB 10.6* 10.9* 9.7* 9.9*  HCT 31.1* 32.0* 29.0* 29.7*  PLT 227  --  208 210   BMET  Recent Labs  01/28/13 0155 01/28/13 0208 01/28/13 0550 01/29/13 0343  NA 139 144 138 137  K 3.4* 3.1* 3.8 3.9  CL 102 104 102 105  CO2 26  --  26 24  GLUCOSE 155* 149* 135* 101*  BUN 21 20 22 20   CREATININE 0.91 1.20* 0.92 0.85  CALCIUM 9.1  --  8.6 8.6   LFT  Recent Labs  01/29/13 0343  PROT 7.1  ALBUMIN 2.3*  AST 76*  ALT 78*  ALKPHOS 66  BILITOT 3.4*   PT/INR  Recent Labs  01/29/13 0343  LABPROT 16.1*  INR 1.32   Hepatitis Panel No results found for this basename: HEPBSAG, HCVAB, HEPAIGM, HEPBIGM,  in the last 72 hours C-Diff No results found for this basename: CDIFFTOX,  in the last 72 hours Fecal Lactopherrin No results found for this basename: FECLLACTOFRN,  in the last 72 hours  Studies/Results: Nm Hepatobiliary  01/28/2013   CLINICAL DATA:  Elevated liver function tests. Abdominal pain. Fever. Vomiting.  EXAM: NUCLEAR MEDICINE HEPATOBILIARY IMAGING  TECHNIQUE: Sequential images of the abdomen were obtained out to 60 minutes following intravenous administration of radiopharmaceutical.  COMPARISON:  None.  RADIOPHARMACEUTICALS:  4.26mCi  Tc-15m Choletec  FINDINGS: Mildly delayed radiopharmaceutical uptake by the liver is seen, indicating some degree of hepatocellular dysfunction. Liver is otherwise normal in appearance.  There is no biliary excretion of activity, and therefore gallbladder filling could not be assessed. This may be due to hepatocellular dysfunction although biliary obstruction cannot be excluded.  IMPRESSION: Mildly delayed radiopharmaceutical uptake by the liver, consistent with hepatocellular dysfunction.  Absence of biliary excretion of activity, also likely due to hepatocellular dysfunction although biliary obstruction cannot be excluded.   Electronically Signed   By: Myles Rosenthal   On: 01/28/2013 15:43   US Abdomen Complete  01/28/2013   CLINICAL DATA:  History of elevated liver function tests. History of nausea and vomiting. History of hypertension and congestive heart failure.  EXAM: ULTRASOUND ABDOMEN COMPLETE  COMPARISON:  None.  IMPRESSION: Examination was compromised by patient's inability to cooperate. There is echogenic material within the gallbladder with acoustic shadowing posterior to it felt represent cholelithiasis with details are limited. No gallbladder wall thickening or pericholecystic fluid was evident. Whether there was a positive sonographic Eulah Pont sign could not be established. Common bile duct is normal in caliber with no choledocholithiasis. Question slight dilatation of branching intrahepatic bile ducts. Atherosclerotic plaquing of the abdominal aorta without evidence of aneurysm.  FINDINGS:  THERE IS A NOTE FROM THE TECHNOLOGIST THAT THERE WAS PATIENT CONFUSION.  SOME OF  THE EXAMINATION WAS COMPROMISED BY INABILITY OF PATIENT TO COOPERATE.: FINDINGS: THERE IS A NOTE FROM THE TECHNOLOGIST THAT THERE WAS PATIENT CONFUSION. SOME OF THE EXAMINATION WAS COMPROMISED BY INABILITY OF PATIENT TO COOPERATE. Gallbladder  Detail compromised by inability of patient to cooperate. There is echogenic material within  the gallbladder with acoustic shadowing posterior to it consistent with cholelithiasis. Individual calculi cannot be measured. Gallbladder wall is normal thickness measuring 1.9 mm. There is a note from the technologist that the patient would not let pressure be put on the gallbladder region so the [facility] sign could not be evaluated. No pericholecystic fluid was identified.  Common bile duct  Diameter: Common bile duct measures 3.5 mm. Question slight intrahepatic biliary dilatation. No choledocholithiasis is seen but the examination was somewhat limited.  Liver  No focal lesion identified. Within normal limits in parenchymal echogenicity.  IVC  No abnormality visualized.  Pancreas  Visualized portion unremarkable.  Spleen  Size and appearance within normal limits.  Length is 6 cm.  Right Kidney  Length: Right renal length measured 10.1 cm. Echogenicity within normal limits. No mass or hydronephrosis visualized. Examination was somewhat limited.  Left Kidney  Length: Left renal length is 10.7 cm. Echogenicity within normal limits. No mass or hydronephrosis visualized.  Abdominal aorta  No aneurysm visualized. Maximum diameter was 1.8 cm. Most proximal area could not be evaluated. There is some atherosclerotic plaquing.   Electronically Signed   By: Onalee Hua  Call M.Perkins.   On: 01/28/2013 09:02   Ct Abdomen Pelvis W Contrast  01/29/2013   CLINICAL DATA:  Elevated liver function tests. Fever. Diverticulitis. GI bleeding.  EXAM: CT ABDOMEN AND PELVIS WITH CONTRAST  TECHNIQUE: Multidetector CT imaging of the abdomen and pelvis was performed using the standard protocol following bolus administration of intravenous contrast.  CONTRAST:  80mL OMNIPAQUE IOHEXOL 300 MG/ML  SOLN  COMPARISON:  None.  FINDINGS: Images through the lung bases show small bilateral pleural effusions and mild bibasilar atelectasis.  A moderate size hiatal hernia is demonstrated. Cholelithiasis is demonstrated, however there is no evidence of  gallbladder wall thickening or pericholecystic inflammatory changes. No evidence of biliary ductal dilatation.  The liver, pancreas, spleen, adrenal glands, and kidneys are normal in appearance. No soft tissue masses or lymphadenopathy identified within the abdomen or pelvis.  Prior hysterectomy noted. Adnexal regions are unremarkable. Right hip prosthesis results in beam hardening artifact through the inferior pelvis. Diffuse colonic diverticulosis is noted, however there is no evidence of diverticulitis. No other inflammatory process or abnormal fluid collections are identified.  IMPRESSION: Moderate hiatal hernia.  Cholelithiasis. No radiographic evidence of cholecystitis.  Diverticulosis. No radiographic evidence of diverticulitis.  Small bilateral pleural effusions and bibasilar atelectasis.   Electronically Signed   By: Myles Rosenthal M.Perkins.   On: 01/29/2013 16:10    Medications:  Scheduled: . amLODipine  5 mg Oral Daily  . azithromycin  250 mg Intravenous QHS  . cefTRIAXone (ROCEPHIN)  IV  1 g Intravenous QHS  . enoxaparin (LOVENOX) injection  40 mg Subcutaneous Q24H  . ferrous sulfate  325 mg Oral Q breakfast  . furosemide  20 mg Oral Daily  . pantoprazole  40 mg Oral Daily  . potassium chloride SA  20 mEq Oral BID  . sodium chloride  3 mL Intravenous Q12H   Continuous:   Assessment/Plan: 1) Abnormal liver enzymes. 2) ? Cholecystitis. 3) Cholelithiasis. 4) Leukocystosis.   The CT scan is negative for cholecystitis or choledocholithiasis.  Her liver enzymes  are pending at this time as well as her WBC.  HIDA suggests hepatocellular dysfunction rather than CBD obstruction.  Ultrasound and CT scan show a normal caliber CBD.  Plan: 1) Await blood work for this AM. 2) Continue with antibiotics. 3) Hold on any GI intervention at this time.  LOS: 2 days   Sarah Perkins 01/30/2013, 8:03 AM

## 2013-01-30 NOTE — Progress Notes (Signed)
Sarah Perkins:[id number] DOB: 1917/11/02 DOA: 01/28/2013 PCP: Thayer Headings, MD  Brief narrative: 79 year old Caucasian female admitted 01/28/2013 with fever leukocytosis and abdominal pain-initially thought to be urinary tract infection however ultrasound abdomen showed potential cholelithiasis. HIDA scan ordered 10/2 was equivocal and CT scan ordered 10/3 showed no further gallstones. Patient's LFTs trended downwards and general surgery as well as gastroenterology are involved in her care Cardiology consult requested by general surgery for cardiac clearance   Past medical history-As per Problem list Chart reviewed as below-  Consultants:  GI-Pyrtle  Gen surg-Toth  Cardiology-Ganji  Procedures:  See procedure  Antibiotics:  Azithromycin-10/1  Ceftriaxone-10/1   Subjective  Well.  tolerating diet now with clear no abd pain No n/v/cp A little confused, but this seems    Objective    Interim History: none  Telemetry: Likely PVCs with sinus arrhythmia  Objective: Filed Vitals:   01/29/13 2029 01/30/13 0623 01/30/13 0925 01/30/13 1422  BP: 152/69 164/69 145/54 155/63  Pulse: 87 92 79 81  Temp: 98.1 F (36.7 C) 98 F (36.7 C)  98 F (36.7 C)  TempSrc: Oral Oral  Oral  Resp: 20 20 20 20   Height:      Weight:      SpO2: 94% 95% 94% 95%    Intake/Output Summary (Last 24 hours) at 01/30/13 1614 Last data filed at 01/30/13 0900  Gross per 24 hour  Intake    535 ml  Output      1 ml  Net    534 ml    Exam:  General: EOmi, NCAt Cardiovascular: s1 s 2, ? Soft M LUSE Respiratory: clear Abdomen: nt,nd-Umbilical hernia   Data Reviewed: Basic Metabolic Panel:  Recent Labs Lab 01/28/13 0155 01/28/13 0208 01/28/13 0550 01/29/13 0343 01/30/13 0845  NA 139 144 138 137 135  K 3.4* 3.1* 3.8 3.9 3.9  CL 102 104 102 105 103  CO2 26  --  26 24 23   GLUCOSE 155* 149* 135* 101* 89  BUN 21 20 22 20 12   CREATININE 0.91 1.20* 0.92 0.85 0.65    CALCIUM 9.1  --  8.6 8.6 9.0   Liver Function Tests:  Recent Labs Lab 01/28/13 0155 01/28/13 0550 01/29/13 0343 01/30/13 0845  AST 208* 159* 76* 36  ALT 131* 113* 78* 52*  ALKPHOS 66 59 66 91  BILITOT 2.4* 2.6* 3.4* 2.3*  PROT 7.9 6.9 7.1 7.5  ALBUMIN 2.7* 2.3* 2.3* 2.2*    Recent Labs Lab 01/28/13 0155  LIPASE 6*   No results found for this basename: AMMONIA,  in the last 168 hours CBC:  Recent Labs Lab 01/28/13 0155 01/28/13 0208 01/28/13 0550 01/29/13 0343  WBC 29.4*  --  25.8* 16.2*  NEUTROABS  --   --   --  12.8*  HGB 10.6* 10.9* 9.7* 9.9*  HCT 31.1* 32.0* 29.0* 29.7*  MCV 90.1  --  90.3 91.1  PLT 227  --  208 210   Cardiac Enzymes: No results found for this basename: CKTOTAL, CKMB, CKMBINDEX, TROPONINI,  in the last 168 hours BNP: No components found with this basename: POCBNP,  CBG: No results found for this basename: GLUCAP,  in the last 168 hours  Recent Results (from the past 240 hour(s))  MRSA PCR SCREENING     Status: None   Collection Time    01/29/13 12:03 PM      Result Value Range Status   MRSA by PCR NEGATIVE  NEGATIVE Final  Comment:            The GeneXpert MRSA Assay (FDA     approved for NASAL specimens     only), is one component of a     comprehensive MRSA colonization     surveillance program. It is not     intended to diagnose MRSA     infection nor to guide or     monitor treatment for     MRSA infections.     Performed at [facility]     Studies:              All Imaging reviewed and is as per above notation   Scheduled Meds: . amLODipine  5 mg Oral Daily  . azithromycin  250 mg Intravenous QHS  . cefTRIAXone (ROCEPHIN)  IV  1 g Intravenous QHS  . enoxaparin (LOVENOX) injection  40 mg Subcutaneous Q24H  . ferrous sulfate  325 mg Oral Q breakfast  . furosemide  20 mg Oral Daily  . pantoprazole  40 mg Oral Daily  . potassium chloride SA  20 mEq Oral BID  . sodium chloride  3 mL Intravenous Q12H    Continuous Infusions:    Assessment/Plan: 1. Sepsis unclear etiology, ? Cholecystitis vs Pyelonephritis-[doubt that this is acute ascending cholangitis] as patient is clinically much improved from admission. Defer further decision making to gastric neurology and Gen. Surgery-appreciate input.  Appreciate cardiology input for medical clearance-given thought process is now less likely Cholecystitis, will transition to PO Bactrim DS for another 7 days to complete what should be adequate pyelo coverage and will permit soft diet 2. Hypertension-moderately well-controlled blood pressure is controlled on amlodipine 5 mg daily 3. PVCs sinus arrhythmia-stable  Code Status: Full Family Communication: Long discussion with son at bedside Disposition Plan: Inpatient   Pleas Koch, MD  [facility] Pager [phone] 01/30/2013, 4:14 PM    LOS: 2 days

## 2013-01-30 NOTE — Progress Notes (Signed)
Provided Pt with bed offers.    CSW attempted to notify Pt's son, as Pt felt that this was a good idea.  Pt's son's cell number had a message stating that the voicemail hasn't been set up.  Pt's son's home number rang endlessly.  Providence Crosby, LCSWA Clinical Social Work [phone]

## 2013-01-30 NOTE — Progress Notes (Signed)
Return call from Pt's son.  Alerted Mr. Szczerba to the bed offer list in Pt's room.  He was curious about [facility], however they didn't offer a bed.  Mr. Giammarco to review the list with his mother and have a decision by Monday; he's aware that d/c is likely on Monday.  Providence Crosby, LCSWA Clinical Social Work [phone]

## 2013-01-31 DIAGNOSIS — D7289 Other specified disorders of white blood cells: Secondary | ICD-10-CM | POA: Diagnosis not present

## 2013-01-31 DIAGNOSIS — K8020 Calculus of gallbladder without cholecystitis without obstruction: Secondary | ICD-10-CM | POA: Diagnosis not present

## 2013-01-31 DIAGNOSIS — R112 Nausea with vomiting, unspecified: Secondary | ICD-10-CM | POA: Diagnosis not present

## 2013-01-31 NOTE — Progress Notes (Signed)
General Surgery Note  LOS: 3 days   Assessment/Plan: 1.  Hepatocellular dysfuncton (possibly due to acute infection)  T. Bili - 2.3, ALT - 52 - 01/30/2013  CT scan - 01/29/2013 - shows no biliary dilitation, HIDA scan - 01/28/2013 - shows possible hepatocellular dysfunction  WBC - 16,200 - 01/29/2013  On Zithromax and Rocephin  No evidence of acute cholecystitis or cholangitis.  No reason for surgery.  I talked to son, Leonette Most, and he is not interested in her having any surgery.  No change from yesterday - will sign off as there are no acute surgical problems.  2.  Cholelithiasis 3.  Abnormal LFT's - improving 4.  Moderate HH seen on CT scan - 01/29/2013 5.  Hypertension 6.  Cardiac issues  Seen by Dr. Jeanella Cara who does not see her as excessive risk for surgery (for her age!) 25.  Malnourished   Alb - 2.2 - 01/30/2013 8.  DVT prophylaxis - Lovenox 9.  Dementia 10.  Son inquired about nursing facility.  He has already talked to social services. 11. On isolation for MRSA  Subjective:  Alert.  Ate breakfast this AM.  Her son, Leonette Most, is in the room.  She recognizes him today. Objective:   Filed Vitals:   01/31/13 1024  BP: 143/64  Pulse:   Temp:   Resp:      Intake/Output from previous day:  10/04 0701 - 10/05 0700 In: 360 [P.O.:360] Out: -   Intake/Output this shift:      Physical Exam:   General: Elderly WF who is alert.Marland Kitchen    HEENT: Normal. Pupils equal.   Abdomen: Small umbilical hernia.  Soft.  BS present.   Lab Results:     Recent Labs  01/29/13 0343  WBC 16.2*  HGB 9.9*  HCT 29.7*  PLT 210    BMET    Recent Labs  01/29/13 0343 01/30/13 0845  NA 137 135  K 3.9 3.9  CL 105 103  CO2 24 23  GLUCOSE 101* 89  BUN 20 12  CREATININE 0.85 0.65  CALCIUM 8.6 9.0    PT/INR    Recent Labs  01/29/13 0343  LABPROT 16.1*  INR 1.32    ABG  No results found for this basename: PHART, PCO2, PO2, HCO3,  in the last 72 hours   Studies/Results:  Ct  Abdomen Pelvis W Contrast  01/29/2013   CLINICAL DATA:  Elevated liver function tests. Fever. Diverticulitis. GI bleeding.  EXAM: CT ABDOMEN AND PELVIS WITH CONTRAST  TECHNIQUE: Multidetector CT imaging of the abdomen and pelvis was performed using the standard protocol following bolus administration of intravenous contrast.  CONTRAST:  80mL OMNIPAQUE IOHEXOL 300 MG/ML  SOLN  COMPARISON:  None.  FINDINGS: Images through the lung bases show small bilateral pleural effusions and mild bibasilar atelectasis.  A moderate size hiatal hernia is demonstrated. Cholelithiasis is demonstrated, however there is no evidence of gallbladder wall thickening or pericholecystic inflammatory changes. No evidence of biliary ductal dilatation.  The liver, pancreas, spleen, adrenal glands, and kidneys are normal in appearance. No soft tissue masses or lymphadenopathy identified within the abdomen or pelvis.  Prior hysterectomy noted. Adnexal regions are unremarkable. Right hip prosthesis results in beam hardening artifact through the inferior pelvis. Diffuse colonic diverticulosis is noted, however there is no evidence of diverticulitis. No other inflammatory process or abnormal fluid collections are identified.  IMPRESSION: Moderate hiatal hernia.  Cholelithiasis. No radiographic evidence of cholecystitis.  Diverticulosis. No radiographic evidence of diverticulitis.  Small bilateral pleural effusions and bibasilar atelectasis.   Electronically Signed   By: Myles Rosenthal M.D.   On: 01/29/2013 16:10     Anti-infectives:   Anti-infectives   Start     Dose/Rate Route Frequency Ordered Stop   01/28/13 2200  cefTRIAXone (ROCEPHIN) 1 g in dextrose 5 % 50 mL IVPB     1 g 100 mL/hr over 30 Minutes Intravenous Daily at bedtime 01/28/13 0348     01/28/13 2200  azithromycin (ZITHROMAX) 250 mg in dextrose 5 % 125 mL IVPB     250 mg 125 mL/hr over 60 Minutes Intravenous Daily at bedtime 01/28/13 0348     01/28/13 0230  azithromycin  (ZITHROMAX) 500 mg in dextrose 5 % 250 mL IVPB     500 mg 250 mL/hr over 60 Minutes Intravenous  Once 01/28/13 0218 01/28/13 0346   01/28/13 0115  cefTRIAXone (ROCEPHIN) 1 g in dextrose 5 % 50 mL IVPB     1 g 100 mL/hr over 30 Minutes Intravenous  Once 01/28/13 0113 01/28/13 0221      Ovidio Kin, MD, FACS Pager: [phone] [facility] Office: [phone] 01/31/2013

## 2013-01-31 NOTE — Progress Notes (Signed)
Subjective: No acute events.  No complaints of abdominal pain.  Objective: Vital signs in last 24 hours: Temp:  [98 F (36.7 C)-98.3 F (36.8 C)] 98.3 F (36.8 C) (10/05 0553) Pulse Rate:  [79-89] 89 (10/05 0553) Resp:  [18-20] 18 (10/05 0553) BP: (137-155)/(54-63) 138/60 mmHg (10/05 0553) SpO2:  [94 %-98 %] 98 % (10/05 0553)    Intake/Output from previous day: 10/04 0701 - 10/05 0700 In: 360 [P.O.:360] Out: -  Intake/Output this shift:    General appearance: alert and no distress GI: soft, non-tender; bowel sounds normal; no masses,  no organomegaly  Lab Results:  Recent Labs  01/29/13 0343  WBC 16.2*  HGB 9.9*  HCT 29.7*  PLT 210   BMET  Recent Labs  01/29/13 0343 01/30/13 0845  NA 137 135  K 3.9 3.9  CL 105 103  CO2 24 23  GLUCOSE 101* 89  BUN 20 12  CREATININE 0.85 0.65  CALCIUM 8.6 9.0   LFT  Recent Labs  01/30/13 0845  PROT 7.5  ALBUMIN 2.2*  AST 36  ALT 52*  ALKPHOS 91  BILITOT 2.3*   PT/INR  Recent Labs  01/29/13 0343  LABPROT 16.1*  INR 1.32   Hepatitis Panel No results found for this basename: HEPBSAG, HCVAB, HEPAIGM, HEPBIGM,  in the last 72 hours C-Diff No results found for this basename: CDIFFTOX,  in the last 72 hours Fecal Lactopherrin No results found for this basename: FECLLACTOFRN,  in the last 72 hours  Studies/Results: Ct Abdomen Pelvis W Contrast  01/29/2013   CLINICAL DATA:  Elevated liver function tests. Fever. Diverticulitis. GI bleeding.  EXAM: CT ABDOMEN AND PELVIS WITH CONTRAST  TECHNIQUE: Multidetector CT imaging of the abdomen and pelvis was performed using the standard protocol following bolus administration of intravenous contrast.  CONTRAST:  80mL OMNIPAQUE IOHEXOL 300 MG/ML  SOLN  COMPARISON:  None.  FINDINGS: Images through the lung bases show small bilateral pleural effusions and mild bibasilar atelectasis.  A moderate size hiatal hernia is demonstrated. Cholelithiasis is demonstrated, however there is  no evidence of gallbladder wall thickening or pericholecystic inflammatory changes. No evidence of biliary ductal dilatation.  The liver, pancreas, spleen, adrenal glands, and kidneys are normal in appearance. No soft tissue masses or lymphadenopathy identified within the abdomen or pelvis.  Prior hysterectomy noted. Adnexal regions are unremarkable. Right hip prosthesis results in beam hardening artifact through the inferior pelvis. Diffuse colonic diverticulosis is noted, however there is no evidence of diverticulitis. No other inflammatory process or abnormal fluid collections are identified.  IMPRESSION: Moderate hiatal hernia.  Cholelithiasis. No radiographic evidence of cholecystitis.  Diverticulosis. No radiographic evidence of diverticulitis.  Small bilateral pleural effusions and bibasilar atelectasis.   Electronically Signed   By: Myles Rosenthal M.D.   On: 01/29/2013 16:10    Medications:  Scheduled: . amLODipine  5 mg Oral Daily  . azithromycin  250 mg Intravenous QHS  . cefTRIAXone (ROCEPHIN)  IV  1 g Intravenous QHS  . enoxaparin (LOVENOX) injection  40 mg Subcutaneous Q24H  . ferrous sulfate  325 mg Oral Q breakfast  . furosemide  20 mg Oral Daily  . pantoprazole  40 mg Oral Daily  . potassium chloride SA  20 mEq Oral BID  . sodium chloride  3 mL Intravenous Q12H   Continuous:   Assessment/Plan: 1) Cholelithiasis. 2) Resolved RUQ pain.   No new labs, but clinically she appears well.  Per Dr. Allene Pyo note her son does not want her  to have surgery.  Scans are negative for any evidence of choledocholithiasis or cholecystitis.  ? Source of pain and infection.  Plan: 1) Follow CBC. 2) No GI intervention at this time.   LOS: 3 days   Sarah Perkins D 01/31/2013, 8:27 AM

## 2013-01-31 NOTE — Progress Notes (Signed)
Sarah Perkins ZOX:[id number] DOB: 25-Dec-1917 DOA: 01/28/2013 PCP: Thayer Headings, MD  Brief narrative: 79 year old Caucasian female admitted 01/28/2013 with fever leukocytosis and abdominal pain-initially thought to be urinary tract infection however ultrasound abdomen showed potential cholelithiasis. HIDA scan ordered 10/2 was equivocal and CT scan ordered 10/3 showed no further gallstones. Patient's LFTs trended downwards and general surgery as well as gastroenterology are involved in her care Cardiology consult requested by general surgery for cardiac clearance   Past medical history-As per Problem list Chart reviewed as below-  Consultants:  GI-Pyrtle  Gen surg-Toth  Cardiology-Ganji  Procedures:  See procedure  Antibiotics:  Azithromycin-10/1  Ceftriaxone-10/1   Subjective  Alert pleasant oriented son at bedside Eating and drinking well without abdominal pain nausea vomiting chest pain. Tolerating diet without any other issue No burning in the urine or other complaints    Objective    Interim History: none  Telemetry: Likely PVCs with sinus arrhythmia  Objective: Filed Vitals:   01/30/13 2101 01/31/13 0553 01/31/13 1024 01/31/13 1324  BP: 137/55 138/60 143/64 147/56  Pulse: 88 89  76  Temp: 98.1 F (36.7 C) 98.3 F (36.8 C)  98.3 F (36.8 C)  TempSrc: Oral Oral  Oral  Resp: 19 18  18   Height:      Weight:      SpO2: 94% 98%  95%    Intake/Output Summary (Last 24 hours) at 01/31/13 1645 Last data filed at 01/31/13 1500  Gross per 24 hour  Intake    180 ml  Output      0 ml  Net    180 ml    Exam:  General: EOmi, NCAt Cardiovascular: s1 s 2, ? Soft M LUSE Respiratory: clear Abdomen: nt,nd-Umbilical hernia   Data Reviewed: Basic Metabolic Panel:  Recent Labs Lab 01/28/13 0155 01/28/13 0208 01/28/13 0550 01/29/13 0343 01/30/13 0845  NA 139 144 138 137 135  K 3.4* 3.1* 3.8 3.9 3.9  CL 102 104 102 105 103  CO2 26  --   26 24 23   GLUCOSE 155* 149* 135* 101* 89  BUN 21 20 22 20 12   CREATININE 0.91 1.20* 0.92 0.85 0.65  CALCIUM 9.1  --  8.6 8.6 9.0   Liver Function Tests:  Recent Labs Lab 01/28/13 0155 01/28/13 0550 01/29/13 0343 01/30/13 0845  AST 208* 159* 76* 36  ALT 131* 113* 78* 52*  ALKPHOS 66 59 66 91  BILITOT 2.4* 2.6* 3.4* 2.3*  PROT 7.9 6.9 7.1 7.5  ALBUMIN 2.7* 2.3* 2.3* 2.2*    Recent Labs Lab 01/28/13 0155  LIPASE 6*   No results found for this basename: AMMONIA,  in the last 168 hours CBC:  Recent Labs Lab 01/28/13 0155 01/28/13 0208 01/28/13 0550 01/29/13 0343  WBC 29.4*  --  25.8* 16.2*  NEUTROABS  --   --   --  12.8*  HGB 10.6* 10.9* 9.7* 9.9*  HCT 31.1* 32.0* 29.0* 29.7*  MCV 90.1  --  90.3 91.1  PLT 227  --  208 210   Cardiac Enzymes: No results found for this basename: CKTOTAL, CKMB, CKMBINDEX, TROPONINI,  in the last 168 hours BNP: No components found with this basename: POCBNP,  CBG: No results found for this basename: GLUCAP,  in the last 168 hours  Recent Results (from the past 240 hour(s))  MRSA PCR SCREENING     Status: None   Collection Time    01/29/13 12:03 PM      Result Value Range  Status   MRSA by PCR NEGATIVE  NEGATIVE Final   Comment:            The GeneXpert MRSA Assay (FDA     approved for NASAL specimens     only), is one component of a     comprehensive MRSA colonization     surveillance program. It is not     intended to diagnose MRSA     infection nor to guide or     monitor treatment for     MRSA infections.     Performed at [facility]     Studies:              All Imaging reviewed and is as per above notation   Scheduled Meds: . amLODipine  5 mg Oral Daily  . azithromycin  250 mg Intravenous QHS  . cefTRIAXone (ROCEPHIN)  IV  1 g Intravenous QHS  . enoxaparin (LOVENOX) injection  40 mg Subcutaneous Q24H  . ferrous sulfate  325 mg Oral Q breakfast  . furosemide  20 mg Oral Daily  . pantoprazole  40 mg Oral  Daily  . potassium chloride SA  20 mEq Oral BID  . sodium chloride  3 mL Intravenous Q12H   Continuous Infusions:    Assessment/Plan: 1. Sepsis unclear etiology, ? Cholecystitis vs Pyelonephritis-[doubt that this is acute ascending cholangitis] as patient is clinically much improved from admission. Defer further decision making to gastric neurology and Gen. Surgery-appreciate input.  Appreciate cardiology input for medical clearance-given thought process is now less likely Cholecystitis, will transition to PO Bactrim DS for another 7 days to complete what should be adequate pyelo coverage and have graduated to a full diet--her LFTs have completely trended downwards but she does have a mildly elevated bilirubin-it is not clear what the inciting etiology was. 2. Leukocytosis-we'll repeat CBC in 10/5/a.m. 3. Hypertension-moderately well-controlled blood pressure is controlled on amlodipine 5 mg daily 4. PVCs sinus arrhythmia-stable  Code Status: Full Family Communication: Long discussion with son at bedside Disposition Plan: Inpatient-patient probably can discharge 01/31/13 a.m.   Pleas Koch, MD  [facility] Pager [phone] 01/31/2013, 4:45 PM    LOS: 3 days

## 2013-02-01 DIAGNOSIS — N390 Urinary tract infection, site not specified: Secondary | ICD-10-CM | POA: Diagnosis not present

## 2013-02-01 DIAGNOSIS — I509 Heart failure, unspecified: Secondary | ICD-10-CM | POA: Diagnosis not present

## 2013-02-01 DIAGNOSIS — F0390 Unspecified dementia without behavioral disturbance: Secondary | ICD-10-CM | POA: Diagnosis not present

## 2013-02-01 DIAGNOSIS — Z5189 Encounter for other specified aftercare: Secondary | ICD-10-CM | POA: Diagnosis not present

## 2013-02-01 DIAGNOSIS — A419 Sepsis, unspecified organism: Secondary | ICD-10-CM | POA: Diagnosis not present

## 2013-02-01 DIAGNOSIS — R7989 Other specified abnormal findings of blood chemistry: Secondary | ICD-10-CM | POA: Diagnosis not present

## 2013-02-01 DIAGNOSIS — M6281 Muscle weakness (generalized): Secondary | ICD-10-CM | POA: Diagnosis not present

## 2013-02-01 DIAGNOSIS — I10 Essential (primary) hypertension: Secondary | ICD-10-CM | POA: Diagnosis not present

## 2013-02-01 DIAGNOSIS — N12 Tubulo-interstitial nephritis, not specified as acute or chronic: Secondary | ICD-10-CM | POA: Diagnosis not present

## 2013-02-01 DIAGNOSIS — R279 Unspecified lack of coordination: Secondary | ICD-10-CM | POA: Diagnosis not present

## 2013-02-01 DIAGNOSIS — R5381 Other malaise: Secondary | ICD-10-CM | POA: Diagnosis not present

## 2013-02-01 DIAGNOSIS — K8000 Calculus of gallbladder with acute cholecystitis without obstruction: Secondary | ICD-10-CM | POA: Diagnosis not present

## 2013-02-01 DIAGNOSIS — R112 Nausea with vomiting, unspecified: Secondary | ICD-10-CM | POA: Diagnosis not present

## 2013-02-01 DIAGNOSIS — E46 Unspecified protein-calorie malnutrition: Secondary | ICD-10-CM | POA: Diagnosis not present

## 2013-02-01 LAB — CBC WITH DIFFERENTIAL/PLATELET
Eosinophils Absolute: 0.3 10*3/uL (ref 0.0–0.7)
Eosinophils Relative: 3 % (ref 0–5)
HCT: 30 % — ABNORMAL LOW (ref 36.0–46.0)
Lymphs Abs: 2.1 10*3/uL (ref 0.7–4.0)
MCH: 30.2 pg (ref 26.0–34.0)
MCV: 88.8 fL (ref 78.0–100.0)
Monocytes Absolute: 1.2 10*3/uL — ABNORMAL HIGH (ref 0.1–1.0)
Monocytes Relative: 15 % — ABNORMAL HIGH (ref 3–12)
Neutrophils Relative %: 55 % (ref 43–77)
WBC: 7.9 10*3/uL (ref 4.0–10.5)

## 2013-02-01 MED ORDER — CEFUROXIME AXETIL 500 MG PO TABS
500.0000 mg | ORAL_TABLET | Freq: Two times a day (BID) | ORAL | Status: DC
  Administered 2013-02-01: 500 mg via ORAL
  Filled 2013-02-01 (×3): qty 1

## 2013-02-01 MED ORDER — CEFUROXIME AXETIL 500 MG PO TABS: 500.0000 mg | ORAL_TABLET | Freq: Two times a day (BID) | ORAL | Status: DC

## 2013-02-01 NOTE — Progress Notes (Signed)
Pt discharged to [facility] via EMS[facility]. Pt was alert and stable at time of discharge. Pt son present and accompanied mother to facility.

## 2013-02-01 NOTE — Progress Notes (Signed)
[facility] Progress Note  Subjective:  Feels ok, but tired.  No complaints of abdominal pain.  Objective:  Vital signs in last 24 hours: Temp:  [98 F (36.7 C)-98.3 F (36.8 C)] 98 F (36.7 C) (10/06 0401) Pulse Rate:  [76-87] 79 (10/06 0401) Resp:  [18] 18 (10/06 0401) BP: (129-147)/(45-64) 129/50 mmHg (10/06 0401) SpO2:  [93 %-95 %] 93 % (10/06 0401) Last BM Date: 01/31/13 General:   Alert, frail, in NAD; confused at times. Heart:  Regular rate and rhythm Pulm:  CTAB.  No W/R/R. Abdomen:  Soft, non-tender and non-distended.  Bowel sounds present. Extremities:  Without edema. Neurologic:  Grossly normal neurologically. Psych:  Alert and cooperative. Normal mood and affect.  Intake/Output from previous day: 10/05 0701 - 10/06 0700 In: 300 [P.O.:300] Out: 4 [Urine:4]  Lab Results:  Recent Labs  02/01/13 0435  WBC 7.9  HGB 10.2*  HCT 30.0*  PLT 256   BMET  Recent Labs  01/30/13 0845  NA 135  K 3.9  CL 103  CO2 23  GLUCOSE 89  BUN 12  CREATININE 0.65  CALCIUM 9.0   LFT  Recent Labs  01/30/13 0845  PROT 7.5  ALBUMIN 2.2*  AST 36  ALT 52*  ALKPHOS 91  BILITOT 2.3*   Assessment / Plan: 1) Cholelithiasis.  2) Resolved RUQ pain and fever, which was biliary sepsis picture on presentation.  Turned around quickly with antibiotics.  *No GI procedures needed at this time.  She will likely be discharged to SNF later today.    LOS: 4 days   ZEHR, JESSICA D.  02/01/2013, 8:21 AM  Pager number [phone]  I have personally taken an interval history, reviewed the chart, and examined the patient.  I agree with the extender's note, impression and recommendations.  Would treat expectantly at this point. Should she develop signs/symptoms of cholangitis again would consider ERCP/sphincterotomy..  Signing off.  Barbette Hair. Arlyce Dice, MD, [facility] [phone]

## 2013-02-01 NOTE — Progress Notes (Signed)
Physical Therapy Treatment Patient Details Name: MAGON CROSON MRN: [id number] DOB: 12-01-1917 Today's Date: 02/01/2013 Time: 4098-1191 PT Time Calculation (min): 23 min  PT Assessment / Plan / Recommendation  History of Present Illness The patient is a 79 year old white female Who lives at home with her son. Apparently she had been running fevers at home. She states that this started in the last couple days. She notes couple episodes of nausea and vomiting. She was brought to the emergency department where an ultrasound was suggestive of stones but did not see any gallbladder wall thickening. Her liver functions were elevated. Since coming to the hospital she is starting to feel better   PT Comments   **pt pivoted x 3 with +2 total assist (pt 30%) required due to heavy posterior lean*  Follow Up Recommendations  SNF     Does the patient have the potential to tolerate intense rehabilitation     Barriers to Discharge        Equipment Recommendations  None recommended by PT    Recommendations for Other Services OT consult  Frequency Min 3X/week   Progress towards PT Goals Progress towards PT goals: Progressing toward goals  Plan Current plan remains appropriate    Precautions / Restrictions Precautions Precautions: Fall Precaution Comments: posterior lean Restrictions Weight Bearing Restrictions: No   Pertinent Vitals/Pain *pt denied pain**    Mobility  Bed Mobility Bed Mobility: Supine to Sit Supine to Sit: 1: +2 Total assist;With rails Supine to Sit: Patient Percentage: 20% Details for Bed Mobility Assistance: assist to advance BLEs and to raise trunk Transfers Transfers: Stand Pivot Transfers Sit to Stand: From bed;With upper extremity assist;1: +2 Total assist Sit to Stand: Patient Percentage: 30% Stand to Sit: To chair/3-in-1;With upper extremity assist;1: +2 Total assist;With armrests Stand to Sit: Patient Percentage: 30% Stand Pivot Transfers: 1: +2 Total  assist Stand Pivot Transfers: Patient Percentage: 30% Details for Transfer Assistance: assist to rise and to steady 2* posterior lean; verbal and manual cues for SPT; SPT performed x 3 (bed to recliner to 3 in 1 to recliner) 2* pt had a BM Ambulation/Gait Ambulation/Gait Assistance: Not tested (comment)    Exercises General Exercises - Lower Extremity Long Arc QuadBarbaraann Boys;Both;10 reps;Seated   PT Diagnosis:    PT Problem List:   PT Treatment Interventions:     PT Goals (current goals can now be found in the care plan section) Acute Rehab PT Goals Patient Stated Goal: to get moving PT Goal Formulation: With patient Time For Goal Achievement: 02/12/13 Potential to Achieve Goals: Good  Visit Information  Last PT Received On: 02/01/13 Assistance Needed: +2 History of Present Illness: The patient is a 79 year old white female Who lives at home with her son. Apparently she had been running fevers at home. She states that this started in the last couple days. She notes couple episodes of nausea and vomiting. She was brought to the emergency department where an ultrasound was suggestive of stones but did not see any gallbladder wall thickening. Her liver functions were elevated. Since coming to the hospital she is starting to feel better    Subjective Data  Patient Stated Goal: to get moving   Cognition  Cognition Arousal/Alertness: Awake/alert Behavior During Therapy: WFL for tasks assessed/performed Overall Cognitive Status: History of cognitive impairments - at baseline Pt is pleasantly confused, not oriented to location    Balance  Balance Balance Assessed: Yes Static Sitting Balance Static Sitting - Level of  Assistance: 3: Mod assist;4: Min assist Static Sitting - Comment/# of Minutes: 1 minute with min to mod assist 2* posterior lean, pt able to lean forward with verbal and manual cues  End of Session PT - End of Session Equipment Utilized During Treatment: Gait belt Activity  Tolerance: Patient tolerated treatment well Patient left: in chair;with call bell/phone within reach;with chair alarm set Nurse Communication: Mobility status   GP     Ralene Bathe Kistler 02/01/2013, 11:17 AM [phone]

## 2013-02-01 NOTE — Discharge Summary (Addendum)
Physician Discharge Summary  Sarah Perkins WUJ:[id number] DOB: [birth year] DOA: 01/28/2013  PCP: Thayer Headings, MD  Admit date: 01/28/2013 Discharge date: 02/01/2013  Time spent: 40 minutes  Recommendations for Outpatient Follow-up:  1. Repeat basic metabolic and complete metabolic panels as well as a CBC may be in about a week 2. Complete Ceftin 500 twice a day on 02/03/13  3. Recommend Geri psych assessment possible at nursing facility-she has been very stable and pleasant here 4. Recommend eventual goals of care with the son-patient will need followup discussion regarding CODE STATUS as well as whether to or not hospitalize patient in the future 5. Consider therapy services as has some chronic R hip pain 2/2 to femoral neck # 2011  Discharge Diagnoses:  Principal Problem:   Fever Active Problems:   HTN (hypertension)   CKD (chronic kidney disease), stage I   GERD (gastroesophageal reflux disease)   Nausea & vomiting   Generalized weakness   Foul smelling urine   Elevated LFTs   Dementia   Discharge Condition: Fair  Diet recommendation: Regular  Filed Weights   01/28/13 0342  Weight: 63.5 kg (139 lb 15.9 oz)    History of present illness:  79 year old Caucasian female admitted 01/28/2013 with fever leukocytosis and abdominal pain-initially thought to be urinary tract infection however ultrasound abdomen showed potential cholelithiasis. HIDA scan ordered 10/2 was equivocal and CT scan ordered 10/3 showed no further gallstones.  Patient's LFTs trended downwards and general surgery as well as gastroenterology are involved in her care  Cardiology consult requested by general surgery for cardiac clearance  It was eventually felt that the patient did not have cholecystitis or cholangitis and instead was thought to have just pyelonephritis please see below for full hospital report  Hospital Course:   1. Sepsis unclear etiology, ? Cholecystitis vs Pyelonephritis-final  diagnosis potentially biliary sepsis-[doubt that this was acute ascending cholangitis] as patient is clinically much improved from admission. Defer further decision making to gastric neurology and Gen. Surgery-appreciate input. Appreciate cardiology input for medical clearance-given thought process is now less likely Cholecystitis, will transition to PO Ceftin days to complete what should be adequate pyelo coverage and have graduated to a full diet--her LFTs have completely trended downwards but she does have a mildly elevated bilirubin-it is not clear what the inciting etiology was. 2. Elevated LFTs-? Hepatocellular disease-deferred workup per discussion as per above. She is 66 years old and son does not want to be extremely aggressive with her care even though she is a full code 3. Leukocytosis-we'll repeat CBC in 10/5-this trended down and was 7.6 on discharge 4. Hypertension-moderately well-controlled blood pressure is controlled on amlodipine 5 mg daily 5. PVCs sinus arrhythmia-stable 6. Dementia with adult failure to thrive-not oriented. Not she is in the hospital on day of discharge but thinks that she is in [facility]. Somewhat confused but this may be situational. 7. Disposition-discharge to nursing facility with potentially need for palliative input potentially if she does fail to thrive. Will need to have CODE STATUS discussion as well as realistic expectations for goals of care as an outpatient    Consultants:  GI-Pyrtle  Gen surg-Toth  Cardiology-Ganji  Procedures:  See procedure HIDA scan showed hepatocellular disease Ultrasound was equivocal for gallstones  Antibiotics:  Azithromycin-10/1  Ceftriaxone-10/1 Ceftin 10/6-->10/8   Discharge Exam: Filed Vitals:   02/01/13 0401  BP: 129/50  Pulse: 79  Temp: 98 F (36.7 C)  Resp: 18   Pleasant oriented no apparent distress Slightly confused  General: EOMI, NCAT  Cardiovascular: S1-S2 no murmur rub or gallop   Respiratory: Clinically clear  Discharge Instructions  Discharge Orders   Future Orders Complete By Expires   Call MD for:  difficulty breathing, headache or visual disturbances  As directed    Call MD for:  persistant dizziness or light-headedness  As directed    Call MD for:  severe uncontrolled pain  As directed    Call MD for:  temperature >100.4  As directed    Diet - low sodium heart healthy  As directed    Discharge instructions  As directed    Comments:     Complete antibiotics in 2 days Needs OP goals of care Consider d/c multiple meds such as calcium/ vitamins if no other indication   Increase activity slowly  As directed        Medication List    STOP taking these medications       cholecalciferol 1000 UNITS tablet  Commonly known as:  VITAMIN D     potassium chloride SA 20 MEQ tablet  Commonly known as:  K-DUR,KLOR-CON      TAKE these medications       amLODipine 5 MG tablet  Commonly known as:  NORVASC  Take 5 mg by mouth daily.     CALCIUM 500 PO  Take 2 tablets by mouth daily.     cefUROXime 500 MG tablet  Commonly known as:  CEFTIN  Take 1 tablet (500 mg total) by mouth 2 (two) times daily with a meal.     docusate sodium 100 MG capsule  Commonly known as:  COLACE  Take 100 mg by mouth 2 (two) times daily.     ferrous sulfate 325 (65 FE) MG tablet  Take 325 mg by mouth daily with breakfast.     furosemide 20 MG tablet  Commonly known as:  LASIX  Take 20 mg by mouth daily.     omeprazole 20 MG capsule  Commonly known as:  PRILOSEC  Take 2 capsules (40 mg total) by mouth daily as needed. For acid reflux     primidone 50 MG tablet  Commonly known as:  MYSOLINE  Take 50 mg by mouth daily as needed. For pain     vitamin C 500 MG tablet  Commonly known as:  ASCORBIC ACID  Take 500 mg by mouth daily.       No Known Allergies    The results of significant diagnostics from this hospitalization (including imaging, microbiology,  ancillary and laboratory) are listed below for reference.    Significant Diagnostic Studies: Nm Hepatobiliary  01/28/2013   CLINICAL DATA:  Elevated liver function tests. Abdominal pain. Fever. Vomiting.  EXAM: NUCLEAR MEDICINE HEPATOBILIARY IMAGING  TECHNIQUE: Sequential images of the abdomen were obtained out to 60 minutes following intravenous administration of radiopharmaceutical.  COMPARISON:  None.  RADIOPHARMACEUTICALS:  4.52mCi Tc-92m Choletec  FINDINGS: Mildly delayed radiopharmaceutical uptake by the liver is seen, indicating some degree of hepatocellular dysfunction. Liver is otherwise normal in appearance.  There is no biliary excretion of activity, and therefore gallbladder filling could not be assessed. This may be due to hepatocellular dysfunction although biliary obstruction cannot be excluded.  IMPRESSION: Mildly delayed radiopharmaceutical uptake by the liver, consistent with hepatocellular dysfunction.  Absence of biliary excretion of activity, also likely due to hepatocellular dysfunction although biliary obstruction cannot be excluded.   Electronically Signed   By: Myles Rosenthal   On: 01/28/2013 15:43   US Abdomen  Complete  01/28/2013   CLINICAL DATA:  History of elevated liver function tests. History of nausea and vomiting. History of hypertension and congestive heart failure.  EXAM: ULTRASOUND ABDOMEN COMPLETE  COMPARISON:  None.  IMPRESSION: Examination was compromised by patient's inability to cooperate. There is echogenic material within the gallbladder with acoustic shadowing posterior to it felt represent cholelithiasis with details are limited. No gallbladder wall thickening or pericholecystic fluid was evident. Whether there was a positive sonographic Eulah Pont sign could not be established. Common bile duct is normal in caliber with no choledocholithiasis. Question slight dilatation of branching intrahepatic bile ducts. Atherosclerotic plaquing of the abdominal aorta without evidence  of aneurysm.  FINDINGS:  THERE IS A NOTE FROM THE TECHNOLOGIST THAT THERE WAS PATIENT CONFUSION.  SOME OF THE EXAMINATION WAS COMPROMISED BY INABILITY OF PATIENT TO COOPERATE.: FINDINGS: THERE IS A NOTE FROM THE TECHNOLOGIST THAT THERE WAS PATIENT CONFUSION. SOME OF THE EXAMINATION WAS COMPROMISED BY INABILITY OF PATIENT TO COOPERATE. Gallbladder  Detail compromised by inability of patient to cooperate. There is echogenic material within the gallbladder with acoustic shadowing posterior to it consistent with cholelithiasis. Individual calculi cannot be measured. Gallbladder wall is normal thickness measuring 1.9 mm. There is a note from the technologist that the patient would not let pressure be put on the gallbladder region so the [facility] sign could not be evaluated. No pericholecystic fluid was identified.  Common bile duct  Diameter: Common bile duct measures 3.5 mm. Question slight intrahepatic biliary dilatation. No choledocholithiasis is seen but the examination was somewhat limited.  Liver  No focal lesion identified. Within normal limits in parenchymal echogenicity.  IVC  No abnormality visualized.  Pancreas  Visualized portion unremarkable.  Spleen  Size and appearance within normal limits.  Length is 6 cm.  Right Kidney  Length: Right renal length measured 10.1 cm. Echogenicity within normal limits. No mass or hydronephrosis visualized. Examination was somewhat limited.  Left Kidney  Length: Left renal length is 10.7 cm. Echogenicity within normal limits. No mass or hydronephrosis visualized.  Abdominal aorta  No aneurysm visualized. Maximum diameter was 1.8 cm. Most proximal area could not be evaluated. There is some atherosclerotic plaquing.   Electronically Signed   By: Onalee Hua  Call M.D.   On: 01/28/2013 09:02   Ct Abdomen Pelvis W Contrast  01/29/2013   CLINICAL DATA:  Elevated liver function tests. Fever. Diverticulitis. GI bleeding.  EXAM: CT ABDOMEN AND PELVIS WITH CONTRAST  TECHNIQUE:  Multidetector CT imaging of the abdomen and pelvis was performed using the standard protocol following bolus administration of intravenous contrast.  CONTRAST:  80mL OMNIPAQUE IOHEXOL 300 MG/ML  SOLN  COMPARISON:  None.  FINDINGS: Images through the lung bases show small bilateral pleural effusions and mild bibasilar atelectasis.  A moderate size hiatal hernia is demonstrated. Cholelithiasis is demonstrated, however there is no evidence of gallbladder wall thickening or pericholecystic inflammatory changes. No evidence of biliary ductal dilatation.  The liver, pancreas, spleen, adrenal glands, and kidneys are normal in appearance. No soft tissue masses or lymphadenopathy identified within the abdomen or pelvis.  Prior hysterectomy noted. Adnexal regions are unremarkable. Right hip prosthesis results in beam hardening artifact through the inferior pelvis. Diffuse colonic diverticulosis is noted, however there is no evidence of diverticulitis. No other inflammatory process or abnormal fluid collections are identified.  IMPRESSION: Moderate hiatal hernia.  Cholelithiasis. No radiographic evidence of cholecystitis.  Diverticulosis. No radiographic evidence of diverticulitis.  Small bilateral pleural effusions and bibasilar atelectasis.   Electronically  Signed   By: Myles Rosenthal M.D.   On: 01/29/2013 16:10   Dg Chest Portable 1 View  01/28/2013   CLINICAL DATA:  Fever.  EXAM: PORTABLE CHEST - 1 VIEW  COMPARISON:  07/17/2011  FINDINGS: Heart is mildly enlarged. No confluent airspace opacities. Mild biapical pleural thickening/ scarring. No effusions. No acute bony abnormality.  IMPRESSION: No active disease.   Electronically Signed   By: Charlett Nose M.D.   On: 01/28/2013 01:50    Microbiology: Recent Results (from the past 240 hour(s))  MRSA PCR SCREENING     Status: None   Collection Time    01/29/13 12:03 PM      Result Value Range Status   MRSA by PCR NEGATIVE  NEGATIVE Final   Comment:            The  GeneXpert MRSA Assay (FDA     approved for NASAL specimens     only), is one component of a     comprehensive MRSA colonization     surveillance program. It is not     intended to diagnose MRSA     infection nor to guide or     monitor treatment for     MRSA infections.     Performed at [facility]     Labs: Basic Metabolic Panel:  Recent Labs Lab 01/28/13 0155 01/28/13 0208 01/28/13 0550 01/29/13 0343 01/30/13 0845  NA 139 144 138 137 135  K 3.4* 3.1* 3.8 3.9 3.9  CL 102 104 102 105 103  CO2 26  --  26 24 23   GLUCOSE 155* 149* 135* 101* 89  BUN 21 20 22 20 12   CREATININE 0.91 1.20* 0.92 0.85 0.65  CALCIUM 9.1  --  8.6 8.6 9.0   Liver Function Tests:  Recent Labs Lab 01/28/13 0155 01/28/13 0550 01/29/13 0343 01/30/13 0845  AST 208* 159* 76* 36  ALT 131* 113* 78* 52*  ALKPHOS 66 59 66 91  BILITOT 2.4* 2.6* 3.4* 2.3*  PROT 7.9 6.9 7.1 7.5  ALBUMIN 2.7* 2.3* 2.3* 2.2*    Recent Labs Lab 01/28/13 0155  LIPASE 6*   No results found for this basename: AMMONIA,  in the last 168 hours CBC:  Recent Labs Lab 01/28/13 0155 01/28/13 0208 01/28/13 0550 01/29/13 0343 02/01/13 0435  WBC 29.4*  --  25.8* 16.2* 7.9  NEUTROABS  --   --   --  12.8* 4.3  HGB 10.6* 10.9* 9.7* 9.9* 10.2*  HCT 31.1* 32.0* 29.0* 29.7* 30.0*  MCV 90.1  --  90.3 91.1 88.8  PLT 227  --  208 210 256   Cardiac Enzymes: No results found for this basename: CKTOTAL, CKMB, CKMBINDEX, TROPONINI,  in the last 168 hours BNP: BNP (last 3 results) No results found for this basename: PROBNP,  in the last 8760 hours CBG: No results found for this basename: GLUCAP,  in the last 168 hours     Signed:  Rhetta Mura  [facility] 02/01/2013, 8:11 AM

## 2013-02-01 NOTE — Progress Notes (Signed)
Patient is set to discharge to [facility] today. Patient & son aware. Discharge packet in [place]. [facility] scheduled for 2:00p pickup (Service Request Id: [id number]) per SNF request. RN, Natashia aware.   Clinical Social Work Department CLINICAL SOCIAL WORK PLACEMENT NOTE 02/01/2013  Patient:  Sarah Perkins, Sarah Perkins  Account Number:  [id number] Admit date:  01/28/2013  Clinical Social Worker:  Orpah Greek  Date/time:  01/28/2013 04:16 PM  Clinical Social Work is seeking post-discharge placement for this patient at the following level of care:   SKILLED NURSING   (*CSW will update this form in Epic as items are completed)   01/28/2013  Patient/family provided with [facility] Department of Clinical Social Work's list of facilities offering this level of care within the geographic area requested by the patient (or if unable, by the patient's family).  01/28/2013  Patient/family informed of their freedom to choose among providers that offer the needed level of care, that participate in Medicare, Medicaid or managed care program needed by the patient, have an available bed and are willing to accept the patient.  01/28/2013  Patient/family informed of [facility]' ownership interest in [facility], as well as of the fact that they are under no obligation to receive care at this facility.  PASARR submitted to EDS on 01/28/2013 PASARR number received from EDS on 01/28/2013  FL2 transmitted to all facilities in geographic area requested by pt/family on  01/28/2013 FL2 transmitted to all facilities within larger geographic area on   Patient informed that his/her managed care company has contracts with or will negotiate with  certain facilities, including the following:     Patient/family informed of bed offers received:  01/30/2013 Patient chooses bed at [facility] Physician recommends and patient chooses bed at    Patient to be transferred to  [facility] on  02/01/2013 Patient to be transferred to facility by [facility]  The following physician request were entered in Epic:   Additional Comments:   Unice Bailey, LCSW [facility] Clinical Social Worker cell #: [phone]

## 2013-03-03 DIAGNOSIS — L8991 Pressure ulcer of unspecified site, stage 1: Secondary | ICD-10-CM | POA: Diagnosis not present

## 2013-03-03 DIAGNOSIS — N390 Urinary tract infection, site not specified: Secondary | ICD-10-CM | POA: Diagnosis not present

## 2013-03-03 DIAGNOSIS — Z5189 Encounter for other specified aftercare: Secondary | ICD-10-CM | POA: Diagnosis not present

## 2013-03-03 DIAGNOSIS — M6281 Muscle weakness (generalized): Secondary | ICD-10-CM | POA: Diagnosis not present

## 2013-03-03 DIAGNOSIS — L89609 Pressure ulcer of unspecified heel, unspecified stage: Secondary | ICD-10-CM | POA: Diagnosis not present

## 2013-03-03 DIAGNOSIS — R269 Unspecified abnormalities of gait and mobility: Secondary | ICD-10-CM | POA: Diagnosis not present

## 2013-03-05 DIAGNOSIS — R269 Unspecified abnormalities of gait and mobility: Secondary | ICD-10-CM | POA: Diagnosis not present

## 2013-03-05 DIAGNOSIS — N390 Urinary tract infection, site not specified: Secondary | ICD-10-CM | POA: Diagnosis not present

## 2013-03-05 DIAGNOSIS — L8991 Pressure ulcer of unspecified site, stage 1: Secondary | ICD-10-CM | POA: Diagnosis not present

## 2013-03-05 DIAGNOSIS — L89609 Pressure ulcer of unspecified heel, unspecified stage: Secondary | ICD-10-CM | POA: Diagnosis not present

## 2013-03-05 DIAGNOSIS — M6281 Muscle weakness (generalized): Secondary | ICD-10-CM | POA: Diagnosis not present

## 2013-03-05 DIAGNOSIS — Z5189 Encounter for other specified aftercare: Secondary | ICD-10-CM | POA: Diagnosis not present

## 2013-03-08 DIAGNOSIS — Z5189 Encounter for other specified aftercare: Secondary | ICD-10-CM | POA: Diagnosis not present

## 2013-03-08 DIAGNOSIS — L8991 Pressure ulcer of unspecified site, stage 1: Secondary | ICD-10-CM | POA: Diagnosis not present

## 2013-03-08 DIAGNOSIS — R269 Unspecified abnormalities of gait and mobility: Secondary | ICD-10-CM | POA: Diagnosis not present

## 2013-03-08 DIAGNOSIS — N390 Urinary tract infection, site not specified: Secondary | ICD-10-CM | POA: Diagnosis not present

## 2013-03-08 DIAGNOSIS — M6281 Muscle weakness (generalized): Secondary | ICD-10-CM | POA: Diagnosis not present

## 2013-03-08 DIAGNOSIS — L89609 Pressure ulcer of unspecified heel, unspecified stage: Secondary | ICD-10-CM | POA: Diagnosis not present

## 2013-03-10 DIAGNOSIS — M6281 Muscle weakness (generalized): Secondary | ICD-10-CM | POA: Diagnosis not present

## 2013-03-10 DIAGNOSIS — N390 Urinary tract infection, site not specified: Secondary | ICD-10-CM | POA: Diagnosis not present

## 2013-03-10 DIAGNOSIS — R269 Unspecified abnormalities of gait and mobility: Secondary | ICD-10-CM | POA: Diagnosis not present

## 2013-03-10 DIAGNOSIS — L8991 Pressure ulcer of unspecified site, stage 1: Secondary | ICD-10-CM | POA: Diagnosis not present

## 2013-03-10 DIAGNOSIS — Z5189 Encounter for other specified aftercare: Secondary | ICD-10-CM | POA: Diagnosis not present

## 2013-03-10 DIAGNOSIS — L89609 Pressure ulcer of unspecified heel, unspecified stage: Secondary | ICD-10-CM | POA: Diagnosis not present

## 2013-03-12 DIAGNOSIS — M6281 Muscle weakness (generalized): Secondary | ICD-10-CM | POA: Diagnosis not present

## 2013-03-12 DIAGNOSIS — Z5189 Encounter for other specified aftercare: Secondary | ICD-10-CM | POA: Diagnosis not present

## 2013-03-12 DIAGNOSIS — L8991 Pressure ulcer of unspecified site, stage 1: Secondary | ICD-10-CM | POA: Diagnosis not present

## 2013-03-12 DIAGNOSIS — N390 Urinary tract infection, site not specified: Secondary | ICD-10-CM | POA: Diagnosis not present

## 2013-03-12 DIAGNOSIS — L89609 Pressure ulcer of unspecified heel, unspecified stage: Secondary | ICD-10-CM | POA: Diagnosis not present

## 2013-03-12 DIAGNOSIS — R269 Unspecified abnormalities of gait and mobility: Secondary | ICD-10-CM | POA: Diagnosis not present

## 2013-03-16 DIAGNOSIS — Z5189 Encounter for other specified aftercare: Secondary | ICD-10-CM | POA: Diagnosis not present

## 2013-03-16 DIAGNOSIS — M6281 Muscle weakness (generalized): Secondary | ICD-10-CM | POA: Diagnosis not present

## 2013-03-16 DIAGNOSIS — N390 Urinary tract infection, site not specified: Secondary | ICD-10-CM | POA: Diagnosis not present

## 2013-03-16 DIAGNOSIS — R269 Unspecified abnormalities of gait and mobility: Secondary | ICD-10-CM | POA: Diagnosis not present

## 2013-03-16 DIAGNOSIS — L89609 Pressure ulcer of unspecified heel, unspecified stage: Secondary | ICD-10-CM | POA: Diagnosis not present

## 2013-03-16 DIAGNOSIS — L8991 Pressure ulcer of unspecified site, stage 1: Secondary | ICD-10-CM | POA: Diagnosis not present

## 2013-03-17 DIAGNOSIS — R269 Unspecified abnormalities of gait and mobility: Secondary | ICD-10-CM | POA: Diagnosis not present

## 2013-03-17 DIAGNOSIS — M6281 Muscle weakness (generalized): Secondary | ICD-10-CM | POA: Diagnosis not present

## 2013-03-17 DIAGNOSIS — Z5189 Encounter for other specified aftercare: Secondary | ICD-10-CM | POA: Diagnosis not present

## 2013-03-17 DIAGNOSIS — N390 Urinary tract infection, site not specified: Secondary | ICD-10-CM | POA: Diagnosis not present

## 2013-03-17 DIAGNOSIS — L8991 Pressure ulcer of unspecified site, stage 1: Secondary | ICD-10-CM | POA: Diagnosis not present

## 2013-03-17 DIAGNOSIS — L89609 Pressure ulcer of unspecified heel, unspecified stage: Secondary | ICD-10-CM | POA: Diagnosis not present

## 2013-03-19 DIAGNOSIS — Z5189 Encounter for other specified aftercare: Secondary | ICD-10-CM | POA: Diagnosis not present

## 2013-03-19 DIAGNOSIS — R269 Unspecified abnormalities of gait and mobility: Secondary | ICD-10-CM | POA: Diagnosis not present

## 2013-03-19 DIAGNOSIS — N390 Urinary tract infection, site not specified: Secondary | ICD-10-CM | POA: Diagnosis not present

## 2013-03-19 DIAGNOSIS — L8991 Pressure ulcer of unspecified site, stage 1: Secondary | ICD-10-CM | POA: Diagnosis not present

## 2013-03-19 DIAGNOSIS — L89609 Pressure ulcer of unspecified heel, unspecified stage: Secondary | ICD-10-CM | POA: Diagnosis not present

## 2013-03-19 DIAGNOSIS — M6281 Muscle weakness (generalized): Secondary | ICD-10-CM | POA: Diagnosis not present

## 2013-03-22 DIAGNOSIS — R269 Unspecified abnormalities of gait and mobility: Secondary | ICD-10-CM | POA: Diagnosis not present

## 2013-03-22 DIAGNOSIS — Z5189 Encounter for other specified aftercare: Secondary | ICD-10-CM | POA: Diagnosis not present

## 2013-03-22 DIAGNOSIS — L8991 Pressure ulcer of unspecified site, stage 1: Secondary | ICD-10-CM | POA: Diagnosis not present

## 2013-03-22 DIAGNOSIS — L89609 Pressure ulcer of unspecified heel, unspecified stage: Secondary | ICD-10-CM | POA: Diagnosis not present

## 2013-03-22 DIAGNOSIS — N390 Urinary tract infection, site not specified: Secondary | ICD-10-CM | POA: Diagnosis not present

## 2013-03-22 DIAGNOSIS — M6281 Muscle weakness (generalized): Secondary | ICD-10-CM | POA: Diagnosis not present

## 2013-03-23 DIAGNOSIS — R269 Unspecified abnormalities of gait and mobility: Secondary | ICD-10-CM | POA: Diagnosis not present

## 2013-03-23 DIAGNOSIS — N390 Urinary tract infection, site not specified: Secondary | ICD-10-CM | POA: Diagnosis not present

## 2013-03-23 DIAGNOSIS — L89609 Pressure ulcer of unspecified heel, unspecified stage: Secondary | ICD-10-CM | POA: Diagnosis not present

## 2013-03-23 DIAGNOSIS — Z5189 Encounter for other specified aftercare: Secondary | ICD-10-CM | POA: Diagnosis not present

## 2013-03-23 DIAGNOSIS — M6281 Muscle weakness (generalized): Secondary | ICD-10-CM | POA: Diagnosis not present

## 2013-03-23 DIAGNOSIS — L8991 Pressure ulcer of unspecified site, stage 1: Secondary | ICD-10-CM | POA: Diagnosis not present

## 2013-03-24 DIAGNOSIS — N390 Urinary tract infection, site not specified: Secondary | ICD-10-CM | POA: Diagnosis not present

## 2013-03-24 DIAGNOSIS — L89609 Pressure ulcer of unspecified heel, unspecified stage: Secondary | ICD-10-CM | POA: Diagnosis not present

## 2013-03-24 DIAGNOSIS — Z5189 Encounter for other specified aftercare: Secondary | ICD-10-CM | POA: Diagnosis not present

## 2013-03-24 DIAGNOSIS — M6281 Muscle weakness (generalized): Secondary | ICD-10-CM | POA: Diagnosis not present

## 2013-03-24 DIAGNOSIS — R269 Unspecified abnormalities of gait and mobility: Secondary | ICD-10-CM | POA: Diagnosis not present

## 2013-03-24 DIAGNOSIS — L8991 Pressure ulcer of unspecified site, stage 1: Secondary | ICD-10-CM | POA: Diagnosis not present

## 2013-03-27 ENCOUNTER — Emergency Department (HOSPITAL_COMMUNITY)
Admission: EM | Admit: 2013-03-27 | Discharge: 2013-03-27 | Disposition: A | Discharge status: Home / Self Care | Payer: Medicare Other | Attending: Emergency Medicine | Admitting: Emergency Medicine

## 2013-03-27 ENCOUNTER — Encounter (HOSPITAL_COMMUNITY): Payer: Self-pay | Admitting: Emergency Medicine

## 2013-03-27 ENCOUNTER — Emergency Department (HOSPITAL_COMMUNITY): Payer: Medicare Other

## 2013-03-27 DIAGNOSIS — K219 Gastro-esophageal reflux disease without esophagitis: Secondary | ICD-10-CM | POA: Diagnosis not present

## 2013-03-27 DIAGNOSIS — Z471 Aftercare following joint replacement surgery: Secondary | ICD-10-CM | POA: Diagnosis not present

## 2013-03-27 DIAGNOSIS — I10 Essential (primary) hypertension: Secondary | ICD-10-CM | POA: Diagnosis not present

## 2013-03-27 DIAGNOSIS — R296 Repeated falls: Secondary | ICD-10-CM | POA: Insufficient documentation

## 2013-03-27 DIAGNOSIS — S8000XA Contusion of unspecified knee, initial encounter: Secondary | ICD-10-CM | POA: Insufficient documentation

## 2013-03-27 DIAGNOSIS — T148XXA Other injury of unspecified body region, initial encounter: Secondary | ICD-10-CM

## 2013-03-27 DIAGNOSIS — D62 Acute posthemorrhagic anemia: Secondary | ICD-10-CM | POA: Diagnosis not present

## 2013-03-27 DIAGNOSIS — Y9289 Other specified places as the place of occurrence of the external cause: Secondary | ICD-10-CM | POA: Insufficient documentation

## 2013-03-27 DIAGNOSIS — S8990XA Unspecified injury of unspecified lower leg, initial encounter: Secondary | ICD-10-CM | POA: Diagnosis not present

## 2013-03-27 DIAGNOSIS — I509 Heart failure, unspecified: Secondary | ICD-10-CM | POA: Insufficient documentation

## 2013-03-27 DIAGNOSIS — R55 Syncope and collapse: Secondary | ICD-10-CM | POA: Insufficient documentation

## 2013-03-27 DIAGNOSIS — Z87440 Personal history of urinary (tract) infections: Secondary | ICD-10-CM | POA: Insufficient documentation

## 2013-03-27 DIAGNOSIS — M25569 Pain in unspecified knee: Secondary | ICD-10-CM | POA: Diagnosis not present

## 2013-03-27 DIAGNOSIS — Z79899 Other long term (current) drug therapy: Secondary | ICD-10-CM | POA: Diagnosis not present

## 2013-03-27 DIAGNOSIS — Z860100 Personal history of colon polyps, unspecified: Secondary | ICD-10-CM | POA: Insufficient documentation

## 2013-03-27 DIAGNOSIS — Z96649 Presence of unspecified artificial hip joint: Secondary | ICD-10-CM | POA: Diagnosis not present

## 2013-03-27 DIAGNOSIS — Y9389 Activity, other specified: Secondary | ICD-10-CM | POA: Insufficient documentation

## 2013-03-27 MED ORDER — HYDROCODONE-ACETAMINOPHEN 5-325 MG PO TABS: 0.5000 | ORAL_TABLET | ORAL | Status: DC | PRN

## 2013-03-27 NOTE — ED Notes (Signed)
Bed: RESB Expected date: 03/27/13 Expected time: 3:05 PM Means of arrival: Ambulance Comments: Pain

## 2013-03-27 NOTE — ED Notes (Signed)
Pt son given d/c instructions. Son verbalized understanding of meds and follow up with PCP.

## 2013-03-27 NOTE — ED Notes (Signed)
Pt from home, per pt son, pt fell while transferring from [facility] yesterday, injuring her R knee. Pt has no bruising or injury noted. Pt did not hit her head. Pt is A&O and in NAD

## 2013-03-27 NOTE — ED Notes (Signed)
Pt from home via [facility], fall yesterday injuring her R knee. Pt son witnessed fall and reports that pt did not hit head, no LOC. Pt is A&O and in NAD

## 2013-03-27 NOTE — ED Notes (Signed)
Called [facility] to transport pt back to her home per Dr. Oletta Cohn

## 2013-03-27 NOTE — ED Provider Notes (Signed)
CSN: [id number]     Arrival date & time 03/27/13  1507 History   First MD Initiated Contact with Patient 03/27/13 1513     Chief Complaint  Patient presents with  . Fall  . Knee Pain   (Consider location/radiation/quality/duration/timing/severity/associated sxs/prior Treatment) HPI Comments: Patient presents to the ER for evaluation of right leg injury. Patient reportedly fell yesterday while transferring. Patient's son was assisting her, reports that he partially caught her, but she did fall to the ground. She landed with her lower extremities under her, has been complaining of moderate pain in the right knee area ever since the fall. Pain worsens with movement. She did not hit her head, no loss of consciousness.  Patient is a 79 y.o. female presenting with fall and knee pain.  Fall Pertinent negatives include no headaches.  Knee Pain   Past Medical History  Diagnosis Date  . CHF (congestive heart failure)   . Hypertension   . Aneurysm 1972    cerebral  . Acid reflux     occasional  . Tremor     worse on right arm  . History of GI diverticular bleed 06/2009    colonoscopy with endo clipping of bleeding tic by Dr Vida Rigger.  Flex sig by Dr Dulce Sellar.  . Acute blood loss anemia 06/2009    received ~ 6 units blood  . Adenomatous polyp of colon 06/2009  . Diverticulitis   . UTI (lower urinary tract infection)    Past Surgical History  Procedure Laterality Date  . Hip fracture surgery  2011  . Colonoscopy w/ control of hemorrhage  06/2009  . Knee arthroscopy    . Cerebral aneurysm repair  1970's  . Colonoscopy  09/22/2011    Procedure: COLONOSCOPY;  Surgeon: Charna Elizabeth, MD;  Location: [facility] ENDOSCOPY;  Service: Endoscopy;  Laterality: N/A;  wants ped scope   No family history on file. History  Substance Use Topics  . Smoking status: Never Smoker   . Smokeless tobacco: Never Used  . Alcohol Use: No   OB History   Grav Para Term Preterm Abortions TAB SAB Ect Mult Living           Review of Systems  Musculoskeletal: Positive for arthralgias.  Neurological: Positive for syncope. Negative for dizziness and headaches.  All other systems reviewed and are negative.    Allergies  Review of patient's allergies indicates no known allergies.  Home Medications   Current Outpatient Rx  Name  Route  Sig  Dispense  Refill  . amLODipine (NORVASC) 5 MG tablet   Oral   Take 5 mg by mouth daily.         . Calcium Carbonate (CALCIUM 500 PO)   Oral   Take 2 tablets by mouth daily.          . cefUROXime (CEFTIN) 500 MG tablet   Oral   Take 1 tablet (500 mg total) by mouth 2 (two) times daily with a meal.   4 tablet   0   . docusate sodium (COLACE) 100 MG capsule   Oral   Take 100 mg by mouth 2 (two) times daily.         . ferrous sulfate 325 (65 FE) MG tablet   Oral   Take 325 mg by mouth daily with breakfast.         . furosemide (LASIX) 20 MG tablet   Oral   Take 20 mg by mouth daily.         Marland Kitchen  omeprazole (PRILOSEC) 20 MG capsule   Oral   Take 2 capsules (40 mg total) by mouth daily as needed. For acid reflux         . primidone (MYSOLINE) 50 MG tablet   Oral   Take 50 mg by mouth daily as needed. For pain         . vitamin C (ASCORBIC ACID) 500 MG tablet   Oral   Take 500 mg by mouth daily.          BP 157/66  Pulse 90  Temp(Src) 97.6 F (36.4 C) (Oral)  Resp 20  SpO2 96% Physical Exam  Constitutional: She is oriented to person, place, and time. She appears well-developed and well-nourished. No distress.  HENT:  Head: Normocephalic and atraumatic.  Right Ear: Hearing normal.  Left Ear: Hearing normal.  Nose: Nose normal.  Mouth/Throat: Oropharynx is clear and moist and mucous membranes are normal.  Eyes: Conjunctivae and EOM are normal. Pupils are equal, round, and reactive to light.  Neck: Normal range of motion. Neck supple.  Cardiovascular: Regular rhythm, S1 normal and S2 normal.  Exam reveals no gallop and no  friction rub.   No murmur heard. Pulses:      Dorsalis pedis pulses are 1+ on the right side, and 1+ on the left side.  Pulmonary/Chest: Effort normal and breath sounds normal. No respiratory distress. She exhibits no tenderness.  Abdominal: Soft. Normal appearance and bowel sounds are normal. There is no hepatosplenomegaly. There is no tenderness. There is no rebound, no guarding, no tenderness at McBurney's point and negative Murphy's sign. No hernia.  Musculoskeletal: Normal range of motion.       Right hip: She exhibits no deformity.       Right knee: She exhibits no swelling, no effusion, no ecchymosis, no deformity, no laceration and no erythema. Tenderness found.  Neurological: She is alert and oriented to person, place, and time. She has normal strength. No cranial nerve deficit or sensory deficit. Coordination normal. GCS eye subscore is 4. GCS verbal subscore is 5. GCS motor subscore is 6.  Skin: Skin is warm, dry and intact. No rash noted. No cyanosis.  Psychiatric: She has a normal mood and affect. Her speech is normal and behavior is normal. Thought content normal.    ED Course  Procedures (including critical care time) Labs Review Labs Reviewed - No data to display Imaging Review Dg Hip Complete Right  03/27/2013   CLINICAL DATA:  Fall with right hip pain.  EXAM: RIGHT HIP - COMPLETE 2+ VIEW  COMPARISON:  CT abdomen pelvis 01/29/2013  FINDINGS: There post are operative changes of right unipolar hip arthroplasty. No complicating features identified. No acute fracture is seen. The right pubic bone is intact. The hip prosthesis is located. The surrounding soft tissues are unremarkable.  IMPRESSION: Right hip arthroplasty. No complicating feature or acute bony abnormality identified.   Electronically Signed   By: Britta Mccreedy M.D.   On: 03/27/2013 16:11   Dg Knee Complete 4 Views Right  03/27/2013   CLINICAL DATA:  Right knee pain, status post fall  EXAM: RIGHT KNEE - COMPLETE 4+  VIEW  COMPARISON:  None.  FINDINGS: No fracture or dislocation is seen.  Moderate tricompartmental degenerative changes.  No definite suprapatellar knee joint effusion.  IMPRESSION: No fracture or dislocation is seen.  Moderate tricompartmental degenerative changes.   Electronically Signed   By: Charline Bills M.D.   On: 03/27/2013 16:11    EKG  Interpretation   None       MDM  Diagnosis: Right knee pain, status post fall  Patient presents to the ER for complaints of right knee pain. Patient had a ground-level fall during a transfer at home yesterday. Patient was being helped by her son, he reports that he was able to get to the ground without significant fall, but she did land heavily on the knee and leg area. There was no evidence of head injury. Patient does have a right hip prosthesis from previous fracture. X-ray of right hip and pelvis is unremarkable. X-ray of knee show significant arthritis, no fracture. Examination shows no overlying erythema, warmth or signs of infection. There is no effusion or external signs of trauma. Patient is nonambulatory, will continue rest and analgesia, followup with primary doctor.    Gilda Crease, MD 03/27/13 [phone]

## 2013-03-30 DIAGNOSIS — L89609 Pressure ulcer of unspecified heel, unspecified stage: Secondary | ICD-10-CM | POA: Diagnosis not present

## 2013-03-30 DIAGNOSIS — M6281 Muscle weakness (generalized): Secondary | ICD-10-CM | POA: Diagnosis not present

## 2013-03-30 DIAGNOSIS — Z5189 Encounter for other specified aftercare: Secondary | ICD-10-CM | POA: Diagnosis not present

## 2013-03-30 DIAGNOSIS — R269 Unspecified abnormalities of gait and mobility: Secondary | ICD-10-CM | POA: Diagnosis not present

## 2013-03-30 DIAGNOSIS — L8991 Pressure ulcer of unspecified site, stage 1: Secondary | ICD-10-CM | POA: Diagnosis not present

## 2013-03-30 DIAGNOSIS — N390 Urinary tract infection, site not specified: Secondary | ICD-10-CM | POA: Diagnosis not present

## 2013-04-01 DIAGNOSIS — Z5189 Encounter for other specified aftercare: Secondary | ICD-10-CM | POA: Diagnosis not present

## 2013-04-01 DIAGNOSIS — L8991 Pressure ulcer of unspecified site, stage 1: Secondary | ICD-10-CM | POA: Diagnosis not present

## 2013-04-01 DIAGNOSIS — L89609 Pressure ulcer of unspecified heel, unspecified stage: Secondary | ICD-10-CM | POA: Diagnosis not present

## 2013-04-01 DIAGNOSIS — R269 Unspecified abnormalities of gait and mobility: Secondary | ICD-10-CM | POA: Diagnosis not present

## 2013-04-01 DIAGNOSIS — M6281 Muscle weakness (generalized): Secondary | ICD-10-CM | POA: Diagnosis not present

## 2013-04-01 DIAGNOSIS — N390 Urinary tract infection, site not specified: Secondary | ICD-10-CM | POA: Diagnosis not present

## 2013-04-06 DIAGNOSIS — M6281 Muscle weakness (generalized): Secondary | ICD-10-CM | POA: Diagnosis not present

## 2013-04-06 DIAGNOSIS — R269 Unspecified abnormalities of gait and mobility: Secondary | ICD-10-CM | POA: Diagnosis not present

## 2013-04-06 DIAGNOSIS — L89609 Pressure ulcer of unspecified heel, unspecified stage: Secondary | ICD-10-CM | POA: Diagnosis not present

## 2013-04-06 DIAGNOSIS — N390 Urinary tract infection, site not specified: Secondary | ICD-10-CM | POA: Diagnosis not present

## 2013-04-06 DIAGNOSIS — L8991 Pressure ulcer of unspecified site, stage 1: Secondary | ICD-10-CM | POA: Diagnosis not present

## 2013-04-06 DIAGNOSIS — Z5189 Encounter for other specified aftercare: Secondary | ICD-10-CM | POA: Diagnosis not present

## 2013-04-07 DIAGNOSIS — M810 Age-related osteoporosis without current pathological fracture: Secondary | ICD-10-CM | POA: Diagnosis not present

## 2013-04-07 DIAGNOSIS — Z1331 Encounter for screening for depression: Secondary | ICD-10-CM | POA: Diagnosis not present

## 2013-04-07 DIAGNOSIS — Z0000 Encounter for general adult medical examination without abnormal findings: Secondary | ICD-10-CM | POA: Diagnosis not present

## 2013-04-07 DIAGNOSIS — E663 Overweight: Secondary | ICD-10-CM | POA: Diagnosis not present

## 2013-04-07 DIAGNOSIS — D509 Iron deficiency anemia, unspecified: Secondary | ICD-10-CM | POA: Diagnosis not present

## 2013-04-07 DIAGNOSIS — I10 Essential (primary) hypertension: Secondary | ICD-10-CM | POA: Diagnosis not present

## 2013-04-08 DIAGNOSIS — L8991 Pressure ulcer of unspecified site, stage 1: Secondary | ICD-10-CM | POA: Diagnosis not present

## 2013-04-08 DIAGNOSIS — N390 Urinary tract infection, site not specified: Secondary | ICD-10-CM | POA: Diagnosis not present

## 2013-04-08 DIAGNOSIS — L89609 Pressure ulcer of unspecified heel, unspecified stage: Secondary | ICD-10-CM | POA: Diagnosis not present

## 2013-04-08 DIAGNOSIS — Z5189 Encounter for other specified aftercare: Secondary | ICD-10-CM | POA: Diagnosis not present

## 2013-04-08 DIAGNOSIS — R269 Unspecified abnormalities of gait and mobility: Secondary | ICD-10-CM | POA: Diagnosis not present

## 2013-04-08 DIAGNOSIS — M6281 Muscle weakness (generalized): Secondary | ICD-10-CM | POA: Diagnosis not present

## 2013-04-14 DIAGNOSIS — I10 Essential (primary) hypertension: Secondary | ICD-10-CM | POA: Diagnosis not present

## 2013-04-14 DIAGNOSIS — K219 Gastro-esophageal reflux disease without esophagitis: Secondary | ICD-10-CM | POA: Diagnosis not present

## 2013-04-14 DIAGNOSIS — M810 Age-related osteoporosis without current pathological fracture: Secondary | ICD-10-CM | POA: Diagnosis not present

## 2013-04-15 DIAGNOSIS — Z5189 Encounter for other specified aftercare: Secondary | ICD-10-CM | POA: Diagnosis not present

## 2013-04-15 DIAGNOSIS — R269 Unspecified abnormalities of gait and mobility: Secondary | ICD-10-CM | POA: Diagnosis not present

## 2013-04-15 DIAGNOSIS — L89609 Pressure ulcer of unspecified heel, unspecified stage: Secondary | ICD-10-CM | POA: Diagnosis not present

## 2013-04-15 DIAGNOSIS — N390 Urinary tract infection, site not specified: Secondary | ICD-10-CM | POA: Diagnosis not present

## 2013-04-15 DIAGNOSIS — L8991 Pressure ulcer of unspecified site, stage 1: Secondary | ICD-10-CM | POA: Diagnosis not present

## 2013-04-15 DIAGNOSIS — M6281 Muscle weakness (generalized): Secondary | ICD-10-CM | POA: Diagnosis not present

## 2013-04-16 DIAGNOSIS — M6281 Muscle weakness (generalized): Secondary | ICD-10-CM | POA: Diagnosis not present

## 2013-04-16 DIAGNOSIS — N390 Urinary tract infection, site not specified: Secondary | ICD-10-CM | POA: Diagnosis not present

## 2013-04-16 DIAGNOSIS — R269 Unspecified abnormalities of gait and mobility: Secondary | ICD-10-CM | POA: Diagnosis not present

## 2013-04-16 DIAGNOSIS — L89609 Pressure ulcer of unspecified heel, unspecified stage: Secondary | ICD-10-CM | POA: Diagnosis not present

## 2013-04-16 DIAGNOSIS — L8991 Pressure ulcer of unspecified site, stage 1: Secondary | ICD-10-CM | POA: Diagnosis not present

## 2013-04-16 DIAGNOSIS — Z5189 Encounter for other specified aftercare: Secondary | ICD-10-CM | POA: Diagnosis not present

## 2013-04-19 DIAGNOSIS — R269 Unspecified abnormalities of gait and mobility: Secondary | ICD-10-CM | POA: Diagnosis not present

## 2013-04-19 DIAGNOSIS — L8991 Pressure ulcer of unspecified site, stage 1: Secondary | ICD-10-CM | POA: Diagnosis not present

## 2013-04-19 DIAGNOSIS — L89609 Pressure ulcer of unspecified heel, unspecified stage: Secondary | ICD-10-CM | POA: Diagnosis not present

## 2013-04-19 DIAGNOSIS — Z5189 Encounter for other specified aftercare: Secondary | ICD-10-CM | POA: Diagnosis not present

## 2013-04-19 DIAGNOSIS — M6281 Muscle weakness (generalized): Secondary | ICD-10-CM | POA: Diagnosis not present

## 2013-04-19 DIAGNOSIS — N390 Urinary tract infection, site not specified: Secondary | ICD-10-CM | POA: Diagnosis not present

## 2013-04-23 DIAGNOSIS — N390 Urinary tract infection, site not specified: Secondary | ICD-10-CM | POA: Diagnosis not present

## 2013-04-23 DIAGNOSIS — Z5189 Encounter for other specified aftercare: Secondary | ICD-10-CM | POA: Diagnosis not present

## 2013-04-23 DIAGNOSIS — L8991 Pressure ulcer of unspecified site, stage 1: Secondary | ICD-10-CM | POA: Diagnosis not present

## 2013-04-23 DIAGNOSIS — R269 Unspecified abnormalities of gait and mobility: Secondary | ICD-10-CM | POA: Diagnosis not present

## 2013-04-23 DIAGNOSIS — L89609 Pressure ulcer of unspecified heel, unspecified stage: Secondary | ICD-10-CM | POA: Diagnosis not present

## 2013-04-23 DIAGNOSIS — M6281 Muscle weakness (generalized): Secondary | ICD-10-CM | POA: Diagnosis not present

## 2013-04-27 DIAGNOSIS — N390 Urinary tract infection, site not specified: Secondary | ICD-10-CM | POA: Diagnosis not present

## 2013-04-27 DIAGNOSIS — Z5189 Encounter for other specified aftercare: Secondary | ICD-10-CM | POA: Diagnosis not present

## 2013-04-27 DIAGNOSIS — L89609 Pressure ulcer of unspecified heel, unspecified stage: Secondary | ICD-10-CM | POA: Diagnosis not present

## 2013-04-27 DIAGNOSIS — L8991 Pressure ulcer of unspecified site, stage 1: Secondary | ICD-10-CM | POA: Diagnosis not present

## 2013-04-27 DIAGNOSIS — M6281 Muscle weakness (generalized): Secondary | ICD-10-CM | POA: Diagnosis not present

## 2013-04-27 DIAGNOSIS — R269 Unspecified abnormalities of gait and mobility: Secondary | ICD-10-CM | POA: Diagnosis not present

## 2013-04-30 DIAGNOSIS — L89609 Pressure ulcer of unspecified heel, unspecified stage: Secondary | ICD-10-CM | POA: Diagnosis not present

## 2013-04-30 DIAGNOSIS — M6281 Muscle weakness (generalized): Secondary | ICD-10-CM | POA: Diagnosis not present

## 2013-04-30 DIAGNOSIS — N390 Urinary tract infection, site not specified: Secondary | ICD-10-CM | POA: Diagnosis not present

## 2013-04-30 DIAGNOSIS — R269 Unspecified abnormalities of gait and mobility: Secondary | ICD-10-CM | POA: Diagnosis not present

## 2013-04-30 DIAGNOSIS — Z5189 Encounter for other specified aftercare: Secondary | ICD-10-CM | POA: Diagnosis not present

## 2013-04-30 DIAGNOSIS — L8991 Pressure ulcer of unspecified site, stage 1: Secondary | ICD-10-CM | POA: Diagnosis not present

## 2013-10-19 DIAGNOSIS — D509 Iron deficiency anemia, unspecified: Secondary | ICD-10-CM | POA: Diagnosis not present

## 2013-10-19 DIAGNOSIS — I10 Essential (primary) hypertension: Secondary | ICD-10-CM | POA: Diagnosis not present

## 2013-10-19 DIAGNOSIS — M810 Age-related osteoporosis without current pathological fracture: Secondary | ICD-10-CM | POA: Diagnosis not present

## 2013-10-26 DIAGNOSIS — I10 Essential (primary) hypertension: Secondary | ICD-10-CM | POA: Diagnosis not present

## 2013-10-26 DIAGNOSIS — K219 Gastro-esophageal reflux disease without esophagitis: Secondary | ICD-10-CM | POA: Diagnosis not present

## 2013-10-26 DIAGNOSIS — N183 Chronic kidney disease, stage 3 unspecified: Secondary | ICD-10-CM | POA: Diagnosis not present

## 2013-10-26 DIAGNOSIS — M810 Age-related osteoporosis without current pathological fracture: Secondary | ICD-10-CM | POA: Diagnosis not present

## 2013-11-10 IMAGING — CT CT HEAD W/O CM
2 series · 16 of 30 positions shown, 20 images · non-contrast
Comparison: none

[Series 3: head bone · axial · 0.49mm/px · z∈[-4,+36]mm · 3 of 28 slices shown]
[im 2/28  bone]
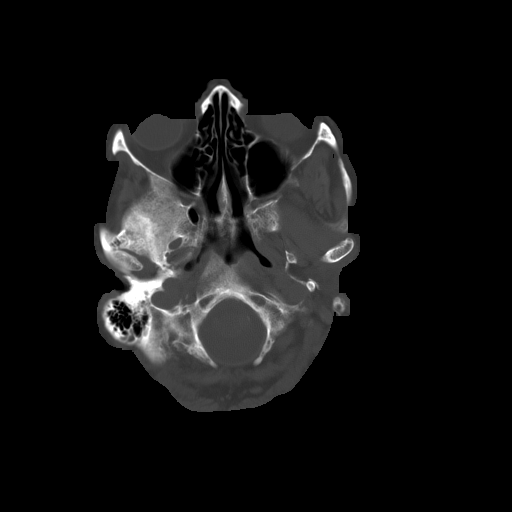
[im 6/28  bone]
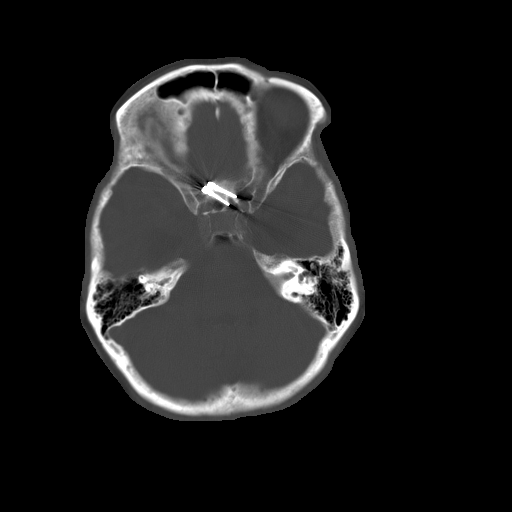
[im 10/28  bone]
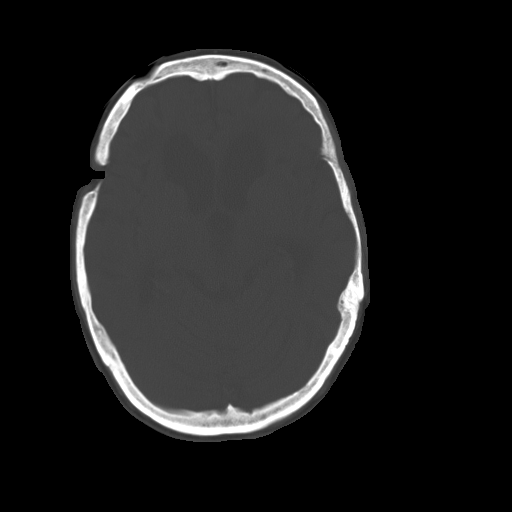

[Series 32: 3d filtered head w/o · axial · non-contrast · 0.49mm/px · z∈[-4,+116]mm · 13 of 28 slices shown, 17 images]
[im 2/28  brain]
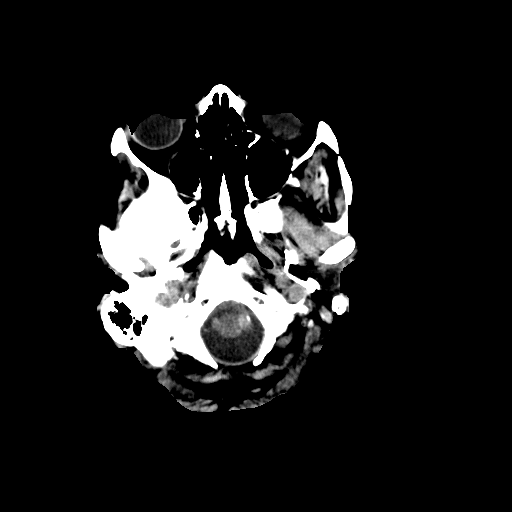
[im 2/28  bone]
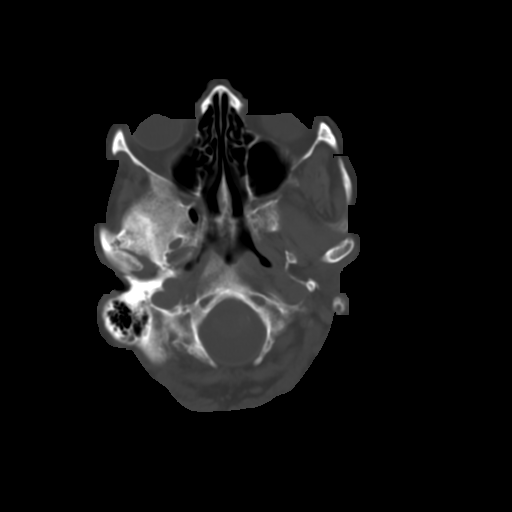
[im 4/28  brain]
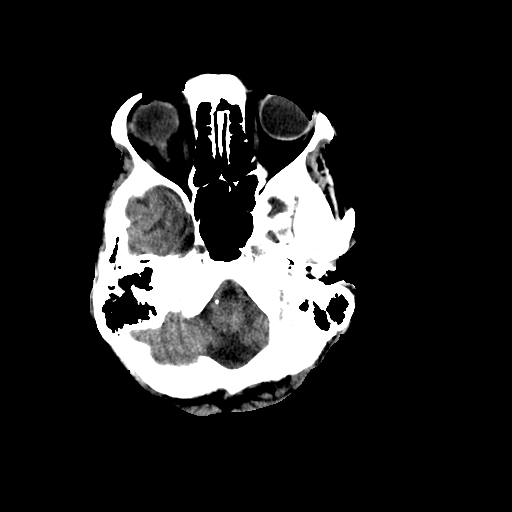
[im 6/28  brain]
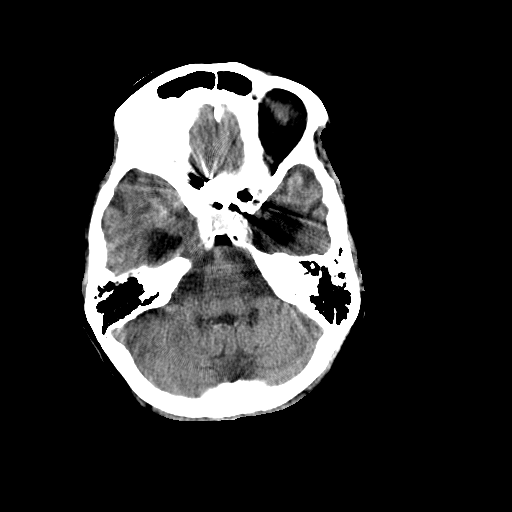
[im 8/28  brain]
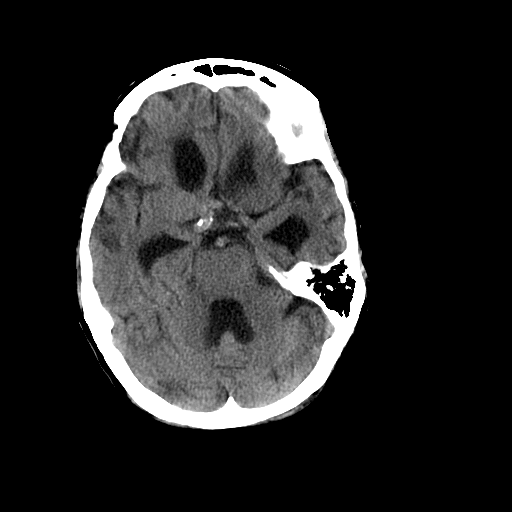
[im 10/28  brain]
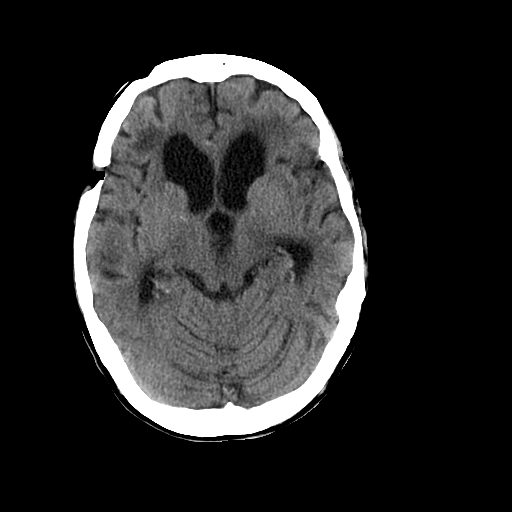
[im 10/28  bone]
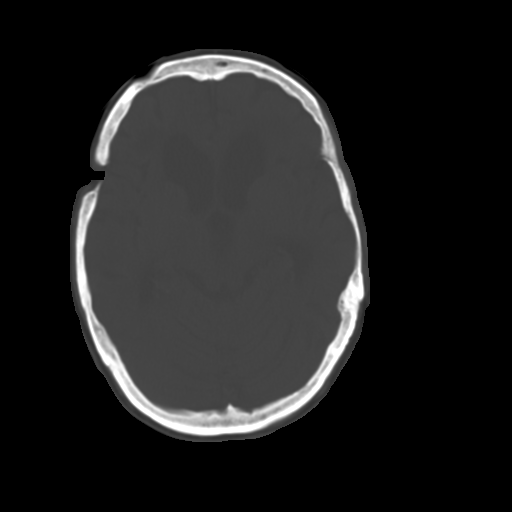
[im 12/28  brain]
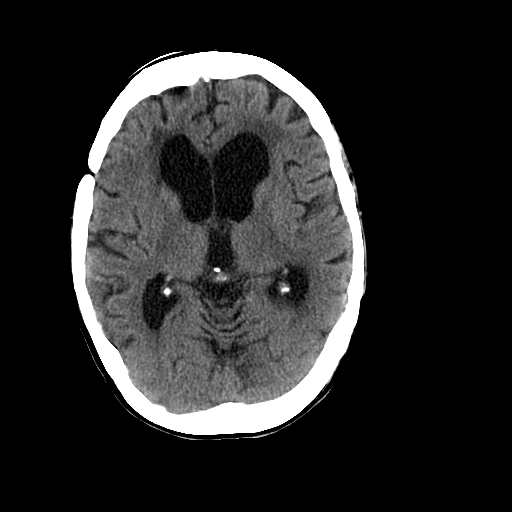
[im 14/28  brain]
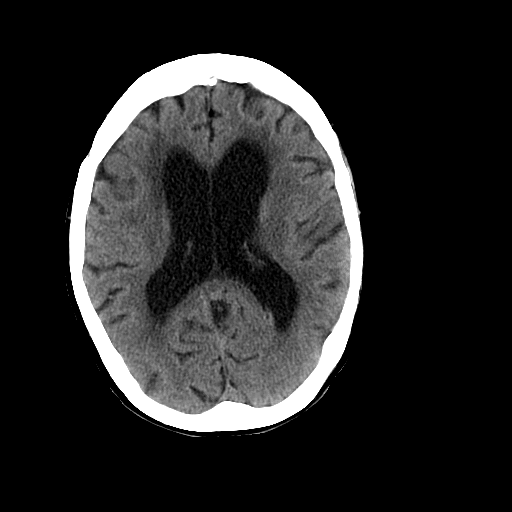
[im 16/28  brain]
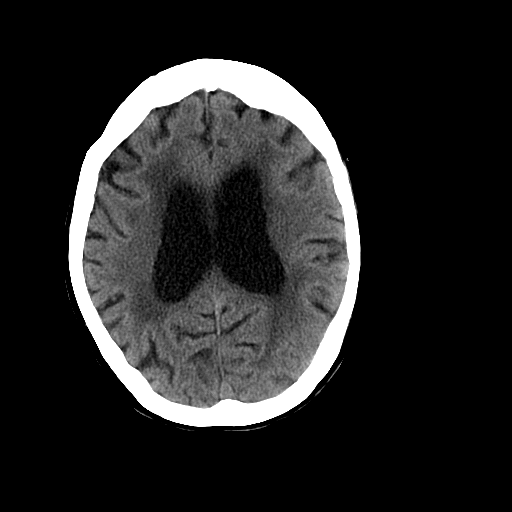
[im 18/28  brain]
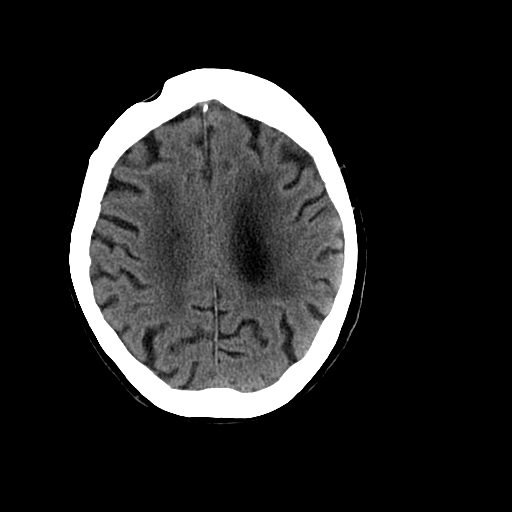
[im 18/28  bone]
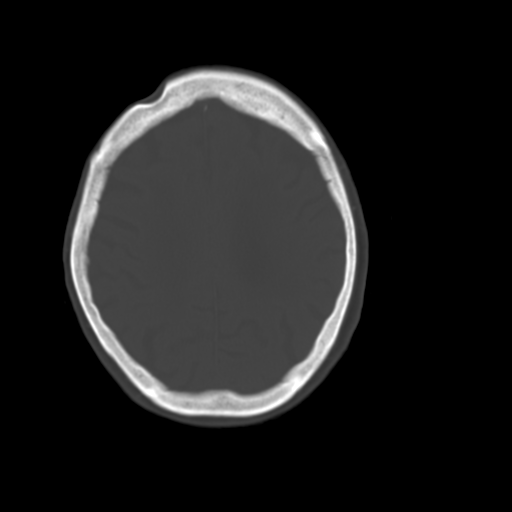
[im 20/28  brain]
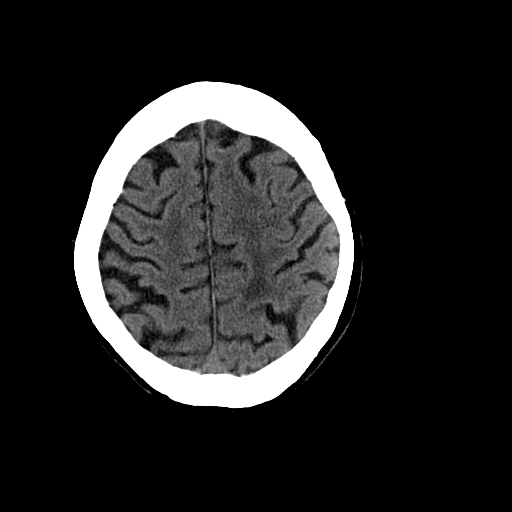
[im 22/28  brain]
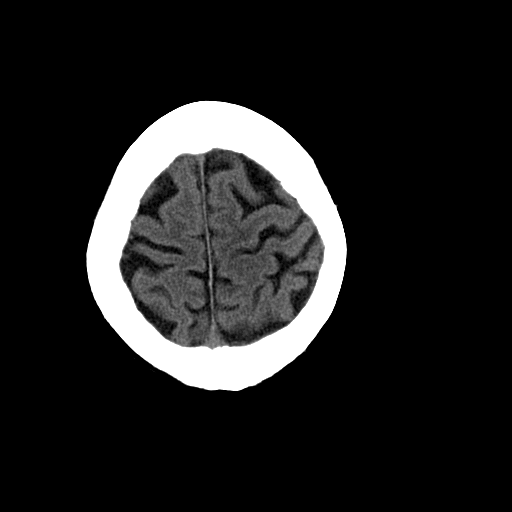
[im 24/28  brain]
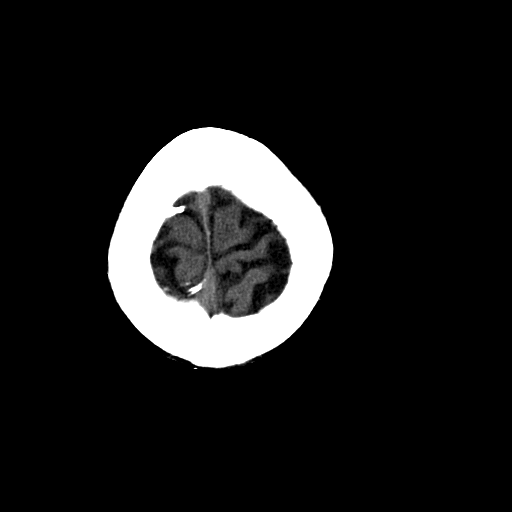
[im 26/28  brain]
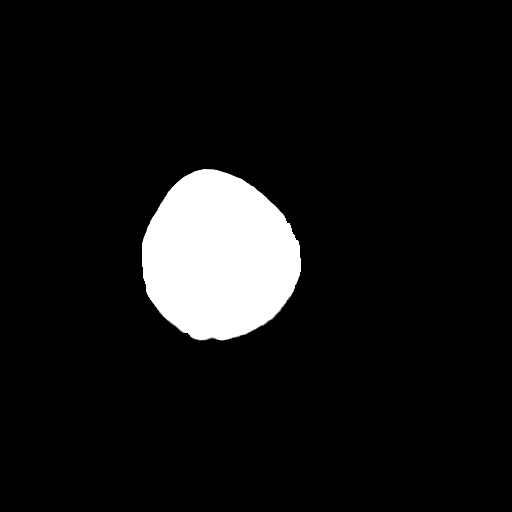
[im 26/28  bone]
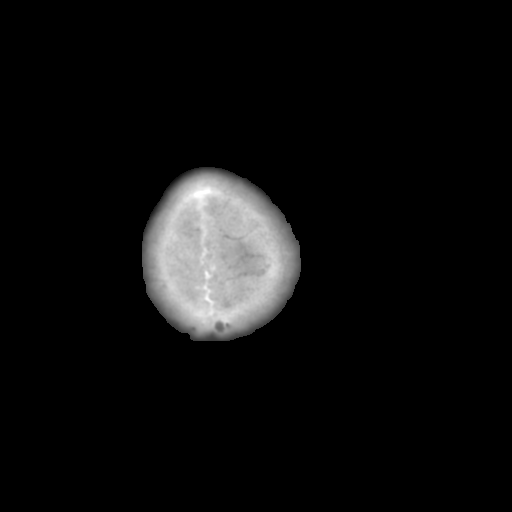

[16 of 30 positions shown; findings below may reference images not displayed]

This examination was performed at [HOSPITAL] . The
interpretation will be provided by [HOSPITAL]

## 2014-02-14 ENCOUNTER — Inpatient Hospital Stay (HOSPITAL_COMMUNITY): Payer: Medicare Other

## 2014-02-14 ENCOUNTER — Inpatient Hospital Stay (HOSPITAL_COMMUNITY)
Admission: EM | Admit: 2014-02-14 | Discharge: 2014-02-19 | DRG: 378 | Disposition: A | Discharge status: Skilled Nursing Facility | Payer: Medicare Other | Attending: Internal Medicine | Admitting: Internal Medicine

## 2014-02-14 ENCOUNTER — Encounter (HOSPITAL_COMMUNITY): Payer: Self-pay | Admitting: Emergency Medicine

## 2014-02-14 DIAGNOSIS — F0390 Unspecified dementia without behavioral disturbance: Secondary | ICD-10-CM | POA: Diagnosis present

## 2014-02-14 DIAGNOSIS — I509 Heart failure, unspecified: Secondary | ICD-10-CM | POA: Diagnosis present

## 2014-02-14 DIAGNOSIS — I10 Essential (primary) hypertension: Secondary | ICD-10-CM

## 2014-02-14 DIAGNOSIS — I129 Hypertensive chronic kidney disease with stage 1 through stage 4 chronic kidney disease, or unspecified chronic kidney disease: Secondary | ICD-10-CM | POA: Diagnosis present

## 2014-02-14 DIAGNOSIS — K922 Gastrointestinal hemorrhage, unspecified: Secondary | ICD-10-CM | POA: Diagnosis not present

## 2014-02-14 DIAGNOSIS — N179 Acute kidney failure, unspecified: Secondary | ICD-10-CM | POA: Diagnosis present

## 2014-02-14 DIAGNOSIS — M6281 Muscle weakness (generalized): Secondary | ICD-10-CM | POA: Diagnosis not present

## 2014-02-14 DIAGNOSIS — Z6821 Body mass index (BMI) 21.0-21.9, adult: Secondary | ICD-10-CM | POA: Diagnosis not present

## 2014-02-14 DIAGNOSIS — R9431 Abnormal electrocardiogram [ECG] [EKG]: Secondary | ICD-10-CM | POA: Diagnosis present

## 2014-02-14 DIAGNOSIS — K5791 Diverticulosis of intestine, part unspecified, without perforation or abscess with bleeding: Secondary | ICD-10-CM

## 2014-02-14 DIAGNOSIS — I4891 Unspecified atrial fibrillation: Secondary | ICD-10-CM | POA: Diagnosis present

## 2014-02-14 DIAGNOSIS — I5032 Chronic diastolic (congestive) heart failure: Secondary | ICD-10-CM | POA: Diagnosis present

## 2014-02-14 DIAGNOSIS — K921 Melena: Secondary | ICD-10-CM | POA: Diagnosis not present

## 2014-02-14 DIAGNOSIS — K219 Gastro-esophageal reflux disease without esophagitis: Secondary | ICD-10-CM | POA: Diagnosis present

## 2014-02-14 DIAGNOSIS — K5792 Diverticulitis of intestine, part unspecified, without perforation or abscess without bleeding: Secondary | ICD-10-CM | POA: Diagnosis not present

## 2014-02-14 DIAGNOSIS — N181 Chronic kidney disease, stage 1: Secondary | ICD-10-CM | POA: Diagnosis present

## 2014-02-14 DIAGNOSIS — Z22322 Carrier or suspected carrier of Methicillin resistant Staphylococcus aureus: Secondary | ICD-10-CM

## 2014-02-14 DIAGNOSIS — N390 Urinary tract infection, site not specified: Secondary | ICD-10-CM | POA: Diagnosis not present

## 2014-02-14 DIAGNOSIS — K625 Hemorrhage of anus and rectum: Secondary | ICD-10-CM | POA: Diagnosis not present

## 2014-02-14 DIAGNOSIS — R2681 Unsteadiness on feet: Secondary | ICD-10-CM | POA: Diagnosis not present

## 2014-02-14 DIAGNOSIS — K8020 Calculus of gallbladder without cholecystitis without obstruction: Secondary | ICD-10-CM | POA: Diagnosis not present

## 2014-02-14 DIAGNOSIS — K5731 Diverticulosis of large intestine without perforation or abscess with bleeding: Secondary | ICD-10-CM | POA: Diagnosis not present

## 2014-02-14 DIAGNOSIS — R278 Other lack of coordination: Secondary | ICD-10-CM | POA: Diagnosis not present

## 2014-02-14 DIAGNOSIS — B961 Klebsiella pneumoniae [K. pneumoniae] as the cause of diseases classified elsewhere: Secondary | ICD-10-CM | POA: Diagnosis present

## 2014-02-14 DIAGNOSIS — N189 Chronic kidney disease, unspecified: Secondary | ICD-10-CM

## 2014-02-14 DIAGNOSIS — R58 Hemorrhage, not elsewhere classified: Secondary | ICD-10-CM | POA: Diagnosis not present

## 2014-02-14 DIAGNOSIS — D649 Anemia, unspecified: Secondary | ICD-10-CM | POA: Diagnosis not present

## 2014-02-14 DIAGNOSIS — I120 Hypertensive chronic kidney disease with stage 5 chronic kidney disease or end stage renal disease: Secondary | ICD-10-CM | POA: Diagnosis not present

## 2014-02-14 DIAGNOSIS — D62 Acute posthemorrhagic anemia: Secondary | ICD-10-CM | POA: Diagnosis not present

## 2014-02-14 HISTORY — DX: Gastrointestinal hemorrhage, unspecified: K92.2

## 2014-02-14 LAB — CBC
HEMATOCRIT: 24.6 % — AB (ref 36.0–46.0)
Hemoglobin: 8.2 g/dL — ABNORMAL LOW (ref 12.0–15.0)
MCH: 31.3 pg (ref 26.0–34.0)
MCHC: 33.3 g/dL (ref 30.0–36.0)
MCV: 93.9 fL (ref 78.0–100.0)
PLATELETS: 226 10*3/uL (ref 150–400)
RBC: 2.62 MIL/uL — ABNORMAL LOW (ref 3.87–5.11)
RDW: 13.7 % (ref 11.5–15.5)
WBC: 7.9 10*3/uL (ref 4.0–10.5)

## 2014-02-14 LAB — BASIC METABOLIC PANEL
Anion gap: 10 (ref 5–15)
BUN: 17 mg/dL (ref 6–23)
CALCIUM: 9.1 mg/dL (ref 8.4–10.5)
CHLORIDE: 105 meq/L (ref 96–112)
CO2: 25 meq/L (ref 19–32)
Creatinine, Ser: 0.67 mg/dL (ref 0.50–1.10)
GFR calc Af Amer: 85 mL/min — ABNORMAL LOW (ref >90)
GFR calc non Af Amer: 73 mL/min — ABNORMAL LOW (ref >90)
Glucose, Bld: 102 mg/dL — ABNORMAL HIGH (ref 70–99)
Potassium: 4.4 mEq/L (ref 3.7–5.3)
Sodium: 140 mEq/L (ref 137–147)

## 2014-02-14 LAB — CBC WITH DIFFERENTIAL/PLATELET
BASOS PCT: 0 % (ref 0–1)
Basophils Absolute: 0 10*3/uL (ref 0.0–0.1)
Eosinophils Absolute: 0.2 10*3/uL (ref 0.0–0.7)
Eosinophils Relative: 3 % (ref 0–5)
HEMATOCRIT: 31 % — AB (ref 36.0–46.0)
Hemoglobin: 10.2 g/dL — ABNORMAL LOW (ref 12.0–15.0)
LYMPHS PCT: 29 % (ref 12–46)
Lymphs Abs: 2.5 10*3/uL (ref 0.7–4.0)
MCH: 31.1 pg (ref 26.0–34.0)
MCHC: 32.9 g/dL (ref 30.0–36.0)
MCV: 94.5 fL (ref 78.0–100.0)
MONOS PCT: 11 % (ref 3–12)
Monocytes Absolute: 1 10*3/uL (ref 0.1–1.0)
NEUTROS ABS: 4.8 10*3/uL (ref 1.7–7.7)
Neutrophils Relative %: 57 % (ref 43–77)
Platelets: 278 10*3/uL (ref 150–400)
RBC: 3.28 MIL/uL — ABNORMAL LOW (ref 3.87–5.11)
RDW: 13.6 % (ref 11.5–15.5)
WBC: 8.5 10*3/uL (ref 4.0–10.5)

## 2014-02-14 LAB — URINE MICROSCOPIC-ADD ON

## 2014-02-14 LAB — URINALYSIS, ROUTINE W REFLEX MICROSCOPIC
BILIRUBIN URINE: NEGATIVE
Glucose, UA: NEGATIVE mg/dL
Ketones, ur: NEGATIVE mg/dL
NITRITE: POSITIVE — AB
PH: 6 (ref 5.0–8.0)
Protein, ur: NEGATIVE mg/dL
Specific Gravity, Urine: 1.022 (ref 1.005–1.030)
Urobilinogen, UA: 0.2 mg/dL (ref 0.0–1.0)

## 2014-02-14 LAB — MRSA PCR SCREENING: MRSA BY PCR: POSITIVE — AB

## 2014-02-14 LAB — I-STAT CG4 LACTIC ACID, ED: LACTIC ACID, VENOUS: 0.91 mmol/L (ref 0.5–2.2)

## 2014-02-14 MED ORDER — ACETAMINOPHEN 650 MG RE SUPP: 650.0000 mg | Freq: Four times a day (QID) | RECTAL | Status: DC | PRN

## 2014-02-14 MED ORDER — IOHEXOL 300 MG/ML  SOLN
25.0000 mL | Freq: Once | INTRAMUSCULAR | Status: AC | PRN
  Administered 2014-02-14: 25 mL via ORAL

## 2014-02-14 MED ORDER — ACETAMINOPHEN 325 MG PO TABS: 650.0000 mg | ORAL_TABLET | Freq: Four times a day (QID) | ORAL | Status: DC | PRN

## 2014-02-14 MED ORDER — DEXTROSE-NACL 5-0.45 % IV SOLN: INTRAVENOUS | Status: AC

## 2014-02-14 MED ORDER — SODIUM CHLORIDE 0.9 % IV SOLN
INTRAVENOUS | Status: DC
  Administered 2014-02-14: 1000 mL via INTRAVENOUS
  Administered 2014-02-15: 05:00:00 via INTRAVENOUS

## 2014-02-14 MED ORDER — DEXTROSE 5 % IV SOLN
1.0000 g | Freq: Once | INTRAVENOUS | Status: AC
  Administered 2014-02-14: 1 g via INTRAVENOUS
  Filled 2014-02-14: qty 10

## 2014-02-14 MED ORDER — CHLORHEXIDINE GLUCONATE CLOTH 2 % EX PADS
6.0000 | MEDICATED_PAD | Freq: Every day | CUTANEOUS | Status: DC
  Administered 2014-02-15 – 2014-02-19 (×4): 6 via TOPICAL

## 2014-02-14 MED ORDER — INFLUENZA VAC SPLIT QUAD 0.5 ML IM SUSY
0.5000 mL | PREFILLED_SYRINGE | INTRAMUSCULAR | Status: AC
  Administered 2014-02-15: 0.5 mL via INTRAMUSCULAR
  Filled 2014-02-14: qty 0.5

## 2014-02-14 MED ORDER — PANTOPRAZOLE SODIUM 40 MG IV SOLR
40.0000 mg | Freq: Once | INTRAVENOUS | Status: AC
  Administered 2014-02-14: 40 mg via INTRAVENOUS
  Filled 2014-02-14: qty 40

## 2014-02-14 MED ORDER — IOHEXOL 300 MG/ML  SOLN
80.0000 mL | Freq: Once | INTRAMUSCULAR | Status: AC | PRN
  Administered 2014-02-14: 80 mL via INTRAVENOUS

## 2014-02-14 MED ORDER — ONDANSETRON HCL 4 MG PO TABS: 4.0000 mg | ORAL_TABLET | Freq: Four times a day (QID) | ORAL | Status: DC | PRN

## 2014-02-14 MED ORDER — MUPIROCIN 2 % EX OINT
1.0000 "application " | TOPICAL_OINTMENT | Freq: Two times a day (BID) | CUTANEOUS | Status: AC
  Administered 2014-02-14 – 2014-02-19 (×10): 1 via NASAL
  Filled 2014-02-14 (×2): qty 22

## 2014-02-14 MED ORDER — ONDANSETRON HCL 4 MG/2ML IJ SOLN: 4.0000 mg | Freq: Four times a day (QID) | INTRAMUSCULAR | Status: DC | PRN

## 2014-02-14 MED ORDER — SODIUM CHLORIDE 0.9 % IJ SOLN
3.0000 mL | Freq: Two times a day (BID) | INTRAMUSCULAR | Status: DC
  Administered 2014-02-15 – 2014-02-19 (×7): 3 mL via INTRAVENOUS

## 2014-02-14 NOTE — ED Provider Notes (Signed)
CSN: [id number]     Arrival date & time 02/14/14  0750 History   First MD Initiated Contact with Patient 02/14/14 0818     Chief Complaint  Patient presents with  . GI Bleeding     (Consider location/radiation/quality/duration/timing/severity/associated sxs/prior Treatment) Patient is a 80 y.o. female presenting with hematochezia.  Rectal Bleeding Quality:  Maroon Amount:  Copious Duration:  2 hours Timing:  Intermittent Progression:  Worsening Chronicity:  Recurrent Context: spontaneously   Similar prior episodes: yes   Relieved by:  None tried Worsened by:  Nothing tried Ineffective treatments:  None tried Associated symptoms: no abdominal pain, no fever, no loss of consciousness, no recent illness and no vomiting   Risk factors: no anticoagulant use and no hx of colorectal surgery   Risk factors comment:  Hx of diverticulosis   Past Medical History  Diagnosis Date  . CHF (congestive heart failure)   . Hypertension   . Aneurysm 1972    cerebral  . Acid reflux     occasional  . Tremor     worse on right arm  . History of GI diverticular bleed 06/2009    colonoscopy with endo clipping of bleeding tic by Dr Clarene Essex.  Flex sig by Dr Paulita Fujita.  . Acute blood loss anemia 06/2009    received ~ 6 units blood  . Adenomatous polyp of colon 06/2009  . Diverticulitis   . UTI (lower urinary tract infection)    Past Surgical History  Procedure Laterality Date  . Hip fracture surgery  2011  . Colonoscopy w/ control of hemorrhage  06/2009  . Knee arthroscopy    . Cerebral aneurysm repair  1970's  . Colonoscopy  09/22/2011    Procedure: COLONOSCOPY;  Surgeon: Juanita Craver, MD;  Location: [facility] ENDOSCOPY;  Service: Endoscopy;  Laterality: N/A;  wants ped scope   History reviewed. No pertinent family history. History  Substance Use Topics  . Smoking status: Never Smoker   . Smokeless tobacco: Never Used  . Alcohol Use: No   OB History   Grav Para Term Preterm Abortions TAB SAB  Ect Mult Living                 Review of Systems  Constitutional: Negative for fever and chills.  HENT: Negative for congestion and sore throat.   Eyes: Negative for visual disturbance.  Respiratory: Negative for cough, shortness of breath and wheezing.   Cardiovascular: Negative for chest pain.  Gastrointestinal: Positive for blood in stool and hematochezia. Negative for nausea, vomiting, abdominal pain, diarrhea and constipation.  Genitourinary: Positive for frequency. Negative for dysuria, difficulty urinating and vaginal pain.  Musculoskeletal: Negative for arthralgias and myalgias.  Skin: Negative for rash.  Neurological: Negative for loss of consciousness, syncope and headaches.  Psychiatric/Behavioral: Negative for behavioral problems.  All other systems reviewed and are negative.     Allergies  Review of patient's allergies indicates no known allergies.  Home Medications   Prior to Admission medications   Medication Sig Start Date End Date Taking? Authorizing Provider  amLODipine (NORVASC) 5 MG tablet Take 5 mg by mouth daily.   Yes Historical Provider, MD  Ascorbic Acid (VITAMIN C) 100 MG tablet Take 100 mg by mouth daily. With rose hips 500 mg   Yes Historical Provider, MD  cholecalciferol (VITAMIN D) 1000 UNITS tablet Take 1,000 Units by mouth daily.   Yes Historical Provider, MD  docusate sodium (COLACE) 100 MG capsule Take 100 mg by mouth at bedtime.  Yes Historical Provider, MD  ferrous sulfate 325 (65 FE) MG tablet Take 325 mg by mouth daily with breakfast.   Yes Historical Provider, MD  furosemide (LASIX) 20 MG tablet Take 20 mg by mouth daily.   Yes Historical Provider, MD  OVER THE COUNTER MEDICATION Take 1 tablet by mouth daily. Calcium, Magnesium and Zinc   Yes Historical Provider, MD   BP 140/76  Pulse 82  Temp(Src) 97.8 F (36.6 C) (Oral)  Resp 18  SpO2 97% Physical Exam  Vitals reviewed. Constitutional: She is oriented to person, place, and time.  She appears well-developed and well-nourished. No distress.  HENT:  Head: Normocephalic and atraumatic.  Eyes: EOM are normal.  Neck: Normal range of motion.  Cardiovascular: Normal rate, regular rhythm and normal heart sounds.   No murmur heard. Pulmonary/Chest: Effort normal and breath sounds normal. No respiratory distress. She has no wheezes.  Abdominal: Soft. There is no tenderness.  Genitourinary:  Maroon blood in pt's diapers  Musculoskeletal: She exhibits no edema.  Neurological: She is alert and oriented to person, place, and time.  Skin: She is not diaphoretic.  Psychiatric: She has a normal mood and affect. Her behavior is normal.    ED Course  Procedures (including critical care time) Labs Review Labs Reviewed  CBC WITH DIFFERENTIAL - Abnormal; Notable for the following:    RBC 3.28 (*)    Hemoglobin 10.2 (*)    HCT 31.0 (*)    All other components within normal limits  BASIC METABOLIC PANEL - Abnormal; Notable for the following:    Glucose, Bld 102 (*)    GFR calc non Af Amer 73 (*)    GFR calc Af Amer 85 (*)    All other components within normal limits  URINALYSIS, ROUTINE W REFLEX MICROSCOPIC - Abnormal; Notable for the following:    APPearance CLOUDY (*)    Hgb urine dipstick SMALL (*)    Nitrite POSITIVE (*)    Leukocytes, UA LARGE (*)    All other components within normal limits  URINE MICROSCOPIC-ADD ON - Abnormal; Notable for the following:    Bacteria, UA MANY (*)    All other components within normal limits  URINE CULTURE  I-STAT CG4 LACTIC ACID, ED  TYPE AND SCREEN    Imaging Review No results found.   EKG Interpretation None      MDM   Final diagnoses:  Gastrointestinal hemorrhage associated with intestinal diverticulosis  UTI (lower urinary tract infection)    Patient is a 80 year old female with a history of diverticulosis and recurrent GI bleeds requiring ICU admission 2 years ago. This morning the patient's son found her in her  bed with large amounts of bright red and maroon stool in the bed. On arrival to the ED the patient is hemodynamically stable. Patient is mentating well. On exam the patient had maroon blood and clots on exam. Patient denies any belly pain nausea or vomiting at this time. On chart U. the patient had his hand diverticulosis that was unable to be embolized during last visit. Patient has not seen her GI doctor in one year. Patient's son denies any anticoagulation at this time. We will check hemoglobin and other labs. 2 large-bore IVs were placed. I will consult GI this time.  Patient's hemoglobin is 10.2. At this time we will hold off on transfusion. We'll treat patient's UTI with Rocephin.  GI evaluated the patient and we will admit the patient to medicine at this time. Patient has  remained hemodynamically stable in the ED with no additional episodes of hematochezia.  Renne Musca, MD 02/14/14 [phone]

## 2014-02-14 NOTE — Progress Notes (Signed)
Report received from [place], [place].  Will await for pt. To arrive to the floor.  Alphonzo Lemmings, RN

## 2014-02-14 NOTE — Progress Notes (Signed)
Attempted to get report from ED nurse from [phone], spoke with ED secretary twice, no answer from ED nurse.  Will call back later.  Alphonzo Lemmings, RN

## 2014-02-14 NOTE — ED Notes (Signed)
Son at bedside.

## 2014-02-14 NOTE — H&P (Signed)
History and Physical  Sarah Perkins BTD:[id number] DOB: [birth year] DOA: 02/14/2014  Referring physician: Renne Musca, ER Physician PCP: Thressa Sheller, MD   Chief Complaint: Rectal bleeding  HPI: Sarah Perkins is a 80 y.o. female  With past medical history of mild dementia, hypertension and previous episodes x2 his severe diverticular bleeding, the last one being 2 years ago which recurred endoscopic clipping and diverticulum. Patient was in her usual state of health when her son who she lives with found her in bed much bright red and maroon stool. Patient was brought into the emergency room. Vitals were stable and lab work is noted for hemoglobin of 10.2 with a normal creatinine. Patient's hemoglobin a year ago was about the same. Hospitalists were called for further evaluation and admission. GI was consulted as well.   Review of Systems:  Patient seen after arrival to floor.  She herself has no complaints. Denies any headaches, vision changes, dysphagia, chest pain, palpitations, shortness of breath, wheeze, cough, abdominal pain, dysuria, constipation or diarrhea. No focal extremity numbness or weakness or pain. Review systems otherwise negative.   Past Medical History  Diagnosis Date  . CHF (congestive heart failure)   . Hypertension   . Aneurysm 1972    cerebral  . Acid reflux     occasional  . Tremor     worse on right arm  . History of GI diverticular bleed 06/2009    colonoscopy with endo clipping of bleeding tic by Dr Clarene Essex.  Flex sig by Dr Paulita Fujita.  . Acute blood loss anemia 06/2009    received ~ 6 units blood  . Adenomatous polyp of colon 06/2009  . Diverticulitis   . UTI (lower urinary tract infection)   . GI bleed 02/14/2014   Past Surgical History  Procedure Laterality Date  . Hip fracture surgery  2011  . Colonoscopy w/ control of hemorrhage  06/2009  . Knee arthroscopy    . Cerebral aneurysm repair  1970's  . Colonoscopy  09/22/2011   Procedure: COLONOSCOPY;  Surgeon: Juanita Craver, MD;  Location: [facility] ENDOSCOPY;  Service: Endoscopy;  Laterality: N/A;  wants ped scope   Social History:  reports that she has never smoked. She has never used smokeless tobacco. She reports that she does not drink alcohol or use illicit drugs. Patient lives at home with her son & is able to participate in activities of daily living with use mostly of [place], but occasionally uses a walker or cane  No Known Allergies  Family history: Hypertension  Prior to Admission medications   Medication Sig Start Date End Date Taking? Authorizing Provider  amLODipine (NORVASC) 5 MG tablet Take 5 mg by mouth daily.   Yes Historical Provider, MD  Ascorbic Acid (VITAMIN C) 100 MG tablet Take 100 mg by mouth daily. With rose hips 500 mg   Yes Historical Provider, MD  cholecalciferol (VITAMIN D) 1000 UNITS tablet Take 1,000 Units by mouth daily.   Yes Historical Provider, MD  docusate sodium (COLACE) 100 MG capsule Take 100 mg by mouth at bedtime.   Yes Historical Provider, MD  ferrous sulfate 325 (65 FE) MG tablet Take 325 mg by mouth daily with breakfast.   Yes Historical Provider, MD  furosemide (LASIX) 20 MG tablet Take 20 mg by mouth daily.   Yes Historical Provider, MD  OVER THE COUNTER MEDICATION Take 1 tablet by mouth daily. Calcium, Magnesium and Zinc   Yes Historical Provider, MD    Physical Exam: BP 156/69  Pulse 90  Temp(Src) 98.1 F (36.7 C) (Oral)  Resp 20  Ht 5\' 7"  (1.702 m)  Wt 63.095 kg (139 lb 1.6 oz)  BMI 21.78 kg/m2  SpO2 98%  General:  Alert and oriented x2, no acute distress Eyes: Sclera nonicteric, extraocular movements are intact ENT: Normocephalic, atraumatic, mucous membranes are dry Neck: No carotid bruits Cardiovascular: Regular rate and rhythm, S1-S2 Respiratory: Clear to auscultation bilaterally Abdomen: Soft, nontender, nondistended, positive bowel sounds Skin: Fragile skin, but no skin breaks, tears or  lesions Musculoskeletal: No clubbing or cyanosis or edema Psychiatric: Patient is appropriate, no evidence of psychoses Neurologic: No focal deficits           Labs on Admission:  Basic Metabolic Panel:  Recent Labs Lab 02/14/14 0805  NA 140  K 4.4  CL 105  CO2 25  GLUCOSE 102*  BUN 17  CREATININE 0.67  CALCIUM 9.1   Liver Function Tests: No results found for this basename: AST, ALT, ALKPHOS, BILITOT, PROT, ALBUMIN,  in the last 168 hours No results found for this basename: LIPASE, AMYLASE,  in the last 168 hours No results found for this basename: AMMONIA,  in the last 168 hours CBC:  Recent Labs Lab 02/14/14 0805  WBC 8.5  NEUTROABS 4.8  HGB 10.2*  HCT 31.0*  MCV 94.5  PLT 278   Cardiac Enzymes: No results found for this basename: CKTOTAL, CKMB, CKMBINDEX, TROPONINI,  in the last 168 hours  BNP (last 3 results) No results found for this basename: PROBNP,  in the last 8760 hours CBG: No results found for this basename: GLUCAP,  in the last 168 hours  Radiological Exams on Admission: Ct Abdomen Pelvis W Contrast  02/14/2014   CLINICAL DATA:  Ladies for several months without focal site of pain; no history of nausea, vomiting, diarrhea, or constipation; history of diverticulitis and also adenomatous colonic polyps ; the  EXAM: CT ABDOMEN AND PELVIS WITH CONTRAST  TECHNIQUE: Multidetector CT imaging of the abdomen and pelvis was performed using the standard protocol following bolus administration of intravenous contrast.  CONTRAST:  44mL OMNIPAQUE IOHEXOL 300 MG/ML SOLN intravenously ; the patient also received oral contrast material.  COMPARISON:  Abdominal pelvic CT scan of January 29, 2013.  FINDINGS: There is a moderate-sized hiatal hernia -partially intra thoracic stomach. There is no evidence of a small or large bowel obstruction. There is colonic diverticulosis centered in the sigmoid colon. There is no evidence of acute diverticulitis. There is mild distention of  the rectosigmoid with stool and gas which is a stable finding. The appendix is not discretely demonstrated. The terminal ileum is unremarkable.  There are calcified stones layering in the dependent portion of the gallbladder. No pericholecystic inflammatory change is demonstrated. The liver, spleen, pancreas, adrenal glands, and kidneys exhibit no acute abnormalities. There is cortical thinning of both kidneys. There are vascular calcifications involving branches of the left renal artery. There is a 1.2 cm diameter accessory spleen. The pancreas is largely fatty infiltrated. There are stable hypodensities within the cortex of both kidneys.  The abdominal aorta is normal in caliber and exhibits mural calcification. There is no inguinal hernia. The urinary bladder is partially distended and grossly normal. The uterus is apparently surgically absent but imaging of the pelvis is limited due to beam hardening artifact from the prosthetic right hip joint. There is a small stable fat containing umbilical hernia.  There is stable 60% anterior wedge compression of the body of L1 with stable  endplate osteophyte present at T12-L1. There is degenerative disc change at L3-4, L4-5, and L5-S1. The observed portions of the bony pelvis are unremarkable. There is extensive chronic soft tissue calcification lateral to the trochanteric region of the left hip.  IMPRESSION: 1. Gallstones without objective evidence of acute cholecystitis. Chronic cholecystitis could be present. No acute abnormality of the pancreas is noted. 2. There is colonic diverticulosis without evidence of acute diverticulitis. Certainly low-grade diverticulitis could be present without significant CT findings. 3. No acute urinary tract abnormality is demonstrated. 4. Chronic 60% anterior wedge compression of the body of L1.   Electronically Signed   By: David  Martinique   On: 02/14/2014 13:25    EKG: Independently reviewed. Atrial fibrillation, rate  controlled  Assessment/Plan Present on Admission:  . GI bleed with secondary acute blood loss anemia: Likely diverticular. GI on board. Clear liquids, repeat CBC. Try conservatively managed, but if needed, Tagged blood cell scan or colonoscopy  . Dementia: Relatively alert and oriented   . HTN (hypertension): Hold Norvasc.  . CKD (chronic kidney disease), stage I: Stable  . GERD (gastroesophageal reflux disease)  Atrial fibrillation: Normal sinus rhythm for me on exam. New diagnosis if that. Continue on telemetry and monitor  Consultants: [facility]  Code Status: Limited, No CPR  Family Communication: Son at the bedside   Disposition Plan: Need to stabilize bleeding.  Time spent: 35 minutes  [place] [facility] Pager [phone]

## 2014-02-14 NOTE — Consult Note (Signed)
[facility] Consult Note  Referring Provider: No ref. provider found Primary Care Physician:  Thressa Sheller, MD Primary Gastroenterologist:  Dr.  Laurel Dimmer Complaint: Rectal bleeding HPI: Sarah Perkins is an 80 y.o. white  female  who presents with painless hematochezia. This started about 2-1/2 hours before presentation. She was found by her son with large amounts of bright red blood maroon stool in bed. His hemodynamically stable hemoglobin of 10.2. She had similar presentations in 2011 2013 and underwent colonoscopies which showed pan diverticulosis which was felt to be the source of her bleeding. On the first  occasion she went endoscopic clipping of bleeding diverticulum  Past Medical History  Diagnosis Date  . CHF (congestive heart failure)   . Hypertension   . Aneurysm 1972    cerebral  . Acid reflux     occasional  . Tremor     worse on right arm  . History of GI diverticular bleed 06/2009    colonoscopy with endo clipping of bleeding tic by Dr Clarene Essex.  Flex sig by Dr Paulita Fujita.  . Acute blood loss anemia 06/2009    received ~ 6 units blood  . Adenomatous polyp of colon 06/2009  . Diverticulitis   . UTI (lower urinary tract infection)     Past Surgical History  Procedure Laterality Date  . Hip fracture surgery  2011  . Colonoscopy w/ control of hemorrhage  06/2009  . Knee arthroscopy    . Cerebral aneurysm repair  1970's  . Colonoscopy  09/22/2011    Procedure: COLONOSCOPY;  Surgeon: Juanita Craver, MD;  Location: [facility] ENDOSCOPY;  Service: Endoscopy;  Laterality: N/A;  wants ped scope     (Not in a hospital admission)  Allergies: No Known Allergies  History reviewed. No pertinent family history.  Social History:  reports that she has never smoked. She has never used smokeless tobacco. She reports that she does not drink alcohol or use illicit drugs.  Review of Systems: negative except as above   Blood pressure 128/54, pulse 76, temperature 97.8 F  (36.6 C), temperature source Oral, resp. rate 20, SpO2 94.00%. Head: Normocephalic, without obvious abnormality, atraumatic Neck: no adenopathy, no carotid bruit, no JVD, supple, symmetrical, trachea midline and thyroid not enlarged, symmetric, no tenderness/mass/nodules Resp: clear to auscultation bilaterally Cardio: regular rate and rhythm, S1, S2 normal, no murmur, click, rub or gallop GI: Abdomen soft slightly distended, nontender  Extremities: extremities normal, atraumatic, no cyanosis or edema  Results for orders placed during the hospital encounter of 02/14/14 (from the past 48 hour(s))  TYPE AND SCREEN     Status: None   Collection Time    02/14/14  8:00 AM      Result Value Ref Range   ABO/RH(D) B POS     Antibody Screen NEG     Sample Expiration 02/17/2014    CBC WITH DIFFERENTIAL     Status: Abnormal   Collection Time    02/14/14  8:05 AM      Result Value Ref Range   WBC 8.5  4.0 - 10.5 K/uL   RBC 3.28 (*) 3.87 - 5.11 MIL/uL   Hemoglobin 10.2 (*) 12.0 - 15.0 g/dL   HCT 31.0 (*) 36.0 - 46.0 %   MCV 94.5  78.0 - 100.0 fL   MCH 31.1  26.0 - 34.0 pg   MCHC 32.9  30.0 - 36.0 g/dL   RDW 13.6  11.5 - 15.5 %   Platelets 278  150 - 400 K/uL  Neutrophils Relative % 57  43 - 77 %   Neutro Abs 4.8  1.7 - 7.7 K/uL   Lymphocytes Relative 29  12 - 46 %   Lymphs Abs 2.5  0.7 - 4.0 K/uL   Monocytes Relative 11  3 - 12 %   Monocytes Absolute 1.0  0.1 - 1.0 K/uL   Eosinophils Relative 3  0 - 5 %   Eosinophils Absolute 0.2  0.0 - 0.7 K/uL   Basophils Relative 0  0 - 1 %   Basophils Absolute 0.0  0.0 - 0.1 K/uL  BASIC METABOLIC PANEL     Status: Abnormal   Collection Time    02/14/14  8:05 AM      Result Value Ref Range   Sodium 140  137 - 147 mEq/L   Potassium 4.4  3.7 - 5.3 mEq/L   Chloride 105  96 - 112 mEq/L   CO2 25  19 - 32 mEq/L   Glucose, Bld 102 (*) 70 - 99 mg/dL   BUN 17  6 - 23 mg/dL   Creatinine, Ser 0.67  0.50 - 1.10 mg/dL   Calcium 9.1  8.4 - 10.5 mg/dL    GFR calc non Af Amer 73 (*) >90 mL/min   GFR calc Af Amer 85 (*) >90 mL/min   Comment: (NOTE)     The eGFR has been calculated using the CKD EPI equation.     This calculation has not been validated in all clinical situations.     eGFR's persistently <90 mL/min signify possible Chronic Kidney     Disease.   Anion gap 10  5 - 15  I-STAT CG4 LACTIC ACID, ED     Status: None   Collection Time    02/14/14  8:35 AM      Result Value Ref Range   Lactic Acid, Venous 0.91  0.5 - 2.2 mmol/L  URINALYSIS, ROUTINE W REFLEX MICROSCOPIC     Status: Abnormal   Collection Time    02/14/14  9:03 AM      Result Value Ref Range   Color, Urine YELLOW  YELLOW   APPearance CLOUDY (*) CLEAR   Specific Gravity, Urine 1.022  1.005 - 1.030   pH 6.0  5.0 - 8.0   Glucose, UA NEGATIVE  NEGATIVE mg/dL   Hgb urine dipstick SMALL (*) NEGATIVE   Bilirubin Urine NEGATIVE  NEGATIVE   Ketones, ur NEGATIVE  NEGATIVE mg/dL   Protein, ur NEGATIVE  NEGATIVE mg/dL   Urobilinogen, UA 0.2  0.0 - 1.0 mg/dL   Nitrite POSITIVE (*) NEGATIVE   Leukocytes, UA LARGE (*) NEGATIVE  URINE MICROSCOPIC-ADD ON     Status: Abnormal   Collection Time    02/14/14  9:03 AM      Result Value Ref Range   Squamous Epithelial / LPF RARE  RARE   WBC, UA TOO NUMEROUS TO COUNT  <3 WBC/hpf   RBC / HPF 3-6  <3 RBC/hpf   Bacteria, UA MANY (*) RARE   No results found.  Assessment: Painless hematochezia in a 80 year old patient with previous diverticular bleeds in 2011 and 2013, likely representing same Plan:  Conservative management initially, will monitor stools and hemoglobin, avoid anticoagulants and pursue colonoscopy or tagged RBC scan if bleeding does not cease or patient becomes unstable. will allow clear liquid diet  Ewin Rehberg C 02/14/2014, 12:38 PM

## 2014-02-14 NOTE — ED Notes (Signed)
Attempted report 

## 2014-02-14 NOTE — Progress Notes (Signed)
Sarah Perkins [id number] Admitted to 5W 17: 02/14/2014 4:06 PM Attending Provider: Annita Brod, MD    Sarah Perkins is a 80 y.o. female patient admitted from ED awake, alert  & orientated  X 2, self & place, has history of dementia per son,  Partial Code, VSS - Blood pressure 156/69, pulse 90, temperature 98.1 F (36.7 C), temperature source Oral, resp. rate 20, height 5\' 7"  (1.702 m), weight 63.095 kg (139 lb 1.6 oz), SpO2 98.00%., R/A, no c/o shortness of breath, no c/o chest pain, no distress noted.  No Tele orders at present.   IV site WDL:  forearm right, condition patent and no redness with a transparent dsg that's clean dry and intact.  Allergies:  No Known Allergies   Past Medical History  Diagnosis Date  . CHF (congestive heart failure)   . Hypertension   . Aneurysm 1972    cerebral  . Acid reflux     occasional  . Tremor     worse on right arm  . History of GI diverticular bleed 06/2009    colonoscopy with endo clipping of bleeding tic by Dr Clarene Essex.  Flex sig by Dr Paulita Fujita.  . Acute blood loss anemia 06/2009    received ~ 6 units blood  . Adenomatous polyp of colon 06/2009  . Diverticulitis   . UTI (lower urinary tract infection)   . GI bleed 02/14/2014    History:  obtained from the patient & son by [employer].Marland Kitchen  Pt orientation to unit, room and routine. Information packet given to patient/family and safety video watched.  Admission INP armband ID verified with patient/family, and in place. SR up x 2, fall risk assessment complete with Patient and family verbalizing understanding of risks associated with falls. Pt verbalizes an understanding of how to use the call bell and to call for help before getting out of bed.  Skin, clean-dry- intact without evidence of bruising, or skin tears.   No evidence of skin break down noted on exam.  Text Paged Dr. Lyman Speller to let him know pt. Has arrived to unit.    Will cont to monitor and assist as  needed.  Alphonzo Lemmings C, RN 02/14/2014 4:06 PM2

## 2014-02-14 NOTE — ED Provider Notes (Signed)
80 y.o. Female with history of diverticular bleeding presents today with brbpr unknown number of times with blood in bed this am.  Patient denies complaints of chest pain, dyspnea, weakness, abdominal pain.  Filed Vitals:   02/14/14 0900  BP: 117/90  Pulse: 86  Temp:   Resp: 13   Elderly female awake in bed nad cv- rrr Abdomen, slighlty distended, mild diffuse lower abdominal ttp.  I saw and evaluated the patient, reviewed the resident's note and I agree with the findings and plan.   EKG Interpretation   Date/Time:  Monday February 14 2014 11:43:02 EDT Ventricular Rate:  73 PR Interval:    QRS Duration: 90 QT Interval:  428 QTC Calculation: 472 R Axis:   86 Text Interpretation:  Normal sinus rhythm Poor data quality No significant  change since Confirmed by Joci Dress MD, Andee Poles [phone]) on 02/14/2014 4:00:45  PM     I saw and evaluated the patient, reviewed the resident's note and I agree with the findings and plan.   EKG Interpretation   Date/Time:  Monday February 14 2014 11:43:02 EDT Ventricular Rate:  73 PR Interval:    QRS Duration: 90 QT Interval:  428 QTC Calculation: 472 R Axis:   86 Text Interpretation:  Normal sinus rhythm Poor data quality No significant  change since Confirmed by Verlon Carcione MD, Andee Poles ([id number]) on 02/14/2014 4:00:45  PM     GI consulted and hospitalist to admit.  Patient remains hemodynamically stable in ed.     Shaune Pollack, MD 02/15/14 [phone]

## 2014-02-14 NOTE — ED Provider Notes (Signed)
See my note  I saw and evaluated the patient, reviewed the resident's note and I agree with the findings and plan.   EKG Interpretation   Date/Time:  Monday February 14 2014 11:43:02 EDT Ventricular Rate:  73 PR Interval:    QRS Duration: 90 QT Interval:  428 QTC Calculation: 472 R Axis:   86 Text Interpretation:  Normal sinus rhythm Poor data quality No significant  change since Confirmed by Prateek Knipple MD, Andee Poles [phone]) on 02/14/2014 4:00:45  PM        Shaune Pollack, MD 02/14/14 1601

## 2014-02-14 NOTE — Progress Notes (Signed)
CRITICAL VALUE ALERT  Critical value received:  + MRSA swab  Date of notification:  02/14/14  Time of notification:  1640  Critical value read back:Yes.    Nurse who received alert:  Alphonzo Lemmings, RN  MD notified (1st page):  Dr. Lyman Speller text paged  Time of first page:  1645  MD notified (2nd page):  Time of second page:  Responding MD:    Time MD responded:

## 2014-02-14 NOTE — ED Notes (Signed)
Pt started having lower GI bleed in the middle of the night. Bright red blood/clots. Pt denies dizziness and orthostatic symptoms. Pt has hx of GI bleed. BP 154/70, HR 90. Pt is alert and oriented.

## 2014-02-15 DIAGNOSIS — K5731 Diverticulosis of large intestine without perforation or abscess with bleeding: Secondary | ICD-10-CM | POA: Diagnosis not present

## 2014-02-15 DIAGNOSIS — N390 Urinary tract infection, site not specified: Secondary | ICD-10-CM

## 2014-02-15 LAB — CBC WITH DIFFERENTIAL/PLATELET
Basophils Absolute: 0 10*3/uL (ref 0.0–0.1)
Basophils Relative: 0 % (ref 0–1)
Eosinophils Absolute: 0.2 10*3/uL (ref 0.0–0.7)
Eosinophils Relative: 3 % (ref 0–5)
HCT: 22.9 % — ABNORMAL LOW (ref 36.0–46.0)
Hemoglobin: 7.6 g/dL — ABNORMAL LOW (ref 12.0–15.0)
LYMPHS ABS: 1.9 10*3/uL (ref 0.7–4.0)
LYMPHS PCT: 27 % (ref 12–46)
MCH: 31.4 pg (ref 26.0–34.0)
MCHC: 33.2 g/dL (ref 30.0–36.0)
MCV: 94.6 fL (ref 78.0–100.0)
Monocytes Absolute: 0.9 10*3/uL (ref 0.1–1.0)
Monocytes Relative: 12 % (ref 3–12)
NEUTROS PCT: 58 % (ref 43–77)
Neutro Abs: 4 10*3/uL (ref 1.7–7.7)
PLATELETS: 212 10*3/uL (ref 150–400)
RBC: 2.42 MIL/uL — AB (ref 3.87–5.11)
RDW: 14 % (ref 11.5–15.5)
WBC: 7 10*3/uL (ref 4.0–10.5)

## 2014-02-15 LAB — BASIC METABOLIC PANEL
ANION GAP: 10 (ref 5–15)
BUN: 17 mg/dL (ref 6–23)
CHLORIDE: 109 meq/L (ref 96–112)
CO2: 23 meq/L (ref 19–32)
Calcium: 8.3 mg/dL — ABNORMAL LOW (ref 8.4–10.5)
Creatinine, Ser: 0.72 mg/dL (ref 0.50–1.10)
GFR calc non Af Amer: 71 mL/min — ABNORMAL LOW (ref >90)
GFR, EST AFRICAN AMERICAN: 83 mL/min — AB (ref >90)
Glucose, Bld: 87 mg/dL (ref 70–99)
Potassium: 4.1 mEq/L (ref 3.7–5.3)
Sodium: 142 mEq/L (ref 137–147)

## 2014-02-15 LAB — PREPARE RBC (CROSSMATCH)

## 2014-02-15 MED ORDER — SODIUM CHLORIDE 0.9 % IV SOLN: Freq: Once | INTRAVENOUS | Status: DC

## 2014-02-15 MED ORDER — FUROSEMIDE 10 MG/ML IJ SOLN
20.0000 mg | Freq: Once | INTRAMUSCULAR | Status: AC
  Administered 2014-02-15: 20 mg via INTRAVENOUS
  Filled 2014-02-15: qty 2

## 2014-02-15 MED ORDER — DEXTROSE 5 % IV SOLN
1.0000 g | INTRAVENOUS | Status: DC
  Administered 2014-02-15 – 2014-02-17 (×3): 1 g via INTRAVENOUS
  Filled 2014-02-15 (×3): qty 10

## 2014-02-15 NOTE — Progress Notes (Signed)
Blood completed. Second unit is done. No signs of SOB, chills, fever, or HA. CBC to be ordered

## 2014-02-15 NOTE — Progress Notes (Addendum)
PROGRESS NOTE  Sarah Perkins:[id number] DOB: 1917-12-25 DOA: 02/14/2014 PCP: Thressa Sheller, MD  HPI/Subjective: 80 yo female with CHF, dementia, HTN, and 2 previous episodes of diverticular bleed presents with a GI bleed. Two years ago was the last episode of GI bleed and this required endoscopic clipping. This event began when her son found her in bed covered in bright red blood and maroon stool.    Assessment/Plan:  GI Bleed with Acute blood loss anemia Felt to be due to diverticulosis  Hemoglobin down to 7.6 from yesterday (baseline 10.2), HCT is 22.9 down from 31.0.  CT from yesterday shows colonic diverticulosis in the sigmoid colon. This is a similar finding from her last GI bleed. No masses noted on CT.  GI consulted. Holding off on endoscopic eval at this point. Pt has advanced to full liquids.  She will be transfused 2 units of RBC starting at 09:11 today.  She will receive lasix between units.  CHF - history of mild diastolic dysfunction Chronic condition for this patient. Pt currently stable 2013 EF was 60 - 65% On lasix PO daily at home.  Currently being held while she is bleeding. Pt is on cardiac monitoring.   Hypertension Chronic condition for this patient.  Vitals signs currently stable. Pt had one elevated BP (156/69) that has since resolved.  Amlodipine currently held.  Will restart when appropriate.  UTI U/A appears positive for infection Culture shows gram negative rods.  Sensitivities pending. On rocephin.  Dementia Chronic condition for this patient. After being prompted she states that she was found by her son in bed covered in blood and that is why she is in the hospital.  Will speak to [facility] regarding health care management.    DVT Prophylaxis: SCDs  Code Status: Partial (No CPR) Family Communication: Spoke with son. Disposition Plan: Inpatient    Consultants:  Jackson Latino  Procedures:  None at this  time  Antibiotics:  Rocephin IV 1g q24hr currently being administered  Objective: Filed Vitals:   02/14/14 1415 02/14/14 1500 02/14/14 2105 02/15/14 0638  BP: 133/48 156/69 134/60 122/73  Pulse: 82 90 91 82  Temp:  98.1 F (36.7 C) 99 F (37.2 C) 97.9 F (36.6 C)  TempSrc:  Oral Oral Oral  Resp: 24 20 20 18   Height:  5\' 7"  (1.702 m)    Weight:  63.095 kg (139 lb 1.6 oz)    SpO2: 95% 98% 94% 93%    Intake/Output Summary (Last 24 hours) at 02/15/14 1042 Last data filed at 02/15/14 1036  Gross per 24 hour  Intake  88.75 ml  Output      0 ml  Net  88.75 ml   Filed Weights   02/14/14 1500  Weight: 63.095 kg (139 lb 1.6 oz)    Exam: General: Pt is resting comfortably in bed, NAD, appears stated age. Alert and oriented x2. Upon exam pts gown is wet from urinary incontinence. No evidence of acute bleeding currently.  HEENT: EOMI, Anicteic Sclera, conjunctiva with noted pallor    Cardiovascular: RRR, S1 S2 auscultated.   No lower extremity edema Respiratory: Clear to auscultation bilaterally with equal chest rise  Abdomen: Soft, nontender, nondistended, + bowel sounds      Data Reviewed: Basic Metabolic Panel:  Recent Labs Lab 02/14/14 0805 02/15/14 0717  NA 140 142  K 4.4 4.1  CL 105 109  CO2 25 23  GLUCOSE 102* 87  BUN 17 17  CREATININE 0.67 0.72  CALCIUM  9.1 8.3*   CBC:  Recent Labs Lab 02/14/14 0805 02/14/14 1911 02/15/14 0717  WBC 8.5 7.9 7.0  NEUTROABS 4.8  --  4.0  HGB 10.2* 8.2* 7.6*  HCT 31.0* 24.6* 22.9*  MCV 94.5 93.9 94.6  PLT 278 226 212    Recent Results (from the past 240 hour(s))  MRSA PCR SCREENING     Status: Abnormal   Collection Time    02/14/14  3:04 PM      Result Value Ref Range Status   MRSA by PCR POSITIVE (*) NEGATIVE Final   Comment:            The GeneXpert MRSA Assay (FDA     approved for NASAL specimens     only), is one component of a     comprehensive MRSA colonization     surveillance program. It is not      intended to diagnose MRSA     infection nor to guide or     monitor treatment for     MRSA infections.     RESULT CALLED TO, READ BACK BY AND VERIFIED WITH:     Etheleen Mayhew RN 16:40 02/14/14 ([email or web address])     Studies: Ct Abdomen Pelvis W Contrast  02/14/2014   CLINICAL DATA:  Ladies for several months without focal site of pain; no history of nausea, vomiting, diarrhea, or constipation; history of diverticulitis and also adenomatous colonic polyps ; the  EXAM: CT ABDOMEN AND PELVIS WITH CONTRAST  TECHNIQUE: Multidetector CT imaging of the abdomen and pelvis was performed using the standard protocol following bolus administration of intravenous contrast.  CONTRAST:  88mL OMNIPAQUE IOHEXOL 300 MG/ML SOLN intravenously ; the patient also received oral contrast material.  COMPARISON:  Abdominal pelvic CT scan of January 29, 2013.  FINDINGS: There is a moderate-sized hiatal hernia -partially intra thoracic stomach. There is no evidence of a small or large bowel obstruction. There is colonic diverticulosis centered in the sigmoid colon. There is no evidence of acute diverticulitis. There is mild distention of the rectosigmoid with stool and gas which is a stable finding. The appendix is not discretely demonstrated. The terminal ileum is unremarkable.  There are calcified stones layering in the dependent portion of the gallbladder. No pericholecystic inflammatory change is demonstrated. The liver, spleen, pancreas, adrenal glands, and kidneys exhibit no acute abnormalities. There is cortical thinning of both kidneys. There are vascular calcifications involving branches of the left renal artery. There is a 1.2 cm diameter accessory spleen. The pancreas is largely fatty infiltrated. There are stable hypodensities within the cortex of both kidneys.  The abdominal aorta is normal in caliber and exhibits mural calcification. There is no inguinal hernia. The urinary bladder is partially distended and grossly normal. The  uterus is apparently surgically absent but imaging of the pelvis is limited due to beam hardening artifact from the prosthetic right hip joint. There is a small stable fat containing umbilical hernia.  There is stable 60% anterior wedge compression of the body of L1 with stable endplate osteophyte present at T12-L1. There is degenerative disc change at L3-4, L4-5, and L5-S1. The observed portions of the bony pelvis are unremarkable. There is extensive chronic soft tissue calcification lateral to the trochanteric region of the left hip.  IMPRESSION: 1. Gallstones without objective evidence of acute cholecystitis. Chronic cholecystitis could be present. No acute abnormality of the pancreas is noted. 2. There is colonic diverticulosis without evidence of acute diverticulitis. Certainly low-grade diverticulitis could be present  without significant CT findings. 3. No acute urinary tract abnormality is demonstrated. 4. Chronic 60% anterior wedge compression of the body of L1.   Electronically Signed   By: David  Martinique   On: 02/14/2014 13:25    Scheduled Meds: . sodium chloride   Intravenous Once  . cefTRIAXone (ROCEPHIN)  IV  1 g Intravenous Q24H  . Chlorhexidine Gluconate Cloth  6 each Topical Q0600  . dextrose 5 % and 0.45% NaCl   Intravenous STAT  . furosemide  20 mg Intravenous Once  . Influenza vac split quadrivalent PF  0.5 mL Intramuscular Tomorrow-1000  . mupirocin ointment  1 application Nasal BID  . sodium chloride  3 mL Intravenous Q12H   Continuous Infusions: . sodium chloride 75 mL/hr at 02/15/14 6073    Principal Problem:   GI bleed Active Problems:   HTN (hypertension)   CKD (chronic kidney disease), stage I   GERD (gastroesophageal reflux disease)   Acute posthemorrhagic anemia   Dementia   GIB (gastrointestinal bleeding)   Abnormal EKG    Colbert Ewing PA-S Imogene Burn, PA-C [facility] Pager [phone]. If 7PM-7AM, please contact night-coverage at [email or web address],  password [facility] 02/15/2014, 10:42 AM  LOS: 1 day    Addendum  Patient seen and examined, chart and data base reviewed.  I agree with the above assessment and plan.  For full details please see Mrs. Imogene Burn PA note.  Bright red blood per rectum, likely diverticular bleed.  Hemoglobin dropped to 7.6, transfuse 2 units of packed RBCs, keep hemoglobin above 8.0.   Birdie Hopes, MD [facility] Pager: [phone] 02/15/2014, 5:04 PM

## 2014-02-15 NOTE — Progress Notes (Signed)
[facility] Progress Note  Subjective: Minimal rectal bleeding since yesterday no abdominal pain  Objective: Vital signs in last 24 hours: Temp:  [97.9 F (36.6 C)-99 F (37.2 C)] 97.9 F (36.6 C) (10/20 3149) Pulse Rate:  [69-94] 82 (10/20 0638) Resp:  [14-24] 18 (10/20 0638) BP: (112-156)/(45-92) 122/73 mmHg (10/20 0638) SpO2:  [92 %-98 %] 93 % (10/20 7026) Weight:  [63.095 kg (139 lb 1.6 oz)] 63.095 kg (139 lb 1.6 oz) (10/19 1500) Weight change:    PE: Abdomen soft  Lab Results: Results for orders placed during the hospital encounter of 02/14/14 (from the past 24 hour(s))  MRSA PCR SCREENING     Status: Abnormal   Collection Time    02/14/14  3:04 PM      Result Value Ref Range   MRSA by PCR POSITIVE (*) NEGATIVE  CBC     Status: Abnormal   Collection Time    02/14/14  7:11 PM      Result Value Ref Range   WBC 7.9  4.0 - 10.5 K/uL   RBC 2.62 (*) 3.87 - 5.11 MIL/uL   Hemoglobin 8.2 (*) 12.0 - 15.0 g/dL   HCT 24.6 (*) 36.0 - 46.0 %   MCV 93.9  78.0 - 100.0 fL   MCH 31.3  26.0 - 34.0 pg   MCHC 33.3  30.0 - 36.0 g/dL   RDW 13.7  11.5 - 15.5 %   Platelets 226  150 - 400 K/uL  CBC WITH DIFFERENTIAL     Status: Abnormal   Collection Time    02/15/14  7:17 AM      Result Value Ref Range   WBC 7.0  4.0 - 10.5 K/uL   RBC 2.42 (*) 3.87 - 5.11 MIL/uL   Hemoglobin 7.6 (*) 12.0 - 15.0 g/dL   HCT 22.9 (*) 36.0 - 46.0 %   MCV 94.6  78.0 - 100.0 fL   MCH 31.4  26.0 - 34.0 pg   MCHC 33.2  30.0 - 36.0 g/dL   RDW 14.0  11.5 - 15.5 %   Platelets 212  150 - 400 K/uL   Neutrophils Relative % 58  43 - 77 %   Neutro Abs 4.0  1.7 - 7.7 K/uL   Lymphocytes Relative 27  12 - 46 %   Lymphs Abs 1.9  0.7 - 4.0 K/uL   Monocytes Relative 12  3 - 12 %   Monocytes Absolute 0.9  0.1 - 1.0 K/uL   Eosinophils Relative 3  0 - 5 %   Eosinophils Absolute 0.2  0.0 - 0.7 K/uL   Basophils Relative 0  0 - 1 %   Basophils Absolute 0.0  0.0 - 0.1 K/uL  BASIC METABOLIC PANEL     Status:  Abnormal   Collection Time    02/15/14  7:17 AM      Result Value Ref Range   Sodium 142  137 - 147 mEq/L   Potassium 4.1  3.7 - 5.3 mEq/L   Chloride 109  96 - 112 mEq/L   CO2 23  19 - 32 mEq/L   Glucose, Bld 87  70 - 99 mg/dL   BUN 17  6 - 23 mg/dL   Creatinine, Ser 0.72  0.50 - 1.10 mg/dL   Calcium 8.3 (*) 8.4 - 10.5 mg/dL   GFR calc non Af Amer 71 (*) >90 mL/min   GFR calc Af Amer 83 (*) >90 mL/min   Anion gap 10  5 -  15  PREPARE RBC (CROSSMATCH)     Status: None   Collection Time    02/15/14  9:11 AM      Result Value Ref Range   Order Confirmation ORDER PROCESSED BY BLOOD BANK      Studies/Results: Ct Abdomen Pelvis W Contrast  02/14/2014   CLINICAL DATA:  Ladies for several months without focal site of pain; no history of nausea, vomiting, diarrhea, or constipation; history of diverticulitis and also adenomatous colonic polyps ; the  EXAM: CT ABDOMEN AND PELVIS WITH CONTRAST  TECHNIQUE: Multidetector CT imaging of the abdomen and pelvis was performed using the standard protocol following bolus administration of intravenous contrast.  CONTRAST:  66mL OMNIPAQUE IOHEXOL 300 MG/ML SOLN intravenously ; the patient also received oral contrast material.  COMPARISON:  Abdominal pelvic CT scan of January 29, 2013.  FINDINGS: There is a moderate-sized hiatal hernia -partially intra thoracic stomach. There is no evidence of a small or large bowel obstruction. There is colonic diverticulosis centered in the sigmoid colon. There is no evidence of acute diverticulitis. There is mild distention of the rectosigmoid with stool and gas which is a stable finding. The appendix is not discretely demonstrated. The terminal ileum is unremarkable.  There are calcified stones layering in the dependent portion of the gallbladder. No pericholecystic inflammatory change is demonstrated. The liver, spleen, pancreas, adrenal glands, and kidneys exhibit no acute abnormalities. There is cortical thinning of both  kidneys. There are vascular calcifications involving branches of the left renal artery. There is a 1.2 cm diameter accessory spleen. The pancreas is largely fatty infiltrated. There are stable hypodensities within the cortex of both kidneys.  The abdominal aorta is normal in caliber and exhibits mural calcification. There is no inguinal hernia. The urinary bladder is partially distended and grossly normal. The uterus is apparently surgically absent but imaging of the pelvis is limited due to beam hardening artifact from the prosthetic right hip joint. There is a small stable fat containing umbilical hernia.  There is stable 60% anterior wedge compression of the body of L1 with stable endplate osteophyte present at T12-L1. There is degenerative disc change at L3-4, L4-5, and L5-S1. The observed portions of the bony pelvis are unremarkable. There is extensive chronic soft tissue calcification lateral to the trochanteric region of the left hip.  IMPRESSION: 1. Gallstones without objective evidence of acute cholecystitis. Chronic cholecystitis could be present. No acute abnormality of the pancreas is noted. 2. There is colonic diverticulosis without evidence of acute diverticulitis. Certainly low-grade diverticulitis could be present without significant CT findings. 3. No acute urinary tract abnormality is demonstrated. 4. Chronic 60% anterior wedge compression of the body of L1.   Electronically Signed   By: David  Martinique   On: 02/14/2014 13:25      Assessment: Rectal bleeding probably diverticular  Plan: Hold off on endoscopic evaluation as bleeding seems to have slowed down. Transfuse 2 units of packed red blood cells. Advance to full liquid diet today    Rozena Fierro C 02/15/2014, 10:25 AM

## 2014-02-15 NOTE — Progress Notes (Signed)
Utilization review completed.  

## 2014-02-15 NOTE — Plan of Care (Signed)
Problem: Consults Goal: GI Bleeding Patient Education See Patient Education Module for education specifics.  Outcome: Completed/Met Date Met:  02/15/14 Handout given to son  Problem: Phase I Progression Outcomes Goal: Initial discharge plan identified Outcome: Completed/Met Date Met:  02/15/14 To return home with son

## 2014-02-15 NOTE — Evaluation (Signed)
Physical Therapy Evaluation Patient Details Name: Sarah Perkins MRN: [id number] DOB: 09-25-1917 Today's Date: 02/15/2014   History of Present Illness  Patient is a 80 y/o female admitted for rectal bleeding. PMH of mild dementia, HTN and previous episodes x2 of severe diverticular bleeding, the last one being 2 years ago which recurred endoscopic clipping and diverticulum. Patient was in her usual state of health when her son who she lives with found her in bed with bright red and maroon stool. Admitted with GI bleed with secondary acute blood loss anemia.    Clinical Impression  Patient presents with functional limitations due to deficits listed in PT problem list (see below). Pt with baseline cognitive deficits, generalized weakness and balance deficits impacting safe mobility. Pt uses w/c for mobility at home. Exhibits severe posterior lean upon standing requiring hands on assist for safety. Will need to determine if son is able to provide appropriate care at home as pt poor historian. Pt would benefit from acute PT and ST SNF to improve transfers, gait, overall functional mobility and ease burden of care prior to return home.    Follow Up Recommendations SNF;Supervision/Assistance - 24 hour    Equipment Recommendations  None recommended by PT    Recommendations for Other Services OT consult     Precautions / Restrictions Precautions Precautions: Fall Restrictions Weight Bearing Restrictions: No      Mobility  Bed Mobility Overal bed mobility: Needs Assistance Bed Mobility: Supine to Sit     Supine to sit: Mod assist;HOB elevated     General bed mobility comments: Use of rails. Mod A for trunk and scooting bottom to EOB. Increased time. Posterior lean initially.  Transfers Overall transfer level: Needs assistance Equipment used: Rolling walker (2 wheeled) Transfers: Sit to/from Stand Sit to Stand: Mod assist         General transfer comment: Stood from EOB  x2. Mod A to elevate trunk with severe posterior trunk lean.  Ambulation/Gait Ambulation/Gait assistance: Min assist Ambulation Distance (Feet): 2 Feet Assistive device: Rolling walker (2 wheeled) Gait Pattern/deviations: Step-to pattern;Decreased stride length;Leaning posteriorly;Shuffle     General Gait Details: Severe posterior trunk lean upon standing, assist to negotiate RW and for weightshifting to pick up LEs. Able to take a few steps to chair with Min A.  Stairs            Wheelchair Mobility    Modified Rankin (Stroke Patients Only)       Balance Overall balance assessment: Needs assistance   Sitting balance-Leahy Scale: Fair     Standing balance support: During functional activity;Bilateral upper extremity supported Standing balance-Leahy Scale: Poor Standing balance comment: Requires Mod A to maintain static standing due to posterior lean and posterior bias even with cues to lean forward. Use of RW for UE support. Able to stand for ~1-2 minutes during pericare.                             Pertinent Vitals/Pain Pain Assessment: No/denies pain    Home Living Family/patient expects to be discharged to:: Private residence Living Arrangements: Children Available Help at Discharge: Family;Available 24 hours/day Type of Home: House Home Access:  (not sure if there are steps to enter as pt not able to report.)     Home Layout: One level Home Equipment: Wheelchair - [employer] - 2 wheels      Prior Function Level of Independence: Needs assistance   Gait /  Transfers Assistance Needed: Assist for SPT, minimal walking per son. Mainly uses w/c for all mobility.     Comments: Difficult to obtain accurate PLOF/home setup as pt with hx of dementia and son not present.     Hand Dominance        Extremity/Trunk Assessment   Upper Extremity Assessment: Generalized weakness           Lower Extremity Assessment: Generalized weakness          Communication   Communication: HOH  Cognition Arousal/Alertness: Awake/alert Behavior During Therapy: WFL for tasks assessed/performed Overall Cognitive Status: No family/caregiver present to determine baseline cognitive functioning                      General Comments General comments (skin integrity, edema, etc.): Incontinent of urine upon entering room. Incontinent of stool once sitting EOB. Pt unaware and unable to report accidents. Tech assisted therapist clean patient up and change gown/pads.    Exercises General Exercises - Lower Extremity Ankle Circles/Pumps: Both;10 reps;Seated Long Arc Quad: Both;10 reps;Seated Hip Flexion/Marching: Both;10 reps;Seated      Assessment/Plan    PT Assessment Patient needs continued PT services  PT Diagnosis Difficulty walking;Generalized weakness   PT Problem List Decreased strength;Decreased cognition;Decreased activity tolerance;Decreased knowledge of use of DME;Decreased safety awareness;Decreased balance;Decreased mobility  PT Treatment Interventions Balance training;Gait training;Patient/family education;Functional mobility training;Therapeutic activities;Therapeutic exercise;Neuromuscular re-education   PT Goals (Current goals can be found in the Care Plan section) Acute Rehab PT Goals Patient Stated Goal: to go home and cook supper PT Goal Formulation: Patient unable to participate in goal setting Time For Goal Achievement: 03/01/14 Potential to Achieve Goals: Fair    Frequency Min 3X/week   Barriers to discharge        Co-evaluation               End of Session Equipment Utilized During Treatment: Gait belt Activity Tolerance: Patient tolerated treatment well Patient left: in chair;with call bell/phone within reach;with chair alarm set Nurse Communication: Mobility status;Precautions         Time: 6962-9528 PT Time Calculation (min): 28 min   Charges:   PT Evaluation $Initial PT Evaluation  Tier I: 1 Procedure PT Treatments $Therapeutic Activity: 8-22 mins   PT G CodesCandy Sledge A 02/15/2014, 3:31 PM Candy Sledge, [place], DPT [phone]

## 2014-02-15 NOTE — Progress Notes (Signed)
RN spoke with Imogene Burn, PAC to clarify the duration to transfuse each unit of blood.  New blood transfusion orders placed in [facility]. Pt. To have 2 units of blood total.  Alphonzo Lemmings, RN

## 2014-02-16 LAB — CBC
HCT: 26.4 % — ABNORMAL LOW (ref 36.0–46.0)
Hemoglobin: 9.2 g/dL — ABNORMAL LOW (ref 12.0–15.0)
MCH: 31.7 pg (ref 26.0–34.0)
MCHC: 34.8 g/dL (ref 30.0–36.0)
MCV: 91 fL (ref 78.0–100.0)
Platelets: 187 10*3/uL (ref 150–400)
RBC: 2.9 MIL/uL — ABNORMAL LOW (ref 3.87–5.11)
RDW: 14.4 % (ref 11.5–15.5)
WBC: 9.7 10*3/uL (ref 4.0–10.5)

## 2014-02-16 LAB — URINE CULTURE

## 2014-02-16 LAB — HEMOGLOBIN AND HEMATOCRIT, BLOOD
HCT: 25.6 % — ABNORMAL LOW (ref 36.0–46.0)
HEMATOCRIT: 25.5 % — AB (ref 36.0–46.0)
HEMOGLOBIN: 8.9 g/dL — AB (ref 12.0–15.0)
Hemoglobin: 8.8 g/dL — ABNORMAL LOW (ref 12.0–15.0)

## 2014-02-16 NOTE — Care Management Note (Unsigned)
    Page 1 of 1   02/17/2014     3:03:01 PM CARE MANAGEMENT NOTE 02/17/2014  Patient:  Sarah Perkins, Sarah Perkins   Account Number:  [id number]  Date Initiated:  02/16/2014  Documentation initiated by:  Tomi Bamberger  Subjective/Objective Assessment:   dx gib  admit- lives with son     Action/Plan:   pt eval- rec snf   Anticipated DC Date:  02/17/2014   Anticipated DC Plan:  SKILLED NURSING FACILITY  In-house referral  Clinical Social Worker      DC Planning Services  CM consult      Choice offered to / List presented to:             Status of service:  In process, will continue to follow Medicare Important Message given?  YES (If response is "NO", the following Medicare IM given date fields will be blank) Date Medicare IM given:  02/17/2014 Medicare IM given by:  Tomi Bamberger Date Additional Medicare IM given:   Additional Medicare IM given by:    Discharge Disposition:    Per UR Regulation:  Reviewed for med. necessity/level of care/duration of stay  If discussed at [place] of Stay Meetings, dates discussed:    Comments:  02/16/14 [place], BSN [phone] patient lives with son, per physical therapy eval rec SNF, CSW referral.

## 2014-02-16 NOTE — Clinical Social Work Psychosocial (Signed)
Clinical Social Work Department BRIEF PSYCHOSOCIAL ASSESSMENT 02/16/2014  Patient:  Sarah Perkins, Sarah Perkins     Account Number:  [id number]     Admit date:  02/14/2014  Clinical Social Worker:  Lovey Newcomer  Date/Time:  02/16/2014 03:21 PM  Referred by:  Physician  Date Referred:  02/16/2014 Referred for  SNF Placement   Other Referral:   NA   Interview type:  Patient Other interview type:   Patient and son interviewed at bedside to complete assessment.    PSYCHOSOCIAL DATA Living Status:  FAMILY Admitted from facility:   Level of care:   Primary support name:  Charles Primary support relationship to patient:  CHILD, ADULT Degree of support available:   Support is strong.    CURRENT CONCERNS Current Concerns  Post-Acute Placement   Other Concerns:   NA    SOCIAL WORK ASSESSMENT / PLAN CSW met with patient and son at bedside. Patient states that she lives with her son [name] and [name] confirms this. Both are in agreement that short term SNF placement prior to returning home would be beneficial for patient. CSW explained SNF search/placement process and answered questions. Patient and son state that they have a preference for [facility] or [place]. Patient and son were both calm and engaged in assessment. CSW will follow up with bed offers.   Assessment/plan status:  Psychosocial Support/Ongoing Assessment of Needs Other assessment/ plan:   Complete Fl2, Fax, PASRR   Information/referral to community resources:   CSW contact information and SNF list given.    PATIENT'S/FAMILY'S RESPONSE TO PLAN OF CARE: Patient and son agreeable to SNF placement at discharge. CSW will assist as appropriate.       Liz Beach MSW, [place], [place], [phone]

## 2014-02-16 NOTE — Progress Notes (Signed)
Patient continues to bleed. Moderate amount. NP Baltazar Najjar notified. CBC ordered. Will continue to monitor

## 2014-02-16 NOTE — Progress Notes (Signed)
[facility] Progress Note  Subjective: No complaints, had a bloody stool yesterday at 6 PM no bowel movement since  Objective: Vital signs in last 24 hours: Temp:  [97.3 F (36.3 C)-99.1 F (37.3 C)] 98.6 F (37 C) (10/21 8413) Pulse Rate:  [74-101] 74 (10/21 0652) Resp:  [18-22] 20 (10/21 0652) BP: (132-173)/(38-77) 132/62 mmHg (10/21 0652) SpO2:  [95 %-100 %] 100 % (10/21 2440) Weight change:    PE: Abdomen soft, alert and oriented  Lab Results: Results for orders placed during the hospital encounter of 02/14/14 (from the past 24 hour(s))  CBC     Status: Abnormal   Collection Time    02/16/14  2:16 AM      Result Value Ref Range   WBC 9.7  4.0 - 10.5 K/uL   RBC 2.90 (*) 3.87 - 5.11 MIL/uL   Hemoglobin 9.2 (*) 12.0 - 15.0 g/dL   HCT 26.4 (*) 36.0 - 46.0 %   MCV 91.0  78.0 - 100.0 fL   MCH 31.7  26.0 - 34.0 pg   MCHC 34.8  30.0 - 36.0 g/dL   RDW 14.4  11.5 - 15.5 %   Platelets 187  150 - 400 K/uL  HEMOGLOBIN AND HEMATOCRIT, BLOOD     Status: Abnormal   Collection Time    02/16/14  6:38 AM      Result Value Ref Range   Hemoglobin 8.9 (*) 12.0 - 15.0 g/dL   HCT 25.5 (*) 36.0 - 46.0 %    Studies/Results: Ct Abdomen Pelvis W Contrast  02/14/2014   CLINICAL DATA:  Ladies for several months without focal site of pain; no history of nausea, vomiting, diarrhea, or constipation; history of diverticulitis and also adenomatous colonic polyps ; the  EXAM: CT ABDOMEN AND PELVIS WITH CONTRAST  TECHNIQUE: Multidetector CT imaging of the abdomen and pelvis was performed using the standard protocol following bolus administration of intravenous contrast.  CONTRAST:  22mL OMNIPAQUE IOHEXOL 300 MG/ML SOLN intravenously ; the patient also received oral contrast material.  COMPARISON:  Abdominal pelvic CT scan of January 29, 2013.  FINDINGS: There is a moderate-sized hiatal hernia -partially intra thoracic stomach. There is no evidence of a small or large bowel obstruction. There  is colonic diverticulosis centered in the sigmoid colon. There is no evidence of acute diverticulitis. There is mild distention of the rectosigmoid with stool and gas which is a stable finding. The appendix is not discretely demonstrated. The terminal ileum is unremarkable.  There are calcified stones layering in the dependent portion of the gallbladder. No pericholecystic inflammatory change is demonstrated. The liver, spleen, pancreas, adrenal glands, and kidneys exhibit no acute abnormalities. There is cortical thinning of both kidneys. There are vascular calcifications involving branches of the left renal artery. There is a 1.2 cm diameter accessory spleen. The pancreas is largely fatty infiltrated. There are stable hypodensities within the cortex of both kidneys.  The abdominal aorta is normal in caliber and exhibits mural calcification. There is no inguinal hernia. The urinary bladder is partially distended and grossly normal. The uterus is apparently surgically absent but imaging of the pelvis is limited due to beam hardening artifact from the prosthetic right hip joint. There is a small stable fat containing umbilical hernia.  There is stable 60% anterior wedge compression of the body of L1 with stable endplate osteophyte present at T12-L1. There is degenerative disc change at L3-4, L4-5, and L5-S1. The observed portions of the bony pelvis are unremarkable. There is  extensive chronic soft tissue calcification lateral to the trochanteric region of the left hip.  IMPRESSION: 1. Gallstones without objective evidence of acute cholecystitis. Chronic cholecystitis could be present. No acute abnormality of the pancreas is noted. 2. There is colonic diverticulosis without evidence of acute diverticulitis. Certainly low-grade diverticulitis could be present without significant CT findings. 3. No acute urinary tract abnormality is demonstrated. 4. Chronic 60% anterior wedge compression of the body of L1.    Electronically Signed   By: David  Martinique   On: 02/14/2014 13:25      Assessment: Lower GI bleed very likely diverticular, status post transfusion.  Plan: Given age and recent diverticular bleed in 2013 2011, we'll continue to up bleeding will cease on its own. Reserve colonoscopy for multiple units of blood or hemodynamic instability will continue on full liquid diet for now    Lataysha Vohra C 02/16/2014, 9:26 AM

## 2014-02-16 NOTE — Progress Notes (Signed)
PROGRESS NOTE  Sarah Perkins DJS:[id number] DOB: June 18, 1917 DOA: 02/14/2014 PCP: Thressa Sheller, MD  HPI/Subjective: 80 yo female with CHF, mild dementia, hypertension, and with 2 previous episodes of diverticular bleeds presents with GI bleed. Previous episode two years ago required endoscopic clipping and 6 units of blood.Current event noticed when her son found her in bed covered in bright red blood and maroon stool.   Assessment/Plan: Lower GI Bleed:suspected Diverticulosis. Unfortunately continues to bleed-but hemodynamically stable. Transfuse prn, GI at this not recommending endoscopic studies, unless persistent bleeding or clinically deteriorates. Acute Blood Anemia: secondary to above. Given 2 units of PRBC so far,will continue to follow Hb every 6 hours, and transfuse prn. YIF:OYDXA CX- Klebsiella Pneumoniae. Sensitive to Rocephin-day 2, will plan on 3 days of treatment. Afebrile, no leukocytosis. Non toxic.  Chronic Diastolic JOI:NOMVEHMCNOB, check daily weights. Resume Lasix when able. SJG:GEZMOQHU control, BP fluctuates. Given ongoing GI bleeding, continue to monitor off antihypertensives Dementia:with mild delirium.Not on any meds currently.   DVT Prophylaxis: SCDs Code Status: Partial (No CPR) Family Communication: Spoke with son. Disposition Plan: Inpatient-home/SNF when stable.  Consultants:  Jackson Latino  Procedures:  None at this time  Antibiotics:  Rocephin IV 10/20>>stop date 10/22  Objective: Filed Vitals:   02/15/14 2018 02/15/14 2040 02/15/14 2303 02/16/14 0652  BP: 156/74 163/77 154/70 132/62  Pulse: 101 93 100 74  Temp: 97.3 F (36.3 C) 98.2 F (36.8 C) 97.7 F (36.5 C) 98.6 F (37 C)  TempSrc: Axillary Oral Axillary Oral  Resp: 20 18 20 20   Height:      Weight:      SpO2: 100% 100% 97% 100%    Intake/Output Summary (Last 24 hours) at 02/16/14 1024 Last data filed at 02/16/14 1014  Gross per 24 hour  Intake    845 ml  Output       0 ml  Net    845 ml   Filed Weights   02/14/14 1500  Weight: 63.095 kg (139 lb 1.6 oz)    Exam: General: Pt is in bed, NAD, appears stated age. Alert and oriented x2. No evidence of acute bleeding currently.  HEENT: EOMI, Anicteic Sclera, conjunctiva with slight pallor Cardiovascular: RRR, S1 S2 auscultated.   No lower extremity edema Respiratory: Clear to auscultation bilaterally with equal chest rise  Abdomen: Soft, nontender, nondistended, + bowel sounds      Data Reviewed: Basic Metabolic Panel:  Recent Labs Lab 02/14/14 0805 02/15/14 0717  NA 140 142  K 4.4 4.1  CL 105 109  CO2 25 23  GLUCOSE 102* 87  BUN 17 17  CREATININE 0.67 0.72  CALCIUM 9.1 8.3*   CBC:  Recent Labs Lab 02/14/14 0805 02/14/14 1911 02/15/14 0717 02/16/14 0216 02/16/14 0638  WBC 8.5 7.9 7.0 9.7  --   NEUTROABS 4.8  --  4.0  --   --   HGB 10.2* 8.2* 7.6* 9.2* 8.9*  HCT 31.0* 24.6* 22.9* 26.4* 25.5*  MCV 94.5 93.9 94.6 91.0  --   PLT 278 226 212 187  --     Recent Results (from the past 240 hour(s))  URINE CULTURE     Status: None   Collection Time    02/14/14  9:03 AM      Result Value Ref Range Status   Specimen Description URINE, CATHETERIZED   Final   Special Requests ADDED [id number]   Final   Culture  Setup Time     Final   Value: 02/14/2014  17:30     Performed at [place]     Final   Value: >=100,000 COLONIES/ML     Performed at [facility]   Culture     Final   Value: KLEBSIELLA PNEUMONIAE     Performed at [facility]   Report Status 02/16/2014 FINAL   Final   Organism ID, Bacteria KLEBSIELLA PNEUMONIAE   Final  MRSA PCR SCREENING     Status: Abnormal   Collection Time    02/14/14  3:04 PM      Result Value Ref Range Status   MRSA by PCR POSITIVE (*) NEGATIVE Final   Comment:            The GeneXpert MRSA Assay (FDA     approved for NASAL specimens     only), is one component of a     comprehensive MRSA  colonization     surveillance program. It is not     intended to diagnose MRSA     infection nor to guide or     monitor treatment for     MRSA infections.     RESULT CALLED TO, READ BACK BY AND VERIFIED WITH:     Etheleen Mayhew RN 16:40 02/14/14 ([email or web address])     Studies: Ct Abdomen Pelvis W Contrast  02/14/2014   CLINICAL DATA:  Ladies for several months without focal site of pain; no history of nausea, vomiting, diarrhea, or constipation; history of diverticulitis and also adenomatous colonic polyps ; the  EXAM: CT ABDOMEN AND PELVIS WITH CONTRAST  TECHNIQUE: Multidetector CT imaging of the abdomen and pelvis was performed using the standard protocol following bolus administration of intravenous contrast.  CONTRAST:  49mL OMNIPAQUE IOHEXOL 300 MG/ML SOLN intravenously ; the patient also received oral contrast material.  COMPARISON:  Abdominal pelvic CT scan of January 29, 2013.  FINDINGS: There is a moderate-sized hiatal hernia -partially intra thoracic stomach. There is no evidence of a small or large bowel obstruction. There is colonic diverticulosis centered in the sigmoid colon. There is no evidence of acute diverticulitis. There is mild distention of the rectosigmoid with stool and gas which is a stable finding. The appendix is not discretely demonstrated. The terminal ileum is unremarkable.  There are calcified stones layering in the dependent portion of the gallbladder. No pericholecystic inflammatory change is demonstrated. The liver, spleen, pancreas, adrenal glands, and kidneys exhibit no acute abnormalities. There is cortical thinning of both kidneys. There are vascular calcifications involving branches of the left renal artery. There is a 1.2 cm diameter accessory spleen. The pancreas is largely fatty infiltrated. There are stable hypodensities within the cortex of both kidneys.  The abdominal aorta is normal in caliber and exhibits mural calcification. There is no inguinal hernia. The urinary  bladder is partially distended and grossly normal. The uterus is apparently surgically absent but imaging of the pelvis is limited due to beam hardening artifact from the prosthetic right hip joint. There is a small stable fat containing umbilical hernia.  There is stable 60% anterior wedge compression of the body of L1 with stable endplate osteophyte present at T12-L1. There is degenerative disc change at L3-4, L4-5, and L5-S1. The observed portions of the bony pelvis are unremarkable. There is extensive chronic soft tissue calcification lateral to the trochanteric region of the left hip.  IMPRESSION: 1. Gallstones without objective evidence of acute cholecystitis. Chronic cholecystitis could be present. No acute abnormality of the pancreas is  noted. 2. There is colonic diverticulosis without evidence of acute diverticulitis. Certainly low-grade diverticulitis could be present without significant CT findings. 3. No acute urinary tract abnormality is demonstrated. 4. Chronic 60% anterior wedge compression of the body of L1.   Electronically Signed   By: David  Martinique   On: 02/14/2014 13:25    Scheduled Meds: . sodium chloride   Intravenous Once  . cefTRIAXone (ROCEPHIN)  IV  1 g Intravenous Q24H  . Chlorhexidine Gluconate Cloth  6 each Topical Q0600  . mupirocin ointment  1 application Nasal BID  . sodium chloride  3 mL Intravenous Q12H   Continuous Infusions:    Principal Problem:   GI bleed Active Problems:   HTN (hypertension)   CKD (chronic kidney disease), stage I   GERD (gastroesophageal reflux disease)   Acute posthemorrhagic anemia   Dementia   GIB (gastrointestinal bleeding)   Abnormal EKG  Colbert Ewing PA-S [facility] Pager [phone]. If 7PM-7AM, please contact night-coverage at [email or web address], password [facility] 02/16/2014, 10:24 AM  LOS: 2 days   Attending Patient was seen, examined,treatment plan was discussed with the  Advance Practice Provider.  I have directly reviewed  the clinical findings, lab, imaging studies and management of this patient in detail. I have made the necessary changes to the above noted documentation, and agree with the documentation, as recorded by the Advance Practice Provider.   In short-elderly female with Diverticular bleed/ABLA, still with bleeding-but seems to have slowed down. GI following. Plan is to transfuse prn, and avoid endoscopic studies if possible.  Nena Alexander MD [facility].

## 2014-02-17 LAB — PREPARE RBC (CROSSMATCH)

## 2014-02-17 LAB — HEMOGLOBIN AND HEMATOCRIT, BLOOD
HCT: 23.7 % — ABNORMAL LOW (ref 36.0–46.0)
HCT: 30 % — ABNORMAL LOW (ref 36.0–46.0)
HEMATOCRIT: 25.1 % — AB (ref 36.0–46.0)
HEMOGLOBIN: 10.3 g/dL — AB (ref 12.0–15.0)
Hemoglobin: 8.6 g/dL — ABNORMAL LOW (ref 12.0–15.0)
Hemoglobin: 8.2 g/dL — ABNORMAL LOW (ref 12.0–15.0)

## 2014-02-17 LAB — BASIC METABOLIC PANEL
Anion gap: 10 (ref 5–15)
BUN: 14 mg/dL (ref 6–23)
CO2: 23 meq/L (ref 19–32)
Calcium: 8.4 mg/dL (ref 8.4–10.5)
Chloride: 110 mEq/L (ref 96–112)
Creatinine, Ser: 0.67 mg/dL (ref 0.50–1.10)
GFR calc Af Amer: 85 mL/min — ABNORMAL LOW (ref >90)
GFR, EST NON AFRICAN AMERICAN: 73 mL/min — AB (ref >90)
GLUCOSE: 88 mg/dL (ref 70–99)
POTASSIUM: 4 meq/L (ref 3.7–5.3)
Sodium: 143 mEq/L (ref 137–147)

## 2014-02-17 MED ORDER — FUROSEMIDE 10 MG/ML IJ SOLN
20.0000 mg | Freq: Once | INTRAMUSCULAR | Status: AC
  Administered 2014-02-17: 20 mg via INTRAVENOUS
  Filled 2014-02-17: qty 2

## 2014-02-17 MED ORDER — DIPHENHYDRAMINE HCL 50 MG/ML IJ SOLN
25.0000 mg | Freq: Once | INTRAMUSCULAR | Status: AC
  Administered 2014-02-17: 25 mg via INTRAVENOUS
  Filled 2014-02-17: qty 1

## 2014-02-17 MED ORDER — SODIUM CHLORIDE 0.9 % IV SOLN
Freq: Once | INTRAVENOUS | Status: AC
  Administered 2014-02-17: 10:00:00 via INTRAVENOUS

## 2014-02-17 MED ORDER — ACETAMINOPHEN 325 MG PO TABS
650.0000 mg | ORAL_TABLET | Freq: Once | ORAL | Status: AC
  Administered 2014-02-17: 650 mg via ORAL
  Filled 2014-02-17: qty 2

## 2014-02-17 NOTE — Progress Notes (Signed)
[facility] Progress Note  Subjective: No new complaints. Nurse states 2 smaller bloody stools overnight and receiving another unit pRBCs for hb 8.2  Objective: Vital signs in last 24 hours: Temp:  [97.5 F (36.4 C)-98.2 F (36.8 C)] 97.5 F (36.4 C) (10/22 1000) Pulse Rate:  [77-90] 81 (10/22 1000) Resp:  [18-20] 20 (10/22 1000) BP: (137-151)/(50-80) 138/53 mmHg (10/22 1000) SpO2:  [95 %-97 %] 95 % (10/22 1000) Weight change:    UX:NATFTDDUK  Lab Results: Results for orders placed during the hospital encounter of 02/14/14 (from the past 24 hour(s))  HEMOGLOBIN AND HEMATOCRIT, BLOOD     Status: Abnormal   Collection Time    02/16/14 11:45 AM      Result Value Ref Range   Hemoglobin 8.8 (*) 12.0 - 15.0 g/dL   HCT 25.6 (*) 36.0 - 46.0 %  HEMOGLOBIN AND HEMATOCRIT, BLOOD     Status: Abnormal   Collection Time    02/17/14 12:26 AM      Result Value Ref Range   Hemoglobin 8.6 (*) 12.0 - 15.0 g/dL   HCT 25.1 (*) 36.0 - 02.5 %  BASIC METABOLIC PANEL     Status: Abnormal   Collection Time    02/17/14  5:30 AM      Result Value Ref Range   Sodium 143  137 - 147 mEq/L   Potassium 4.0  3.7 - 5.3 mEq/L   Chloride 110  96 - 112 mEq/L   CO2 23  19 - 32 mEq/L   Glucose, Bld 88  70 - 99 mg/dL   BUN 14  6 - 23 mg/dL   Creatinine, Ser 0.67  0.50 - 1.10 mg/dL   Calcium 8.4  8.4 - 10.5 mg/dL   GFR calc non Af Amer 73 (*) >90 mL/min   GFR calc Af Amer 85 (*) >90 mL/min   Anion gap 10  5 - 15  HEMOGLOBIN AND HEMATOCRIT, BLOOD     Status: Abnormal   Collection Time    02/17/14  5:30 AM      Result Value Ref Range   Hemoglobin 8.2 (*) 12.0 - 15.0 g/dL   HCT 23.7 (*) 36.0 - 46.0 %  PREPARE RBC (CROSSMATCH)     Status: None   Collection Time    02/17/14  7:59 AM      Result Value Ref Range   Order Confirmation ORDER PROCESSED BY BLOOD BANK      Studies/Results: No results found.    Assessment: Diverticular bleed, unclear whether slowing down, receiving 3rd unit of  blood. Hemodynamically stable  Plan: Will continue to monitor stools and hemoglobin, hopefully hold off on colonoscopy due to her age and typically equivocal diagnostic/therapeutic yield. Will hold at full liquids for now. Will cont to follow    Karlo Goeden C 02/17/2014, 11:05 AM

## 2014-02-17 NOTE — Progress Notes (Signed)
Physical Therapy Treatment Patient Details Name: Sarah Perkins MRN: [id number] DOB: [birth year] Today's Date: 02/17/2014    History of Present Illness Patient is a 80 y/o female admitted for rectal bleeding. PMH of mild dementia, HTN and previous episodes x2 of severe diverticular bleeding, the last one being 2 years ago which recurred endoscopic clipping and diverticulum. Patient was in her usual state of health when her son who she lives with found her in bed with bright red and maroon stool. Admitted with GI bleed with secondary acute blood loss anemia.     PT Comments    Pt requiring significant assistance with mobility at this time and incontinent.  Continue to recommend SNF placement. Pt was agreeable to work with PT and get OOB.   Follow Up Recommendations  SNF     Equipment Recommendations  None recommended by PT    Recommendations for Other Services       Precautions / Restrictions Precautions Precautions: Fall Restrictions Weight Bearing Restrictions: No    Mobility  Bed Mobility Overal bed mobility: Needs Assistance Bed Mobility: Supine to Sit     Supine to sit: Mod assist;HOB elevated     General bed mobility comments: Needed assist to get to sitting and to scoot to EOB.  Transfers Overall transfer level: Needs assistance Equipment used:  (pt held to PT's arms) Transfers: Risk manager;Sit to/from Stand Sit to Stand: Mod assist Stand pivot transfers: Mod assist       General transfer comment: pt continues to lean posterior in standing and with transfers.  Ambulation/Gait                 Stairs            Wheelchair Mobility    Modified Rankin (Stroke Patients Only)       Balance Overall balance assessment: Needs assistance Sitting-balance support: Feet supported;No upper extremity supported Sitting balance-Leahy Scale: Fair Sitting balance - Comments: Sat EOB fo about 5 minutes   Standing balance support:  Bilateral upper extremity supported Standing balance-Leahy Scale: Zero Standing balance comment: Pt stood with significant assist for several minutes while NT provided pericare.                     Cognition Arousal/Alertness: Awake/alert Behavior During Therapy: WFL for tasks assessed/performed Overall Cognitive Status: No family/caregiver present to determine baseline cognitive functioning (Impaired, h/o dementia)       Memory: Decreased short-term memory              Exercises General Exercises - Lower Extremity Ankle Circles/Pumps: AROM;Both;10 reps Long Arc Quad: AROM;Both;10 reps;Seated    General Comments General comments (skin integrity, edema, etc.): Pt was incontinent of both stool and urine when standing twice      Pertinent Vitals/Pain Pain Assessment: 0-10 Pain Score:  (pt unable to report a pain number,did report no pain at rest) Pain Location: back, where PT was helping to assist pt instanding and right lower leg Pain Intervention(s): Monitored during session;Repositioned;Other (comment)    Home Living                      Prior Function            PT Goals (current goals can now be found in the care plan section) Progress towards PT goals: Progressing toward goals    Frequency  Min 3X/week    PT Plan Current plan remains appropriate    Co-evaluation  End of Session Equipment Utilized During Treatment: Gait belt Activity Tolerance: Patient tolerated treatment well Patient left: in chair;with call bell/phone within reach;with chair alarm set     Time: 5374-8270 PT Time Calculation (min): 28 min  Charges:  $Gait Training: 23-37 mins                    G Codes:      Melvern Banker 03-02-14, 1:54 PM Lavonia Dana, PT  [phone] 03-02-2014

## 2014-02-17 NOTE — Progress Notes (Signed)
PROGRESS NOTE  Sarah Perkins GDJ:[id number] DOB: 1917/10/25 DOA: 02/14/2014 PCP: Thressa Sheller, MD  HPI/Subjective: 80 yo female with CHF, mild dementia, hypertension, and with 2 previous episodes of diverticular bleeds presents with GI bleed. Previous episode two years ago required endoscopic clipping and 6 units of blood. Current event noticed when her son found her in bed covered in bright red blood and maroon stool.   Assessment/Plan: Lower GI Bleed: Suspected Diverticulosis. Unfortunately continues to bleed-but hemodynamically stable. Transfuse 1 unit PRBC today, GI at this not recommending endoscopic studies unless persistent bleeding or clinically deteriorates. Acute Blood Anemia: Secondary to above. Given 2 units of PRBC so far,will continue to follow Hb every 6 hours, and transfuse prn.  Will transfuse 1 unit PRBC today. QQI:WLNLG CX- Klebsiella Pneumoniae. Sensitive to Rocephin-day 3, will plan on 3 days of treatment. Afebrile, no leukocytosis. Non toxic.  Chronic Diastolic CHF: Compensated, check daily weights. Lasix 20mg  injection ordered today with PRBC transfusion. HTN: Moderate control, BP fluctuates. Given ongoing GI bleeding, continue to monitor off antihypertensives Dementia: Mild delirium. Not on any meds currently.   DVT Prophylaxis: SCDs Code Status: Partial (No CPR) Family Communication: Spoke with son. Disposition Plan: Inpatient-home/SNF when stable.  Consultants:  Jackson Latino  Procedures:  None at this time  Antibiotics:  Rocephin IV 10/20>>stop date 10/22  Objective: Filed Vitals:   02/16/14 2247 02/17/14 0627 02/17/14 0945 02/17/14 1000  BP: 144/65 145/80 137/50 138/53  Pulse: 90 82 77 81  Temp: 98.1 F (36.7 C) 97.8 F (36.6 C) 98.2 F (36.8 C) 97.5 F (36.4 C)  TempSrc: Oral Oral Oral Oral  Resp: 18 18 18 20   Height:      Weight:      SpO2: 95% 96% 96% 95%    Intake/Output Summary (Last 24 hours) at 02/17/14 1059 Last data  filed at 02/17/14 1012  Gross per 24 hour  Intake    693 ml  Output      0 ml  Net    693 ml   Filed Weights   02/14/14 1500  Weight: 63.095 kg (139 lb 1.6 oz)    Exam: General: Pt is in bed, NAD, appears stated age. Alert and oriented x2. No evidence of acute bleeding currently.  HEENT: EOMI, Anicteic Sclera, conjunctiva with slight pallor Cardiovascular: RRR, S1 S2 auscultated.   No lower extremity edema Respiratory: Clear to auscultation bilaterally with equal chest rise  Abdomen: Soft, nontender, nondistended, + bowel sounds Psych: Moderate dementia.       Data Reviewed: Basic Metabolic Panel:  Recent Labs Lab 02/14/14 0805 02/15/14 0717 02/17/14 0530  NA 140 142 143  K 4.4 4.1 4.0  CL 105 109 110  CO2 25 23 23   GLUCOSE 102* 87 88  BUN 17 17 14   CREATININE 0.67 0.72 0.67  CALCIUM 9.1 8.3* 8.4   CBC:  Recent Labs Lab 02/14/14 0805 02/14/14 1911 02/15/14 0717 02/16/14 0216 02/16/14 0638 02/16/14 1145 02/17/14 0026 02/17/14 0530  WBC 8.5 7.9 7.0 9.7  --   --   --   --   NEUTROABS 4.8  --  4.0  --   --   --   --   --   HGB 10.2* 8.2* 7.6* 9.2* 8.9* 8.8* 8.6* 8.2*  HCT 31.0* 24.6* 22.9* 26.4* 25.5* 25.6* 25.1* 23.7*  MCV 94.5 93.9 94.6 91.0  --   --   --   --   PLT 278 226 212 187  --   --   --   --  Recent Results (from the past 240 hour(s))  URINE CULTURE     Status: None   Collection Time    02/14/14  9:03 AM      Result Value Ref Range Status   Specimen Description URINE, CATHETERIZED   Final   Special Requests ADDED [id number]   Final   Culture  Setup Time     Final   Value: 02/14/2014 17:30     Performed at [facility] Count     Final   Value: >=100,000 COLONIES/ML     Performed at [facility]   Culture     Final   Value: KLEBSIELLA PNEUMONIAE     Performed at [facility]   Report Status 02/16/2014 FINAL   Final   Organism ID, Bacteria KLEBSIELLA PNEUMONIAE   Final  MRSA PCR SCREENING     Status:  Abnormal   Collection Time    02/14/14  3:04 PM      Result Value Ref Range Status   MRSA by PCR POSITIVE (*) NEGATIVE Final   Comment:            The GeneXpert MRSA Assay (FDA     approved for NASAL specimens     only), is one component of a     comprehensive MRSA colonization     surveillance program. It is not     intended to diagnose MRSA     infection nor to guide or     monitor treatment for     MRSA infections.     RESULT CALLED TO, READ BACK BY AND VERIFIED WITH:     Etheleen Mayhew RN 16:40 02/14/14 ([email or web address])     Studies: Ct Abdomen Pelvis W Contrast  02/14/2014   CLINICAL DATA:  Ladies for several months without focal site of pain; no history of nausea, vomiting, diarrhea, or constipation; history of diverticulitis and also adenomatous colonic polyps ; the  EXAM: CT ABDOMEN AND PELVIS WITH CONTRAST  TECHNIQUE: Multidetector CT imaging of the abdomen and pelvis was performed using the standard protocol following bolus administration of intravenous contrast.  CONTRAST:  103mL OMNIPAQUE IOHEXOL 300 MG/ML SOLN intravenously ; the patient also received oral contrast material.  COMPARISON:  Abdominal pelvic CT scan of January 29, 2013.  FINDINGS: There is a moderate-sized hiatal hernia -partially intra thoracic stomach. There is no evidence of a small or large bowel obstruction. There is colonic diverticulosis centered in the sigmoid colon. There is no evidence of acute diverticulitis. There is mild distention of the rectosigmoid with stool and gas which is a stable finding. The appendix is not discretely demonstrated. The terminal ileum is unremarkable.  There are calcified stones layering in the dependent portion of the gallbladder. No pericholecystic inflammatory change is demonstrated. The liver, spleen, pancreas, adrenal glands, and kidneys exhibit no acute abnormalities. There is cortical thinning of both kidneys. There are vascular calcifications involving branches of the left renal artery.  There is a 1.2 cm diameter accessory spleen. The pancreas is largely fatty infiltrated. There are stable hypodensities within the cortex of both kidneys.  The abdominal aorta is normal in caliber and exhibits mural calcification. There is no inguinal hernia. The urinary bladder is partially distended and grossly normal. The uterus is apparently surgically absent but imaging of the pelvis is limited due to beam hardening artifact from the prosthetic right hip joint. There is a small stable fat containing umbilical hernia.  There is stable 60% anterior  wedge compression of the body of L1 with stable endplate osteophyte present at T12-L1. There is degenerative disc change at L3-4, L4-5, and L5-S1. The observed portions of the bony pelvis are unremarkable. There is extensive chronic soft tissue calcification lateral to the trochanteric region of the left hip.  IMPRESSION: 1. Gallstones without objective evidence of acute cholecystitis. Chronic cholecystitis could be present. No acute abnormality of the pancreas is noted. 2. There is colonic diverticulosis without evidence of acute diverticulitis. Certainly low-grade diverticulitis could be present without significant CT findings. 3. No acute urinary tract abnormality is demonstrated. 4. Chronic 60% anterior wedge compression of the body of L1.   Electronically Signed   By: David  Martinique   On: 02/14/2014 13:25    Scheduled Meds: . sodium chloride   Intravenous Once  . cefTRIAXone (ROCEPHIN)  IV  1 g Intravenous Q24H  . Chlorhexidine Gluconate Cloth  6 each Topical Q0600  . furosemide  20 mg Intravenous Once  . mupirocin ointment  1 application Nasal BID  . sodium chloride  3 mL Intravenous Q12H    Principal Problem:   GI bleed Active Problems:   HTN (hypertension)   CKD (chronic kidney disease), stage I   GERD (gastroesophageal reflux disease)   Acute posthemorrhagic anemia   Dementia   GIB (gastrointestinal bleeding)   Abnormal EKG  Adelene Idler PA-S [facility] Pager [phone]. If 7PM-7AM, please contact night-coverage at [email or web address], password [facility] 02/17/2014, 10:59 AM  LOS: 3 days   Attending Patient was seen, examined,treatment plan was discussed with the  Advance Practice Provider.  I have directly reviewed the clinical findings, lab, imaging studies and management of this patient in detail. I have made the necessary changes to the above noted documentation, and agree with the documentation, as recorded by the Advance Practice Provider.   LGI bleed seems to be slowing down, will give one more unit of PRBC today. Continue to monitor closely  Nena Alexander MD [facility].

## 2014-02-18 ENCOUNTER — Inpatient Hospital Stay (HOSPITAL_COMMUNITY): Payer: Medicare Other

## 2014-02-18 LAB — TYPE AND SCREEN
ABO/RH(D): B POS
Antibody Screen: NEGATIVE
UNIT DIVISION: 0
UNIT DIVISION: 0
Unit division: 0

## 2014-02-18 LAB — HEMOGLOBIN AND HEMATOCRIT, BLOOD
HCT: 28.7 % — ABNORMAL LOW (ref 36.0–46.0)
HCT: 30.8 % — ABNORMAL LOW (ref 36.0–46.0)
HEMATOCRIT: 32.9 % — AB (ref 36.0–46.0)
Hemoglobin: 10.4 g/dL — ABNORMAL LOW (ref 12.0–15.0)
Hemoglobin: 11.3 g/dL — ABNORMAL LOW (ref 12.0–15.0)
Hemoglobin: 9.8 g/dL — ABNORMAL LOW (ref 12.0–15.0)

## 2014-02-18 MED ORDER — FENTANYL CITRATE 0.05 MG/ML IJ SOLN
INTRAMUSCULAR | Status: AC
  Filled 2014-02-18: qty 2

## 2014-02-18 MED ORDER — LIDOCAINE HCL 1 % IJ SOLN
INTRAMUSCULAR | Status: AC
  Filled 2014-02-18: qty 20

## 2014-02-18 MED ORDER — IOHEXOL 300 MG/ML  SOLN
150.0000 mL | Freq: Once | INTRAMUSCULAR | Status: AC | PRN
  Administered 2014-02-18: 50 mL via INTRA_ARTERIAL

## 2014-02-18 MED ORDER — SODIUM CHLORIDE 0.9 % IV SOLN
INTRAVENOUS | Status: AC | PRN
  Administered 2014-02-18: 30 mL/h via INTRAVENOUS

## 2014-02-18 MED ORDER — FENTANYL CITRATE 0.05 MG/ML IJ SOLN
INTRAMUSCULAR | Status: AC | PRN
  Administered 2014-02-18: 12.5 ug via INTRAVENOUS

## 2014-02-18 MED ORDER — IOHEXOL 300 MG/ML  SOLN: 150.0000 mL | Freq: Once | INTRAMUSCULAR | Status: AC | PRN

## 2014-02-18 MED ORDER — TECHNETIUM TC 99M-LABELED RED BLOOD CELLS IV KIT
25.0000 | PACK | Freq: Once | INTRAVENOUS | Status: AC | PRN
  Administered 2014-02-18: 25 via INTRAVENOUS

## 2014-02-18 NOTE — Procedures (Signed)
Neg mesenteric angio for active bleeding No comp Stable Full report in PACS

## 2014-02-18 NOTE — Clinical Social Work Placement (Addendum)
Clinical Social Work Department CLINICAL SOCIAL WORK PLACEMENT NOTE 02/18/2014  Patient:  JAQUEL, COOMER  Account Number:  [id number] Admit date:  02/14/2014  Clinical Social Worker:  Lovey Newcomer  Date/time:  02/16/2014 04:30 PM  Clinical Social Work is seeking post-discharge placement for this patient at the following level of care:   SKILLED NURSING   (*CSW will update this form in Epic as items are completed)   02/16/2014  Patient/family provided with [place] Department of Clinical Social Work's list of facilities offering this level of care within the geographic area requested by the patient (or if unable, by the patient's family).  02/16/2014  Patient/family informed of their freedom to choose among providers that offer the needed level of care, that participate in Medicare, Medicaid or managed care program needed by the patient, have an available bed and are willing to accept the patient.  02/16/2014  Patient/family informed of [facility]' ownership interest in [facility], as well as of the fact that they are under no obligation to receive care at this facility.  PASARR submitted to EDS on  PASARR number received on   FL2 transmitted to all facilities in geographic area requested by pt/family on  02/16/2014 FL2 transmitted to all facilities within larger geographic area on   Patient informed that his/her managed care company has contracts with or will negotiate with  certain facilities, including the following:     Patient/family informed of bed offers received:  02/16/14 Patient chooses bed at [facility] Physician recommends and patient chooses bed at    Patient to be transferred to [facility]  on   Patient to be transferred to facility by Ambulance Patient and family notified of transfer on  Name of family member notified:    The following physician request were entered in Epic:   Additional Comments:    Liz Beach MSW, [place], [place], [phone]

## 2014-02-18 NOTE — Progress Notes (Signed)
[facility] Progress Note  Subjective: Doing okay on clear liquid diet a little bit confused as to when her last stool was. Last chart and stool was maroon yesterday fairly small volume. Receive 2 units packed red blood cells. Hemoglobin post transfusion is 10.4  Objective: Vital signs in last 24 hours: Temp:  [97.4 F (36.3 C)-98.3 F (36.8 C)] 97.9 F (36.6 C) (10/23 0631) Pulse Rate:  [76-85] 81 (10/23 0631) Resp:  [11-20] 18 (10/23 0631) BP: (137-156)/(50-88) 156/67 mmHg (10/23 0631) SpO2:  [94 %-98 %] 95 % (10/23 0631) Weight change:    PE: Unchanged  Lab Results: Results for orders placed during the hospital encounter of 02/14/14 (from the past 24 hour(s))  PREPARE RBC (CROSSMATCH)     Status: None   Collection Time    02/17/14  7:59 AM      Result Value Ref Range   Order Confirmation ORDER PROCESSED BY BLOOD BANK    HEMOGLOBIN AND HEMATOCRIT, BLOOD     Status: Abnormal   Collection Time    02/17/14  4:00 PM      Result Value Ref Range   Hemoglobin 10.3 (*) 12.0 - 15.0 g/dL   HCT 30.0 (*) 36.0 - 46.0 %  HEMOGLOBIN AND HEMATOCRIT, BLOOD     Status: Abnormal   Collection Time    02/18/14 12:45 AM      Result Value Ref Range   Hemoglobin 9.8 (*) 12.0 - 15.0 g/dL   HCT 28.7 (*) 36.0 - 46.0 %  HEMOGLOBIN AND HEMATOCRIT, BLOOD     Status: Abnormal   Collection Time    02/18/14  5:35 AM      Result Value Ref Range   Hemoglobin 10.4 (*) 12.0 - 15.0 g/dL   HCT 30.8 (*) 36.0 - 46.0 %    Studies/Results: No results found.    Assessment: Rectal bleeding very likely diverticular unsure whether stopped although seems to certainly have slowed a bit Some consideration for transferring her to rehabilitation today which the son is concerned about Plan: Will obtain tagged RBC study to hopefully ensure that she has quit bleeding and if so we'll advance diet and okay to transfer to rehabilitation.    Kierstan Auer C 02/18/2014, 7:13 AM

## 2014-02-18 NOTE — Progress Notes (Signed)
PROGRESS NOTE  Sarah Perkins OVF:[id number] DOB: [birth year] DOA: 02/14/2014 PCP: Thressa Sheller, MD  HPI/Subjective: 80 yo female with history of dementia, CHF, HTN, and 2 previous episodes of diverticular bleeds. Last GI bleed before this hospitalization was two years ago. Presents with lower GI bleed again after son found her in bed covered in bright red blood and maroon stool.     Assessment/Plan: Lower GI Bleed: Suspected diverticulosis cause. Has not bled since 10/22 and is hemodynamically stable. Tagged RBC study ordered by GI to be completed today. Will advance diet and transfer to rehabilitation if study ok.  Acute Blood Loss Anemia: From lower GI bleed. Has received 3 units of PRBC total. HGB is up to 10.4. Will continue to monitor.  UTI: Culture grew Klebsiella Pneumoniae. Sensitive to Rocephin. Completed 3 days of IV rocephin on 10/22. Afebrile Chronic Diastolic CHF: Compensated, recording daily weight.  Hypertension: Moderate control, BP still fluctuates. Due to recent GI bleed will continue to hold antihypertensives Dementia: Mild delirium. Not currently on any medications.    DVT Prophylaxis:  SCDs  Code Status: Partial (noCPR) Family Communication: Spoke with son at bedside.  Disposition Plan: SNF when medically appropriate.    Consultants:  Jackson Latino  Procedures:  RBC tagged study to be completed today.   Antibiotics:  None  Objective: Filed Vitals:   02/17/14 1443 02/17/14 1511 02/17/14 2107 02/18/14 0631  BP: 146/57  152/88 156/67  Pulse: 85  76 81  Temp:  97.4 F (36.3 C) 97.8 F (36.6 C) 97.9 F (36.6 C)  TempSrc:  Axillary Oral Oral  Resp: 11  18 18   Height:      Weight:      SpO2: 98%  94% 95%    Intake/Output Summary (Last 24 hours) at 02/18/14 1009 Last data filed at 02/17/14 1803  Gross per 24 hour  Intake   1040 ml  Output      0 ml  Net   1040 ml   Filed Weights   02/14/14 1500  Weight: 63.095 kg (139 lb 1.6 oz)      Exam: General: Pt is resting in bed, NAD, appears stated age. Alert and oriented x2. No evidence of acute bleeding.  HEENT:  EOMI, Anicteic Sclera, conjunctiva with slight pallor.  Neck: Supple, no JVD, no masses  Cardiovascular: RRR, S1 S2 auscultated. No lower extremity edema.  Respiratory: Clear to auscultation bilaterally with equal chest rise  Abdomen: Soft, nontender, nondistended, + bowel sounds  Psych: moderate dementia   Data Reviewed: Basic Metabolic Panel:  Recent Labs Lab 02/14/14 0805 02/15/14 0717 02/17/14 0530  NA 140 142 143  K 4.4 4.1 4.0  CL 105 109 110  CO2 25 23 23   GLUCOSE 102* 87 88  BUN 17 17 14   CREATININE 0.67 0.72 0.67  CALCIUM 9.1 8.3* 8.4   Liver Function Tests: CBC:  Recent Labs Lab 02/14/14 0805 02/14/14 1911 02/15/14 0717 02/16/14 0216  02/17/14 0026 02/17/14 0530 02/17/14 1600 02/18/14 0045 02/18/14 0535  WBC 8.5 7.9 7.0 9.7  --   --   --   --   --   --   NEUTROABS 4.8  --  4.0  --   --   --   --   --   --   --   HGB 10.2* 8.2* 7.6* 9.2*  < > 8.6* 8.2* 10.3* 9.8* 10.4*  HCT 31.0* 24.6* 22.9* 26.4*  < > 25.1* 23.7* 30.0* 28.7* 30.8*  MCV 94.5 93.9  94.6 91.0  --   --   --   --   --   --   PLT 278 226 212 187  --   --   --   --   --   --   < > = values in this interval not displayed.   Recent Results (from the past 240 hour(s))  URINE CULTURE     Status: None   Collection Time    02/14/14  9:03 AM      Result Value Ref Range Status   Specimen Description URINE, CATHETERIZED   Final   Special Requests ADDED [id number]   Final   Culture  Setup Time     Final   Value: 02/14/2014 17:30     Performed at [facility] Count     Final   Value: >=100,000 COLONIES/ML     Performed at [facility]   Culture     Final   Value: KLEBSIELLA PNEUMONIAE     Performed at [facility]   Report Status 02/16/2014 FINAL   Final   Organism ID, Bacteria KLEBSIELLA PNEUMONIAE   Final  MRSA PCR SCREENING      Status: Abnormal   Collection Time    02/14/14  3:04 PM      Result Value Ref Range Status   MRSA by PCR POSITIVE (*) NEGATIVE Final   Comment:            The GeneXpert MRSA Assay (FDA     approved for NASAL specimens     only), is one component of a     comprehensive MRSA colonization     surveillance program. It is not     intended to diagnose MRSA     infection nor to guide or     monitor treatment for     MRSA infections.     RESULT CALLED TO, READ BACK BY AND VERIFIED WITH:     Etheleen Mayhew RN 16:40 02/14/14 ([email or web address])     Studies: No results found.  Scheduled Meds: . sodium chloride   Intravenous Once  . Chlorhexidine Gluconate Cloth  6 each Topical Q0600  . mupirocin ointment  1 application Nasal BID  . sodium chloride  3 mL Intravenous Q12H   Continuous Infusions:   Principal Problem:   GI bleed Active Problems:   HTN (hypertension)   CKD (chronic kidney disease), stage I   GERD (gastroesophageal reflux disease)   Acute posthemorrhagic anemia   Dementia   GIB (gastrointestinal bleeding)   Abnormal EKG    Colbert Ewing PA-S [facility] Pager [phone]. If 7PM-7AM, please contact night-coverage at [email or web address], password [facility] 02/18/2014, 10:09 AM  LOS: 4 days   Attending Patient was seen, examined,treatment plan was discussed with the  Advance Practice Provider.  I have directly reviewed the clinical findings, lab, imaging studies and management of this patient in detail. I have made the necessary changes to the above noted documentation, and agree with the documentation, as recorded by the Advance Practice Provider.   Diverticular bleeding seems to have resolved, GI recommending a tagged RBC scan. If negative, suspect discharge to SNF on 10/24  Nena Alexander MD [facility].

## 2014-02-18 NOTE — Sedation Documentation (Signed)
Vpad and pressure held right groin by Melchor Amour RT

## 2014-02-18 NOTE — Consult Note (Signed)
Chief Complaint: Chief Complaint  Patient presents with  . GI Bleeding  Nuc Med scan + bleed  Referring Physician(s): [facility]/ Dr Amedeo Plenty  History of Present Illness: Sarah Perkins is a 80 y.o. female  Pt with 2 previous GI/diverticular bleeds NucMed scan just now is + bleeding Hg at 10.4 - after transfusion x 3 Dr Amedeo Plenty has requested Mesenteric arteriogram with possible embolization Dr Annamaria Boots has reviewed chart and imaging Feels pt is appropriate for procedure I have seen and examined pt Now scheduled for same  Past Medical History  Diagnosis Date  . CHF (congestive heart failure)   . Hypertension   . Aneurysm 1972    cerebral  . Acid reflux     occasional  . Tremor     worse on right arm  . History of GI diverticular bleed 06/2009    colonoscopy with endo clipping of bleeding tic by Dr Clarene Essex.  Flex sig by Dr Paulita Fujita.  . Acute blood loss anemia 06/2009    received ~ 6 units blood  . Adenomatous polyp of colon 06/2009  . Diverticulitis   . UTI (lower urinary tract infection)   . GI bleed 02/14/2014    Past Surgical History  Procedure Laterality Date  . Hip fracture surgery  2011  . Colonoscopy w/ control of hemorrhage  06/2009  . Knee arthroscopy    . Cerebral aneurysm repair  1970's  . Colonoscopy  09/22/2011    Procedure: COLONOSCOPY;  Surgeon: Juanita Craver, MD;  Location: [facility] ENDOSCOPY;  Service: Endoscopy;  Laterality: N/A;  wants ped scope    Allergies: Review of patient's allergies indicates no known allergies.  Medications: Prior to Admission medications   Medication Sig Start Date End Date Taking? Authorizing Provider  amLODipine (NORVASC) 5 MG tablet Take 5 mg by mouth daily.   Yes Historical Provider, MD  Ascorbic Acid (VITAMIN C) 100 MG tablet Take 100 mg by mouth daily. With rose hips 500 mg   Yes Historical Provider, MD  cholecalciferol (VITAMIN D) 1000 UNITS tablet Take 1,000 Units by mouth daily.   Yes Historical Provider, MD  docusate  sodium (COLACE) 100 MG capsule Take 100 mg by mouth at bedtime.   Yes Historical Provider, MD  ferrous sulfate 325 (65 FE) MG tablet Take 325 mg by mouth daily with breakfast.   Yes Historical Provider, MD  furosemide (LASIX) 20 MG tablet Take 20 mg by mouth daily.   Yes Historical Provider, MD  OVER THE COUNTER MEDICATION Take 1 tablet by mouth daily. Calcium, Magnesium and Zinc   Yes Historical Provider, MD    History reviewed. No pertinent family history.  History   Social History  . Marital Status: Divorced    Spouse Name: N/A    Number of Children: N/A  . Years of Education: N/A   Social History Main Topics  . Smoking status: Never Smoker   . Smokeless tobacco: Never Used  . Alcohol Use: No  . Drug Use: No  . Sexual Activity: No   Other Topics Concern  . None   Social History Narrative  . None     Review of Systems: A 12 point ROS discussed and pertinent positives are indicated in the HPI above.  All other systems are negative.  Review of Systems  Constitutional: Positive for activity change, appetite change and fatigue.  Respiratory: Negative for shortness of breath.   Cardiovascular: Negative for chest pain.  Gastrointestinal: Positive for blood in stool and anal  bleeding.  Genitourinary: Negative for difficulty urinating.  Musculoskeletal: Negative for back pain.  Psychiatric/Behavioral: Negative for behavioral problems.    Vital Signs: BP 167/87  Pulse 88  Temp(Src) 98.2 F (36.8 C) (Oral)  Resp 18  Ht 5\' 7"  (1.702 m)  Wt 63.095 kg (139 lb 1.6 oz)  BMI 21.78 kg/m2  SpO2 96%  Physical Exam  Constitutional: She is oriented to person, place, and time. She appears well-developed.  Cardiovascular: Normal rate and regular rhythm.   Pulmonary/Chest: Effort normal and breath sounds normal. She has no wheezes.  Abdominal: Soft. Bowel sounds are normal. There is no tenderness.  Musculoskeletal: Normal range of motion.  Neurological: She is alert and oriented  to person, place, and time.  Skin: Skin is warm and dry. There is pallor.  Psychiatric: She has a normal mood and affect. Her behavior is normal. Judgment normal.  Consented son in room Hx dementia in pt    Imaging: Nm Gi Blood Loss  02/18/2014   CLINICAL DATA:  80 year old with the acute GI bleeding. History of diverticular bleeding.  EXAM: NUCLEAR MEDICINE GASTROINTESTINAL BLEEDING SCAN  TECHNIQUE: Sequential abdominal images were obtained following intravenous administration of Tc-47m labeled red blood cells.  RADIOPHARMACEUTICALS:  25 mCi Tc-11m in-vitro labeled red cells.  COMPARISON:  04/19/2012 and CT examination 02/14/2014  FINDINGS: There is activity within the bowel at 20 minutes. This activity is the right abdomen and probably involves the right colon. During the second hour of imaging, the activity appears to move into the transverse colon.  IMPRESSION: Positive GI bleeding examination. Source of the bleeding appears to be coming from the right colon.   Electronically Signed   By: Markus Daft M.D.   On: 02/18/2014 15:12   Ct Abdomen Pelvis W Contrast  02/14/2014   CLINICAL DATA:  Ladies for several months without focal site of pain; no history of nausea, vomiting, diarrhea, or constipation; history of diverticulitis and also adenomatous colonic polyps ; the  EXAM: CT ABDOMEN AND PELVIS WITH CONTRAST  TECHNIQUE: Multidetector CT imaging of the abdomen and pelvis was performed using the standard protocol following bolus administration of intravenous contrast.  CONTRAST:  48mL OMNIPAQUE IOHEXOL 300 MG/ML SOLN intravenously ; the patient also received oral contrast material.  COMPARISON:  Abdominal pelvic CT scan of January 29, 2013.  FINDINGS: There is a moderate-sized hiatal hernia -partially intra thoracic stomach. There is no evidence of a small or large bowel obstruction. There is colonic diverticulosis centered in the sigmoid colon. There is no evidence of acute diverticulitis. There is  mild distention of the rectosigmoid with stool and gas which is a stable finding. The appendix is not discretely demonstrated. The terminal ileum is unremarkable.  There are calcified stones layering in the dependent portion of the gallbladder. No pericholecystic inflammatory change is demonstrated. The liver, spleen, pancreas, adrenal glands, and kidneys exhibit no acute abnormalities. There is cortical thinning of both kidneys. There are vascular calcifications involving branches of the left renal artery. There is a 1.2 cm diameter accessory spleen. The pancreas is largely fatty infiltrated. There are stable hypodensities within the cortex of both kidneys.  The abdominal aorta is normal in caliber and exhibits mural calcification. There is no inguinal hernia. The urinary bladder is partially distended and grossly normal. The uterus is apparently surgically absent but imaging of the pelvis is limited due to beam hardening artifact from the prosthetic right hip joint. There is a small stable fat containing umbilical hernia.  There is  stable 60% anterior wedge compression of the body of L1 with stable endplate osteophyte present at T12-L1. There is degenerative disc change at L3-4, L4-5, and L5-S1. The observed portions of the bony pelvis are unremarkable. There is extensive chronic soft tissue calcification lateral to the trochanteric region of the left hip.  IMPRESSION: 1. Gallstones without objective evidence of acute cholecystitis. Chronic cholecystitis could be present. No acute abnormality of the pancreas is noted. 2. There is colonic diverticulosis without evidence of acute diverticulitis. Certainly low-grade diverticulitis could be present without significant CT findings. 3. No acute urinary tract abnormality is demonstrated. 4. Chronic 60% anterior wedge compression of the body of L1.   Electronically Signed   By: David  Martinique   On: 02/14/2014 13:25    Labs:  CBC:  Recent Labs  02/14/14 0805  02/14/14 1911 02/15/14 0717 02/16/14 0216  02/17/14 0530 02/17/14 1600 02/18/14 0045 02/18/14 0535  WBC 8.5 7.9 7.0 9.7  --   --   --   --   --   HGB 10.2* 8.2* 7.6* 9.2*  < > 8.2* 10.3* 9.8* 10.4*  HCT 31.0* 24.6* 22.9* 26.4*  < > 23.7* 30.0* 28.7* 30.8*  PLT 278 226 212 187  --   --   --   --   --   < > = values in this interval not displayed.  COAGS: No results found for this basename: INR, APTT,  in the last 8760 hours  BMP:  Recent Labs  02/14/14 0805 02/15/14 0717 02/17/14 0530  NA 140 142 143  K 4.4 4.1 4.0  CL 105 109 110  CO2 25 23 23   GLUCOSE 102* 87 88  BUN 17 17 14   CALCIUM 9.1 8.3* 8.4  CREATININE 0.67 0.72 0.67  GFRNONAA 73* 71* 73*  GFRAA 85* 83* 85*    LIVER FUNCTION TESTS: No results found for this basename: BILITOT, AST, ALT, ALKPHOS, PROT, ALBUMIN,  in the last 8760 hours  TUMOR MARKERS: No results found for this basename: AFPTM, CEA, CA199, CHROMGRNA,  in the last 8760 hours  Assessment and Plan:  + NM GI bleed scan Now scheduled for mesenteric arteriogram with poss embolization Pt and son aware of procedure benefits and risks and agreeable to proceed Consent signed andin chart  Thank you for this interesting consult.  I greatly enjoyed meeting Sarah Perkins and look forward to participating in their care.    I spent a total of 40 minutes face to face in clinical consultation, greater than 50% of which was counseling/coordinating care for mesenteric arteriogram poss embo  Signed: Kesley Gaffey A 02/18/2014, 3:52 PM

## 2014-02-19 DIAGNOSIS — K922 Gastrointestinal hemorrhage, unspecified: Secondary | ICD-10-CM | POA: Diagnosis not present

## 2014-02-19 DIAGNOSIS — R58 Hemorrhage, not elsewhere classified: Secondary | ICD-10-CM | POA: Diagnosis not present

## 2014-02-19 DIAGNOSIS — I5032 Chronic diastolic (congestive) heart failure: Secondary | ICD-10-CM | POA: Diagnosis not present

## 2014-02-19 DIAGNOSIS — I120 Hypertensive chronic kidney disease with stage 5 chronic kidney disease or end stage renal disease: Secondary | ICD-10-CM | POA: Diagnosis not present

## 2014-02-19 DIAGNOSIS — J00 Acute nasopharyngitis [common cold]: Secondary | ICD-10-CM | POA: Diagnosis not present

## 2014-02-19 DIAGNOSIS — K5791 Diverticulosis of intestine, part unspecified, without perforation or abscess with bleeding: Secondary | ICD-10-CM | POA: Diagnosis not present

## 2014-02-19 DIAGNOSIS — M6281 Muscle weakness (generalized): Secondary | ICD-10-CM | POA: Diagnosis not present

## 2014-02-19 DIAGNOSIS — D649 Anemia, unspecified: Secondary | ICD-10-CM | POA: Diagnosis not present

## 2014-02-19 DIAGNOSIS — R278 Other lack of coordination: Secondary | ICD-10-CM | POA: Diagnosis not present

## 2014-02-19 DIAGNOSIS — F0390 Unspecified dementia without behavioral disturbance: Secondary | ICD-10-CM | POA: Diagnosis not present

## 2014-02-19 DIAGNOSIS — N181 Chronic kidney disease, stage 1: Secondary | ICD-10-CM | POA: Diagnosis not present

## 2014-02-19 DIAGNOSIS — I509 Heart failure, unspecified: Secondary | ICD-10-CM | POA: Diagnosis not present

## 2014-02-19 DIAGNOSIS — R2681 Unsteadiness on feet: Secondary | ICD-10-CM | POA: Diagnosis not present

## 2014-02-19 DIAGNOSIS — D62 Acute posthemorrhagic anemia: Secondary | ICD-10-CM | POA: Diagnosis not present

## 2014-02-19 DIAGNOSIS — I10 Essential (primary) hypertension: Secondary | ICD-10-CM | POA: Diagnosis not present

## 2014-02-19 DIAGNOSIS — K921 Melena: Secondary | ICD-10-CM | POA: Diagnosis not present

## 2014-02-19 DIAGNOSIS — K219 Gastro-esophageal reflux disease without esophagitis: Secondary | ICD-10-CM | POA: Diagnosis not present

## 2014-02-19 DIAGNOSIS — K5792 Diverticulitis of intestine, part unspecified, without perforation or abscess without bleeding: Secondary | ICD-10-CM | POA: Diagnosis not present

## 2014-02-19 LAB — HEMOGLOBIN AND HEMATOCRIT, BLOOD
HCT: 31.3 % — ABNORMAL LOW (ref 36.0–46.0)
Hemoglobin: 10.6 g/dL — ABNORMAL LOW (ref 12.0–15.0)

## 2014-02-19 MED ORDER — HYDRALAZINE HCL 20 MG/ML IJ SOLN: 10.0000 mg | Freq: Four times a day (QID) | INTRAMUSCULAR | Status: DC | PRN

## 2014-02-19 MED ORDER — AMLODIPINE BESYLATE 5 MG PO TABS
5.0000 mg | ORAL_TABLET | Freq: Every day | ORAL | Status: DC
  Administered 2014-02-19: 5 mg via ORAL
  Filled 2014-02-19: qty 1

## 2014-02-19 NOTE — Progress Notes (Signed)
Patient's b/p has been running in the 160's for a few days. NP Lynch notified to see if there is anything further that needs to be done. Awaiting any new orders.

## 2014-02-19 NOTE — Discharge Summary (Signed)
PATIENT DETAILS Name: Sarah Perkins Age: 80 y.o. Sex: female Date of Birth: 25-Nov-1917 MRN: [id number]. Admitting Physician: Annita Brod, MD WUG:[id number],UUEKC, MD  Admit Date: 02/14/2014 Discharge date: 02/19/2014  Recommendations for Outpatient Follow-up:  1. Recheck CBC in 1 week.  PRIMARY DISCHARGE DIAGNOSIS:  Principal Problem:   GI bleed Active Problems:   HTN (hypertension)   CKD (chronic kidney disease), stage I   GERD (gastroesophageal reflux disease)   Acute posthemorrhagic anemia   Dementia   GIB (gastrointestinal bleeding)   Abnormal EKG      PAST MEDICAL HISTORY: Past Medical History  Diagnosis Date  . CHF (congestive heart failure)   . Hypertension   . Aneurysm 1972    cerebral  . Acid reflux     occasional  . Tremor     worse on right arm  . History of GI diverticular bleed 06/2009    colonoscopy with endo clipping of bleeding tic by Dr Clarene Essex.  Flex sig by Dr Paulita Fujita.  . Acute blood loss anemia 06/2009    received ~ 6 units blood  . Adenomatous polyp of colon 06/2009  . Diverticulitis   . UTI (lower urinary tract infection)   . GI bleed 02/14/2014    DISCHARGE MEDICATIONS:   Medication List         amLODipine 5 MG tablet  Commonly known as:  NORVASC  Take 5 mg by mouth daily.     cholecalciferol 1000 UNITS tablet  Commonly known as:  VITAMIN D  Take 1,000 Units by mouth daily.     docusate sodium 100 MG capsule  Commonly known as:  COLACE  Take 100 mg by mouth at bedtime.     ferrous sulfate 325 (65 FE) MG tablet  Take 325 mg by mouth daily with breakfast.     furosemide 20 MG tablet  Commonly known as:  LASIX  Take 20 mg by mouth daily.     OVER THE COUNTER MEDICATION  Take 1 tablet by mouth daily. Calcium, Magnesium and Zinc     vitamin C 100 MG tablet  Take 100 mg by mouth daily. With rose hips 500 mg        ALLERGIES:  No Known Allergies  BRIEF HPI:  See H&P, Labs, Consult and Test reports for  all details in brief, patient is a 80 y.o. Female with past medical history of mild dementia, hypertension and previous episodes x2 his severe diverticular bleeding, the last one being 2 years ago which recurred endoscopic clipping and diverticulum. Patient was  found her in bed with bright red and maroon stool, and subsequently brought to the ED for further evaluation and treatment  CONSULTATIONS:   GI  PERTINENT RADIOLOGIC STUDIES: Nm Gi Blood Loss  02/18/2014   CLINICAL DATA:  80 year old with the acute GI bleeding. History of diverticular bleeding.  EXAM: NUCLEAR MEDICINE GASTROINTESTINAL BLEEDING SCAN  TECHNIQUE: Sequential abdominal images were obtained following intravenous administration of Tc-12m labeled red blood cells.  RADIOPHARMACEUTICALS:  25 mCi Tc-28m in-vitro labeled red cells.  COMPARISON:  04/19/2012 and CT examination 02/14/2014  FINDINGS: There is activity within the bowel at 20 minutes. This activity is the right abdomen and probably involves the right colon. During the second hour of imaging, the activity appears to move into the transverse colon.  IMPRESSION: Positive GI bleeding examination. Source of the bleeding appears to be coming from the right colon.   Electronically Signed   By: Scherrie Gerlach.D.  On: 02/18/2014 15:12   Ct Abdomen Pelvis W Contrast  02/14/2014   CLINICAL DATA:  Ladies for several months without focal site of pain; no history of nausea, vomiting, diarrhea, or constipation; history of diverticulitis and also adenomatous colonic polyps ; the  EXAM: CT ABDOMEN AND PELVIS WITH CONTRAST  TECHNIQUE: Multidetector CT imaging of the abdomen and pelvis was performed using the standard protocol following bolus administration of intravenous contrast.  CONTRAST:  33mL OMNIPAQUE IOHEXOL 300 MG/ML SOLN intravenously ; the patient also received oral contrast material.  COMPARISON:  Abdominal pelvic CT scan of January 29, 2013.  FINDINGS: There is a moderate-sized hiatal  hernia -partially intra thoracic stomach. There is no evidence of a small or large bowel obstruction. There is colonic diverticulosis centered in the sigmoid colon. There is no evidence of acute diverticulitis. There is mild distention of the rectosigmoid with stool and gas which is a stable finding. The appendix is not discretely demonstrated. The terminal ileum is unremarkable.  There are calcified stones layering in the dependent portion of the gallbladder. No pericholecystic inflammatory change is demonstrated. The liver, spleen, pancreas, adrenal glands, and kidneys exhibit no acute abnormalities. There is cortical thinning of both kidneys. There are vascular calcifications involving branches of the left renal artery. There is a 1.2 cm diameter accessory spleen. The pancreas is largely fatty infiltrated. There are stable hypodensities within the cortex of both kidneys.  The abdominal aorta is normal in caliber and exhibits mural calcification. There is no inguinal hernia. The urinary bladder is partially distended and grossly normal. The uterus is apparently surgically absent but imaging of the pelvis is limited due to beam hardening artifact from the prosthetic right hip joint. There is a small stable fat containing umbilical hernia.  There is stable 60% anterior wedge compression of the body of L1 with stable endplate osteophyte present at T12-L1. There is degenerative disc change at L3-4, L4-5, and L5-S1. The observed portions of the bony pelvis are unremarkable. There is extensive chronic soft tissue calcification lateral to the trochanteric region of the left hip.  IMPRESSION: 1. Gallstones without objective evidence of acute cholecystitis. Chronic cholecystitis could be present. No acute abnormality of the pancreas is noted. 2. There is colonic diverticulosis without evidence of acute diverticulitis. Certainly low-grade diverticulitis could be present without significant CT findings. 3. No acute urinary  tract abnormality is demonstrated. 4. Chronic 60% anterior wedge compression of the body of L1.   Electronically Signed   By: David  Martinique   On: 02/14/2014 13:25   Ir Angiogram Visceral Selective  02/18/2014   CLINICAL DATA:  Recurrent acute right lower GI bleeding requiring transfusion. Positive nuclear medicine bleeding scan in the right colon.  EXAM: Ultrasound guidance for vascular access  Superior mesenteric artery catheterization and angiogram  Date:  10/23/201510/23/2015 5:22 pm  Radiologist:  M. Daryll Brod, MD  Guidance:  Ultrasound and fluoroscopic  FLUOROSCOPY TIME:  5 min 30 seconds  MEDICATIONS AND MEDICAL HISTORY: 12.5 mcg fentanyl  ANESTHESIA/SEDATION: 42 min  CONTRAST:  42mL OMNIPAQUE IOHEXOL 300 MG/ML  SOLN  COMPLICATIONS: None immediate  PROCEDURE: Informed consent was obtained from the patient following explanation of the procedure, risks, benefits and alternatives. The patient understands, agrees and consents for the procedure. All questions were addressed. A time out was performed.  Maximal barrier sterile technique utilized including caps, mask, sterile gowns, sterile gloves, large sterile drape, hand hygiene, and Betadine.  Under sterile conditions and local anesthesia, micropuncture ultrasound access performed of  the right common femoral artery. Five French sheath inserted over a guidewire. Sos Omni select catheter reformed in the aorta and utilized to select the SMA. SMA angiogram performed.  SMA: SMA is widely patent. Main ileocolic trunk is patent. Jejunal and colic branches are patent throughout the mesentery. No evidence of contrast extravasation, active bleeding or vascular abnormality. On the venous phase, the mesenteric veins and portal vein are patent.  Cholelithiasis noted as before.  Guidewire and catheter removed. Sheath removed. Hemostasis obtained with manual compression for 15 min. Patient tolerated the procedure well. No immediate complication.  IMPRESSION: Negative SMA  angiogram for acute GI bleeding.   Electronically Signed   By: Daryll Brod M.D.   On: 02/18/2014 17:31   Ir US Guide Vasc Access Right  02/18/2014   CLINICAL DATA:  Recurrent acute right lower GI bleeding requiring transfusion. Positive nuclear medicine bleeding scan in the right colon.  EXAM: Ultrasound guidance for vascular access  Superior mesenteric artery catheterization and angiogram  Date:  10/23/201510/23/2015 5:22 pm  Radiologist:  M. Daryll Brod, MD  Guidance:  Ultrasound and fluoroscopic  FLUOROSCOPY TIME:  5 min 30 seconds  MEDICATIONS AND MEDICAL HISTORY: 12.5 mcg fentanyl  ANESTHESIA/SEDATION: 42 min  CONTRAST:  58mL OMNIPAQUE IOHEXOL 300 MG/ML  SOLN  COMPLICATIONS: None immediate  PROCEDURE: Informed consent was obtained from the patient following explanation of the procedure, risks, benefits and alternatives. The patient understands, agrees and consents for the procedure. All questions were addressed. A time out was performed.  Maximal barrier sterile technique utilized including caps, mask, sterile gowns, sterile gloves, large sterile drape, hand hygiene, and Betadine.  Under sterile conditions and local anesthesia, micropuncture ultrasound access performed of the right common femoral artery. Five French sheath inserted over a guidewire. Sos Omni select catheter reformed in the aorta and utilized to select the SMA. SMA angiogram performed.  SMA: SMA is widely patent. Main ileocolic trunk is patent. Jejunal and colic branches are patent throughout the mesentery. No evidence of contrast extravasation, active bleeding or vascular abnormality. On the venous phase, the mesenteric veins and portal vein are patent.  Cholelithiasis noted as before.  Guidewire and catheter removed. Sheath removed. Hemostasis obtained with manual compression for 15 min. Patient tolerated the procedure well. No immediate complication.  IMPRESSION: Negative SMA angiogram for acute GI bleeding.   Electronically Signed    By: Daryll Brod M.D.   On: 02/18/2014 17:31     PERTINENT LAB RESULTS: CBC:  Recent Labs  02/18/14 1541 02/19/14 0441  HGB 11.3* 10.6*  HCT 32.9* 31.3*   CMET CMP     Component Value Date/Time   NA 143 02/17/2014 0530   K 4.0 02/17/2014 0530   CL 110 02/17/2014 0530   CO2 23 02/17/2014 0530   GLUCOSE 88 02/17/2014 0530   BUN 14 02/17/2014 0530   CREATININE 0.67 02/17/2014 0530   CREATININE 0.73 01/26/2009 1310   CALCIUM 8.4 02/17/2014 0530   PROT 7.5 01/30/2013 0845   ALBUMIN 2.2* 01/30/2013 0845   AST 36 01/30/2013 0845   ALT 52* 01/30/2013 0845   ALKPHOS 91 01/30/2013 0845   BILITOT 2.3* 01/30/2013 0845   GFRNONAA 73* 02/17/2014 0530   GFRAA 85* 02/17/2014 0530    GFR Estimated Creatinine Clearance: 41.8 ml/min (by C-G formula based on Cr of 0.67). No results found for this basename: LIPASE, AMYLASE,  in the last 72 hours No results found for this basename: CKTOTAL, CKMB, CKMBINDEX, TROPONINI,  in the last 72  hours No components found with this basename: POCBNP,  No results found for this basename: DDIMER,  in the last 72 hours No results found for this basename: HGBA1C,  in the last 72 hours No results found for this basename: CHOL, HDL, LDLCALC, TRIG, CHOLHDL, LDLDIRECT,  in the last 72 hours No results found for this basename: TSH, T4TOTAL, FREET3, T3FREE, THYROIDAB,  in the last 72 hours No results found for this basename: VITAMINB12, FOLATE, FERRITIN, TIBC, IRON, RETICCTPCT,  in the last 72 hours Coags: No results found for this basename: PT, INR,  in the last 72 hours Microbiology: Recent Results (from the past 240 hour(s))  URINE CULTURE     Status: None   Collection Time    02/14/14  9:03 AM      Result Value Ref Range Status   Specimen Description URINE, CATHETERIZED   Final   Special Requests ADDED [id number]   Final   Culture  Setup Time     Final   Value: 02/14/2014 17:30     Performed at [facility]     Final   Value:  >=100,000 COLONIES/ML     Performed at [facility]   Culture     Final   Value: KLEBSIELLA PNEUMONIAE     Performed at [facility]   Report Status 02/16/2014 FINAL   Final   Organism ID, Bacteria KLEBSIELLA PNEUMONIAE   Final  MRSA PCR SCREENING     Status: Abnormal   Collection Time    02/14/14  3:04 PM      Result Value Ref Range Status   MRSA by PCR POSITIVE (*) NEGATIVE Final   Comment:            The GeneXpert MRSA Assay (FDA     approved for NASAL specimens     only), is one component of a     comprehensive MRSA colonization     surveillance program. It is not     intended to diagnose MRSA     infection nor to guide or     monitor treatment for     MRSA infections.     RESULT CALLED TO, READ BACK BY AND VERIFIED WITH:     Etheleen Mayhew RN 16:40 02/14/14 ([email or web address])     BRIEF HOSPITAL COURSE:   Principal Problem: Lower GI Bleed: Suspected diverticulosis cause. Has not bled since 10/22 and is hemodynamically stable. Tagged RBC study was positive fo positive GI bleeding in the right colon, this was followed by a mesentric angiogram, which was negative for bleeding. Since bleeding has resolved for 48 hours, Hb stable, stable for discharge to SNF today. Discussed with Dr Watt Climes, no further recommendations by GI.  Acute Blood Loss Anemia: From lower GI bleed. Has received 3 units of PRBC total. HGB stable at 10.6. Please check CBC in 1 week. UTI: Culture grew Klebsiella Pneumoniae. Sensitive to Rocephin. Completed 3 days of IV rocephin on 10/22. Afebrile  Chronic Diastolic CHF: Compensated, Lasix to be resumed on discharge  Hypertension: Moderate control, BP still fluctuates. Due to recent GI bleed  antihypertensives held, will be resumed on discharge. Dementia: Mild delirium. Not currently on any medications.   TODAY-DAY OF DISCHARGE:  Subjective:   Sarah Perkins today has no further hematochezia.  Objective:   Blood pressure 160/74, pulse 80, temperature  97.7 F (36.5 C), temperature source Oral, resp. rate 18, height 5\' 7"  (1.702 m), weight 63.095 kg (139 lb  1.6 oz), SpO2 94.00%.  Intake/Output Summary (Last 24 hours) at 02/19/14 1047 Last data filed at 02/19/14 0945  Gross per 24 hour  Intake    120 ml  Output      0 ml  Net    120 ml   Filed Weights   02/14/14 1500  Weight: 63.095 kg (139 lb 1.6 oz)    Exam Awake Alert, Oriented *3, No new F.N deficits, Normal affect [place].AT,PERRAL Supple Neck,No JVD, No cervical lymphadenopathy appriciated.  Symmetrical Chest wall movement, Good air movement bilaterally, CTAB RRR,No Gallops,Rubs or new Murmurs, No Parasternal Heave +ve B.Sounds, Abd Soft, Non tender, No organomegaly appriciated, No rebound -guarding or rigidity. No Cyanosis, Clubbing or edema, No new Rash or bruise  DISCHARGE CONDITION: Stable  DISPOSITION: SNF  DISCHARGE INSTRUCTIONS:    Activity:  As tolerated with Full fall precautions use walker/cane & assistance as needed  Diet recommendation: Heart Healthy diet      Discharge Instructions   Call MD for:    Complete by:  As directed   Hematochezia     Diet - low sodium heart healthy    Complete by:  As directed      Increase activity slowly    Complete by:  As directed            Follow-up Information   Follow up with Thressa Sheller, MD. Schedule an appointment as soon as possible for a visit in 1 week.   Specialty:  Internal Medicine   Contact information:   [address], [place] [address] [phone]       Please follow up.   Contact information:   [facility] as needed      Total Time spent on discharge equals 45 minutes.  SignedOren Binet 02/19/2014 10:47 AM

## 2014-02-19 NOTE — Clinical Social Work Placement (Signed)
Clinical Social Work Department CLINICAL SOCIAL WORK PLACEMENT NOTE 02/19/2014  Patient:  Sarah Perkins, Sarah Perkins  Account Number:  [id number] Admit date:  02/14/2014  Clinical Social Worker:  Lovey Newcomer  Date/time:  02/16/2014 04:30 PM  Clinical Social Work is seeking post-discharge placement for this patient at the following level of care:   SKILLED NURSING   (*CSW will update this form in Epic as items are completed)   02/16/2014  Patient/family provided with [place] Department of Clinical Social Work's list of facilities offering this level of care within the geographic area requested by the patient (or if unable, by the patient's family).  02/16/2014  Patient/family informed of their freedom to choose among providers that offer the needed level of care, that participate in Medicare, Medicaid or managed care program needed by the patient, have an available bed and are willing to accept the patient.  02/16/2014  Patient/family informed of [facility]' ownership interest in [facility], as well as of the fact that they are under no obligation to receive care at this facility.  PASARR submitted to EDS on  PASARR number received on   FL2 transmitted to all facilities in geographic area requested by pt/family on  02/16/2014 FL2 transmitted to all facilities within larger geographic area on   Patient informed that his/her managed care company has contracts with or will negotiate with  certain facilities, including the following:     Patient/family informed of bed offers received:  02/18/2014 Patient chooses bed at [facility] Physician recommends and patient chooses bed at    Patient to be transferred to [facility] on  02/19/2014 Patient to be transferred to facility by Ambulance Patient and family notified of transfer on 02/19/2014 Name of family member notified:  [name] (son)  The following physician request were entered in  Epic:   Additional Comments:    Per MD patient ready for DC to [facility]. RN, patient, patient's family, and facility notified of DC. RN given number for report. DC packet on chart. [facility] transport requested for patient. CSW signing off.    Liz Beach MSW, [place], [place], [phone]

## 2014-02-19 NOTE — Progress Notes (Signed)
Sarah Perkins 11:14 AM  Subjective: Patient without any new complaints except for her eyes and her case was discussed with the hospital team my partner Dr. Amedeo Plenty and her son and she has no signs of bleeding  Objective: Vital signs stable afebrile no acute distress hemoglobin fairly stable abdomen is soft nontender  Assessment: Probable diverticular bleeding currently stable  Plan: Please let us know if we could help any further with this hospital stay and his son and I discussed that she's not on any aspirin nonsteroidals or blood thinners and it's hard to predict when or if she would rebleed and he is not interested in colonoscopy at this time  [facility]

## 2014-02-19 NOTE — Progress Notes (Signed)
Patient right groin dressing is intact with no bleeding. Pulses are palpable. Patient can move leg when asked. Color is consistent with rest of skin. Will continue to monitor

## 2014-02-21 ENCOUNTER — Encounter: Payer: Self-pay | Admitting: Adult Health

## 2014-02-21 ENCOUNTER — Non-Acute Institutional Stay (SKILLED_NURSING_FACILITY): Payer: Medicare Other | Admitting: Adult Health

## 2014-02-21 DIAGNOSIS — I10 Essential (primary) hypertension: Secondary | ICD-10-CM | POA: Diagnosis not present

## 2014-02-21 DIAGNOSIS — F0390 Unspecified dementia without behavioral disturbance: Secondary | ICD-10-CM

## 2014-02-21 DIAGNOSIS — D62 Acute posthemorrhagic anemia: Secondary | ICD-10-CM | POA: Diagnosis not present

## 2014-02-21 DIAGNOSIS — K5791 Diverticulosis of intestine, part unspecified, without perforation or abscess with bleeding: Secondary | ICD-10-CM | POA: Diagnosis not present

## 2014-02-21 DIAGNOSIS — I5032 Chronic diastolic (congestive) heart failure: Secondary | ICD-10-CM | POA: Diagnosis not present

## 2014-02-22 ENCOUNTER — Non-Acute Institutional Stay (SKILLED_NURSING_FACILITY): Payer: Medicare Other | Admitting: Internal Medicine

## 2014-02-22 ENCOUNTER — Encounter: Payer: Self-pay | Admitting: Adult Health

## 2014-02-22 DIAGNOSIS — I5032 Chronic diastolic (congestive) heart failure: Secondary | ICD-10-CM

## 2014-02-22 DIAGNOSIS — D62 Acute posthemorrhagic anemia: Secondary | ICD-10-CM | POA: Diagnosis not present

## 2014-02-22 DIAGNOSIS — I10 Essential (primary) hypertension: Secondary | ICD-10-CM | POA: Diagnosis not present

## 2014-02-22 DIAGNOSIS — F0390 Unspecified dementia without behavioral disturbance: Secondary | ICD-10-CM

## 2014-02-22 NOTE — Progress Notes (Signed)
Patient ID: Sarah Perkins, female   DOB: 1917-06-14, 80 y.o.   MRN: [id number]   02/21/14  Facility:  [facility] Home Location:  [place] Room Number: [id number] LEVEL OF CARE:  SNF (31)    Chief Complaint  Patient presents with  . New Admit To SNF    HISTORY OF PRESENT ILLNESS:  This is a 80 year old female who has been admitted to [facility] on 02/19/14 from [facility] with primary discharge diagnosis of GI Bleed. She has been admitted for a short-term rehabilitation.  REASSESSMENT OF ONGOING PROBLEMS:  HTN: Pt 's HTN remains stable.  Denies CP, sob, DOE, pedal edema, headaches, dizziness or visual disturbances.  No complications from the medications currently being used.  Last BP : 138/74  ANEMIA: The anemia has been stable. The patient denies fatigue, melena or hematochezia. No complications from the medications currently being used. 10/15 hgb 10.6  CHF:The patient does not relate significant weight changes, denies sob, DOE, orthopnea, PNDs, pedal edema, palpitations or chest pain.  CHF remains stable.  No complications form the medications being used.  PAST MEDICAL HISTORY:  Past Medical History  Diagnosis Date  . CHF (congestive heart failure)   . Hypertension   . Aneurysm 1972    cerebral  . Acid reflux     occasional  . Tremor     worse on right arm  . History of GI diverticular bleed 06/2009    colonoscopy with endo clipping of bleeding tic by Dr Clarene Essex.  Flex sig by Dr Paulita Fujita.  . Acute blood loss anemia 06/2009    received ~ 6 units blood  . Adenomatous polyp of colon 06/2009  . Diverticulitis   . UTI (lower urinary tract infection)   . GI bleed 02/14/2014    CURRENT MEDICATIONS: Reviewed per MAR/see medication list  No Known Allergies   REVIEW OF SYSTEMS:  GENERAL: no change in appetite, no fatigue, no weight changes, no fever, chills or weakness RESPIRATORY: no cough, SOB, DOE, wheezing,  hemoptysis CARDIAC: no chest pain, edema or palpitations GI: no abdominal pain, diarrhea, constipation, heart burn, nausea or vomiting  PHYSICAL EXAMINATION  GENERAL: no acute distress, normal body habitus EYES: conjunctivae normal, sclerae normal, normal eye lids NECK: supple, trachea midline, no neck masses, no thyroid tenderness, no thyromegaly LYMPHATICS: no LAN in the neck, no supraclavicular LAN RESPIRATORY: breathing is even & unlabored, BS CTAB CARDIAC: RRR, no murmur,no extra heart sounds, no edema GI: abdomen soft, normal BS, no masses, no tenderness, no hepatomegaly, no splenomegaly EXTREMITIES:  Able to move all 4 extremities PSYCHIATRIC: the patient is alert & oriented to person, affect & behavior appropriate  LABS/RADIOLOGY: Labs reviewed: Basic Metabolic Panel:  Recent Labs  02/14/14 0805 02/15/14 0717 02/17/14 0530  NA 140 142 143  K 4.4 4.1 4.0  CL 105 109 110  CO2 25 23 23   GLUCOSE 102* 87 88  BUN 17 17 14   CREATININE 0.67 0.72 0.67  CALCIUM 9.1 8.3* 8.4   CBC:  Recent Labs  02/14/14 0805 02/14/14 1911 02/15/14 0717 02/16/14 0216  02/18/14 0535 02/18/14 1541 02/19/14 0441  WBC 8.5 7.9 7.0 9.7  --   --   --   --   NEUTROABS 4.8  --  4.0  --   --   --   --   --   HGB 10.2* 8.2* 7.6* 9.2*  < > 10.4* 11.3* 10.6*  HCT 31.0* 24.6* 22.9* 26.4*  < >  30.8* 32.9* 31.3*  MCV 94.5 93.9 94.6 91.0  --   --   --   --   PLT 278 226 212 187  --   --   --   --   < > = values in this interval not displayed.  Nm Gi Blood Loss  02/18/2014   CLINICAL DATA:  80 year old with the acute GI bleeding. History of diverticular bleeding.  EXAM: NUCLEAR MEDICINE GASTROINTESTINAL BLEEDING SCAN  TECHNIQUE: Sequential abdominal images were obtained following intravenous administration of Tc-56m labeled red blood cells.  RADIOPHARMACEUTICALS:  25 mCi Tc-43m in-vitro labeled red cells.  COMPARISON:  04/19/2012 and CT examination 02/14/2014  FINDINGS: There is activity within the  bowel at 20 minutes. This activity is the right abdomen and probably involves the right colon. During the second hour of imaging, the activity appears to move into the transverse colon.  IMPRESSION: Positive GI bleeding examination. Source of the bleeding appears to be coming from the right colon.   Electronically Signed   By: Markus Daft M.D.   On: 02/18/2014 15:12   Ct Abdomen Pelvis W Contrast  02/14/2014   CLINICAL DATA:  Ladies for several months without focal site of pain; no history of nausea, vomiting, diarrhea, or constipation; history of diverticulitis and also adenomatous colonic polyps ; the  EXAM: CT ABDOMEN AND PELVIS WITH CONTRAST  TECHNIQUE: Multidetector CT imaging of the abdomen and pelvis was performed using the standard protocol following bolus administration of intravenous contrast.  CONTRAST:  79mL OMNIPAQUE IOHEXOL 300 MG/ML SOLN intravenously ; the patient also received oral contrast material.  COMPARISON:  Abdominal pelvic CT scan of January 29, 2013.  FINDINGS: There is a moderate-sized hiatal hernia -partially intra thoracic stomach. There is no evidence of a small or large bowel obstruction. There is colonic diverticulosis centered in the sigmoid colon. There is no evidence of acute diverticulitis. There is mild distention of the rectosigmoid with stool and gas which is a stable finding. The appendix is not discretely demonstrated. The terminal ileum is unremarkable.  There are calcified stones layering in the dependent portion of the gallbladder. No pericholecystic inflammatory change is demonstrated. The liver, spleen, pancreas, adrenal glands, and kidneys exhibit no acute abnormalities. There is cortical thinning of both kidneys. There are vascular calcifications involving branches of the left renal artery. There is a 1.2 cm diameter accessory spleen. The pancreas is largely fatty infiltrated. There are stable hypodensities within the cortex of both kidneys.  The abdominal aorta is  normal in caliber and exhibits mural calcification. There is no inguinal hernia. The urinary bladder is partially distended and grossly normal. The uterus is apparently surgically absent but imaging of the pelvis is limited due to beam hardening artifact from the prosthetic right hip joint. There is a small stable fat containing umbilical hernia.  There is stable 60% anterior wedge compression of the body of L1 with stable endplate osteophyte present at T12-L1. There is degenerative disc change at L3-4, L4-5, and L5-S1. The observed portions of the bony pelvis are unremarkable. There is extensive chronic soft tissue calcification lateral to the trochanteric region of the left hip.  IMPRESSION: 1. Gallstones without objective evidence of acute cholecystitis. Chronic cholecystitis could be present. No acute abnormality of the pancreas is noted. 2. There is colonic diverticulosis without evidence of acute diverticulitis. Certainly low-grade diverticulitis could be present without significant CT findings. 3. No acute urinary tract abnormality is demonstrated. 4. Chronic 60% anterior wedge compression of the body of  L1.   Electronically Signed   By: David  Martinique   On: 02/14/2014 13:25   Ir Angiogram Visceral Selective  02/18/2014   CLINICAL DATA:  Recurrent acute right lower GI bleeding requiring transfusion. Positive nuclear medicine bleeding scan in the right colon.  EXAM: Ultrasound guidance for vascular access  Superior mesenteric artery catheterization and angiogram  Date:  10/23/201510/23/2015 5:22 pm  Radiologist:  M. Daryll Brod, MD  Guidance:  Ultrasound and fluoroscopic  FLUOROSCOPY TIME:  5 min 30 seconds  MEDICATIONS AND MEDICAL HISTORY: 12.5 mcg fentanyl  ANESTHESIA/SEDATION: 42 min  CONTRAST:  28mL OMNIPAQUE IOHEXOL 300 MG/ML  SOLN  COMPLICATIONS: None immediate  PROCEDURE: Informed consent was obtained from the patient following explanation of the procedure, risks, benefits and alternatives. The  patient understands, agrees and consents for the procedure. All questions were addressed. A time out was performed.  Maximal barrier sterile technique utilized including caps, mask, sterile gowns, sterile gloves, large sterile drape, hand hygiene, and Betadine.  Under sterile conditions and local anesthesia, micropuncture ultrasound access performed of the right common femoral artery. Five French sheath inserted over a guidewire. Sos Omni select catheter reformed in the aorta and utilized to select the SMA. SMA angiogram performed.  SMA: SMA is widely patent. Main ileocolic trunk is patent. Jejunal and colic branches are patent throughout the mesentery. No evidence of contrast extravasation, active bleeding or vascular abnormality. On the venous phase, the mesenteric veins and portal vein are patent.  Cholelithiasis noted as before.  Guidewire and catheter removed. Sheath removed. Hemostasis obtained with manual compression for 15 min. Patient tolerated the procedure well. No immediate complication.  IMPRESSION: Negative SMA angiogram for acute GI bleeding.   Electronically Signed   By: Daryll Brod M.D.   On: 02/18/2014 17:31   Ir US Guide Vasc Access Right  02/18/2014   CLINICAL DATA:  Recurrent acute right lower GI bleeding requiring transfusion. Positive nuclear medicine bleeding scan in the right colon.  EXAM: Ultrasound guidance for vascular access  Superior mesenteric artery catheterization and angiogram  Date:  10/23/201510/23/2015 5:22 pm  Radiologist:  M. Daryll Brod, MD  Guidance:  Ultrasound and fluoroscopic  FLUOROSCOPY TIME:  5 min 30 seconds  MEDICATIONS AND MEDICAL HISTORY: 12.5 mcg fentanyl  ANESTHESIA/SEDATION: 42 min  CONTRAST:  58mL OMNIPAQUE IOHEXOL 300 MG/ML  SOLN  COMPLICATIONS: None immediate  PROCEDURE: Informed consent was obtained from the patient following explanation of the procedure, risks, benefits and alternatives. The patient understands, agrees and consents for the procedure.  All questions were addressed. A time out was performed.  Maximal barrier sterile technique utilized including caps, mask, sterile gowns, sterile gloves, large sterile drape, hand hygiene, and Betadine.  Under sterile conditions and local anesthesia, micropuncture ultrasound access performed of the right common femoral artery. Five French sheath inserted over a guidewire. Sos Omni select catheter reformed in the aorta and utilized to select the SMA. SMA angiogram performed.  SMA: SMA is widely patent. Main ileocolic trunk is patent. Jejunal and colic branches are patent throughout the mesentery. No evidence of contrast extravasation, active bleeding or vascular abnormality. On the venous phase, the mesenteric veins and portal vein are patent.  Cholelithiasis noted as before.  Guidewire and catheter removed. Sheath removed. Hemostasis obtained with manual compression for 15 min. Patient tolerated the procedure well. No immediate complication.  IMPRESSION: Negative SMA angiogram for acute GI bleeding.   Electronically Signed   By: Daryll Brod M.D.   On: 02/18/2014 17:31  ASSESSMENT/PLAN:  GI bleed - stable Anemia, acute posthemorrhagic - S/P 3 units PRBC; for repeat CBC in 1 week Hypertension - well controlled; continue amlodipine Chronic diastolic CHF - stable; continue Lasix Dementia - not on any medication  CPT CODE: 33744    [facility], [place]

## 2014-02-25 NOTE — Progress Notes (Signed)
HISTORY & PHYSICAL  DATE: 02/22/2014   FACILITY: [facility]  LEVEL OF CARE: SNF (31)  ALLERGIES:  No Known Allergies  CHIEF COMPLAINT:  Manage acute blood loss anemia, CHF & dementia  HISTORY OF PRESENT ILLNESS: 80 y/o Caucasian female was hospitalized secondary to a lower GI bleed.  After hospitalization she is admitted to this facility for short term rehabilitation.  ANEMIA: The anemia has been stable. The patient denies fatigue, melena or hematochezia. No complications from the medications currently being used.  ABLA is secondary to GI bleed.  She received 3U of PRBCs.  Hb 10.6.  CHF:The patient does not relate significant weight changes, denies sob, DOE, orthopnea, PNDs, pedal edema, palpitations or chest pain.  CHF remains stable.  No complications form the medications being used.  DEMENTIA: The dementia remaines stable and continues to function adequately in the current living environment with supervision.  The patient has had little changes in behavior. No complications noted from the medications presently being used.  PAST MEDICAL HISTORY :  Past Medical History  Diagnosis Date  . CHF (congestive heart failure)   . Hypertension   . Aneurysm 1972    cerebral  . Acid reflux     occasional  . Tremor     worse on right arm  . History of GI diverticular bleed 06/2009    colonoscopy with endo clipping of bleeding tic by Dr Clarene Essex.  Flex sig by Dr Paulita Fujita.  . Acute blood loss anemia 06/2009    received ~ 6 units blood  . Adenomatous polyp of colon 06/2009  . Diverticulitis   . UTI (lower urinary tract infection)   . GI bleed 02/14/2014    PAST SURGICAL HISTORY: Past Surgical History  Procedure Laterality Date  . Hip fracture surgery  2011  . Colonoscopy w/ control of hemorrhage  06/2009  . Knee arthroscopy    . Cerebral aneurysm repair  1970's  . Colonoscopy  09/22/2011    Procedure: COLONOSCOPY;  Surgeon: Juanita Craver, MD;  Location:  [facility] ENDOSCOPY;  Service: Endoscopy;  Laterality: N/A;  wants ped scope    SOCIAL HISTORY:  reports that she has never smoked. She has never used smokeless tobacco. She reports that she does not drink alcohol or use illicit drugs.  FAMILY HISTORY: None  CURRENT MEDICATIONS: Reviewed per MAR/see medication list  REVIEW OF SYSTEMS:  See HPI otherwise 14 point ROS is negative.  PHYSICAL EXAMINATION  VS:  See VS section  GENERAL: no acute distress, normal body habitus EYES: conjunctivae normal, sclerae normal, normal eye lids MOUTH/THROAT: lips without lesions,no lesions in the mouth,tongue is without lesions,uvula elevates in midline NECK: supple, trachea midline, no neck masses, no thyroid tenderness, no thyromegaly LYMPHATICS: no LAN in the neck, no supraclavicular LAN RESPIRATORY: breathing is even & unlabored, BS CTAB CARDIAC: RRR, no murmur,no extra heart sounds, +1 BLE edema GI:  ABDOMEN: abdomen soft, normal BS, no masses, no tenderness  LIVER/SPLEEN: no hepatomegaly, no splenomegaly MUSCULOSKELETAL: HEAD: normal to inspection  EXTREMITIES: LEFT UPPER EXTREMITY: moderate range of motion, normal strength & tone RIGHT UPPER EXTREMITY: moderate range of motion, normal strength & tone LEFT LOWER EXTREMITY: minimal range of motion, normal strength & tone RIGHT LOWER EXTREMITY: minimal range of motion, normal strength & tone PSYCHIATRIC: the patient is alert & oriented to person, affect & behavior appropriate  LABS/RADIOLOGY:  Labs reviewed: Basic Metabolic Panel:  Recent Labs  02/14/14 0805 02/15/14 0717  02/17/14 0530  NA 140 142 143  K 4.4 4.1 4.0  CL 105 109 110  CO2 25 23 23   GLUCOSE 102* 87 88  BUN 17 17 14   CREATININE 0.67 0.72 0.67  CALCIUM 9.1 8.3* 8.4   CBC:  Recent Labs  02/14/14 0805 02/14/14 1911 02/15/14 0717 02/16/14 0216  02/18/14 0535 02/18/14 1541 02/19/14 0441  WBC 8.5 7.9 7.0 9.7  --   --   --   --   NEUTROABS 4.8  --  4.0  --   --   --    --   --   HGB 10.2* 8.2* 7.6* 9.2*  < > 10.4* 11.3* 10.6*  HCT 31.0* 24.6* 22.9* 26.4*  < > 30.8* 32.9* 31.3*  MCV 94.5 93.9 94.6 91.0  --   --   --   --   PLT 278 226 212 187  --   --   --   --   < > = values in this interval not displayed.  Transthoracic Echocardiography  Patient:    [name], Stangeland MR #:       [id number] Study Date: 05/16/2011 Gender:     F Age:        16 Height:     170.2cm Weight:     76.7kg BSA:        1.68m^2 Pt. Status: Room:       [place], MD [place]  SONOGRAPHER  Kwong(Billy) Boyd Kerbs, RDCS cc:  ------------------------------------------------------------ LV EF: 60% -   65%  ------------------------------------------------------------ Indications:      Shortness of breath 786.05.  ------------------------------------------------------------ History:   PMH:   Congestive heart failure.  Risk factors: Hypertension.  ------------------------------------------------------------ Study Conclusions  - Left ventricle: The cavity size was normal. Wall thickness   was increased in a pattern of mild LVH. Systolic function   was normal. The estimated ejection fraction was in the   range of 60% to 65%. Wall motion was normal; there were no   regional wall motion abnormalities. - Mitral valve: Moderately calcified annulus. - Right atrium: The atrium was mildly dilated. - Pulmonary arteries: Systolic pressure was mildly to   moderately increased. PA peak pressure: 91mm Hg (S). Transthoracic echocardiography.  M-mode, complete 2D, spectral Doppler, and color Doppler.  Height:  Height: 170.2cm. Height: 67in.  Weight:  Weight: 76.7kg. Weight: 168.6lb.  Body mass index:  BMI: 26.5kg/m^2.  Body surface area:    BSA: 1.81m^2.  Blood pressure:     122/47.  Patient status:  Inpatient.  Location:   Bedside.  ------------------------------------------------------------  ------------------------------------------------------------ Left ventricle:  The cavity size was normal. Wall thickness was increased in a pattern of mild LVH. Systolic function was normal. The estimated ejection fraction was in the range of 60% to 65%. Wall motion was normal; there were no regional wall motion abnormalities.  ------------------------------------------------------------ Aortic valve:   Trileaflet; normal thickness leaflets. Mobility was not restricted.  Doppler:  Transvalvular velocity was within the normal range. There was no stenosis.  No regurgitation.  ------------------------------------------------------------ Aorta:  Aortic root: The aortic root was normal in size.  ------------------------------------------------------------ Mitral valve:   Moderately calcified annulus. Mobility was not restricted.  Doppler:  Transvalvular velocity was within the normal range. There was no evidence for stenosis.  No  regurgitation.    Peak gradient: 1mm Hg (D).  ------------------------------------------------------------ Left atrium:  The atrium was normal in size.  ------------------------------------------------------------ Right ventricle:  The cavity size was normal. Wall thickness was normal. Systolic function was normal.  ------------------------------------------------------------ Pulmonic valve:   Not well visualized.  ------------------------------------------------------------ Tricuspid valve:   Structurally normal valve.    Doppler: Transvalvular velocity was within the normal range.  Mild regurgitation.  ------------------------------------------------------------ Pulmonary artery:   The main pulmonary artery was normal-sized. Systolic pressure was mildly to moderately increased.  ------------------------------------------------------------ Right atrium:  The atrium was mildly  dilated.  ------------------------------------------------------------ Pericardium:  There was no pericardial effusion.  ------------------------------------------------------------ Systemic veins: Inferior vena cava: The vessel was dilated; respirophasic changes in dimension were absent.  ------------------------------------------------------------  2D measurements        Normal  Doppler               Normal Left ventricle                 measurements LVID ED,   39.8 mm     43-52   Main pulmonary chord,                         artery PLAX                           Pressure, S    44 mm  =30 LVID ES,   20.7 mm     23-38                     Hg chord,                         Left ventricle PLAX                           Ea, lat       7.9 cm/ ------- FS, chord,   48 %      >29     ann, tiss         s PLAX                           DP LVPW, ED     10 mm     ------  E/Ea, lat   11.37     ------- IVS/LVPW   1.24        <1.3    ann, tiss ratio, ED                      DP Ventricular septum             Ea, med      8.88 cm/ ------- IVS, ED    12.4 mm     ------  ann, tiss         s Aorta                          DP Root diam,   30 mm     ------  E/Ea, med   10.11     ------- ED                             ann, tiss Left atrium  DP AP dim       36 mm     ------  Mitral valve AP dim     1.91 cm/m^2 <2.2    Peak E vel   89.8 cm/ ------- index                                            s Vol, S       48 ml     ------  Peak A vel   98.7 cm/ ------- Vol index, 25.5 ml/m^2 ------                    s S                              Deceleratio   257 ms  150-230                                n time                                Peak            3 mm  -------                                gradient, D       Hg                                Peak E/A      0.9     -------                                ratio                                Tricuspid valve                                 Regurg peak   271 cm/ -------                                vel               s                                Peak RV-RA     29 mm  -------                                gradient, S       Hg                                Systemic veins  Estimated      15 mm  -------                                CVP               Hg                                Right ventricle                                Pressure, S    44 mm  <30                                                  Hg                                Sa vel, lat  14.6 cm/ -------                                ann, tiss         s                                DP   CT ABDOMEN AND PELVIS WITH CONTRAST   TECHNIQUE: Multidetector CT imaging of the abdomen and pelvis was performed using the standard protocol following bolus administration of intravenous contrast.   CONTRAST:  27mL OMNIPAQUE IOHEXOL 300 MG/ML SOLN intravenously ; the patient also received oral contrast material.   COMPARISON:  Abdominal pelvic CT scan of January 29, 2013.   FINDINGS: There is a moderate-sized hiatal hernia -partially intra thoracic stomach. There is no evidence of a small or large bowel obstruction. There is colonic diverticulosis centered in the sigmoid colon. There is no evidence of acute diverticulitis. There is mild distention of the rectosigmoid with stool and gas which is a stable finding. The appendix is not discretely demonstrated. The terminal ileum is unremarkable.   There are calcified stones layering in the dependent portion of the gallbladder. No pericholecystic inflammatory change is demonstrated. The liver, spleen, pancreas, adrenal glands, and kidneys exhibit no acute abnormalities. There is cortical thinning of both kidneys. There are vascular calcifications involving branches of the left renal artery. There is a 1.2 cm diameter accessory spleen. The pancreas is largely fatty infiltrated.  There are stable hypodensities within the cortex of both kidneys.   The abdominal aorta is normal in caliber and exhibits mural calcification. There is no inguinal hernia. The urinary bladder is partially distended and grossly normal. The uterus is apparently surgically absent but imaging of the pelvis is limited due to beam hardening artifact from the prosthetic right hip joint. There is a small stable fat containing umbilical hernia.   There is stable 60% anterior wedge compression of the body of L1 with stable endplate osteophyte present at T12-L1. There is degenerative disc change at L3-4, L4-5, and L5-S1. The observed portions of the bony pelvis are unremarkable. There is extensive chronic  soft tissue calcification lateral to the trochanteric region of the left hip.   IMPRESSION: 1. Gallstones without objective evidence of acute cholecystitis. Chronic cholecystitis could be present. No acute abnormality of the pancreas is noted. 2. There is colonic diverticulosis without evidence of acute diverticulitis. Certainly low-grade diverticulitis could be present without significant CT findings. 3. No acute urinary tract abnormality is demonstrated. 4. Chronic 60% anterior wedge compression of the body of L1.     NUCLEAR MEDICINE GASTROINTESTINAL BLEEDING SCAN   TECHNIQUE: Sequential abdominal images were obtained following intravenous administration of Tc-24m labeled red blood cells.   RADIOPHARMACEUTICALS:  25 mCi Tc-39m in-vitro labeled red cells.   COMPARISON:  04/19/2012 and CT examination 02/14/2014   FINDINGS: There is activity within the bowel at 20 minutes. This activity is the right abdomen and probably involves the right colon. During the second hour of imaging, the activity appears to move into the transverse colon.   IMPRESSION: Positive GI bleeding examination. Source of the bleeding appears to be coming from the right colon. Ultrasound guidance for vascular  access   Superior mesenteric artery catheterization and angiogram   Date:  10/23/201510/23/2015 5:22 pm   Radiologist:  M. Daryll Brod, MD   Guidance:  Ultrasound and fluoroscopic   FLUOROSCOPY TIME:  5 min 30 seconds   MEDICATIONS AND MEDICAL HISTORY: 12.5 mcg fentanyl   ANESTHESIA/SEDATION: 42 min   CONTRAST:  30mL OMNIPAQUE IOHEXOL 300 MG/ML  SOLN   COMPLICATIONS: None immediate   PROCEDURE: Informed consent was obtained from the patient following explanation of the procedure, risks, benefits and alternatives. The patient understands, agrees and consents for the procedure. All questions were addressed. A time out was performed.   Maximal barrier sterile technique utilized including caps, mask, sterile gowns, sterile gloves, large sterile drape, hand hygiene, and Betadine.   Under sterile conditions and local anesthesia, micropuncture ultrasound access performed of the right common femoral artery. Five French sheath inserted over a guidewire. Sos Omni select catheter reformed in the aorta and utilized to select the SMA. SMA angiogram performed.   SMA: SMA is widely patent. Main ileocolic trunk is patent. Jejunal and colic branches are patent throughout the mesentery. No evidence of contrast extravasation, active bleeding or vascular abnormality. On the venous phase, the mesenteric veins and portal vein are patent.   Cholelithiasis noted as before.   Guidewire and catheter removed. Sheath removed. Hemostasis obtained with manual compression for 15 min. Patient tolerated the procedure well. No immediate complication.   IMPRESSION: Negative SMA angiogram for acute GI bleeding.    ASSESSMENT/PLAN:  Acute blood loss anemia-s/p transfusion.  Recheck Hb. CHF-compensated Dementia-stable HTN-last BP elevated.  Will monitor. Constipation-well controlled Check cbc  I have reviewed patient's medical records received at admission/from hospitalization.  CPT  CODE: 47654  Vrinda Heckstall Y Daylah Sayavong, [place] [phone]

## 2014-02-27 DIAGNOSIS — I10 Essential (primary) hypertension: Secondary | ICD-10-CM | POA: Diagnosis not present

## 2014-02-27 DIAGNOSIS — K219 Gastro-esophageal reflux disease without esophagitis: Secondary | ICD-10-CM | POA: Diagnosis not present

## 2014-02-27 DIAGNOSIS — I5032 Chronic diastolic (congestive) heart failure: Secondary | ICD-10-CM | POA: Diagnosis not present

## 2014-02-27 DIAGNOSIS — K5792 Diverticulitis of intestine, part unspecified, without perforation or abscess without bleeding: Secondary | ICD-10-CM | POA: Diagnosis not present

## 2014-02-27 DIAGNOSIS — K5791 Diverticulosis of intestine, part unspecified, without perforation or abscess with bleeding: Secondary | ICD-10-CM | POA: Diagnosis not present

## 2014-02-27 DIAGNOSIS — R278 Other lack of coordination: Secondary | ICD-10-CM | POA: Diagnosis not present

## 2014-02-27 DIAGNOSIS — I120 Hypertensive chronic kidney disease with stage 5 chronic kidney disease or end stage renal disease: Secondary | ICD-10-CM | POA: Diagnosis not present

## 2014-02-27 DIAGNOSIS — I509 Heart failure, unspecified: Secondary | ICD-10-CM | POA: Diagnosis not present

## 2014-02-27 DIAGNOSIS — J00 Acute nasopharyngitis [common cold]: Secondary | ICD-10-CM | POA: Diagnosis not present

## 2014-02-27 DIAGNOSIS — M6281 Muscle weakness (generalized): Secondary | ICD-10-CM | POA: Diagnosis not present

## 2014-02-27 DIAGNOSIS — R2681 Unsteadiness on feet: Secondary | ICD-10-CM | POA: Diagnosis not present

## 2014-02-27 DIAGNOSIS — N181 Chronic kidney disease, stage 1: Secondary | ICD-10-CM | POA: Diagnosis not present

## 2014-02-27 DIAGNOSIS — K922 Gastrointestinal hemorrhage, unspecified: Secondary | ICD-10-CM | POA: Diagnosis not present

## 2014-02-27 DIAGNOSIS — D62 Acute posthemorrhagic anemia: Secondary | ICD-10-CM | POA: Diagnosis not present

## 2014-02-27 DIAGNOSIS — F0390 Unspecified dementia without behavioral disturbance: Secondary | ICD-10-CM | POA: Diagnosis not present

## 2014-03-14 ENCOUNTER — Encounter: Payer: Self-pay | Admitting: Adult Health

## 2014-03-14 ENCOUNTER — Non-Acute Institutional Stay (SKILLED_NURSING_FACILITY): Payer: Medicare Other | Admitting: Adult Health

## 2014-03-14 DIAGNOSIS — F0390 Unspecified dementia without behavioral disturbance: Secondary | ICD-10-CM | POA: Diagnosis not present

## 2014-03-14 DIAGNOSIS — K5791 Diverticulosis of intestine, part unspecified, without perforation or abscess with bleeding: Secondary | ICD-10-CM | POA: Diagnosis not present

## 2014-03-14 DIAGNOSIS — I5032 Chronic diastolic (congestive) heart failure: Secondary | ICD-10-CM | POA: Diagnosis not present

## 2014-03-14 DIAGNOSIS — D62 Acute posthemorrhagic anemia: Secondary | ICD-10-CM

## 2014-03-14 DIAGNOSIS — I10 Essential (primary) hypertension: Secondary | ICD-10-CM | POA: Diagnosis not present

## 2014-03-14 NOTE — Progress Notes (Signed)
Patient ID: Sarah Perkins, female   DOB: 1918-02-09, 80 y.o.   MRN: [id number]   03/14/14  Facility:  Nursing Home Location:  [place] Room Number: [phone] LEVEL OF CARE:  SNF (31)    Chief Complaint  Patient presents with  . Discharge Note    GI Bleed, Hypertension, Anemia, , Dementia and CHF    HISTORY OF PRESENT ILLNESS:  This is a 80 year old female who is for discharge home with Home health PT, OT and CNA. She has been admitted to [facility] on 02/19/14 from [facility] with primary discharge diagnosis of GI Bleed. No recurrence of GI Bleed. Patient was admitted to this facility for short-term rehabilitation after the patient's recent hospitalization.  Patient has completed SNF rehabilitation and therapy has cleared the patient for discharge.   REASSESSMENT OF ONGOING PROBLEMS:  HTN: Pt 's HTN remains stable.  Denies CP, sob, DOE, pedal edema, headaches, dizziness or visual disturbances.  No complications from the medications currently being used.  Last BP : 110/59  ANEMIA: The anemia has been stable. The patient denies fatigue, melena or hematochezia. No complications from the medications currently being used. 10/15 hgb 10.2  DEMENTIA: The dementia remaines stable and continues to function adequately in the current living environment with supervision.  The patient has had little changes in behavior. No medications presently being used.  PAST MEDICAL HISTORY:  Past Medical History  Diagnosis Date  . CHF (congestive heart failure)   . Hypertension   . Aneurysm 1972    cerebral  . Acid reflux     occasional  . Tremor     worse on right arm  . History of GI diverticular bleed 06/2009    colonoscopy with endo clipping of bleeding tic by Dr Clarene Essex.  Flex sig by Dr Paulita Fujita.  . Acute blood loss anemia 06/2009    received ~ 6 units blood  . Adenomatous polyp of colon 06/2009  . Diverticulitis   . UTI (lower urinary tract  infection)   . GI bleed 02/14/2014    CURRENT MEDICATIONS: Reviewed per MAR/see medication list  No Known Allergies   REVIEW OF SYSTEMS:  GENERAL: no change in appetite, no fatigue, no weight changes, no fever, chills or weakness RESPIRATORY: no cough, SOB, DOE, wheezing, hemoptysis CARDIAC: no chest pain, edema or palpitations GI: no abdominal pain, diarrhea, constipation, heart burn, nausea or vomiting  PHYSICAL EXAMINATION  GENERAL: no acute distress, normal body habitus NECK: supple, trachea midline, no neck masses, no thyroid tenderness, no thyromegaly LYMPHATICS: no LAN in the neck, no supraclavicular LAN RESPIRATORY: breathing is even & unlabored, BS CTAB CARDIAC: RRR, no murmur,no extra heart sounds, no edema GI: abdomen soft, normal BS, no masses, no tenderness, no hepatomegaly, no splenomegaly EXTREMITIES:  Able to move all 4 extremities PSYCHIATRIC: the patient is alert & oriented to person, affect & behavior appropriate  LABS/RADIOLOGY: 03/01/14  No radiographic evidence of acute pulmonary disease. 02/21/14  WBC 7.6 hemoglobin 10.2 hematocrit 30.8 MCV 96.6 Labs reviewed: Basic Metabolic Panel:  Recent Labs  02/14/14 0805 02/15/14 0717 02/17/14 0530  NA 140 142 143  K 4.4 4.1 4.0  CL 105 109 110  CO2 25 23 23   GLUCOSE 102* 87 88  BUN 17 17 14   CREATININE 0.67 0.72 0.67  CALCIUM 9.1 8.3* 8.4   CBC:  Recent Labs  02/14/14 0805 02/14/14 1911 02/15/14 0717 02/16/14 0216  02/18/14 0535 02/18/14 1541 02/19/14 0441  WBC 8.5 7.9 7.0 9.7  --   --   --   --   NEUTROABS 4.8  --  4.0  --   --   --   --   --   HGB 10.2* 8.2* 7.6* 9.2*  < > 10.4* 11.3* 10.6*  HCT 31.0* 24.6* 22.9* 26.4*  < > 30.8* 32.9* 31.3*  MCV 94.5 93.9 94.6 91.0  --   --   --   --   PLT 278 226 212 187  --   --   --   --   < > = values in this interval not displayed.  Nm Gi Blood Loss  02/18/2014   CLINICAL DATA:  80 year old with the acute GI bleeding. History of diverticular  bleeding.  EXAM: NUCLEAR MEDICINE GASTROINTESTINAL BLEEDING SCAN  TECHNIQUE: Sequential abdominal images were obtained following intravenous administration of Tc-87m labeled red blood cells.  RADIOPHARMACEUTICALS:  25 mCi Tc-45m in-vitro labeled red cells.  COMPARISON:  04/19/2012 and CT examination 02/14/2014  FINDINGS: There is activity within the bowel at 20 minutes. This activity is the right abdomen and probably involves the right colon. During the second hour of imaging, the activity appears to move into the transverse colon.  IMPRESSION: Positive GI bleeding examination. Source of the bleeding appears to be coming from the right colon.   Electronically Signed   By: Markus Daft M.D.   On: 02/18/2014 15:12   Ct Abdomen Pelvis W Contrast  02/14/2014   CLINICAL DATA:  Ladies for several months without focal site of pain; no history of nausea, vomiting, diarrhea, or constipation; history of diverticulitis and also adenomatous colonic polyps ; the  EXAM: CT ABDOMEN AND PELVIS WITH CONTRAST  TECHNIQUE: Multidetector CT imaging of the abdomen and pelvis was performed using the standard protocol following bolus administration of intravenous contrast.  CONTRAST:  67mL OMNIPAQUE IOHEXOL 300 MG/ML SOLN intravenously ; the patient also received oral contrast material.  COMPARISON:  Abdominal pelvic CT scan of January 29, 2013.  FINDINGS: There is a moderate-sized hiatal hernia -partially intra thoracic stomach. There is no evidence of a small or large bowel obstruction. There is colonic diverticulosis centered in the sigmoid colon. There is no evidence of acute diverticulitis. There is mild distention of the rectosigmoid with stool and gas which is a stable finding. The appendix is not discretely demonstrated. The terminal ileum is unremarkable.  There are calcified stones layering in the dependent portion of the gallbladder. No pericholecystic inflammatory change is demonstrated. The liver, spleen, pancreas, adrenal  glands, and kidneys exhibit no acute abnormalities. There is cortical thinning of both kidneys. There are vascular calcifications involving branches of the left renal artery. There is a 1.2 cm diameter accessory spleen. The pancreas is largely fatty infiltrated. There are stable hypodensities within the cortex of both kidneys.  The abdominal aorta is normal in caliber and exhibits mural calcification. There is no inguinal hernia. The urinary bladder is partially distended and grossly normal. The uterus is apparently surgically absent but imaging of the pelvis is limited due to beam hardening artifact from the prosthetic right hip joint. There is a small stable fat containing umbilical hernia.  There is stable 60% anterior wedge compression of the body of L1 with stable endplate osteophyte present at T12-L1. There is degenerative disc change at L3-4, L4-5, and L5-S1. The observed portions of the bony pelvis are unremarkable. There is extensive chronic soft tissue calcification lateral to the trochanteric region of the left hip.  IMPRESSION: 1. Gallstones without objective evidence of acute cholecystitis. Chronic cholecystitis could be present. No acute abnormality of the pancreas is noted. 2. There is colonic diverticulosis without evidence of acute diverticulitis. Certainly low-grade diverticulitis could be present without significant CT findings. 3. No acute urinary tract abnormality is demonstrated. 4. Chronic 60% anterior wedge compression of the body of L1.   Electronically Signed   By: David  Martinique   On: 02/14/2014 13:25   Ir Angiogram Visceral Selective  02/18/2014   CLINICAL DATA:  Recurrent acute right lower GI bleeding requiring transfusion. Positive nuclear medicine bleeding scan in the right colon.  EXAM: Ultrasound guidance for vascular access  Superior mesenteric artery catheterization and angiogram  Date:  10/23/201510/23/2015 5:22 pm  Radiologist:  M. Daryll Brod, MD  Guidance:  Ultrasound and  fluoroscopic  FLUOROSCOPY TIME:  5 min 30 seconds  MEDICATIONS AND MEDICAL HISTORY: 12.5 mcg fentanyl  ANESTHESIA/SEDATION: 42 min  CONTRAST:  71mL OMNIPAQUE IOHEXOL 300 MG/ML  SOLN  COMPLICATIONS: None immediate  PROCEDURE: Informed consent was obtained from the patient following explanation of the procedure, risks, benefits and alternatives. The patient understands, agrees and consents for the procedure. All questions were addressed. A time out was performed.  Maximal barrier sterile technique utilized including caps, mask, sterile gowns, sterile gloves, large sterile drape, hand hygiene, and Betadine.  Under sterile conditions and local anesthesia, micropuncture ultrasound access performed of the right common femoral artery. Five French sheath inserted over a guidewire. Sos Omni select catheter reformed in the aorta and utilized to select the SMA. SMA angiogram performed.  SMA: SMA is widely patent. Main ileocolic trunk is patent. Jejunal and colic branches are patent throughout the mesentery. No evidence of contrast extravasation, active bleeding or vascular abnormality. On the venous phase, the mesenteric veins and portal vein are patent.  Cholelithiasis noted as before.  Guidewire and catheter removed. Sheath removed. Hemostasis obtained with manual compression for 15 min. Patient tolerated the procedure well. No immediate complication.  IMPRESSION: Negative SMA angiogram for acute GI bleeding.   Electronically Signed   By: Daryll Brod M.D.   On: 02/18/2014 17:31   Ir US Guide Vasc Access Right  02/18/2014   CLINICAL DATA:  Recurrent acute right lower GI bleeding requiring transfusion. Positive nuclear medicine bleeding scan in the right colon.  EXAM: Ultrasound guidance for vascular access  Superior mesenteric artery catheterization and angiogram  Date:  10/23/201510/23/2015 5:22 pm  Radiologist:  M. Daryll Brod, MD  Guidance:  Ultrasound and fluoroscopic  FLUOROSCOPY TIME:  5 min 30 seconds   MEDICATIONS AND MEDICAL HISTORY: 12.5 mcg fentanyl  ANESTHESIA/SEDATION: 42 min  CONTRAST:  30mL OMNIPAQUE IOHEXOL 300 MG/ML  SOLN  COMPLICATIONS: None immediate  PROCEDURE: Informed consent was obtained from the patient following explanation of the procedure, risks, benefits and alternatives. The patient understands, agrees and consents for the procedure. All questions were addressed. A time out was performed.  Maximal barrier sterile technique utilized including caps, mask, sterile gowns, sterile gloves, large sterile drape, hand hygiene, and Betadine.  Under sterile conditions and local anesthesia, micropuncture ultrasound access performed of the right common femoral artery. Five French sheath inserted over a guidewire. Sos Omni select catheter reformed in the aorta and utilized to select the SMA. SMA angiogram performed.  SMA: SMA is widely patent. Main ileocolic trunk is patent. Jejunal and colic branches are patent throughout the mesentery. No evidence of contrast extravasation, active bleeding or vascular abnormality. On the venous phase, the mesenteric veins  and portal vein are patent.  Cholelithiasis noted as before.  Guidewire and catheter removed. Sheath removed. Hemostasis obtained with manual compression for 15 min. Patient tolerated the procedure well. No immediate complication.  IMPRESSION: Negative SMA angiogram for acute GI bleeding.   Electronically Signed   By: Daryll Brod M.D.   On: 02/18/2014 17:31    ASSESSMENT/PLAN:  GI bleed - suspected Diverticulosis cause; no recurrence  Anemia, acute posthemorrhagic - S/P 3 units PRBC; continue ferrous sulfate 325 mg by mouth daily; check CBC Hypertension - well controlled; continue Norvasc 5 mg by mouth daily Chronic diastolic CHF - stable; continue Lasix 20 mg by mouth daily; check BMP Dementia - not on any medication   I have filled out patient's discharge paperwork and written prescriptions.  Patient will receive home health PT, OT and  Nursing.  Total discharge time: Less than 30 minutes  Discharge time involved coordination of the discharge process with Education officer, museum, nursing staff and therapy department. Medical justification for home health services verified.    CPT CODE: 91660    MEDINA-VARGAS,Senia Even, [facility] [phone]

## 2014-03-17 DIAGNOSIS — I129 Hypertensive chronic kidney disease with stage 1 through stage 4 chronic kidney disease, or unspecified chronic kidney disease: Secondary | ICD-10-CM

## 2014-03-17 DIAGNOSIS — F0390 Unspecified dementia without behavioral disturbance: Secondary | ICD-10-CM | POA: Diagnosis not present

## 2014-03-17 DIAGNOSIS — K922 Gastrointestinal hemorrhage, unspecified: Secondary | ICD-10-CM

## 2014-03-17 DIAGNOSIS — M6281 Muscle weakness (generalized): Secondary | ICD-10-CM | POA: Diagnosis not present

## 2014-03-21 DIAGNOSIS — M6281 Muscle weakness (generalized): Secondary | ICD-10-CM | POA: Diagnosis not present

## 2014-03-21 DIAGNOSIS — K922 Gastrointestinal hemorrhage, unspecified: Secondary | ICD-10-CM | POA: Diagnosis not present

## 2014-03-25 DIAGNOSIS — K922 Gastrointestinal hemorrhage, unspecified: Secondary | ICD-10-CM | POA: Diagnosis not present

## 2014-03-25 DIAGNOSIS — M6281 Muscle weakness (generalized): Secondary | ICD-10-CM | POA: Diagnosis not present

## 2014-03-26 DIAGNOSIS — K922 Gastrointestinal hemorrhage, unspecified: Secondary | ICD-10-CM | POA: Diagnosis not present

## 2014-03-26 DIAGNOSIS — M6281 Muscle weakness (generalized): Secondary | ICD-10-CM | POA: Diagnosis not present

## 2014-03-28 DIAGNOSIS — M6281 Muscle weakness (generalized): Secondary | ICD-10-CM | POA: Diagnosis not present

## 2014-03-28 DIAGNOSIS — K922 Gastrointestinal hemorrhage, unspecified: Secondary | ICD-10-CM | POA: Diagnosis not present

## 2014-03-30 DIAGNOSIS — K922 Gastrointestinal hemorrhage, unspecified: Secondary | ICD-10-CM | POA: Diagnosis not present

## 2014-03-30 DIAGNOSIS — M6281 Muscle weakness (generalized): Secondary | ICD-10-CM | POA: Diagnosis not present

## 2014-04-01 DIAGNOSIS — K922 Gastrointestinal hemorrhage, unspecified: Secondary | ICD-10-CM | POA: Diagnosis not present

## 2014-04-01 DIAGNOSIS — M6281 Muscle weakness (generalized): Secondary | ICD-10-CM | POA: Diagnosis not present

## 2014-04-04 DIAGNOSIS — M6281 Muscle weakness (generalized): Secondary | ICD-10-CM | POA: Diagnosis not present

## 2014-04-04 DIAGNOSIS — K922 Gastrointestinal hemorrhage, unspecified: Secondary | ICD-10-CM | POA: Diagnosis not present

## 2014-04-06 DIAGNOSIS — M6281 Muscle weakness (generalized): Secondary | ICD-10-CM | POA: Diagnosis not present

## 2014-04-06 DIAGNOSIS — K922 Gastrointestinal hemorrhage, unspecified: Secondary | ICD-10-CM | POA: Diagnosis not present

## 2014-04-11 DIAGNOSIS — M6281 Muscle weakness (generalized): Secondary | ICD-10-CM | POA: Diagnosis not present

## 2014-04-11 DIAGNOSIS — K922 Gastrointestinal hemorrhage, unspecified: Secondary | ICD-10-CM | POA: Diagnosis not present

## 2014-04-14 DIAGNOSIS — K922 Gastrointestinal hemorrhage, unspecified: Secondary | ICD-10-CM | POA: Diagnosis not present

## 2014-04-14 DIAGNOSIS — M6281 Muscle weakness (generalized): Secondary | ICD-10-CM | POA: Diagnosis not present

## 2014-05-04 DIAGNOSIS — Z0000 Encounter for general adult medical examination without abnormal findings: Secondary | ICD-10-CM | POA: Diagnosis not present

## 2014-05-04 DIAGNOSIS — Z1389 Encounter for screening for other disorder: Secondary | ICD-10-CM | POA: Diagnosis not present

## 2014-05-04 DIAGNOSIS — M810 Age-related osteoporosis without current pathological fracture: Secondary | ICD-10-CM | POA: Diagnosis not present

## 2014-05-04 DIAGNOSIS — I10 Essential (primary) hypertension: Secondary | ICD-10-CM | POA: Diagnosis not present

## 2014-05-05 DIAGNOSIS — N390 Urinary tract infection, site not specified: Secondary | ICD-10-CM | POA: Diagnosis not present

## 2014-05-10 DIAGNOSIS — K219 Gastro-esophageal reflux disease without esophagitis: Secondary | ICD-10-CM | POA: Diagnosis not present

## 2014-05-10 DIAGNOSIS — I10 Essential (primary) hypertension: Secondary | ICD-10-CM | POA: Diagnosis not present

## 2014-05-10 DIAGNOSIS — N183 Chronic kidney disease, stage 3 (moderate): Secondary | ICD-10-CM | POA: Diagnosis not present

## 2014-05-10 DIAGNOSIS — M810 Age-related osteoporosis without current pathological fracture: Secondary | ICD-10-CM | POA: Diagnosis not present

## 2014-08-21 ENCOUNTER — Inpatient Hospital Stay (HOSPITAL_COMMUNITY)
Admission: EM | Admit: 2014-08-21 | Discharge: 2014-08-24 | DRG: 378 | Disposition: A | Discharge status: Skilled Nursing Facility | Payer: Medicare Other | Attending: Internal Medicine | Admitting: Internal Medicine

## 2014-08-21 ENCOUNTER — Encounter (HOSPITAL_COMMUNITY): Payer: Self-pay | Admitting: Nurse Practitioner

## 2014-08-21 DIAGNOSIS — I10 Essential (primary) hypertension: Secondary | ICD-10-CM | POA: Diagnosis present

## 2014-08-21 DIAGNOSIS — K922 Gastrointestinal hemorrhage, unspecified: Secondary | ICD-10-CM | POA: Diagnosis not present

## 2014-08-21 DIAGNOSIS — M6281 Muscle weakness (generalized): Secondary | ICD-10-CM | POA: Diagnosis not present

## 2014-08-21 DIAGNOSIS — S80269A Insect bite (nonvenomous), unspecified knee, initial encounter: Secondary | ICD-10-CM | POA: Diagnosis not present

## 2014-08-21 DIAGNOSIS — F0390 Unspecified dementia without behavioral disturbance: Secondary | ICD-10-CM | POA: Diagnosis not present

## 2014-08-21 DIAGNOSIS — D649 Anemia, unspecified: Secondary | ICD-10-CM | POA: Diagnosis not present

## 2014-08-21 DIAGNOSIS — K5731 Diverticulosis of large intestine without perforation or abscess with bleeding: Secondary | ICD-10-CM

## 2014-08-21 DIAGNOSIS — K5790 Diverticulosis of intestine, part unspecified, without perforation or abscess without bleeding: Secondary | ICD-10-CM | POA: Diagnosis present

## 2014-08-21 DIAGNOSIS — D62 Acute posthemorrhagic anemia: Secondary | ICD-10-CM | POA: Diagnosis present

## 2014-08-21 DIAGNOSIS — K219 Gastro-esophageal reflux disease without esophagitis: Secondary | ICD-10-CM | POA: Diagnosis present

## 2014-08-21 DIAGNOSIS — I671 Cerebral aneurysm, nonruptured: Secondary | ICD-10-CM | POA: Diagnosis not present

## 2014-08-21 DIAGNOSIS — N189 Chronic kidney disease, unspecified: Secondary | ICD-10-CM | POA: Diagnosis not present

## 2014-08-21 DIAGNOSIS — K625 Hemorrhage of anus and rectum: Secondary | ICD-10-CM | POA: Diagnosis present

## 2014-08-21 DIAGNOSIS — Z86010 Personal history of colonic polyps: Secondary | ICD-10-CM

## 2014-08-21 DIAGNOSIS — K5791 Diverticulosis of intestine, part unspecified, without perforation or abscess with bleeding: Secondary | ICD-10-CM | POA: Diagnosis not present

## 2014-08-21 DIAGNOSIS — R279 Unspecified lack of coordination: Secondary | ICD-10-CM | POA: Diagnosis not present

## 2014-08-21 DIAGNOSIS — R251 Tremor, unspecified: Secondary | ICD-10-CM | POA: Diagnosis not present

## 2014-08-21 DIAGNOSIS — R0602 Shortness of breath: Secondary | ICD-10-CM | POA: Diagnosis not present

## 2014-08-21 DIAGNOSIS — N390 Urinary tract infection, site not specified: Secondary | ICD-10-CM | POA: Diagnosis not present

## 2014-08-21 DIAGNOSIS — I509 Heart failure, unspecified: Secondary | ICD-10-CM | POA: Diagnosis present

## 2014-08-21 DIAGNOSIS — K5900 Constipation, unspecified: Secondary | ICD-10-CM | POA: Diagnosis not present

## 2014-08-21 DIAGNOSIS — D509 Iron deficiency anemia, unspecified: Secondary | ICD-10-CM | POA: Diagnosis not present

## 2014-08-21 DIAGNOSIS — K5793 Diverticulitis of intestine, part unspecified, without perforation or abscess with bleeding: Secondary | ICD-10-CM | POA: Diagnosis not present

## 2014-08-21 LAB — CBC
HEMATOCRIT: 34.6 % — AB (ref 36.0–46.0)
Hemoglobin: 11.4 g/dL — ABNORMAL LOW (ref 12.0–15.0)
MCH: 30.4 pg (ref 26.0–34.0)
MCHC: 32.9 g/dL (ref 30.0–36.0)
MCV: 92.3 fL (ref 78.0–100.0)
PLATELETS: 245 10*3/uL (ref 150–400)
RBC: 3.75 MIL/uL — AB (ref 3.87–5.11)
RDW: 13.3 % (ref 11.5–15.5)
WBC: 7.8 10*3/uL (ref 4.0–10.5)

## 2014-08-21 LAB — COMPREHENSIVE METABOLIC PANEL
ALT: 10 U/L (ref 0–35)
AST: 16 U/L (ref 0–37)
Albumin: 2.8 g/dL — ABNORMAL LOW (ref 3.5–5.2)
Alkaline Phosphatase: 52 U/L (ref 39–117)
Anion gap: 6 (ref 5–15)
BILIRUBIN TOTAL: 0.3 mg/dL (ref 0.3–1.2)
BUN: 11 mg/dL (ref 6–23)
CHLORIDE: 105 mmol/L (ref 96–112)
CO2: 29 mmol/L (ref 19–32)
CREATININE: 0.72 mg/dL (ref 0.50–1.10)
Calcium: 9.1 mg/dL (ref 8.4–10.5)
GFR, EST AFRICAN AMERICAN: 83 mL/min — AB (ref >90)
GFR, EST NON AFRICAN AMERICAN: 71 mL/min — AB (ref >90)
Glucose, Bld: 127 mg/dL — ABNORMAL HIGH (ref 70–99)
Potassium: 3.6 mmol/L (ref 3.5–5.1)
Sodium: 140 mmol/L (ref 135–145)
TOTAL PROTEIN: 8.3 g/dL (ref 6.0–8.3)

## 2014-08-21 LAB — POC OCCULT BLOOD, ED: Fecal Occult Bld: POSITIVE — AB

## 2014-08-21 MED ORDER — AMLODIPINE BESYLATE 5 MG PO TABS
5.0000 mg | ORAL_TABLET | Freq: Every day | ORAL | Status: DC
  Administered 2014-08-21 – 2014-08-24 (×4): 5 mg via ORAL
  Filled 2014-08-21 (×4): qty 1

## 2014-08-21 MED ORDER — SODIUM CHLORIDE 0.9 % IV SOLN: INTRAVENOUS | Status: DC

## 2014-08-21 MED ORDER — FERROUS SULFATE 325 (65 FE) MG PO TABS
325.0000 mg | ORAL_TABLET | Freq: Every day | ORAL | Status: DC
  Administered 2014-08-22 – 2014-08-24 (×3): 325 mg via ORAL
  Filled 2014-08-21 (×4): qty 1

## 2014-08-21 MED ORDER — SODIUM CHLORIDE 0.9 % IV SOLN
INTRAVENOUS | Status: DC
  Administered 2014-08-21 – 2014-08-23 (×3): via INTRAVENOUS

## 2014-08-21 MED ORDER — SODIUM CHLORIDE 0.9 % IJ SOLN
3.0000 mL | Freq: Two times a day (BID) | INTRAMUSCULAR | Status: DC
  Administered 2014-08-24: 3 mL via INTRAVENOUS

## 2014-08-21 NOTE — ED Notes (Signed)
Report attempted 

## 2014-08-21 NOTE — H&P (Signed)
[facility] History and Physical  Sarah Perkins GXQ:[id number] DOB: 16-Nov-1917 DOA: 08/21/2014  Referring physician: EDP PCP: Thressa Sheller, MD   Chief Complaint: BRBPR   HPI: [name] is a 81 y.o. female with extensive history of recurrent episodes of BRBPR presumed to be diverticular bleeds.  Last admit for same was in October of last year.  Patient has required blood transfusions in past.  Today patient had bright red blood in the comode x2 at home and 1 time here in ED.  No anticoagulants, has mild dizziness.  Review of Systems: Unable to perform due to dementia.  Past Medical History  Diagnosis Date  . CHF (congestive heart failure)   . Hypertension   . Aneurysm 1972    cerebral  . Acid reflux     occasional  . Tremor     worse on right arm  . History of GI diverticular bleed 06/2009    colonoscopy with endo clipping of bleeding tic by Dr Clarene Essex.  Flex sig by Dr Paulita Fujita.  . Acute blood loss anemia 06/2009    received ~ 6 units blood  . Adenomatous polyp of colon 06/2009  . Diverticulitis   . UTI (lower urinary tract infection)   . GI bleed 02/14/2014   Past Surgical History  Procedure Laterality Date  . Hip fracture surgery  2011  . Colonoscopy w/ control of hemorrhage  06/2009  . Knee arthroscopy    . Cerebral aneurysm repair  1970's  . Colonoscopy  09/22/2011    Procedure: COLONOSCOPY;  Surgeon: Juanita Craver, MD;  Location: [facility] ENDOSCOPY;  Service: Endoscopy;  Laterality: N/A;  wants ped scope   Social History:  reports that she has never smoked. She has never used smokeless tobacco. She reports that she does not drink alcohol or use illicit drugs.  No Known Allergies  History reviewed. No pertinent family history.   Prior to Admission medications   Medication Sig Start Date End Date Taking? Authorizing Provider  amLODipine (NORVASC) 5 MG tablet Take 5 mg by mouth daily.   Yes Historical Provider, MD  Ascorbic Acid (VITAMIN C) 100 MG  tablet Take 100 mg by mouth daily. With rose hips 500 mg   Yes Historical Provider, MD  cholecalciferol (VITAMIN D) 1000 UNITS tablet Take 1,000 Units by mouth daily.   Yes Historical Provider, MD  docusate sodium (COLACE) 100 MG capsule Take 100 mg by mouth at bedtime.   Yes Historical Provider, MD  ferrous sulfate 325 (65 FE) MG tablet Take 325 mg by mouth daily with breakfast.   Yes Historical Provider, MD  furosemide (LASIX) 20 MG tablet Take 10 mg by mouth daily.    Yes Historical Provider, MD  Multiple Minerals (CALCIUM-MAGNESIUM-ZINC) TABS Take 1 tablet by mouth daily.   Yes Historical Provider, MD   Physical Exam: Filed Vitals:   08/21/14 1930  BP: 164/61  Pulse:   Temp:   Resp:     BP 164/61 mmHg  Pulse 85  Temp(Src) 98 F (36.7 C)  Resp 15  SpO2 94%  General Appearance:    Alert, oriented, no distress, appears stated age  Head:    Normocephalic, atraumatic  Eyes:    PERRL, EOMI, sclera non-icteric        Nose:   Nares without drainage or epistaxis. Mucosa, turbinates normal  Throat:   Moist mucous membranes. Oropharynx without erythema or exudate.  Neck:   Supple. No carotid bruits.  No thyromegaly.  No lymphadenopathy.  Back:     No CVA tenderness, no spinal tenderness  Lungs:     Clear to auscultation bilaterally, without wheezes, rhonchi or rales  Chest wall:    No tenderness to palpitation  Heart:    Regular rate and rhythm without murmurs, gallops, rubs  Abdomen:     Soft, non-tender, nondistended, normal bowel sounds, no organomegaly  Genitalia:    deferred  Rectal:    deferred  Extremities:   No clubbing, cyanosis or edema.  Pulses:   2+ and symmetric all extremities  Skin:   Skin color, texture, turgor normal, no rashes or lesions  Lymph nodes:   Cervical, supraclavicular, and axillary nodes normal  Neurologic:   CNII-XII intact. Normal strength, sensation and reflexes      throughout    Labs on Admission:  Basic Metabolic Panel:  Recent Labs Lab  08/21/14 1509  NA 140  K 3.6  CL 105  CO2 29  GLUCOSE 127*  BUN 11  CREATININE 0.72  CALCIUM 9.1   Liver Function Tests:  Recent Labs Lab 08/21/14 1509  AST 16  ALT 10  ALKPHOS 52  BILITOT 0.3  PROT 8.3  ALBUMIN 2.8*   No results for input(s): LIPASE, AMYLASE in the last 168 hours. No results for input(s): AMMONIA in the last 168 hours. CBC:  Recent Labs Lab 08/21/14 1509  WBC 7.8  HGB 11.4*  HCT 34.6*  MCV 92.3  PLT 245   Cardiac Enzymes: No results for input(s): CKTOTAL, CKMB, CKMBINDEX, TROPONINI in the last 168 hours.  BNP (last 3 results) No results for input(s): PROBNP in the last 8760 hours. CBG: No results for input(s): GLUCAP in the last 168 hours.  Radiological Exams on Admission: No results found.  EKG: Independently reviewed.  Assessment/Plan Principal Problem:   Rectal bleeding Active Problems:   HTN (hypertension)   Diverticulosis   1. GI bleed due to diverticular bleeding - recurrent 1. H/H q4h ordered 2. HGB 11 initially 3. 3 units PRBC are being crossmatched in blood bank and set asside if she needs them.  Patient is Anti-E positive now. 4. Transfuse if needed 5. Tele monitor 6. IVF 75 cc/hr, hold her home 10mg  lasix she takes.   Code Status: Full code - discussed with son who wants this right now. Family Communication: Son at bedside Disposition Plan: Admit to inpatient   Time spent: 70 min  GARDNER, JARED M. [facility] Pager [phone]  If 7AM-7PM, please contact the day team taking care of the patient [email or web address] Password [facility] 08/21/2014, 8:07 PM

## 2014-08-21 NOTE — ED Notes (Signed)
Family reports the pt had 2 episodes of bright red bloody bowel movements this morning. She denies n/v. She denies pain. She has had to receive blood transfusion for GI BLeed in the past

## 2014-08-21 NOTE — ED Provider Notes (Signed)
CSN: [id number]     Arrival date & time 08/21/14  1425 History   First MD Initiated Contact with Patient 08/21/14 1546     Chief Complaint  Patient presents with  . Rectal Bleeding     (Consider location/radiation/quality/duration/timing/severity/associated sxs/prior Treatment) Patient is a 81 y.o. female presenting with hematochezia. The history is provided by a relative. The history is limited by the condition of the patient (Dementia).  Rectal Bleeding She has a history of rectal bleeding which has been assumed to be due to diverticulosis. She was noted to have slight amount of red with her stool yesterday. Today, she had bright red blood in the commode. She denies abdominal pain, nausea, vomiting. She is not on any anticoagulants. She does complain of mild dizziness but history is not felt to be reliable.  Past Medical History  Diagnosis Date  . CHF (congestive heart failure)   . Hypertension   . Aneurysm 1972    cerebral  . Acid reflux     occasional  . Tremor     worse on right arm  . History of GI diverticular bleed 06/2009    colonoscopy with endo clipping of bleeding tic by Dr Clarene Essex.  Flex sig by Dr Paulita Fujita.  . Acute blood loss anemia 06/2009    received ~ 6 units blood  . Adenomatous polyp of colon 06/2009  . Diverticulitis   . UTI (lower urinary tract infection)   . GI bleed 02/14/2014   Past Surgical History  Procedure Laterality Date  . Hip fracture surgery  2011  . Colonoscopy w/ control of hemorrhage  06/2009  . Knee arthroscopy    . Cerebral aneurysm repair  1970's  . Colonoscopy  09/22/2011    Procedure: COLONOSCOPY;  Surgeon: Juanita Craver, MD;  Location: [facility] ENDOSCOPY;  Service: Endoscopy;  Laterality: N/A;  wants ped scope   History reviewed. No pertinent family history. History  Substance Use Topics  . Smoking status: Never Smoker   . Smokeless tobacco: Never Used  . Alcohol Use: No   OB History    No data available     Review of Systems  Unable  to perform ROS: Dementia  Gastrointestinal: Positive for hematochezia.      Allergies  Review of patient's allergies indicates no known allergies.  Home Medications   Prior to Admission medications   Medication Sig Start Date End Date Taking? Authorizing Provider  amLODipine (NORVASC) 5 MG tablet Take 5 mg by mouth daily.    Historical Provider, MD  Ascorbic Acid (VITAMIN C) 100 MG tablet Take 100 mg by mouth daily. With rose hips 500 mg    Historical Provider, MD  cholecalciferol (VITAMIN D) 1000 UNITS tablet Take 1,000 Units by mouth daily.    Historical Provider, MD  docusate sodium (COLACE) 100 MG capsule Take 100 mg by mouth at bedtime.    Historical Provider, MD  ferrous sulfate 325 (65 FE) MG tablet Take 325 mg by mouth daily with breakfast.    Historical Provider, MD  furosemide (LASIX) 20 MG tablet Take 20 mg by mouth daily.    Historical Provider, MD  OVER THE COUNTER MEDICATION Take 1 tablet by mouth daily. Calcium, Magnesium and Zinc    Historical Provider, MD   BP 139/74 mmHg  Pulse 86  Temp(Src) 98 F (36.7 C)  Resp 16  SpO2 96% Physical Exam  Nursing note and vitals reviewed.  81 year old female, resting comfortably and in no acute distress. Vital signs  are normal. Oxygen saturation is 96%, which is normal. Head is normocephalic and atraumatic. PERRLA, EOMI. Oropharynx is clear. Neck is nontender and supple without adenopathy or JVD. Back is nontender and there is no CVA tenderness. Lungs are clear without rales, wheezes, or rhonchi. Chest is nontender. Heart has regular rate and rhythm without murmur. Abdomen is soft, flat, nontender without masses or hepatosplenomegaly and peristalsis is normoactive. Umbilical hernia is present and easily reducible. Rectal: Normal sphincter tone. Dark red to maroon stool is present. Extremities have no cyanosis or edema, full range of motion is present. Skin is warm and dry without rash. Neurologic: She is oriented to person  and place, cranial nerves are intact, there are no motor or sensory deficits.  ED Course  Procedures (including critical care time) Labs Review Results for orders placed or performed during the hospital encounter of 08/21/14  CBC  Result Value Ref Range   WBC 7.8 4.0 - 10.5 K/uL   RBC 3.75 (L) 3.87 - 5.11 MIL/uL   Hemoglobin 11.4 (L) 12.0 - 15.0 g/dL   HCT 34.6 (L) 36.0 - 46.0 %   MCV 92.3 78.0 - 100.0 fL   MCH 30.4 26.0 - 34.0 pg   MCHC 32.9 30.0 - 36.0 g/dL   RDW 13.3 11.5 - 15.5 %   Platelets 245 150 - 400 K/uL  Comprehensive metabolic panel  Result Value Ref Range   Sodium 140 135 - 145 mmol/L   Potassium 3.6 3.5 - 5.1 mmol/L   Chloride 105 96 - 112 mmol/L   CO2 29 19 - 32 mmol/L   Glucose, Bld 127 (H) 70 - 99 mg/dL   BUN 11 6 - 23 mg/dL   Creatinine, Ser 0.72 0.50 - 1.10 mg/dL   Calcium 9.1 8.4 - 10.5 mg/dL   Total Protein 8.3 6.0 - 8.3 g/dL   Albumin 2.8 (L) 3.5 - 5.2 g/dL   AST 16 0 - 37 U/L   ALT 10 0 - 35 U/L   Alkaline Phosphatase 52 39 - 117 U/L   Total Bilirubin 0.3 0.3 - 1.2 mg/dL   GFR calc non Af Amer 71 (L) >90 mL/min   GFR calc Af Amer 83 (L) >90 mL/min   Anion gap 6 5 - 15  POC occult blood, ED  Result Value Ref Range   Fecal Occult Bld POSITIVE (A) NEGATIVE  Type and screen  Result Value Ref Range   ABO/RH(D) B POS    Antibody Screen POS    Sample Expiration 08/24/2014    Antibody Identification ANTI E    PT AG Type NEGATIVE FOR E ANTIGEN    DAT, IgG NEG      MDM   Final diagnoses:  Rectal bleeding  Normochromic normocytic anemia    Rectal bleeding. Old records are reviewed showing hospitalizations for rectal bleeding. On last admission, she was pain sent for embolization to stop bleeding at time of angiography, they could not find any active extravasation of contrast. Screening labs are obtained, and hemoglobin is unchanged from baseline. Given her history, she will need to be watched overnight for serial hemoglobins. Case is discussed with  Dr. Alcario Drought [facility] who agrees to admit the patient.    Delora Fuel, MD 31/51/76 [id number]

## 2014-08-22 LAB — BASIC METABOLIC PANEL
Anion gap: 7 (ref 5–15)
BUN: 12 mg/dL (ref 6–23)
CALCIUM: 8.2 mg/dL — AB (ref 8.4–10.5)
CHLORIDE: 106 mmol/L (ref 96–112)
CO2: 25 mmol/L (ref 19–32)
CREATININE: 0.68 mg/dL (ref 0.50–1.10)
GFR calc Af Amer: 84 mL/min — ABNORMAL LOW (ref >90)
GFR calc non Af Amer: 73 mL/min — ABNORMAL LOW (ref >90)
GLUCOSE: 88 mg/dL (ref 70–99)
Potassium: 3.7 mmol/L (ref 3.5–5.1)
Sodium: 138 mmol/L (ref 135–145)

## 2014-08-22 LAB — MRSA PCR SCREENING: MRSA by PCR: NEGATIVE

## 2014-08-22 LAB — HEMOGLOBIN AND HEMATOCRIT, BLOOD
HCT: 26.2 % — ABNORMAL LOW (ref 36.0–46.0)
HCT: 28.4 % — ABNORMAL LOW (ref 36.0–46.0)
HEMATOCRIT: 29 % — AB (ref 36.0–46.0)
HEMOGLOBIN: 9.6 g/dL — AB (ref 12.0–15.0)
Hemoglobin: 8.8 g/dL — ABNORMAL LOW (ref 12.0–15.0)
Hemoglobin: 9.6 g/dL — ABNORMAL LOW (ref 12.0–15.0)

## 2014-08-22 LAB — DIFFERENTIAL
Basophils Absolute: 0 10*3/uL (ref 0.0–0.1)
Basophils Relative: 0 % (ref 0–1)
EOS PCT: 2 % (ref 0–5)
Eosinophils Absolute: 0.2 10*3/uL (ref 0.0–0.7)
LYMPHS ABS: 2.3 10*3/uL (ref 0.7–4.0)
LYMPHS PCT: 29 % (ref 12–46)
Monocytes Absolute: 1 10*3/uL (ref 0.1–1.0)
Monocytes Relative: 13 % — ABNORMAL HIGH (ref 3–12)
Neutro Abs: 4.4 10*3/uL (ref 1.7–7.7)
Neutrophils Relative %: 56 % (ref 43–77)

## 2014-08-22 LAB — PREPARE RBC (CROSSMATCH)

## 2014-08-22 MED ORDER — CHLORHEXIDINE GLUCONATE 0.12 % MT SOLN
15.0000 mL | Freq: Two times a day (BID) | OROMUCOSAL | Status: DC
  Administered 2014-08-22 – 2014-08-24 (×4): 15 mL via OROMUCOSAL
  Filled 2014-08-22 (×7): qty 15

## 2014-08-22 MED ORDER — CETYLPYRIDINIUM CHLORIDE 0.05 % MT LIQD
7.0000 mL | Freq: Two times a day (BID) | OROMUCOSAL | Status: DC
  Administered 2014-08-22: 7 mL via OROMUCOSAL

## 2014-08-22 NOTE — Clinical Social Work Note (Signed)
Clinical Social Work Assessment  Patient Details  Name: Sarah Perkins MRN: [id number] Date of Birth: 1917/05/14  Date of referral:  08/22/14               Reason for consult:  Discharge Planning                Permission sought to share information with:  Facility Sport and exercise psychologist (Agreeable to CSW communicating with facilities) Permission granted to share information::  Yes, Verbal Permission Granted  Name::        Agency::     Relationship::     Contact Information:     Housing/Transportation Living arrangements for the past 2 months:  Single Family Home Source of Information:  Adult Children Patient Interpreter Needed:  None Criminal Activity/Legal Involvement Pertinent to Current Situation/Hospitalization:  No - Comment as needed Significant Relationships:  Adult Children Lives with:  Adult Children Do you feel safe going back to the place where you live?  Yes Need for family participation in patient care:  Yes (Comment)  Care giving concerns:  Patient's son feels that the patient would benefit from a short term rehab stay at [facility].   Social Worker assessment / plan:  Patient would like for the patient to go to [facility] or [facility] at discharge. CSW explained SNF search/placement process and answered family's questions. Patient lives with her son who is very supportive.  Employment status:  Retired Forensic scientist:  Medicare PT Recommendations:  [place] / Referral to community resources:  [place]  Patient/Family's Response to care:  Patient's son is grateful for assistance of CSW.   Patient/Family's Understanding of and Emotional Response to Diagnosis, Current Treatment, and Prognosis:  Patient's son seems to have a good understanding of why the patient was admitted as he states, "We are here for what she came in for last time." CSW is familiar with the patient and family and notes that did have issues with  bleeding during last admission. Son appeared calm and reassured by the quality of care the patient is currently receiving.  Emotional Assessment Appearance:  Appears stated age Attitude/Demeanor/Rapport:  Other (Patient appropriate) Affect (typically observed):  Accepting, Appropriate, Calm, Flat Orientation:  Oriented to Self, Oriented to Place, Oriented to  Time Alcohol / Substance use:  Never Used Psych involvement (Current and /or in the community):  No (Comment)  Discharge Needs  Concerns to be addressed:  Discharge Planning Concerns Readmission within the last 30 days:  No Current discharge risk:  None Barriers to Discharge:  Continued Medical Work up   [employer] MSW, [place], [place], [phone]

## 2014-08-22 NOTE — Clinical Social Work Placement (Signed)
   CLINICAL SOCIAL WORK PLACEMENT  NOTE  Date:  08/22/2014  Patient Details  Name: ITZIA CUNLIFFE MRN: [id number] Date of Birth: 1918/04/13  Clinical Social Work is seeking post-discharge placement for this patient at the [place] level of care (*CSW will initial, date and re-position this form in  chart as items are completed):  Yes   Patient/family provided with [place] Work Department's list of facilities offering this level of care within the geographic area requested by the patient (or if unable, by the patient's family).  Yes   Patient/family informed of their freedom to choose among providers that offer the needed level of care, that participate in Medicare, Medicaid or managed care program needed by the patient, have an available bed and are willing to accept the patient.  Yes   Patient/family informed of [place]'s ownership interest in [facility] and [facility], as well as of the fact that they are under no obligation to receive care at these facilities.  PASRR submitted to EDS on       PASRR number received on       Existing PASRR number confirmed on 08/22/14     FL2 transmitted to all facilities in geographic area requested by pt/family on       FL2 transmitted to all facilities within larger geographic area on 08/22/14     Patient informed that his/her managed care company has contracts with or will negotiate with certain facilities, including the following:            Patient/family informed of bed offers received.  Patient chooses bed at       Physician recommends and patient chooses bed at      Patient to be transferred to   on  .  Patient to be transferred to facility by       Patient family notified on   of transfer.  Name of family member notified:        PHYSICIAN       Additional Comment:    _______________________________________________ Liz Beach MSW, [place], [place], [phone]

## 2014-08-22 NOTE — Progress Notes (Signed)
[facility]                                                                              Patient Demographics  Sarah Perkins, is a 81 y.o. female, DOB - [birth year], [id number]  Admit date - 08/21/2014   Admitting Physician Etta Quill, DO  Outpatient Primary MD for the patient is Thressa Sheller, MD  LOS - 1   Chief Complaint  Patient presents with  . Rectal Bleeding       Brief HPI   Patient is a 81 y.o. female with extensive history of recurrent episodes of BRBPR presumed to be diverticular bleeds. Last admit for same was in October of last year. Patient has required blood transfusions in past. Today patient had bright red blood in the comode x2 at home and 1 time here in ED. No anticoagulants, has mild dizziness.   Assessment & Plan    Principal Problem:   Rectal bleeding; likely recurrent diverticular bleeding, painless - Hemoglobin currently 8.8, continue every 8 hours, may need further transfusion if less than 8 - Patient has known history of diverticulosis, conservative management for now, if gross bleeding, will need to discuss with gastroenterology - Start on clear liquid diet, advance to soft diet if tolerated, monitor for next 24 hours for any further bleeding  Active Problems:   HTN (hypertension) -Currently stable, continue amlodipine     Diverticulosis - Monitor H&H, no abdominal pain to suggest diverticulitis  Code Status: Full code  Family Communication: Discussed in detail with the patient, all imaging results, lab results explained to the patient   Disposition Plan: Hopefully next 24-48 hours if remains stable   Time Spent in minutes   25 minutes  Procedures  None  Consults   None  DVT Prophylaxis   SCD's  Medications  Scheduled Meds: . amLODipine  5 mg Oral Daily  . antiseptic oral rinse  7 mL Mouth Rinse q12n4p  . chlorhexidine  15 mL Mouth Rinse BID  . ferrous sulfate  325 mg Oral Q breakfast  .  sodium chloride  3 mL Intravenous Q12H   Continuous Infusions: . sodium chloride 75 mL/hr at 08/22/14 1217   PRN Meds:.   Antibiotics   Anti-infectives    None        Subjective:   Sarah Perkins was seen and examined today.  Patient denies dizziness, chest pain, shortness of breath, abdominal pain, N/V/D/C, new weakness, numbess, tingling. No acute events overnight.    Objective:   Blood pressure 140/53, pulse 77, temperature 99.1 F (37.3 C), temperature source Oral, resp. rate 19, height 5\' 7"  (1.702 m), weight 63.6 kg (140 lb 3.4 oz), SpO2 93 %.  Wt Readings from Last 3 Encounters:  08/21/14 63.6 kg (140 lb 3.4 oz)  03/14/14 63.231 kg (139 lb 6.4 oz)  02/25/14 62.234 kg (137 lb 3.2 oz)     Intake/Output Summary (Last 24 hours) at 08/22/14 1219 Last data filed at 08/22/14 0830  Gross per 24 hour  Intake    612 ml  Output      0 ml  Net    612 ml  Exam  General: Alert and oriented x 3, NAD  HEENT:  PERRLA, EOMI, Anicteic Sclera, mucous membranes moist.   Neck: Supple, no JVD, no masses  CVS: S1 S2 auscultated, no rubs, murmurs or gallops. Regular rate and rhythm.  Respiratory: Clear to auscultation bilaterally, no wheezing, rales or rhonchi  Abdomen: Soft, nontender, nondistended, + bowel sounds  Ext: no cyanosis clubbing or edema  Neuro: AAOx3, Cr N's II- XII. Strength 5/5 upper and lower extremities bilaterally  Skin: No rashes  Psych: Normal affect and demeanor, alert and oriented x3    Data Review   Micro Results Recent Results (from the past 240 hour(s))  MRSA PCR Screening     Status: None   Collection Time: 08/21/14  9:03 PM  Result Value Ref Range Status   MRSA by PCR NEGATIVE NEGATIVE Final    Comment:        The GeneXpert MRSA Assay (FDA approved for NASAL specimens only), is one component of a comprehensive MRSA colonization surveillance program. It is not intended to diagnose MRSA infection nor to guide or monitor  treatment for MRSA infections.     Radiology Reports No results found.  CBC  Recent Labs Lab 08/21/14 1509 08/22/14 0015  WBC 7.8  --   HGB 11.4* 8.8*  HCT 34.6* 26.2*  PLT 245  --   MCV 92.3  --   MCH 30.4  --   MCHC 32.9  --   RDW 13.3  --   LYMPHSABS  --  2.3  MONOABS  --  1.0  EOSABS  --  0.2  BASOSABS  --  0.0    Chemistries   Recent Labs Lab 08/21/14 1509 08/22/14 0651  NA 140 138  K 3.6 3.7  CL 105 106  CO2 29 25  GLUCOSE 127* 88  BUN 11 12  CREATININE 0.72 0.68  CALCIUM 9.1 8.2*  AST 16  --   ALT 10  --   ALKPHOS 52  --   BILITOT 0.3  --    ------------------------------------------------------------------------------------------------------------------ estimated creatinine clearance is 41.8 mL/min (by C-G formula based on Cr of 0.68). ------------------------------------------------------------------------------------------------------------------ No results for input(s): HGBA1C in the last 72 hours. ------------------------------------------------------------------------------------------------------------------ No results for input(s): CHOL, HDL, LDLCALC, TRIG, CHOLHDL, LDLDIRECT in the last 72 hours. ------------------------------------------------------------------------------------------------------------------ No results for input(s): TSH, T4TOTAL, T3FREE, THYROIDAB in the last 72 hours.  Invalid input(s): FREET3 ------------------------------------------------------------------------------------------------------------------ No results for input(s): VITAMINB12, FOLATE, FERRITIN, TIBC, IRON, RETICCTPCT in the last 72 hours.  Coagulation profile No results for input(s): INR, PROTIME in the last 168 hours.  No results for input(s): DDIMER in the last 72 hours.  Cardiac Enzymes No results for input(s): CKMB, TROPONINI, MYOGLOBIN in the last 168 hours.  Invalid input(s):  CK ------------------------------------------------------------------------------------------------------------------ Invalid input(s): POCBNP  No results for input(s): GLUCAP in the last 72 hours.   Garth Diffley M.D. [facility] 08/22/2014, 12:19 PM  Pager: [phone]   Between 7am to 7pm - call Pager - [phone]  After 7pm go to [email or web address] - password [email or web address]  Call night coverage person covering after 7pm

## 2014-08-23 ENCOUNTER — Encounter (HOSPITAL_COMMUNITY): Payer: Self-pay | Admitting: General Practice

## 2014-08-23 LAB — BASIC METABOLIC PANEL
Anion gap: 8 (ref 5–15)
BUN: 13 mg/dL (ref 6–23)
CALCIUM: 8.4 mg/dL (ref 8.4–10.5)
CHLORIDE: 110 mmol/L (ref 96–112)
CO2: 22 mmol/L (ref 19–32)
CREATININE: 0.62 mg/dL (ref 0.50–1.10)
GFR calc non Af Amer: 75 mL/min — ABNORMAL LOW (ref >90)
GFR, EST AFRICAN AMERICAN: 87 mL/min — AB (ref >90)
Glucose, Bld: 89 mg/dL (ref 70–99)
POTASSIUM: 3.7 mmol/L (ref 3.5–5.1)
Sodium: 140 mmol/L (ref 135–145)

## 2014-08-23 LAB — HEMOGLOBIN AND HEMATOCRIT, BLOOD
HCT: 25.5 % — ABNORMAL LOW (ref 36.0–46.0)
HCT: 25.5 % — ABNORMAL LOW (ref 36.0–46.0)
HEMATOCRIT: 25.6 % — AB (ref 36.0–46.0)
HEMOGLOBIN: 8.5 g/dL — AB (ref 12.0–15.0)
Hemoglobin: 8.5 g/dL — ABNORMAL LOW (ref 12.0–15.0)
Hemoglobin: 8.6 g/dL — ABNORMAL LOW (ref 12.0–15.0)

## 2014-08-23 NOTE — Clinical Documentation Improvement (Signed)
08/23/14  Dear Dr. [facility]  Presents with likely recurrent diverticulosis; anemia documented in ED.   H&H has dropped from 11/ 34  to  8.8 / 26 this admission  Serial monitoring being performed  3 units typed and crossed but not given  Please clarify the likely type of anemia your patient has and document findings in next progress note and include in discharge summary if applicable.   Acute Blood Loss Anemia  Acute on chronic blood loss anemia  Chronic blood loss anemia  Other Condition________________  Cannot Clinically Determine  Thank You, Zoila Shutter ,RN Clinical Documentation Specialist:  [place] [facility]

## 2014-08-23 NOTE — Progress Notes (Signed)
[facility]                                                                              Patient Demographics  Sarah Perkins, is a 81 y.o. female, DOB - [birth year], [id number]  Admit date - 08/21/2014   Admitting Physician Etta Quill, DO  Outpatient Primary MD for the patient is Thressa Sheller, MD  LOS - 2   Chief Complaint  Patient presents with  . Rectal Bleeding       Brief HPI   Patient is a 81 y.o. female with extensive history of recurrent episodes of BRBPR presumed to be diverticular bleeds. Last admit for same was in October of last year. Patient has required blood transfusions in past.On the day of admission, patient had bright red blood in the comode x2 at home and 1 time here in ED. No anticoagulants, has mild dizziness.   Assessment & Plan    Principal Problem:   Rectal bleeding; likely recurrent diverticular bleeding, painless - Hemoglobin currently stable at 8.5, patient's son at the bedside reported some bleeding noticed on the wipe otherwise no gross rectal bleeding. He reported that she has been eating corns and coconut slices recently, possibly could have triggered her diverticular bleeding - Patient has known history of diverticulosis, conservative management for now, if gross bleeding, will need to discuss with gastroenterology - Diet advanced to soft solids today  Active Problems:   HTN (hypertension) -Currently stable, continue amlodipine     Diverticulosis - Monitor H&H, no abdominal pain to suggest diverticulitis  Code Status: Full code  Family Communication: Discussed in detail with the patient, all imaging results, lab results explained to the patient and her son at the bedside  Disposition Plan: Hopefully next 24-48 hours if remains stable   Time Spent in minutes   25 minutes  Procedures  None  Consults   None  DVT Prophylaxis   SCD's  Medications  Scheduled Meds: . amLODipine  5 mg Oral Daily   . antiseptic oral rinse  7 mL Mouth Rinse q12n4p  . chlorhexidine  15 mL Mouth Rinse BID  . ferrous sulfate  325 mg Oral Q breakfast  . sodium chloride  3 mL Intravenous Q12H   Continuous Infusions: . sodium chloride 75 mL/hr at 08/23/14 0351   PRN Meds:.   Antibiotics   Anti-infectives    None        Subjective:   Sarah Perkins was seen and examined today. No abdominal pain, per son at the bedside, had slight bleeding on the wipe otherwise no gross rectal bleeding. Patient denies dizziness, chest pain, shortness of breath, abdominal pain, N/V/D/C, new weakness, numbess, tingling. No acute events overnight.    Objective:   Blood pressure 120/46, pulse 96, temperature 98.6 F (37 C), temperature source Oral, resp. rate 20, height 5\' 7"  (1.702 m), weight 63.6 kg (140 lb 3.4 oz), SpO2 94 %.  Wt Readings from Last 3 Encounters:  08/21/14 63.6 kg (140 lb 3.4 oz)  03/14/14 63.231 kg (139 lb 6.4 oz)  02/25/14 62.234 kg (137 lb 3.2 oz)     Intake/Output Summary (Last 24 hours)  at 08/23/14 1430 Last data filed at 08/23/14 1300  Gross per 24 hour  Intake 1652.5 ml  Output      0 ml  Net 1652.5 ml    Exam  General: Alert and oriented x 3, NAD  HEENT:  PERRLA, EOMI, Anicteic Sclera  Neck: Supple, no JVD, no masses  CVS: S1 S2 clear, RRR  Respiratory: CTAB  Abdomen: Soft, NT, ND, NBS  Ext: no cyanosis clubbing or edema  Neuro: AAOx3, Cr N's II- XII. Strength 5/5 upper and lower extremities bilaterally  Skin: No rashes  Psych: Normal affect and demeanor, alert and oriented x3    Data Review   Micro Results Recent Results (from the past 240 hour(s))  MRSA PCR Screening     Status: None   Collection Time: 08/21/14  9:03 PM  Result Value Ref Range Status   MRSA by PCR NEGATIVE NEGATIVE Final    Comment:        The GeneXpert MRSA Assay (FDA approved for NASAL specimens only), is one component of a comprehensive MRSA colonization surveillance  program. It is not intended to diagnose MRSA infection nor to guide or monitor treatment for MRSA infections.     Radiology Reports No results found.  CBC  Recent Labs Lab 08/21/14 1509 08/22/14 0015 08/22/14 1054 08/22/14 2000 08/23/14 0335 08/23/14 1100  WBC 7.8  --   --   --   --   --   HGB 11.4* 8.8* 9.6* 9.6* 8.5* 8.5*  HCT 34.6* 26.2* 28.4* 29.0* 25.5* 25.6*  PLT 245  --   --   --   --   --   MCV 92.3  --   --   --   --   --   MCH 30.4  --   --   --   --   --   MCHC 32.9  --   --   --   --   --   RDW 13.3  --   --   --   --   --   LYMPHSABS  --  2.3  --   --   --   --   MONOABS  --  1.0  --   --   --   --   EOSABS  --  0.2  --   --   --   --   BASOSABS  --  0.0  --   --   --   --     Chemistries   Recent Labs Lab 08/21/14 1509 08/22/14 0651 08/23/14 0335  NA 140 138 140  K 3.6 3.7 3.7  CL 105 106 110  CO2 29 25 22   GLUCOSE 127* 88 89  BUN 11 12 13   CREATININE 0.72 0.68 0.62  CALCIUM 9.1 8.2* 8.4  AST 16  --   --   ALT 10  --   --   ALKPHOS 52  --   --   BILITOT 0.3  --   --    ------------------------------------------------------------------------------------------------------------------ estimated creatinine clearance is 41.8 mL/min (by C-G formula based on Cr of 0.62). ------------------------------------------------------------------------------------------------------------------ No results for input(s): HGBA1C in the last 72 hours. ------------------------------------------------------------------------------------------------------------------ No results for input(s): CHOL, HDL, LDLCALC, TRIG, CHOLHDL, LDLDIRECT in the last 72 hours. ------------------------------------------------------------------------------------------------------------------ No results for input(s): TSH, T4TOTAL, T3FREE, THYROIDAB in the last 72 hours.  Invalid input(s):  FREET3 ------------------------------------------------------------------------------------------------------------------ No results for input(s): VITAMINB12, FOLATE, FERRITIN, TIBC, IRON, RETICCTPCT in the last 72 hours.  Coagulation profile  No results for input(s): INR, PROTIME in the last 168 hours.  No results for input(s): DDIMER in the last 72 hours.  Cardiac Enzymes No results for input(s): CKMB, TROPONINI, MYOGLOBIN in the last 168 hours.  Invalid input(s): CK ------------------------------------------------------------------------------------------------------------------ Invalid input(s): POCBNP  No results for input(s): GLUCAP in the last 72 hours.   RAI,RIPUDEEP M.D. [facility] 08/23/2014, 2:30 PM  Pager: [phone]   Between 7am to 7pm - call Pager - [phone]  After 7pm go to [email or web address] - password [email or web address]  Call night coverage person covering after 7pm

## 2014-08-23 NOTE — Progress Notes (Signed)
Pt had a medium sized loose black stool around 1630 today.

## 2014-08-23 NOTE — Care Management Note (Signed)
    Page 1 of 1   08/24/2014     8:19:19 PM CARE MANAGEMENT NOTE 08/24/2014  Patient:  Sarah Perkins, Sarah Perkins   Account Number:  [id number]  Date Initiated:  08/23/2014  Documentation initiated by:  Tomi Bamberger  Subjective/Objective Assessment:   dx rectal bleeding  admit- lives with son, w/chair bound.     Action/Plan:   Anticipated DC Date:  08/24/2014   Anticipated DC Plan:  SKILLED NURSING FACILITY  In-house referral  Clinical Social Worker      DC Planning Services  CM consult      Choice offered to / List presented to:             Status of service:  Completed, signed off Medicare Important Message given?  YES (If response is "NO", the following Medicare IM given date fields will be blank) Date Medicare IM given:  08/23/2014 Medicare IM given by:  Tomi Bamberger Date Additional Medicare IM given:   Additional Medicare IM given by:    Discharge Disposition:  [place]  Per UR Regulation:  Reviewed for med. necessity/level of care/duration of stay  If discussed at [place] of Stay Meetings, dates discussed:    Comments:  08/23/14 [place] RN,BSN [phone] patient lives with son, patient is w/chair bound.  NCM will cont to follow for dc needs.

## 2014-08-23 NOTE — Progress Notes (Signed)
Utilization Review completed. Sieanna Vanstone RN BSN CM 

## 2014-08-24 DIAGNOSIS — D649 Anemia, unspecified: Secondary | ICD-10-CM | POA: Diagnosis not present

## 2014-08-24 DIAGNOSIS — K219 Gastro-esophageal reflux disease without esophagitis: Secondary | ICD-10-CM | POA: Diagnosis not present

## 2014-08-24 DIAGNOSIS — R279 Unspecified lack of coordination: Secondary | ICD-10-CM | POA: Diagnosis not present

## 2014-08-24 DIAGNOSIS — K5793 Diverticulitis of intestine, part unspecified, without perforation or abscess with bleeding: Secondary | ICD-10-CM | POA: Diagnosis not present

## 2014-08-24 DIAGNOSIS — R600 Localized edema: Secondary | ICD-10-CM | POA: Diagnosis not present

## 2014-08-24 DIAGNOSIS — I671 Cerebral aneurysm, nonruptured: Secondary | ICD-10-CM | POA: Diagnosis not present

## 2014-08-24 DIAGNOSIS — M6281 Muscle weakness (generalized): Secondary | ICD-10-CM | POA: Diagnosis not present

## 2014-08-24 DIAGNOSIS — I10 Essential (primary) hypertension: Secondary | ICD-10-CM | POA: Diagnosis not present

## 2014-08-24 DIAGNOSIS — K5792 Diverticulitis of intestine, part unspecified, without perforation or abscess without bleeding: Secondary | ICD-10-CM | POA: Diagnosis not present

## 2014-08-24 DIAGNOSIS — G4700 Insomnia, unspecified: Secondary | ICD-10-CM | POA: Diagnosis not present

## 2014-08-24 DIAGNOSIS — N390 Urinary tract infection, site not specified: Secondary | ICD-10-CM | POA: Diagnosis not present

## 2014-08-24 DIAGNOSIS — R0602 Shortness of breath: Secondary | ICD-10-CM | POA: Diagnosis not present

## 2014-08-24 DIAGNOSIS — M79606 Pain in leg, unspecified: Secondary | ICD-10-CM | POA: Diagnosis not present

## 2014-08-24 DIAGNOSIS — N189 Chronic kidney disease, unspecified: Secondary | ICD-10-CM | POA: Diagnosis not present

## 2014-08-24 DIAGNOSIS — K5900 Constipation, unspecified: Secondary | ICD-10-CM | POA: Diagnosis not present

## 2014-08-24 DIAGNOSIS — S80269A Insect bite (nonvenomous), unspecified knee, initial encounter: Secondary | ICD-10-CM | POA: Diagnosis not present

## 2014-08-24 DIAGNOSIS — R251 Tremor, unspecified: Secondary | ICD-10-CM | POA: Diagnosis not present

## 2014-08-24 DIAGNOSIS — I509 Heart failure, unspecified: Secondary | ICD-10-CM | POA: Diagnosis not present

## 2014-08-24 DIAGNOSIS — K625 Hemorrhage of anus and rectum: Secondary | ICD-10-CM | POA: Diagnosis not present

## 2014-08-24 DIAGNOSIS — I8310 Varicose veins of unspecified lower extremity with inflammation: Secondary | ICD-10-CM | POA: Diagnosis not present

## 2014-08-24 DIAGNOSIS — D509 Iron deficiency anemia, unspecified: Secondary | ICD-10-CM | POA: Diagnosis not present

## 2014-08-24 DIAGNOSIS — F0390 Unspecified dementia without behavioral disturbance: Secondary | ICD-10-CM | POA: Diagnosis not present

## 2014-08-24 DIAGNOSIS — K922 Gastrointestinal hemorrhage, unspecified: Secondary | ICD-10-CM | POA: Diagnosis not present

## 2014-08-24 LAB — BASIC METABOLIC PANEL
Anion gap: 6 (ref 5–15)
BUN: 13 mg/dL (ref 6–23)
CHLORIDE: 104 mmol/L (ref 96–112)
CO2: 24 mmol/L (ref 19–32)
CREATININE: 0.94 mg/dL (ref 0.50–1.10)
Calcium: 8.4 mg/dL (ref 8.4–10.5)
GFR calc non Af Amer: 50 mL/min — ABNORMAL LOW (ref >90)
GFR, EST AFRICAN AMERICAN: 58 mL/min — AB (ref >90)
GLUCOSE: 142 mg/dL — AB (ref 70–99)
Potassium: 4.1 mmol/L (ref 3.5–5.1)
Sodium: 134 mmol/L — ABNORMAL LOW (ref 135–145)

## 2014-08-24 LAB — HEMOGLOBIN AND HEMATOCRIT, BLOOD
HCT: 38.1 % (ref 36.0–46.0)
HEMATOCRIT: 27.7 % — AB (ref 36.0–46.0)
HEMOGLOBIN: 12.2 g/dL (ref 12.0–15.0)
Hemoglobin: 9.3 g/dL — ABNORMAL LOW (ref 12.0–15.0)

## 2014-08-24 NOTE — Discharge Summary (Addendum)
Physician Discharge Summary  Sarah Perkins LOV:[id number] DOB: 1917/11/28 DOA: 08/21/2014  PCP: Thressa Sheller, MD  Admit date: 08/21/2014 Discharge date: 08/24/2014  Time spent: 35 minutes  Recommendations for Outpatient Follow-up:  1. To SNF  Discharge Diagnoses:  Principal Problem:   Rectal bleeding Active Problems:   HTN (hypertension)   Diverticulosis   Discharge Condition: improved  Diet recommendation: soft/bland  Filed Weights   08/21/14 2050  Weight: 63.6 kg (140 lb 3.4 oz)    History of present illness:  Sarah Perkins is a 81 y.o. female with extensive history of recurrent episodes of BRBPR presumed to be diverticular bleeds. Last admit for same was in October of last year. Patient has required blood transfusions in past. Today patient had bright red blood in the comode x2 at home and 1 time here in ED. No anticoagulants, has mild dizziness.  Hospital Course:  Rectal bleeding; likely recurrent diverticular bleeding, painless - Hemoglobin currently improved - Patient has known history of diverticulosis, conservative management for now, if gross bleeding, will need to discuss with gastroenterology - Diet advanced to soft solids today   HTN (hypertension) -Currently stable, continue amlodipine   Diverticulosis - stable,  patient eating  ABLA from rectal bleeding  Procedures:    Consultations:    Discharge Exam: Filed Vitals:   08/24/14 0557  BP: 155/58  Pulse: 74  Temp: 97.9 F (36.6 C)  Resp: 20    General: pleasant/cooperative Cardiovascular: rrr Respiratory: clear  Discharge Instructions   Discharge Instructions    Discharge instructions    Complete by:  As directed   Soft diet Cbc 1 week     Increase activity slowly    Complete by:  As directed           Current Discharge Medication List    CONTINUE these medications which have NOT CHANGED   Details  amLODipine (NORVASC) 5 MG tablet Take 5 mg by mouth  daily.    Ascorbic Acid (VITAMIN C) 100 MG tablet Take 100 mg by mouth daily. With rose hips 500 mg    cholecalciferol (VITAMIN D) 1000 UNITS tablet Take 1,000 Units by mouth daily.    docusate sodium (COLACE) 100 MG capsule Take 100 mg by mouth at bedtime.    ferrous sulfate 325 (65 FE) MG tablet Take 325 mg by mouth daily with breakfast.    furosemide (LASIX) 20 MG tablet Take 10 mg by mouth daily.     Multiple Minerals (CALCIUM-MAGNESIUM-ZINC) TABS Take 1 tablet by mouth daily.       No Known Allergies Follow-up Information    Follow up with Thressa Sheller, MD In 1 week.   Specialty:  Internal Medicine   Why:  cbc   Contact information:   [address] [place] [address] [phone]        The results of significant diagnostics from this hospitalization (including imaging, microbiology, ancillary and laboratory) are listed below for reference.    Significant Diagnostic Studies: No results found.  Microbiology: Recent Results (from the past 240 hour(s))  MRSA PCR Screening     Status: None   Collection Time: 08/21/14  9:03 PM  Result Value Ref Range Status   MRSA by PCR NEGATIVE NEGATIVE Final    Comment:        The GeneXpert MRSA Assay (FDA approved for NASAL specimens only), is one component of a comprehensive MRSA colonization surveillance program. It is not intended to diagnose MRSA infection nor to guide or  monitor treatment for MRSA infections.      Labs: Basic Metabolic Panel:  Recent Labs Lab 08/21/14 1509 08/22/14 0651 08/23/14 0335 08/24/14 0309  NA 140 138 140 134*  K 3.6 3.7 3.7 4.1  CL 105 106 110 104  CO2 29 25 22 24   GLUCOSE 127* 88 89 142*  BUN 11 12 13 13   CREATININE 0.72 0.68 0.62 0.94  CALCIUM 9.1 8.2* 8.4 8.4   Liver Function Tests:  Recent Labs Lab 08/21/14 1509  AST 16  ALT 10  ALKPHOS 52  BILITOT 0.3  PROT 8.3  ALBUMIN 2.8*   No results for input(s): LIPASE, AMYLASE in the last 168  hours. No results for input(s): AMMONIA in the last 168 hours. CBC:  Recent Labs Lab 08/21/14 1509 08/22/14 0015  08/22/14 2000 08/23/14 0335 08/23/14 1100 08/23/14 1917 08/24/14 0309  WBC 7.8  --   --   --   --   --   --   --   NEUTROABS  --  4.4  --   --   --   --   --   --   HGB 11.4* 8.8*  < > 9.6* 8.5* 8.5* 8.6* 12.2  HCT 34.6* 26.2*  < > 29.0* 25.5* 25.6* 25.5* 38.1  MCV 92.3  --   --   --   --   --   --   --   PLT 245  --   --   --   --   --   --   --   < > = values in this interval not displayed. Cardiac Enzymes: No results for input(s): CKTOTAL, CKMB, CKMBINDEX, TROPONINI in the last 168 hours. BNP: BNP (last 3 results) No results for input(s): BNP in the last 8760 hours.  ProBNP (last 3 results) No results for input(s): PROBNP in the last 8760 hours.  CBG: No results for input(s): GLUCAP in the last 168 hours.     SignedEulogio Bear  [facility] 08/24/2014, 8:35 AM

## 2014-08-24 NOTE — Progress Notes (Signed)
Patient was discharged to nursing home [facility])  by MD order; discharged instructions  review and sent to facility with care notes ; IV DIC; skin intact; facility was called and reported was given to DON; patient will be transported to facility via EMS.

## 2014-08-24 NOTE — Clinical Social Work Placement (Signed)
   CLINICAL SOCIAL WORK PLACEMENT  NOTE  Date:  08/24/2014  Patient Details  Name: Sarah Perkins MRN: [id number] Date of Birth: [birth year]  Clinical Social Work is seeking post-discharge placement for this patient at the [place] level of care (*CSW will initial, date and re-position this form in  chart as items are completed):  Yes   Patient/family provided with [place] Work Department's list of facilities offering this level of care within the geographic area requested by the patient (or if unable, by the patient's family).  Yes   Patient/family informed of their freedom to choose among providers that offer the needed level of care, that participate in Medicare, Medicaid or managed care program needed by the patient, have an available bed and are willing to accept the patient.  Yes   Patient/family informed of [facility]'s ownership interest in [facility] and [facility], as well as of the fact that they are under no obligation to receive care at these facilities.  PASRR submitted to EDS on       PASRR number received on       Existing PASRR number confirmed on 08/22/14     FL2 transmitted to all facilities in geographic area requested by pt/family on       FL2 transmitted to all facilities within larger geographic area on 08/22/14     Patient informed that his/her managed care company has contracts with or will negotiate with certain facilities, including the following:        Yes   Patient/family informed of bed offers received.  Patient chooses bed at [facility]     Physician recommends and patient chooses bed at      Patient to be transferred to [facility] on 08/24/14.  Patient to be transferred to facility by Ambulance     Patient family notified on 08/24/14 of transfer.  Name of family member notified:  [name]     PHYSICIAN       Additional Comment:   Per MD patient ready for DC to [facility]. RN, patient,  patient's family, and facility notified of DC. RN given number for report. DC packet on chart. Ambulance transport requested for patient for 3:00PM. CSW signing off.  _______________________________________________ Rigoberto Noel, LCSW 08/24/2014, 2:54 PM

## 2014-08-24 NOTE — Evaluation (Signed)
Physical Therapy Evaluation Patient Details Name: Sarah Perkins MRN: [id number] DOB: 29-Jul-1917 Today's Date: 08/24/2014   History of Present Illness  Sarah Perkins is a 81 y.o. female with extensive history of recurrent episodes of BRBPR presumed to be diverticular bleeds. Last admit for same was in October of last year. Patient has required blood transfusions in past. Today patient had bright red blood in the comode x2 at home and 1 time here in ED. No anticoagulants, has mild dizziness.  Clinical Impression  Pt admitted with above diagnosis. Pt currently with functional limitations due to the deficits listed below (see PT Problem List). Pt will benefit from PT at [facility] to improve mobility prior to d/c home.  Son has been struggling to help pt.  Will follow acutely.  Pt will benefit from skilled PT to increase their independence and safety with mobility to allow discharge to the venue listed below.      Follow Up Recommendations SNF;Supervision/Assistance - 24 hour    Equipment Recommendations  None recommended by PT    Recommendations for Other Services       Precautions / Restrictions Precautions Precautions: Fall Restrictions Weight Bearing Restrictions: No      Mobility  Bed Mobility Overal bed mobility: Needs Assistance;+2 for physical assistance Bed Mobility: Rolling;Sidelying to Sit Rolling: Mod assist;+2 for physical assistance Sidelying to sit: Mod assist;+2 for physical assistance       General bed mobility comments: Pt was able to reach for rail and assist with rolling as well as side to sit however still needed mod assist and use of bed pad.  Son states that he uses the bed pad at home too.    Transfers Overall transfer level: Needs assistance Equipment used: 2 person hand held assist Transfers: Sit to/from [facility] Sit to Stand: Mod assist;Max assist;+2 physical assistance Stand pivot transfers: Mod assist;Max assist;+2  physical assistance       General transfer comment: Stood pt 3x as pt soiled upon standing.  Pt continued to have BM and NT came in and cleaned pt while PT standing pt.  Pt with significant posterior lean needing mod to max assist to stand upright.  Took 2 persons for stand pivot transfer to recliner with mod to max assist due to posterior lean.  Son states that is really why she has become too hard to manage at home and he hopes she can improve with postural stability.    Ambulation/Gait                Stairs            Wheelchair Mobility    Modified Rankin (Stroke Patients Only)       Balance Overall balance assessment: Needs assistance;History of Falls Sitting-balance support: No upper extremity supported;Feet supported Sitting balance-Leahy Scale: Fair Sitting balance - Comments: can sit for 10 min without UE support at EOB.  Still with posterior lean but not falling backwards and able to sit up.   Postural control: Posterior lean Standing balance support: Bilateral upper extremity supported;During functional activity Standing balance-Leahy Scale: Zero Standing balance comment: Needs mod to max asssit for balance with all weight on pts heels with posterior lean significant.                               Pertinent Vitals/Pain Pain Assessment: Faces Faces Pain Scale: Hurts even more Pain Location: all over Pain Descriptors /  Indicators: Aching;Sore Pain Intervention(s): Limited activity within patient's tolerance;Monitored during session;Repositioned  VSS    Home Living Family/patient expects to be discharged to:: Skilled nursing facility Living Arrangements: Children Available Help at Discharge: Family;Available 24 hours/day Type of Home: House Home Access: Level entry     Home Layout: One level Home Equipment: Bedside commode;Toilet riser;Wheelchair - Insurance claims handler - 2 wheels;Walker - 4 wheels Additional Comments: Has been living  with son but he states she has gotten too hard to take care of.      Prior Function Level of Independence: Needs assistance   Gait / Transfers Assistance Needed: Mainly uses wheelchair for mobility.  Uses RW walking 3x week per son however needs mod assist per son to ambulate 15 feet due to posterior lean.    ADL's / Homemaking Assistance Needed: Pt fed herself, combed her hair, UB dressing, with assist to sponge bathe with occasional shower with assist.          Hand Dominance   Dominant Hand: Right    Extremity/Trunk Assessment   Upper Extremity Assessment: Defer to OT evaluation           Lower Extremity Assessment: RLE deficits/detail;LLE deficits/detail RLE Deficits / Details: grossly 3-/5 LLE Deficits / Details: grossly 3-/5  Cervical / Trunk Assessment: Kyphotic  Communication   Communication: HOH  Cognition Arousal/Alertness: Awake/alert Behavior During Therapy: WFL for tasks assessed/performed Overall Cognitive Status: History of cognitive impairments - at baseline       Memory: Decreased short-term memory              General Comments      Exercises General Exercises - Upper Extremity Shoulder Flexion: AAROM;Both;10 reps;Seated Elbow Flexion: AROM;Both;10 reps;Seated Elbow Extension: AROM;Both;10 reps;Seated General Exercises - Lower Extremity Ankle Circles/Pumps: AROM;Both;10 reps;Seated Long Arc Quad: AROM;Both;10 reps;Seated Hip Flexion/Marching: AROM;Both;10 reps;Seated      Assessment/Plan    PT Assessment Patient needs continued PT services  PT Diagnosis Generalized weakness   PT Problem List Decreased mobility;Decreased activity tolerance;Decreased balance;Decreased strength;Decreased range of motion;Decreased knowledge of use of DME;Decreased safety awareness;Decreased knowledge of precautions;Decreased cognition;Pain  PT Treatment Interventions DME instruction;Gait training;Functional mobility training;Therapeutic  activities;Therapeutic exercise;Balance training;Patient/family education   PT Goals (Current goals can be found in the Care Plan section) Acute Rehab PT Goals Patient Stated Goal: to go get therapy PT Goal Formulation: With patient Time For Goal Achievement: 09/06/14 Potential to Achieve Goals: Good    Frequency Min 2X/week   Barriers to discharge        Co-evaluation               End of Session Equipment Utilized During Treatment: Gait belt Activity Tolerance: Patient limited by fatigue Patient left: in chair;with call bell/phone within reach;with family/visitor present;with chair alarm set Nurse Communication: Mobility status;Need for lift equipment (recommend [facility] for transfers)         Time: [phone] PT Time Calculation (min) (ACUTE ONLY): 41 min   Charges:   PT Evaluation $Initial PT Evaluation Tier I: 1 Procedure PT Treatments $Therapeutic Exercise: 8-22 mins $Therapeutic Activity: 8-22 mins   PT G CodesIrwin Brakeman F 25-Aug-2014, 11:03 AM [facility] Acute Rehabilitation [phone] (pager)

## 2014-08-25 DIAGNOSIS — F0390 Unspecified dementia without behavioral disturbance: Secondary | ICD-10-CM | POA: Diagnosis not present

## 2014-08-25 DIAGNOSIS — I509 Heart failure, unspecified: Secondary | ICD-10-CM | POA: Diagnosis not present

## 2014-08-25 DIAGNOSIS — K922 Gastrointestinal hemorrhage, unspecified: Secondary | ICD-10-CM | POA: Diagnosis not present

## 2014-08-25 DIAGNOSIS — K5792 Diverticulitis of intestine, part unspecified, without perforation or abscess without bleeding: Secondary | ICD-10-CM | POA: Diagnosis not present

## 2014-08-25 LAB — TYPE AND SCREEN
ABO/RH(D): B POS
Antibody Screen: POSITIVE
DAT, IgG: NEGATIVE
Donor AG Type: NEGATIVE
Donor AG Type: NEGATIVE
Donor AG Type: NEGATIVE
PT AG TYPE: NEGATIVE
Unit division: 0
Unit division: 0
Unit division: 0

## 2014-08-31 DIAGNOSIS — R600 Localized edema: Secondary | ICD-10-CM | POA: Diagnosis not present

## 2014-08-31 DIAGNOSIS — I8310 Varicose veins of unspecified lower extremity with inflammation: Secondary | ICD-10-CM | POA: Diagnosis not present

## 2014-08-31 DIAGNOSIS — I509 Heart failure, unspecified: Secondary | ICD-10-CM | POA: Diagnosis not present

## 2014-09-12 DIAGNOSIS — G4700 Insomnia, unspecified: Secondary | ICD-10-CM | POA: Diagnosis not present

## 2014-09-20 DIAGNOSIS — R600 Localized edema: Secondary | ICD-10-CM | POA: Diagnosis not present

## 2014-09-20 DIAGNOSIS — M79606 Pain in leg, unspecified: Secondary | ICD-10-CM | POA: Diagnosis not present

## 2014-09-24 DIAGNOSIS — R269 Unspecified abnormalities of gait and mobility: Secondary | ICD-10-CM | POA: Diagnosis not present

## 2014-09-24 DIAGNOSIS — F0390 Unspecified dementia without behavioral disturbance: Secondary | ICD-10-CM | POA: Diagnosis not present

## 2014-09-24 DIAGNOSIS — R251 Tremor, unspecified: Secondary | ICD-10-CM | POA: Diagnosis not present

## 2014-09-24 DIAGNOSIS — K625 Hemorrhage of anus and rectum: Secondary | ICD-10-CM | POA: Diagnosis not present

## 2014-09-28 DIAGNOSIS — R269 Unspecified abnormalities of gait and mobility: Secondary | ICD-10-CM | POA: Diagnosis not present

## 2014-09-28 DIAGNOSIS — K625 Hemorrhage of anus and rectum: Secondary | ICD-10-CM | POA: Diagnosis not present

## 2014-10-01 DIAGNOSIS — R269 Unspecified abnormalities of gait and mobility: Secondary | ICD-10-CM | POA: Diagnosis not present

## 2014-10-01 DIAGNOSIS — K625 Hemorrhage of anus and rectum: Secondary | ICD-10-CM | POA: Diagnosis not present

## 2014-10-04 DIAGNOSIS — R269 Unspecified abnormalities of gait and mobility: Secondary | ICD-10-CM | POA: Diagnosis not present

## 2014-10-04 DIAGNOSIS — K625 Hemorrhage of anus and rectum: Secondary | ICD-10-CM | POA: Diagnosis not present

## 2014-10-08 ENCOUNTER — Inpatient Hospital Stay (HOSPITAL_COMMUNITY)
Admission: EM | Admit: 2014-10-08 | Discharge: 2014-10-11 | DRG: 378 | Disposition: A | Discharge status: Skilled Nursing Facility | Payer: Medicare Other | Attending: Internal Medicine | Admitting: Internal Medicine

## 2014-10-08 ENCOUNTER — Encounter (HOSPITAL_COMMUNITY): Payer: Self-pay | Admitting: Emergency Medicine

## 2014-10-08 ENCOUNTER — Emergency Department (HOSPITAL_COMMUNITY): Payer: Medicare Other

## 2014-10-08 DIAGNOSIS — K219 Gastro-esophageal reflux disease without esophagitis: Secondary | ICD-10-CM | POA: Diagnosis present

## 2014-10-08 DIAGNOSIS — R32 Unspecified urinary incontinence: Secondary | ICD-10-CM | POA: Diagnosis present

## 2014-10-08 DIAGNOSIS — K5791 Diverticulosis of intestine, part unspecified, without perforation or abscess with bleeding: Principal | ICD-10-CM | POA: Diagnosis present

## 2014-10-08 DIAGNOSIS — R05 Cough: Secondary | ICD-10-CM

## 2014-10-08 DIAGNOSIS — N189 Chronic kidney disease, unspecified: Secondary | ICD-10-CM

## 2014-10-08 DIAGNOSIS — I509 Heart failure, unspecified: Secondary | ICD-10-CM | POA: Diagnosis present

## 2014-10-08 DIAGNOSIS — G4700 Insomnia, unspecified: Secondary | ICD-10-CM | POA: Diagnosis not present

## 2014-10-08 DIAGNOSIS — F0390 Unspecified dementia without behavioral disturbance: Secondary | ICD-10-CM | POA: Diagnosis present

## 2014-10-08 DIAGNOSIS — K5900 Constipation, unspecified: Secondary | ICD-10-CM | POA: Diagnosis present

## 2014-10-08 DIAGNOSIS — R627 Adult failure to thrive: Secondary | ICD-10-CM | POA: Diagnosis present

## 2014-10-08 DIAGNOSIS — R1010 Upper abdominal pain, unspecified: Secondary | ICD-10-CM | POA: Diagnosis not present

## 2014-10-08 DIAGNOSIS — K922 Gastrointestinal hemorrhage, unspecified: Secondary | ICD-10-CM | POA: Diagnosis present

## 2014-10-08 DIAGNOSIS — I5032 Chronic diastolic (congestive) heart failure: Secondary | ICD-10-CM | POA: Diagnosis not present

## 2014-10-08 DIAGNOSIS — K921 Melena: Secondary | ICD-10-CM | POA: Diagnosis not present

## 2014-10-08 DIAGNOSIS — J209 Acute bronchitis, unspecified: Secondary | ICD-10-CM | POA: Diagnosis present

## 2014-10-08 DIAGNOSIS — Z515 Encounter for palliative care: Secondary | ICD-10-CM | POA: Insufficient documentation

## 2014-10-08 DIAGNOSIS — D62 Acute posthemorrhagic anemia: Secondary | ICD-10-CM | POA: Diagnosis not present

## 2014-10-08 DIAGNOSIS — K5790 Diverticulosis of intestine, part unspecified, without perforation or abscess without bleeding: Secondary | ICD-10-CM | POA: Diagnosis present

## 2014-10-08 DIAGNOSIS — D509 Iron deficiency anemia, unspecified: Secondary | ICD-10-CM | POA: Diagnosis present

## 2014-10-08 DIAGNOSIS — I129 Hypertensive chronic kidney disease with stage 1 through stage 4 chronic kidney disease, or unspecified chronic kidney disease: Secondary | ICD-10-CM | POA: Diagnosis present

## 2014-10-08 DIAGNOSIS — R059 Cough, unspecified: Secondary | ICD-10-CM | POA: Insufficient documentation

## 2014-10-08 DIAGNOSIS — R5383 Other fatigue: Secondary | ICD-10-CM | POA: Diagnosis not present

## 2014-10-08 DIAGNOSIS — R278 Other lack of coordination: Secondary | ICD-10-CM | POA: Diagnosis not present

## 2014-10-08 DIAGNOSIS — I10 Essential (primary) hypertension: Secondary | ICD-10-CM | POA: Diagnosis not present

## 2014-10-08 DIAGNOSIS — D649 Anemia, unspecified: Secondary | ICD-10-CM | POA: Diagnosis not present

## 2014-10-08 DIAGNOSIS — N181 Chronic kidney disease, stage 1: Secondary | ICD-10-CM | POA: Diagnosis not present

## 2014-10-08 DIAGNOSIS — E568 Deficiency of other vitamins: Secondary | ICD-10-CM | POA: Diagnosis not present

## 2014-10-08 DIAGNOSIS — M6281 Muscle weakness (generalized): Secondary | ICD-10-CM | POA: Diagnosis not present

## 2014-10-08 DIAGNOSIS — K5731 Diverticulosis of large intestine without perforation or abscess with bleeding: Secondary | ICD-10-CM | POA: Diagnosis not present

## 2014-10-08 DIAGNOSIS — G8929 Other chronic pain: Secondary | ICD-10-CM | POA: Diagnosis not present

## 2014-10-08 DIAGNOSIS — N179 Acute kidney failure, unspecified: Secondary | ICD-10-CM | POA: Diagnosis present

## 2014-10-08 LAB — COMPREHENSIVE METABOLIC PANEL
ALBUMIN: 3 g/dL — AB (ref 3.5–5.0)
ALK PHOS: 46 U/L (ref 38–126)
ALT: 10 U/L — AB (ref 14–54)
ALT: 8 U/L — ABNORMAL LOW (ref 14–54)
ANION GAP: 12 (ref 5–15)
AST: 31 U/L (ref 15–41)
AST: 39 U/L (ref 15–41)
Albumin: 3.2 g/dL — ABNORMAL LOW (ref 3.5–5.0)
Alkaline Phosphatase: 46 U/L (ref 38–126)
Anion gap: 9 (ref 5–15)
BILIRUBIN TOTAL: 0.9 mg/dL (ref 0.3–1.2)
BUN: 18 mg/dL (ref 6–20)
BUN: 19 mg/dL (ref 6–20)
CHLORIDE: 101 mmol/L (ref 101–111)
CO2: 25 mmol/L (ref 22–32)
CO2: 25 mmol/L (ref 22–32)
CREATININE: 0.74 mg/dL (ref 0.44–1.00)
Calcium: 9.5 mg/dL (ref 8.9–10.3)
Calcium: 9.5 mg/dL (ref 8.9–10.3)
Chloride: 101 mmol/L (ref 101–111)
Creatinine, Ser: 0.72 mg/dL (ref 0.44–1.00)
GFR calc Af Amer: >60 mL/min (ref >60)
GFR calc Af Amer: >60 mL/min (ref >60)
GFR calc non Af Amer: >60 mL/min (ref >60)
GFR calc non Af Amer: >60 mL/min (ref >60)
GLUCOSE: 112 mg/dL — AB (ref 65–99)
Glucose, Bld: 106 mg/dL — ABNORMAL HIGH (ref 65–99)
POTASSIUM: 4.2 mmol/L (ref 3.5–5.1)
Potassium: 4.7 mmol/L (ref 3.5–5.1)
SODIUM: 138 mmol/L (ref 135–145)
SODIUM: 135 mmol/L (ref 135–145)
TOTAL PROTEIN: 8 g/dL (ref 6.5–8.1)
Total Bilirubin: 1.2 mg/dL (ref 0.3–1.2)
Total Protein: 8.3 g/dL — ABNORMAL HIGH (ref 6.5–8.1)

## 2014-10-08 LAB — URINALYSIS, ROUTINE W REFLEX MICROSCOPIC
Bilirubin Urine: NEGATIVE
Glucose, UA: NEGATIVE mg/dL
Ketones, ur: NEGATIVE mg/dL
LEUKOCYTES UA: NEGATIVE
NITRITE: NEGATIVE
PROTEIN: NEGATIVE mg/dL
Specific Gravity, Urine: 1.012 (ref 1.005–1.030)
Urobilinogen, UA: 0.2 mg/dL (ref 0.0–1.0)
pH: 8.5 — ABNORMAL HIGH (ref 5.0–8.0)

## 2014-10-08 LAB — DIFFERENTIAL
Basophils Absolute: 0.1 10*3/uL (ref 0.0–0.1)
Basophils Relative: 1 % (ref 0–1)
Eosinophils Absolute: 0.1 10*3/uL (ref 0.0–0.7)
Eosinophils Relative: 1 % (ref 0–5)
LYMPHS ABS: 2.2 10*3/uL (ref 0.7–4.0)
Lymphocytes Relative: 21 % (ref 12–46)
Monocytes Absolute: 1.1 10*3/uL — ABNORMAL HIGH (ref 0.1–1.0)
Monocytes Relative: 11 % (ref 3–12)
NEUTROS ABS: 7.1 10*3/uL (ref 1.7–7.7)
NEUTROS PCT: 66 % (ref 43–77)

## 2014-10-08 LAB — URINE MICROSCOPIC-ADD ON

## 2014-10-08 LAB — LIPASE, BLOOD: Lipase: 11 U/L — ABNORMAL LOW (ref 22–51)

## 2014-10-08 LAB — BRAIN NATRIURETIC PEPTIDE: B Natriuretic Peptide: 98.5 pg/mL (ref 0.0–100.0)

## 2014-10-08 LAB — CBC
HEMATOCRIT: 27 % — AB (ref 36.0–46.0)
Hemoglobin: 9 g/dL — ABNORMAL LOW (ref 12.0–15.0)
MCH: 30 pg (ref 26.0–34.0)
MCHC: 33.3 g/dL (ref 30.0–36.0)
MCV: 90 fL (ref 78.0–100.0)
Platelets: 275 10*3/uL (ref 150–400)
RBC: 3 MIL/uL — AB (ref 3.87–5.11)
RDW: 13.4 % (ref 11.5–15.5)
WBC: 10.6 10*3/uL — ABNORMAL HIGH (ref 4.0–10.5)

## 2014-10-08 LAB — I-STAT CG4 LACTIC ACID, ED: Lactic Acid, Venous: 2.01 mmol/L (ref 0.5–2.0)

## 2014-10-08 LAB — LACTIC ACID, PLASMA: Lactic Acid, Venous: 1.1 mmol/L (ref 0.5–2.0)

## 2014-10-08 LAB — I-STAT TROPONIN, ED: Troponin i, poc: 0 ng/mL (ref 0.00–0.08)

## 2014-10-08 LAB — POC OCCULT BLOOD, ED: Fecal Occult Bld: POSITIVE — AB

## 2014-10-08 MED ORDER — SODIUM CHLORIDE 0.9 % IJ SOLN
3.0000 mL | Freq: Two times a day (BID) | INTRAMUSCULAR | Status: DC
  Administered 2014-10-08 – 2014-10-11 (×6): 3 mL via INTRAVENOUS

## 2014-10-08 MED ORDER — ACETAMINOPHEN 325 MG PO TABS
650.0000 mg | ORAL_TABLET | Freq: Four times a day (QID) | ORAL | Status: DC | PRN
  Administered 2014-10-10: 650 mg via ORAL
  Filled 2014-10-08: qty 2

## 2014-10-08 MED ORDER — ONDANSETRON HCL 4 MG PO TABS: 4.0000 mg | ORAL_TABLET | Freq: Four times a day (QID) | ORAL | Status: DC | PRN

## 2014-10-08 MED ORDER — ONDANSETRON HCL 4 MG/2ML IJ SOLN
4.0000 mg | Freq: Four times a day (QID) | INTRAMUSCULAR | Status: DC | PRN
  Administered 2014-10-10: 4 mg via INTRAVENOUS
  Filled 2014-10-08: qty 2

## 2014-10-08 MED ORDER — THIAMINE HCL 100 MG/ML IJ SOLN
100.0000 mg | Freq: Every day | INTRAMUSCULAR | Status: DC
  Administered 2014-10-08 – 2014-10-09 (×2): 100 mg via INTRAVENOUS
  Filled 2014-10-08 (×3): qty 1

## 2014-10-08 MED ORDER — FERROUS SULFATE 325 (65 FE) MG PO TABS
325.0000 mg | ORAL_TABLET | Freq: Every day | ORAL | Status: DC
  Filled 2014-10-08: qty 1

## 2014-10-08 MED ORDER — AMLODIPINE BESYLATE 5 MG PO TABS
5.0000 mg | ORAL_TABLET | Freq: Every day | ORAL | Status: DC
  Administered 2014-10-08 – 2014-10-11 (×4): 5 mg via ORAL
  Filled 2014-10-08 (×4): qty 1

## 2014-10-08 MED ORDER — LEVOFLOXACIN IN D5W 500 MG/100ML IV SOLN
500.0000 mg | INTRAVENOUS | Status: DC
  Administered 2014-10-08: 500 mg via INTRAVENOUS
  Filled 2014-10-08 (×2): qty 100

## 2014-10-08 MED ORDER — FOLIC ACID 5 MG/ML IJ SOLN
1.0000 mg | Freq: Every day | INTRAMUSCULAR | Status: DC
  Administered 2014-10-08: 1 mg via INTRAVENOUS
  Filled 2014-10-08 (×3): qty 0.2

## 2014-10-08 MED ORDER — ALUM & MAG HYDROXIDE-SIMETH 200-200-20 MG/5ML PO SUSP: 30.0000 mL | Freq: Four times a day (QID) | ORAL | Status: DC | PRN

## 2014-10-08 MED ORDER — FERROUS SULFATE 325 (65 FE) MG PO TABS
325.0000 mg | ORAL_TABLET | Freq: Every day | ORAL | Status: DC
  Administered 2014-10-09 – 2014-10-11 (×3): 325 mg via ORAL
  Filled 2014-10-08 (×4): qty 1

## 2014-10-08 MED ORDER — ACETAMINOPHEN 650 MG RE SUPP
650.0000 mg | Freq: Four times a day (QID) | RECTAL | Status: DC | PRN
Start: 2014-10-08 — End: 2014-10-11

## 2014-10-08 NOTE — ED Provider Notes (Signed)
CSN: [id number]     Arrival date & time 10/08/14  2536 History   First MD Initiated Contact with Patient 10/08/14 [phone]     Chief Complaint  Patient presents with  . Abdominal Pain  . Cough  . Blood In Stools     (Consider location/radiation/quality/duration/timing/severity/associated sxs/prior Treatment) HPI Otto Caraway is a 81 year old female past medical history of CHF, hypertension, GERD, diverticular bleed, diverticulitis who presents the ER with her son with multiple complaints. Patient's son reports patient has been experiencing reductive cough for the past 3-4 days. He reports noticing blood in her stools yesterday, and decreased by mouth intake over the past several days as well. Patient's son reports patient has been expressing worsening of her urinary incontinence yesterday and today. Patient reports feeling generalized weakness for the past several days as well. Patient's son states he is concerned because patient has required multiple blood infusions in the past due to her diverticular bleeds. Patient denies any shortness of breath, chest pain, nausea, vomiting, abdominal pain, dysuria, headache, blurred vision, dizziness.  Past Medical History  Diagnosis Date  . CHF (congestive heart failure)   . Hypertension   . Aneurysm 1972    cerebral  . Acid reflux     occasional  . Tremor     worse on right arm  . History of GI diverticular bleed 06/2009    colonoscopy with endo clipping of bleeding tic by Dr Clarene Essex.  Flex sig by Dr Paulita Fujita.  . Acute blood loss anemia 06/2009    received ~ 6 units blood  . Adenomatous polyp of colon 06/2009  . Diverticulitis   . UTI (lower urinary tract infection)   . GI bleed 02/14/2014   Past Surgical History  Procedure Laterality Date  . Hip fracture surgery  2011  . Colonoscopy w/ control of hemorrhage  06/2009  . Knee arthroscopy    . Cerebral aneurysm repair  1970's  . Colonoscopy  09/22/2011    Procedure: COLONOSCOPY;  Surgeon:  Juanita Craver, MD;  Location: [facility] ENDOSCOPY;  Service: Endoscopy;  Laterality: N/A;  wants ped scope   No family history on file. History  Substance Use Topics  . Smoking status: Never Smoker   . Smokeless tobacco: Never Used  . Alcohol Use: No   OB History    No data available     Review of Systems  Constitutional: Positive for fatigue. Negative for fever.  HENT: Negative for trouble swallowing.   Eyes: Negative for visual disturbance.  Respiratory: Positive for cough. Negative for shortness of breath.   Cardiovascular: Negative for chest pain.  Gastrointestinal: Positive for blood in stool. Negative for nausea, vomiting and abdominal pain.  Genitourinary: Negative for dysuria.  Musculoskeletal: Negative for neck pain.  Skin: Negative for rash.  Neurological: Negative for dizziness, weakness and numbness.  Psychiatric/Behavioral: Negative.       Allergies  Review of patient's allergies indicates no known allergies.  Home Medications   Prior to Admission medications   Medication Sig Start Date End Date Taking? Authorizing Provider  amLODipine (NORVASC) 5 MG tablet Take 5 mg by mouth daily.   Yes Historical Provider, MD  Ascorbic Acid (VITAMIN C) 100 MG tablet Take 100 mg by mouth daily. With rose hips 500 mg   Yes Historical Provider, MD  cholecalciferol (VITAMIN D) 1000 UNITS tablet Take 1,000 Units by mouth daily.   Yes Historical Provider, MD  docusate sodium (COLACE) 100 MG capsule Take 100 mg by mouth at bedtime.  Yes Historical Provider, MD  ferrous sulfate 325 (65 FE) MG tablet Take 325 mg by mouth daily with breakfast.   Yes Historical Provider, MD  furosemide (LASIX) 20 MG tablet Take 10 mg by mouth daily.    Yes Historical Provider, MD  guaifenesin (ROBITUSSIN) 100 MG/5ML syrup Take 200 mg by mouth 3 (three) times daily as needed for cough.   Yes Historical Provider, MD  Ibuprofen-Diphenhydramine HCl (ADVIL PM) 200-25 MG CAPS Take 1 capsule by mouth at bedtime as  needed (Sleep/pain).   Yes Historical Provider, MD  Multiple Minerals (CALCIUM-MAGNESIUM-ZINC) TABS Take 1 tablet by mouth daily.   Yes Historical Provider, MD  Pregabalin (LYRICA PO) Take by mouth.   Yes Historical Provider, MD  temazepam (RESTORIL) 7.5 MG capsule Take 7.5 mg by mouth at bedtime as needed for sleep.   Yes Historical Provider, MD   BP 170/74 mmHg  Pulse 89  Temp(Src) 97.8 F (36.6 C) (Oral)  Resp 18  SpO2 94% Physical Exam  Constitutional: She is oriented to person, place, and time. She appears well-developed and well-nourished. No distress.  Elderly, frail appearing female resting comfortably on stretcher in no acute distress.  HENT:  Head: Normocephalic and atraumatic.  Mouth/Throat: Oropharynx is clear and moist. No oropharyngeal exudate.  Eyes: Right eye exhibits no discharge. Left eye exhibits no discharge. No scleral icterus.  Neck: Normal range of motion and full passive range of motion without pain. Neck supple. No spinous process tenderness and no muscular tenderness present. No rigidity. No edema, no erythema and normal range of motion present. No Brudzinski's sign and no Kernig's sign noted.  Cardiovascular: Normal rate, regular rhythm, S1 normal, S2 normal and normal heart sounds.   No murmur heard. Pulmonary/Chest: Effort normal and breath sounds normal. No accessory muscle usage. No tachypnea. No respiratory distress.  Abdominal: Soft. There is generalized tenderness. There is no rigidity, no guarding, no tenderness at McBurney's point and negative Murphy's sign.  Mild, generalized tenderness  Genitourinary: Rectal exam shows no external hemorrhoid, no internal hemorrhoid, no fissure, no mass, no tenderness and anal tone normal. Guaiac positive stool.  Black, tarry colored stools noted. Moderate amount of stool noted in rectal vault. Rectal tone normal. No internal or external hemorrhoids. Chaperone present during entire rectal exam.  Musculoskeletal: Normal  range of motion. She exhibits no edema or tenderness.  Neurological: She is alert and oriented to person, place, and time. She has normal strength. No cranial nerve deficit or sensory deficit. Coordination normal. GCS eye subscore is 4. GCS verbal subscore is 5. GCS motor subscore is 6.  Skin: Skin is warm and dry. No rash noted. She is not diaphoretic.  Psychiatric: She has a normal mood and affect.    ED Course  Procedures (including critical care time) Labs Review Labs Reviewed  COMPREHENSIVE METABOLIC PANEL - Abnormal; Notable for the following:    Glucose, Bld 112 (*)    Total Protein 8.3 (*)    Albumin 3.0 (*)    ALT 10 (*)    All other components within normal limits  LIPASE, BLOOD - Abnormal; Notable for the following:    Lipase 11 (*)    All other components within normal limits  URINALYSIS, ROUTINE W REFLEX MICROSCOPIC (NOT AT [facility]) - Abnormal; Notable for the following:    pH 8.5 (*)    Hgb urine dipstick MODERATE (*)    All other components within normal limits  COMPREHENSIVE METABOLIC PANEL - Abnormal; Notable for the following:  Glucose, Bld 106 (*)    Albumin 3.2 (*)    ALT 8 (*)    All other components within normal limits  CBC - Abnormal; Notable for the following:    WBC 10.6 (*)    RBC 3.00 (*)    Hemoglobin 9.0 (*)    HCT 27.0 (*)    All other components within normal limits  DIFFERENTIAL - Abnormal; Notable for the following:    Monocytes Absolute 1.1 (*)    All other components within normal limits  URINE MICROSCOPIC-ADD ON - Abnormal; Notable for the following:    Squamous Epithelial / LPF FEW (*)    All other components within normal limits  I-STAT CG4 LACTIC ACID, ED - Abnormal; Notable for the following:    Lactic Acid, Venous 2.01 (*)    All other components within normal limits  POC OCCULT BLOOD, ED - Abnormal; Notable for the following:    Fecal Occult Bld POSITIVE (*)    All other components within normal limits  RESPIRATORY VIRUS PANEL   BRAIN NATRIURETIC PEPTIDE  CBC WITH DIFFERENTIAL/PLATELET  OCCULT BLOOD X 1 CARD TO LAB, STOOL  LACTIC ACID, PLASMA  CBC  I-STAT TROPOININ, ED    Imaging Review Dg Chest 2 View  10/08/2014   CLINICAL DATA:  Cough for 1 week  EXAM: CHEST - 2 VIEW  COMPARISON:  01/28/2013  FINDINGS: Cardiac shadow is stable. Aortic calcifications are noted. The lungs are clear bilaterally. No acute bony abnormality is seen. Small hiatal hernia is noted.  IMPRESSION: No active disease.   Electronically Signed   By: Inez Catalina M.D.   On: 10/08/2014 10:56     EKG Interpretation   Date/Time:  Saturday October 08 2014 11:52:59 EDT Ventricular Rate:  86 PR Interval:  208 QRS Duration: 86 QT Interval:  411 QTC Calculation: 492 R Axis:   76 Text Interpretation:  Sinus rhythm Consider left atrial enlargement  Anterior infarct, old No significant change since last tracing Confirmed  by [facility]  MD, Loree Fee ([id number]) on 10/08/2014 12:49:32 PM      MDM   Final diagnoses:  Cough    Patient has generalized weakness, bloody stools. Concern for diverticular bleed, especially with patient's history of same. Hemoglobin and hematocrit appear to be stable, at baseline for patient, although unable to fully determine as patient may be mildly dehydrated based on poor by mouth intake over the past several days. Black colored stool noted on exam, occult blood positive. Patient hemodynamically stable and in no acute distress. Patient will be admitted to medicine for further workup of possible diverticular bleed. The patient appears reasonably stabilized for admission considering the current resources, flow, and capabilities available in the ED at this time, and I doubt any other [facility] requiring further screening and/or treatment in the ED prior to admission.  BP 170/74 mmHg  Pulse 89  Temp(Src) 97.8 F (36.6 C) (Oral)  Resp 18  SpO2 94%  Signed,  Dahlia Bailiff, PA-C 2:52 PM  Patient seen and discussed with Dr. Blanchie Dessert, MD    Dahlia Bailiff, PA-C 10/08/14 1452  Blanchie Dessert, MD 10/09/14 [phone]

## 2014-10-08 NOTE — ED Notes (Signed)
Pt arrived to ED with son with c/o cough all week with productive gray sputum, abdominal pain with some blood in stools yesterday and decreased appetite. Pt son stated that he only saw the blood in stool yesterday and that she has a history of GI bleeds and he thinks its some kind of infection causing her to feel bad.

## 2014-10-08 NOTE — H&P (Addendum)
Patient was seen, examined, treatment plan was discussed with the Physician extender. I have directly reviewed the clinical findings, lab, imaging studies and management of this patient in detail. I have made the necessary changes to the above noted documentation, and agree with the documentation, as recorded by the Physician extender.  Brief narrative: 81 year old female with past medical history of dementia, hypertension, chronic diastolic CHF, diverticulosis. She presented to [facility] ED from SNF with cough productive of yellowish sputum over past few days prior to this admission in addition to blood in stool and failure to thrive. She was recently hospitalized in 07/2014 with diverticular bleed which has spontaneously resolved.  On this admission, she is hemodynamically stable. Blood work was notable for leukocytosis of 10.6, hemoglobin of 9.0. CXR showed no acute cardiopulmonary disease. Her stool was heme positive in ED. She was admitted for further evaluation of GI bleed.  Assessment & Plan  Principal Problem: Lower GI bleed / Acute blood loss anemia / Diverticular bleed - Heme positive stool in ED - Pt has known history of diverticulosis so her bleed is most likely diverticular - She is not a candidate for intensive interventions so for now will monitor CBC - Would get palliative care to address goals of care   Active Problems: Acute bronchitis / Leukocytosis  - Chest x-ray did not show acute cardiopulmonary findings. - Will treat empirically with levaquin for possible bronchitis   Chronic diastolic CHF  - Compensated  - 2 D ECHO in 2013 showed preserved EF  Essential hypertension  -Continue Norvasc for now   Sarah Perkins [place]   *For further details please refer to admission note done by Physician extender done below.    [facility] History and Physical                                                                                    Sarah Perkins, is a  81 y.o. female  MRN: [id number]   DOB - 1917-12-27  Admit Date - 10/08/2014  Outpatient Primary MD for the patient is Thressa Sheller, MD  Referring MD: Dahlia Bailiff PA-C / ER  With History of -  Past Medical History  Diagnosis Date  . CHF (congestive heart failure)   . Hypertension   . Aneurysm 1972    cerebral  . Acid reflux     occasional  . Tremor     worse on right arm  . History of GI diverticular bleed 06/2009    colonoscopy with endo clipping of bleeding tic by Dr Clarene Essex.  Flex sig by Dr Paulita Fujita.  . Acute blood loss anemia 06/2009    received ~ 6 units blood  . Adenomatous polyp of colon 06/2009  . Diverticulitis   . UTI (lower urinary tract infection)   . GI bleed 02/14/2014      Past Surgical History  Procedure Laterality Date  . Hip fracture surgery  2011  . Colonoscopy w/ control of hemorrhage  06/2009  . Knee arthroscopy    . Cerebral aneurysm repair  1970's  . Colonoscopy  09/22/2011    Procedure: COLONOSCOPY;  Surgeon: Juanita Craver, MD;  Location: [facility];  Service:  Endoscopy;  Laterality: N/A;  wants ped scope    in for   Chief Complaint  Patient presents with  . Abdominal Pain  . Cough  . Blood In Stools     HPI This is a 81 year old female patient with underlying history of dementia, hypertension, diverticulosis with prior GI bleeding, chronic diastolic heart failure. Was discharged on 08/24/14 after being treated for recurrent diverticular rectal bleeding without gross bright red blood. She was treated with conservative management and bleeding resolved spontaneously and gastroenterology was therefore not consulted. She apparently was discharged to a skilled nursing facility after that admission. She returns today with her son to the ER with multiple complaints: She has been expressing a productive cough for 3-4 days and the son has noticed blood in her stools beginning on 6/10 and has noticed the patient has had decreased oral intake over the past  several days. Son also reported that the patient has also had worsening urinary incontinence and been complaining of generalized weakness for several days. In discussing with the patient she has denied any symptoms although she is aware of the cough and reports she couldn't sleep last night because of the cough.  In the ER patient was afebrile and actually hypertensive with room air sats varying between 90-95%. She was noted to have a productive wet cough. Rest x-ray was unremarkable. Electrolyte panel was within normal limits except for albumen 3.2 and glucose 106. BNP was 98, troponin 0.00. Lactic acid was mildly elevated at 2.01. She did have minimal leukocytosis with a white count of 10,600 with normal neutrophil count. Hemoglobin was just slightly lower than discharge hemoglobin in April of 9.3 with current hemoglobin 9.0. Urinalysis was essentially unremarkable except for moderate hemoglobin and no evidence of UTI. EDP perform digital rectal exam with dark stools noted which were heme positive. My interview the patient she reiterated the same symptoms. No family at bedside and patient presently admits that she can't always remember things that have occurred and I would need to ask her son. She did remember that she coughs up yellow sputum.   Review of Systems   Unable to obtain from the patient due to her underlying dementia and no family at bedside. Above history obtained from the medical record.  *A full 10 point Review of Systems was done, except as stated above, all other Review of Systems were negative.  Social History History  Substance Use Topics  . Smoking status: Never Smoker   . Smokeless tobacco: Never Used  . Alcohol Use: No    Resides at: Private residence  Lives with: Son  Ambulatory status: Apparently ambulates without devices at home   Family History No family history on file. given her dementia patient unable to contribute to past medical history regarding her family,  nonetheless given her presentation and advanced age likely would not be contributory to current admission problems   Prior to Admission medications   Medication Sig Start Date End Date Taking? Authorizing Provider  amLODipine (NORVASC) 5 MG tablet Take 5 mg by mouth daily.    Historical Provider, MD  Ascorbic Acid (VITAMIN C) 100 MG tablet Take 100 mg by mouth daily. With rose hips 500 mg    Historical Provider, MD  cholecalciferol (VITAMIN D) 1000 UNITS tablet Take 1,000 Units by mouth daily.    Historical Provider, MD  docusate sodium (COLACE) 100 MG capsule Take 100 mg by mouth at bedtime.    Historical Provider, MD  ferrous sulfate 325 (65 FE)  MG tablet Take 325 mg by mouth daily with breakfast.    Historical Provider, MD  furosemide (LASIX) 20 MG tablet Take 10 mg by mouth daily.     Historical Provider, MD  Multiple Minerals (CALCIUM-MAGNESIUM-ZINC) TABS Take 1 tablet by mouth daily.    Historical Provider, MD    No Known Allergies  Physical Exam  Vitals  Blood pressure 189/83, pulse 82, temperature 97.8 F (36.6 C), temperature source Oral, resp. rate 16, SpO2 95 %.   General:  In no acute distress, appears stated age  Psych:  Normal affect, oriented to name only  Neuro:   No focal neurological deficits, CN II through XII intact, Strength 5/5 all 4 extremities, Sensation intact all 4 extremities.  ENT:  Ears and Eyes appear Normal, Conjunctivae clear, PER. Dry oral mucosa without erythema or exudates.  Neck:  Supple, No lymphadenopathy appreciated  Respiratory:  Symmetrical chest wall movement, congested but sounding cough, Good air movement bilaterally, on posterior auscultation bilateral mid field with inspiratory crackles and a few expiratory rhonchi . Room Air  Cardiac:  RRR, No Murmurs, no LE edema noted, no JVD, No carotid bruits, peripheral pulses palpable at 2+  Abdomen:  Positive bowel sounds, Soft, Non tender, Non distended,  No masses appreciated, no obvious  hepatosplenomegaly  Skin:  No Cyanosis, Normal Skin Turgor, No Skin Rash or Bruise.  Extremities: Symmetrical without obvious trauma or injury,  no effusions.  Data Review  CBC  Recent Labs Lab 10/08/14 1215  WBC 10.6*  HGB 9.0*  HCT 27.0*  PLT 275  MCV 90.0  MCH 30.0  MCHC 33.3  RDW 13.4  LYMPHSABS 2.2  MONOABS 1.1*  EOSABS 0.1  BASOSABS 0.1    Chemistries   Recent Labs Lab 10/08/14 1025 10/08/14 1125  NA 138 135  K 4.2 4.7  CL 101 101  CO2 25 25  GLUCOSE 112* 106*  BUN 18 19  CREATININE 0.74 0.72  CALCIUM 9.5 9.5  AST 31 39  ALT 10* 8*  ALKPHOS 46 46  BILITOT 0.9 1.2    CrCl cannot be calculated (Unknown ideal weight.).  No results for input(s): TSH, T4TOTAL, T3FREE, THYROIDAB in the last 72 hours.  Invalid input(s): FREET3  Coagulation profile No results for input(s): INR, PROTIME in the last 168 hours.  No results for input(s): DDIMER in the last 72 hours.  Cardiac Enzymes No results for input(s): CKMB, TROPONINI, MYOGLOBIN in the last 168 hours.  Invalid input(s): CK  Invalid input(s): POCBNP  Urinalysis    Component Value Date/Time   COLORURINE YELLOW 10/08/2014 [place] 10/08/2014 1225   LABSPEC 1.012 10/08/2014 1225   PHURINE 8.5* 10/08/2014 1225   GLUCOSEU NEGATIVE 10/08/2014 1225   HGBUR MODERATE* 10/08/2014 1225   BILIRUBINUR NEGATIVE 10/08/2014 1225   KETONESUR NEGATIVE 10/08/2014 1225   PROTEINUR NEGATIVE 10/08/2014 1225   UROBILINOGEN 0.2 10/08/2014 1225   NITRITE NEGATIVE 10/08/2014 1225   LEUKOCYTESUR NEGATIVE 10/08/2014 1225    Imaging results:   Dg Chest 2 View  10/08/2014   CLINICAL DATA:  Cough for 1 week  EXAM: CHEST - 2 VIEW  COMPARISON:  01/28/2013  FINDINGS: Cardiac shadow is stable. Aortic calcifications are noted. The lungs are clear bilaterally. No acute bony abnormality is seen. Small hiatal hernia is noted.  IMPRESSION: No active disease.   Electronically Signed   By: Inez Catalina M.D.    On: 10/08/2014 10:56     EKG: (Independently reviewed) sinus rhythm, no  ST T-wave changes concerning for skin you, QTC 492 ms   Assessment & Plan  Principal Problem:   GI bleed/known history diverticulosis -Admit to telemetry -No abdominal pain on exam so doubt acute diverticulitis but suspect diverticular bleed -Symptom management; allow clear -Since no frank red bleeding and hemoglobin stable no indication at this juncture to consult gastroenterology and given her advanced age conservative management as we are doing is likely the best option for this patient  Active Problems:   Acute bronchitis -Chest x-ray negative for focal infiltrate and patient afebrile although does have trace leukocytosis -Symptom management and begin pulmonary toileting with flutter valve -As precaution we will utilize low-dose IV Levaquin but will need to check EKG in a.m. since current QTC is 492 ms -Check respiratory virus panel    GERD  -Patient without complaints of reflux    Chronic diastolic CHF  -Congestion at this juncture does not appear related to heart failure exacerbation and given poor oral intake prior to admission will hold Lasix for now    Anemia -Hemoglobin stable and at baseline of around 9 -Repeat CBC in a.m. unless develops active bright red bleeding    HTN  -Continue Norvasc for now    CKD (chronic kidney disease), stage I -Renal function stable and at baseline with BUN 19 and creatinine 0.7 to    Dementia -Given patient's advanced age and recurrent GI bleeding suspect patient and family would benefit from discussion of goals of care since she is currently a full code    DVT Prophylaxis: SCDs  Family Communication:   No family available at time of my interview with patient  Code Status:  Full code  Condition:  Stable  Discharge disposition: Anticipate will discharge back to home with son once GI bleeding symptoms resolve; Also need to ensure respiratory status does not  worsen-may benefit from physical therapy evaluation this admission  Time spent in minutes : 60      ELLIS,ALLISON L. ANP on 10/08/2014 at 2:07 PM  Between 7am to 7pm - Pager - [phone]  After 7pm go to [email or web address] - password [email or web address]  And look for the night coverage person covering me after hours  [facility]

## 2014-10-08 NOTE — ED Notes (Signed)
Attempted report 

## 2014-10-08 NOTE — ED Notes (Signed)
In X Ray 

## 2014-10-08 NOTE — ED Notes (Signed)
Patient transported to X-ray 

## 2014-10-09 DIAGNOSIS — K5900 Constipation, unspecified: Secondary | ICD-10-CM

## 2014-10-09 DIAGNOSIS — K5731 Diverticulosis of large intestine without perforation or abscess with bleeding: Secondary | ICD-10-CM

## 2014-10-09 DIAGNOSIS — R05 Cough: Secondary | ICD-10-CM

## 2014-10-09 DIAGNOSIS — N181 Chronic kidney disease, stage 1: Secondary | ICD-10-CM

## 2014-10-09 DIAGNOSIS — R059 Cough, unspecified: Secondary | ICD-10-CM | POA: Insufficient documentation

## 2014-10-09 DIAGNOSIS — Z515 Encounter for palliative care: Secondary | ICD-10-CM | POA: Insufficient documentation

## 2014-10-09 LAB — CBC
HCT: 25.4 % — ABNORMAL LOW (ref 36.0–46.0)
HEMOGLOBIN: 8.4 g/dL — AB (ref 12.0–15.0)
MCH: 30 pg (ref 26.0–34.0)
MCHC: 33.1 g/dL (ref 30.0–36.0)
MCV: 90.7 fL (ref 78.0–100.0)
PLATELETS: 254 10*3/uL (ref 150–400)
RBC: 2.8 MIL/uL — AB (ref 3.87–5.11)
RDW: 13.4 % (ref 11.5–15.5)
WBC: 8.9 10*3/uL (ref 4.0–10.5)

## 2014-10-09 LAB — BASIC METABOLIC PANEL
ANION GAP: 7 (ref 5–15)
BUN: 14 mg/dL (ref 6–20)
CALCIUM: 8.5 mg/dL — AB (ref 8.9–10.3)
CO2: 25 mmol/L (ref 22–32)
CREATININE: 0.69 mg/dL (ref 0.44–1.00)
Chloride: 103 mmol/L (ref 101–111)
GFR calc non Af Amer: >60 mL/min (ref >60)
GLUCOSE: 103 mg/dL — AB (ref 65–99)
Potassium: 3.4 mmol/L — ABNORMAL LOW (ref 3.5–5.1)
SODIUM: 135 mmol/L (ref 135–145)

## 2014-10-09 LAB — MRSA PCR SCREENING: MRSA by PCR: NEGATIVE

## 2014-10-09 MED ORDER — GUAIFENESIN ER 600 MG PO TB12
1200.0000 mg | ORAL_TABLET | Freq: Two times a day (BID) | ORAL | Status: DC
  Administered 2014-10-09 – 2014-10-11 (×5): 1200 mg via ORAL
  Filled 2014-10-09 (×7): qty 2

## 2014-10-09 MED ORDER — POLYETHYLENE GLYCOL 3350 17 G PO PACK
17.0000 g | PACK | Freq: Every day | ORAL | Status: DC | PRN
  Administered 2014-10-10: 17 g via ORAL
  Filled 2014-10-09 (×2): qty 1

## 2014-10-09 MED ORDER — DOCUSATE SODIUM 100 MG PO CAPS
200.0000 mg | ORAL_CAPSULE | Freq: Every day | ORAL | Status: DC
  Administered 2014-10-09 – 2014-10-10 (×2): 200 mg via ORAL
  Filled 2014-10-09 (×2): qty 2

## 2014-10-09 MED ORDER — LEVOFLOXACIN IN D5W 250 MG/50ML IV SOLN
250.0000 mg | INTRAVENOUS | Status: DC
  Administered 2014-10-09 – 2014-10-10 (×2): 250 mg via INTRAVENOUS
  Filled 2014-10-09 (×3): qty 50

## 2014-10-09 MED ORDER — GUAIFENESIN-DM 100-10 MG/5ML PO SYRP
5.0000 mL | ORAL_SOLUTION | ORAL | Status: DC | PRN
  Administered 2014-10-10: 5 mL via ORAL
  Filled 2014-10-09: qty 5

## 2014-10-09 NOTE — Consult Note (Signed)
Consultation Note Date: 10/09/2014   Patient Name: Sarah Perkins  DOB: 1918-04-18  MRN: [id number]  Age / Sex: 81 y.o., female   PCP: Thressa Sheller, MD Referring Physician: Verlee Monte, MD  Reason for Consultation: Establishing goals of care  Palliative Care Assessment and Plan Summary of Established Goals of Care and Medical Treatment Preferences   Clinical Assessment/Narrative: 81 yo female with dementia, HFpEF, diverticulosis and recurrent GI bleed. Admitted for suspected LGIB and potential bronchitis  Contacts/Participants in Discussion: Primary Decision Maker: son [name] HCPOA: none documented   Spoke with son [name] today.  I do not think patient has capacity to make complex medical decisions.  She has had workup for GI bleeds in the past and back in October plan was for IR embolization, but it appears bleed stopped or slowed at that time.  [name] understands that bleed is likely diverticular in nature and likely to be lifelong problem for her.  We also talked at length about her dementia and decline she is experiencing from this as well.  [name] does not display great health literacy, but we spend a long time talking about what to expect from dementia standpoint and how this often progresses to end stages through declining functional status, decreased PO intake, recurrent infections, etc.  [name] main goal is to keep her at home for as long as possible.  She has gone to SNF in past and gotten strong enough to return home with him but he acknowledges that eventually he will not be able to care for her any longer.  He is feeling this become harder already. She still enjoys reading and watching TV despite issues with her memory.  At this point [name] wishes to do what he can to manage GI bleed and possible infectious causes with eventual return home with him.  Today was mainly trying to reinforce that decline is likely to be expected for her and what this may look like.  We  also put down some groundwork that medical interventions may not make sense if it is not getting her back to place where she is able to find meaning, joy purpose in her life and how these things are signs that people are reaching end stages of their disease processes.    Code Status/Advance Care Planning:  Full, discussed on admission and I did not re-address today.   Symptom Management:   Cough- improving.  On abx. Could add tessalon if needed  Constipation- per son sometimes hard to get her to move bowels. He is using unknown OTC stool softener with little effect.  I will add docusate and PRN miralax. Would encourage bowel regimen to help avoid diverticular rebleeding.    Psycho-social/Spiritual:   Support System: son [name]. Had another son who is deceased.     Prognosis: Unable to determine  Discharge Planning:  TBD       Chief Complaint: Cough  History of Present Illness:  81 yo female with PMHx of dementia, HTN, HFpEF, diverticulosis admitted from home after d/c from SNF/rehab 2 weeks prior with complaints of cough, hematochezia, and FTT.  Unclear duration of symptoms from records and patient unable to provide meaningful history because of her dementia.  She was also admitted in April for Diverticular bleed as well as in October 2015 with diverticular bleed.  She has undergone CTA in the past and almost had IR embolization of one of the bleeds from right side of her colon, but it appears bleeding had significantly slowed or stopped.  Her last colonoscopy was in 2013 which revealed numerous diverticulum.  Today she can tell me she has cough, but does not know that she is in hospital and tells me she lives in [place]. I am unable to obtain any other symptoms from her with her dementia and likely baseline confusion.    In speaking with her son, it sounds like the past 4 days she has not been herself.  She had cough, weakness, decreased PO intake. Noted blood in stool the  other day but difficult for him to quantify.  Has trouble getting her to move bowels at times as well.  Overall has been difficult to get her out of bed, but she does and able to walk with walker and family.  Normally eats well, but past week has been difficult.    Primary Diagnoses  Present on Admission:  . HTN (hypertension) . CKD (chronic kidney disease), stage I . Diverticulosis . GERD (gastroesophageal reflux disease) . Dementia . GI bleed . Chronic diastolic CHF (congestive heart failure) . Acute bronchitis . Anemia   I have reviewed the medical record, interviewed the patient and family, and examined the patient. The following aspects are pertinent.  Past Medical History  Diagnosis Date  . CHF (congestive heart failure)   . Hypertension   . Aneurysm 1972    cerebral  . Acid reflux     occasional  . Tremor     worse on right arm  . History of GI diverticular bleed 06/2009    colonoscopy with endo clipping of bleeding tic by Dr Clarene Essex.  Flex sig by Dr Paulita Fujita.  . Acute blood loss anemia 06/2009    received ~ 6 units blood  . Adenomatous polyp of colon 06/2009  . Diverticulitis   . UTI (lower urinary tract infection)   . GI bleed 02/14/2014   History   Social History  . Marital Status: Divorced    Spouse Name: N/A  . Number of Children: N/A  . Years of Education: N/A   Social History Main Topics  . Smoking status: Never Smoker   . Smokeless tobacco: Never Used  . Alcohol Use: No  . Drug Use: No  . Sexual Activity: No   Other Topics Concern  . None   Social History Narrative   No family history on file. Scheduled Meds: . amLODipine  5 mg Oral Daily  . ferrous sulfate  325 mg Oral Q breakfast  . folic acid  1 mg Intravenous Daily  . levofloxacin (LEVAQUIN) IV  500 mg Intravenous Q24H  . sodium chloride  3 mL Intravenous Q12H  . thiamine  100 mg Intravenous Daily   Continuous Infusions:  PRN Meds:.acetaminophen **OR** acetaminophen, alum & mag  hydroxide-simeth, ondansetron **OR** ondansetron (ZOFRAN) IV Medications Prior to Admission:  Prior to Admission medications   Medication Sig Start Date End Date Taking? Authorizing Provider  amLODipine (NORVASC) 5 MG tablet Take 5 mg by mouth daily.   Yes Historical Provider, MD  Ascorbic Acid (VITAMIN C) 100 MG tablet Take 100 mg by mouth daily. With rose hips 500 mg   Yes Historical Provider, MD  cholecalciferol (VITAMIN D) 1000 UNITS tablet Take 1,000 Units by mouth daily.   Yes Historical Provider, MD  docusate sodium (COLACE) 100 MG capsule Take 100 mg by mouth at bedtime.   Yes Historical Provider, MD  ferrous sulfate 325 (65 FE) MG tablet Take 325 mg by mouth daily with breakfast.   Yes Historical Provider, MD  furosemide (LASIX) 20 MG tablet Take 10 mg by mouth daily.    Yes Historical Provider, MD  guaifenesin (ROBITUSSIN) 100 MG/5ML syrup Take 200 mg by mouth 3 (three) times daily as needed for cough.   Yes Historical Provider, MD  Ibuprofen-Diphenhydramine HCl (ADVIL PM) 200-25 MG CAPS Take 1 capsule by mouth at bedtime as needed (Sleep/pain).   Yes Historical Provider, MD  Multiple Minerals (CALCIUM-MAGNESIUM-ZINC) TABS Take 1 tablet by mouth daily.   Yes Historical Provider, MD  Pregabalin (LYRICA PO) Take by mouth.   Yes Historical Provider, MD  temazepam (RESTORIL) 7.5 MG capsule Take 7.5 mg by mouth at bedtime as needed for sleep.   Yes Historical Provider, MD   No Known Allergies CBC:    Component Value Date/Time   WBC 8.9 10/09/2014 0037   WBC 8.5 06/01/2009 1500   HGB 8.4* 10/09/2014 0037   HGB 11.8 06/01/2009 1500   HCT 25.4* 10/09/2014 0037   HCT 36.4 06/01/2009 1500   PLT 254 10/09/2014 0037   PLT 328 06/01/2009 1500   MCV 90.7 10/09/2014 0037   MCV 92.6 06/01/2009 1500   NEUTROABS 7.1 10/08/2014 1215   NEUTROABS 5.0 06/01/2009 1500   LYMPHSABS 2.2 10/08/2014 1215   LYMPHSABS 2.5 06/01/2009 1500   MONOABS 1.1* 10/08/2014 1215   MONOABS 0.8 06/01/2009 1500    EOSABS 0.1 10/08/2014 1215   EOSABS 0.1 06/01/2009 1500   BASOSABS 0.1 10/08/2014 1215   BASOSABS 0.1 06/01/2009 1500   Comprehensive Metabolic Panel:    Component Value Date/Time   NA 135 10/09/2014 0037   K 3.4* 10/09/2014 0037   CL 103 10/09/2014 0037   CO2 25 10/09/2014 0037   BUN 14 10/09/2014 0037   CREATININE 0.69 10/09/2014 0037   CREATININE 0.73 01/26/2009 1310   GLUCOSE 103* 10/09/2014 0037   CALCIUM 8.5* 10/09/2014 0037   AST 39 10/08/2014 1125   ALT 8* 10/08/2014 1125   ALKPHOS 46 10/08/2014 1125   BILITOT 1.2 10/08/2014 1125   PROT 8.0 10/08/2014 1125   ALBUMIN 3.2* 10/08/2014 1125    Physical Exam: Vital Signs: BP 154/67 mmHg  Pulse 75  Temp(Src) 97.9 F (36.6 C) (Oral)  Resp 18  Ht 5\' 7"  (1.702 m)  Wt 60.056 kg (132 lb 6.4 oz)  BMI 20.73 kg/m2  SpO2 95% SpO2: SpO2: 95 % O2 Device: O2 Device: Nasal Cannula O2 Flow Rate: O2 Flow Rate (L/min): 2 L/min Intake/output summary:  Intake/Output Summary (Last 24 hours) at 10/09/14 0925 Last data filed at 10/09/14 0200  Gross per 24 hour  Intake    240 ml  Output      0 ml  Net    240 ml   LBM: Last BM Date: 10/08/14 Baseline Weight: Weight: 61.1 kg (134 lb 11.2 oz) Most recent weight: Weight: 60.056 kg (132 lb 6.4 oz) (BEDSCALE)  Exam Findings:  GEN: alert, NAD HEENT: [place], sclera anicteric CV: reg rate ABD: soft EXT: warm         Palliative Performance Scale:50            Additional Data Reviewed: Recent Labs     10/08/14  1125  10/08/14  1215  10/09/14  0037  WBC   --   10.6*  8.9  HGB   --   9.0*  8.4*  PLT   --   275  254  NA  135   --   135  BUN  19   --   14  CREATININE  0.72   --   0.69   6/11 CXR IMPRESSION: No active disease.  Time Total: 50 minutes Greater than 50%  of this time was spent counseling and coordinating care related to the above assessment and plan.  Signed by: Isaac Laud, DO  Doran Clay, DO  10/09/2014, 9:25 AM  Please contact Palliative  Medicine Team phone at [phone] for questions and concerns.

## 2014-10-09 NOTE — Progress Notes (Signed)
[facility] PROGRESS NOTE   Sarah Perkins IRS:[id number] DOB: 02-08-1918 DOA: 10/08/2014 PCP: Thressa Sheller, MD  HPI/Subjective: Appears comfortable, coughing every now and then. Was eating her breakfast when I interviewed her this morning.  Assessment/Plan: Principal Problem:   GI bleed Active Problems:   HTN (hypertension)   CKD (chronic kidney disease), stage I   GERD (gastroesophageal reflux disease)   Diverticulosis   Dementia   Chronic diastolic CHF (congestive heart failure)   Acute bronchitis   Anemia   CN (constipation)   Cough   Palliative care encounter    Lower GI bleed Likely diverticular bleed, patient has heme positive stool in the emergency department. Has known history of diverticulosis and previous diverticular bleeds, this is likely similar. Patient is not candidate for invasive intervention, we will conservatively manage her. Hopefully this will cease on its own. No blood thinners.  Acute blood loss anemia Mild acute blood loss anemia, hemoglobin baseline 9.3 in April, presented with hemoglobin of 9 today. Hemoglobin dropped down to 8.4, hemodilution with IV fluids also might be contributing. We'll transfuse if hemoglobin drops less than 7.5.  Acute bronchitis Patient has cough, shortness of breath, sputum production and no findings on x-ray. Symptoms consistent with acute bronchitis, started on levofloxacin. Supportive treatment with Mucinex, Robitussin and oxygen as needed.  Chronic diastolic CHF Compensated CHF, 2-D echo back in 2013 showed preserved LVEF.  Essential hypertension Continue home medications.  Code Status: Full Code Family Communication: Plan discussed with the patient. Disposition Plan: Remains inpatient Diet: Diet clear liquid Room service appropriate?: Yes; Fluid consistency:: Thin  Consultants:  Palliative care  Procedures:  None  Antibiotics:  Levaquin   Objective: Filed Vitals:   10/09/14  1019  BP: 144/69  Pulse:   Temp:   Resp:     Intake/Output Summary (Last 24 hours) at 10/09/14 1119 Last data filed at 10/09/14 0200  Gross per 24 hour  Intake    240 ml  Output      0 ml  Net    240 ml   Filed Weights   10/08/14 1524 10/09/14 0542  Weight: 61.1 kg (134 lb 11.2 oz) 60.056 kg (132 lb 6.4 oz)    Exam: General: Alert and awake, oriented x3, not in any acute distress. HEENT: anicteric sclera, pupils reactive to light and accommodation, EOMI CVS: S1-S2 clear, no murmur rubs or gallops Chest: clear to auscultation bilaterally, no wheezing, rales or rhonchi Abdomen: soft nontender, nondistended, normal bowel sounds, no organomegaly Extremities: no cyanosis, clubbing or edema noted bilaterally Neuro: Cranial nerves II-XII intact, no focal neurological deficits  Data Reviewed: Basic Metabolic Panel:  Recent Labs Lab 10/08/14 1025 10/08/14 1125 10/09/14 0037  NA 138 135 135  K 4.2 4.7 3.4*  CL 101 101 103  CO2 25 25 25   GLUCOSE 112* 106* 103*  BUN 18 19 14   CREATININE 0.74 0.72 0.69  CALCIUM 9.5 9.5 8.5*   Liver Function Tests:  Recent Labs Lab 10/08/14 1025 10/08/14 1125  AST 31 39  ALT 10* 8*  ALKPHOS 46 46  BILITOT 0.9 1.2  PROT 8.3* 8.0  ALBUMIN 3.0* 3.2*    Recent Labs Lab 10/08/14 1025  LIPASE 11*   No results for input(s): AMMONIA in the last 168 hours. CBC:  Recent Labs Lab 10/08/14 1215 10/09/14 0037  WBC 10.6* 8.9  NEUTROABS 7.1  --   HGB 9.0* 8.4*  HCT 27.0* 25.4*  MCV 90.0 90.7  PLT 275 254   Cardiac Enzymes:  No results for input(s): CKTOTAL, CKMB, CKMBINDEX, TROPONINI in the last 168 hours. BNP (last 3 results)  Recent Labs  10/08/14 1025  BNP 98.5    ProBNP (last 3 results) No results for input(s): PROBNP in the last 8760 hours.  CBG: No results for input(s): GLUCAP in the last 168 hours.  Micro Recent Results (from the past 240 hour(s))  MRSA PCR Screening     Status: None   Collection Time:  10/09/14  5:30 AM  Result Value Ref Range Status   MRSA by PCR NEGATIVE NEGATIVE Final    Comment:        The GeneXpert MRSA Assay (FDA approved for NASAL specimens only), is one component of a comprehensive MRSA colonization surveillance program. It is not intended to diagnose MRSA infection nor to guide or monitor treatment for MRSA infections.      Studies: Dg Chest 2 View  10/08/2014   CLINICAL DATA:  Cough for 1 week  EXAM: CHEST - 2 VIEW  COMPARISON:  01/28/2013  FINDINGS: Cardiac shadow is stable. Aortic calcifications are noted. The lungs are clear bilaterally. No acute bony abnormality is seen. Small hiatal hernia is noted.  IMPRESSION: No active disease.   Electronically Signed   By: Inez Catalina M.D.   On: 10/08/2014 10:56    Scheduled Meds: . amLODipine  5 mg Oral Daily  . docusate sodium  200 mg Oral Daily  . ferrous sulfate  325 mg Oral Q breakfast  . folic acid  1 mg Intravenous Daily  . levofloxacin (LEVAQUIN) IV  250 mg Intravenous Q24H  . sodium chloride  3 mL Intravenous Q12H  . thiamine  100 mg Intravenous Daily   Continuous Infusions:      Time spent: 35 minutes    [facility]  [facility] Pager [phone] If 7PM-7AM, please contact night-coverage at [email or web address], password [facility] 10/09/2014, 11:19 AM  LOS: 1 day

## 2014-10-10 DIAGNOSIS — F0390 Unspecified dementia without behavioral disturbance: Secondary | ICD-10-CM

## 2014-10-10 LAB — BASIC METABOLIC PANEL
ANION GAP: 8 (ref 5–15)
BUN: 14 mg/dL (ref 6–20)
CALCIUM: 8.7 mg/dL — AB (ref 8.9–10.3)
CO2: 26 mmol/L (ref 22–32)
CREATININE: 0.63 mg/dL (ref 0.44–1.00)
Chloride: 105 mmol/L (ref 101–111)
Glucose, Bld: 106 mg/dL — ABNORMAL HIGH (ref 65–99)
Potassium: 3.7 mmol/L (ref 3.5–5.1)
SODIUM: 139 mmol/L (ref 135–145)

## 2014-10-10 LAB — CBC
HCT: 28.5 % — ABNORMAL LOW (ref 36.0–46.0)
HEMOGLOBIN: 9.4 g/dL — AB (ref 12.0–15.0)
MCH: 29.9 pg (ref 26.0–34.0)
MCHC: 33 g/dL (ref 30.0–36.0)
MCV: 90.8 fL (ref 78.0–100.0)
Platelets: 286 10*3/uL (ref 150–400)
RBC: 3.14 MIL/uL — ABNORMAL LOW (ref 3.87–5.11)
RDW: 13.4 % (ref 11.5–15.5)
WBC: 12.2 10*3/uL — ABNORMAL HIGH (ref 4.0–10.5)

## 2014-10-10 MED ORDER — FOLIC ACID 1 MG PO TABS
1.0000 mg | ORAL_TABLET | Freq: Every day | ORAL | Status: DC
  Administered 2014-10-10 – 2014-10-11 (×2): 1 mg via ORAL
  Filled 2014-10-10 (×2): qty 1

## 2014-10-10 MED ORDER — POLYETHYLENE GLYCOL 3350 17 G PO PACK
17.0000 g | PACK | Freq: Once | ORAL | Status: AC
  Administered 2014-10-10: 17 g via ORAL
  Filled 2014-10-10: qty 1

## 2014-10-10 MED ORDER — VITAMIN B-1 100 MG PO TABS
100.0000 mg | ORAL_TABLET | Freq: Every day | ORAL | Status: DC
  Administered 2014-10-10 – 2014-10-11 (×2): 100 mg via ORAL
  Filled 2014-10-10 (×2): qty 1

## 2014-10-10 MED ORDER — MAGNESIUM HYDROXIDE 400 MG/5ML PO SUSP
30.0000 mL | Freq: Once | ORAL | Status: AC
  Administered 2014-10-10: 30 mL via ORAL
  Filled 2014-10-10: qty 30

## 2014-10-10 MED ORDER — SENNOSIDES-DOCUSATE SODIUM 8.6-50 MG PO TABS
2.0000 | ORAL_TABLET | Freq: Two times a day (BID) | ORAL | Status: DC
  Administered 2014-10-10 – 2014-10-11 (×3): 2 via ORAL
  Filled 2014-10-10 (×4): qty 2

## 2014-10-10 NOTE — Progress Notes (Signed)
[facility] PROGRESS NOTE   Sarah Perkins MWN:[id number] DOB: 1917-05-04 DOA: 10/08/2014 PCP: Thressa Sheller, MD  HPI/Subjective: Appears comfortable, she has wet cough, not able to bring out anything. Overall appears to be better. Likely can be discharged back to her nursing home in a.m. No bleeding reported, hemoglobin is increasing.  Assessment/Plan: Principal Problem:   GI bleed Active Problems:   HTN (hypertension)   CKD (chronic kidney disease), stage I   GERD (gastroesophageal reflux disease)   Diverticulosis   Dementia   Chronic diastolic CHF (congestive heart failure)   Acute bronchitis   Anemia   CN (constipation)   Cough   Palliative care encounter    Lower GI bleed Likely diverticular bleed, patient has heme positive stool in the emergency department. Has known history of diverticulosis and previous diverticular bleeds, this is likely similar. Patient is not candidate for invasive intervention, we will conservatively manage her. Bleeding appears to be stopped on its own, no reported bleeding.  Acute blood loss anemia Mild acute blood loss anemia, hemoglobin baseline 9.3 in April, presented with hemoglobin of 9 today. Hemoglobin dropped down to 8.4, hemodilution with IV fluids also might be contributing. We'll transfuse if hemoglobin drops less than 7.5. Hemoglobin today is 9.4.  Acute bronchitis Patient has cough, shortness of breath, sputum production and no findings on x-ray. Symptoms consistent with acute bronchitis, started on levofloxacin. Supportive treatment with Mucinex, Robitussin and oxygen as needed. Poor cough at flutter valve.  Chronic diastolic CHF Compensated CHF, 2-D echo back in 2013 showed preserved LVEF.  Essential hypertension Continue home medications.  Code Status: Full Code Family Communication: Plan discussed with the patient. Disposition Plan: Remains inpatient Diet: Diet clear liquid Room service  appropriate?: Yes; Fluid consistency:: Thin  Consultants:  Palliative care  Procedures:  None  Antibiotics:  Levaquin   Objective: Filed Vitals:   10/10/14 1000  BP: 160/74  Pulse:   Temp: 98.9 F (37.2 C)  Resp: 18    Intake/Output Summary (Last 24 hours) at 10/10/14 1144 Last data filed at 10/09/14 1700  Gross per 24 hour  Intake    170 ml  Output      0 ml  Net    170 ml   Filed Weights   10/08/14 1524 10/09/14 0542 10/10/14 0551  Weight: 61.1 kg (134 lb 11.2 oz) 60.056 kg (132 lb 6.4 oz) 59.998 kg (132 lb 4.4 oz)    Exam: General: Alert and awake, oriented x3, not in any acute distress. HEENT: anicteric sclera, pupils reactive to light and accommodation, EOMI CVS: S1-S2 clear, no murmur rubs or gallops Chest: clear to auscultation bilaterally, no wheezing, rales or rhonchi Abdomen: soft nontender, nondistended, normal bowel sounds, no organomegaly Extremities: no cyanosis, clubbing or edema noted bilaterally Neuro: Cranial nerves II-XII intact, no focal neurological deficits  Data Reviewed: Basic Metabolic Panel:  Recent Labs Lab 10/08/14 1025 10/08/14 1125 10/09/14 0037 10/10/14 0400  NA 138 135 135 139  K 4.2 4.7 3.4* 3.7  CL 101 101 103 105  CO2 25 25 25 26   GLUCOSE 112* 106* 103* 106*  BUN 18 19 14 14   CREATININE 0.74 0.72 0.69 0.63  CALCIUM 9.5 9.5 8.5* 8.7*   Liver Function Tests:  Recent Labs Lab 10/08/14 1025 10/08/14 1125  AST 31 39  ALT 10* 8*  ALKPHOS 46 46  BILITOT 0.9 1.2  PROT 8.3* 8.0  ALBUMIN 3.0* 3.2*    Recent Labs Lab 10/08/14 1025  LIPASE 11*   No  results for input(s): AMMONIA in the last 168 hours. CBC:  Recent Labs Lab 10/08/14 1215 10/09/14 0037 10/10/14 0400  WBC 10.6* 8.9 12.2*  NEUTROABS 7.1  --   --   HGB 9.0* 8.4* 9.4*  HCT 27.0* 25.4* 28.5*  MCV 90.0 90.7 90.8  PLT 275 254 286   Cardiac Enzymes: No results for input(s): CKTOTAL, CKMB, CKMBINDEX, TROPONINI in the last 168 hours. BNP  (last 3 results)  Recent Labs  10/08/14 1025  BNP 98.5    ProBNP (last 3 results) No results for input(s): PROBNP in the last 8760 hours.  CBG: No results for input(s): GLUCAP in the last 168 hours.  Micro Recent Results (from the past 240 hour(s))  Respiratory virus panel     Status: None   Collection Time: 10/08/14  2:55 PM  Result Value Ref Range Status   Source - RVPAN NASOPHARYNGEAL  Corrected   Respiratory Syncytial Virus A TNP  Final    Comment: Test not performed   Respiratory Syncytial Virus B TNP  Final    Comment: Test not performed   Influenza A NFSR  Final    Comment: (NOTE) No frozen specimen received. Notified Acquanetta Sit 10/10/2014    Influenza B TNP  Final    Comment: Test not performed   Parainfluenza 1 TNP  Final    Comment: Test not performed   Parainfluenza 2 TNP  Final    Comment: Test not performed   Parainfluenza 3 TNP  Final    Comment: Test not performed   Metapneumovirus TNP  Final    Comment: Test not performed   Rhinovirus TNP  Final    Comment: Test not performed   Adenovirus TNP  Final    Comment: (NOTE) Test not performed Performed At: [facility] [place], [place] [id number] Lindon Romp MD BS:[id number]   MRSA PCR Screening     Status: None   Collection Time: 10/09/14  5:30 AM  Result Value Ref Range Status   MRSA by PCR NEGATIVE NEGATIVE Final    Comment:        The GeneXpert MRSA Assay (FDA approved for NASAL specimens only), is one component of a comprehensive MRSA colonization surveillance program. It is not intended to diagnose MRSA infection nor to guide or monitor treatment for MRSA infections.      Studies: No results found.  Scheduled Meds: . amLODipine  5 mg Oral Daily  . docusate sodium  200 mg Oral Daily  . ferrous sulfate  325 mg Oral Q breakfast  . folic acid  1 mg Oral Daily  . guaiFENesin  1,200 mg Oral BID  . levofloxacin (LEVAQUIN) IV  250 mg Intravenous Q24H  .  sodium chloride  3 mL Intravenous Q12H  . thiamine  100 mg Oral Daily   Continuous Infusions:      Time spent: 35 minutes    [facility] A  [facility] Pager [phone] If 7PM-7AM, please contact night-coverage at [email or web address], password [facility] 10/10/2014, 11:44 AM  LOS: 2 days

## 2014-10-10 NOTE — Progress Notes (Signed)
Daily Progress Note   Patient Name: Sarah Perkins       Date: 10/10/2014 DOB: [birth year]  Age: 81 y.o. MRN#: [id number] Attending Physician: Verlee Monte, MD Primary Care Physician: Thressa Sheller, MD Admit Date: 10/08/2014  Reason for Consultation/Follow-up: Establishing goals of care  Subjective: Having some abdominal discomfort today. Son estimates it has been one week since last BM. No other complaints. Feels like cough is getting better.   Length of Stay: 2 days  Current Medications: Scheduled Meds:  . amLODipine  5 mg Oral Daily  . ferrous sulfate  325 mg Oral Q breakfast  . folic acid  1 mg Oral Daily  . guaiFENesin  1,200 mg Oral BID  . levofloxacin (LEVAQUIN) IV  250 mg Intravenous Q24H  . polyethylene glycol  17 g Oral Once  . senna-docusate  2 tablet Oral BID  . sodium chloride  3 mL Intravenous Q12H  . thiamine  100 mg Oral Daily    Continuous Infusions:    PRN Meds: acetaminophen **OR** acetaminophen, alum & mag hydroxide-simeth, guaiFENesin-dextromethorphan, ondansetron **OR** ondansetron (ZOFRAN) IV, polyethylene glycol   Vital Signs: BP 154/68 mmHg  Pulse 92  Temp(Src) 97.8 F (36.6 C) (Oral)  Resp 18  Ht 5\' 7"  (1.702 m)  Wt 59.998 kg (132 lb 4.4 oz)  BMI 20.71 kg/m2  SpO2 95% SpO2: SpO2: 95 % O2 Device: O2 Device: Nasal Cannula O2 Flow Rate: O2 Flow Rate (L/min): 2 L/min  Intake/output summary:  Intake/Output Summary (Last 24 hours) at 10/10/14 1430 Last data filed at 10/10/14 1000  Gross per 24 hour  Intake    360 ml  Output      0 ml  Net    360 ml   LBM:   Baseline Weight: Weight: 61.1 kg (134 lb 11.2 oz) Most recent weight: Weight: 59.998 kg (132 lb 4.4 oz)  Physical Exam: GEN: alert, NAD CV: reg rate             Additional Data Reviewed: Recent Labs     10/09/14  0037  10/10/14  0400  WBC  8.9  12.2*  HGB  8.4*  9.4*  PLT  254  286  NA  135  139  BUN  14  14  CREATININE  0.69  0.63     Problem List:  Patient  Active Problem List   Diagnosis Date Noted  . CN (constipation)   . Cough   . Palliative care encounter   . Acute bronchitis 10/08/2014  . Anemia 10/08/2014  . Rectal bleeding 08/21/2014  . Chronic diastolic CHF (congestive heart failure) 02/22/2014  . GI bleed 02/14/2014  . GIB (gastrointestinal bleeding) 02/14/2014  . Abnormal EKG 02/14/2014  . Dementia 01/28/2013  . Acute posthemorrhagic anemia 09/20/2011  . HTN (hypertension) 05/15/2011  . CKD (chronic kidney disease), stage I 05/15/2011  . GERD (gastroesophageal reflux disease) 05/15/2011  . Diverticulosis 05/15/2011     Palliative Care Assessment & Plan  81 yo female with dementia, HFpEF, diverticulosis and recurrent GI bleed. Admitted for suspected LGIB and potential bronchitis  Code Status:  Full code  Goals of Care:  Talked today with son.  He has poor health literacy.  He is very anxious and asks frequent questions (often repetative) relating to bleeding, possible infections, etc. Asks leading questions of "She is going to get better, right!?". I frankly had to name how this has seemingly made him quite anxious to which he agreed.  I wonder if some of this  is anticipatory grief.  He has been seeing her decline from cognitive/dementia standpoint and is preparing somewhat that he may not be able to care for her at home anymore in near future.  She has no advance directives to address this decline and [name] always gets asked about this when they come in the hospital.  He is somewhat avoidant of this conversation and talks about how when he made his brother DNR, he died right away. Tried to support him and encouraged him to prepare somewhat for this decline down the road and how to prepare for it. I think this will be difficult for him to do.  I would recommend palliative care consultation in future admissions to continue to support them and work on goals of care.   3. Symptom Management:  Constipation- give additional dose  of miralax today. Add senna.    Cough- improving  4. Prognosis: Unable to determine  5. Discharge Planning: TBD   Care plan was discussed with [name] (son)  Thank you for allowing the Palliative Medicine Team to assist in the care of this patient.   Total Time: 30 minutes Greater than 50%  of this time was spent counseling and coordinating care related to the above assessment and plan.   Doran Clay, DO  10/10/2014, 2:30 PM  Please contact Palliative Medicine Team phone at [phone] for questions and concerns.

## 2014-10-10 NOTE — Progress Notes (Signed)
Received report from Maudie Mercury, [place], and assumed care of [name], for 3p-7p shift.  Pt resting in bed and pleasant with conversation.  Denies pain.  Alert and oriented to person only. Bed alarm turned on.

## 2014-10-11 DIAGNOSIS — K219 Gastro-esophageal reflux disease without esophagitis: Secondary | ICD-10-CM | POA: Diagnosis not present

## 2014-10-11 DIAGNOSIS — R627 Adult failure to thrive: Secondary | ICD-10-CM | POA: Diagnosis not present

## 2014-10-11 DIAGNOSIS — G8929 Other chronic pain: Secondary | ICD-10-CM | POA: Diagnosis not present

## 2014-10-11 DIAGNOSIS — M6281 Muscle weakness (generalized): Secondary | ICD-10-CM | POA: Diagnosis not present

## 2014-10-11 DIAGNOSIS — D62 Acute posthemorrhagic anemia: Secondary | ICD-10-CM | POA: Diagnosis not present

## 2014-10-11 DIAGNOSIS — I129 Hypertensive chronic kidney disease with stage 1 through stage 4 chronic kidney disease, or unspecified chronic kidney disease: Secondary | ICD-10-CM | POA: Diagnosis not present

## 2014-10-11 DIAGNOSIS — M7989 Other specified soft tissue disorders: Secondary | ICD-10-CM | POA: Diagnosis not present

## 2014-10-11 DIAGNOSIS — R278 Other lack of coordination: Secondary | ICD-10-CM | POA: Diagnosis not present

## 2014-10-11 DIAGNOSIS — N390 Urinary tract infection, site not specified: Secondary | ICD-10-CM | POA: Diagnosis not present

## 2014-10-11 DIAGNOSIS — K922 Gastrointestinal hemorrhage, unspecified: Secondary | ICD-10-CM | POA: Diagnosis not present

## 2014-10-11 DIAGNOSIS — I872 Venous insufficiency (chronic) (peripheral): Secondary | ICD-10-CM | POA: Diagnosis not present

## 2014-10-11 DIAGNOSIS — I10 Essential (primary) hypertension: Secondary | ICD-10-CM | POA: Diagnosis not present

## 2014-10-11 DIAGNOSIS — J209 Acute bronchitis, unspecified: Secondary | ICD-10-CM | POA: Diagnosis not present

## 2014-10-11 DIAGNOSIS — R05 Cough: Secondary | ICD-10-CM | POA: Diagnosis not present

## 2014-10-11 DIAGNOSIS — D649 Anemia, unspecified: Secondary | ICD-10-CM | POA: Diagnosis present

## 2014-10-11 DIAGNOSIS — I509 Heart failure, unspecified: Secondary | ICD-10-CM | POA: Diagnosis not present

## 2014-10-11 DIAGNOSIS — E568 Deficiency of other vitamins: Secondary | ICD-10-CM | POA: Diagnosis not present

## 2014-10-11 DIAGNOSIS — N181 Chronic kidney disease, stage 1: Secondary | ICD-10-CM | POA: Diagnosis not present

## 2014-10-11 DIAGNOSIS — G4700 Insomnia, unspecified: Secondary | ICD-10-CM | POA: Diagnosis not present

## 2014-10-11 DIAGNOSIS — K5790 Diverticulosis of intestine, part unspecified, without perforation or abscess without bleeding: Secondary | ICD-10-CM | POA: Diagnosis not present

## 2014-10-11 DIAGNOSIS — I5032 Chronic diastolic (congestive) heart failure: Secondary | ICD-10-CM | POA: Diagnosis not present

## 2014-10-11 DIAGNOSIS — F0390 Unspecified dementia without behavioral disturbance: Secondary | ICD-10-CM | POA: Diagnosis not present

## 2014-10-11 DIAGNOSIS — K5900 Constipation, unspecified: Secondary | ICD-10-CM | POA: Diagnosis not present

## 2014-10-11 DIAGNOSIS — R1010 Upper abdominal pain, unspecified: Secondary | ICD-10-CM | POA: Diagnosis not present

## 2014-10-11 LAB — CBC
HCT: 26.9 % — ABNORMAL LOW (ref 36.0–46.0)
Hemoglobin: 8.7 g/dL — ABNORMAL LOW (ref 12.0–15.0)
MCH: 29.6 pg (ref 26.0–34.0)
MCHC: 32.3 g/dL (ref 30.0–36.0)
MCV: 91.5 fL (ref 78.0–100.0)
Platelets: 263 10*3/uL (ref 150–400)
RBC: 2.94 MIL/uL — ABNORMAL LOW (ref 3.87–5.11)
RDW: 13.6 % (ref 11.5–15.5)
WBC: 17 10*3/uL — AB (ref 4.0–10.5)

## 2014-10-11 LAB — RESPIRATORY VIRUS PANEL
Adenovirus: NEGATIVE
INFLUENZA B 1: NEGATIVE
Influenza A: NEGATIVE
METAPNEUMOVIRUS: NEGATIVE
PARAINFLUENZA 2 A: NEGATIVE
PARAINFLUENZA 3 A: NEGATIVE
Parainfluenza 1: NEGATIVE
Respiratory Syncytial Virus A: NEGATIVE
Respiratory Syncytial Virus B: NEGATIVE
Rhinovirus: NEGATIVE

## 2014-10-11 LAB — BASIC METABOLIC PANEL
Anion gap: 6 (ref 5–15)
BUN: 19 mg/dL (ref 6–20)
CHLORIDE: 105 mmol/L (ref 101–111)
CO2: 28 mmol/L (ref 22–32)
CREATININE: 0.73 mg/dL (ref 0.44–1.00)
Calcium: 8.9 mg/dL (ref 8.9–10.3)
GFR calc non Af Amer: >60 mL/min (ref >60)
Glucose, Bld: 124 mg/dL — ABNORMAL HIGH (ref 65–99)
POTASSIUM: 3.9 mmol/L (ref 3.5–5.1)
Sodium: 139 mmol/L (ref 135–145)

## 2014-10-11 MED ORDER — TEMAZEPAM 7.5 MG PO CAPS: 7.5000 mg | ORAL_CAPSULE | Freq: Every evening | ORAL | Status: DC | PRN

## 2014-10-11 MED ORDER — AMOXICILLIN-POT CLAVULANATE 875-125 MG PO TABS
1.0000 | ORAL_TABLET | Freq: Two times a day (BID) | ORAL | Status: DC
  Administered 2014-10-11: 1 via ORAL
  Filled 2014-10-11 (×2): qty 1

## 2014-10-11 MED ORDER — POLYETHYLENE GLYCOL 3350 17 G PO PACK: 17.0000 g | PACK | Freq: Every day | ORAL | Status: DC | PRN

## 2014-10-11 MED ORDER — LEVOFLOXACIN 250 MG PO TABS
250.0000 mg | ORAL_TABLET | Freq: Every day | ORAL | Status: DC
  Administered 2014-10-11: 250 mg via ORAL
  Filled 2014-10-11 (×2): qty 1

## 2014-10-11 MED ORDER — GUAIFENESIN ER 600 MG PO TB12: 600.0000 mg | ORAL_TABLET | Freq: Two times a day (BID) | ORAL | Status: DC

## 2014-10-11 NOTE — Clinical Social Work Placement (Signed)
   CLINICAL SOCIAL WORK PLACEMENT  NOTE  Date:  10/11/2014  Patient Details  Name: Sarah Perkins MRN: [id number] Date of Birth: [birth year]  Clinical Social Work is seeking post-discharge placement for this patient at the [place] level of care (*CSW will initial, date and re-position this form in  chart as items are completed):  No (Recently d/c'd from [facility] and wants to return there.  )   Patient/family provided with [place] Work Department's list of facilities offering this level of care within the geographic area requested by the patient (or if unable, by the patient's family).  No   Patient/family informed of their freedom to choose among providers that offer the needed level of care, that participate in Medicare, Medicaid or managed care program needed by the patient, have an available bed and are willing to accept the patient.  No   Patient/family informed of [place]'s ownership interest in [facility] and [facility], as well as of the fact that they are under no obligation to receive care at these facilities.  PASRR submitted to EDS on       PASRR number received on       Existing PASRR number confirmed on 10/10/14     FL2 transmitted to all facilities in geographic area requested by pt/family on 10/11/14 (Sent to [facility] only- they have a bed for patient)     FL2 transmitted to all facilities within larger geographic area on       Patient informed that his/her managed care company has contracts with or will negotiate with certain facilities, including the following:            Patient/family informed of bed offers received.  Patient chooses bed at [facility]     Physician recommends and patient chooses bed at      Patient to be transferred to [facility] on 10/11/14.  Patient to be transferred to facility by [facility])     Patient family notified on 10/11/14 of transfer.  Name of family member  notified:  Son:  Sarah Perkins     PHYSICIAN Please prepare priority discharge summary, including medications, Please prepare prescriptions, Please sign FL2     Additional Comment: Ok per MD for d/c today to SNF rehab at [facility]. She was a recent patient there 2 weeks ok.  Patient is alert to person only; she responded "yes" when asked if she was agreeable to return to the SNF for more rehab.  Her son [name] is very pleased with d/c plan as he felt that she received excellent care at the facility.  Nursing notified to call report. No further CSW need identified.  CSW signing off.  [name]. Murrell Redden [phone]      _______________________________________________ Williemae Area, LCSW 10/11/2014, 12:35 PM

## 2014-10-11 NOTE — Clinical Social Work Placement (Deleted)
   CLINICAL SOCIAL WORK PLACEMENT  NOTE  Date:  10/11/2014  Patient Details  Name: Sarah Perkins MRN: [id number] Date of Birth: [birth year]  Clinical Social Work is seeking post-discharge placement for this patient at the [place] level of care (*CSW will initial, date and re-position this form in  chart as items are completed):  No (Recently d/c'd from  [facility] and wants to return there.  )   Patient/family provided with [place] Work Department's list of facilities offering this level of care within the geographic area requested by the patient (or if unable, by the patient's family).  No   Patient/family informed of their freedom to choose among providers that offer the needed level of care, that participate in Medicare, Medicaid or managed care program needed by the patient, have an available bed and are willing to accept the patient.  No   Patient/family informed of [place]'s ownership interest in [facility] and [facility], as well as of the fact that they are under no obligation to receive care at these facilities.  PASRR submitted to EDS on       PASRR number received on       Existing PASRR number confirmed on 10/10/14     FL2 transmitted to all facilities in geographic area requested by pt/family on 10/11/14 (Sent to [facility] only- they have a bed for patient)     FL2 transmitted to all facilities within larger geographic area on       Patient informed that his/her managed care company has contracts with or will negotiate with certain facilities, including the following:            Patient/family informed of bed offers received.  Patient [facility] bed [facility]     Physician recommends and patient chooses bed at      Patient to be transferred to [facility] on 10/11/14.  Patient to be transferred to facility by [facility])     Patient family notified on 10/11/14 of transfer.  Name of family member  notified:  Son:  Rama Sorci     PHYSICIAN Please prepare priority discharge summary, including medications, Please prepare prescriptions, Please sign FL2     Additional Comment:    _______________________________________________ Williemae Area, LCSW 10/11/2014, 12:37 PM

## 2014-10-11 NOTE — Clinical Social Work Placement (Signed)
   CLINICAL SOCIAL WORK PLACEMENT  NOTE  Date:  10/11/2014  Patient Details  Name: Sarah Perkins MRN: [id number] Date of Birth: December 23, 1917  Clinical Social Work is seeking post-discharge placement for this patient at the [place] level of care (*CSW will initial, date and re-position this form in  chart as items are completed):  No (Recently d/c'd from [facility] and wants to return there.  )   Patient/family provided with [name] Work Department's list of facilities offering this level of care within the geographic area requested by the patient (or if unable, by the patient's family).  No   Patient/family informed of their freedom to choose among providers that offer the needed level of care, that participate in Medicare, Medicaid or managed care program needed by the patient, have an available bed and are willing to accept the patient.  No   Patient/family informed of [place]'s ownership interest in [facility] and [facility], as well as of the fact that they are under no obligation to receive care at these facilities.  PASRR submitted to EDS on       PASRR number received on       Existing PASRR number confirmed on 10/10/14     FL2 transmitted to all facilities in geographic area requested by pt/family on 10/11/14 (Sent to [facility] only- they have a bed for patient)     FL2 transmitted to all facilities within larger geographic area on       Patient informed that his/her managed care company has contracts with or will negotiate with certain facilities, including the following:            Patient/family informed of bed offers received.  Patient chooses bed at       Physician recommends and patient chooses bed at      Patient to be transferred to   on  .  Patient to be transferred to facility by       Patient family notified on   of transfer.  Name of family member notified:        PHYSICIAN Please prepare  priority discharge summary, including medications, Please prepare prescriptions, Please sign FL2     Additional Comment:    _______________________________________________ Williemae Area, LCSW [phone] 10/09/13  1424 PM

## 2014-10-11 NOTE — Evaluation (Signed)
Physical Therapy Evaluation Patient Details Name: Sarah Perkins MRN: [id number] DOB: 1917/05/30 Today's Date: 10/11/2014   History of Present Illness  81 year old female with past medical history of dementia, hypertension, chronic diastolic CHF, diverticulosis. She presented to [facility] ED from home with cough productive of yellowish sputum over past few days prior to this admission in addition to blood in stool and failure to thrive. She was recently hospitalized in 07/2014 with diverticular bleed which has spontaneously resolved.   Clinical Impression  Pt with flat affect, decreased cognition and memory at baseline. Pt with very limited bil knee flexion with inability to bend knees to assist with rolling and utilized pivot to EOB for transfer. Son present throughout and cueing mother as well as education for transfer technique and sequence of assist. Pt with assist for all mobility at baseline but per son even more dependent today. Pt with generalized weakness and fatigue who will benefit from trial of acute therapy to maximize transfers, balance and function to decrease burden of care.     Follow Up Recommendations SNF;Supervision/Assistance - 24 hour    Equipment Recommendations  None recommended by PT    Recommendations for Other Services       Precautions / Restrictions Precautions Precautions: Fall      Mobility  Bed Mobility Overal bed mobility: Needs Assistance Bed Mobility: Supine to Sit     Supine to sit: Max assist     General bed mobility comments: max cues and assist to pivot lower body to side of bed, mod assist to elevate trunk and max assist to scoot pelvis to edge. Initial balance mod assist with improvement to minguard  Transfers Overall transfer level: Needs assistance   Transfers: Sit to/from Stand;Stand Pivot Transfers Sit to Stand: Mod assist;Max assist;+2 physical assistance Stand pivot transfers: +2 physical assistance;Mod assist       General  transfer comment: pt able to stand from bed mod assist with gait belt, assist for anterior translation and elevation from higher surface. pt able to pivot to chair with mod assist with cues and facilitation of pelvis with belt. Additional stand from chair 2 person max assist due to pt lack of cognition with posterior lean. Max mulitmodal cues and total assist of 2 to scoot fully back in chair  Ambulation/Gait                Stairs            Wheelchair Mobility    Modified Rankin (Stroke Patients Only)       Balance Overall balance assessment: Needs assistance   Sitting balance-Leahy Scale: Poor       Standing balance-Leahy Scale: Zero                               Pertinent Vitals/Pain Pain Assessment: 0-10 Pain Score: 4  Pain Location: legs sore to touch Pain Descriptors / Indicators: Sore Pain Intervention(s): Repositioned  95% on RA    Home Living Family/patient expects to be discharged to:: Private residence Living Arrangements: Children Available Help at Discharge: Family;Available 24 hours/day Type of Home: House Home Access: Stairs to enter   [employer] of Steps: 2 Home Layout: One level Home Equipment: Bedside commode;Toilet riser;Wheelchair - Insurance claims handler - 2 wheels;Walker - 4 wheels Additional Comments: Has been living with son for 3 weeks since D/C from SNF but he states he plans for SNF at D/C    Prior  Function Level of Independence: Needs assistance   Gait / Transfers Assistance Needed: Mainly uses wheelchair for mobility.  Uses RW walking into the bathroom only with mod assist due to posterior lean  ADL's / Homemaking Assistance Needed: max assist for bathing and dressing is able to self feed        Hand Dominance        Extremity/Trunk Assessment   Upper Extremity Assessment: Generalized weakness           Lower Extremity Assessment: Generalized weakness (pt with very limited bil knee  flexion less than 30 degrees which son reports as baseline)      Cervical / Trunk Assessment: Normal  Communication   Communication: HOH  Cognition Arousal/Alertness: Awake/alert Behavior During Therapy: Flat affect Overall Cognitive Status: History of cognitive impairments - at baseline                      General Comments      Exercises        Assessment/Plan    PT Assessment Patient needs continued PT services  PT Diagnosis Difficulty walking;Generalized weakness;Altered mental status   PT Problem List Decreased strength;Decreased cognition;Decreased activity tolerance;Decreased range of motion;Decreased knowledge of use of DME;Decreased balance;Decreased safety awareness;Decreased mobility  PT Treatment Interventions DME instruction;Functional mobility training;Therapeutic activities;Therapeutic exercise;Balance training;Patient/family education   PT Goals (Current goals can be found in the Care Plan section) Acute Rehab PT Goals Patient Stated Goal: son wants pt to be stronger with mobility prior to home PT Goal Formulation: With family Time For Goal Achievement: 10/25/14 Potential to Achieve Goals: Fair    Frequency Min 2X/week   Barriers to discharge Decreased caregiver support      Co-evaluation               End of Session Equipment Utilized During Treatment: Gait belt Activity Tolerance: Patient tolerated treatment well Patient left: in chair;with call bell/phone within reach;with chair alarm set;with family/visitor present Nurse Communication: Mobility status;Precautions         Time: 3143-8887 PT Time Calculation (min) (ACUTE ONLY): 23 min   Charges:   PT Evaluation $Initial PT Evaluation Tier I: 1 Procedure PT Treatments $Therapeutic Activity: 8-22 mins   PT G CodesMelford Aase 10/11/2014, 11:04 AM Elwyn Reach, [place]

## 2014-10-11 NOTE — Discharge Summary (Addendum)
Physician Discharge Summary  Sarah Perkins VQQ:[id number] DOB: [birth year] DOA: 10/08/2014  PCP: Thressa Sheller, MD  Admit date: 10/08/2014 Discharge date: 10/11/2014  Time spent: 40 minutes  Recommendations for Outpatient Follow-up:  1. Follow-up with the nursing home M.D. in one week. 2. MiraLAX added along with Senokot, if patient developed diarrhea please hold. 3. Augmentin for 3 more days, please use flutter valve to induce expectoration. 4. Patient is full code, palliative care recommended to follow in nursing home for further discussion on goals of care.  Discharge Diagnoses:  Principal Problem:   GI bleed Active Problems:   HTN (hypertension)   CKD (chronic kidney disease), stage I   GERD (gastroesophageal reflux disease)   Diverticulosis   Dementia   Chronic diastolic CHF (congestive heart failure)   Acute bronchitis   Anemia   CN (constipation)   Cough   Palliative care encounter   Discharge Condition: Stable  Diet recommendation: Heart healthy  Filed Weights   10/09/14 0542 10/10/14 0551 10/11/14 0311  Weight: 60.056 kg (132 lb 6.4 oz) 59.998 kg (132 lb 4.4 oz) 60.147 kg (132 lb 9.6 oz)    History of present illness:  This is a 81 year old female patient with underlying history of dementia, hypertension, diverticulosis with prior GI bleeding, chronic diastolic heart failure. Was discharged on 08/24/14 after being treated for recurrent diverticular rectal bleeding without gross bright red blood. She was treated with conservative management and bleeding resolved spontaneously and gastroenterology was therefore not consulted. She apparently was discharged to a skilled nursing facility after that admission. She returns today with her son to the ER with multiple complaints: She has been expressing a productive cough for 3-4 days and the son has noticed blood in her stools beginning on 6/10 and has noticed the patient has had decreased oral intake over the past  several days. Son also reported that the patient has also had worsening urinary incontinence and been complaining of generalized weakness for several days. In discussing with the patient she has denied any symptoms although she is aware of the cough and reports she couldn't sleep last night because of the cough.  In the ER patient was afebrile and actually hypertensive with room air sats varying between 90-95%. She was noted to have a productive wet cough. Rest x-ray was unremarkable. Electrolyte panel was within normal limits except for albumen 3.2 and glucose 106. BNP was 98, troponin 0.00. Lactic acid was mildly elevated at 2.01. She did have minimal leukocytosis with a white count of 10,600 with normal neutrophil count. Hemoglobin was just slightly lower than discharge hemoglobin in April of 9.3 with current hemoglobin 9.0. Urinalysis was essentially unremarkable except for moderate hemoglobin and no evidence of UTI. EDP perform digital rectal exam with dark stools noted which were heme positive. My interview the patient she reiterated the same symptoms. No family at bedside and patient presently admits that she can't always remember things that have occurred and I would need to ask her son. She did remember that she coughs up yellow sputum.  Hospital Course:   Lower GI bleed Likely diverticular bleed, patient has heme positive stool in the emergency department. Has known history of diverticulosis and previous diverticular bleeds, this is likely similar. Patient is not candidate for invasive intervention, we will conservatively manage her. Bleeding appears to be stopped on its own, no reported bleeding. MiraLAX added to Senokot to avoid constipation which can cause bleeding with diverticulosis. No bleeding or bowel movement for the past 2 days.  Acute blood loss anemia Mild acute blood loss anemia, hemoglobin baseline 9.3 in April, presented with hemoglobin of 9 today. Hemoglobin dropped down to  8.4, hemodilution with IV fluids also might be contributing. We'll transfuse if hemoglobin drops less than 7.5. Hemoglobin on day of discharge is 8.7.  Acute bronchitis Patient has cough, shortness of breath, sputum production and no findings on x-ray. Symptoms consistent with acute bronchitis. Supportive treatment with Mucinex, Robitussin and oxygen as needed. Discharge on Augmentin for 3 more days.  Chronic diastolic CHF Compensated CHF, 2-D echo back in 2013 showed preserved LVEF.  Essential hypertension Continue home medications.  Goals of care Palliative care consulted as patient still full code, they saw the patient and met with his son. Patient continues to be full code, patient has consistent decline in cognitive function.   Procedures:  None  Consultations:  Palliative medicine team  Discharge Exam: Filed Vitals:   10/11/14 0311  BP: 137/54  Pulse: 89  Temp:   Resp: 17   General: Alert and awake, oriented x3, not in any acute distress. HEENT: anicteric sclera, pupils reactive to light and accommodation, EOMI CVS: S1-S2 clear, no murmur rubs or gallops Chest: clear to auscultation bilaterally, no wheezing, rales or rhonchi Abdomen: soft nontender, nondistended, normal bowel sounds, no organomegaly Extremities: no cyanosis, clubbing or edema noted bilaterally Neuro: Cranial nerves II-XII intact, no focal neurological deficits  Discharge Instructions   Discharge Instructions    Diet - low sodium heart healthy    Complete by:  As directed      Increase activity slowly    Complete by:  As directed           Current Discharge Medication List    START taking these medications   Details  guaiFENesin (MUCINEX) 600 MG 12 hr tablet Take 1 tablet (600 mg total) by mouth 2 (two) times daily.    polyethylene glycol (MIRALAX / GLYCOLAX) packet Take 17 g by mouth daily as needed for mild constipation. Qty: 14 each, Refills: 0      CONTINUE these medications  which have CHANGED   Details  temazepam (RESTORIL) 7.5 MG capsule Take 1 capsule (7.5 mg total) by mouth at bedtime as needed for sleep. Qty: 10 capsule, Refills: 0      CONTINUE these medications which have NOT CHANGED   Details  amLODipine (NORVASC) 5 MG tablet Take 5 mg by mouth daily.    Ascorbic Acid (VITAMIN C) 100 MG tablet Take 100 mg by mouth daily. With rose hips 500 mg    cholecalciferol (VITAMIN D) 1000 UNITS tablet Take 1,000 Units by mouth daily.    docusate sodium (COLACE) 100 MG capsule Take 100 mg by mouth at bedtime.    ferrous sulfate 325 (65 FE) MG tablet Take 325 mg by mouth daily with breakfast.    furosemide (LASIX) 20 MG tablet Take 10 mg by mouth daily.     guaifenesin (ROBITUSSIN) 100 MG/5ML syrup Take 200 mg by mouth 3 (three) times daily as needed for cough.    Ibuprofen-Diphenhydramine HCl (ADVIL PM) 200-25 MG CAPS Take 1 capsule by mouth at bedtime as needed (Sleep/pain).    Multiple Minerals (CALCIUM-MAGNESIUM-ZINC) TABS Take 1 tablet by mouth daily.    Pregabalin (LYRICA PO) Take by mouth.       No Known Allergies Follow-up Information    Follow up with Thressa Sheller, MD In 1 week.   Specialty:  Internal Medicine   Contact information:   [address],  [address] [place] [address] [phone]        The results of significant diagnostics from this hospitalization (including imaging, microbiology, ancillary and laboratory) are listed below for reference.    Significant Diagnostic Studies: Dg Chest 2 View  10/08/2014   CLINICAL DATA:  Cough for 1 week  EXAM: CHEST - 2 VIEW  COMPARISON:  01/28/2013  FINDINGS: Cardiac shadow is stable. Aortic calcifications are noted. The lungs are clear bilaterally. No acute bony abnormality is seen. Small hiatal hernia is noted.  IMPRESSION: No active disease.   Electronically Signed   By: Inez Catalina M.D.   On: 10/08/2014 10:56    Microbiology: Recent Results (from the past 240 hour(s))   MRSA PCR Screening     Status: None   Collection Time: 10/09/14  5:30 AM  Result Value Ref Range Status   MRSA by PCR NEGATIVE NEGATIVE Final    Comment:        The GeneXpert MRSA Assay (FDA approved for NASAL specimens only), is one component of a comprehensive MRSA colonization surveillance program. It is not intended to diagnose MRSA infection nor to guide or monitor treatment for MRSA infections.      Labs: Basic Metabolic Panel:  Recent Labs Lab 10/08/14 1025 10/08/14 1125 10/09/14 0037 10/10/14 0400 10/11/14 0356  NA 138 135 135 139 139  K 4.2 4.7 3.4* 3.7 3.9  CL 101 101 103 105 105  CO2 '25 25 25 26 28  ' GLUCOSE 112* 106* 103* 106* 124*  BUN '18 19 14 14 19  ' CREATININE 0.74 0.72 0.69 0.63 0.73  CALCIUM 9.5 9.5 8.5* 8.7* 8.9   Liver Function Tests:  Recent Labs Lab 10/08/14 1025 10/08/14 1125  AST 31 39  ALT 10* 8*  ALKPHOS 46 46  BILITOT 0.9 1.2  PROT 8.3* 8.0  ALBUMIN 3.0* 3.2*    Recent Labs Lab 10/08/14 1025  LIPASE 11*   No results for input(s): AMMONIA in the last 168 hours. CBC:  Recent Labs Lab 10/08/14 1215 10/09/14 0037 10/10/14 0400 10/11/14 0356  WBC 10.6* 8.9 12.2* 17.0*  NEUTROABS 7.1  --   --   --   HGB 9.0* 8.4* 9.4* 8.7*  HCT 27.0* 25.4* 28.5* 26.9*  MCV 90.0 90.7 90.8 91.5  PLT 275 254 286 263   Cardiac Enzymes: No results for input(s): CKTOTAL, CKMB, CKMBINDEX, TROPONINI in the last 168 hours. BNP: BNP (last 3 results)  Recent Labs  10/08/14 1025  BNP 98.5    ProBNP (last 3 results) No results for input(s): PROBNP in the last 8760 hours.  CBG: No results for input(s): GLUCAP in the last 168 hours.     Signed:  Burlon Centrella A  [facility] 10/11/2014, 9:48 AM

## 2014-10-12 DIAGNOSIS — R627 Adult failure to thrive: Secondary | ICD-10-CM | POA: Diagnosis not present

## 2014-10-12 DIAGNOSIS — I509 Heart failure, unspecified: Secondary | ICD-10-CM | POA: Diagnosis not present

## 2014-10-12 DIAGNOSIS — K922 Gastrointestinal hemorrhage, unspecified: Secondary | ICD-10-CM | POA: Diagnosis not present

## 2014-10-12 DIAGNOSIS — J209 Acute bronchitis, unspecified: Secondary | ICD-10-CM | POA: Diagnosis not present

## 2014-10-12 DIAGNOSIS — N390 Urinary tract infection, site not specified: Secondary | ICD-10-CM | POA: Diagnosis not present

## 2014-10-12 DIAGNOSIS — D649 Anemia, unspecified: Secondary | ICD-10-CM | POA: Diagnosis not present

## 2014-10-28 ENCOUNTER — Other Ambulatory Visit (HOSPITAL_COMMUNITY): Payer: Self-pay | Admitting: *Deleted

## 2014-11-01 ENCOUNTER — Ambulatory Visit (HOSPITAL_COMMUNITY)
Admission: RE | Admit: 2014-11-01 | Discharge: 2014-11-01 | Disposition: A | Discharge status: Home / Self Care | Payer: No Typology Code available for payment source | Source: Ambulatory Visit | Attending: Geriatric Medicine | Admitting: Geriatric Medicine

## 2014-11-01 DIAGNOSIS — N181 Chronic kidney disease, stage 1: Secondary | ICD-10-CM | POA: Insufficient documentation

## 2014-11-01 DIAGNOSIS — D649 Anemia, unspecified: Secondary | ICD-10-CM | POA: Diagnosis not present

## 2014-11-01 DIAGNOSIS — K219 Gastro-esophageal reflux disease without esophagitis: Secondary | ICD-10-CM | POA: Insufficient documentation

## 2014-11-01 DIAGNOSIS — I509 Heart failure, unspecified: Secondary | ICD-10-CM | POA: Diagnosis not present

## 2014-11-01 DIAGNOSIS — K5790 Diverticulosis of intestine, part unspecified, without perforation or abscess without bleeding: Secondary | ICD-10-CM | POA: Diagnosis not present

## 2014-11-01 DIAGNOSIS — I129 Hypertensive chronic kidney disease with stage 1 through stage 4 chronic kidney disease, or unspecified chronic kidney disease: Secondary | ICD-10-CM | POA: Insufficient documentation

## 2014-11-01 LAB — PREPARE RBC (CROSSMATCH)

## 2014-11-01 MED ORDER — SODIUM CHLORIDE 0.9 % IV SOLN
Freq: Once | INTRAVENOUS | Status: AC
  Administered 2014-11-01: 12:00:00 via INTRAVENOUS

## 2014-11-01 MED ORDER — FUROSEMIDE 10 MG/ML IJ SOLN
INTRAMUSCULAR | Status: AC
  Administered 2014-11-01: 20 mg
  Filled 2014-11-01: qty 2

## 2014-11-01 MED ORDER — FUROSEMIDE 10 MG/ML IJ SOLN: 20.0000 mg | Freq: Once | INTRAMUSCULAR | Status: DC

## 2014-11-02 DIAGNOSIS — D649 Anemia, unspecified: Secondary | ICD-10-CM | POA: Diagnosis not present

## 2014-11-02 DIAGNOSIS — I10 Essential (primary) hypertension: Secondary | ICD-10-CM | POA: Diagnosis not present

## 2014-11-02 DIAGNOSIS — I872 Venous insufficiency (chronic) (peripheral): Secondary | ICD-10-CM | POA: Diagnosis not present

## 2014-11-02 DIAGNOSIS — F0390 Unspecified dementia without behavioral disturbance: Secondary | ICD-10-CM | POA: Diagnosis not present

## 2014-11-02 LAB — TYPE AND SCREEN
ABO/RH(D): B POS
Antibody Screen: NEGATIVE
Donor AG Type: NEGATIVE
Unit division: 0

## 2014-11-15 DIAGNOSIS — I5032 Chronic diastolic (congestive) heart failure: Secondary | ICD-10-CM | POA: Diagnosis not present

## 2014-11-15 DIAGNOSIS — I129 Hypertensive chronic kidney disease with stage 1 through stage 4 chronic kidney disease, or unspecified chronic kidney disease: Secondary | ICD-10-CM | POA: Diagnosis not present

## 2014-11-15 DIAGNOSIS — F0390 Unspecified dementia without behavioral disturbance: Secondary | ICD-10-CM | POA: Diagnosis not present

## 2014-11-15 DIAGNOSIS — N189 Chronic kidney disease, unspecified: Secondary | ICD-10-CM | POA: Diagnosis not present

## 2014-11-15 DIAGNOSIS — M6281 Muscle weakness (generalized): Secondary | ICD-10-CM | POA: Diagnosis not present

## 2014-11-15 DIAGNOSIS — I872 Venous insufficiency (chronic) (peripheral): Secondary | ICD-10-CM | POA: Diagnosis not present

## 2014-11-18 DIAGNOSIS — I129 Hypertensive chronic kidney disease with stage 1 through stage 4 chronic kidney disease, or unspecified chronic kidney disease: Secondary | ICD-10-CM | POA: Diagnosis not present

## 2014-11-18 DIAGNOSIS — F0390 Unspecified dementia without behavioral disturbance: Secondary | ICD-10-CM | POA: Diagnosis not present

## 2014-11-18 DIAGNOSIS — N189 Chronic kidney disease, unspecified: Secondary | ICD-10-CM | POA: Diagnosis not present

## 2014-11-18 DIAGNOSIS — M6281 Muscle weakness (generalized): Secondary | ICD-10-CM | POA: Diagnosis not present

## 2014-11-18 DIAGNOSIS — I5032 Chronic diastolic (congestive) heart failure: Secondary | ICD-10-CM | POA: Diagnosis not present

## 2014-11-18 DIAGNOSIS — I872 Venous insufficiency (chronic) (peripheral): Secondary | ICD-10-CM | POA: Diagnosis not present

## 2014-11-22 DIAGNOSIS — F0390 Unspecified dementia without behavioral disturbance: Secondary | ICD-10-CM | POA: Diagnosis not present

## 2014-11-22 DIAGNOSIS — I872 Venous insufficiency (chronic) (peripheral): Secondary | ICD-10-CM | POA: Diagnosis not present

## 2014-11-22 DIAGNOSIS — I5032 Chronic diastolic (congestive) heart failure: Secondary | ICD-10-CM | POA: Diagnosis not present

## 2014-11-22 DIAGNOSIS — I129 Hypertensive chronic kidney disease with stage 1 through stage 4 chronic kidney disease, or unspecified chronic kidney disease: Secondary | ICD-10-CM | POA: Diagnosis not present

## 2014-11-22 DIAGNOSIS — N189 Chronic kidney disease, unspecified: Secondary | ICD-10-CM | POA: Diagnosis not present

## 2014-11-22 DIAGNOSIS — M6281 Muscle weakness (generalized): Secondary | ICD-10-CM | POA: Diagnosis not present

## 2014-11-25 DIAGNOSIS — I872 Venous insufficiency (chronic) (peripheral): Secondary | ICD-10-CM | POA: Diagnosis not present

## 2014-11-25 DIAGNOSIS — N189 Chronic kidney disease, unspecified: Secondary | ICD-10-CM | POA: Diagnosis not present

## 2014-11-25 DIAGNOSIS — F0390 Unspecified dementia without behavioral disturbance: Secondary | ICD-10-CM | POA: Diagnosis not present

## 2014-11-25 DIAGNOSIS — I5032 Chronic diastolic (congestive) heart failure: Secondary | ICD-10-CM | POA: Diagnosis not present

## 2014-11-25 DIAGNOSIS — M6281 Muscle weakness (generalized): Secondary | ICD-10-CM | POA: Diagnosis not present

## 2014-11-25 DIAGNOSIS — I129 Hypertensive chronic kidney disease with stage 1 through stage 4 chronic kidney disease, or unspecified chronic kidney disease: Secondary | ICD-10-CM | POA: Diagnosis not present

## 2014-11-29 DIAGNOSIS — I5032 Chronic diastolic (congestive) heart failure: Secondary | ICD-10-CM | POA: Diagnosis not present

## 2014-11-29 DIAGNOSIS — N189 Chronic kidney disease, unspecified: Secondary | ICD-10-CM | POA: Diagnosis not present

## 2014-11-29 DIAGNOSIS — I872 Venous insufficiency (chronic) (peripheral): Secondary | ICD-10-CM | POA: Diagnosis not present

## 2014-11-29 DIAGNOSIS — F0390 Unspecified dementia without behavioral disturbance: Secondary | ICD-10-CM | POA: Diagnosis not present

## 2014-11-29 DIAGNOSIS — I129 Hypertensive chronic kidney disease with stage 1 through stage 4 chronic kidney disease, or unspecified chronic kidney disease: Secondary | ICD-10-CM | POA: Diagnosis not present

## 2014-11-29 DIAGNOSIS — M6281 Muscle weakness (generalized): Secondary | ICD-10-CM | POA: Diagnosis not present

## 2014-12-01 DIAGNOSIS — I129 Hypertensive chronic kidney disease with stage 1 through stage 4 chronic kidney disease, or unspecified chronic kidney disease: Secondary | ICD-10-CM | POA: Diagnosis not present

## 2014-12-01 DIAGNOSIS — N189 Chronic kidney disease, unspecified: Secondary | ICD-10-CM | POA: Diagnosis not present

## 2014-12-01 DIAGNOSIS — I5032 Chronic diastolic (congestive) heart failure: Secondary | ICD-10-CM | POA: Diagnosis not present

## 2014-12-01 DIAGNOSIS — F0390 Unspecified dementia without behavioral disturbance: Secondary | ICD-10-CM | POA: Diagnosis not present

## 2014-12-01 DIAGNOSIS — M6281 Muscle weakness (generalized): Secondary | ICD-10-CM | POA: Diagnosis not present

## 2014-12-01 DIAGNOSIS — I872 Venous insufficiency (chronic) (peripheral): Secondary | ICD-10-CM | POA: Diagnosis not present

## 2014-12-02 IMAGING — XA IR ANGIO/VISCERAL SELECTIVE EA VESSEL WO/W FLUSH
1 series · 16 of 24 positions shown · non-contrast
Comparison: none

INDICATION: Acute lower GI bleed, tagged red blood cell scan
performed earlier same day demonstrates acute bleeding apparently
centered within the distal ascending colon/hepatic flexure.  The
patient has history of diverticular disease and several prior
episodes of lower GI bleed
TECHNIQUE: Informed written consent was obtained from the patient and the
patient's son after a discussion of the risks, benefits and
alternatives to treatment.  Questions regarding the procedure were
encouraged and answered.  A timeout was performed prior to the
initiation of the procedure.

[Series 1: run · 16 of 68 slices shown]
[im 1/68]
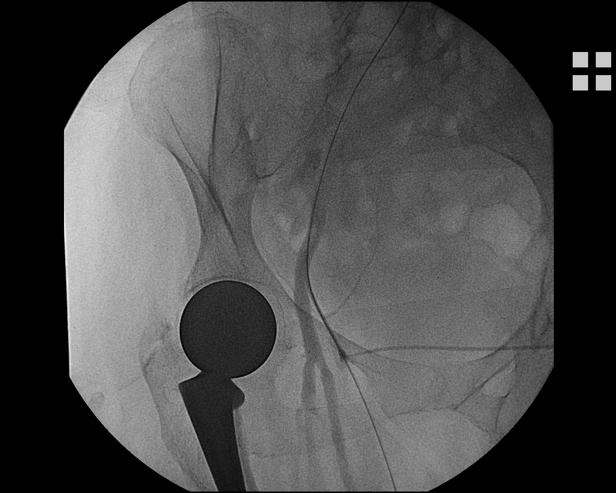
[im 6/68]
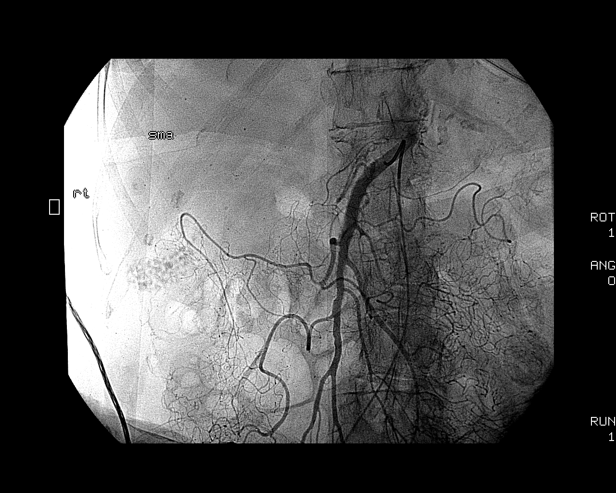
[im 9/68]
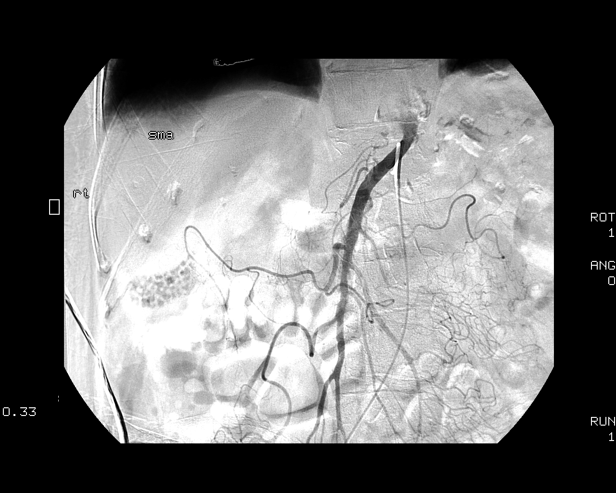
[im 15/68]
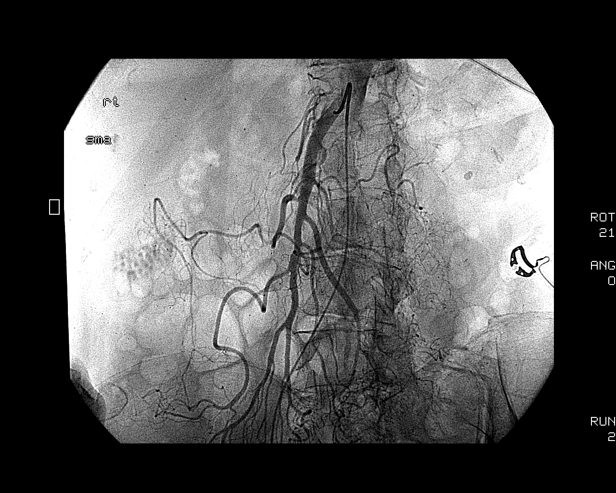
[im 18/68]
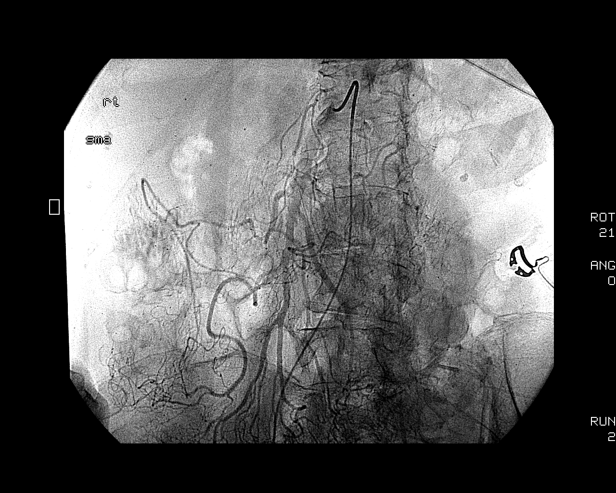
[im 24/68]
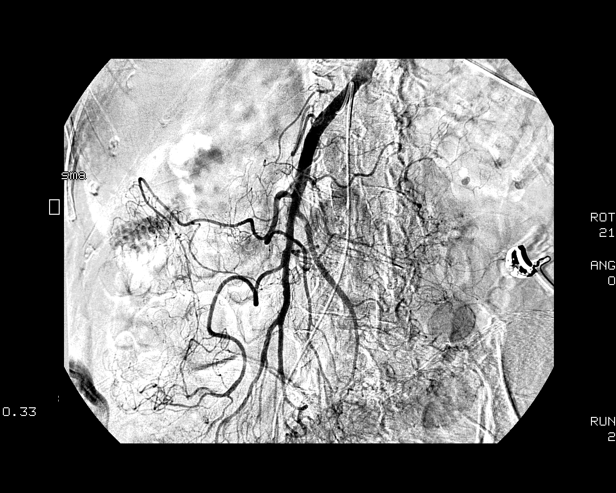
[im 27/68]
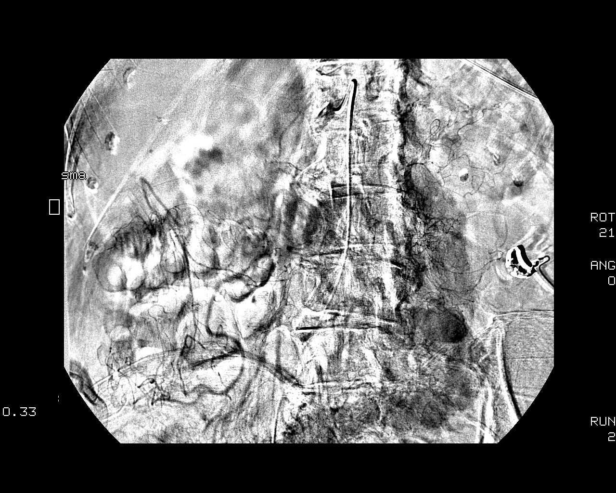
[im 33/68]
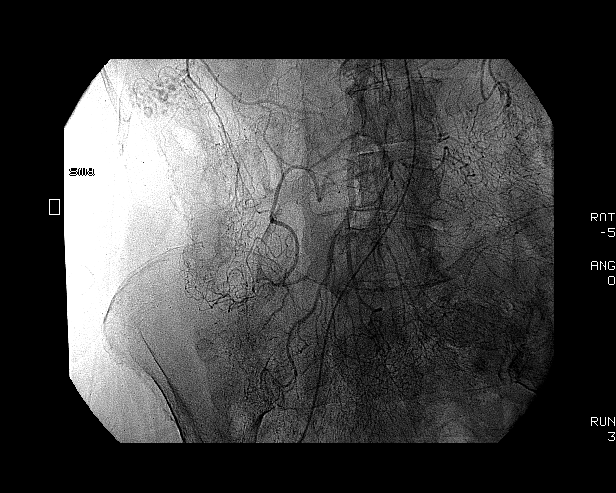
[im 35/68]
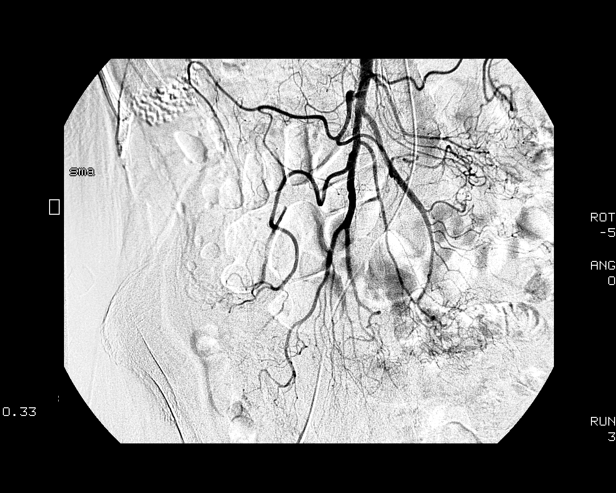
[im 41/68]
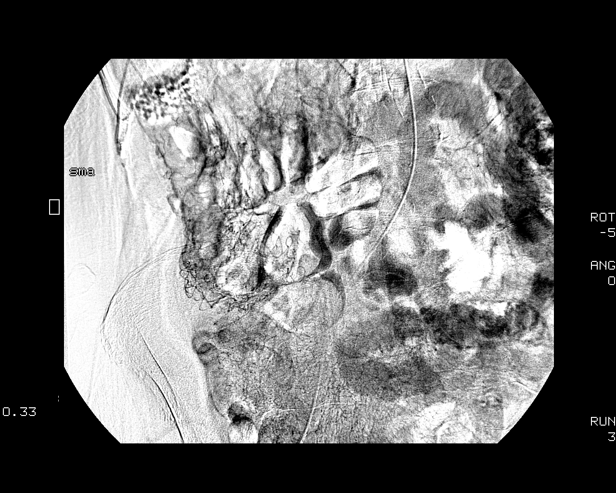
[im 44/68]
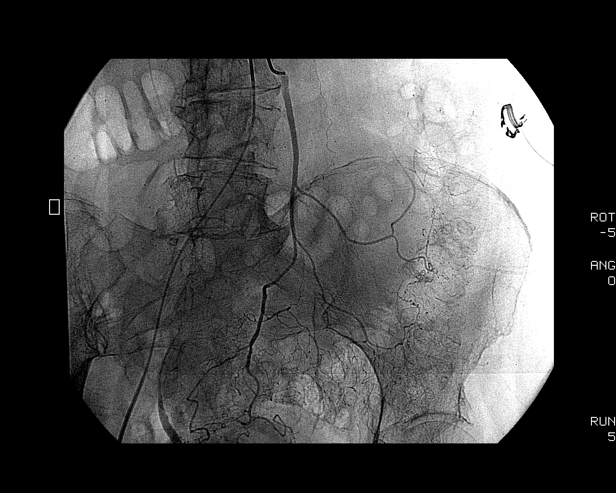
[im 50/68]
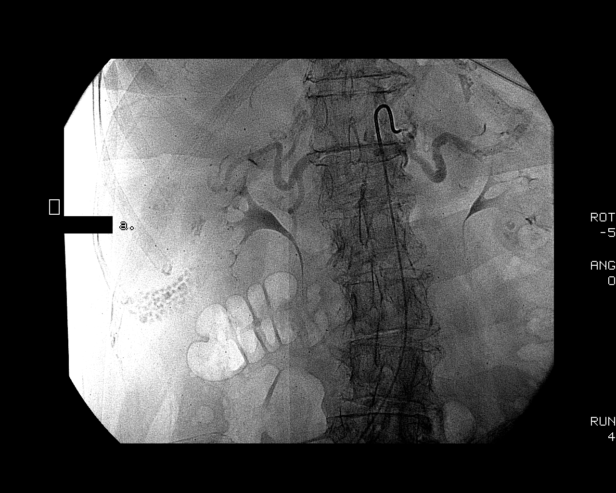
[im 53/68]
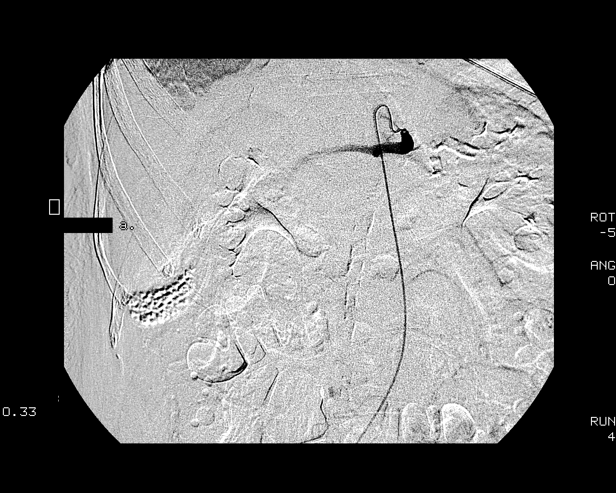
[im 59/68]
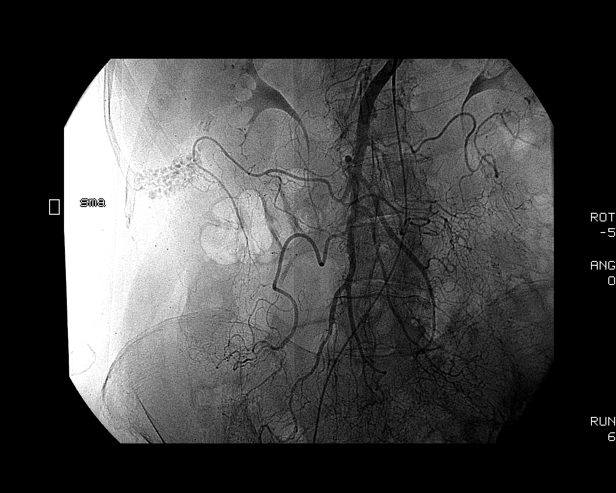
[im 62/68]
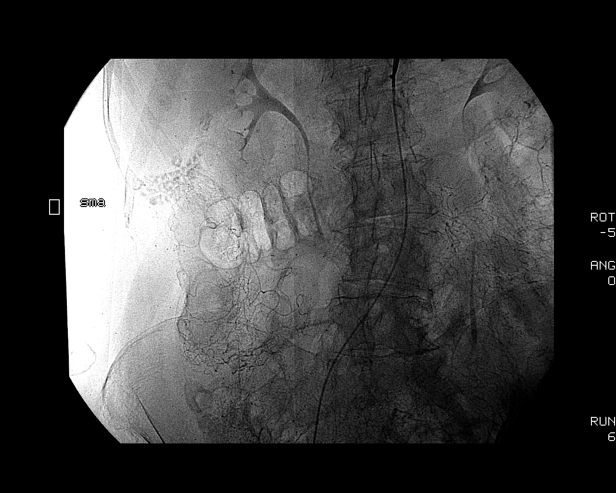
[im 68/68]
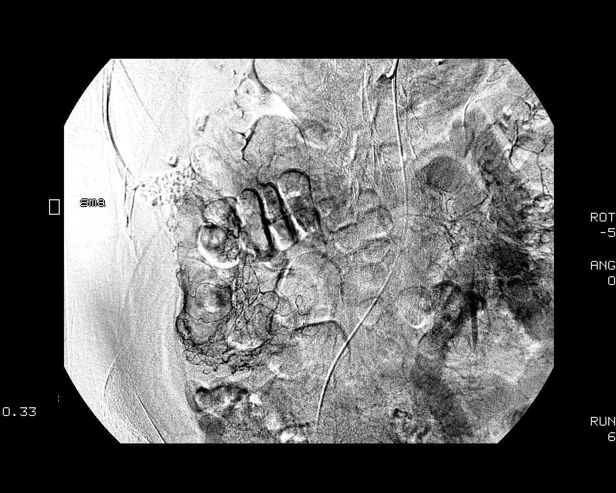

[16 of 24 positions shown; findings below may reference images not displayed]

MESENTERIC ARTERIOGRAM - SELECTIVE ANGIOGRAMS OF THE CELIAC, SMA
AND IMA.

Comparisons: Nuclear medicine tagged red blood cell scan - earlier
same date; 09/20/2011; 08/13/2009; mesenteric arteriogram -
08/22/2009

Intravenous Medications: Fentanyl 50 mcg IV; Versed 0.5 mg IV

Contrast: 120 ml Hmnipaque-FMM

Total Moderate Sedation Time: 35 minutes

Fluoroscopy Time: 7.4 minutes.

Complications: None immediate
The right groin was prepped and draped in the usual sterile
fashion, and a sterile drape was applied covering the operative
field.  Maximum barrier sterile technique with sterile gowns and
gloves were used for the procedure.  A timeout was performed prior
to the initiation of the procedure.  Local anesthesia was provided
with 1% lidocaine.

The right femoral head was marked fluoroscopically.  Under
ultrasound guidance, the right common femoral artery was accessed
with a micropuncture kit after the overlying soft tissues were
anesthetized with 1% lidocaine.  An ultrasound image was saved for
documentation purposes.  The micropuncture sheath was exchanged for
a 5 French vascular sheath over a Bentson wire.  A closure
arteriogram was performed through the side of the sheath confirming
access within the right common femoral artery.

Over a Bentson wire, a Sos catheter was advanced to the level of
the superior abdominal aorta where it was back bled and flushed.
The catheter was then utilized to select the superior mesenteric
artery.  Several superior mesenteric arteriograms were performed in
various obliquities.

The Sos catheter was advanced cranially and utilized to select the
celiac artery.  A celiac arteriogram was performed.

The Sos catheter is then utilized to select the inferior mesenteric
artery.  An inferior mesenteric arteriogram was performed.

Finally, the Sos catheter was again utilized to select the superior
mesenteric arterial artery and an additional superior mesenteric
arteriogram was performed.

All images were reviewed and the procedure was terminated.  All
wires, catheters and sheaths were removed from the patient.
Hemostasis was achieved at the right groin access site with
deployment of an Exoseal closure device.  A dressing was placed.
The patient tolerated well without immediate postprocedural
complication.
FINDINGS: Multiple superior mesenteric arteriograms were performed in various
obliquities (including a delayed repeat superior mesenteric
arteriogram) centered over the area of interest within the distal
aspect of the ascending colon/hepatic flexure at the presumed to
site of active bleeding demonstrated on preprocedural tagged red
blood cell scan, however was negative for discrete area of active
extravasation of vessel irregularity.

Selective arteriograms of the celiac and IMA was also negative for
discrete area of active extravasation or vessel irregularity.
IMPRESSION: Negative mesenteric arteriogram with special attention paid to the
assumed area of active bleeding within the distal aspect of the
ascending colon/hepatic flexure suggested on preprocedural tagged
red blood cell scan.  No embolization performed.

## 2014-12-02 IMAGING — NM NM GI BLOOD LOSS
1 series · 6 of 6 positions shown · non-contrast
Comparison: Nuclear medicine GI bleeding study 09/20/2011.

CLINICAL DATA: Hematochezia.

NUCLEAR MEDICINE GASTROINTESTINAL BLEEDING STUDY
TECHNIQUE: Sequential abdominal images were obtained following
intravenous administration of 8c-UUm labeled red blood cells.
Radiopharmaceutical: NDAYGGY KJ ULTRATAG TECHNETIUM TC 99M-
LABELED RED BLOOD CELLS IV KIT

[Series 0: gi bleed · 4.66mm/px · 6 of 57 frames shown]
[frame 5/57]
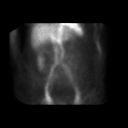
[frame 15/57]
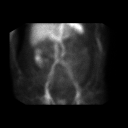
[frame 24/57]
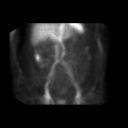
[frame 34/57]
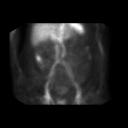
[frame 43/57]
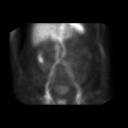
[frame 53/57]
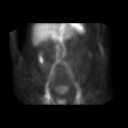

[6 of 6 positions shown; findings below may reference images not displayed]

FINDINGS: Early uptake is present within the descending colon by 5
minutes.  There is rapid transit to the sigmoid colon by 15
minutes.  The uptake at the ascending colon continues to
concentrate.
IMPRESSION: 1.  Early uptake in the descending colon with rapid transit to the
sigmoid colon is compatible with an active colonic hemorrhage
within the ascending colon.

## 2014-12-05 DIAGNOSIS — M810 Age-related osteoporosis without current pathological fracture: Secondary | ICD-10-CM | POA: Diagnosis not present

## 2014-12-05 DIAGNOSIS — I10 Essential (primary) hypertension: Secondary | ICD-10-CM | POA: Diagnosis not present

## 2014-12-06 DIAGNOSIS — N189 Chronic kidney disease, unspecified: Secondary | ICD-10-CM | POA: Diagnosis not present

## 2014-12-06 DIAGNOSIS — F0390 Unspecified dementia without behavioral disturbance: Secondary | ICD-10-CM | POA: Diagnosis not present

## 2014-12-06 DIAGNOSIS — M6281 Muscle weakness (generalized): Secondary | ICD-10-CM | POA: Diagnosis not present

## 2014-12-06 DIAGNOSIS — I5032 Chronic diastolic (congestive) heart failure: Secondary | ICD-10-CM | POA: Diagnosis not present

## 2014-12-06 DIAGNOSIS — I129 Hypertensive chronic kidney disease with stage 1 through stage 4 chronic kidney disease, or unspecified chronic kidney disease: Secondary | ICD-10-CM | POA: Diagnosis not present

## 2014-12-06 DIAGNOSIS — I872 Venous insufficiency (chronic) (peripheral): Secondary | ICD-10-CM | POA: Diagnosis not present

## 2014-12-09 DIAGNOSIS — I872 Venous insufficiency (chronic) (peripheral): Secondary | ICD-10-CM | POA: Diagnosis not present

## 2014-12-09 DIAGNOSIS — I5032 Chronic diastolic (congestive) heart failure: Secondary | ICD-10-CM | POA: Diagnosis not present

## 2014-12-09 DIAGNOSIS — M6281 Muscle weakness (generalized): Secondary | ICD-10-CM | POA: Diagnosis not present

## 2014-12-09 DIAGNOSIS — F0390 Unspecified dementia without behavioral disturbance: Secondary | ICD-10-CM | POA: Diagnosis not present

## 2014-12-09 DIAGNOSIS — N189 Chronic kidney disease, unspecified: Secondary | ICD-10-CM | POA: Diagnosis not present

## 2014-12-09 DIAGNOSIS — I129 Hypertensive chronic kidney disease with stage 1 through stage 4 chronic kidney disease, or unspecified chronic kidney disease: Secondary | ICD-10-CM | POA: Diagnosis not present

## 2014-12-12 DIAGNOSIS — N183 Chronic kidney disease, stage 3 (moderate): Secondary | ICD-10-CM | POA: Diagnosis not present

## 2014-12-12 DIAGNOSIS — I129 Hypertensive chronic kidney disease with stage 1 through stage 4 chronic kidney disease, or unspecified chronic kidney disease: Secondary | ICD-10-CM | POA: Diagnosis not present

## 2014-12-12 DIAGNOSIS — Z23 Encounter for immunization: Secondary | ICD-10-CM | POA: Diagnosis not present

## 2014-12-12 DIAGNOSIS — I5032 Chronic diastolic (congestive) heart failure: Secondary | ICD-10-CM | POA: Diagnosis not present

## 2014-12-12 DIAGNOSIS — M810 Age-related osteoporosis without current pathological fracture: Secondary | ICD-10-CM | POA: Diagnosis not present

## 2014-12-13 DIAGNOSIS — F0390 Unspecified dementia without behavioral disturbance: Secondary | ICD-10-CM | POA: Diagnosis not present

## 2014-12-13 DIAGNOSIS — M6281 Muscle weakness (generalized): Secondary | ICD-10-CM | POA: Diagnosis not present

## 2014-12-13 DIAGNOSIS — I129 Hypertensive chronic kidney disease with stage 1 through stage 4 chronic kidney disease, or unspecified chronic kidney disease: Secondary | ICD-10-CM | POA: Diagnosis not present

## 2014-12-13 DIAGNOSIS — I872 Venous insufficiency (chronic) (peripheral): Secondary | ICD-10-CM | POA: Diagnosis not present

## 2014-12-13 DIAGNOSIS — I5032 Chronic diastolic (congestive) heart failure: Secondary | ICD-10-CM | POA: Diagnosis not present

## 2014-12-13 DIAGNOSIS — N189 Chronic kidney disease, unspecified: Secondary | ICD-10-CM | POA: Diagnosis not present

## 2014-12-15 DIAGNOSIS — M6281 Muscle weakness (generalized): Secondary | ICD-10-CM | POA: Diagnosis not present

## 2014-12-15 DIAGNOSIS — F0390 Unspecified dementia without behavioral disturbance: Secondary | ICD-10-CM | POA: Diagnosis not present

## 2014-12-15 DIAGNOSIS — I872 Venous insufficiency (chronic) (peripheral): Secondary | ICD-10-CM | POA: Diagnosis not present

## 2014-12-15 DIAGNOSIS — N189 Chronic kidney disease, unspecified: Secondary | ICD-10-CM | POA: Diagnosis not present

## 2014-12-15 DIAGNOSIS — I5032 Chronic diastolic (congestive) heart failure: Secondary | ICD-10-CM | POA: Diagnosis not present

## 2014-12-15 DIAGNOSIS — I129 Hypertensive chronic kidney disease with stage 1 through stage 4 chronic kidney disease, or unspecified chronic kidney disease: Secondary | ICD-10-CM | POA: Diagnosis not present

## 2015-01-09 DIAGNOSIS — F0390 Unspecified dementia without behavioral disturbance: Secondary | ICD-10-CM | POA: Diagnosis not present

## 2015-01-09 DIAGNOSIS — M6281 Muscle weakness (generalized): Secondary | ICD-10-CM | POA: Diagnosis not present

## 2015-01-09 DIAGNOSIS — I872 Venous insufficiency (chronic) (peripheral): Secondary | ICD-10-CM | POA: Diagnosis not present

## 2015-03-22 ENCOUNTER — Inpatient Hospital Stay (HOSPITAL_COMMUNITY)
Admission: EM | Admit: 2015-03-22 | Discharge: 2015-03-26 | DRG: 392 | Disposition: A | Discharge status: Skilled Nursing Facility | Payer: Medicare Other | Attending: Internal Medicine | Admitting: Internal Medicine

## 2015-03-22 ENCOUNTER — Emergency Department (HOSPITAL_COMMUNITY): Payer: Medicare Other

## 2015-03-22 ENCOUNTER — Encounter (HOSPITAL_COMMUNITY): Payer: Self-pay | Admitting: *Deleted

## 2015-03-22 DIAGNOSIS — E876 Hypokalemia: Secondary | ICD-10-CM | POA: Diagnosis not present

## 2015-03-22 DIAGNOSIS — D631 Anemia in chronic kidney disease: Secondary | ICD-10-CM | POA: Diagnosis not present

## 2015-03-22 DIAGNOSIS — N189 Chronic kidney disease, unspecified: Secondary | ICD-10-CM

## 2015-03-22 DIAGNOSIS — I509 Heart failure, unspecified: Secondary | ICD-10-CM | POA: Diagnosis not present

## 2015-03-22 DIAGNOSIS — K5790 Diverticulosis of intestine, part unspecified, without perforation or abscess without bleeding: Secondary | ICD-10-CM | POA: Diagnosis present

## 2015-03-22 DIAGNOSIS — K2980 Duodenitis without bleeding: Secondary | ICD-10-CM | POA: Diagnosis not present

## 2015-03-22 DIAGNOSIS — N183 Chronic kidney disease, stage 3 (moderate): Secondary | ICD-10-CM | POA: Diagnosis not present

## 2015-03-22 DIAGNOSIS — R262 Difficulty in walking, not elsewhere classified: Secondary | ICD-10-CM | POA: Diagnosis not present

## 2015-03-22 DIAGNOSIS — Z22322 Carrier or suspected carrier of Methicillin resistant Staphylococcus aureus: Secondary | ICD-10-CM

## 2015-03-22 DIAGNOSIS — F0390 Unspecified dementia without behavioral disturbance: Secondary | ICD-10-CM | POA: Diagnosis present

## 2015-03-22 DIAGNOSIS — L8990 Pressure ulcer of unspecified site, unspecified stage: Secondary | ICD-10-CM | POA: Diagnosis not present

## 2015-03-22 DIAGNOSIS — N182 Chronic kidney disease, stage 2 (mild): Secondary | ICD-10-CM

## 2015-03-22 DIAGNOSIS — R1310 Dysphagia, unspecified: Secondary | ICD-10-CM | POA: Diagnosis not present

## 2015-03-22 DIAGNOSIS — D509 Iron deficiency anemia, unspecified: Secondary | ICD-10-CM | POA: Diagnosis present

## 2015-03-22 DIAGNOSIS — K2990 Gastroduodenitis, unspecified, without bleeding: Secondary | ICD-10-CM | POA: Diagnosis not present

## 2015-03-22 DIAGNOSIS — I129 Hypertensive chronic kidney disease with stage 1 through stage 4 chronic kidney disease, or unspecified chronic kidney disease: Secondary | ICD-10-CM | POA: Diagnosis present

## 2015-03-22 DIAGNOSIS — R7989 Other specified abnormal findings of blood chemistry: Secondary | ICD-10-CM | POA: Diagnosis not present

## 2015-03-22 DIAGNOSIS — E860 Dehydration: Secondary | ICD-10-CM | POA: Diagnosis present

## 2015-03-22 DIAGNOSIS — K5900 Constipation, unspecified: Secondary | ICD-10-CM | POA: Diagnosis present

## 2015-03-22 DIAGNOSIS — R531 Weakness: Secondary | ICD-10-CM | POA: Diagnosis not present

## 2015-03-22 DIAGNOSIS — K219 Gastro-esophageal reflux disease without esophagitis: Secondary | ICD-10-CM | POA: Diagnosis not present

## 2015-03-22 DIAGNOSIS — I5032 Chronic diastolic (congestive) heart failure: Secondary | ICD-10-CM | POA: Diagnosis not present

## 2015-03-22 DIAGNOSIS — K5909 Other constipation: Secondary | ICD-10-CM | POA: Diagnosis not present

## 2015-03-22 DIAGNOSIS — K8020 Calculus of gallbladder without cholecystitis without obstruction: Secondary | ICD-10-CM | POA: Diagnosis not present

## 2015-03-22 DIAGNOSIS — R404 Transient alteration of awareness: Secondary | ICD-10-CM | POA: Diagnosis not present

## 2015-03-22 DIAGNOSIS — N179 Acute kidney failure, unspecified: Secondary | ICD-10-CM | POA: Diagnosis present

## 2015-03-22 DIAGNOSIS — M6281 Muscle weakness (generalized): Secondary | ICD-10-CM | POA: Diagnosis not present

## 2015-03-22 LAB — CBC WITH DIFFERENTIAL/PLATELET
BASOS PCT: 0 %
Basophils Absolute: 0 10*3/uL (ref 0.0–0.1)
Eosinophils Absolute: 0.1 10*3/uL (ref 0.0–0.7)
Eosinophils Relative: 1 %
HCT: 27.6 % — ABNORMAL LOW (ref 36.0–46.0)
Hemoglobin: 9.1 g/dL — ABNORMAL LOW (ref 12.0–15.0)
Lymphocytes Relative: 22 %
Lymphs Abs: 2.1 10*3/uL (ref 0.7–4.0)
MCH: 30.5 pg (ref 26.0–34.0)
MCHC: 33 g/dL (ref 30.0–36.0)
MCV: 92.6 fL (ref 78.0–100.0)
MONO ABS: 0.8 10*3/uL (ref 0.1–1.0)
Monocytes Relative: 9 %
Neutro Abs: 6.6 10*3/uL (ref 1.7–7.7)
Neutrophils Relative %: 68 %
Platelets: 219 10*3/uL (ref 150–400)
RBC: 2.98 MIL/uL — ABNORMAL LOW (ref 3.87–5.11)
RDW: 14.5 % (ref 11.5–15.5)
WBC: 9.6 10*3/uL (ref 4.0–10.5)

## 2015-03-22 LAB — CBC
HEMATOCRIT: 27.1 % — AB (ref 36.0–46.0)
HEMOGLOBIN: 8.8 g/dL — AB (ref 12.0–15.0)
MCH: 30 pg (ref 26.0–34.0)
MCHC: 32.5 g/dL (ref 30.0–36.0)
MCV: 92.5 fL (ref 78.0–100.0)
Platelets: 235 10*3/uL (ref 150–400)
RBC: 2.93 MIL/uL — ABNORMAL LOW (ref 3.87–5.11)
RDW: 14.5 % (ref 11.5–15.5)
WBC: 9.3 10*3/uL (ref 4.0–10.5)

## 2015-03-22 LAB — COMPREHENSIVE METABOLIC PANEL
ALBUMIN: 2.9 g/dL — AB (ref 3.5–5.0)
ALT: 11 U/L — ABNORMAL LOW (ref 14–54)
ANION GAP: 6 (ref 5–15)
AST: 15 U/L (ref 15–41)
Alkaline Phosphatase: 37 U/L — ABNORMAL LOW (ref 38–126)
BILIRUBIN TOTAL: 0.6 mg/dL (ref 0.3–1.2)
BUN: 31 mg/dL — ABNORMAL HIGH (ref 6–20)
CO2: 27 mmol/L (ref 22–32)
Calcium: 9.1 mg/dL (ref 8.9–10.3)
Chloride: 110 mmol/L (ref 101–111)
Creatinine, Ser: 0.8 mg/dL (ref 0.44–1.00)
GFR calc Af Amer: >60 mL/min (ref >60)
GFR calc non Af Amer: >60 mL/min (ref >60)
Glucose, Bld: 102 mg/dL — ABNORMAL HIGH (ref 65–99)
POTASSIUM: 3.3 mmol/L — AB (ref 3.5–5.1)
Sodium: 143 mmol/L (ref 135–145)
TOTAL PROTEIN: 7.7 g/dL (ref 6.5–8.1)

## 2015-03-22 LAB — URINALYSIS, ROUTINE W REFLEX MICROSCOPIC
BILIRUBIN URINE: NEGATIVE
Glucose, UA: NEGATIVE mg/dL
Hgb urine dipstick: NEGATIVE
KETONES UR: NEGATIVE mg/dL
Leukocytes, UA: NEGATIVE
Nitrite: NEGATIVE
Protein, ur: NEGATIVE mg/dL
Specific Gravity, Urine: 1.023 (ref 1.005–1.030)
pH: 6.5 (ref 5.0–8.0)

## 2015-03-22 LAB — RETICULOCYTES
RBC.: 2.9 MIL/uL — AB (ref 3.87–5.11)
Retic Count, Absolute: 55.1 10*3/uL (ref 19.0–186.0)
Retic Ct Pct: 1.9 % (ref 0.4–3.1)

## 2015-03-22 LAB — GLUCOSE, CAPILLARY: Glucose-Capillary: 113 mg/dL — ABNORMAL HIGH (ref 65–99)

## 2015-03-22 LAB — MRSA PCR SCREENING: MRSA BY PCR: POSITIVE — AB

## 2015-03-22 LAB — LIPASE, BLOOD: LIPASE: 16 U/L (ref 11–51)

## 2015-03-22 LAB — IRON AND TIBC
Iron: 41 ug/dL (ref 28–170)
SATURATION RATIOS: 16 % (ref 10.4–31.8)
TIBC: 255 ug/dL (ref 250–450)
UIBC: 214 ug/dL

## 2015-03-22 LAB — FOLATE: FOLATE: 18.4 ng/mL (ref >5.9)

## 2015-03-22 LAB — FERRITIN: Ferritin: 31 ng/mL (ref 11–307)

## 2015-03-22 LAB — VITAMIN B12: VITAMIN B 12: 256 pg/mL (ref 180–914)

## 2015-03-22 LAB — TSH: TSH: 3.146 u[IU]/mL (ref 0.350–4.500)

## 2015-03-22 MED ORDER — DOCUSATE SODIUM 100 MG PO CAPS
100.0000 mg | ORAL_CAPSULE | Freq: Two times a day (BID) | ORAL | Status: DC
  Administered 2015-03-23 – 2015-03-26 (×7): 100 mg via ORAL
  Filled 2015-03-22 (×8): qty 1

## 2015-03-22 MED ORDER — PREGABALIN 25 MG PO CAPS: 25.0000 mg | ORAL_CAPSULE | Freq: Every day | ORAL | Status: DC

## 2015-03-22 MED ORDER — POLYETHYLENE GLYCOL 3350 17 G PO PACK
17.0000 g | PACK | Freq: Two times a day (BID) | ORAL | Status: DC
  Administered 2015-03-23 – 2015-03-26 (×7): 17 g via ORAL
  Filled 2015-03-22 (×8): qty 1

## 2015-03-22 MED ORDER — FERROUS SULFATE 325 (65 FE) MG PO TABS
325.0000 mg | ORAL_TABLET | Freq: Every day | ORAL | Status: DC
  Administered 2015-03-23 – 2015-03-26 (×4): 325 mg via ORAL
  Filled 2015-03-22 (×4): qty 1

## 2015-03-22 MED ORDER — MUPIROCIN 2 % EX OINT
1.0000 "application " | TOPICAL_OINTMENT | Freq: Two times a day (BID) | CUTANEOUS | Status: DC
  Administered 2015-03-22 – 2015-03-26 (×8): 1 via NASAL
  Filled 2015-03-22 (×3): qty 22

## 2015-03-22 MED ORDER — ONDANSETRON HCL 4 MG/2ML IJ SOLN
4.0000 mg | Freq: Once | INTRAMUSCULAR | Status: AC
  Administered 2015-03-22: 4 mg via INTRAVENOUS
  Filled 2015-03-22: qty 2

## 2015-03-22 MED ORDER — IOHEXOL 300 MG/ML  SOLN
80.0000 mL | Freq: Once | INTRAMUSCULAR | Status: AC | PRN
  Administered 2015-03-22: 80 mL via INTRAVENOUS

## 2015-03-22 MED ORDER — CETYLPYRIDINIUM CHLORIDE 0.05 % MT LIQD
7.0000 mL | Freq: Two times a day (BID) | OROMUCOSAL | Status: DC
  Administered 2015-03-22 – 2015-03-26 (×8): 7 mL via OROMUCOSAL

## 2015-03-22 MED ORDER — SODIUM CHLORIDE 0.9 % IV BOLUS (SEPSIS)
500.0000 mL | Freq: Once | INTRAVENOUS | Status: AC
Start: 2015-03-22 — End: 2015-03-22
  Administered 2015-03-22: 500 mL via INTRAVENOUS

## 2015-03-22 MED ORDER — ONDANSETRON HCL 4 MG PO TABS: 4.0000 mg | ORAL_TABLET | Freq: Four times a day (QID) | ORAL | Status: DC | PRN

## 2015-03-22 MED ORDER — SODIUM CHLORIDE 0.9 % IV SOLN
INTRAVENOUS | Status: DC
  Administered 2015-03-22 – 2015-03-23 (×3): via INTRAVENOUS

## 2015-03-22 MED ORDER — PANTOPRAZOLE SODIUM 40 MG IV SOLR
40.0000 mg | Freq: Two times a day (BID) | INTRAVENOUS | Status: DC
  Administered 2015-03-22 – 2015-03-23 (×3): 40 mg via INTRAVENOUS
  Filled 2015-03-22 (×4): qty 40

## 2015-03-22 MED ORDER — CHLORHEXIDINE GLUCONATE CLOTH 2 % EX PADS
6.0000 | MEDICATED_PAD | Freq: Every day | CUTANEOUS | Status: DC
  Administered 2015-03-23 – 2015-03-25 (×3): 6 via TOPICAL

## 2015-03-22 MED ORDER — ACETAMINOPHEN 325 MG PO TABS
650.0000 mg | ORAL_TABLET | Freq: Four times a day (QID) | ORAL | Status: DC | PRN
Start: 2015-03-22 — End: 2015-03-26

## 2015-03-22 MED ORDER — ALUM & MAG HYDROXIDE-SIMETH 200-200-20 MG/5ML PO SUSP
15.0000 mL | Freq: Once | ORAL | Status: AC
  Administered 2015-03-22: 15 mL via ORAL
  Filled 2015-03-22: qty 30

## 2015-03-22 MED ORDER — POTASSIUM CHLORIDE CRYS ER 20 MEQ PO TBCR
40.0000 meq | EXTENDED_RELEASE_TABLET | Freq: Once | ORAL | Status: AC
  Administered 2015-03-22: 40 meq via ORAL
  Filled 2015-03-22: qty 2

## 2015-03-22 MED ORDER — LIDOCAINE VISCOUS 2 % MT SOLN
15.0000 mL | Freq: Once | OROMUCOSAL | Status: AC
  Administered 2015-03-22: 15 mL via OROMUCOSAL
  Filled 2015-03-22: qty 15

## 2015-03-22 MED ORDER — ONDANSETRON HCL 4 MG/2ML IJ SOLN: 4.0000 mg | Freq: Four times a day (QID) | INTRAMUSCULAR | Status: DC | PRN

## 2015-03-22 MED ORDER — AMLODIPINE BESYLATE 5 MG PO TABS
5.0000 mg | ORAL_TABLET | Freq: Every day | ORAL | Status: DC
Start: 2015-03-22 — End: 2015-03-26
  Administered 2015-03-22 – 2015-03-26 (×5): 5 mg via ORAL
  Filled 2015-03-22 (×5): qty 1

## 2015-03-22 MED ORDER — MAGNESIUM HYDROXIDE 400 MG/5ML PO SUSP
30.0000 mL | Freq: Once | ORAL | Status: AC
  Administered 2015-03-22: 30 mL via ORAL
  Filled 2015-03-22: qty 30

## 2015-03-22 MED ORDER — VITAMIN D 1000 UNITS PO TABS
1000.0000 [IU] | ORAL_TABLET | Freq: Every day | ORAL | Status: DC
  Administered 2015-03-22 – 2015-03-26 (×5): 1000 [IU] via ORAL
  Filled 2015-03-22 (×5): qty 1

## 2015-03-22 MED ORDER — ACETAMINOPHEN 650 MG RE SUPP: 650.0000 mg | Freq: Four times a day (QID) | RECTAL | Status: DC | PRN

## 2015-03-22 NOTE — Progress Notes (Signed)
Patient's MRSA PCR came back positive. Patient has been placed on contact isolation. Protocol order sets for positive MRSA PCR started.

## 2015-03-22 NOTE — ED Provider Notes (Signed)
CSN: [id number]     Arrival date & time 03/22/15  [id number] History   First MD Initiated Contact with Patient 03/22/15 1018     Chief Complaint  Patient presents with  . Altered Mental Status     (Consider location/radiation/quality/duration/timing/severity/associated sxs/prior Treatment) Patient is a 81 y.o. female presenting with altered mental status.  Altered Mental Status Associated symptoms: no abdominal pain, no fever, no nausea, no palpitations and no vomiting     81 yo woman with dementia, HTN, stable dCHF, history of diverticulosis and history of lower GI bleed who is accompanied by her son who provided most of the history. Son says that she has been feeling weak, and she has not been eating well. She denies fevers, chills, ,n/v/d/c.  Then this morning the son said that mother saw "specks of blood" in her stool. So he was concerned and thought he should bring her here to check out what may be causing her weakness. Son and patient both deny any focal weakness, or any acute change from the baseline. Her mental status has been the same as she has stable chronic Alzheimer's disease. Patient lives with her son and he denies any falls. Patient also says she was having some diffuse abdominal pain yesterday, but denies any current abdominal pain. She denies any dysuria or hematuria.  She is currently on lasix and amlodipine for her hypertension. She also takes iron supplements and vitamin c.   Past Medical History  Diagnosis Date  . CHF (congestive heart failure) ([place])   . Hypertension   . Aneurysm ([place]) 1972    cerebral  . Acid reflux     occasional  . Tremor     worse on right arm  . History of GI diverticular bleed 06/2009    colonoscopy with endo clipping of bleeding tic by Dr Clarene Essex.  Flex sig by Dr Paulita Fujita.  . Acute blood loss anemia 06/2009    received ~ 6 units blood  . Adenomatous polyp of colon 06/2009  . Diverticulitis   . UTI (lower urinary tract infection)   . GI  bleed 02/14/2014   Past Surgical History  Procedure Laterality Date  . Hip fracture surgery  2011  . Colonoscopy w/ control of hemorrhage  06/2009  . Knee arthroscopy    . Cerebral aneurysm repair  1970's  . Colonoscopy  09/22/2011    Procedure: COLONOSCOPY;  Surgeon: Juanita Craver, MD;  Location: [facility] ENDOSCOPY;  Service: Endoscopy;  Laterality: N/A;  wants ped scope   History reviewed. No pertinent family history. Social History  Substance Use Topics  . Smoking status: Never Smoker   . Smokeless tobacco: Never Used  . Alcohol Use: No   OB History    No data available     Review of Systems  Constitutional: Positive for appetite change. Negative for fever, chills, activity change, fatigue and unexpected weight change.  HENT: Negative for congestion and sore throat.   Respiratory: Negative for cough, choking, chest tightness, shortness of breath and wheezing.   Cardiovascular: Negative for chest pain, palpitations and leg swelling.  Gastrointestinal: Positive for blood in stool. Negative for nausea, vomiting, abdominal pain, diarrhea and abdominal distention.  Genitourinary: Negative for dysuria, hematuria, vaginal discharge and pelvic pain.  Musculoskeletal: Positive for arthralgias.      Allergies  Review of patient's allergies indicates no known allergies.  Home Medications   Prior to Admission medications   Medication Sig Start Date End Date Taking? Authorizing Provider  amLODipine (NORVASC)  5 MG tablet Take 5 mg by mouth daily.    Historical Provider, MD  Ascorbic Acid (VITAMIN C) 100 MG tablet Take 100 mg by mouth daily. With rose hips 500 mg    Historical Provider, MD  cholecalciferol (VITAMIN D) 1000 UNITS tablet Take 1,000 Units by mouth daily.    Historical Provider, MD  docusate sodium (COLACE) 100 MG capsule Take 100 mg by mouth at bedtime.    Historical Provider, MD  ferrous sulfate 325 (65 FE) MG tablet Take 325 mg by mouth daily with breakfast.    Historical  Provider, MD  furosemide (LASIX) 20 MG tablet Take 10 mg by mouth daily.     Historical Provider, MD  guaiFENesin (MUCINEX) 600 MG 12 hr tablet Take 1 tablet (600 mg total) by mouth 2 (two) times daily. 10/11/14   Verlee Monte, MD  guaifenesin (ROBITUSSIN) 100 MG/5ML syrup Take 200 mg by mouth 3 (three) times daily as needed for cough.    Historical Provider, MD  Ibuprofen-Diphenhydramine HCl (ADVIL PM) 200-25 MG CAPS Take 1 capsule by mouth at bedtime as needed (Sleep/pain).    Historical Provider, MD  Multiple Minerals (CALCIUM-MAGNESIUM-ZINC) TABS Take 1 tablet by mouth daily.    Historical Provider, MD  polyethylene glycol (MIRALAX / GLYCOLAX) packet Take 17 g by mouth daily as needed for mild constipation. 10/11/14   Verlee Monte, MD  Pregabalin (LYRICA PO) Take by mouth.    Historical Provider, MD  temazepam (RESTORIL) 7.5 MG capsule Take 1 capsule (7.5 mg total) by mouth at bedtime as needed for sleep. 10/11/14   Verlee Monte, MD   BP 155/89 mmHg  Pulse 75  Temp(Src) 97.6 F (36.4 C) (Oral)  Resp 19  SpO2 99% Physical Exam  Constitutional: She appears well-developed.  HENT:  Head: Normocephalic and atraumatic.  Neck: Normal range of motion. Neck supple.  Cardiovascular: Normal rate, regular rhythm, normal heart sounds and intact distal pulses.   Pulmonary/Chest: Effort normal and breath sounds normal. No respiratory distress. She has no wheezes. She has no rales.  Abdominal: Soft. Bowel sounds are normal. She exhibits no distension and no mass. There is no tenderness. There is no rebound.  Neurological: She is alert.  Alert and oriented to person and place but not time- says it is 70 but son says this is normal for her as she has alzheimers     ED Course  Procedures (including critical care time) Labs Review Labs Reviewed  URINALYSIS, [place] (NOT AT [facility])  CBC WITH DIFFERENTIAL/PLATELET  COMPREHENSIVE METABOLIC PANEL    Imaging Review No results  found. I have personally reviewed and evaluated these images and lab results as part of my medical decision-making.   EKG Interpretation None      MDM   81 yo with few days of generalized weakness and some abdominal pain. Differentials include UTI, electrolyte abnormalities (hyponatremia, hypo/hyperkalemia etc), recurrence of diverticular bleed, or just getting old.   Unlikely to be UTI as pt denies any dysuria and her UA is clear Unlikely to be a recurrence of diverticular bleed. Although the patient's son said he saw "specks of blood", patient denies any brisk bleeding, and her hemoglobin is stable from the last time she was seen here.  Unlikely to be pancreatitis as lipase normal.  CT abdomen shows some possible duodenitis and they recommended repeating CT again.  Patient continues to have severe epigastric abdominal pain and constipation. Pt could only tolerate half a sandwich. [facility] called  and they will admit this patient.    IMPRESSION: 1. Questionable thickening of the walls in the duodenal bulb region which could be neoplastic, infectious or inflammatory in nature. Characterization of this area, however, is limited by patient motion artifact. Could consider repeat CT of the abdomen or endoscopic evaluation. 2. Extensive colonic diverticulosis without evidence of acute diverticulitis. 3. Cholelithiasis without evidence of acute cholecystitis. 4. Additional chronic/incidental findings detailed above.     Final diagnoses:  None    duodenitis    Burgess Estelle, MD 03/22/15 [place], DO 03/22/15 [id number]

## 2015-03-22 NOTE — H&P (Signed)
[facility] History and Physical                                                                                    Sarah Perkins, is a 81 y.o. female  MRN: [id number]   DOB - 1917-11-13  Admit Date - 03/22/2015  Outpatient Primary MD for the patient is Thressa Sheller, MD  Referring MD: [facility] Nine / ER  With History of -  Past Medical History  Diagnosis Date  . CHF (congestive heart failure) ([place])   . Hypertension   . Aneurysm ([place]) 1972    cerebral  . Acid reflux     occasional  . Tremor     worse on right arm  . History of GI diverticular bleed 06/2009    colonoscopy with endo clipping of bleeding tic by Dr Clarene Essex.  Flex sig by Dr Paulita Fujita.  . Acute blood loss anemia 06/2009    received ~ 6 units blood  . Adenomatous polyp of colon 06/2009  . Diverticulitis   . UTI (lower urinary tract infection)   . GI bleed 02/14/2014      Past Surgical History  Procedure Laterality Date  . Hip fracture surgery  2011  . Colonoscopy w/ control of hemorrhage  06/2009  . Knee arthroscopy    . Cerebral aneurysm repair  1970's  . Colonoscopy  09/22/2011    Procedure: COLONOSCOPY;  Surgeon: Juanita Craver, MD;  Location: [facility] ENDOSCOPY;  Service: Endoscopy;  Laterality: N/A;  wants ped scope    in for   Chief Complaint  Patient presents with  . Altered Mental Status     HPI This is a 81 year old female patient with known progressive dementia, limited mobility and primarily sits in chair all day, reported history of diastolic heart failure, as well as hypertension, known diverticulosis, chronic kidney disease stage III, GERD, chronic recurrent constipation who is brought to the ER by her son secondary to progressive weakness and inability to eat/poor appetite. Son reports that over the past several days his mother has stopped eating as much as she typically does and today he noted she had significant difficulty with standing and pivoting with assistance. He also thought he saw spots  of blood in her stool and given her prior history of brisk GI bleeding on previous admissions he thought it best to bring the patient to the hospital for evaluation. She was also complaining of epigastric abdominal pain.  In the ER she was afebrile and hemodynamically stable and not hypoxemic. Abdominal pain was reproducible on exam by the EDP. CT of the abdomen and pelvis did not show any evidence of diverticulitis but had findings concerning for possible duodenitis. Also findings of significant constipation/stool burden. Laboratory data unremarkable except for mild hypokalemia potassium 3.3, azotemia with BUN 31 with baseline BUN around 14. Albumin low at 2.9, glucose 102, hemoglobin 9.1 which is stable for patient. Urinalysis was unremarkable but did have somewhat elevated specific gravity of 1.023. EKG was unremarkable.   Review of Systems   In addition to the HPI above,  No Fever-chills, myalgias or other constitutional symptoms No Headache, changes with Vision or hearing, tingling, numbness in any  extremity, No problems swallowing food or Liquids, indigestion/reflux; poor dentition No Chest pain, Cough or Shortness of Breath, palpitations, orthopnea or DOE No N/V; no melena or hematochezia, no dark tarry stools, son thought he may have seen tiny specks of bright red blood in movement today, No dysuria, hematuria or flank pain No new skin rashes, lesions, masses or bruises, No new joints pains-aches No recent weight gain or loss No polyuria, polydypsia or polyphagia,  *A full 10 point Review of Systems was done, except as stated above, all other Review of Systems were negative.  Social History Social History  Substance Use Topics  . Smoking status: Never Smoker   . Smokeless tobacco: Never Used  . Alcohol Use: No    Resides at: Private residence  Lives with: With son  Ambulatory status: Full assist unable to ambulate independently and according to son essentially only stands to  pivot and minimally ambulates with walker   Family History History reviewed. No pertinent family history.   Prior to Admission medications   Medication Sig Start Date End Date Taking? Authorizing Provider  amLODipine (NORVASC) 5 MG tablet Take 5 mg by mouth daily.   Yes Historical Provider, MD  Ascorbic Acid (VITAMIN C) 100 MG tablet Take 500 mg by mouth daily. With rose hips 500 mg   Yes Historical Provider, MD  Calcium-Magnesium-Vitamin D 185-50-100 MG-MG-UNIT CAPS Take 1 capsule by mouth daily.   Yes Historical Provider, MD  ferrous sulfate 325 (65 FE) MG tablet Take 325 mg by mouth daily with breakfast.   Yes Historical Provider, MD  furosemide (LASIX) 20 MG tablet Take 10 mg by mouth daily.    Yes Historical Provider, MD  Multiple Minerals (CALCIUM-MAGNESIUM-ZINC) TABS Take 1 tablet by mouth daily.   Yes Historical Provider, MD  cholecalciferol (VITAMIN D) 1000 UNITS tablet Take 1,000 Units by mouth daily.    Historical Provider, MD  docusate sodium (COLACE) 100 MG capsule Take 100 mg by mouth at bedtime.    Historical Provider, MD  guaiFENesin (MUCINEX) 600 MG 12 hr tablet Take 1 tablet (600 mg total) by mouth 2 (two) times daily. Patient not taking: Reported on 03/22/2015 10/11/14   Verlee Monte, MD  guaifenesin (ROBITUSSIN) 100 MG/5ML syrup Take 200 mg by mouth 3 (three) times daily as needed for cough.    Historical Provider, MD  Ibuprofen-Diphenhydramine HCl (ADVIL PM) 200-25 MG CAPS Take 1 capsule by mouth at bedtime as needed (Sleep/pain).    Historical Provider, MD  polyethylene glycol (MIRALAX / GLYCOLAX) packet Take 17 g by mouth daily as needed for mild constipation. Patient not taking: Reported on 03/22/2015 10/11/14   Verlee Monte, MD  Pregabalin (LYRICA PO) Take by mouth.    Historical Provider, MD  temazepam (RESTORIL) 7.5 MG capsule Take 1 capsule (7.5 mg total) by mouth at bedtime as needed for sleep. Patient not taking: Reported on 03/22/2015 10/11/14   Verlee Monte, MD      No Known Allergies  Physical Exam  Vitals  Blood pressure 155/89, pulse 75, temperature 97.6 F (36.4 C), temperature source Oral, resp. rate 19, SpO2 99 %.   General:  In no acute distress, appears stated age  Psych: Flat affect, minimally communicative, Denies Suicidal or Homicidal ideations, Awake Alert, Oriented X name only.   Neuro:   No focal neurological deficits, CN II through XII grossly intact, Strength 5/5 all 4 extremities, Sensation intact all 4 extremities.  ENT:  Ears and Eyes appear Normal, Conjunctivae clear, PER. Dry oral  mucosa without erythema or exudates.  Neck:  Supple, No lymphadenopathy appreciated  Respiratory:  Symmetrical chest wall movement, Good air movement bilaterally, CTAB. Room Air  Cardiac:  RRR, No Murmurs, no LE edema noted, no JVD, No carotid bruits, peripheral pulses palpable at 2+  Abdomen:  Positive bowel sounds, Soft, minimally tender right upper quadrant/epigastric region tender, Non distended,  No masses appreciated, no obvious hepatosplenomegaly  Skin:  No Cyanosis, poor Skin Turgor, No Skin Rash or Bruise.  Extremities: Symmetrical without obvious trauma or injury,  no effusions.  Data Review  CBC  Recent Labs Lab 03/22/15 1021  WBC 9.6  HGB 9.1*  HCT 27.6*  PLT 219  MCV 92.6  MCH 30.5  MCHC 33.0  RDW 14.5  LYMPHSABS 2.1  MONOABS 0.8  EOSABS 0.1  BASOSABS 0.0    Chemistries   Recent Labs Lab 03/22/15 1021  NA 143  K 3.3*  CL 110  CO2 27  GLUCOSE 102*  BUN 31*  CREATININE 0.80  CALCIUM 9.1  AST 15  ALT 11*  ALKPHOS 37*  BILITOT 0.6    CrCl cannot be calculated (Unknown ideal weight.).  No results for input(s): TSH, T4TOTAL, T3FREE, THYROIDAB in the last 72 hours.  Invalid input(s): FREET3  Coagulation profile No results for input(s): INR, PROTIME in the last 168 hours.  No results for input(s): DDIMER in the last 72 hours.  Cardiac Enzymes No results for input(s): CKMB, TROPONINI,  MYOGLOBIN in the last 168 hours.  Invalid input(s): CK  Invalid input(s): POCBNP  Urinalysis    Component Value Date/Time   COLORURINE YELLOW 03/22/2015 [place] 03/22/2015 1012   LABSPEC 1.023 03/22/2015 1012   PHURINE 6.5 03/22/2015 1012   GLUCOSEU NEGATIVE 03/22/2015 1012   HGBUR NEGATIVE 03/22/2015 1012   BILIRUBINUR NEGATIVE 03/22/2015 1012   KETONESUR NEGATIVE 03/22/2015 1012   PROTEINUR NEGATIVE 03/22/2015 1012   UROBILINOGEN 0.2 10/08/2014 1225   NITRITE NEGATIVE 03/22/2015 1012   LEUKOCYTESUR NEGATIVE 03/22/2015 1012    Imaging results:   Ct Abdomen Pelvis W Contrast  03/22/2015  CLINICAL DATA:  Generalized weakness, stomach pain for 2 days, decreased appetite. EXAM: CT ABDOMEN AND PELVIS WITH CONTRAST TECHNIQUE: Multidetector CT imaging of the abdomen and pelvis was performed using the standard protocol following bolus administration of intravenous contrast. CONTRAST:  45mL OMNIPAQUE IOHEXOL 300 MG/ML  SOLN COMPARISON:  CT dated 02/14/2014. FINDINGS: Lower chest: No acute findings. Small emphysematous blebs noted at the bilateral lung bases. Mild chronic scarring/atelectasis also noted at each lung base. Moderate-sized hiatal hernia again noted without significant change. Hepatobiliary: Layering stones again seen within the nondistended gallbladder. No evidence of acute cholecystitis. No bile duct dilatation. Liver appears normal. Pancreas: Infiltrated with fat throughout, similar to the previous exam. Otherwise unremarkable. Spleen: Within normal limits in size and appearance. Adrenals/Urinary Tract: Small renal cysts bilaterally, unchanged. No acute findings. No renal stone or hydronephrosis. No ureteral or bladder calculi identified. Bladder is decompressed and partially obscured by metallic artifact from patient's right hip prosthesis. Adrenal glands are unremarkable. Stomach/Bowel: There is questionable thickening of the walls of the duodenal bulb,  characterization limited by patient movement artifact. The stomach is nondistended. Remainder of the small bowel is normal in caliber and configuration. Fairly extensive diverticulosis noted throughout the colon but no focal inflammatory change to suggest acute diverticulitis. Appendix is normal. Vascular/Lymphatic: Atherosclerotic changes of the normal - caliber abdominal aorta and branch vessels. Prominent atherosclerotic calcifications seen at the left renal artery  ostium. No acute vascular abnormality seen. Reproductive: No mass or other significant abnormality. Presumed hysterectomy. Other: No free fluid or abscess collection identified. No free intraperitoneal air. Musculoskeletal: Degenerative changes are seen throughout the thoracolumbar spine and patient is status post right hip arthroplasty. Old compression fracture deformity of the L1 vertebral body is unchanged. No acute osseous abnormality seen. Periumbilical abdominal hernia again noted which contains fat only. IMPRESSION: 1. Questionable thickening of the walls in the duodenal bulb region which could be neoplastic, infectious or inflammatory in nature. Characterization of this area, however, is limited by patient motion artifact. Could consider repeat CT of the abdomen or endoscopic evaluation. 2. Extensive colonic diverticulosis without evidence of acute diverticulitis. 3. Cholelithiasis without evidence of acute cholecystitis. 4. Additional chronic/incidental findings detailed above. Electronically Signed   By: Franki Cabot M.D.   On: 03/22/2015 12:38      Assessment & Plan  Principal Problem:   Duodenitis without bleeding -Medical floor -Serum H pylori -Empiric IV Protonix twice a day -Tylenol only for abdominal pain  Active Problems:   Constipation -Suspect related to physical inactivity as well as chronic volume depletion from daily Lasix and associated oral intake -MOM x 1 now -Increase MiraLAX from daily prn to twice a day  scheduled -Twice a day Colace -Discontinue diuretics permanently    Azotemia -Etiology either upper GI bleed (doubt since no black tarry stools) or from volume depletion from diuretics -Stop diuretics -Gentle IV fluid hydration -Follow-up lab    Acute hypokalemia -PO KCL 40 meq 1 dose -Lab in a.m.    HTN (hypertension) -Controlled -Continue Norvasc    CKD (chronic kidney disease), stage I -Creatinine stable and at baseline    Chronic diastolic CHF (congestive heart failure) ([place]) -Echo in 2013 with preserved LV function and only mild LVH with no mention of diastolic dysfunction -Patient does have mild to moderate pulmonary hypertension based on that echo (44 mmHg) -Repeat echo this admission -Given patient's advanced dementia and variable intake she likely not a candidate for chronic diuretics especially Lasix -Son reports patient without any respiratory symptoms and has end of day lower extremity edema associated with legs in dependent position/patient sits most of the time with legs dependent- edema improves when legs are elevated    Diverticulosis -CT this admission without active diverticulitis    Dementia -Has progressed in the past 12 months -Given poor oral intake prior to admission last speech to evaluate swallowing -PT/OT evaluation -Son confirms patient now DO NOT RESUSCITATE    Anemia -Hemoglobin stable and at baseline -Repeat in a.m. after hydration    DVT Prophylaxis: SCDs since possible "specks"of blood seen on stool at home today  Family Communication:   Son at bedside  Code Status:  DO NOT RESUSCITATE  Condition:  Stable  Discharge disposition: Anticipate discharge back to home environment and next 48- 72 hours once medically stable  Time spent in minutes : 60      ELLIS,ALLISON L. ANP on 03/22/2015 at 4:18 PM  Between 7am to 7pm - Pager - [phone]  After 7pm go to [email or web address] - password [email or web address]  And look for the night coverage  person covering me after hours  [facility]

## 2015-03-22 NOTE — ED Notes (Signed)
Called 5W to give report.  Receiving RN unable to take report at this time. 

## 2015-03-22 NOTE — ED Notes (Signed)
Patient given a happy meal with a cup of ice water; patient's son helping patient eat

## 2015-03-22 NOTE — ED Notes (Signed)
Pt arrives from home via [facility]. Pt son states she has been weak and hasn't been eating x2 days. Pt son states she had dark red stool yesterday and his mother has been complaining of stomach pain and "not acting herself". Pt has a hx of dementia.

## 2015-03-22 NOTE — Progress Notes (Addendum)
Patient has 3 loose stools today. Tylene Fantasia NP paged and notified to see if patient needs to be on Enteric precaution to rule out C-diff.  Pedro Earls 03/22/2015 11:36 PM K. Baltazar Najjar NP called back, aware, stated patient had laxative today, no needs for Enteric precaution at the moment.  Pedro Earls 03/22/2015 11:46 PM

## 2015-03-22 NOTE — ED Provider Notes (Signed)
I have personally seen and examined the patient.  I have discussed the plan of care with the resident.  I have reviewed the documentation on PMH/FH/Soc. History.  I have reviewed the documentation of the resident and agree.   EKG Interpretation  Date/Time:    Ventricular Rate:    PR Interval:    QRS Duration:   QT Interval:    QTC Calculation:   R Axis:     Text Interpretation:         81 yo F with a chief complaints of weakness and not eating for the past 2 days. Family also has noted some bright red flecks in her stool yesterday. Patient complaining of severe epigastric abdominal pain think that's the reason why she has not been eating. Patient has a history of gallstones in the distant past declines surgery due to her age and comorbidities. On exam patient with significant epigastric abdominal tenderness. Negative Murphy sign. We'll obtain a CT scan of the abdomen and pelvis with contrast. CBC CMP lipase UA. EKG.   CT scan with possible duodenitis though there was motion during the scan. Radiology recommends repeat. Attempted oral trial. Patient having continued nausea and pain. Will admit.  Deno Etienne, DO 03/22/15 1646

## 2015-03-23 ENCOUNTER — Inpatient Hospital Stay (HOSPITAL_COMMUNITY): Payer: Medicare Other

## 2015-03-23 DIAGNOSIS — K5909 Other constipation: Secondary | ICD-10-CM

## 2015-03-23 DIAGNOSIS — E876 Hypokalemia: Secondary | ICD-10-CM

## 2015-03-23 DIAGNOSIS — N182 Chronic kidney disease, stage 2 (mild): Secondary | ICD-10-CM

## 2015-03-23 DIAGNOSIS — K2980 Duodenitis without bleeding: Principal | ICD-10-CM

## 2015-03-23 DIAGNOSIS — I509 Heart failure, unspecified: Secondary | ICD-10-CM

## 2015-03-23 DIAGNOSIS — I5032 Chronic diastolic (congestive) heart failure: Secondary | ICD-10-CM

## 2015-03-23 DIAGNOSIS — L8990 Pressure ulcer of unspecified site, unspecified stage: Secondary | ICD-10-CM | POA: Insufficient documentation

## 2015-03-23 LAB — BASIC METABOLIC PANEL
ANION GAP: 5 (ref 5–15)
BUN: 21 mg/dL — ABNORMAL HIGH (ref 6–20)
CALCIUM: 8.4 mg/dL — AB (ref 8.9–10.3)
CO2: 25 mmol/L (ref 22–32)
Chloride: 114 mmol/L — ABNORMAL HIGH (ref 101–111)
Creatinine, Ser: 0.67 mg/dL (ref 0.44–1.00)
GLUCOSE: 89 mg/dL (ref 65–99)
POTASSIUM: 3.9 mmol/L (ref 3.5–5.1)
Sodium: 144 mmol/L (ref 135–145)

## 2015-03-23 LAB — CBC
HEMATOCRIT: 24.1 % — AB (ref 36.0–46.0)
Hemoglobin: 7.7 g/dL — ABNORMAL LOW (ref 12.0–15.0)
MCH: 30.1 pg (ref 26.0–34.0)
MCHC: 32 g/dL (ref 30.0–36.0)
MCV: 94.1 fL (ref 78.0–100.0)
PLATELETS: 203 10*3/uL (ref 150–400)
RBC: 2.56 MIL/uL — AB (ref 3.87–5.11)
RDW: 14.7 % (ref 11.5–15.5)
WBC: 8.4 10*3/uL (ref 4.0–10.5)

## 2015-03-23 MED ORDER — BISACODYL 10 MG RE SUPP
10.0000 mg | Freq: Once | RECTAL | Status: AC
  Administered 2015-03-23: 10 mg via RECTAL
  Filled 2015-03-23: qty 1

## 2015-03-23 MED ORDER — SODIUM CHLORIDE 0.9 % IV SOLN
125.0000 mg | Freq: Once | INTRAVENOUS | Status: AC
  Administered 2015-03-23: 125 mg via INTRAVENOUS
  Filled 2015-03-23 (×2): qty 10

## 2015-03-23 NOTE — Evaluation (Signed)
Clinical/Bedside Swallow Evaluation Patient Details  Name: ANNALEAH WAREING MRN: [id number] Date of Birth: December 05, 1917  Today's Date: 03/23/2015 Time: SLP Start Time (ACUTE ONLY): 0830 SLP Stop Time (ACUTE ONLY): 0848 SLP Time Calculation (min) (ACUTE ONLY): 18 min  Past Medical History:  Past Medical History  Diagnosis Date  . CHF (congestive heart failure) ([place])   . Hypertension   . Aneurysm ([place]) 1972    cerebral  . Acid reflux     occasional  . Tremor     worse on right arm  . History of GI diverticular bleed 06/2009    colonoscopy with endo clipping of bleeding tic by Dr Clarene Essex.  Flex sig by Dr Paulita Fujita.  . Acute blood loss anemia 06/2009    received ~ 6 units blood  . Adenomatous polyp of colon 06/2009  . Diverticulitis   . UTI (lower urinary tract infection)   . GI bleed 02/14/2014   Past Surgical History:  Past Surgical History  Procedure Laterality Date  . Hip fracture surgery  2011  . Colonoscopy w/ control of hemorrhage  06/2009  . Knee arthroscopy    . Cerebral aneurysm repair  1970's  . Colonoscopy  09/22/2011    Procedure: COLONOSCOPY;  Surgeon: Juanita Craver, MD;  Location: [facility] ENDOSCOPY;  Service: Endoscopy;  Laterality: N/A;  wants ped scope   HPI:  Pt is a 81 y.o. female with known progressive dementia, limited mobility, diastolic heart failure, as well as hypertension, known diverticulosis, chronic kidney disease stage III, GERD, chronic recurrent constipation who was brought to the ER 11/23 by her son secondary to progressive weakness and inability to eat/poor appetite which had been ongoing for several days, along with abdominal pain. Abdominal CT showed questionable thickening of the walls in the duodenal bulb region which could be neoplastic, infectious or inflammatory in nature. Bedside swallow eval ordered due to risk factors including dementia and lack of appetite.    Assessment / Plan / Recommendation Clinical Impression  Pt demonstrated no overt  s/s of aspiration at bedside with dysphagia 3 consistency breakfast/ liquids. Swallow appeared timely. Pt did have prolonged mastication with solids, likely due to missing dentition. No PMH of PNA noted; however, pt does have hx of reflux and dementia which increases risk of aspiration. With reflux precautions in place, risk will be reduced. Recommend continuing dysphagia 3 diet, thin liquids, meds whole with liquid, intermittent supervision- ensure pt is seated as upright 90 degrees during meals, encourage small bites/ sips, have pt sit upright 30-60 minutes after meal. SLP will sign off at this time- please re-consult if needs arise.    Aspiration Risk  Mild aspiration risk    Diet Recommendation Dysphagia 3 (Mech soft);Thin liquid   Liquid Administration via: Cup;Straw Medication Administration: Whole meds with liquid Supervision: Patient able to self feed;Intermittent supervision to cue for compensatory strategies Compensations: Slow rate;Small sips/bites;Follow solids with liquid Postural Changes: Seated upright at 90 degrees;Remain upright for at least 30 minutes after po intake    Other  Recommendations Oral Care Recommendations: Oral care BID   Follow up Recommendations  None    Frequency and Duration            Prognosis        Swallow Study   General HPI: Pt is a 81 y.o. female with known progressive dementia, limited mobility, diastolic heart failure, as well as hypertension, known diverticulosis, chronic kidney disease stage III, GERD, chronic recurrent constipation who was brought to the ER  11/23 by her son secondary to progressive weakness and inability to eat/poor appetite which had been ongoing for several days, along with abdominal pain. Abdominal CT showed questionable thickening of the walls in the duodenal bulb region which could be neoplastic, infectious or inflammatory in nature. Bedside swallow eval ordered due to risk factors including dementia and lack of appetite.   Type of Study: Bedside Swallow Evaluation Diet Prior to this Study: Dysphagia 3 (soft);Thin liquids Temperature Spikes Noted: No Respiratory Status: Room air History of Recent Intubation: No Behavior/Cognition: Alert;Cooperative;Pleasant mood Oral Cavity Assessment: Within Functional Limits Oral Care Completed by SLP: No Oral Cavity - Dentition: Missing dentition Vision: Functional for self-feeding Self-Feeding Abilities: Able to feed self Patient Positioning: Upright in bed Baseline Vocal Quality: Normal Volitional Cough: Strong    Oral/Motor/Sensory Function Overall Oral Motor/Sensory Function: Within functional limits   Ice Chips Ice chips: Not tested   Thin Liquid Thin Liquid: Within functional limits Presentation: Cup;Straw    Nectar Thick Nectar Thick Liquid: Not tested   Honey Thick Honey Thick Liquid: Not tested   Puree Puree: Not tested   Solid Solid: Impaired Presentation: Self Fed;Spoon Oral Phase Impairments: Impaired mastication;Other (comment) (slow mastication) Oral Phase Functional Implications: Impaired mastication       Oleksiak, Amy K, MA, CCC-SLP 03/23/2015,8:58 AM [phone]

## 2015-03-23 NOTE — Progress Notes (Signed)
PROGRESS NOTE  Sarah Perkins [id number] DOB: 1918/01/09 DOA: 03/22/2015 PCP: Thressa Sheller, MD  Brief history 81 year old female with history of diastolic CHF, hypertension, diverticular bleed, dementia, CKD stage III, constipation presented with 2-3 day history of progressive weakness and anorexia. At baseline, the patient has poor mobility and essentially sits in her chair most of the day. She is able to help in transfers, but commonly requires the assistance of her son who is her primary caretaker. However, the patient has had progressive weakness to the point where she has had difficulty standing. As a result, the patient was brought to emergency department for further evaluation. The patient's son also noted some possible hematochezia. Assessment/Plan: Duodenitis -03/22/2015 CT abdomen and pelvis shows basilar blebs with question of thickening of the duodenal bulb however the images were limited by the patient's movement. Otherwise, there was no acute intra-abdominal findings except for large amount of stool burden -Continue PPI -Avoid NSAIDs Constipation -This is contributing to the patient's abdominal pain -The patient will need to be discharged with daily bowel regimen -Continue MiraLAX -Add bisacodyl suppository Dehydration -The patient had pre-renal azotemia noted at the time of admission which fits her clinical exam and history -Continue intravenous fluids Generalized weakness/FTT -Urinalysis is negative for polyuria -TSH 3.146 -nutrition evaluation History of GI bleed -Baseline hemoglobin 8-9 -Hemoglobin 8.8 at the time of admission -Drop in hemoglobin likely represents dilution secondary to intravenous fluids -09/22/2011 colonoscopy showed pandiverticulosis -Given the patient's age and comorbidities, would opt for conservative therapy and close observation -Patient was taking Advil PM at bedtime at home -Avoid NSAIDs  Chronic diastolic  CHF -Compensated at this time -05/16/2011 echo shows EF 60-65% -Hold home dose of furosemide and monitor clinically Iron deficiency anemia  -Iron saturation 16%, ferritin 31  -Restart iron supplementation  -Give one dose Nulecit Dementia -appreciate speech evaluation-->Dys 3 diet with thin liquids -PT evaluation Hypertension  -Continue amlodipine  CKD stage 2-3 -Baseline creatinine 0.6-0.9   Family Communication:   Son updated at beside Disposition Plan:   Likely SNF in 1-2 days      Procedures/Studies: Ct Abdomen Pelvis W Contrast  03/22/2015  CLINICAL DATA:  Generalized weakness, stomach pain for 2 days, decreased appetite. EXAM: CT ABDOMEN AND PELVIS WITH CONTRAST TECHNIQUE: Multidetector CT imaging of the abdomen and pelvis was performed using the standard protocol following bolus administration of intravenous contrast. CONTRAST:  54mL OMNIPAQUE IOHEXOL 300 MG/ML  SOLN COMPARISON:  CT dated 02/14/2014. FINDINGS: Lower chest: No acute findings. Small emphysematous blebs noted at the bilateral lung bases. Mild chronic scarring/atelectasis also noted at each lung base. Moderate-sized hiatal hernia again noted without significant change. Hepatobiliary: Layering stones again seen within the nondistended gallbladder. No evidence of acute cholecystitis. No bile duct dilatation. Liver appears normal. Pancreas: Infiltrated with fat throughout, similar to the previous exam. Otherwise unremarkable. Spleen: Within normal limits in size and appearance. Adrenals/Urinary Tract: Small renal cysts bilaterally, unchanged. No acute findings. No renal stone or hydronephrosis. No ureteral or bladder calculi identified. Bladder is decompressed and partially obscured by metallic artifact from patient's right hip prosthesis. Adrenal glands are unremarkable. Stomach/Bowel: There is questionable thickening of the walls of the duodenal bulb, characterization limited by patient movement artifact. The stomach is  nondistended. Remainder of the small bowel is normal in caliber and configuration. Fairly extensive diverticulosis noted throughout the colon but no focal inflammatory change to suggest acute diverticulitis. Appendix is normal. Vascular/Lymphatic: Atherosclerotic changes of the  normal - caliber abdominal aorta and branch vessels. Prominent atherosclerotic calcifications seen at the left renal artery ostium. No acute vascular abnormality seen. Reproductive: No mass or other significant abnormality. Presumed hysterectomy. Other: No free fluid or abscess collection identified. No free intraperitoneal air. Musculoskeletal: Degenerative changes are seen throughout the thoracolumbar spine and patient is status post right hip arthroplasty. Old compression fracture deformity of the L1 vertebral body is unchanged. No acute osseous abnormality seen. Periumbilical abdominal hernia again noted which contains fat only. IMPRESSION: 1. Questionable thickening of the walls in the duodenal bulb region which could be neoplastic, infectious or inflammatory in nature. Characterization of this area, however, is limited by patient motion artifact. Could consider repeat CT of the abdomen or endoscopic evaluation. 2. Extensive colonic diverticulosis without evidence of acute diverticulitis. 3. Cholelithiasis without evidence of acute cholecystitis. 4. Additional chronic/incidental findings detailed above. Electronically Signed   By: Franki Cabot M.D.   On: 03/22/2015 12:38         Subjective: Patient denies fevers, chills, headache, chest pain, dyspnea, nausea, vomiting, diarrhea, abdominal pain, dysuria, hematuria   Objective: Filed Vitals:   03/22/15 1809 03/22/15 1810 03/22/15 2201 03/23/15 0638  BP: 150/63 149/59 147/63 135/49  Pulse: 94 98 92 86  Temp:  98.3 F (36.8 C) 97.8 F (36.6 C) 97.9 F (36.6 C)  TempSrc:  Oral Oral Oral  Resp: 19 16 18 18   Height:      Weight:      SpO2: 93% 93% 93% 94%     Intake/Output Summary (Last 24 hours) at 03/23/15 1033 Last data filed at 03/23/15 [id number]  Gross per 24 hour  Intake   1090 ml  Output      3 ml  Net   1087 ml   Weight change:  Exam:   General:  Pt is alert, follows commands appropriately, not in acute distress  HEENT: No icterus, No thrush, No neck mass, [place]/AT  Cardiovascular: RRR, S1/S2, no rubs, no gallops  Respiratory: CTA bilaterally, no wheezing, no crackles, no rhonchi  Abdomen: Soft/+BS, non tender, non distended, no guarding; no hepatosplenomegaly  Extremities: 1+LE edema, No lymphangitis, No petechiae, No rashes, no synovitis; no cyanosis or clubbing  Data Reviewed: Basic Metabolic Panel:  Recent Labs Lab 03/22/15 1021 03/23/15 0603  NA 143 144  K 3.3* 3.9  CL 110 114*  CO2 27 25  GLUCOSE 102* 89  BUN 31* 21*  CREATININE 0.80 0.67  CALCIUM 9.1 8.4*   Liver Function Tests:  Recent Labs Lab 03/22/15 1021  AST 15  ALT 11*  ALKPHOS 37*  BILITOT 0.6  PROT 7.7  ALBUMIN 2.9*    Recent Labs Lab 03/22/15 1021  LIPASE 16   No results for input(s): AMMONIA in the last 168 hours. CBC:  Recent Labs Lab 03/22/15 1021 03/22/15 1844 03/23/15 0603  WBC 9.6 9.3 8.4  NEUTROABS 6.6  --   --   HGB 9.1* 8.8* 7.7*  HCT 27.6* 27.1* 24.1*  MCV 92.6 92.5 94.1  PLT 219 235 203   Cardiac Enzymes: No results for input(s): CKTOTAL, CKMB, CKMBINDEX, TROPONINI in the last 168 hours. BNP: Invalid input(s): POCBNP CBG:  Recent Labs Lab 03/22/15 2159  GLUCAP 113*    Recent Results (from the past 240 hour(s))  MRSA PCR Screening     Status: Abnormal   Collection Time: 03/22/15  7:54 PM  Result Value Ref Range Status   MRSA by PCR POSITIVE (A) NEGATIVE Final    Comment:  The GeneXpert MRSA Assay (FDA approved for NASAL specimens only), is one component of a comprehensive MRSA colonization surveillance program. It is not intended to diagnose MRSA infection nor to guide or monitor  treatment for MRSA infections. RESULT CALLED TO, READ BACK BY AND VERIFIED WITH: K LE RN 2151 03/22/15 A BROWNING      Scheduled Meds: . amLODipine  5 mg Oral Daily  . antiseptic oral rinse  7 mL Mouth Rinse BID  . Chlorhexidine Gluconate Cloth  6 each Topical Q0600  . cholecalciferol  1,000 Units Oral Daily  . docusate sodium  100 mg Oral BID  . ferrous sulfate  325 mg Oral Q breakfast  . mupirocin ointment  1 application Nasal BID  . pantoprazole (PROTONIX) IV  40 mg Intravenous Q12H  . polyethylene glycol  17 g Oral BID   Continuous Infusions: . sodium chloride 100 mL/hr at 03/23/15 0933     Elise Knobloch, DO  [facility] Pager [phone]  If 7PM-7AM, please contact night-coverage [email or web address] Password [email or web address] 03/23/2015, 10:33 AM   LOS: 1 day

## 2015-03-24 DIAGNOSIS — N183 Chronic kidney disease, stage 3 (moderate): Secondary | ICD-10-CM

## 2015-03-24 LAB — CBC
HEMATOCRIT: 23.3 % — AB (ref 36.0–46.0)
Hemoglobin: 7.4 g/dL — ABNORMAL LOW (ref 12.0–15.0)
MCH: 30.2 pg (ref 26.0–34.0)
MCHC: 31.8 g/dL (ref 30.0–36.0)
MCV: 95.1 fL (ref 78.0–100.0)
PLATELETS: 207 10*3/uL (ref 150–400)
RBC: 2.45 MIL/uL — AB (ref 3.87–5.11)
RDW: 15 % (ref 11.5–15.5)
WBC: 9.1 10*3/uL (ref 4.0–10.5)

## 2015-03-24 LAB — H PYLORI, IGM, IGG, IGA AB
H PYLORI IGG: 1.7 U/mL — AB (ref 0.0–0.8)
H. Pylogi, Iga Abs: <9 units (ref 0.0–8.9)

## 2015-03-24 LAB — BASIC METABOLIC PANEL
ANION GAP: 3 — AB (ref 5–15)
BUN: 21 mg/dL — ABNORMAL HIGH (ref 6–20)
CO2: 25 mmol/L (ref 22–32)
Calcium: 8.1 mg/dL — ABNORMAL LOW (ref 8.9–10.3)
Chloride: 115 mmol/L — ABNORMAL HIGH (ref 101–111)
Creatinine, Ser: 0.85 mg/dL (ref 0.44–1.00)
GFR, EST NON AFRICAN AMERICAN: 56 mL/min — AB (ref >60)
GLUCOSE: 97 mg/dL (ref 65–99)
POTASSIUM: 3.6 mmol/L (ref 3.5–5.1)
Sodium: 143 mmol/L (ref 135–145)

## 2015-03-24 LAB — PREPARE RBC (CROSSMATCH)

## 2015-03-24 MED ORDER — CYANOCOBALAMIN 1000 MCG/ML IJ SOLN
1000.0000 ug | Freq: Once | INTRAMUSCULAR | Status: AC
  Administered 2015-03-25: 1000 ug via INTRAMUSCULAR
  Filled 2015-03-24: qty 1

## 2015-03-24 MED ORDER — ENSURE ENLIVE PO LIQD: 237.0000 mL | Freq: Two times a day (BID) | ORAL | Status: DC

## 2015-03-24 MED ORDER — ENSURE ENLIVE PO LIQD
237.0000 mL | Freq: Two times a day (BID) | ORAL | Status: DC
  Administered 2015-03-24 – 2015-03-26 (×5): 237 mL via ORAL

## 2015-03-24 MED ORDER — ADULT MULTIVITAMIN W/MINERALS CH
1.0000 | ORAL_TABLET | Freq: Every day | ORAL | Status: DC
  Administered 2015-03-24 – 2015-03-26 (×3): 1 via ORAL
  Filled 2015-03-24 (×3): qty 1

## 2015-03-24 MED ORDER — VITAMIN B-12 1000 MCG PO TABS
500.0000 ug | ORAL_TABLET | Freq: Every day | ORAL | Status: DC
  Administered 2015-03-26: 500 ug via ORAL
  Filled 2015-03-24: qty 1

## 2015-03-24 MED ORDER — CYANOCOBALAMIN 250 MCG PO TABS: 250.0000 ug | ORAL_TABLET | Freq: Every day | ORAL | Status: DC

## 2015-03-24 MED ORDER — CYANOCOBALAMIN 500 MCG PO TABS: 500.0000 ug | ORAL_TABLET | Freq: Every day | ORAL | Status: DC

## 2015-03-24 MED ORDER — PANTOPRAZOLE SODIUM 40 MG PO TBEC: 40.0000 mg | DELAYED_RELEASE_TABLET | Freq: Two times a day (BID) | ORAL | Status: DC

## 2015-03-24 MED ORDER — SODIUM CHLORIDE 0.9 % IV SOLN: Freq: Once | INTRAVENOUS | Status: DC

## 2015-03-24 MED ORDER — PANTOPRAZOLE SODIUM 40 MG PO TBEC
40.0000 mg | DELAYED_RELEASE_TABLET | Freq: Two times a day (BID) | ORAL | Status: DC
  Administered 2015-03-24 – 2015-03-26 (×5): 40 mg via ORAL
  Filled 2015-03-24 (×5): qty 1

## 2015-03-24 MED ORDER — CYANOCOBALAMIN 1000 MCG/ML IJ SOLN
1000.0000 ug | Freq: Once | INTRAMUSCULAR | Status: AC
Start: 2015-03-24 — End: 2015-03-24
  Administered 2015-03-24: 1000 ug via INTRAMUSCULAR
  Filled 2015-03-24: qty 1

## 2015-03-24 NOTE — Progress Notes (Signed)
Initial Nutrition Assessment  DOCUMENTATION CODES:   Not applicable  INTERVENTION:   -Ensure Enlive po BID, each supplement provides 350 kcal and 20 grams of protein -MVI daily  NUTRITION DIAGNOSIS:   Inadequate oral intake related to poor appetite as evidenced by per patient/family report.  GOAL:   Patient will meet greater than or equal to 90% of their needs  MONITOR:   PO intake, Supplement acceptance, Labs, Weight trends, Skin, I & O's  REASON FOR ASSESSMENT:   Consult Assessment of nutrition requirement/status  ASSESSMENT:   This is a 81 year old female patient with known progressive dementia, limited mobility and primarily sits in chair all day, reported history of diastolic heart failure, as well as hypertension, known diverticulosis, chronic kidney disease stage III, GERD, chronic recurrent constipation who is brought to the ER by her son secondary to progressive weakness and inability to eat/poor appetite. Son reports that over the past several days his mother has stopped eating as much as she typically does and today he noted she had significant difficulty with standing and pivoting with assistance. He also thought he saw spots of blood in her stool and given her prior history of brisk GI bleeding on previous admissions he thought it best to bring the patient to the hospital for evaluation. She was also complaining of epigastric abdominal pain.  Pt admitted with duodenitis, GIB.  Hx obtained from pt son at bedside. He reports that pt has had a poor appetite for a couple of weeks. He denies that pt has had any chewing or swallowing difficulty. Pt confirms that "I just don't feel like eating anything".   Pt son reveals that pt has gotten progressively weaker within the past few days. He reveals that pt can usually assist with transfers, however, is now unable to do so.   Pt son denies that pt has lost weight. Reviewed wt hx, which reveals wt stability over the past > 1  year.   BSE completed on 03/23/15; recommending dysphagia 3 diet. Per son, pt tolerating current texture well. He has not tried supplements with her, but was contemplating Ensure PTA. RD will order and discussed with pt son where these can be purchased in the retail setting. Discussed importance of good meal and supplement intake to promote healing.   Nutrition-Focused physical exam completed. Findings are mild to moderate fat depletion, mild to moderate muscle depletion, and no edema. Suspect fat and muscle depletion is related to advanced age and limited mobility.  Labs reviewed.   Diet Order:  DIET DYS 3 Room service appropriate?: Yes; Fluid consistency:: Thin  Skin:  Wound (see comment) (stage I buttocks)  Last BM:  03/24/15  Height:   Ht Readings from Last 1 Encounters:  03/22/15 5\' 7"  (1.702 m)    Weight:   Wt Readings from Last 1 Encounters:  03/24/15 136 lb 3.9 oz (61.8 kg)    Ideal Body Weight:  61.4 kg  BMI:  Body mass index is 21.33 kg/(m^2).  Estimated Nutritional Needs:   Kcal:  1350-1550  Protein:  65-80 grams  Fluid:  1.3-1.5 L  EDUCATION NEEDS:   Education needs addressed  Riana Tessmer A. Jimmye Norman, RD, LDN, CDE Pager: [phone] After hours Pager: [phone]

## 2015-03-24 NOTE — Discharge Summary (Addendum)
Physician Discharge Summary  Sarah Perkins [id number] DOB: May 02, 1917 DOA: 03/22/2015  PCP: Thressa Sheller, MD  Admit date: 03/22/2015 Discharge date: 03/26/15 Recommendations for Outpatient Follow-up:  1. Pt will need to follow up with PCP in 2 weeks post discharge 2. Please obtain BMP and CBC on 03/31/15 3. Recheck serum B12 in one month   Discharge Diagnoses:  Duodenitis -03/22/2015 CT abdomen and pelvis shows basilar blebs with question of thickening of the duodenal bulb however the images were limited by the patient's movement. Otherwise, there was no acute intra-abdominal findings except for large amount of stool burden -Continue PPI bid x one month, then once daily -Avoid NSAIDs--the patient's son had been giving the patient NSAIDs at home for pain fairly frequently. Constipation -This is contributing to the patient's abdominal pain -The patient will need to be discharged with daily bowel regimen -Continue MiraLAX -Add bisacodyl suppository-->+BM Dehydration -The patient had pre-renal azotemia noted at the time of admission which fits her clinical exam and history -Continue intravenous fluids -Serum creatinine 0.6 on day of discharge Generalized weakness/FTT -Urinalysis is negative for polyuria -TSH 3.146 -nutrition evaluation appreciated-->Ensure Enlive added -Serum B12 256--start cyanocobalamin 1000 mcg IM x2 and start po B12 after discharge History of GI bleed -Baseline hemoglobin 8-9 -Hemoglobin 8.8 at the time of admission -Drop in hemoglobin likely represents dilution secondary to intravenous fluids -09/22/2011 colonoscopy showed pandiverticulosis -Given the patient's age and comorbidities, would opt for conservative therapy and close observation -Patient was taking Advil PM at bedtime at home -Avoid NSAIDs  -Patient has had a bowel movement during this admission without any blood  -Patient received 1 unit of PRBC during admission. -Hemoglobin 9.1  on day of discharge Chronic diastolic CHF -Compensated at this time -05/16/2011 echo shows EF 60-65% -Held home dose of furosemide during hospitalization -resume lasix after d/c Iron deficiency anemia  -Iron saturation 16%, ferritin 31  -Restart iron supplementation  -Given one dose Nulecit -03/24/15--transfuse one unit PRBC Dementia -appreciate speech evaluation-->Dys 3 diet with thin liquids -PT evaluation Hypertension  -Continue amlodipine  CKD stage 2-3 -Baseline creatinine 0.6-0.9  Discharge Condition: stable  Disposition: home  Diet:regular Wt Readings from Last 3 Encounters:  03/24/15 61.8 kg (136 lb 3.9 oz)  10/11/14 60.147 kg (132 lb 9.6 oz)  08/21/14 63.6 kg (140 lb 3.4 oz)      Hospital Course:  81 year old female with history of diastolic CHF, hypertension, diverticular bleed, dementia, CKD stage III, constipation presented with 2-3 day history of progressive weakness and anorexia. At baseline, the patient has poor mobility and essentially sits in her chair most of the day. She is able to help in transfers, but commonly requires the assistance of her son who is her primary caretaker. However, the patient has had progressive weakness to the point where she has had difficulty standing. In addition, the patient has had decreasing oral intake. The patient's son states that she has barely been eating anything for nearly one week. As a result, the patient was brought to emergency department for further evaluation. The patient's son also noted some possible hematochezia. The patient did not have any hematochezia during the hospitalization. Her hemoglobin trended down with intravenous fluids, but there was no overt bleeding. With intravenous fluids,  repletion of B12 the patient perked up and was much more alert, but she remained deconditioned. Physical therapy was consulted. They recommended skilled nursing facility.  Discharge Exam: Filed Vitals:   03/25/15 0215  03/25/15 0538  BP: 144/66 155/57  Pulse: 94 90  Temp: 97.8 F (36.6 C) 97.8 F (36.6 C)  Resp: 20 20   Filed Vitals:   03/24/15 2321 03/24/15 2345 03/25/15 0215 03/25/15 0538  BP: 131/58 127/58 144/66 155/57  Pulse: 91 84 94 90  Temp: 97.8 F (36.6 C) 98.2 F (36.8 C) 97.8 F (36.6 C) 97.8 F (36.6 C)  TempSrc: Oral Oral Oral Oral  Resp: 20 20 20 20   Height:      Weight:      SpO2: 94% 94% 95% 95%   General: Awake and alert, NAD, pleasant, cooperative Cardiovascular: RRR, no rub, no gallop, no S3 Respiratory: Fine bibasilar crackles. No wheezing. Abdomen:soft, nontender, nondistended, positive bowel sounds Extremities: No edema, No lymphangitis, no petechiae  Discharge Instructions      Discharge Instructions    Diet - low sodium heart healthy    Complete by:  As directed      Increase activity slowly    Complete by:  As directed             Medication List    STOP taking these medications        ADVIL PM 200-25 MG Caps  Generic drug:  Ibuprofen-Diphenhydramine HCl      TAKE these medications        amLODipine 5 MG tablet  Commonly known as:  NORVASC  Take 5 mg by mouth daily.     Calcium-Magnesium-Vitamin D 185-50-100 MG-MG-UNIT Caps  Take 1 capsule by mouth daily.     Calcium-Magnesium-Zinc Tabs  Take 1 tablet by mouth daily.     cholecalciferol 1000 UNITS tablet  Commonly known as:  VITAMIN D  Take 1,000 Units by mouth daily.     cyanocobalamin 500 MCG tablet  Take 1 tablet (500 mcg total) by mouth daily.  Start taking on:  03/26/2015     docusate sodium 100 MG capsule  Commonly known as:  COLACE  Take 100 mg by mouth at bedtime.     feeding supplement (ENSURE ENLIVE) Liqd  Take 237 mLs by mouth 2 (two) times daily between meals.     ferrous sulfate 325 (65 FE) MG tablet  Take 325 mg by mouth daily with breakfast.     furosemide 20 MG tablet  Commonly known as:  LASIX  Take 10 mg by mouth daily.     guaifenesin 100 MG/5ML syrup    Commonly known as:  ROBITUSSIN  Take 200 mg by mouth 3 (three) times daily as needed for cough.     pantoprazole 40 MG tablet  Commonly known as:  PROTONIX  Take 1 tablet (40 mg total) by mouth 2 (two) times daily. X one month, then 1 tablet (40mg ) once daily     vitamin C 100 MG tablet  Take 500 mg by mouth daily. With rose hips 500 mg         The results of significant diagnostics from this hospitalization (including imaging, microbiology, ancillary and laboratory) are listed below for reference.    Significant Diagnostic Studies: Ct Abdomen Pelvis W Contrast  03/22/2015  CLINICAL DATA:  Generalized weakness, stomach pain for 2 days, decreased appetite. EXAM: CT ABDOMEN AND PELVIS WITH CONTRAST TECHNIQUE: Multidetector CT imaging of the abdomen and pelvis was performed using the standard protocol following bolus administration of intravenous contrast. CONTRAST:  51mL OMNIPAQUE IOHEXOL 300 MG/ML  SOLN COMPARISON:  CT dated 02/14/2014. FINDINGS: Lower chest: No acute findings. Small emphysematous blebs noted at the bilateral lung bases. Mild chronic scarring/atelectasis also noted at  each lung base. Moderate-sized hiatal hernia again noted without significant change. Hepatobiliary: Layering stones again seen within the nondistended gallbladder. No evidence of acute cholecystitis. No bile duct dilatation. Liver appears normal. Pancreas: Infiltrated with fat throughout, similar to the previous exam. Otherwise unremarkable. Spleen: Within normal limits in size and appearance. Adrenals/Urinary Tract: Small renal cysts bilaterally, unchanged. No acute findings. No renal stone or hydronephrosis. No ureteral or bladder calculi identified. Bladder is decompressed and partially obscured by metallic artifact from patient's right hip prosthesis. Adrenal glands are unremarkable. Stomach/Bowel: There is questionable thickening of the walls of the duodenal bulb, characterization limited by patient movement  artifact. The stomach is nondistended. Remainder of the small bowel is normal in caliber and configuration. Fairly extensive diverticulosis noted throughout the colon but no focal inflammatory change to suggest acute diverticulitis. Appendix is normal. Vascular/Lymphatic: Atherosclerotic changes of the normal - caliber abdominal aorta and branch vessels. Prominent atherosclerotic calcifications seen at the left renal artery ostium. No acute vascular abnormality seen. Reproductive: No mass or other significant abnormality. Presumed hysterectomy. Other: No free fluid or abscess collection identified. No free intraperitoneal air. Musculoskeletal: Degenerative changes are seen throughout the thoracolumbar spine and patient is status post right hip arthroplasty. Old compression fracture deformity of the L1 vertebral body is unchanged. No acute osseous abnormality seen. Periumbilical abdominal hernia again noted which contains fat only. IMPRESSION: 1. Questionable thickening of the walls in the duodenal bulb region which could be neoplastic, infectious or inflammatory in nature. Characterization of this area, however, is limited by patient motion artifact. Could consider repeat CT of the abdomen or endoscopic evaluation. 2. Extensive colonic diverticulosis without evidence of acute diverticulitis. 3. Cholelithiasis without evidence of acute cholecystitis. 4. Additional chronic/incidental findings detailed above. Electronically Signed   By: Franki Cabot M.D.   On: 03/22/2015 12:38     Microbiology: Recent Results (from the past 240 hour(s))  MRSA PCR Screening     Status: Abnormal   Collection Time: 03/22/15  7:54 PM  Result Value Ref Range Status   MRSA by PCR POSITIVE (A) NEGATIVE Final    Comment:        The GeneXpert MRSA Assay (FDA approved for NASAL specimens only), is one component of a comprehensive MRSA colonization surveillance program. It is not intended to diagnose MRSA infection nor to guide  or monitor treatment for MRSA infections. RESULT CALLED TO, READ BACK BY AND VERIFIED WITH: K LE RN [id number] 03/22/15 A BROWNING      Labs: Basic Metabolic Panel:  Recent Labs Lab 03/22/15 1021 03/23/15 0603 03/24/15 0339 03/25/15 0602  NA 143 144 143 141  K 3.3* 3.9 3.6 3.6  CL 110 114* 115* 113*  CO2 27 25 25 24   GLUCOSE 102* 89 97 100*  BUN 31* 21* 21* 18  CREATININE 0.80 0.67 0.85 0.69  CALCIUM 9.1 8.4* 8.1* 8.3*   Liver Function Tests:  Recent Labs Lab 03/22/15 1021  AST 15  ALT 11*  ALKPHOS 37*  BILITOT 0.6  PROT 7.7  ALBUMIN 2.9*    Recent Labs Lab 03/22/15 1021  LIPASE 16   No results for input(s): AMMONIA in the last 168 hours. CBC:  Recent Labs Lab 03/22/15 1021 03/22/15 1844 03/23/15 0603 03/24/15 0339 03/25/15 0602  WBC 9.6 9.3 8.4 9.1 9.4  NEUTROABS 6.6  --   --   --   --   HGB 9.1* 8.8* 7.7* 7.4* 9.1*  HCT 27.6* 27.1* 24.1* 23.3* 28.7*  MCV 92.6 92.5 94.1  95.1 92.9  PLT 219 235 203 207 206   Cardiac Enzymes: No results for input(s): CKTOTAL, CKMB, CKMBINDEX, TROPONINI in the last 168 hours. BNP: Invalid input(s): POCBNP CBG:  Recent Labs Lab 03/22/15 2159  GLUCAP 113*    Time coordinating discharge:  Greater than 30 minutes  Signed:  Tallan Sandoz, DO [facility] Pager: [phone] 03/25/2015, 7:55 AM

## 2015-03-24 NOTE — Progress Notes (Signed)
PROGRESS NOTE  Sarah Perkins [id number] DOB: [birth year] DOA: 03/22/2015 PCP: Thressa Sheller, MD   Brief history 81 year old female with history of diastolic CHF, hypertension, diverticular bleed, dementia, CKD stage III, constipation presented with 2-3 day history of progressive weakness and anorexia. At baseline, the patient has poor mobility and essentially sits in her chair most of the day. She is able to help in transfers, but commonly requires the assistance of her son who is her primary caretaker. However, the patient has had progressive weakness to the point where she has had difficulty standing. In addition, the patient has had decreasing oral intake. The patient's son states that she has barely been eating anything for nearly one week.  As a result, the patient was brought to emergency department for further evaluation. The patient's son also noted some possible hematochezia. Assessment/Plan: Duodenitis -03/22/2015 CT abdomen and pelvis shows basilar blebs with question of thickening of the duodenal bulb however the images were limited by the patient's movement. Otherwise, there was no acute intra-abdominal findings except for large amount of stool burden -Continue PPI -Avoid NSAIDs--the patient's son had been giving the patient NSAIDs at home for pain fairly frequently. Constipation -This is contributing to the patient's abdominal pain -The patient will need to be discharged with daily bowel regimen -Continue MiraLAX -Add bisacodyl suppository-->+BM Dehydration -The patient had pre-renal azotemia noted at the time of admission which fits her clinical exam and history -Continue intravenous fluids Generalized weakness/FTT -Urinalysis is negative for polyuria -TSH 3.146 -nutrition evaluation appreciated-->Ensure Enlive added -Serum B12 256--start cyanocobalamin 1000 mcg IM during today and start po B12 after discharge History of GI bleed -Baseline hemoglobin  8-9 -Hemoglobin 8.8 at the time of admission -Drop in hemoglobin likely represents dilution secondary to intravenous fluids -09/22/2011 colonoscopy showed pandiverticulosis -Given the patient's age and comorbidities, would opt for conservative therapy and close observation -Patient was taking Advil PM at bedtime at home -Avoid NSAIDs  -Patient has had a bowel movement during this admission without any blood  Chronic diastolic CHF -Compensated at this time -05/16/2011 echo shows EF 60-65% -Hold home dose of furosemide and monitor clinically Iron deficiency anemia  -Iron saturation 16%, ferritin 31  -Restart iron supplementation  -Given one dose Nulecit Dementia -appreciate speech evaluation-->Dys 3 diet with thin liquids -PT evaluation Hypertension  -Continue amlodipine  CKD stage 2-3 -Baseline creatinine 0.6-0.9   Family Communication: Son updated at beside 11/25--total time 35 min Disposition Plan: SNF 03/25/15 if stable     Procedures/Studies: Ct Abdomen Pelvis W Contrast  03/22/2015  CLINICAL DATA:  Generalized weakness, stomach pain for 2 days, decreased appetite. EXAM: CT ABDOMEN AND PELVIS WITH CONTRAST TECHNIQUE: Multidetector CT imaging of the abdomen and pelvis was performed using the standard protocol following bolus administration of intravenous contrast. CONTRAST:  27mL OMNIPAQUE IOHEXOL 300 MG/ML  SOLN COMPARISON:  CT dated 02/14/2014. FINDINGS: Lower chest: No acute findings. Small emphysematous blebs noted at the bilateral lung bases. Mild chronic scarring/atelectasis also noted at each lung base. Moderate-sized hiatal hernia again noted without significant change. Hepatobiliary: Layering stones again seen within the nondistended gallbladder. No evidence of acute cholecystitis. No bile duct dilatation. Liver appears normal. Pancreas: Infiltrated with fat throughout, similar to the previous exam. Otherwise unremarkable. Spleen: Within normal limits in size and  appearance. Adrenals/Urinary Tract: Small renal cysts bilaterally, unchanged. No acute findings. No renal stone or hydronephrosis. No ureteral or bladder calculi identified. Bladder is decompressed and partially obscured  by metallic artifact from patient's right hip prosthesis. Adrenal glands are unremarkable. Stomach/Bowel: There is questionable thickening of the walls of the duodenal bulb, characterization limited by patient movement artifact. The stomach is nondistended. Remainder of the small bowel is normal in caliber and configuration. Fairly extensive diverticulosis noted throughout the colon but no focal inflammatory change to suggest acute diverticulitis. Appendix is normal. Vascular/Lymphatic: Atherosclerotic changes of the normal - caliber abdominal aorta and branch vessels. Prominent atherosclerotic calcifications seen at the left renal artery ostium. No acute vascular abnormality seen. Reproductive: No mass or other significant abnormality. Presumed hysterectomy. Other: No free fluid or abscess collection identified. No free intraperitoneal air. Musculoskeletal: Degenerative changes are seen throughout the thoracolumbar spine and patient is status post right hip arthroplasty. Old compression fracture deformity of the L1 vertebral body is unchanged. No acute osseous abnormality seen. Periumbilical abdominal hernia again noted which contains fat only. IMPRESSION: 1. Questionable thickening of the walls in the duodenal bulb region which could be neoplastic, infectious or inflammatory in nature. Characterization of this area, however, is limited by patient motion artifact. Could consider repeat CT of the abdomen or endoscopic evaluation. 2. Extensive colonic diverticulosis without evidence of acute diverticulitis. 3. Cholelithiasis without evidence of acute cholecystitis. 4. Additional chronic/incidental findings detailed above. Electronically Signed   By: Franki Cabot M.D.   On: 03/22/2015 12:38         Subjective: Patient denies fevers, chills, headache, chest pain, dyspnea, nausea, vomiting, diarrhea, abdominal pain, dysuria, hematuria   Objective: Filed Vitals:   03/23/15 2202 03/24/15 0328 03/24/15 0653 03/24/15 1338  BP: 134/51  137/60 128/60  Pulse: 89  91 84  Temp: 99.4 F (37.4 C)  99.5 F (37.5 C) 98.5 F (36.9 C)  TempSrc: Oral  Oral Oral  Resp: 16  18 20   Height:      Weight:  61.8 kg (136 lb 3.9 oz)    SpO2: 92%  95% 92%    Intake/Output Summary (Last 24 hours) at 03/24/15 1530 Last data filed at 03/24/15 1300  Gross per 24 hour  Intake      0 ml  Output      4 ml  Net     -4 ml   Weight change: 0.292 kg (10.3 oz) Exam:   General:  Pt is alert, follows commands appropriately, not in acute distress  HEENT: No icterus, No thrush, No neck mass, Ryan/AT  Cardiovascular: RRR, S1/S2, no rubs, no gallops  Respiratory: Fine bibasilar crackles. No wheezing.   Abdomen: Soft/+BS, non tender, non distended, no guarding  Extremities: No edema, No lymphangitis, No petechiae, No rashes, no synovitis  Data Reviewed: Basic Metabolic Panel:  Recent Labs Lab 03/22/15 1021 03/23/15 0603 03/24/15 0339  NA 143 144 143  K 3.3* 3.9 3.6  CL 110 114* 115*  CO2 27 25 25   GLUCOSE 102* 89 97  BUN 31* 21* 21*  CREATININE 0.80 0.67 0.85  CALCIUM 9.1 8.4* 8.1*   Liver Function Tests:  Recent Labs Lab 03/22/15 1021  AST 15  ALT 11*  ALKPHOS 37*  BILITOT 0.6  PROT 7.7  ALBUMIN 2.9*    Recent Labs Lab 03/22/15 1021  LIPASE 16   No results for input(s): AMMONIA in the last 168 hours. CBC:  Recent Labs Lab 03/22/15 1021 03/22/15 1844 03/23/15 0603 03/24/15 0339  WBC 9.6 9.3 8.4 9.1  NEUTROABS 6.6  --   --   --   HGB 9.1* 8.8* 7.7* 7.4*  HCT 27.6*  27.1* 24.1* 23.3*  MCV 92.6 92.5 94.1 95.1  PLT 219 235 203 207   Cardiac Enzymes: No results for input(s): CKTOTAL, CKMB, CKMBINDEX, TROPONINI in the last 168 hours. BNP: Invalid input(s):  POCBNP CBG:  Recent Labs Lab 03/22/15 2159  GLUCAP 113*    Recent Results (from the past 240 hour(s))  MRSA PCR Screening     Status: Abnormal   Collection Time: 03/22/15  7:54 PM  Result Value Ref Range Status   MRSA by PCR POSITIVE (A) NEGATIVE Final    Comment:        The GeneXpert MRSA Assay (FDA approved for NASAL specimens only), is one component of a comprehensive MRSA colonization surveillance program. It is not intended to diagnose MRSA infection nor to guide or monitor treatment for MRSA infections. RESULT CALLED TO, READ BACK BY AND VERIFIED WITH: K LE RN 2151 03/22/15 A BROWNING      Scheduled Meds: . amLODipine  5 mg Oral Daily  . antiseptic oral rinse  7 mL Mouth Rinse BID  . Chlorhexidine Gluconate Cloth  6 each Topical Q0600  . cholecalciferol  1,000 Units Oral Daily  . [START ON 03/25/2015] cyanocobalamin  1,000 mcg Intramuscular Once  . docusate sodium  100 mg Oral BID  . feeding supplement (ENSURE ENLIVE)  237 mL Oral BID BM  . ferrous sulfate  325 mg Oral Q breakfast  . multivitamin with minerals  1 tablet Oral Daily  . mupirocin ointment  1 application Nasal BID  . pantoprazole  40 mg Oral BID  . polyethylene glycol  17 g Oral BID  . [START ON 03/26/2015] vitamin B-12  500 mcg Oral Daily   Continuous Infusions: . sodium chloride 75 mL/hr at 03/23/15 2212     Shenique Childers, DO  [facility] Pager [phone]  If 7PM-7AM, please contact night-coverage [email or web address] Password [email or web address] 03/24/2015, 3:30 PM   LOS: 2 days

## 2015-03-24 NOTE — Evaluation (Signed)
Occupational Therapy Evaluation Patient Details Name: Sarah Perkins MRN: [id number] DOB: [birth year] Today's Date: 03/24/2015    History of Present Illness  This 81 y.o. Female admitted with progressive weakness and anorexia.  Dx: Duodenitis, constipation, dehydration, FTT.  PMH includes:  Dementia, h/o GI bleed, Chronic diastolic HF, HTN, CKD.     Clinical Impression   Pt admitted with above. She demonstrates the below listed deficits and will benefit from continued OT to maximize safety and independence with BADLs.  Pt presents with h/o dementia and generalized weakness, as well as impaired balance.  She lives with son and was previously able to transfer with supervision - min A, and was able to assist minimally with ADLs.  Currently, she requires max A for functional mobility and max - total A with ADLs.  Son needs pt to be able return to min A with transfers in order to manage her at home.  Recommend SNF level rehab.  Son reports she has been to [facility] and [facility] in the past, and would prefer one of these options.        Follow Up Recommendations  SNF    Equipment Recommendations  None recommended by OT    Recommendations for Other Services       Precautions / Restrictions Precautions Precautions: Fall      Mobility Bed Mobility               General bed mobility comments: PT sitting up in chair  Transfers Overall transfer level: Needs assistance   Transfers: Sit to/from Stand Sit to Stand: Max assist         General transfer comment: Pt with heavy posterior lean.  She requires verbal cues to walk feet back  under her as she tends to stand with feet anteriorly to hips.  Able to correct posture with max cues and assist, but fatigues quickly     Balance Overall balance assessment: Needs assistance Sitting-balance support: Feet supported Sitting balance-Leahy Scale: Fair Sitting balance - Comments: sitting EOC   Standing balance support:  Bilateral upper extremity supported Standing balance-Leahy Scale: Zero Standing balance comment: requires max A to maintain standing balance - strong posterior lean                             ADL Overall ADL's : Needs assistance/impaired Eating/Feeding: Set up;Minimal assistance;Sitting   Grooming: Wash/dry hands;Wash/dry face;Supervision/safety;Sitting   Upper Body Bathing: Maximal assistance;Sitting   Lower Body Bathing: Total assistance;Sit to/from stand   Upper Body Dressing : Maximal assistance;Sitting   Lower Body Dressing: Total assistance;Sit to/from stand   Toilet Transfer: Maximal assistance;Stand-pivot;BSC Toilet Transfer Details (indicate cue type and reason): Pt with heavy posterior lean  Toileting- Clothing Manipulation and Hygiene: Total assistance;Sit to/from stand       Functional mobility during ADLs: Maximal assistance;+2 for physical assistance       Vision     Perception     Praxis      Pertinent Vitals/Pain Pain Assessment: No/denies pain     Hand Dominance Right   Extremity/Trunk Assessment Upper Extremity Assessment Upper Extremity Assessment: Generalized weakness   Lower Extremity Assessment Lower Extremity Assessment: Defer to PT evaluation   Cervical / Trunk Assessment Cervical / Trunk Assessment: Kyphotic   Communication Communication Communication: HOH   Cognition Arousal/Alertness: Awake/alert Behavior During Therapy: WFL for tasks assessed/performed Overall Cognitive Status: History of cognitive impairments - at baseline  General Comments       Exercises       Shoulder Instructions      Home Living Family/patient expects to be discharged to:: Skilled nursing facility                                 Additional Comments: Pt has been living with son who was providing 24 hour assist       Prior Functioning/Environment Level of Independence: Needs assistance   Gait / Transfers Assistance Needed: Pt mainly transfers to w/c with supervision - min A.  Occasionally can take a few steps with RW with son's assist ADL's / Homemaking Assistance Needed: Pt able to don shirt, max A for LB dressing, max - total A for bathing, total A for toileting, set up assist for feeding         OT Diagnosis: Generalized weakness;Cognitive deficits   OT Problem List: Decreased strength;Impaired balance (sitting and/or standing);Decreased safety awareness;Decreased knowledge of use of DME or AE   OT Treatment/Interventions: Self-care/ADL training;DME and/or AE instruction;Therapeutic activities;Cognitive remediation/compensation;Patient/family education;Balance training    OT Goals(Current goals can be found in the care plan section) Acute Rehab OT Goals Patient Stated Goal: son hopes that she will be able to stand and transfer with min A or less  OT Goal Formulation: With patient/family Time For Goal Achievement: 04/07/15 Potential to Achieve Goals: Good ADL Goals Pt Will Perform Grooming: with set-up;sitting Pt Will Perform Upper Body Dressing: with mod assist;sitting Pt Will Transfer to Toilet: with mod assist;stand pivot transfer;bedside commode  OT Frequency: Min 2X/week   Barriers to D/C: Decreased caregiver support          Co-evaluation              End of Session Equipment Utilized During Treatment: Rolling walker Nurse Communication: Mobility status  Activity Tolerance: Patient tolerated treatment well Patient left: in chair;with call bell/phone within reach;with chair alarm set;with family/visitor present   Time: 1519-1540 OT Time Calculation (min): 21 min Charges:  OT General Charges $OT Visit: 1 Procedure OT Evaluation $Initial OT Evaluation Tier I: 1 Procedure G-Codes:    Lucille Passy M 04-15-2015, 3:53 PM

## 2015-03-24 NOTE — Evaluation (Signed)
Physical Therapy Evaluation Patient Details Name: Sarah Perkins MRN: [id number] DOB: [birth year] Today's Date: 03/24/2015   History of Present Illness  This 81 y.o. Female admitted with progressive weakness and anorexia. Dx: Duodenitis, constipation, dehydration, FTT. PMH includes: Dementia, h/o GI bleed, Chronic diastolic HF, HTN, CKD.   Clinical Impression  Pt admitted with above diagnosis. Pt currently with functional limitations due to the deficits listed below (see PT Problem List). PTA Sarah Perkins required sup>min A for stand pivot transfer and 24/7 supervision.  She currently requires +2 mod A for safe stand pivot transfer and will need to go to SNF to return to PLOF.  Pt will benefit from skilled PT to increase their independence and safety with mobility to allow discharge to the venue listed below.      Follow Up Recommendations SNF;Supervision/Assistance - 24 hour    Equipment Recommendations  Other (comment) (TBD at next venue of care)    Recommendations for Other Services       Precautions / Restrictions Precautions Precautions: Fall Restrictions Weight Bearing Restrictions: No      Mobility  Bed Mobility Overal bed mobility: Needs Assistance;+2 for physical assistance Bed Mobility: Supine to Sit     Supine to sit: Mod assist;+2 for physical assistance     General bed mobility comments: A to trunk to achieve sitting.  Use of bed pad to scoot to EOB.  Increased time and pt uses bed rails.  Transfers Overall transfer level: Needs assistance Equipment used: Rolling walker (2 wheeled) Transfers: Sit to/from [facility] Sit to Stand: Mod assist;+2 physical assistance;+2 safety/equipment Stand pivot transfers: Mod assist;+2 physical assistance;+2 safety/equipment       General transfer comment: Pt with heavy posterior lean.  She requires verbal cues to walk feet back  under her as she tends to stand with feet anteriorly to hips.  A to  stabilize and prevent posterior lean and to manage RW.  Step by step verbal cues for sequencing and tactile cues to Lt LE to initiate side stepping.  Ambulation/Gait                Stairs            Wheelchair Mobility    Modified Rankin (Stroke Patients Only)       Balance Overall balance assessment: Needs assistance Sitting-balance support: Bilateral upper extremity supported;Feet supported Sitting balance-Sarah Perkins: Fair Sitting balance - Comments: Min guard provided Postural control: Posterior lean Standing balance support: Bilateral upper extremity supported;During functional activity Standing balance-Sarah Perkins: Zero Standing balance comment: requries support from RW and +2 assist to maintain upright                              Pertinent Vitals/Pain Pain Assessment: No/denies pain    Home Living Family/patient expects to be discharged to:: Skilled nursing facility Living Arrangements: Children               Additional Comments: Pt has been living with son who was providing 24 hour assist     Prior Function Level of Independence: Needs assistance   Gait / Transfers Assistance Needed: Pt mainly transfers to w/c with supervision - min A.  Occasionally can take a few steps with RW with son's assist  ADL's / Homemaking Assistance Needed: Pt able to don shirt, max A for LB dressing, max - total A for bathing, total A for toileting, set up assist for feeding  Hand Dominance   Dominant Hand: Right    Extremity/Trunk Assessment   Upper Extremity Assessment: Defer to OT evaluation           Lower Extremity Assessment: Generalized weakness      Cervical / Trunk Assessment: Kyphotic  Communication   Communication: HOH  Cognition Arousal/Alertness: Awake/alert Behavior During Therapy: WFL for tasks assessed/performed Overall Cognitive Status: History of cognitive impairments - at baseline                       General Comments      Exercises General Exercises - Lower Extremity Ankle Circles/Pumps: AROM;Both;10 reps;Seated Long Arc Quad: AROM;Both;10 reps;Seated      Assessment/Plan    PT Assessment Patient needs continued PT services  PT Diagnosis Difficulty walking;Generalized weakness   PT Problem List Decreased strength;Decreased range of motion;Decreased activity tolerance;Decreased balance;Decreased mobility;Decreased cognition;Decreased knowledge of use of DME;Decreased safety awareness;Decreased knowledge of precautions  PT Treatment Interventions DME instruction;Gait training;Functional mobility training;Therapeutic activities;Therapeutic exercise;Balance training;Neuromuscular re-education;Cognitive remediation;Patient/family education;Wheelchair mobility training   PT Goals (Current goals can be found in the Care Plan section) Acute Rehab PT Goals Patient Stated Goal: pt unable and no family member present PT Goal Formulation: Patient unable to participate in goal setting Time For Goal Achievement: 04/14/15 Potential to Achieve Goals: Good    Frequency Min 2X/week   Barriers to discharge   Pt requires +2 mod A and will need 24/7 supervision    Co-evaluation               End of Session Equipment Utilized During Treatment: Gait belt Activity Tolerance: Patient tolerated treatment well;Patient limited by fatigue Patient left: in chair;with call bell/phone within reach;with chair alarm set Nurse Communication: Mobility status;Precautions         Time: [id number] PT Time Calculation (min) (ACUTE ONLY): 22 min   Charges:   PT Evaluation $Initial PT Evaluation Tier I: 1 Procedure     PT G CodesJoslyn Hy PT, DPT [phone] Pager: [phone] 03/24/2015, 4:12 PM

## 2015-03-24 NOTE — NC FL2 (Signed)
[place] LEVEL OF CARE SCREENING TOOL     IDENTIFICATION  Patient Name: Sarah Perkins Birthdate: [birth year] Sex: female Admission Date (Current Location): 03/22/2015  [facility] and [place] Number: Herbalist and Address:  [facility], [place] [address] [place], [place] [address]      Provider Number: [id number]  Attending Physician Name and Address:  Orson Eva, MD  Relative Name and Phone Number:       Current Level of Care: Hospital Recommended Level of Care: [place] Prior Approval Number:    Date Approved/Denied:   PASRR Number: [id number]  Discharge Plan: SNF    Current Diagnoses: Patient Active Problem List   Diagnosis Date Noted  . Pressure ulcer 03/23/2015  . CKD (chronic kidney disease) stage 3, GFR 30-59 ml/min 03/23/2015  . Duodenitis without bleeding 03/22/2015  . Azotemia 03/22/2015  . Acute hypokalemia 03/22/2015  . Constipation   . Cough   . Palliative care encounter   . Anemia 10/08/2014  . Rectal bleeding 08/21/2014  . Chronic diastolic CHF (congestive heart failure) ([place]) 02/22/2014  . GIB (gastrointestinal bleeding) 02/14/2014  . Abnormal EKG 02/14/2014  . Dementia 01/28/2013  . Acute posthemorrhagic anemia 09/20/2011  . HTN (hypertension) 05/15/2011  . CKD (chronic kidney disease), stage I 05/15/2011  . GERD (gastroesophageal reflux disease) 05/15/2011  . Diverticulosis 05/15/2011    Orientation ACTIVITIES/SOCIAL BLADDER RESPIRATION    Self, Place  Family supportive Incontinent Normal  BEHAVIORAL SYMPTOMS/MOOD NEUROLOGICAL BOWEL NUTRITION STATUS      Incontinent  (See DC Summary)  PHYSICIAN VISITS COMMUNICATION OF NEEDS Height & Weight Skin    Verbally   136 lbs. PU Stage and Appropriate Care PU Stage 1 Dressing:  (See DC Summary)        AMBULATORY STATUS RESPIRATION    Assist extensive Normal      Personal Care Assistance Level of Assistance  Bathing,  Feeding, Dressing Bathing Assistance: Maximum assistance Feeding assistance: Maximum assistance Dressing Assistance: Maximum assistance      Functional Limitations Info                [place]  PT (By licensed PT)     PT Frequency: 5x/week             Additional Factors Info  Code Status, Allergies Code Status Info: DNR Allergies Info: NKA           Current Medications (03/24/2015): Current Facility-Administered Medications  Medication Dose Route Frequency Provider Last Rate Last Dose  . 0.9 %  sodium chloride infusion   Intravenous Continuous Orson Eva, MD 50 mL/hr at 03/24/15 1555    . acetaminophen (TYLENOL) tablet 650 mg  650 mg Oral Q6H PRN Samella Parr, NP       Or  . acetaminophen (TYLENOL) suppository 650 mg  650 mg Rectal Q6H PRN Samella Parr, NP      . amLODipine (NORVASC) tablet 5 mg  5 mg Oral Daily Belkys A Regalado, MD   5 mg at 03/24/15 0942  . antiseptic oral rinse (CPC / CETYLPYRIDINIUM CHLORIDE 0.05%) solution 7 mL  7 mL Mouth Rinse BID Belkys A Regalado, MD   7 mL at 03/24/15 1000  . Chlorhexidine Gluconate Cloth 2 % PADS 6 each  6 each Topical Q0600 Elmarie Shiley, MD   6 each at 03/24/15 0651  . cholecalciferol (VITAMIN D) tablet 1,000 Units  1,000 Units Oral Daily Samella Parr,  NP   1,000 Units at 03/24/15 0943  . [START ON 03/25/2015] cyanocobalamin ((VITAMIN B-12)) injection 1,000 mcg  1,000 mcg Intramuscular Once Shanon Brow Kahiau Schewe, MD      . docusate sodium (COLACE) capsule 100 mg  100 mg Oral BID Samella Parr, NP   100 mg at 03/24/15 0942  . feeding supplement (ENSURE ENLIVE) (ENSURE ENLIVE) liquid 237 mL  237 mL Oral BID BM Jenifer A Williams, RD   237 mL at 03/24/15 1520  . ferrous sulfate tablet 325 mg  325 mg Oral Q breakfast Samella Parr, NP   325 mg at 03/24/15 0801  . multivitamin with minerals tablet 1 tablet  1 tablet Oral Daily Jenifer A Jimmye Norman, RD   1 tablet at 03/24/15 1520  . mupirocin ointment  (BACTROBAN) 2 % 1 application  1 application Nasal BID Elmarie Shiley, MD   1 application at [id number]  . ondansetron (ZOFRAN) tablet 4 mg  4 mg Oral Q6H PRN Samella Parr, NP       Or  . ondansetron [facility]) injection 4 mg  4 mg Intravenous Q6H PRN Samella Parr, NP      . pantoprazole (PROTONIX) EC tablet 40 mg  40 mg Oral BID Valeda Malm Rumbarger, RPH   40 mg at 03/24/15 0943  . polyethylene glycol (MIRALAX / GLYCOLAX) packet 17 g  17 g Oral BID Samella Parr, NP   17 g at 03/24/15 0943  . [START ON 03/26/2015] vitamin B-12 (CYANOCOBALAMIN) tablet 500 mcg  500 mcg Oral Daily Orson Eva, MD       Do not use this list as official medication orders. Please verify with discharge summary.  Discharge Medications:   Medication List    ASK your doctor about these medications        ADVIL PM 200-25 MG Caps  Generic drug:  Ibuprofen-Diphenhydramine HCl  Take 1 capsule by mouth at bedtime as needed (Sleep/pain).     amLODipine 5 MG tablet  Commonly known as:  NORVASC  Take 5 mg by mouth daily.     Calcium-Magnesium-Vitamin D 185-50-100 MG-MG-UNIT Caps  Take 1 capsule by mouth daily.     Calcium-Magnesium-Zinc Tabs  Take 1 tablet by mouth daily.     cholecalciferol 1000 UNITS tablet  Commonly known as:  VITAMIN D  Take 1,000 Units by mouth daily.     docusate sodium 100 MG capsule  Commonly known as:  COLACE  Take 100 mg by mouth at bedtime.     ferrous sulfate 325 (65 FE) MG tablet  Take 325 mg by mouth daily with breakfast.     furosemide 20 MG tablet  Commonly known as:  LASIX  Take 10 mg by mouth daily.     guaifenesin 100 MG/5ML syrup  Commonly known as:  ROBITUSSIN  Take 200 mg by mouth 3 (three) times daily as needed for cough.     vitamin C 100 MG tablet  Take 500 mg by mouth daily. With rose hips 500 mg        Relevant Imaging Results:  Relevant Lab Results:  Recent Labs    Additional Information Contact precautions: MRSA  Benard Halsted,  LCSW

## 2015-03-25 LAB — CBC
HCT: 28.7 % — ABNORMAL LOW (ref 36.0–46.0)
Hemoglobin: 9.1 g/dL — ABNORMAL LOW (ref 12.0–15.0)
MCH: 29.4 pg (ref 26.0–34.0)
MCHC: 31.7 g/dL (ref 30.0–36.0)
MCV: 92.9 fL (ref 78.0–100.0)
PLATELETS: 206 10*3/uL (ref 150–400)
RBC: 3.09 MIL/uL — ABNORMAL LOW (ref 3.87–5.11)
RDW: 15.7 % — AB (ref 11.5–15.5)
WBC: 9.4 10*3/uL (ref 4.0–10.5)

## 2015-03-25 LAB — BASIC METABOLIC PANEL
Anion gap: 4 — ABNORMAL LOW (ref 5–15)
BUN: 18 mg/dL (ref 6–20)
CO2: 24 mmol/L (ref 22–32)
CREATININE: 0.69 mg/dL (ref 0.44–1.00)
Calcium: 8.3 mg/dL — ABNORMAL LOW (ref 8.9–10.3)
Chloride: 113 mmol/L — ABNORMAL HIGH (ref 101–111)
GFR calc Af Amer: >60 mL/min (ref >60)
Glucose, Bld: 100 mg/dL — ABNORMAL HIGH (ref 65–99)
Potassium: 3.6 mmol/L (ref 3.5–5.1)
SODIUM: 141 mmol/L (ref 135–145)

## 2015-03-25 LAB — TYPE AND SCREEN
ABO/RH(D): B POS
Antibody Screen: NEGATIVE
DONOR AG TYPE: NEGATIVE
UNIT DIVISION: 0

## 2015-03-25 NOTE — Progress Notes (Signed)
03/25/15 Patient to go to [facility] when bed is available. Report to be called to facility when bed is ready.

## 2015-03-25 NOTE — Clinical Social Work Note (Signed)
Clinical Social Work Assessment  Patient Details  Name: Sarah Perkins MRN: [id number] Date of Birth: 04/03/1918  Date of referral:  03/25/15               Reason for consult:  Facility Placement                Permission sought to share information with:  Family Supports Permission granted to share information::  Yes, Verbal Permission Granted  Name::     [facility]  Agency::     Relationship::  Management consultant Information:     Housing/Transportation Living arrangements for the past 2 months:  Single Family Home Source of Information:  Adult Children Patient Interpreter Needed:  None Criminal Activity/Legal Involvement Pertinent to Current Situation/Hospitalization:  No - Comment as needed Significant Relationships:  Adult Children Lives with:  Adult Children Do you feel safe going back to the place where you live?  Yes Need for family participation in patient care:  Yes (Comment)  Care giving concerns: Patient's son [name] lives with her and provides for her care. [name] states that he is unable to care for her in her current condition. He is in agreement with SNF placement for short term rehab. He states that he has a preference for [facility] or [facility]. CSW helped him understand that placement cannot wait for a facility of choice but must we will seek placement with chosen facilities first. [name] identified understanding.    Social Worker assessment / plan: Complete FL2 and fax to [facility] facilities.  Employment status:  Retired Health visitor, Managed Care PT Recommendations:  [place] / Referral to community resources:  [place]  Patient/Family's Response to care: Family feels good about care plan and about patient's prognosis  Patient/Family's Understanding of and Emotional Response to Diagnosis, Current Treatment, and Prognosis:  Family not expressing distress about  patient's health at this time.  Emotional Assessment Appearance:  Appears younger than stated age Attitude/Demeanor/Rapport:   (calm and appropriate) Affect (typically observed):  Accepting, Appropriate Orientation:  Oriented to Self, Oriented to Place, Oriented to  Time, Oriented to Situation Alcohol / Substance use:  Not Applicable Psych involvement (Current and /or in the community):  No (Comment)  Discharge Needs  Concerns to be addressed:  No discharge needs identified Readmission within the last 30 days:  No Current discharge risk:  None Barriers to Discharge:  No Barriers Identified   Christene Lye, LCSW 03/25/2015, 5:14 PM

## 2015-03-25 NOTE — Care Management Important Message (Signed)
Important Message  Patient Details  Name: Sarah Perkins MRN: [id number] Date of Birth: December 20, 1917   Medicare Important Message Given:  Yes    Erenest Rasher, RN 03/25/2015, 1:56 PM

## 2015-03-25 NOTE — Clinical Social Work Placement (Signed)
   CLINICAL SOCIAL WORK PLACEMENT  NOTE  Date:  03/25/2015  Patient Details  Name: Sarah Perkins MRN: [id number] Date of Birth: 1917-08-31  Clinical Social Work is seeking post-discharge placement for this patient at the [place] level of care (*CSW will initial, date and re-position this form in  chart as items are completed):  Yes   Patient/family provided with [facility] Work Department's list of facilities offering this level of care within the geographic area requested by the patient (or if unable, by the patient's family).  Yes   Patient/family informed of their freedom to choose among providers that offer the needed level of care, that participate in Medicare, Medicaid or managed care program needed by the patient, have an available bed and are willing to accept the patient.  Yes   Patient/family informed of [facility] ownership interest in [facility] and [facility], as well as of the fact that they are under no obligation to receive care at these facilities.  PASRR submitted to EDS on 03/25/15     PASRR number received on 03/25/15     Existing PASRR number confirmed on       FL2 transmitted to all facilities in geographic area requested by pt/family on 03/25/15     FL2 transmitted to all facilities within larger geographic area on 03/25/15     Patient informed that his/her managed care company has contracts with or will negotiate with certain facilities, including the following:            Patient/family informed of bed offers received.  Patient chooses bed at       Physician recommends and patient chooses bed at      Patient to be transferred to   on  .  Patient to be transferred to facility by       Patient family notified on   of transfer.  Name of family member notified:        PHYSICIAN       Additional Comment:    _______________________________________________ Christene Lye, LCSW 03/25/2015,  5:27 PM

## 2015-03-26 DIAGNOSIS — R627 Adult failure to thrive: Secondary | ICD-10-CM | POA: Diagnosis not present

## 2015-03-26 DIAGNOSIS — R262 Difficulty in walking, not elsewhere classified: Secondary | ICD-10-CM | POA: Diagnosis not present

## 2015-03-26 DIAGNOSIS — N182 Chronic kidney disease, stage 2 (mild): Secondary | ICD-10-CM | POA: Diagnosis not present

## 2015-03-26 DIAGNOSIS — K5791 Diverticulosis of intestine, part unspecified, without perforation or abscess with bleeding: Secondary | ICD-10-CM | POA: Diagnosis not present

## 2015-03-26 DIAGNOSIS — E860 Dehydration: Secondary | ICD-10-CM | POA: Diagnosis not present

## 2015-03-26 DIAGNOSIS — L8990 Pressure ulcer of unspecified site, unspecified stage: Secondary | ICD-10-CM | POA: Diagnosis not present

## 2015-03-26 DIAGNOSIS — K2980 Duodenitis without bleeding: Secondary | ICD-10-CM | POA: Diagnosis not present

## 2015-03-26 DIAGNOSIS — I10 Essential (primary) hypertension: Secondary | ICD-10-CM | POA: Diagnosis not present

## 2015-03-26 DIAGNOSIS — K219 Gastro-esophageal reflux disease without esophagitis: Secondary | ICD-10-CM | POA: Diagnosis not present

## 2015-03-26 DIAGNOSIS — I5032 Chronic diastolic (congestive) heart failure: Secondary | ICD-10-CM | POA: Diagnosis not present

## 2015-03-26 DIAGNOSIS — N183 Chronic kidney disease, stage 3 (moderate): Secondary | ICD-10-CM | POA: Diagnosis not present

## 2015-03-26 DIAGNOSIS — R1310 Dysphagia, unspecified: Secondary | ICD-10-CM | POA: Diagnosis not present

## 2015-03-26 DIAGNOSIS — D509 Iron deficiency anemia, unspecified: Secondary | ICD-10-CM | POA: Diagnosis not present

## 2015-03-26 DIAGNOSIS — D631 Anemia in chronic kidney disease: Secondary | ICD-10-CM | POA: Diagnosis not present

## 2015-03-26 DIAGNOSIS — K5901 Slow transit constipation: Secondary | ICD-10-CM | POA: Diagnosis not present

## 2015-03-26 DIAGNOSIS — M6281 Muscle weakness (generalized): Secondary | ICD-10-CM | POA: Diagnosis not present

## 2015-03-26 DIAGNOSIS — K2990 Gastroduodenitis, unspecified, without bleeding: Secondary | ICD-10-CM | POA: Diagnosis not present

## 2015-03-26 DIAGNOSIS — E876 Hypokalemia: Secondary | ICD-10-CM | POA: Diagnosis not present

## 2015-03-26 DIAGNOSIS — I129 Hypertensive chronic kidney disease with stage 1 through stage 4 chronic kidney disease, or unspecified chronic kidney disease: Secondary | ICD-10-CM | POA: Diagnosis not present

## 2015-03-26 DIAGNOSIS — F0390 Unspecified dementia without behavioral disturbance: Secondary | ICD-10-CM | POA: Diagnosis not present

## 2015-03-26 NOTE — Progress Notes (Signed)
PROGRESS NOTE  Sarah Perkins [id number] DOB: 11-20-1917 DOA: 03/22/2015 PCP: Thressa Sheller, MD  Brief history 81 year old female with history of diastolic CHF, hypertension, diverticular bleed, dementia, CKD stage III, constipation presented with 2-3 day history of progressive weakness and anorexia. At baseline, the patient has poor mobility and essentially sits in her chair most of the day. She is able to help in transfers, but commonly requires the assistance of her son who is her primary caretaker. However, the patient has had progressive weakness to the point where she has had difficulty standing. In addition, the patient has had decreasing oral intake. The patient's son states that she has barely been eating anything for nearly one week. As a result, the patient was brought to emergency department for further evaluation. The patient's son also noted some possible hematochezia. The patient did not have any hematochezia during the hospitalization. Her hemoglobin trended down with intravenous fluids, but there was no overt bleeding. With intravenous fluids, repletion of B12 the patient perked up and was much more alert, but she remained deconditioned. Physical therapy was consulted. They recommended skilled nursing facility. Unfortunately, the nursing facility at which the patient's family desired did not have a bed available. As a result, the patient's son decided to take the patient back home. Assessment/Plan: Duodenitis -03/22/2015 CT abdomen and pelvis shows basilar blebs with question of thickening of the duodenal bulb however the images were limited by the patient's movement. Otherwise, there was no acute intra-abdominal findings except for large amount of stool burden -Continue PPI bid x one month, then once daily -Avoid NSAIDs--the patient's son had been giving the patient NSAIDs at home for pain fairly frequently. Constipation -This is contributing to the patient's  abdominal pain -The patient will need to be discharged with daily bowel regimen -Continue MiraLAX -Add bisacodyl suppository-->+BM Dehydration -The patient had pre-renal azotemia noted at the time of admission which fits her clinical exam and history -Continue intravenous fluids -Serum creatinine 0.6 on day of discharge -Vital signs on morning of 03/26/2015 98.61F-90-18-144/54-95% room air Generalized weakness/FTT -Urinalysis is negative for polyuria -TSH 3.146 -nutrition evaluation appreciated-->Ensure Enlive added -Serum B12 256--start cyanocobalamin 1000 mcg IM x2 and start po B12--500 g after discharge History of GI bleed -Baseline hemoglobin 8-9 -Hemoglobin 8.8 at the time of admission -Drop in hemoglobin likely represents dilution secondary to intravenous fluids -09/22/2011 colonoscopy showed pandiverticulosis -Given the patient's age and comorbidities, would opt for conservative therapy and close observation -Patient was taking Advil PM at bedtime at home -Avoid NSAIDs  -Patient has had a bowel movement during this admission without any blood  -Patient received 1 unit of PRBC during admission. -Hemoglobin 9.1 on day of discharge--throughout the hospitalization, there is no evidence of active bleeding -The patient remained hemodynamically stable Chronic diastolic CHF -Compensated at this time -05/16/2011 echo shows EF 60-65% -Held home dose of furosemide during hospitalization -resume lasix after d/c Iron deficiency anemia  -Iron saturation 16%, ferritin 31  -Restart iron supplementation--restart ferrous sulfate- -Given one dose Nulecit -03/24/15--transfuse one unit PRBC Dementia -appreciate speech evaluation-->Dys 3 diet with thin liquids -PT evaluation--SNF Hypertension  -Continue amlodipine  -Blood pressure controlled/acceptable CKD stage 2-3 -Baseline creatinine 0.6-0.9 -Serum creatinine 0.69 on the day of discharge   Family Communication:   Pt at  beside Disposition Plan:   SNF when bed available      Procedures/Studies: Ct Abdomen Pelvis W Contrast  03/22/2015  CLINICAL DATA:  Generalized weakness, stomach  pain for 2 days, decreased appetite. EXAM: CT ABDOMEN AND PELVIS WITH CONTRAST TECHNIQUE: Multidetector CT imaging of the abdomen and pelvis was performed using the standard protocol following bolus administration of intravenous contrast. CONTRAST:  23mL OMNIPAQUE IOHEXOL 300 MG/ML  SOLN COMPARISON:  CT dated 02/14/2014. FINDINGS: Lower chest: No acute findings. Small emphysematous blebs noted at the bilateral lung bases. Mild chronic scarring/atelectasis also noted at each lung base. Moderate-sized hiatal hernia again noted without significant change. Hepatobiliary: Layering stones again seen within the nondistended gallbladder. No evidence of acute cholecystitis. No bile duct dilatation. Liver appears normal. Pancreas: Infiltrated with fat throughout, similar to the previous exam. Otherwise unremarkable. Spleen: Within normal limits in size and appearance. Adrenals/Urinary Tract: Small renal cysts bilaterally, unchanged. No acute findings. No renal stone or hydronephrosis. No ureteral or bladder calculi identified. Bladder is decompressed and partially obscured by metallic artifact from patient's right hip prosthesis. Adrenal glands are unremarkable. Stomach/Bowel: There is questionable thickening of the walls of the duodenal bulb, characterization limited by patient movement artifact. The stomach is nondistended. Remainder of the small bowel is normal in caliber and configuration. Fairly extensive diverticulosis noted throughout the colon but no focal inflammatory change to suggest acute diverticulitis. Appendix is normal. Vascular/Lymphatic: Atherosclerotic changes of the normal - caliber abdominal aorta and branch vessels. Prominent atherosclerotic calcifications seen at the left renal artery ostium. No acute vascular abnormality seen.  Reproductive: No mass or other significant abnormality. Presumed hysterectomy. Other: No free fluid or abscess collection identified. No free intraperitoneal air. Musculoskeletal: Degenerative changes are seen throughout the thoracolumbar spine and patient is status post right hip arthroplasty. Old compression fracture deformity of the L1 vertebral body is unchanged. No acute osseous abnormality seen. Periumbilical abdominal hernia again noted which contains fat only. IMPRESSION: 1. Questionable thickening of the walls in the duodenal bulb region which could be neoplastic, infectious or inflammatory in nature. Characterization of this area, however, is limited by patient motion artifact. Could consider repeat CT of the abdomen or endoscopic evaluation. 2. Extensive colonic diverticulosis without evidence of acute diverticulitis. 3. Cholelithiasis without evidence of acute cholecystitis. 4. Additional chronic/incidental findings detailed above. Electronically Signed   By: Franki Cabot M.D.   On: 03/22/2015 12:38        Subjective: Patient complains some nausea this morning without emesis. She denies a headache, discomfort, shortness breath, coughing, hemoptysis, vomiting, diarrhea, abdominal pain.   Objective: Filed Vitals:   03/25/15 0538 03/25/15 1412 03/25/15 2134 03/26/15 0536  BP: 155/57 142/53 145/54 144/65  Pulse: 90 90 86 92  Temp: 97.8 F (36.6 C) 98.7 F (37.1 C) 98.5 F (36.9 C) 97.4 F (36.3 C)  TempSrc: Oral  Oral Oral  Resp: 20 19 18 20   Height:      Weight:      SpO2: 95% 95% 95% 93%    Intake/Output Summary (Last 24 hours) at 03/26/15 [id number] Last data filed at 03/25/15 1838  Gross per 24 hour  Intake   1260 ml  Output      6 ml  Net   1254 ml   Weight change:  Exam:   General:  Pt is alert, follows commands appropriately, not in acute distress  HEENT: No icterus, No thrush, No neck mass, [place]/AT  Cardiovascular: RRR, S1/S2, no rubs, no gallops  Respiratory:  Fine bibasilar crackles, left greater than right. No wheezes   Abdomen: Soft/+BS, non tender, non distended, no guarding  Extremities: No edema, No lymphangitis, No petechiae, No rashes, no  synovitis  Data Reviewed: Basic Metabolic Panel:  Recent Labs Lab 03/22/15 1021 03/23/15 0603 03/24/15 0339 03/25/15 0602  NA 143 144 143 141  K 3.3* 3.9 3.6 3.6  CL 110 114* 115* 113*  CO2 27 25 25 24   GLUCOSE 102* 89 97 100*  BUN 31* 21* 21* 18  CREATININE 0.80 0.67 0.85 0.69  CALCIUM 9.1 8.4* 8.1* 8.3*   Liver Function Tests:  Recent Labs Lab 03/22/15 1021  AST 15  ALT 11*  ALKPHOS 37*  BILITOT 0.6  PROT 7.7  ALBUMIN 2.9*    Recent Labs Lab 03/22/15 1021  LIPASE 16   No results for input(s): AMMONIA in the last 168 hours. CBC:  Recent Labs Lab 03/22/15 1021 03/22/15 1844 03/23/15 0603 03/24/15 0339 03/25/15 0602  WBC 9.6 9.3 8.4 9.1 9.4  NEUTROABS 6.6  --   --   --   --   HGB 9.1* 8.8* 7.7* 7.4* 9.1*  HCT 27.6* 27.1* 24.1* 23.3* 28.7*  MCV 92.6 92.5 94.1 95.1 92.9  PLT 219 235 203 207 206   Cardiac Enzymes: No results for input(s): CKTOTAL, CKMB, CKMBINDEX, TROPONINI in the last 168 hours. BNP: Invalid input(s): POCBNP CBG:  Recent Labs Lab 03/22/15 2159  GLUCAP 113*    Recent Results (from the past 240 hour(s))  MRSA PCR Screening     Status: Abnormal   Collection Time: 03/22/15  7:54 PM  Result Value Ref Range Status   MRSA by PCR POSITIVE (A) NEGATIVE Final    Comment:        The GeneXpert MRSA Assay (FDA approved for NASAL specimens only), is one component of a comprehensive MRSA colonization surveillance program. It is not intended to diagnose MRSA infection nor to guide or monitor treatment for MRSA infections. RESULT CALLED TO, READ BACK BY AND VERIFIED WITH: K LE RN 2151 03/22/15 A BROWNING      Scheduled Meds: . sodium chloride   Intravenous Once  . amLODipine  5 mg Oral Daily  . antiseptic oral rinse  7 mL Mouth Rinse BID    . Chlorhexidine Gluconate Cloth  6 each Topical Q0600  . cholecalciferol  1,000 Units Oral Daily  . docusate sodium  100 mg Oral BID  . feeding supplement (ENSURE ENLIVE)  237 mL Oral BID BM  . ferrous sulfate  325 mg Oral Q breakfast  . multivitamin with minerals  1 tablet Oral Daily  . mupirocin ointment  1 application Nasal BID  . pantoprazole  40 mg Oral BID  . polyethylene glycol  17 g Oral BID  . vitamin B-12  500 mcg Oral Daily   Continuous Infusions: . sodium chloride 50 mL/hr at 03/24/15 1555     Jessiah Steinhart, DO  [facility] Pager [phone]  If 7PM-7AM, please contact night-coverage [email or web address] Password [email or web address] 03/26/2015, 9:39 AM   LOS: 4 days

## 2015-03-26 NOTE — NC FL2 (Signed)
[place] LEVEL OF CARE SCREENING TOOL     IDENTIFICATION  Patient Name: Sarah Perkins Birthdate: 14-May-1917 Sex: female Admission Date (Current Location): 03/22/2015  [facility] and [place] Number: Herbalist and Address:  [facility] [place], [place] [address], [place], [place] [address]      Provider Number: [id number]  Attending Physician Name and Address:  Orson Eva, MD  Relative Name and Phone Number:       Current Level of Care: Hospital Recommended Level of Care: [place] Prior Approval Number:    Date Approved/Denied:   PASRR Number: [id number]  Discharge Plan: SNF    Current Diagnoses: Patient Active Problem List   Diagnosis Date Noted  . Pressure ulcer 03/23/2015  . CKD (chronic kidney disease) stage 3, GFR 30-59 ml/min 03/23/2015  . Duodenitis without bleeding 03/22/2015  . Azotemia 03/22/2015  . Acute hypokalemia 03/22/2015  . Constipation   . Cough   . Palliative care encounter   . Anemia 10/08/2014  . Rectal bleeding 08/21/2014  . Chronic diastolic CHF (congestive heart failure) ([place]) 02/22/2014  . GIB (gastrointestinal bleeding) 02/14/2014  . Abnormal EKG 02/14/2014  . Dementia 01/28/2013  . Acute posthemorrhagic anemia 09/20/2011  . HTN (hypertension) 05/15/2011  . CKD (chronic kidney disease), stage I 05/15/2011  . GERD (gastroesophageal reflux disease) 05/15/2011  . Diverticulosis 05/15/2011    Orientation ACTIVITIES/SOCIAL BLADDER RESPIRATION    Self, Place  Family supportive Incontinent Normal  BEHAVIORAL SYMPTOMS/MOOD NEUROLOGICAL BOWEL NUTRITION STATUS      Incontinent  (See DC Summary)  PHYSICIAN VISITS COMMUNICATION OF NEEDS Height & Weight Skin    Verbally   136 lbs. PU Stage and Appropriate Care PU Stage 1 Dressing:  (See DC Summary)        AMBULATORY STATUS RESPIRATION    Assist extensive Normal      Personal Care Assistance Level of Assistance  Bathing,  Feeding, Dressing Bathing Assistance: Maximum assistance Feeding assistance: Maximum assistance Dressing Assistance: Maximum assistance      Functional Limitations Info                [place]  PT (By licensed PT)     PT Frequency: 5x/week             Additional Factors Info  Code Status, Allergies Code Status Info: DNR Allergies Info: NKA           Current Medications (03/26/2015): Current Facility-Administered Medications  Medication Dose Route Frequency Provider Last Rate Last Dose  . 0.9 %  sodium chloride infusion   Intravenous Continuous Orson Eva, MD 50 mL/hr at 03/24/15 1555    . 0.9 %  sodium chloride infusion   Intravenous Once Orson Eva, MD      . acetaminophen (TYLENOL) tablet 650 mg  650 mg Oral Q6H PRN Samella Parr, NP       Or  . acetaminophen (TYLENOL) suppository 650 mg  650 mg Rectal Q6H PRN Samella Parr, NP      . amLODipine (NORVASC) tablet 5 mg  5 mg Oral Daily Belkys A Regalado, MD   5 mg at 03/26/15 1007  . antiseptic oral rinse (CPC / CETYLPYRIDINIUM CHLORIDE 0.05%) solution 7 mL  7 mL Mouth Rinse BID Belkys A Regalado, MD   7 mL at 03/26/15 1012  . Chlorhexidine Gluconate Cloth 2 % PADS 6 each  6 each Topical Q0600 Belkys A Regalado, MD   6 each  at 03/25/15 0600  . cholecalciferol (VITAMIN D) tablet 1,000 Units  1,000 Units Oral Daily Samella Parr, NP   1,000 Units at 03/26/15 1007  . docusate sodium (COLACE) capsule 100 mg  100 mg Oral BID Samella Parr, NP   100 mg at 03/26/15 1007  . feeding supplement (ENSURE ENLIVE) (ENSURE ENLIVE) liquid 237 mL  237 mL Oral BID BM Jenifer A Williams, RD   237 mL at 03/26/15 1343  . ferrous sulfate tablet 325 mg  325 mg Oral Q breakfast Samella Parr, NP   325 mg at 03/26/15 [id number]  . multivitamin with minerals tablet 1 tablet  1 tablet Oral Daily Jenifer A Jimmye Norman, RD   1 tablet at 03/26/15 1007  . mupirocin ointment (BACTROBAN) 2 % 1 application  1 application Nasal BID  Belkys A Regalado, MD   1 application at [id number]  . ondansetron (ZOFRAN) tablet 4 mg  4 mg Oral Q6H PRN Samella Parr, NP       Or  . ondansetron [facility]) injection 4 mg  4 mg Intravenous Q6H PRN Samella Parr, NP      . pantoprazole (PROTONIX) EC tablet 40 mg  40 mg Oral BID Valeda Malm Rumbarger, RPH   40 mg at 03/26/15 1007  . polyethylene glycol (MIRALAX / GLYCOLAX) packet 17 g  17 g Oral BID Samella Parr, NP   17 g at 03/26/15 1007  . vitamin B-12 (CYANOCOBALAMIN) tablet 500 mcg  500 mcg Oral Daily Orson Eva, MD   500 mcg at 03/26/15 1006   Do not use this list as official medication orders. Please verify with discharge summary.  Discharge Medications:   Medication List    STOP taking these medications        ADVIL PM 200-25 MG Caps  Generic drug:  Ibuprofen-Diphenhydramine HCl      TAKE these medications        amLODipine 5 MG tablet  Commonly known as:  NORVASC  Take 5 mg by mouth daily.     Calcium-Magnesium-Vitamin D 185-50-100 MG-MG-UNIT Caps  Take 1 capsule by mouth daily.     Calcium-Magnesium-Zinc Tabs  Take 1 tablet by mouth daily.     cholecalciferol 1000 UNITS tablet  Commonly known as:  VITAMIN D  Take 1,000 Units by mouth daily.     cyanocobalamin 500 MCG tablet  Take 1 tablet (500 mcg total) by mouth daily.     docusate sodium 100 MG capsule  Commonly known as:  COLACE  Take 100 mg by mouth at bedtime.     feeding supplement (ENSURE ENLIVE) Liqd  Take 237 mLs by mouth 2 (two) times daily between meals.     ferrous sulfate 325 (65 FE) MG tablet  Take 325 mg by mouth daily with breakfast.     furosemide 20 MG tablet  Commonly known as:  LASIX  Take 10 mg by mouth daily.     guaifenesin 100 MG/5ML syrup  Commonly known as:  ROBITUSSIN  Take 200 mg by mouth 3 (three) times daily as needed for cough.     pantoprazole 40 MG tablet  Commonly known as:  PROTONIX  Take 1 tablet (40 mg total) by mouth 2 (two) times daily. X one month, then  1 tablet (40mg ) once daily     vitamin C 100 MG tablet  Take 500 mg by mouth daily. With rose hips 500 mg        Relevant Imaging  Results:  Relevant Lab Results:  Recent Labs    Additional Information Contact precautions: MRSA  Williemae Area, LCSW

## 2015-03-26 NOTE — Progress Notes (Signed)
03/26/15  Patient going to [facility] facility today, IV site removed, and discharge instructions reviewed with son.

## 2015-03-26 NOTE — Clinical Social Work Placement (Signed)
   CLINICAL SOCIAL WORK PLACEMENT  NOTE  Date:  03/26/2015  Patient Details  Name: Sarah Perkins MRN: [id number] Date of Birth: 08/30/1917  Clinical Social Work is seeking post-discharge placement for this patient at the [place] level of care (*CSW will initial, date and re-position this form in  chart as items are completed):  Yes   Patient/family provided with [facility] Work Department's list of facilities offering this level of care within the geographic area requested by the patient (or if unable, by the patient's family).  Yes   Patient/family informed of their freedom to choose among providers that offer the needed level of care, that participate in Medicare, Medicaid or managed care program needed by the patient, have an available bed and are willing to accept the patient.  Yes   Patient/family informed of [facility]'s ownership interest in [facility] and [facility], as well as of the fact that they are under no obligation to receive care at these facilities.  PASRR submitted to EDS on 03/25/15     PASRR number received on 03/25/15     Existing PASRR number confirmed on       FL2 transmitted to all facilities in geographic area requested by pt/family on 03/25/15     FL2 transmitted to all facilities within larger geographic area on 03/25/15     Patient informed that his/her managed care company has contracts with or will negotiate with certain facilities, including the following:        Yes   Patient/family informed of bed offers received.  Patient chooses bed at [facility]     Physician recommends and patient chooses bed at      Patient to be transferred to [facility] on  .03/26/2015  Patient to be transferred to facility by Ambulance [facility])     Patient family notified on 03/26/15 of transfer.  Name of family member notified:  [name]- Son     PHYSICIAN Please  prepare priority discharge summary, including medications, Please sign FL2, Please sign DNR     Additional Comment: Ok per MD for d/c today to SNF for continued care. Nursing notified to call report.  DC summary sent to facility.  Son is aware of d/c as is facility Liaison. No further CSW needs identified. CSW signing off.  [name]. Murrell Redden [id number]      _______________________________________________ Williemae Area, LCSW 03/26/2015, 4:22 PM

## 2015-03-27 ENCOUNTER — Non-Acute Institutional Stay (SKILLED_NURSING_FACILITY): Payer: Medicare Other | Admitting: Internal Medicine

## 2015-03-27 DIAGNOSIS — R627 Adult failure to thrive: Secondary | ICD-10-CM

## 2015-03-27 DIAGNOSIS — K5791 Diverticulosis of intestine, part unspecified, without perforation or abscess with bleeding: Secondary | ICD-10-CM

## 2015-03-27 DIAGNOSIS — N182 Chronic kidney disease, stage 2 (mild): Secondary | ICD-10-CM

## 2015-03-27 DIAGNOSIS — K5901 Slow transit constipation: Secondary | ICD-10-CM

## 2015-03-27 DIAGNOSIS — D509 Iron deficiency anemia, unspecified: Secondary | ICD-10-CM

## 2015-03-27 DIAGNOSIS — F0390 Unspecified dementia without behavioral disturbance: Secondary | ICD-10-CM | POA: Diagnosis not present

## 2015-03-27 DIAGNOSIS — E860 Dehydration: Secondary | ICD-10-CM

## 2015-03-27 DIAGNOSIS — I10 Essential (primary) hypertension: Secondary | ICD-10-CM | POA: Diagnosis not present

## 2015-03-27 DIAGNOSIS — I5032 Chronic diastolic (congestive) heart failure: Secondary | ICD-10-CM

## 2015-03-27 DIAGNOSIS — K2980 Duodenitis without bleeding: Secondary | ICD-10-CM | POA: Diagnosis not present

## 2015-03-28 ENCOUNTER — Encounter: Payer: Self-pay | Admitting: Internal Medicine

## 2015-03-28 NOTE — Progress Notes (Signed)
MRN: [id number] Name: HAZELINE BACHRACH  Sex: female Age: 81 y.o. DOB: [birth year]  [place] #:[place]  Facility/Room: Level Of Care: SNF Provider: Inocencio Homes D Emergency Contacts: Extended Emergency Contact Information Primary Emergency Contact: Haro,Charles Address: [address]          [place], [place] [address] Johnnette Litter of [place] Phone: [phone] Mobile Phone: [phone] Relation: Son Secondary Emergency Contact: Mallie Darting, [place] Relation: Sister  Code Status:   Allergies: Review of patient's allergies indicates no known allergies.  Chief Complaint  Patient presents with  . New Admit To SNF    HPI: Patient is 81 y.o. female with diastolic CHF, hypertension, diverticular bleed, dementia, CKD stage III, constipation presented with 2-3 day history of progressive weakness and anorexia. At baseline, the patient has poor mobility and essentially sits in her chair most of the day. She is able to help in transfers, but commonly requires the assistance of her son who is her primary caretaker. However, the patient has had progressive weakness to the point where she has had difficulty standing. In addition, the patient has had decreasing oral intake. The patient's son states that she has barely been eating anything for nearly one week. As a result, the patient was brought to emergency department for further evaluation. Pt was admitted to [facility] from 11/23-27 where she was txed with IVF and B12 and no active source of bleeding found. Pt is admiited to SNF for generalized weakness.While at SNF pt will be followed for HTN, tx with norvasc, anemia, tx with iron and CHF, tx with lasix.  Past Medical History  Diagnosis Date  . CHF (congestive heart failure) ([place])   . Hypertension   . Aneurysm ([place]) 1972    cerebral  . Acid reflux     occasional  . Tremor     worse on right arm  . History of GI diverticular bleed 06/2009   colonoscopy with endo clipping of bleeding tic by Dr Clarene Essex.  Flex sig by Dr Paulita Fujita.  . Acute blood loss anemia 06/2009    received ~ 6 units blood  . Adenomatous polyp of colon 06/2009  . Diverticulitis   . UTI (lower urinary tract infection)   . GI bleed 02/14/2014    Past Surgical History  Procedure Laterality Date  . Hip fracture surgery  2011  . Colonoscopy w/ control of hemorrhage  06/2009  . Knee arthroscopy    . Cerebral aneurysm repair  1970's  . Colonoscopy  09/22/2011    Procedure: COLONOSCOPY;  Surgeon: Juanita Craver, MD;  Location: [facility] ENDOSCOPY;  Service: Endoscopy;  Laterality: N/A;  wants ped scope      Medication List       This list is accurate as of: 03/27/15 11:59 PM.  Always use your most recent med list.               amLODipine 5 MG tablet  Commonly known as:  NORVASC  Take 5 mg by mouth daily.     Calcium-Magnesium-Vitamin D 185-50-100 MG-MG-UNIT Caps  Take 1 capsule by mouth daily.     Calcium-Magnesium-Zinc Tabs  Take 1 tablet by mouth daily.     cholecalciferol 1000 UNITS tablet  Commonly known as:  VITAMIN D  Take 1,000 Units by mouth daily.     cyanocobalamin 500 MCG tablet  Take 1 tablet (500 mcg total) by mouth daily.     docusate  sodium 100 MG capsule  Commonly known as:  COLACE  Take 100 mg by mouth at bedtime.     feeding supplement (ENSURE ENLIVE) Liqd  Take 237 mLs by mouth 2 (two) times daily between meals.     ferrous sulfate 325 (65 FE) MG tablet  Take 325 mg by mouth daily with breakfast.     furosemide 20 MG tablet  Commonly known as:  LASIX  Take 10 mg by mouth daily.     guaifenesin 100 MG/5ML syrup  Commonly known as:  ROBITUSSIN  Take 200 mg by mouth 3 (three) times daily as needed for cough.     pantoprazole 40 MG tablet  Commonly known as:  PROTONIX  Take 1 tablet (40 mg total) by mouth 2 (two) times daily. X one month, then 1 tablet (40mg ) once daily     vitamin C 100 MG tablet  Take 500 mg by mouth  daily. With rose hips 500 mg        No orders of the defined types were placed in this encounter.    Immunization History  Administered Date(s) Administered  . Influenza,inj,Quad PF,36+ Mos 01/29/2013, 02/15/2014    Social History  Substance Use Topics  . Smoking status: Never Smoker   . Smokeless tobacco: Never Used  . Alcohol Use: No    Family history is UTO 2/2 pt dementia    Review of Systems - UTO 2/2 pt dementia   Filed Vitals:   03/28/15 2244  BP: 144/66  Pulse: 94  Temp: 97.8 F (36.6 C)  Resp: 20    SpO2 Readings from Last 1 Encounters:  03/26/15 93%        Physical Exam  GENERAL APPEARANCE: Alert, min  conversant,  No acute distress.  SKIN: No diaphoresis rash HEAD: Normocephalic, atraumatic  EYES: Conjunctiva/lids clear. Pupils round, reactive. EOMs intact.  EARS: External exam WNL, canals clear. Hearing grossly normal.  NOSE: No deformity or discharge.  MOUTH/THROAT: Lips w/o lesions  RESPIRATORY: Breathing is even, unlabored. Lung sounds are clear   CARDIOVASCULAR: Heart RRR no murmurs, rubs or gallops. No peripheral edema.   GASTROINTESTINAL: Abdomen is soft, non-tender, not distended w/ normal bowel sounds. GENITOURINARY: Bladder non tender, not distended  MUSCULOSKELETAL: No abnormal joints or musculature NEUROLOGIC:  Cranial nerves 2-12 grossly intact. Moves all extremities  PSYCHIATRIC: dementia, oriented to self only, no behavioral issues  Patient Active Problem List   Diagnosis Date Noted  . Dehydration 03/29/2015  . FTT (failure to thrive) in adult 03/29/2015  . Pressure ulcer 03/23/2015  . CKD (chronic kidney disease) stage 2, GFR 60-89 ml/min 03/23/2015  . Duodenitis without bleeding 03/22/2015  . Azotemia 03/22/2015  . Acute hypokalemia 03/22/2015  . Constipation   . Cough   . Palliative care encounter   . Anemia, iron deficiency 10/08/2014  . Rectal bleeding 08/21/2014  . Chronic diastolic CHF (congestive heart failure)  ([place]) 02/22/2014  . GIB (gastrointestinal bleeding) 02/14/2014  . Abnormal EKG 02/14/2014  . Dementia 01/28/2013  . Acute posthemorrhagic anemia 09/20/2011  . HTN (hypertension) 05/15/2011  . CKD (chronic kidney disease), stage I 05/15/2011  . GERD (gastroesophageal reflux disease) 05/15/2011  . Diverticulosis 05/15/2011    CBC    Component Value Date/Time   WBC 9.4 03/25/2015 0602   WBC 8.5 06/01/2009 1500   RBC 3.09* 03/25/2015 0602   RBC 2.90* 03/22/2015 1844   RBC 3.93 06/01/2009 1500   HGB 9.1* 03/25/2015 0602   HGB 11.8 06/01/2009 1500  HCT 28.7* 03/25/2015 0602   HCT 36.4 06/01/2009 1500   PLT 206 03/25/2015 0602   PLT 328 06/01/2009 1500   MCV 92.9 03/25/2015 0602   MCV 92.6 06/01/2009 1500   LYMPHSABS 2.1 03/22/2015 1021   LYMPHSABS 2.5 06/01/2009 1500   MONOABS 0.8 03/22/2015 1021   MONOABS 0.8 06/01/2009 1500   EOSABS 0.1 03/22/2015 1021   EOSABS 0.1 06/01/2009 1500   BASOSABS 0.0 03/22/2015 1021   BASOSABS 0.1 06/01/2009 1500    CMP     Component Value Date/Time   NA 141 03/25/2015 0602   K 3.6 03/25/2015 0602   CL 113* 03/25/2015 0602   CO2 24 03/25/2015 0602   GLUCOSE 100* 03/25/2015 0602   BUN 18 03/25/2015 0602   CREATININE 0.69 03/25/2015 0602   CREATININE 0.73 01/26/2009 1310   CALCIUM 8.3* 03/25/2015 0602   PROT 7.7 03/22/2015 1021   ALBUMIN 2.9* 03/22/2015 1021   AST 15 03/22/2015 1021   ALT 11* 03/22/2015 1021   ALKPHOS 37* 03/22/2015 1021   BILITOT 0.6 03/22/2015 1021   GFRNONAA >60 03/25/2015 0602   GFRAA >60 03/25/2015 0602    No results found for: HGBA1C   Ct Abdomen Pelvis W Contrast  03/22/2015  CLINICAL DATA:  Generalized weakness, stomach pain for 2 days, decreased appetite. EXAM: CT ABDOMEN AND PELVIS WITH CONTRAST TECHNIQUE: Multidetector CT imaging of the abdomen and pelvis was performed using the standard protocol following bolus administration of intravenous contrast. CONTRAST:  24mL OMNIPAQUE IOHEXOL 300 MG/ML  SOLN  COMPARISON:  CT dated 02/14/2014. FINDINGS: Lower chest: No acute findings. Small emphysematous blebs noted at the bilateral lung bases. Mild chronic scarring/atelectasis also noted at each lung base. Moderate-sized hiatal hernia again noted without significant change. Hepatobiliary: Layering stones again seen within the nondistended gallbladder. No evidence of acute cholecystitis. No bile duct dilatation. Liver appears normal. Pancreas: Infiltrated with fat throughout, similar to the previous exam. Otherwise unremarkable. Spleen: Within normal limits in size and appearance. Adrenals/Urinary Tract: Small renal cysts bilaterally, unchanged. No acute findings. No renal stone or hydronephrosis. No ureteral or bladder calculi identified. Bladder is decompressed and partially obscured by metallic artifact from patient's right hip prosthesis. Adrenal glands are unremarkable. Stomach/Bowel: There is questionable thickening of the walls of the duodenal bulb, characterization limited by patient movement artifact. The stomach is nondistended. Remainder of the small bowel is normal in caliber and configuration. Fairly extensive diverticulosis noted throughout the colon but no focal inflammatory change to suggest acute diverticulitis. Appendix is normal. Vascular/Lymphatic: Atherosclerotic changes of the normal - caliber abdominal aorta and branch vessels. Prominent atherosclerotic calcifications seen at the left renal artery ostium. No acute vascular abnormality seen. Reproductive: No mass or other significant abnormality. Presumed hysterectomy. Other: No free fluid or abscess collection identified. No free intraperitoneal air. Musculoskeletal: Degenerative changes are seen throughout the thoracolumbar spine and patient is status post right hip arthroplasty. Old compression fracture deformity of the L1 vertebral body is unchanged. No acute osseous abnormality seen. Periumbilical abdominal hernia again noted which contains fat  only. IMPRESSION: 1. Questionable thickening of the walls in the duodenal bulb region which could be neoplastic, infectious or inflammatory in nature. Characterization of this area, however, is limited by patient motion artifact. Could consider repeat CT of the abdomen or endoscopic evaluation. 2. Extensive colonic diverticulosis without evidence of acute diverticulitis. 3. Cholelithiasis without evidence of acute cholecystitis. 4. Additional chronic/incidental findings detailed above. Electronically Signed   By: Franki Cabot M.D.   On:  03/22/2015 12:38    Not all labs, radiology exams or other studies done during hospitalization come through on my EPIC note; however they are reviewed by me.    Assessment and Plan  Duodenitis without bleeding 03/22/2015 CT abdomen and pelvis shows basilar blebs with question of thickening of the duodenal bulb however the images were limited by the patient's movement. Otherwise, there was no acute intra-abdominal findings except for large amount of stool burden -Avoid NSAIDs--the patient's son had been giving the patient NSAIDs at home for pain fairly frequently. SNF - PPI BID for one month then daily  Constipation This is contributing to the patient's abdominal pain -The patient will need to be discharged with daily bowel regimen SNF - Continue MiraLAX; Add bisacodyl suppository-->+BM  Dehydration The patient had pre-renal azotemia noted at the time of admission which fits her clinical exam and history -Continue intravenous fluids SNF - Serum creatinine 0.6 on day of discharge; will recheck BMP  FTT (failure to thrive) in adult -Urinalysis is negative for polyuria -TSH 3.146 -nutrition evaluation appreciated-->Ensure Enlive added -Serum B12 256-- SNF - start cyanocobalamin 1000 mcg IM x2 and start po B12 after discharge  GIB (gastrointestinal bleeding) -Baseline hemoglobin 8-9 -Hemoglobin 8.8 at the time of admission -Drop in hemoglobin likely  represents dilution secondary to intravenous fluids -09/22/2011 colonoscopy showed pandiverticulosis -Given the patient's age and comorbidities, would opt for conservative therapy and close observation -Patient was taking Advil PM at bedtime at home -Avoid NSAIDs  -Patient has had a bowel movement during this admission without any blood  -Patient received 1 unit of PRBC during admission. -Hemoglobin 9.1 on day of discharge SNF - will check CBC in 5-7 days  Chronic diastolic CHF (congestive heart failure) ([place]) Compensated at this time -05/16/2011 echo shows EF 60-65% -Held home dose of furosemide during hospitalization SNF - resume lasix   HTN (hypertension) SNF - not reported as uncontrolled; plan - cont norvasc 5 mg daily  Anemia, iron deficiency Iron saturation 16%, ferritin 31   -Given one dose Nulecit -03/24/15--transfuse one unit PRBC SNF - start FeSO4 325 mg daily  CKD (chronic kidney disease) stage 2, GFR 60-89 ml/min SNF - baseline cr 0.6-0.8; will monitor with BMP  Dementia SNF - with FTT; supportive care, dys 3 , thin liquids diet   Time spent 45 min;> 50% of time with patient was spent reviewing records, labs, tests and studies, counseling and developing plan of care  Hennie Duos, MD

## 2015-03-29 DIAGNOSIS — E860 Dehydration: Secondary | ICD-10-CM | POA: Insufficient documentation

## 2015-03-29 DIAGNOSIS — R627 Adult failure to thrive: Secondary | ICD-10-CM | POA: Insufficient documentation

## 2015-03-29 NOTE — Assessment & Plan Note (Signed)
-  Urinalysis is negative for polyuria -TSH 3.146 -nutrition evaluation appreciated-->Ensure Enlive added -Serum B12 256-- SNF - start cyanocobalamin 1000 mcg IM x2 and start po B12 after discharge

## 2015-03-29 NOTE — Assessment & Plan Note (Signed)
SNF - with FTT; supportive care, dys 3 , thin liquids diet

## 2015-03-29 NOTE — Assessment & Plan Note (Signed)
SNF - not reported as uncontrolled; plan - cont norvasc 5 mg daily

## 2015-03-29 NOTE — Assessment & Plan Note (Signed)
SNF - baseline cr 0.6-0.8; will monitor with BMP

## 2015-03-29 NOTE — Assessment & Plan Note (Signed)
This is contributing to the patient's abdominal pain -The patient will need to be discharged with daily bowel regimen SNF - Continue MiraLAX; Add bisacodyl suppository-->+BM

## 2015-03-29 NOTE — Assessment & Plan Note (Signed)
-  Baseline hemoglobin 8-9 -Hemoglobin 8.8 at the time of admission -Drop in hemoglobin likely represents dilution secondary to intravenous fluids -09/22/2011 colonoscopy showed pandiverticulosis -Given the patient's age and comorbidities, would opt for conservative therapy and close observation -Patient was taking Advil PM at bedtime at home -Avoid NSAIDs  -Patient has had a bowel movement during this admission without any blood  -Patient received 1 unit of PRBC during admission. -Hemoglobin 9.1 on day of discharge SNF - will check CBC in 5-7 days

## 2015-03-29 NOTE — Assessment & Plan Note (Signed)
Iron saturation 16%, ferritin 31   -Given one dose Nulecit -03/24/15--transfuse one unit PRBC SNF - start FeSO4 325 mg daily

## 2015-03-29 NOTE — Assessment & Plan Note (Signed)
The patient had pre-renal azotemia noted at the time of admission which fits her clinical exam and history -Continue intravenous fluids SNF - Serum creatinine 0.6 on day of discharge; will recheck BMP

## 2015-03-29 NOTE — Assessment & Plan Note (Signed)
Compensated at this time -05/16/2011 echo shows EF 60-65% -Held home dose of furosemide during hospitalization SNF - resume lasix

## 2015-03-29 NOTE — Assessment & Plan Note (Signed)
03/22/2015 CT abdomen and pelvis shows basilar blebs with question of thickening of the duodenal bulb however the images were limited by the patient's movement. Otherwise, there was no acute intra-abdominal findings except for large amount of stool burden -Avoid NSAIDs--the patient's son had been giving the patient NSAIDs at home for pain fairly frequently. SNF - PPI BID for one month then daily

## 2015-03-30 ENCOUNTER — Non-Acute Institutional Stay (SKILLED_NURSING_FACILITY): Payer: Medicare Other | Admitting: Internal Medicine

## 2015-03-30 DIAGNOSIS — R05 Cough: Secondary | ICD-10-CM | POA: Diagnosis not present

## 2015-03-30 DIAGNOSIS — K5791 Diverticulosis of intestine, part unspecified, without perforation or abscess with bleeding: Secondary | ICD-10-CM

## 2015-03-30 DIAGNOSIS — I5032 Chronic diastolic (congestive) heart failure: Secondary | ICD-10-CM

## 2015-03-30 DIAGNOSIS — M6281 Muscle weakness (generalized): Secondary | ICD-10-CM | POA: Diagnosis not present

## 2015-03-30 DIAGNOSIS — K2990 Gastroduodenitis, unspecified, without bleeding: Secondary | ICD-10-CM | POA: Diagnosis not present

## 2015-03-30 DIAGNOSIS — L8990 Pressure ulcer of unspecified site, unspecified stage: Secondary | ICD-10-CM | POA: Diagnosis not present

## 2015-03-30 DIAGNOSIS — D509 Iron deficiency anemia, unspecified: Secondary | ICD-10-CM | POA: Diagnosis not present

## 2015-03-30 DIAGNOSIS — I509 Heart failure, unspecified: Secondary | ICD-10-CM | POA: Diagnosis not present

## 2015-03-30 DIAGNOSIS — E860 Dehydration: Secondary | ICD-10-CM

## 2015-03-30 DIAGNOSIS — R1310 Dysphagia, unspecified: Secondary | ICD-10-CM | POA: Diagnosis not present

## 2015-03-30 DIAGNOSIS — I110 Hypertensive heart disease with heart failure: Secondary | ICD-10-CM

## 2015-03-30 DIAGNOSIS — N390 Urinary tract infection, site not specified: Secondary | ICD-10-CM | POA: Diagnosis not present

## 2015-03-30 DIAGNOSIS — K922 Gastrointestinal hemorrhage, unspecified: Secondary | ICD-10-CM | POA: Diagnosis not present

## 2015-03-30 DIAGNOSIS — R627 Adult failure to thrive: Secondary | ICD-10-CM | POA: Diagnosis not present

## 2015-03-30 DIAGNOSIS — I10 Essential (primary) hypertension: Secondary | ICD-10-CM | POA: Diagnosis not present

## 2015-03-30 DIAGNOSIS — N182 Chronic kidney disease, stage 2 (mild): Secondary | ICD-10-CM | POA: Diagnosis not present

## 2015-03-30 DIAGNOSIS — D649 Anemia, unspecified: Secondary | ICD-10-CM | POA: Diagnosis not present

## 2015-03-30 DIAGNOSIS — E569 Vitamin deficiency, unspecified: Secondary | ICD-10-CM | POA: Diagnosis not present

## 2015-03-30 DIAGNOSIS — F0390 Unspecified dementia without behavioral disturbance: Secondary | ICD-10-CM | POA: Diagnosis not present

## 2015-03-30 DIAGNOSIS — R1311 Dysphagia, oral phase: Secondary | ICD-10-CM | POA: Diagnosis not present

## 2015-03-30 DIAGNOSIS — K5900 Constipation, unspecified: Secondary | ICD-10-CM | POA: Diagnosis not present

## 2015-03-30 DIAGNOSIS — R251 Tremor, unspecified: Secondary | ICD-10-CM | POA: Diagnosis not present

## 2015-03-30 DIAGNOSIS — N183 Chronic kidney disease, stage 3 (moderate): Secondary | ICD-10-CM | POA: Diagnosis not present

## 2015-03-30 DIAGNOSIS — R5381 Other malaise: Secondary | ICD-10-CM | POA: Diagnosis not present

## 2015-03-30 DIAGNOSIS — K5793 Diverticulitis of intestine, part unspecified, without perforation or abscess with bleeding: Secondary | ICD-10-CM | POA: Diagnosis not present

## 2015-03-30 DIAGNOSIS — K219 Gastro-esophageal reflux disease without esophagitis: Secondary | ICD-10-CM | POA: Diagnosis not present

## 2015-03-30 DIAGNOSIS — K2980 Duodenitis without bleeding: Secondary | ICD-10-CM | POA: Diagnosis not present

## 2015-03-30 DIAGNOSIS — I671 Cerebral aneurysm, nonruptured: Secondary | ICD-10-CM | POA: Diagnosis not present

## 2015-03-30 DIAGNOSIS — N189 Chronic kidney disease, unspecified: Secondary | ICD-10-CM | POA: Diagnosis not present

## 2015-03-30 NOTE — Progress Notes (Signed)
MRN: [id number] Name: Sarah Perkins  Sex: female Age: 81 y.o. DOB: [birth year]  [place] #: [facility] Facility/Room: Level Of Care: SNF Provider: Inocencio Homes D Emergency Contacts: Extended Emergency Contact Information Primary Emergency Contact: Kristensen,Charles Address: [address]          [place], [place] [address] Johnnette Litter of [place] Phone: [phone] Mobile Phone: [phone] Relation: Son Secondary Emergency Contact: Mallie Darting, [place] Relation: Sister  Code Status:   Allergies: Review of patient's allergies indicates no known allergies.  Chief Complaint  Patient presents with  . Discharge Note    HPI: Patient is 81 y.o. female whodiastolic CHF, hypertension, diverticular bleed, dementia, CKD stage III, constipation presented with 2-3 day history of progressive weakness and anorexia. At baseline, the patient has poor mobility and essentially sits in her chair most of the day. She is able to help in transfers, but commonly requires the assistance of her son who is her primary caretaker. However, the patient has had progressive weakness to the point where she has had difficulty standing. In addition, the patient has had decreasing oral intake. The patient's son states that she has barely been eating anything for nearly one week. As a result, the patient was brought to emergency department for further evaluation. Pt was admitted to [facility] from 11/23-27 where she was txed with IVF and B12 and no active source of bleeding found. Pt was admiited to SNF for generalized weakness. Pt is now being d/c to another SNF.  Past Medical History  Diagnosis Date  . CHF (congestive heart failure) ([place])   . Hypertension   . Aneurysm ([place]) 1972    cerebral  . Acid reflux     occasional  . Tremor     worse on right arm  . History of GI diverticular bleed 06/2009    colonoscopy with endo clipping of bleeding tic by Dr Clarene Essex.  Flex  sig by Dr Paulita Fujita.  . Acute blood loss anemia 06/2009    received ~ 6 units blood  . Adenomatous polyp of colon 06/2009  . Diverticulitis   . UTI (lower urinary tract infection)   . GI bleed 02/14/2014    Past Surgical History  Procedure Laterality Date  . Hip fracture surgery  2011  . Colonoscopy w/ control of hemorrhage  06/2009  . Knee arthroscopy    . Cerebral aneurysm repair  1970's  . Colonoscopy  09/22/2011    Procedure: COLONOSCOPY;  Surgeon: Juanita Craver, MD;  Location: [facility] ENDOSCOPY;  Service: Endoscopy;  Laterality: N/A;  wants ped scope      Medication List       This list is accurate as of: 03/30/15 11:59 PM.  Always use your most recent med list.               amLODipine 5 MG tablet  Commonly known as:  NORVASC  Take 5 mg by mouth daily.     Calcium-Magnesium-Vitamin D 185-50-100 MG-MG-UNIT Caps  Take 1 capsule by mouth daily.     Calcium-Magnesium-Zinc Tabs  Take 1 tablet by mouth daily.     cholecalciferol 1000 UNITS tablet  Commonly known as:  VITAMIN D  Take 1,000 Units by mouth daily.     cyanocobalamin 500 MCG tablet  Take 1 tablet (500 mcg total) by mouth daily.     docusate sodium 100 MG capsule  Commonly known as:  COLACE  Take 100  mg by mouth at bedtime.     feeding supplement (ENSURE ENLIVE) Liqd  Take 237 mLs by mouth 2 (two) times daily between meals.     ferrous sulfate 325 (65 FE) MG tablet  Take 325 mg by mouth daily with breakfast.     furosemide 20 MG tablet  Commonly known as:  LASIX  Take 10 mg by mouth daily.     guaifenesin 100 MG/5ML syrup  Commonly known as:  ROBITUSSIN  Take 200 mg by mouth 3 (three) times daily as needed for cough.     pantoprazole 40 MG tablet  Commonly known as:  PROTONIX  Take 1 tablet (40 mg total) by mouth 2 (two) times daily. X one month, then 1 tablet (40mg ) once daily     vitamin C 100 MG tablet  Take 500 mg by mouth daily. With rose hips 500 mg        No orders of the defined types were  placed in this encounter.    Immunization History  Administered Date(s) Administered  . Influenza,inj,Quad PF,36+ Mos 01/29/2013, 02/15/2014    Social History  Substance Use Topics  . Smoking status: Never Smoker   . Smokeless tobacco: Never Used  . Alcohol Use: No    Filed Vitals:   04/01/15 1138  BP: 130/70  Pulse: 92  Temp: 98.2 F (36.8 C)  Resp: 18    Physical Exam  GENERAL APPEARANCE: Alert, conversant. No acute distress.  HEENT: Unremarkable. RESPIRATORY: Breathing is even, unlabored. Lung sounds are clear   CARDIOVASCULAR: Heart RRR no murmurs, rubs or gallops. No peripheral edema.  GASTROINTESTINAL: Abdomen is soft, non-tender, not distended w/ normal bowel sounds.  NEUROLOGIC: Cranial nerves 2-12 grossly intact. Moves all extremities  Patient Active Problem List   Diagnosis Date Noted  . Dehydration 03/29/2015  . FTT (failure to thrive) in adult 03/29/2015  . Pressure ulcer 03/23/2015  . CKD (chronic kidney disease) stage 2, GFR 60-89 ml/min 03/23/2015  . Duodenitis without bleeding 03/22/2015  . Azotemia 03/22/2015  . Acute hypokalemia 03/22/2015  . Constipation   . Cough   . Palliative care encounter   . Anemia, iron deficiency 10/08/2014  . Rectal bleeding 08/21/2014  . Chronic diastolic CHF (congestive heart failure) ([place]) 02/22/2014  . GIB (gastrointestinal bleeding) 02/14/2014  . Abnormal EKG 02/14/2014  . Dementia 01/28/2013  . Acute posthemorrhagic anemia 09/20/2011  . Hypertensive heart disease with CHF (congestive heart failure) ([place]) 05/15/2011  . CKD (chronic kidney disease), stage I 05/15/2011  . GERD (gastroesophageal reflux disease) 05/15/2011  . Diverticulosis 05/15/2011    CBC    Component Value Date/Time   WBC 9.4 03/25/2015 0602   WBC 8.5 06/01/2009 1500   RBC 3.09* 03/25/2015 0602   RBC 2.90* 03/22/2015 1844   RBC 3.93 06/01/2009 1500   HGB 9.1* 03/25/2015 0602   HGB 11.8 06/01/2009 1500   HCT 28.7* 03/25/2015 0602    HCT 36.4 06/01/2009 1500   PLT 206 03/25/2015 0602   PLT 328 06/01/2009 1500   MCV 92.9 03/25/2015 0602   MCV 92.6 06/01/2009 1500   LYMPHSABS 2.1 03/22/2015 1021   LYMPHSABS 2.5 06/01/2009 1500   MONOABS 0.8 03/22/2015 1021   MONOABS 0.8 06/01/2009 1500   EOSABS 0.1 03/22/2015 1021   EOSABS 0.1 06/01/2009 1500   BASOSABS 0.0 03/22/2015 1021   BASOSABS 0.1 06/01/2009 1500    CMP     Component Value Date/Time   NA 141 03/25/2015 0602   K 3.6 03/25/2015  0602   CL 113* 03/25/2015 0602   CO2 24 03/25/2015 0602   GLUCOSE 100* 03/25/2015 0602   BUN 18 03/25/2015 0602   CREATININE 0.69 03/25/2015 0602   CREATININE 0.73 01/26/2009 1310   CALCIUM 8.3* 03/25/2015 0602   PROT 7.7 03/22/2015 1021   ALBUMIN 2.9* 03/22/2015 1021   AST 15 03/22/2015 1021   ALT 11* 03/22/2015 1021   ALKPHOS 37* 03/22/2015 1021   BILITOT 0.6 03/22/2015 1021   GFRNONAA >60 03/25/2015 0602   GFRAA >60 03/25/2015 0602    Assessment and Plan  Pt is being d/c to [facility] SNF in stable condition.  Time spent >30 min Hennie Duos, MD

## 2015-04-01 ENCOUNTER — Encounter: Payer: Self-pay | Admitting: Internal Medicine

## 2015-04-30 DIAGNOSIS — R251 Tremor, unspecified: Secondary | ICD-10-CM | POA: Diagnosis not present

## 2015-04-30 DIAGNOSIS — K219 Gastro-esophageal reflux disease without esophagitis: Secondary | ICD-10-CM | POA: Diagnosis not present

## 2015-04-30 DIAGNOSIS — K2990 Gastroduodenitis, unspecified, without bleeding: Secondary | ICD-10-CM | POA: Diagnosis not present

## 2015-04-30 DIAGNOSIS — K5793 Diverticulitis of intestine, part unspecified, without perforation or abscess with bleeding: Secondary | ICD-10-CM | POA: Diagnosis not present

## 2015-04-30 DIAGNOSIS — K922 Gastrointestinal hemorrhage, unspecified: Secondary | ICD-10-CM | POA: Diagnosis not present

## 2015-04-30 DIAGNOSIS — F0390 Unspecified dementia without behavioral disturbance: Secondary | ICD-10-CM | POA: Diagnosis not present

## 2015-04-30 DIAGNOSIS — M6281 Muscle weakness (generalized): Secondary | ICD-10-CM | POA: Diagnosis not present

## 2015-04-30 DIAGNOSIS — N390 Urinary tract infection, site not specified: Secondary | ICD-10-CM | POA: Diagnosis not present

## 2015-04-30 DIAGNOSIS — N189 Chronic kidney disease, unspecified: Secondary | ICD-10-CM | POA: Diagnosis not present

## 2015-04-30 DIAGNOSIS — R1311 Dysphagia, oral phase: Secondary | ICD-10-CM | POA: Diagnosis not present

## 2015-04-30 DIAGNOSIS — I5032 Chronic diastolic (congestive) heart failure: Secondary | ICD-10-CM | POA: Diagnosis not present

## 2015-04-30 DIAGNOSIS — E569 Vitamin deficiency, unspecified: Secondary | ICD-10-CM | POA: Diagnosis not present

## 2015-04-30 DIAGNOSIS — I10 Essential (primary) hypertension: Secondary | ICD-10-CM | POA: Diagnosis not present

## 2015-04-30 DIAGNOSIS — D509 Iron deficiency anemia, unspecified: Secondary | ICD-10-CM | POA: Diagnosis not present

## 2015-04-30 DIAGNOSIS — K2980 Duodenitis without bleeding: Secondary | ICD-10-CM | POA: Diagnosis not present

## 2015-04-30 DIAGNOSIS — I671 Cerebral aneurysm, nonruptured: Secondary | ICD-10-CM | POA: Diagnosis not present

## 2015-04-30 DIAGNOSIS — K5900 Constipation, unspecified: Secondary | ICD-10-CM | POA: Diagnosis not present

## 2015-04-30 DIAGNOSIS — I509 Heart failure, unspecified: Secondary | ICD-10-CM | POA: Diagnosis not present

## 2015-05-10 DIAGNOSIS — I5032 Chronic diastolic (congestive) heart failure: Secondary | ICD-10-CM | POA: Diagnosis not present

## 2015-05-10 DIAGNOSIS — F0390 Unspecified dementia without behavioral disturbance: Secondary | ICD-10-CM | POA: Diagnosis not present

## 2015-05-10 DIAGNOSIS — M6281 Muscle weakness (generalized): Secondary | ICD-10-CM | POA: Diagnosis not present

## 2015-05-10 DIAGNOSIS — I130 Hypertensive heart and chronic kidney disease with heart failure and stage 1 through stage 4 chronic kidney disease, or unspecified chronic kidney disease: Secondary | ICD-10-CM | POA: Diagnosis not present

## 2015-05-10 DIAGNOSIS — N183 Chronic kidney disease, stage 3 (moderate): Secondary | ICD-10-CM | POA: Diagnosis not present

## 2015-05-11 DIAGNOSIS — I130 Hypertensive heart and chronic kidney disease with heart failure and stage 1 through stage 4 chronic kidney disease, or unspecified chronic kidney disease: Secondary | ICD-10-CM | POA: Diagnosis not present

## 2015-05-11 DIAGNOSIS — M6281 Muscle weakness (generalized): Secondary | ICD-10-CM | POA: Diagnosis not present

## 2015-05-11 DIAGNOSIS — I5032 Chronic diastolic (congestive) heart failure: Secondary | ICD-10-CM | POA: Diagnosis not present

## 2015-05-11 DIAGNOSIS — F0390 Unspecified dementia without behavioral disturbance: Secondary | ICD-10-CM | POA: Diagnosis not present

## 2015-05-11 DIAGNOSIS — N183 Chronic kidney disease, stage 3 (moderate): Secondary | ICD-10-CM | POA: Diagnosis not present

## 2015-05-16 DIAGNOSIS — I130 Hypertensive heart and chronic kidney disease with heart failure and stage 1 through stage 4 chronic kidney disease, or unspecified chronic kidney disease: Secondary | ICD-10-CM | POA: Diagnosis not present

## 2015-05-16 DIAGNOSIS — F0390 Unspecified dementia without behavioral disturbance: Secondary | ICD-10-CM | POA: Diagnosis not present

## 2015-05-16 DIAGNOSIS — N183 Chronic kidney disease, stage 3 (moderate): Secondary | ICD-10-CM | POA: Diagnosis not present

## 2015-05-16 DIAGNOSIS — I5032 Chronic diastolic (congestive) heart failure: Secondary | ICD-10-CM | POA: Diagnosis not present

## 2015-05-16 DIAGNOSIS — M6281 Muscle weakness (generalized): Secondary | ICD-10-CM | POA: Diagnosis not present

## 2015-05-19 DIAGNOSIS — F0390 Unspecified dementia without behavioral disturbance: Secondary | ICD-10-CM | POA: Diagnosis not present

## 2015-05-19 DIAGNOSIS — M6281 Muscle weakness (generalized): Secondary | ICD-10-CM | POA: Diagnosis not present

## 2015-05-19 DIAGNOSIS — I130 Hypertensive heart and chronic kidney disease with heart failure and stage 1 through stage 4 chronic kidney disease, or unspecified chronic kidney disease: Secondary | ICD-10-CM | POA: Diagnosis not present

## 2015-05-19 DIAGNOSIS — I5032 Chronic diastolic (congestive) heart failure: Secondary | ICD-10-CM | POA: Diagnosis not present

## 2015-05-19 DIAGNOSIS — N183 Chronic kidney disease, stage 3 (moderate): Secondary | ICD-10-CM | POA: Diagnosis not present

## 2015-05-22 DIAGNOSIS — F0390 Unspecified dementia without behavioral disturbance: Secondary | ICD-10-CM | POA: Diagnosis not present

## 2015-05-22 DIAGNOSIS — N183 Chronic kidney disease, stage 3 (moderate): Secondary | ICD-10-CM | POA: Diagnosis not present

## 2015-05-22 DIAGNOSIS — I5032 Chronic diastolic (congestive) heart failure: Secondary | ICD-10-CM | POA: Diagnosis not present

## 2015-05-22 DIAGNOSIS — M6281 Muscle weakness (generalized): Secondary | ICD-10-CM | POA: Diagnosis not present

## 2015-05-22 DIAGNOSIS — I130 Hypertensive heart and chronic kidney disease with heart failure and stage 1 through stage 4 chronic kidney disease, or unspecified chronic kidney disease: Secondary | ICD-10-CM | POA: Diagnosis not present

## 2015-05-26 DIAGNOSIS — F0390 Unspecified dementia without behavioral disturbance: Secondary | ICD-10-CM | POA: Diagnosis not present

## 2015-05-26 DIAGNOSIS — I130 Hypertensive heart and chronic kidney disease with heart failure and stage 1 through stage 4 chronic kidney disease, or unspecified chronic kidney disease: Secondary | ICD-10-CM | POA: Diagnosis not present

## 2015-05-26 DIAGNOSIS — M6281 Muscle weakness (generalized): Secondary | ICD-10-CM | POA: Diagnosis not present

## 2015-05-26 DIAGNOSIS — I5032 Chronic diastolic (congestive) heart failure: Secondary | ICD-10-CM | POA: Diagnosis not present

## 2015-05-26 DIAGNOSIS — N183 Chronic kidney disease, stage 3 (moderate): Secondary | ICD-10-CM | POA: Diagnosis not present

## 2015-05-29 DIAGNOSIS — M6281 Muscle weakness (generalized): Secondary | ICD-10-CM | POA: Diagnosis not present

## 2015-05-29 DIAGNOSIS — I5032 Chronic diastolic (congestive) heart failure: Secondary | ICD-10-CM | POA: Diagnosis not present

## 2015-05-29 DIAGNOSIS — I130 Hypertensive heart and chronic kidney disease with heart failure and stage 1 through stage 4 chronic kidney disease, or unspecified chronic kidney disease: Secondary | ICD-10-CM | POA: Diagnosis not present

## 2015-05-29 DIAGNOSIS — F0390 Unspecified dementia without behavioral disturbance: Secondary | ICD-10-CM | POA: Diagnosis not present

## 2015-05-29 DIAGNOSIS — N183 Chronic kidney disease, stage 3 (moderate): Secondary | ICD-10-CM | POA: Diagnosis not present

## 2015-06-01 ENCOUNTER — Encounter (HOSPITAL_COMMUNITY): Payer: Self-pay | Admitting: Emergency Medicine

## 2015-06-01 ENCOUNTER — Inpatient Hospital Stay (HOSPITAL_COMMUNITY)
Admission: EM | Admit: 2015-06-01 | Discharge: 2015-06-08 | DRG: 378 | Disposition: A | Discharge status: Skilled Nursing Facility | Payer: Medicare Other | Attending: Internal Medicine | Admitting: Internal Medicine

## 2015-06-01 ENCOUNTER — Emergency Department (HOSPITAL_COMMUNITY): Payer: Medicare Other

## 2015-06-01 DIAGNOSIS — K262 Acute duodenal ulcer with both hemorrhage and perforation: Principal | ICD-10-CM | POA: Diagnosis present

## 2015-06-01 DIAGNOSIS — I10 Essential (primary) hypertension: Secondary | ICD-10-CM | POA: Diagnosis present

## 2015-06-01 DIAGNOSIS — R55 Syncope and collapse: Secondary | ICD-10-CM | POA: Diagnosis present

## 2015-06-01 DIAGNOSIS — K5909 Other constipation: Secondary | ICD-10-CM | POA: Diagnosis present

## 2015-06-01 DIAGNOSIS — K8000 Calculus of gallbladder with acute cholecystitis without obstruction: Secondary | ICD-10-CM | POA: Diagnosis present

## 2015-06-01 DIAGNOSIS — I4891 Unspecified atrial fibrillation: Secondary | ICD-10-CM | POA: Diagnosis not present

## 2015-06-01 DIAGNOSIS — R531 Weakness: Secondary | ICD-10-CM | POA: Diagnosis not present

## 2015-06-01 DIAGNOSIS — R627 Adult failure to thrive: Secondary | ICD-10-CM | POA: Diagnosis present

## 2015-06-01 DIAGNOSIS — R109 Unspecified abdominal pain: Secondary | ICD-10-CM | POA: Diagnosis present

## 2015-06-01 DIAGNOSIS — N179 Acute kidney failure, unspecified: Secondary | ICD-10-CM | POA: Diagnosis present

## 2015-06-01 DIAGNOSIS — M6281 Muscle weakness (generalized): Secondary | ICD-10-CM | POA: Diagnosis not present

## 2015-06-01 DIAGNOSIS — K922 Gastrointestinal hemorrhage, unspecified: Secondary | ICD-10-CM | POA: Diagnosis present

## 2015-06-01 DIAGNOSIS — K921 Melena: Secondary | ICD-10-CM | POA: Diagnosis present

## 2015-06-01 DIAGNOSIS — N183 Chronic kidney disease, stage 3 (moderate): Secondary | ICD-10-CM | POA: Diagnosis present

## 2015-06-01 DIAGNOSIS — K263 Acute duodenal ulcer without hemorrhage or perforation: Secondary | ICD-10-CM

## 2015-06-01 DIAGNOSIS — I130 Hypertensive heart and chronic kidney disease with heart failure and stage 1 through stage 4 chronic kidney disease, or unspecified chronic kidney disease: Secondary | ICD-10-CM | POA: Diagnosis not present

## 2015-06-01 DIAGNOSIS — Z79899 Other long term (current) drug therapy: Secondary | ICD-10-CM

## 2015-06-01 DIAGNOSIS — Z66 Do not resuscitate: Secondary | ICD-10-CM | POA: Diagnosis present

## 2015-06-01 DIAGNOSIS — R7989 Other specified abnormal findings of blood chemistry: Secondary | ICD-10-CM | POA: Diagnosis present

## 2015-06-01 DIAGNOSIS — N189 Chronic kidney disease, unspecified: Secondary | ICD-10-CM | POA: Diagnosis present

## 2015-06-01 DIAGNOSIS — Z6820 Body mass index (BMI) 20.0-20.9, adult: Secondary | ICD-10-CM

## 2015-06-01 DIAGNOSIS — R52 Pain, unspecified: Secondary | ICD-10-CM

## 2015-06-01 DIAGNOSIS — I5032 Chronic diastolic (congestive) heart failure: Secondary | ICD-10-CM | POA: Diagnosis not present

## 2015-06-01 DIAGNOSIS — F0390 Unspecified dementia without behavioral disturbance: Secondary | ICD-10-CM | POA: Diagnosis not present

## 2015-06-01 DIAGNOSIS — D509 Iron deficiency anemia, unspecified: Secondary | ICD-10-CM | POA: Diagnosis present

## 2015-06-01 DIAGNOSIS — R251 Tremor, unspecified: Secondary | ICD-10-CM | POA: Diagnosis present

## 2015-06-01 DIAGNOSIS — K5750 Diverticulosis of both small and large intestine without perforation or abscess without bleeding: Secondary | ICD-10-CM | POA: Diagnosis present

## 2015-06-01 DIAGNOSIS — K219 Gastro-esophageal reflux disease without esophagitis: Secondary | ICD-10-CM | POA: Diagnosis present

## 2015-06-01 DIAGNOSIS — E860 Dehydration: Secondary | ICD-10-CM | POA: Diagnosis present

## 2015-06-01 DIAGNOSIS — D62 Acute posthemorrhagic anemia: Secondary | ICD-10-CM | POA: Diagnosis not present

## 2015-06-01 DIAGNOSIS — R092 Respiratory arrest: Secondary | ICD-10-CM | POA: Diagnosis not present

## 2015-06-01 DIAGNOSIS — Z96641 Presence of right artificial hip joint: Secondary | ICD-10-CM | POA: Diagnosis present

## 2015-06-01 DIAGNOSIS — E46 Unspecified protein-calorie malnutrition: Secondary | ICD-10-CM | POA: Diagnosis not present

## 2015-06-01 DIAGNOSIS — K429 Umbilical hernia without obstruction or gangrene: Secondary | ICD-10-CM | POA: Diagnosis present

## 2015-06-01 LAB — POC OCCULT BLOOD, ED: FECAL OCCULT BLD: POSITIVE — AB

## 2015-06-01 LAB — URINALYSIS, ROUTINE W REFLEX MICROSCOPIC
BILIRUBIN URINE: NEGATIVE
Glucose, UA: NEGATIVE mg/dL
Hgb urine dipstick: NEGATIVE
Ketones, ur: NEGATIVE mg/dL
NITRITE: NEGATIVE
PH: 7 (ref 5.0–8.0)
Protein, ur: 30 mg/dL — AB
SPECIFIC GRAVITY, URINE: 1.017 (ref 1.005–1.030)

## 2015-06-01 LAB — CBC
HEMATOCRIT: 33.1 % — AB (ref 36.0–46.0)
HEMOGLOBIN: 10.9 g/dL — AB (ref 12.0–15.0)
MCH: 30.2 pg (ref 26.0–34.0)
MCHC: 32.9 g/dL (ref 30.0–36.0)
MCV: 91.7 fL (ref 78.0–100.0)
Platelets: 269 10*3/uL (ref 150–400)
RBC: 3.61 MIL/uL — ABNORMAL LOW (ref 3.87–5.11)
RDW: 14.7 % (ref 11.5–15.5)
WBC: 7.8 10*3/uL (ref 4.0–10.5)

## 2015-06-01 LAB — BASIC METABOLIC PANEL
ANION GAP: 11 (ref 5–15)
BUN: 27 mg/dL — ABNORMAL HIGH (ref 6–20)
CO2: 29 mmol/L (ref 22–32)
Calcium: 10.1 mg/dL (ref 8.9–10.3)
Chloride: 98 mmol/L — ABNORMAL LOW (ref 101–111)
Creatinine, Ser: 1.1 mg/dL — ABNORMAL HIGH (ref 0.44–1.00)
GFR calc Af Amer: 48 mL/min — ABNORMAL LOW (ref >60)
GFR calc non Af Amer: 41 mL/min — ABNORMAL LOW (ref >60)
GLUCOSE: 151 mg/dL — AB (ref 65–99)
POTASSIUM: 4.2 mmol/L (ref 3.5–5.1)
Sodium: 138 mmol/L (ref 135–145)

## 2015-06-01 LAB — TROPONIN I: Troponin I: <0.03 ng/mL (ref <0.031)

## 2015-06-01 LAB — URINE MICROSCOPIC-ADD ON
RBC / HPF: NONE SEEN RBC/hpf (ref 0–5)
WBC UA: NONE SEEN WBC/hpf (ref 0–5)

## 2015-06-01 LAB — I-STAT CG4 LACTIC ACID, ED: Lactic Acid, Venous: 2.6 mmol/L (ref 0.5–2.0)

## 2015-06-01 LAB — CBG MONITORING, ED: Glucose-Capillary: 129 mg/dL — ABNORMAL HIGH (ref 65–99)

## 2015-06-01 MED ORDER — SODIUM CHLORIDE 0.9 % IV BOLUS (SEPSIS)
1000.0000 mL | Freq: Once | INTRAVENOUS | Status: AC
  Administered 2015-06-01: 1000 mL via INTRAVENOUS

## 2015-06-01 NOTE — ED Notes (Signed)
Called CT for update, stated they would be here soon to get pt

## 2015-06-01 NOTE — ED Notes (Signed)
Pt in EMS after syncopal episode during BM. Pt has agonal RR (4/min) upon EMS and pt was bagged until regained consciousness. Pt was in afib RVR en route. CBG 206.

## 2015-06-01 NOTE — ED Provider Notes (Signed)
CSN: [id number]     Arrival date & time 06/01/15  2044 History   First MD Initiated Contact with Patient 06/01/15 2133     Chief Complaint  Patient presents with  . Loss of Consciousness     (Consider location/radiation/quality/duration/timing/severity/associated sxs/prior Treatment) Patient is a 82 y.o. female presenting with syncope. The history is provided by the patient and a relative. The history is limited by the condition of the patient.  Loss of Consciousness Episode history:  Single Most recent episode:  Today Timing:  Sporadic Progression:  Resolved Chronicity:  New Context: bowel movement (with blood in bm)   Witnessed: no   Relieved by:  None tried Worsened by:  Nothing tried Ineffective treatments:  None tried Associated symptoms: no chest pain, no difficulty breathing, no fever, no headaches, no nausea, no shortness of breath and no vomiting     Past Medical History  Diagnosis Date  . CHF (congestive heart failure) ([place])   . Hypertension   . Aneurysm ([place]) 1972    cerebral  . Acid reflux     occasional  . Tremor     worse on right arm  . History of GI diverticular bleed 06/2009    colonoscopy with endo clipping of bleeding tic by Dr Clarene Essex.  Flex sig by Dr Paulita Fujita.  . Acute blood loss anemia 06/2009    received ~ 6 units blood  . Adenomatous polyp of colon 06/2009  . Diverticulitis   . UTI (lower urinary tract infection)   . GI bleed 02/14/2014   Past Surgical History  Procedure Laterality Date  . Hip fracture surgery  2011  . Colonoscopy w/ control of hemorrhage  06/2009  . Knee arthroscopy    . Cerebral aneurysm repair  1970's  . Colonoscopy  09/22/2011    Procedure: COLONOSCOPY;  Surgeon: Juanita Craver, MD;  Location: [facility] ENDOSCOPY;  Service: Endoscopy;  Laterality: N/A;  wants ped scope   Family History  Problem Relation Age of Onset  . Stroke Mother   . Lung disease Father   . Stroke Brother    Social History  Substance Use Topics  . Smoking  status: Never Smoker   . Smokeless tobacco: Never Used  . Alcohol Use: No   OB History    No data available     Review of Systems  Constitutional: Negative for fever.  HENT: Negative.   Eyes: Negative for visual disturbance.  Respiratory: Negative for shortness of breath.   Cardiovascular: Positive for syncope. Negative for chest pain.  Gastrointestinal: Positive for abdominal pain, diarrhea and blood in stool. Negative for nausea and vomiting.  Musculoskeletal: Negative.   Skin: Negative for pallor, rash and wound.  Neurological: Negative for headaches.      Allergies  Review of patient's allergies indicates no known allergies.  Home Medications   Prior to Admission medications   Medication Sig Start Date End Date Taking? Authorizing Provider  amLODipine (NORVASC) 5 MG tablet Take 5 mg by mouth daily.   Yes Historical Provider, MD  Ascorbic Acid (VITAMIN C) 100 MG tablet Take 500 mg by mouth daily. With rose hips 500 mg   Yes Historical Provider, MD  Calcium-Magnesium-Vitamin D 185-50-100 MG-MG-UNIT CAPS Take 1 capsule by mouth daily.   Yes Historical Provider, MD  cholecalciferol (VITAMIN D) 1000 UNITS tablet Take 1,000 Units by mouth daily.   Yes Historical Provider, MD  docusate sodium (COLACE) 100 MG capsule Take 100 mg by mouth at bedtime.   Yes Historical  Provider, MD  ferrous sulfate 325 (65 FE) MG tablet Take 325 mg by mouth daily with breakfast.   Yes Historical Provider, MD  furosemide (LASIX) 20 MG tablet Take 20 mg by mouth daily.    Yes Historical Provider, MD  guaifenesin (ROBITUSSIN) 100 MG/5ML syrup Take 200 mg by mouth 3 (three) times daily as needed for cough.   Yes Historical Provider, MD  ranitidine (ZANTAC) 150 MG tablet Take 150 mg by mouth 2 (two) times daily as needed for heartburn.   Yes Historical Provider, MD  vitamin B-12 500 MCG tablet Take 1 tablet (500 mcg total) by mouth daily. 03/26/15  Yes David Tat, MD   BP 138/55 mmHg  Pulse 86  Temp(Src)  98.1 F (36.7 C) (Oral)  Resp 15  Ht 5\' 5"  (1.651 m)  Wt 56.926 kg  BMI 20.88 kg/m2  SpO2 96% Physical Exam  Constitutional: She is oriented to person, place, and time. Vital signs are normal. She appears ill.  HENT:  Head: Normocephalic and atraumatic.  Eyes: Pupils are equal, round, and reactive to light. No scleral icterus.  Neck: Normal range of motion. Neck supple.  Cardiovascular: Normal rate, regular rhythm, normal heart sounds and intact distal pulses.   Pulmonary/Chest: Effort normal and breath sounds normal. She exhibits no tenderness.  Abdominal: Soft. She exhibits no distension. There is tenderness (LLQ ttp). There is guarding. There is no rebound.  Genitourinary: Guaiac positive stool.  Musculoskeletal: Normal range of motion. She exhibits no edema or tenderness.  Neurological: She is alert and oriented to person, place, and time. No cranial nerve deficit. She exhibits normal muscle tone. Coordination normal.  Skin: Skin is warm and dry. No rash noted. No erythema. No pallor.    ED Course  Procedures (including critical care time) Labs Review Labs Reviewed  BASIC METABOLIC PANEL - Abnormal; Notable for the following:    Chloride 98 (*)    Glucose, Bld 151 (*)    BUN 27 (*)    Creatinine, Ser 1.10 (*)    GFR calc non Af Amer 41 (*)    GFR calc Af Amer 48 (*)    All other components within normal limits  CBC - Abnormal; Notable for the following:    RBC 3.61 (*)    Hemoglobin 10.9 (*)    HCT 33.1 (*)    All other components within normal limits  URINALYSIS, ROUTINE W REFLEX MICROSCOPIC (NOT AT [facility]) - Abnormal; Notable for the following:    APPearance CLOUDY (*)    Protein, ur 30 (*)    Leukocytes, UA TRACE (*)    All other components within normal limits  URINE MICROSCOPIC-ADD ON - Abnormal; Notable for the following:    Squamous Epithelial / LPF 0-5 (*)    Bacteria, UA FEW (*)    All other components within normal limits  CBC - Abnormal; Notable for the  following:    WBC 10.8 (*)    RBC 3.21 (*)    Hemoglobin 9.8 (*)    HCT 29.7 (*)    All other components within normal limits  CBC - Abnormal; Notable for the following:    RBC 3.03 (*)    Hemoglobin 9.2 (*)    HCT 27.7 (*)    All other components within normal limits  PROTIME-INR - Abnormal; Notable for the following:    Prothrombin Time 15.9 (*)    All other components within normal limits  LIPID PANEL - Abnormal; Notable for the following:  HDL 39 (*)    All other components within normal limits  CBG MONITORING, ED - Abnormal; Notable for the following:    Glucose-Capillary 129 (*)    All other components within normal limits  POC OCCULT BLOOD, ED - Abnormal; Notable for the following:    Fecal Occult Bld POSITIVE (*)    All other components within normal limits  I-STAT CG4 LACTIC ACID, ED - Abnormal; Notable for the following:    Lactic Acid, Venous 2.60 (*)    All other components within normal limits  TROPONIN I  APTT  TROPONIN I  TROPONIN I  LIPASE, BLOOD  BRAIN NATRIURETIC PEPTIDE  CK  TROPONIN I  HEMOGLOBIN [id number]  BASIC METABOLIC PANEL  I-STAT CG4 LACTIC ACID, ED  TYPE AND SCREEN    Imaging Review Ct Abdomen Pelvis W Contrast  06/02/2015  CLINICAL DATA:  Syncopal episode during bowel movement tonight. LEFT lower quadrant pain. History of hypertension, aneurysm, diverticulitis, GI bleed, colonic polyps. EXAM: CT ABDOMEN AND PELVIS WITH CONTRAST TECHNIQUE: Multidetector CT imaging of the abdomen and pelvis was performed using the standard protocol following bolus administration of intravenous contrast. CONTRAST:  49mL OMNIPAQUE IOHEXOL 300 MG/ML  SOLN COMPARISON:  CT abdomen and pelvis March 22, 2015 FINDINGS: LUNG BASES: Included view of the lung bases are clear. Visualized heart and pericardium are unremarkable. SOLID ORGANS: The liver, spleen, and adrenal glands are unremarkable. Multiple subcentimeter gallstones without CT findings of acute cholecystitis. Fatty  replaced pancreas. GASTROINTESTINAL TRACT: Small fluid-filled apparent ulceration with inflammation duodenum bulb. Moderate hiatal hernia. Proximal duodenum diverticulum. The stomach, and large bowel are normal in course and caliber without inflammatory changes. Severe colonic diverticulosis. Suspected interval appendectomy. KIDNEYS/ URINARY TRACT: Kidneys are orthotopic, demonstrating symmetric enhancement. No nephrolithiasis, hydronephrosis or solid renal masses. 1 cm LEFT lower pole cyst. The unopacified ureters are normal in course and caliber. Delayed imaging through the kidneys demonstrates symmetric prompt contrast excretion within the proximal urinary collecting system. Urinary bladder is partially distended and unremarkable. PERITONEUM/RETROPERITONEUM: Aortoiliac vessels are normal in course and caliber, moderate to severe calcific atherosclerosis. No lymphadenopathy by CT size criteria. Status post hysterectomy. No intraperitoneal free fluid nor free air. SOFT TISSUE/OSSEOUS STRUCTURES: Non-suspicious. RIGHT hip total arthroplasty results in streak artifact. Old L1 moderate to severe compression fracture. Osteopenia. Moderate fat containing umbilical hernia. IMPRESSION: Suspected duodenal ulcer with surrounding inflammatory changes concerning for perforation without pneumoperitoneum. Again recommended is endoscopy. Severe colonic diverticulosis without CT findings of acute diverticulitis. Cholelithiasis without CT findings of acute cholecystitis. Electronically Signed   By: Elon Alas M.D.   On: 06/02/2015 01:09   Dg Chest Portable 1 View  06/01/2015  CLINICAL DATA:  Syncope and weakness EXAM: PORTABLE CHEST 1 VIEW COMPARISON:  10/08/2014 FINDINGS: The heart size and mediastinal contours are within normal limits. Aortic calcifications noted. Small hiatal hernia noted. Both lungs are clear. The visualized skeletal structures are unremarkable. IMPRESSION: No active disease. Electronically Signed    By: Kerby Moors M.D.   On: 06/01/2015 21:59   I have personally reviewed and evaluated these images and lab results as part of my medical decision-making.   EKG Interpretation   Date/Time:  Thursday June 01 2015 20:51:06 EST Ventricular Rate:  82 PR Interval:  165 QRS Duration: 92 QT Interval:  378 QTC Calculation: 441 R Axis:   103 Text Interpretation:  Sinus rhythm Atrial premature complex Right axis  deviation Low voltage, extremity leads Borderline repolarization  abnormality When compared with ECG of 03/22/2015,  No significant change  was found Confirmed by [facility]  MD, DAVID ([id number]) on 06/01/2015 8:55:08 PM      MDM   Final diagnoses:  Syncope, unspecified syncope type  Gastrointestinal hemorrhage, unspecified gastritis, unspecified gastrointestinal hemorrhage type    Patient is a 83 year old female with multiple medical problems who presents after a prolonged syncopal episode after having a bowel movement today at home. Per EMS had agonal respirations requiring BVM until she regained consciousness. History of GIB requiring transfusions in the past. Further history and exam as above, VSS and TTP along LLQ.  No leukocytosis, stable hb, elevated lactic acid, and ekg without acute ischemic changes. CT a/p obtained with evidence of duodenal ulcer and diverticula. Patient will be admitted to medicine for further management and evaluation.     Heriberto Antigua, MD [id number]  Delora Fuel, MD [id number]

## 2015-06-01 NOTE — ED Notes (Signed)
EDP at bedside  

## 2015-06-02 ENCOUNTER — Inpatient Hospital Stay (HOSPITAL_COMMUNITY): Payer: Medicare Other

## 2015-06-02 ENCOUNTER — Encounter (HOSPITAL_COMMUNITY): Payer: Self-pay | Admitting: Radiology

## 2015-06-02 ENCOUNTER — Emergency Department (HOSPITAL_COMMUNITY): Payer: Medicare Other

## 2015-06-02 DIAGNOSIS — R1032 Left lower quadrant pain: Secondary | ICD-10-CM | POA: Diagnosis not present

## 2015-06-02 DIAGNOSIS — Z66 Do not resuscitate: Secondary | ICD-10-CM | POA: Diagnosis present

## 2015-06-02 DIAGNOSIS — E46 Unspecified protein-calorie malnutrition: Secondary | ICD-10-CM | POA: Diagnosis present

## 2015-06-02 DIAGNOSIS — D62 Acute posthemorrhagic anemia: Secondary | ICD-10-CM | POA: Diagnosis not present

## 2015-06-02 DIAGNOSIS — D509 Iron deficiency anemia, unspecified: Secondary | ICD-10-CM | POA: Diagnosis not present

## 2015-06-02 DIAGNOSIS — R55 Syncope and collapse: Secondary | ICD-10-CM | POA: Diagnosis not present

## 2015-06-02 DIAGNOSIS — R627 Adult failure to thrive: Secondary | ICD-10-CM | POA: Diagnosis present

## 2015-06-02 DIAGNOSIS — R2681 Unsteadiness on feet: Secondary | ICD-10-CM | POA: Diagnosis not present

## 2015-06-02 DIAGNOSIS — I10 Essential (primary) hypertension: Secondary | ICD-10-CM | POA: Diagnosis not present

## 2015-06-02 DIAGNOSIS — K260 Acute duodenal ulcer with hemorrhage: Secondary | ICD-10-CM | POA: Diagnosis not present

## 2015-06-02 DIAGNOSIS — K921 Melena: Secondary | ICD-10-CM | POA: Diagnosis present

## 2015-06-02 DIAGNOSIS — K219 Gastro-esophageal reflux disease without esophagitis: Secondary | ICD-10-CM

## 2015-06-02 DIAGNOSIS — I5032 Chronic diastolic (congestive) heart failure: Secondary | ICD-10-CM | POA: Diagnosis not present

## 2015-06-02 DIAGNOSIS — E860 Dehydration: Secondary | ICD-10-CM | POA: Diagnosis present

## 2015-06-02 DIAGNOSIS — K8020 Calculus of gallbladder without cholecystitis without obstruction: Secondary | ICD-10-CM | POA: Diagnosis not present

## 2015-06-02 DIAGNOSIS — K5909 Other constipation: Secondary | ICD-10-CM | POA: Diagnosis present

## 2015-06-02 DIAGNOSIS — K922 Gastrointestinal hemorrhage, unspecified: Secondary | ICD-10-CM | POA: Diagnosis not present

## 2015-06-02 DIAGNOSIS — Z452 Encounter for adjustment and management of vascular access device: Secondary | ICD-10-CM | POA: Diagnosis not present

## 2015-06-02 DIAGNOSIS — R1311 Dysphagia, oral phase: Secondary | ICD-10-CM | POA: Diagnosis not present

## 2015-06-02 DIAGNOSIS — K8000 Calculus of gallbladder with acute cholecystitis without obstruction: Secondary | ICD-10-CM | POA: Diagnosis present

## 2015-06-02 DIAGNOSIS — I509 Heart failure, unspecified: Secondary | ICD-10-CM | POA: Diagnosis not present

## 2015-06-02 DIAGNOSIS — Z79899 Other long term (current) drug therapy: Secondary | ICD-10-CM | POA: Diagnosis not present

## 2015-06-02 DIAGNOSIS — R740 Nonspecific elevation of levels of transaminase and lactic acid dehydrogenase [LDH]: Secondary | ICD-10-CM | POA: Diagnosis not present

## 2015-06-02 DIAGNOSIS — N182 Chronic kidney disease, stage 2 (mild): Secondary | ICD-10-CM | POA: Diagnosis not present

## 2015-06-02 DIAGNOSIS — M6281 Muscle weakness (generalized): Secondary | ICD-10-CM | POA: Diagnosis not present

## 2015-06-02 DIAGNOSIS — Z6820 Body mass index (BMI) 20.0-20.9, adult: Secondary | ICD-10-CM | POA: Diagnosis not present

## 2015-06-02 DIAGNOSIS — I130 Hypertensive heart and chronic kidney disease with heart failure and stage 1 through stage 4 chronic kidney disease, or unspecified chronic kidney disease: Secondary | ICD-10-CM | POA: Diagnosis present

## 2015-06-02 DIAGNOSIS — B9681 Helicobacter pylori [H. pylori] as the cause of diseases classified elsewhere: Secondary | ICD-10-CM | POA: Diagnosis not present

## 2015-06-02 DIAGNOSIS — N189 Chronic kidney disease, unspecified: Secondary | ICD-10-CM | POA: Diagnosis not present

## 2015-06-02 DIAGNOSIS — Z96641 Presence of right artificial hip joint: Secondary | ICD-10-CM | POA: Diagnosis present

## 2015-06-02 DIAGNOSIS — I4891 Unspecified atrial fibrillation: Secondary | ICD-10-CM | POA: Diagnosis present

## 2015-06-02 DIAGNOSIS — N183 Chronic kidney disease, stage 3 (moderate): Secondary | ICD-10-CM | POA: Diagnosis present

## 2015-06-02 DIAGNOSIS — N179 Acute kidney failure, unspecified: Secondary | ICD-10-CM | POA: Diagnosis not present

## 2015-06-02 DIAGNOSIS — R933 Abnormal findings on diagnostic imaging of other parts of digestive tract: Secondary | ICD-10-CM | POA: Diagnosis not present

## 2015-06-02 DIAGNOSIS — D500 Iron deficiency anemia secondary to blood loss (chronic): Secondary | ICD-10-CM | POA: Diagnosis not present

## 2015-06-02 DIAGNOSIS — R7989 Other specified abnormal findings of blood chemistry: Secondary | ICD-10-CM | POA: Diagnosis present

## 2015-06-02 DIAGNOSIS — K261 Acute duodenal ulcer with perforation: Secondary | ICD-10-CM | POA: Diagnosis not present

## 2015-06-02 DIAGNOSIS — K5750 Diverticulosis of both small and large intestine without perforation or abscess without bleeding: Secondary | ICD-10-CM | POA: Diagnosis present

## 2015-06-02 DIAGNOSIS — K263 Acute duodenal ulcer without hemorrhage or perforation: Secondary | ICD-10-CM | POA: Diagnosis not present

## 2015-06-02 DIAGNOSIS — R109 Unspecified abdominal pain: Secondary | ICD-10-CM | POA: Diagnosis present

## 2015-06-02 DIAGNOSIS — K262 Acute duodenal ulcer with both hemorrhage and perforation: Secondary | ICD-10-CM | POA: Diagnosis present

## 2015-06-02 DIAGNOSIS — R251 Tremor, unspecified: Secondary | ICD-10-CM | POA: Diagnosis present

## 2015-06-02 DIAGNOSIS — R52 Pain, unspecified: Secondary | ICD-10-CM | POA: Diagnosis not present

## 2015-06-02 DIAGNOSIS — K429 Umbilical hernia without obstruction or gangrene: Secondary | ICD-10-CM | POA: Diagnosis present

## 2015-06-02 DIAGNOSIS — F0390 Unspecified dementia without behavioral disturbance: Secondary | ICD-10-CM | POA: Diagnosis not present

## 2015-06-02 DIAGNOSIS — K8080 Other cholelithiasis without obstruction: Secondary | ICD-10-CM | POA: Diagnosis not present

## 2015-06-02 LAB — PROTIME-INR
INR: 1.25 (ref 0.00–1.49)
PROTHROMBIN TIME: 15.9 s — AB (ref 11.6–15.2)

## 2015-06-02 LAB — BASIC METABOLIC PANEL
ANION GAP: 7 (ref 5–15)
BUN: 25 mg/dL — ABNORMAL HIGH (ref 6–20)
CALCIUM: 8.9 mg/dL (ref 8.9–10.3)
CO2: 27 mmol/L (ref 22–32)
CREATININE: 0.87 mg/dL (ref 0.44–1.00)
Chloride: 110 mmol/L (ref 101–111)
GFR, EST NON AFRICAN AMERICAN: 55 mL/min — AB (ref >60)
GLUCOSE: 96 mg/dL (ref 65–99)
Potassium: 3.7 mmol/L (ref 3.5–5.1)
Sodium: 144 mmol/L (ref 135–145)

## 2015-06-02 LAB — CBC
HCT: 29.7 % — ABNORMAL LOW (ref 36.0–46.0)
HEMATOCRIT: 27.7 % — AB (ref 36.0–46.0)
Hemoglobin: 9.8 g/dL — ABNORMAL LOW (ref 12.0–15.0)
Hemoglobin: 9.2 g/dL — ABNORMAL LOW (ref 12.0–15.0)
MCH: 30.5 pg (ref 26.0–34.0)
MCH: 30.4 pg (ref 26.0–34.0)
MCHC: 33 g/dL (ref 30.0–36.0)
MCHC: 33.2 g/dL (ref 30.0–36.0)
MCV: 92.5 fL (ref 78.0–100.0)
MCV: 91.4 fL (ref 78.0–100.0)
PLATELETS: 236 10*3/uL (ref 150–400)
Platelets: 246 10*3/uL (ref 150–400)
RBC: 3.21 MIL/uL — AB (ref 3.87–5.11)
RBC: 3.03 MIL/uL — ABNORMAL LOW (ref 3.87–5.11)
RDW: 14.8 % (ref 11.5–15.5)
RDW: 14.9 % (ref 11.5–15.5)
WBC: 10.8 10*3/uL — AB (ref 4.0–10.5)
WBC: 9.1 10*3/uL (ref 4.0–10.5)

## 2015-06-02 LAB — LIPID PANEL
CHOL/HDL RATIO: 2.1 ratio
CHOLESTEROL: 82 mg/dL (ref 0–200)
HDL: 39 mg/dL — AB (ref >40)
LDL Cholesterol: 32 mg/dL (ref 0–99)
TRIGLYCERIDES: 56 mg/dL (ref <150)
VLDL: 11 mg/dL (ref 0–40)

## 2015-06-02 LAB — TROPONIN I: Troponin I: <0.03 ng/mL (ref <0.031)

## 2015-06-02 LAB — APTT: APTT: 29 s (ref 24–37)

## 2015-06-02 LAB — CK: Total CK: 60 U/L (ref 38–234)

## 2015-06-02 LAB — LIPASE, BLOOD: Lipase: 16 U/L (ref 11–51)

## 2015-06-02 LAB — MRSA PCR SCREENING: MRSA by PCR: NEGATIVE

## 2015-06-02 LAB — BRAIN NATRIURETIC PEPTIDE: B Natriuretic Peptide: 61.4 pg/mL (ref 0.0–100.0)

## 2015-06-02 MED ORDER — CIPROFLOXACIN IN D5W 400 MG/200ML IV SOLN
400.0000 mg | Freq: Two times a day (BID) | INTRAVENOUS | Status: DC
  Administered 2015-06-02 – 2015-06-04 (×4): 400 mg via INTRAVENOUS
  Filled 2015-06-02 (×5): qty 200

## 2015-06-02 MED ORDER — DOCUSATE SODIUM 100 MG PO CAPS: 100.0000 mg | ORAL_CAPSULE | Freq: Every day | ORAL | Status: DC | PRN

## 2015-06-02 MED ORDER — SODIUM CHLORIDE 0.9 % IV SOLN
INTRAVENOUS | Status: DC
  Administered 2015-06-02 – 2015-06-04 (×2): via INTRAVENOUS

## 2015-06-02 MED ORDER — FAMOTIDINE 20 MG PO TABS: 20.0000 mg | ORAL_TABLET | Freq: Every day | ORAL | Status: DC

## 2015-06-02 MED ORDER — VITAMIN B-12 1000 MCG PO TABS: 500.0000 ug | ORAL_TABLET | Freq: Every day | ORAL | Status: DC

## 2015-06-02 MED ORDER — VITAMIN D 1000 UNITS PO TABS: 1000.0000 [IU] | ORAL_TABLET | Freq: Every day | ORAL | Status: DC

## 2015-06-02 MED ORDER — HYDRALAZINE HCL 20 MG/ML IJ SOLN: 5.0000 mg | INTRAMUSCULAR | Status: DC | PRN

## 2015-06-02 MED ORDER — PANTOPRAZOLE SODIUM 40 MG IV SOLR
40.0000 mg | Freq: Two times a day (BID) | INTRAVENOUS | Status: DC
  Administered 2015-06-02: 40 mg via INTRAVENOUS
  Filled 2015-06-02: qty 40

## 2015-06-02 MED ORDER — PANTOPRAZOLE SODIUM 40 MG IV SOLR
40.0000 mg | Freq: Two times a day (BID) | INTRAVENOUS | Status: DC
  Administered 2015-06-02 – 2015-06-07 (×12): 40 mg via INTRAVENOUS
  Filled 2015-06-02 (×12): qty 40

## 2015-06-02 MED ORDER — VITAMIN C 500 MG PO TABS: 500.0000 mg | ORAL_TABLET | Freq: Every day | ORAL | Status: DC

## 2015-06-02 MED ORDER — METRONIDAZOLE IVPB CUSTOM
250.0000 mg | Freq: Three times a day (TID) | INTRAVENOUS | Status: DC
  Administered 2015-06-02 – 2015-06-04 (×6): 250 mg via INTRAVENOUS
  Filled 2015-06-02: qty 50
  Filled 2015-06-02: qty 100
  Filled 2015-06-02 (×3): qty 50
  Filled 2015-06-02: qty 100
  Filled 2015-06-02: qty 50
  Filled 2015-06-02: qty 100
  Filled 2015-06-02: qty 50
  Filled 2015-06-02: qty 100
  Filled 2015-06-02 (×2): qty 50
  Filled 2015-06-02: qty 100

## 2015-06-02 MED ORDER — FERROUS SULFATE 325 (65 FE) MG PO TABS: 325.0000 mg | ORAL_TABLET | Freq: Every day | ORAL | Status: DC

## 2015-06-02 MED ORDER — SODIUM CHLORIDE 0.9 % IV SOLN
INTRAVENOUS | Status: DC
  Administered 2015-06-02 – 2015-06-05 (×2): via INTRAVENOUS

## 2015-06-02 MED ORDER — STROKE: EARLY STAGES OF RECOVERY BOOK
Freq: Once | Status: DC
  Filled 2015-06-02: qty 1

## 2015-06-02 MED ORDER — AMLODIPINE BESYLATE 5 MG PO TABS: 5.0000 mg | ORAL_TABLET | Freq: Every day | ORAL | Status: DC

## 2015-06-02 MED ORDER — SODIUM CHLORIDE 0.9 % IV SOLN
80.0000 mg | Freq: Once | INTRAVENOUS | Status: AC
  Administered 2015-06-02: 80 mg via INTRAVENOUS
  Filled 2015-06-02 (×2): qty 80

## 2015-06-02 MED ORDER — PANTOPRAZOLE SODIUM 40 MG IV SOLR
8.0000 mg/h | INTRAVENOUS | Status: DC
  Filled 2015-06-02 (×3): qty 80

## 2015-06-02 MED ORDER — CALCIUM CARBONATE-VITAMIN D 500-200 MG-UNIT PO TABS: 1.0000 | ORAL_TABLET | Freq: Every day | ORAL | Status: DC

## 2015-06-02 MED ORDER — IOHEXOL 300 MG/ML  SOLN
80.0000 mL | Freq: Once | INTRAMUSCULAR | Status: AC | PRN
  Administered 2015-06-02: 80 mL via INTRAVENOUS

## 2015-06-02 MED ORDER — PANTOPRAZOLE SODIUM 40 MG IV SOLR: 40.0000 mg | Freq: Two times a day (BID) | INTRAVENOUS | Status: DC

## 2015-06-02 MED ORDER — ONDANSETRON HCL 4 MG/2ML IJ SOLN: 4.0000 mg | Freq: Three times a day (TID) | INTRAMUSCULAR | Status: DC | PRN

## 2015-06-02 MED ORDER — GUAIFENESIN 100 MG/5ML PO SYRP: 200.0000 mg | ORAL_SOLUTION | Freq: Three times a day (TID) | ORAL | Status: DC | PRN

## 2015-06-02 MED ORDER — MORPHINE SULFATE (PF) 2 MG/ML IV SOLN: 1.0000 mg | INTRAVENOUS | Status: DC | PRN

## 2015-06-02 NOTE — Progress Notes (Addendum)
Initial Nutrition Assessment  DOCUMENTATION CODES:   Not applicable  INTERVENTION:    Advance diet as medically appropriate, add supplements as able  NUTRITION DIAGNOSIS:   Inadequate oral intake related to inability to eat as evidenced by NPO status   GOAL:   Patient will meet greater than or equal to 90% of their needs  MONITOR:   Diet advancement, PO intake, Labs, Weight trends, I & O's  REASON FOR ASSESSMENT:   Low Braden  ASSESSMENT:   82 yo Female with multiple medical issues including dementia, history of diverticulitis, duodenitis, HTN, CHF, GERD who was admitted early this morning for syncope and hematochezia. She apparently passed out while having a BM. There was gross blood in the toilet. Apparently, she had been complaining of some LLQ abdominal pain over the last several days.    CT scan showed a probable duodenal ulcer with no pneumoperitoneum.  Patient slightly confused upon RD visit.  Did state she has a fairly good appetite.  S/p bedside swallow evaluation.  SLP recommending Dys 2, thin liquid diet.  GI and Surgery notes reviewed.  Recommending medical management; pt is a poor candidate and does not need surgery at this time.  Low braden score places patient at risk for skin breakdown.  RD unable to complete Nutrition Focused Physical Exam at this time.  Diet Order:  Diet NPO time specified Except for: Ice Chips, Sips with Meds  Skin:  Reviewed, no issues  Last BM:  2/2  Height:   Ht Readings from Last 1 Encounters:  06/02/15 5\' 5"  (1.651 m)    Weight:   Wt Readings from Last 1 Encounters:  06/02/15 125 lb 8 oz (56.926 kg)    Ideal Body Weight:  56.8 kg  BMI:  Body mass index is 20.88 kg/(m^2).  Estimated Nutritional Needs:   Kcal:  1200-1400  Protein:  55-65 gm  Fluid:  >/= 1.5 L  EDUCATION NEEDS:   No education needs identified at this time  Arthur Holms, RD, LDN Pager #: [phone] After-Hours Pager #: [phone]

## 2015-06-02 NOTE — Progress Notes (Signed)
  Echocardiogram 2D Echocardiogram has been performed.  Sarah Perkins M 06/02/2015, 3:23 PM

## 2015-06-02 NOTE — Consult Note (Signed)
Reason for Consult:  Duodenal ulcer Referring Physician: Zekiah Coen is an 82 y.o. female.  HPI: This is a 82 yo female with multiple medical issues including dementia, history of diverticulitis, duodenitis, HTN, CHF, GERD who was admitted early this morning for syncope and hematochezia.  She apparently passed out while having a BM.  There was gross blood in the toilet.  Apparently, she had been complaining of some LLQ abdominal pain over the last several days.  She was evaluated in the ED and CT scan showed a probable duodenal ulcer with no pneumoperitoneum.    No family at the bedside DNR  Past Medical History  Diagnosis Date  . CHF (congestive heart failure) ([place])   . Hypertension   . Aneurysm ([place]) 1972    cerebral  . Acid reflux     occasional  . Tremor     worse on right arm  . History of GI diverticular bleed 06/2009    colonoscopy with endo clipping of bleeding tic by Dr Clarene Essex.  Flex sig by Dr Paulita Fujita.  . Acute blood loss anemia 06/2009    received ~ 6 units blood  . Adenomatous polyp of colon 06/2009  . Diverticulitis   . UTI (lower urinary tract infection)   . GI bleed 02/14/2014    Past Surgical History  Procedure Laterality Date  . Hip fracture surgery  2011  . Colonoscopy w/ control of hemorrhage  06/2009  . Knee arthroscopy    . Cerebral aneurysm repair  1970's  . Colonoscopy  09/22/2011    Procedure: COLONOSCOPY;  Surgeon: Juanita Craver, MD;  Location[facility] ENDOSCOPY;  Service: Endoscopy;  Laterality: N/A;  wants ped scope    Family History  Problem Relation Age of Onset  . Stroke Mother   . Lung disease Father   . Stroke Brother     Social History:  reports that she has never smoked. She has never used smokeless tobacco. She reports that she does not drink alcohol or use illicit drugs.  Allergies: No Known Allergies  Medications:  Prior to Admission medications   Medication Sig Start Date End Date Taking? Authorizing Provider  amLODipine  (NORVASC) 5 MG tablet Take 5 mg by mouth daily.   Yes Historical Provider, MD  Ascorbic Acid (VITAMIN C) 100 MG tablet Take 500 mg by mouth daily. With rose hips 500 mg   Yes Historical Provider, MD  Calcium-Magnesium-Vitamin D 185-50-100 MG-MG-UNIT CAPS Take 1 capsule by mouth daily.   Yes Historical Provider, MD  cholecalciferol (VITAMIN D) 1000 UNITS tablet Take 1,000 Units by mouth daily.   Yes Historical Provider, MD  docusate sodium (COLACE) 100 MG capsule Take 100 mg by mouth at bedtime.   Yes Historical Provider, MD  ferrous sulfate 325 (65 FE) MG tablet Take 325 mg by mouth daily with breakfast.   Yes Historical Provider, MD  furosemide (LASIX) 20 MG tablet Take 20 mg by mouth daily.    Yes Historical Provider, MD  guaifenesin (ROBITUSSIN) 100 MG/5ML syrup Take 200 mg by mouth 3 (three) times daily as needed for cough.   Yes Historical Provider, MD  ranitidine (ZANTAC) 150 MG tablet Take 150 mg by mouth 2 (two) times daily as needed for heartburn.   Yes Historical Provider, MD  vitamin B-12 500 MCG tablet Take 1 tablet (500 mcg total) by mouth daily. 03/26/15  Yes Orson Eva, MD     Results for orders placed or performed during the hospital encounter of  06/01/15 (from the past 48 hour(s))  Basic metabolic panel     Status: Abnormal   Collection Time: 06/01/15  9:02 PM  Result Value Ref Range   Sodium 138 135 - 145 mmol/L   Potassium 4.2 3.5 - 5.1 mmol/L   Chloride 98 (L) 101 - 111 mmol/L   CO2 29 22 - 32 mmol/L   Glucose, Bld 151 (H) 65 - 99 mg/dL   BUN 27 (H) 6 - 20 mg/dL   Creatinine, Ser 1.10 (H) 0.44 - 1.00 mg/dL   Calcium 10.1 8.9 - 10.3 mg/dL   GFR calc non Af Amer 41 (L) >60 mL/min   GFR calc Af Amer 48 (L) >60 mL/min    Comment: (NOTE) The eGFR has been calculated using the CKD EPI equation. This calculation has not been validated in all clinical situations. eGFR's persistently <60 mL/min signify possible Chronic Kidney Disease.    Anion gap 11 5 - 15  CBC      Status: Abnormal   Collection Time: 06/01/15  9:02 PM  Result Value Ref Range   WBC 7.8 4.0 - 10.5 K/uL   RBC 3.61 (L) 3.87 - 5.11 MIL/uL   Hemoglobin 10.9 (L) 12.0 - 15.0 g/dL   HCT 33.1 (L) 36.0 - 46.0 %   MCV 91.7 78.0 - 100.0 fL   MCH 30.2 26.0 - 34.0 pg   MCHC 32.9 30.0 - 36.0 g/dL   RDW 14.7 11.5 - 15.5 %   Platelets 269 150 - 400 K/uL  CBG monitoring, ED     Status: Abnormal   Collection Time: 06/01/15  9:06 PM  Result Value Ref Range   Glucose-Capillary 129 (H) 65 - 99 mg/dL  Troponin I     Status: None   Collection Time: 06/01/15  9:24 PM  Result Value Ref Range   Troponin I <0.03 <0.031 ng/mL    Comment:        NO INDICATION OF MYOCARDIAL INJURY.   POC occult blood, ED RN will collect     Status: Abnormal   Collection Time: 06/01/15  9:41 PM  Result Value Ref Range   Fecal Occult Bld POSITIVE (A) NEGATIVE  Urinalysis, Routine w reflex microscopic (not at [facility])     Status: Abnormal   Collection Time: 06/01/15  9:43 PM  Result Value Ref Range   Color, Urine YELLOW YELLOW   APPearance CLOUDY (A) CLEAR   Specific Gravity, Urine 1.017 1.005 - 1.030   pH 7.0 5.0 - 8.0   Glucose, UA NEGATIVE NEGATIVE mg/dL   Hgb urine dipstick NEGATIVE NEGATIVE   Bilirubin Urine NEGATIVE NEGATIVE   Ketones, ur NEGATIVE NEGATIVE mg/dL   Protein, ur 30 (A) NEGATIVE mg/dL   Nitrite NEGATIVE NEGATIVE   Leukocytes, UA TRACE (A) NEGATIVE  Urine microscopic-add on     Status: Abnormal   Collection Time: 06/01/15  9:43 PM  Result Value Ref Range   Squamous Epithelial / LPF 0-5 (A) NONE SEEN   WBC, UA NONE SEEN 0 - 5 WBC/hpf   RBC / HPF NONE SEEN 0 - 5 RBC/hpf   Bacteria, UA FEW (A) NONE SEEN  I-Stat CG4 Lactic Acid, ED     Status: Abnormal   Collection Time: 06/01/15 10:38 PM  Result Value Ref Range   Lactic Acid, Venous 2.60 (HH) 0.5 - 2.0 mmol/L   Comment NOTIFIED PHYSICIAN   CBC     Status: Abnormal   Collection Time: 06/02/15  1:10 AM  Result Value Ref Range  WBC 10.8 (H) 4.0  - 10.5 K/uL   RBC 3.21 (L) 3.87 - 5.11 MIL/uL   Hemoglobin 9.8 (L) 12.0 - 15.0 g/dL   HCT 29.7 (L) 36.0 - 46.0 %   MCV 92.5 78.0 - 100.0 fL   MCH 30.5 26.0 - 34.0 pg   MCHC 33.0 30.0 - 36.0 g/dL   RDW 14.8 11.5 - 15.5 %   Platelets 236 150 - 400 K/uL  Protime-INR     Status: Abnormal   Collection Time: 06/02/15  1:10 AM  Result Value Ref Range   Prothrombin Time 15.9 (H) 11.6 - 15.2 seconds   INR 1.25 0.00 - 1.49  APTT     Status: None   Collection Time: 06/02/15  1:10 AM  Result Value Ref Range   aPTT 29 24 - 37 seconds  Type and screen [place]     Status: None (Preliminary result)   Collection Time: 06/02/15  1:10 AM  Result Value Ref Range   ABO/RH(D) B POS    Antibody Screen NEG    Sample Expiration 06/05/2015    Unit Number [id number]    Blood Component Type RED CELLS,LR    Unit division 00    Status of Unit ALLOCATED    Transfusion Status OK TO TRANSFUSE    Crossmatch Result COMPATIBLE    Unit Number [id number]    Blood Component Type RED CELLS,LR    Unit division 00    Status of Unit ALLOCATED    Transfusion Status OK TO TRANSFUSE    Crossmatch Result COMPATIBLE   Troponin I (q 6hr x 3)     Status: None   Collection Time: 06/02/15  1:10 AM  Result Value Ref Range   Troponin I <0.03 <0.031 ng/mL    Comment:        NO INDICATION OF MYOCARDIAL INJURY.   Lipase, blood     Status: None   Collection Time: 06/02/15  1:10 AM  Result Value Ref Range   Lipase 16 11 - 51 U/L  CK     Status: None   Collection Time: 06/02/15  1:10 AM  Result Value Ref Range   Total CK 60 38 - 234 U/L  Lipid panel     Status: Abnormal   Collection Time: 06/02/15  5:15 AM  Result Value Ref Range   Cholesterol 82 0 - 200 mg/dL   Triglycerides 56 <150 mg/dL   HDL 39 (L) >40 mg/dL   Total CHOL/HDL Ratio 2.1 RATIO   VLDL 11 0 - 40 mg/dL   LDL Cholesterol 32 0 - 99 mg/dL    Comment:        Total Cholesterol/HDL:CHD Risk Coronary Heart Disease Risk Table                      Men   Women  1/2 Average Risk   3.4   3.3  Average Risk       5.0   4.4  2 X Average Risk   9.6   7.1  3 X Average Risk  23.4   11.0        Use the calculated Patient Ratio above and the CHD Risk Table to determine the patient's CHD Risk.        ATP III CLASSIFICATION (LDL):  <100     mg/dL   Optimal  100-129  mg/dL   Near or Above  Optimal  130-159  mg/dL   Borderline  160-189  mg/dL   High  >190     mg/dL   Very High   Brain natriuretic peptide     Status: None   Collection Time: 06/02/15  5:16 AM  Result Value Ref Range   B Natriuretic Peptide 61.4 0.0 - 100.0 pg/mL  CBC     Status: Abnormal   Collection Time: 06/02/15  6:52 AM  Result Value Ref Range   WBC 9.1 4.0 - 10.5 K/uL   RBC 3.03 (L) 3.87 - 5.11 MIL/uL   Hemoglobin 9.2 (L) 12.0 - 15.0 g/dL   HCT 27.7 (L) 36.0 - 46.0 %   MCV 91.4 78.0 - 100.0 fL   MCH 30.4 26.0 - 34.0 pg   MCHC 33.2 30.0 - 36.0 g/dL   RDW 14.9 11.5 - 15.5 %   Platelets 246 150 - 400 K/uL  Troponin I (q 6hr x 3)     Status: None   Collection Time: 06/02/15  6:52 AM  Result Value Ref Range   Troponin I <0.03 <0.031 ng/mL    Comment:        NO INDICATION OF MYOCARDIAL INJURY.     Ct Abdomen Pelvis W Contrast  06/02/2015  CLINICAL DATA:  Syncopal episode during bowel movement tonight. LEFT lower quadrant pain. History of hypertension, aneurysm, diverticulitis, GI bleed, colonic polyps. EXAM: CT ABDOMEN AND PELVIS WITH CONTRAST TECHNIQUE: Multidetector CT imaging of the abdomen and pelvis was performed using the standard protocol following bolus administration of intravenous contrast. CONTRAST:  73m OMNIPAQUE IOHEXOL 300 MG/ML  SOLN COMPARISON:  CT abdomen and pelvis March 22, 2015 FINDINGS: LUNG BASES: Included view of the lung bases are clear. Visualized heart and pericardium are unremarkable. SOLID ORGANS: The liver, spleen, and adrenal glands are unremarkable. Multiple subcentimeter gallstones without CT  findings of acute cholecystitis. Fatty replaced pancreas. GASTROINTESTINAL TRACT: Small fluid-filled apparent ulceration with inflammation duodenum bulb. Moderate hiatal hernia. Proximal duodenum diverticulum. The stomach, and large bowel are normal in course and caliber without inflammatory changes. Severe colonic diverticulosis. Suspected interval appendectomy. KIDNEYS/ URINARY TRACT: Kidneys are orthotopic, demonstrating symmetric enhancement. No nephrolithiasis, hydronephrosis or solid renal masses. 1 cm LEFT lower pole cyst. The unopacified ureters are normal in course and caliber. Delayed imaging through the kidneys demonstrates symmetric prompt contrast excretion within the proximal urinary collecting system. Urinary bladder is partially distended and unremarkable. PERITONEUM/RETROPERITONEUM: Aortoiliac vessels are normal in course and caliber, moderate to severe calcific atherosclerosis. No lymphadenopathy by CT size criteria. Status post hysterectomy. No intraperitoneal free fluid nor free air. SOFT TISSUE/OSSEOUS STRUCTURES: Non-suspicious. RIGHT hip total arthroplasty results in streak artifact. Old L1 moderate to severe compression fracture. Osteopenia. Moderate fat containing umbilical hernia. IMPRESSION: Suspected duodenal ulcer with surrounding inflammatory changes concerning for perforation without pneumoperitoneum. Again recommended is endoscopy. Severe colonic diverticulosis without CT findings of acute diverticulitis. Cholelithiasis without CT findings of acute cholecystitis. Electronically Signed   By: CElon AlasM.D.   On: 06/02/2015 01:09   Dg Chest Portable 1 View  06/01/2015  CLINICAL DATA:  Syncope and weakness EXAM: PORTABLE CHEST 1 VIEW COMPARISON:  10/08/2014 FINDINGS: The heart size and mediastinal contours are within normal limits. Aortic calcifications noted. Small hiatal hernia noted. Both lungs are clear. The visualized skeletal structures are unremarkable. IMPRESSION: No  active disease. Electronically Signed   By: TKerby MoorsM.D.   On: 06/01/2015 21:59   ROS limited by dementia Review of Systems  Unable to  perform ROS  Blood pressure 138/55, pulse 86, temperature 98.1 F (36.7 C), temperature source Oral, resp. rate 15, height '5\' 5"'  (1.651 m), weight 56.926 kg (125 lb 8 oz), SpO2 96 %. Physical Exam WDWN in NAD HEENT:  EOMI, sclera anicteric Neck:  No masses, no thyromegaly Lungs:  CTA bilaterally; normal respiratory effort CV:  Regular rate and rhythm; no murmurs Abd:  +bowel sounds, soft, non-tender, no peritoneal signs Ext:  Well-perfused; no edema Skin:  Warm, dry; no sign of jaundice  Assessment/Plan: Duodenal ulcer - inflamed but no free air Severe diverticulosis Patient is not a good surgical candidate and the family has stated that they do not want any aggressive treatment.  No surgical recommendations Hold anticoagulation PPI Would ask [facility] to see the patient to help manage these issues. We will sign off for now as she is not a candidate for any type of emergent surgical intervention.   Sherae Santino K. 06/02/2015, 9:05 AM

## 2015-06-02 NOTE — H&P (Addendum)
[facility] History and Physical  Sarah Perkins [id number] DOB: [birth year] DOA: 06/01/2015  Referring physician: ED physician PCP: Thressa Sheller, MD  Specialists:   Chief Complaint: syncope, abdominal pain, rectal bleeding  HPI: Sarah Perkins is a 82 y.o. female with PMH of GI bleeding due to diverticular, diverticulitis, dementia, duodenitis, hypertension, diastolic congestive heart failure, GERD, cerebral aneurysm repair 1970, who presents with syncope, abdominal pain and rectal bleeding.  Per pt's son, pt passed out when she was having BM on the toilet at about 8:30 PM. She did no fall from toilet, but was unresponsive. Her son noted some blood mixed with the stool in toilet. Pt complains of abdominal pain over lower abdomen, particularly over LLQ. Son reports that patient had nausea, and mild vomiting intermittently in the past several days. Her son also reported that pt had one episode of difficult speaking on Saturday, but no vision change, hearing loss or unilateral weakness. Patient does not have diarrhea, fever or chills. Per EMS reports, pt has agonal RR (4/min) upon EMS and pt was bagged until regained consciousness. Pt was in afib RVR en route and her CBG was 206. Pt moves all extremities. No chest pain, shortness of breath, symptoms of UTI or unilateral weakness.  In ED, patient was found to have positive FOBT, hemoglobin 9.1 on 03/25/15-->10.9, lactate 2.6, troponin negative, urinalysis with trace amount of leukocytes but no WBC, temperature normal, no tachycardia, slightly worsening renal function, negative chest x-ray. Pending CT abdomen/pelvis. Patient is admitted to inpatient for further evaluation treatment.  EKG: Independently reviewed. QTC 441, occasional PVC, sinus rhythm, poor R-wave progression, which existed in previous EKG on 03/22/15.  Where does patient live?   At home    Can patient participate in ADLs?  None  Review of Systems: Could not be  reviewed accurately due to dementia  Allergy: No Known Allergies  Past Medical History  Diagnosis Date  . CHF (congestive heart failure) ([place])   . Hypertension   . Aneurysm ([place]) 1972    cerebral  . Acid reflux     occasional  . Tremor     worse on right arm  . History of GI diverticular bleed 06/2009    colonoscopy with endo clipping of bleeding tic by Dr Clarene Essex.  Flex sig by Dr Paulita Fujita.  . Acute blood loss anemia 06/2009    received ~ 6 units blood  . Adenomatous polyp of colon 06/2009  . Diverticulitis   . UTI (lower urinary tract infection)   . GI bleed 02/14/2014    Past Surgical History  Procedure Laterality Date  . Hip fracture surgery  2011  . Colonoscopy w/ control of hemorrhage  06/2009  . Knee arthroscopy    . Cerebral aneurysm repair  1970's  . Colonoscopy  09/22/2011    Procedure: COLONOSCOPY;  Surgeon: Juanita Craver, MD;  [facility] ENDOSCOPY;  Service: Endoscopy;  Laterality: N/A;  wants ped scope    Social History:  reports that she has never smoked. She has never used smokeless tobacco. She reports that she does not drink alcohol or use illicit drugs.  Family History:  Family History  Problem Relation Age of Onset  . Stroke Mother   . Lung disease Father   . Stroke Brother      Prior to Admission medications   Medication Sig Start Date End Date Taking? Authorizing Provider  amLODipine (NORVASC) 5 MG tablet Take 5 mg by mouth daily.   Yes Historical Provider, MD  Ascorbic Acid (VITAMIN C) 100 MG tablet Take 500 mg by mouth daily. With rose hips 500 mg   Yes Historical Provider, MD  Calcium-Magnesium-Vitamin D 185-50-100 MG-MG-UNIT CAPS Take 1 capsule by mouth daily.   Yes Historical Provider, MD  cholecalciferol (VITAMIN D) 1000 UNITS tablet Take 1,000 Units by mouth daily.   Yes Historical Provider, MD  docusate sodium (COLACE) 100 MG capsule Take 100 mg by mouth at bedtime.   Yes Historical Provider, MD  ferrous sulfate 325 (65 FE) MG tablet Take 325  mg by mouth daily with breakfast.   Yes Historical Provider, MD  furosemide (LASIX) 20 MG tablet Take 20 mg by mouth daily.    Yes Historical Provider, MD  guaifenesin (ROBITUSSIN) 100 MG/5ML syrup Take 200 mg by mouth 3 (three) times daily as needed for cough.   Yes Historical Provider, MD  ranitidine (ZANTAC) 150 MG tablet Take 150 mg by mouth 2 (two) times daily as needed for heartburn.   Yes Historical Provider, MD  vitamin B-12 500 MCG tablet Take 1 tablet (500 mcg total) by mouth daily. 03/26/15  Yes Orson Eva, MD    Physical Exam: Filed Vitals:   06/01/15 2230 06/01/15 2300 06/01/15 2330 06/02/15 0026  BP: 129/59 142/54 144/62 140/62  Pulse: 86 88 90 100  Temp:      TempSrc:      Resp: 15 17 14 14   Height:      Weight:      SpO2: 94% 94% 95% 95%   General: Not in acute distress HEENT:       Eyes: PERRL, EOMI, no scleral icterus.       ENT: No discharge from the ears and nose, no pharynx injection, no tonsillar enlargement.        Neck: No JVD, no bruit, no mass felt. Heme: No neck lymph node enlargement. Cardiac: S1/S2, RRR with occasional extra beat , No murmurs, No gallops or rubs. Pulm: No rales, wheezing, rhonchi or rubs. Abd: Soft, nondistended, tenderness over lower abdomen, no rebound pain, no organomegaly, BS present. Ext: No pitting leg edema bilaterally. 2+DP/PT pulse bilaterally. Musculoskeletal: No joint deformities, No joint redness or warmth, no limitation of ROM in spin. Skin: No rashes.  Neuro: Alert, knows her son, but not oriented to time and place, cranial nerves II-XII grossly intact, moves all extremities. Psych: Patient is not psychotic, no suicidal or hemocidal ideation.  Labs on Admission:  Basic Metabolic Panel:  Recent Labs Lab 06/01/15 2102  NA 138  K 4.2  CL 98*  CO2 29  GLUCOSE 151*  BUN 27*  CREATININE 1.10*  CALCIUM 10.1   Liver Function Tests: No results for input(s): AST, ALT, ALKPHOS, BILITOT, PROT, ALBUMIN in the last 168  hours. No results for input(s): LIPASE, AMYLASE in the last 168 hours. No results for input(s): AMMONIA in the last 168 hours. CBC:  Recent Labs Lab 06/01/15 2102  WBC 7.8  HGB 10.9*  HCT 33.1*  MCV 91.7  PLT 269   Cardiac Enzymes:  Recent Labs Lab 06/01/15 2124  TROPONINI <0.03    BNP (last 3 results)  Recent Labs  10/08/14 1025  BNP 98.5    ProBNP (last 3 results) No results for input(s): PROBNP in the last 8760 hours.  CBG:  Recent Labs Lab 06/01/15 2106  GLUCAP 129*    Radiological Exams on Admission: Ct Abdomen Pelvis W Contrast  06/02/2015  CLINICAL DATA:  Syncopal episode during bowel movement tonight. LEFT lower quadrant pain.  History of hypertension, aneurysm, diverticulitis, GI bleed, colonic polyps. EXAM: CT ABDOMEN AND PELVIS WITH CONTRAST TECHNIQUE: Multidetector CT imaging of the abdomen and pelvis was performed using the standard protocol following bolus administration of intravenous contrast. CONTRAST:  53mL OMNIPAQUE IOHEXOL 300 MG/ML  SOLN COMPARISON:  CT abdomen and pelvis March 22, 2015 FINDINGS: LUNG BASES: Included view of the lung bases are clear. Visualized heart and pericardium are unremarkable. SOLID ORGANS: The liver, spleen, and adrenal glands are unremarkable. Multiple subcentimeter gallstones without CT findings of acute cholecystitis. Fatty replaced pancreas. GASTROINTESTINAL TRACT: Small fluid-filled apparent ulceration with inflammation duodenum bulb. Moderate hiatal hernia. Proximal duodenum diverticulum. The stomach, and large bowel are normal in course and caliber without inflammatory changes. Severe colonic diverticulosis. Suspected interval appendectomy. KIDNEYS/ URINARY TRACT: Kidneys are orthotopic, demonstrating symmetric enhancement. No nephrolithiasis, hydronephrosis or solid renal masses. 1 cm LEFT lower pole cyst. The unopacified ureters are normal in course and caliber. Delayed imaging through the kidneys demonstrates  symmetric prompt contrast excretion within the proximal urinary collecting system. Urinary bladder is partially distended and unremarkable. PERITONEUM/RETROPERITONEUM: Aortoiliac vessels are normal in course and caliber, moderate to severe calcific atherosclerosis. No lymphadenopathy by CT size criteria. Status post hysterectomy. No intraperitoneal free fluid nor free air. SOFT TISSUE/OSSEOUS STRUCTURES: Non-suspicious. RIGHT hip total arthroplasty results in streak artifact. Old L1 moderate to severe compression fracture. Osteopenia. Moderate fat containing umbilical hernia. IMPRESSION: Suspected duodenal ulcer with surrounding inflammatory changes concerning for perforation without pneumoperitoneum. Again recommended is endoscopy. Severe colonic diverticulosis without CT findings of acute diverticulitis. Cholelithiasis without CT findings of acute cholecystitis. Electronically Signed   By: Elon Alas M.D.   On: 06/02/2015 01:09   Dg Chest Portable 1 View  06/01/2015  CLINICAL DATA:  Syncope and weakness EXAM: PORTABLE CHEST 1 VIEW COMPARISON:  10/08/2014 FINDINGS: The heart size and mediastinal contours are within normal limits. Aortic calcifications noted. Small hiatal hernia noted. Both lungs are clear. The visualized skeletal structures are unremarkable. IMPRESSION: No active disease. Electronically Signed   By: Kerby Moors M.D.   On: 06/01/2015 21:59    Assessment/Plan Principal Problem:   Syncope Active Problems:   Acute on chronic kidney failure (HCC), stage ii   GERD (gastroesophageal reflux disease)   Dementia   GIB (gastrointestinal bleeding)   Chronic diastolic CHF (congestive heart failure) (HCC)   Anemia, iron deficiency   Essential hypertension   Abdominal pain   Elevated lactic acid level  Syncope: Etiology is not clear. Differential diagnosis include TIA/stroke given history of difficulty speaking on Saturday per son's report, cardiac arrhythmia or ACS given A. fib with  RVR per EMS report and vasovagal syncope given the episode happened during bowel movement. I discussed with patient's son about the goal of care. Per her son, no aggressive treatment should be done and tests should be limited, specifically her son refused CT head and carotid artery, but agreed with checking 2-D echo. Her son want pt be observed even if pt has stroke. No blood thinner or antiplatelet agent can be given due to recurrent GI bleeding.  -Will admit to tele bed -trop x 3 -2d echo -NPO until SLP done -ortho static vital signs -IVF: 1L of NS in ED, then 75 cc/h -pt/ot  Questionable atrial fibrillation with RVR: This is reported by EMS, however EKG in ED showed sinus rhythm with occasional PAC. Patient is not a candidate for blood thinner due to GI bleeding. -Repeat EKG in the morning -Follow-up troponin 3 and 2-D echo.  Abdominal pain: Etiology is not clear. It is likely caused by duodentiis. She had CT abdomen and pelvis 03/22/2015, which showed basilar blebs with question of thickening of the duodenal bulb, however the images were limited by the patient's movement. - IV Protonix bid and oral pepcid -Avoid NSAIDs -f/u CT-abd/pelvis which is ordered by EDP -check lipase  Addendum: CT-abd/pelvis showed possible duodenal ulcer with concerning for perforation without pneumoperitoneum--> general surgeon, Dr. Grandville Silos was consulte  GI bleed: FOBT positive. Hgh stable 9.1-->10.9. Hemodynamically stable. 09/22/2011 colonoscopy showed pandiverticulosis - NPO - Start IV pantoprazole 40 mg bib - Zofran IV for nausea - Avoid NSAIDs and SQ heparin - Maintain IV access (2 large bore IVs if possible). - Monitor closely and follow q6h cbc, transfuse as necessary.  GERD: -Protonix IV -Pepcid   Elevated lactic acid level : likely due to combination of syncope and worsening renal Fx. Patient does not have signs of infection, unlikely to have sepsis. -IVFas above -trend lactate  99991111  Chronic diastolic CHF: 2-D echo 99991111 showed EF 60-65% with grade 2 diastolic dysfunction. Patient is on Lasix 20 mg daily at home. No leg edema. CHF is compensated. -Hold lasix due to worsening renal function -check BNP  Anemia due to Iron deficiency and blood loss for GBI: -continue iron supplement -Follow-up by CBC  Dementia:  -f/u SLP -PT/OT  Hypertension   -Continue amlodipine  -hold lasix due to worsening renal function -IV hydralazine when necessary   AoCKD-II: Baseline creatinine 0.6-0.9. Her cre is 1.01  and BUN 27 on admission. Likely due to prerenal secondary to dehydration and continuation of diuretics. - IVF as above - Follow up renal function by BMP - Hold Diuretics   DVT ppx: SCD  Code Status: DNR Family Communication:  Yes, patient's son  at bed side Disposition Plan: Admit to inpatient   Date of Service 06/02/2015    Ivor Costa [facility] Pager [phone]  If 7PM-7AM, please contact night-coverage [email or web address] Password [email or web address] 06/02/2015, 1:43 AM

## 2015-06-02 NOTE — Consult Note (Signed)
[facility] Consult: 9:51 AM 06/02/2015  LOS: 0 days    Referring Provider: Dr Tyrell Antonio.  Primary Care Physician:  Thressa Sheller, MD Primary Gastroenterologist:  Several [place] practices, unassigned.     Reason for Consultation:  Perforated uodenal ulcer.    HPI: Sarah Perkins is a 82 y.o. female.  Hx Cerebral aneurysm repair 1970, dementia, IDA and blood loss anemias, iron studies normal in 02/2015. CKD stage 3. EF 60 to 123456, grade 2 diastolic dysfunction, Aortic valve calcification but trivial regurge on 02/2015 echo.  FTT in 02/2015.  s/p right hip replacement. S/p hysto.   Hx of iron deficiency and ABL  anemia, adenomatous colon polyp and of recurrent diverticular bleeds, gallstones, elevated LFTs in 01/2013.  Small HH per cxr and CT but no EGDs in records. Serum H pylori Ab testing wnl 02/2015. Duodenitis by CT in 02/2015 treated with 4 weeks BID PPI, followed by daily PPI.  However her current GI med is Ranitidine 150 BID PRN  Pt advised to stop her previous HS Ibuprofen.  PO iron restarted 02/2015.  SLP recommended D3/thins in 02/2015.  03/22/15 CT abdomen: thickened duodenal bulb but study compromised by pt movement.  08/2011 Colonoscopy for IDA.  Dr Collene Mares: rectal bleeding and hx 2011 adenoma: Pan diverticulosis. 07/2009 Colonoscopy for hematochezia.  Dr Watt Climes: Small ascending polyp (adenoma), scattered tics one of which was oozing blood and was epi'd and endoclipped. 07/2009 Flex sig for ongoing bleeding.  Old and fresh blood seen, clips in place, no active bleeding.   07/2009, 12/203, 01/2014 mesenteric angiography: all negative, no bleeding source identified.  07/2009, 08/2011, 03/2012, 01/2014 nuc med RBC scans.  Positive in 08/2011, 03/2012, 01/2012.  01/2013 HIDA scan. Delayed liver uptake, absence  biliary excretion, all c/w hepatocellular disease.  01/2013 ultrasound: 3.5 mm CBD, cholelithiasis, normal GB wall, normal liver.     Admitted last night from ED. Nausea and intermittent vomiting for several days.  Denies abdominal pain or significant heartburn.  Appetite fair.  Hungry today.  Stools "yellow".  No NSAIDs or ASA.  CT scan 06/02/15: duodenal bulb inflammation/likely ulcer with changes concerning for perforation but no free air.  Duodenal diverticulum.  HH.  Severe colon tics.  Cholelithiasis.  Hgb 10.9, 9.2 following hydration. Baseline looks to be ~ 9.     Dr Georgette Dover, surgeon, recommends medical mgt.  Pt does not need surgery at present and poor op candidate anyway.    Past Medical History  Diagnosis Date  . CHF (congestive heart failure) ([place])   . Hypertension   . Aneurysm ([place]) 1972    cerebral  . Acid reflux     occasional  . Tremor     worse on right arm  . History of GI diverticular bleed 06/2009    colonoscopy with endo clipping of bleeding tic by Dr Clarene Essex.  Flex sig by Dr Paulita Fujita.  . Acute blood loss anemia 06/2009    received ~ 6 units blood  . Adenomatous polyp of colon 06/2009  . Diverticulitis   . UTI (lower urinary  tract infection)   . GI bleed 02/14/2014    Past Surgical History  Procedure Laterality Date  . Hip fracture surgery  2011  . Colonoscopy w/ control of hemorrhage  06/2009  . Knee arthroscopy    . Cerebral aneurysm repair  1970's  . Colonoscopy  09/22/2011    Procedure: COLONOSCOPY;  Surgeon: Juanita Craver, MD;  Location: [facility] ENDOSCOPY;  Service: Endoscopy;  Laterality: N/A;  wants ped scope    Prior to Admission medications   Medication Sig Start Date End Date Taking? Authorizing Provider  amLODipine (NORVASC) 5 MG tablet Take 5 mg by mouth daily.   Yes Historical Provider, MD  Ascorbic Acid (VITAMIN C) 100 MG tablet Take 500 mg by mouth daily. With rose hips 500 mg   Yes Historical Provider, MD  Calcium-Magnesium-Vitamin D 185-50-100  MG-MG-UNIT CAPS Take 1 capsule by mouth daily.   Yes Historical Provider, MD  cholecalciferol (VITAMIN D) 1000 UNITS tablet Take 1,000 Units by mouth daily.   Yes Historical Provider, MD  docusate sodium (COLACE) 100 MG capsule Take 100 mg by mouth at bedtime.   Yes Historical Provider, MD  ferrous sulfate 325 (65 FE) MG tablet Take 325 mg by mouth daily with breakfast.   Yes Historical Provider, MD  furosemide (LASIX) 20 MG tablet Take 20 mg by mouth daily.    Yes Historical Provider, MD  guaifenesin (ROBITUSSIN) 100 MG/5ML syrup Take 200 mg by mouth 3 (three) times daily as needed for cough.   Yes Historical Provider, MD  ranitidine (ZANTAC) 150 MG tablet Take 150 mg by mouth 2 (two) times daily as needed for heartburn.   Yes Historical Provider, MD  vitamin B-12 500 MCG tablet Take 1 tablet (500 mcg total) by mouth daily. 03/26/15  Yes Orson Eva, MD    Scheduled Meds: .  stroke: mapping our early stages of recovery book   Does not apply Once  . pantoprazole (PROTONIX) IV  80 mg Intravenous Once  . [START ON 06/05/2015] pantoprazole (PROTONIX) IV  40 mg Intravenous Q12H   Infusions: . sodium chloride 75 mL/hr at 06/02/15 0243  . pantoprozole (PROTONIX) infusion     PRN Meds: docusate sodium, guaifenesin, hydrALAZINE, morphine injection, ondansetron   Allergies as of 06/01/2015  . (No Known Allergies)    Family History  Problem Relation Age of Onset  . Stroke Mother   . Lung disease Father   . Stroke Brother     Social History   Social History  . Marital Status: Divorced    Spouse Name: N/A  . Number of Children: N/A  . Years of Education: N/A   Occupational History  . Not on file.   Social History Main Topics  . Smoking status: Never Smoker   . Smokeless tobacco: Never Used  . Alcohol Use: No  . Drug Use: No  . Sexual Activity: No   Other Topics Concern  . Not on file   Social History Narrative    REVIEW OF SYSTEMS: Constitutional:  Says she still cooks.   Does not leave house. ENT:  No nose bleeds Pulm:  No SOB or cough CV:  No palpitations, no LE edema. No chest pain.  GU:  No hematuria, no frequency GI:  Per HPI.  No dysphagia Heme:  No hematochezia since fall 2016.    Transfusions:  Several times.  02/2015, 10/2014, 01/2014, 04/2012, 08/2011,  Neuro:  No headaches, no peripheral tingling or numbness Derm:  No itching, no rash or sores.  Endocrine:  No sweats or chills.  No polyuria or dysuria Immunization:  Reviewed.  Travel:  None    PHYSICAL EXAM: Vital signs in last 24 hours: Filed Vitals:   06/02/15 0400 06/02/15 0647  BP: 120/54 138/55  Pulse:  86  Temp:  98.1 F (36.7 C)  Resp:     Wt Readings from Last 3 Encounters:  06/02/15 56.926 kg (125 lb 8 oz)  03/24/15 61.8 kg (136 lb 3.9 oz)  10/11/14 60.147 kg (132 lb 9.6 oz)    General: pleasant, alert, comfortable aged WF.  Frail but not acutely ill looking Head:  No asymmetry or swelling.   Eyes:  No icterus, erythematous conjunctiva Ears:  Moderate HOH, no hearing aid in place  Nose:  No congestion or discharge Mouth:  Dry, clear oral MM.  Moderately poor dentition.  Neck:  No mass, no JVD, no bruits, no TMG Lungs:  dimished BS globally (poor effort) some dry rales in left > right base.  No cough, no labored resps Heart: RRR.  No MRG.  S1/S2 audible Abdomen:  Soft, thin, small umbilical hernia.  No mass, bruits, organomegaly.  Not tender.  BS hypoactive but no tinkling/tympanitic sounds..   Rectal: deferred   Musc/Skeltl: osteopenic/osteoporotic looking.  Mild spine Kyphosis.  Extremities:  Non-pitting bil pedal edema.  Feet warm, brisk cap refill.    Neurologic:  Oriented to place, self, president.  Year: "46" Skin:  Thin, no worrisome sores or lesions.  No telangectasias.  Tattoos:  none Nodes:  No cervical adenopathy.    Psych:  Cooperative, calm, affect a bit blunted but not obviously depressed.   Intake/Output from previous day: 02/02 0701 - 02/03 0700 In:  1000 [I.V.:1000] Out: 1 [Emesis/NG output:1] Intake/Output this shift:    LAB RESULTS:  Recent Labs  06/01/15 2102 06/02/15 0110 06/02/15 0652  WBC 7.8 10.8* 9.1  HGB 10.9* 9.8* 9.2*  HCT 33.1* 29.7* 27.7*  PLT 269 236 246  MCV     91   BMET Lab Results  Component Value Date   NA 138 06/01/2015   NA 141 03/25/2015   NA 143 03/24/2015   K 4.2 06/01/2015   K 3.6 03/25/2015   K 3.6 03/24/2015   CL 98* 06/01/2015   CL 113* 03/25/2015   CL 115* 03/24/2015   CO2 29 06/01/2015   CO2 24 03/25/2015   CO2 25 03/24/2015   GLUCOSE 151* 06/01/2015   GLUCOSE 100* 03/25/2015   GLUCOSE 97 03/24/2015   BUN 27* 06/01/2015   BUN 18 03/25/2015   BUN 21* 03/24/2015   CREATININE 1.10* 06/01/2015   CREATININE 0.69 03/25/2015   CREATININE 0.85 03/24/2015   CALCIUM 10.1 06/01/2015   CALCIUM 8.3* 03/25/2015   CALCIUM 8.1* 03/24/2015   LFT No results for input(s): PROT, ALBUMIN, AST, ALT, ALKPHOS, BILITOT, BILIDIR, IBILI in the last 72 hours. PT/INR Lab Results  Component Value Date   INR 1.25 06/02/2015   INR 1.32 01/29/2013   INR 1.18 04/19/2012   Hepatitis Panel No results for input(s): HEPBSAG, HCVAB, HEPAIGM, HEPBIGM in the last 72 hours. C-Diff No components found for: CDIFF Lipase     Component Value Date/Time   LIPASE 16 06/02/2015 0110    Drugs of Abuse  No results found for: LABOPIA, COCAINSCRNUR, LABBENZ, AMPHETMU, THCU, LABBARB   RADIOLOGY STUDIES: Ct Abdomen Pelvis W Contrast  06/02/2015  CLINICAL DATA:  Syncopal episode during bowel movement tonight. LEFT lower quadrant pain. History of hypertension, aneurysm, diverticulitis, GI  bleed, colonic polyps. EXAM: CT ABDOMEN AND PELVIS WITH CONTRAST TECHNIQUE: Multidetector CT imaging of the abdomen and pelvis was performed using the standard protocol following bolus administration of intravenous contrast. CONTRAST:  32mL OMNIPAQUE IOHEXOL 300 MG/ML  SOLN COMPARISON:  CT abdomen and pelvis March 22, 2015 FINDINGS:  LUNG BASES: Included view of the lung bases are clear. Visualized heart and pericardium are unremarkable. SOLID ORGANS: The liver, spleen, and adrenal glands are unremarkable. Multiple subcentimeter gallstones without CT findings of acute cholecystitis. Fatty replaced pancreas. GASTROINTESTINAL TRACT: Small fluid-filled apparent ulceration with inflammation duodenum bulb. Moderate hiatal hernia. Proximal duodenum diverticulum. The stomach, and large bowel are normal in course and caliber without inflammatory changes. Severe colonic diverticulosis. Suspected interval appendectomy. KIDNEYS/ URINARY TRACT: Kidneys are orthotopic, demonstrating symmetric enhancement. No nephrolithiasis, hydronephrosis or solid renal masses. 1 cm LEFT lower pole cyst. The unopacified ureters are normal in course and caliber. Delayed imaging through the kidneys demonstrates symmetric prompt contrast excretion within the proximal urinary collecting system. Urinary bladder is partially distended and unremarkable. PERITONEUM/RETROPERITONEUM: Aortoiliac vessels are normal in course and caliber, moderate to severe calcific atherosclerosis. No lymphadenopathy by CT size criteria. Status post hysterectomy. No intraperitoneal free fluid nor free air. SOFT TISSUE/OSSEOUS STRUCTURES: Non-suspicious. RIGHT hip total arthroplasty results in streak artifact. Old L1 moderate to severe compression fracture. Osteopenia. Moderate fat containing umbilical hernia. IMPRESSION: Suspected duodenal ulcer with surrounding inflammatory changes concerning for perforation without pneumoperitoneum. Again recommended is endoscopy. Severe colonic diverticulosis without CT findings of acute diverticulitis. Cholelithiasis without CT findings of acute cholecystitis. Electronically Signed   By: Elon Alas M.D.   On: 06/02/2015 01:09   Dg Chest Portable 1 View  06/01/2015  CLINICAL DATA:  Syncope and weakness EXAM: PORTABLE CHEST 1 VIEW COMPARISON:  10/08/2014  FINDINGS: The heart size and mediastinal contours are within normal limits. Aortic calcifications noted. Small hiatal hernia noted. Both lungs are clear. The visualized skeletal structures are unremarkable. IMPRESSION: No active disease. Electronically Signed   By: Kerby Moors M.D.   On: 06/01/2015 21:59    ENDOSCOPIC STUDIES: Per HPI  IMPRESSION:   *  Apparent duodenal ulcer with apparent walled off perf.  Duodenitis on CT in 02/2015, was to stay on PPI, but recent home med was prn Ranitidine, so not sure she was getting any acid controlling/ulcer meds since discharge 02/2015.   *  Dementia.   *  Hx recurrent diverticular bleeds.  Multiple colonoscopies, nuc RBC studies, angiograms.  2011 adenomatous colon polyp, no recurrence on 2013 colonoscopy.   *  Hx Iron def anemia.  Hx ABL/transfusion requiring anemia.  Currently normocytic.  On po Iron at home.  Though she says she has yellow stools which makes me wonder if she is actually taking the po Iron.  Iron studies normal in 02/2015.     PLAN:     *  EGD next week, Tuesday with Dr Silverio Decamp.  Allow time and Protonix to heal ulcer.  If scoped now, run risk of further perforation.  BID PPI is fine, so stop gtt.  Note diet is advanced to D2/thins, clinically looks like this will be fine.  CBC in AM, no need of q 8 hour CBC.    Azucena Freed  06/02/2015, 9:51 AM Pager: [phone]     ________________________________________________________________________  Velora Heckler GI MD note:  I personally examined the patient, reviewed the data and agree with the assessment and plan described above.  Abnormal duodenum, periduodenum. This may be duodenal ulcer with 'walled  off perf', may be duodenal diverituclitis however that his pretty uncommon.  I am reluctant to proceed with EGD with ?perf right now.  She should stay on IV PPI for next several days.  I'm also going to add broad spectrum abx.  Fortunately she is not very tender at all and is eating  solids without symptoms.     Owens Loffler, MD [facility] Pager [phone]

## 2015-06-02 NOTE — Progress Notes (Signed)
Utilization review completed.  

## 2015-06-02 NOTE — Progress Notes (Signed)
[facility] PROGRESS NOTE  Sarah Perkins [id number] DOB: 1918/02/03 DOA: 06/01/2015 PCP: Thressa Sheller, MD  Assessment/Plan: HPI: Sarah Perkins is a 82 y.o. female with PMH of GI bleeding due to diverticular, diverticulitis, dementia, duodenitis, hypertension, diastolic congestive heart failure, GERD, cerebral aneurysm repair 1970, who presents with syncope, abdominal pain and rectal bleeding.  Syncope: suspect vasovagal.  -trop x 3 negative -2d echo pending.   Questionable atrial fibrillation with RVR: This is reported by EMS, however EKG in ED showed sinus rhythm with occasional PAC. Patient is not a candidate for blood thinner due to GI bleeding. -Repeat EKG in the morning -Follow-up troponin 3 and 2-D echo.  Abdominal pain: CT showed duodenal ulcer with inflammation. ? Perforation  - IV Protonix gtt -Avoid NSAIDs -lipase negative.  -GI and surgery consulted.   GI bleed: FOBT positive. Hgh stable 9.1-->10.9. Hemodynamically stable. 09/22/2011 colonoscopy showed pandiverticulosis - NPO - Protonix Gtt - Zofran IV for nausea - Avoid NSAIDs and SQ heparin  GERD: -Protonix IV -Pepcid   Elevated lactic acid level : likely due to combination of syncope and worsening renal Fx. Patient does not have signs of infection, unlikely to have sepsis. -IVFas above -trend lactate 99991111  Chronic diastolic CHF: 2-D echo 99991111 showed EF 60-65% with grade 2 diastolic dysfunction. Patient is on Lasix 20 mg daily at home. No leg edema. CHF is compensated. -Hold lasix due to worsening renal function -check BNP  Anemia due to Iron deficiency and blood loss for GBI: -continue iron supplement -Follow-up by CBC GI consulted.  Hb stable.   Dementia:  -f/u SLP -PT/OT  Hypertension   -hold amlodipine  -hold lasix due to worsening renal function -IV hydralazine when necessary   AoCKD-II: Baseline creatinine 0.6-0.9. Her cre is 1.01 and BUN 27 on admission.  Likely due to prerenal secondary to dehydration and continuation of diuretics. - IVF as above - Follow up renal function by BMP - Hold Diuretics  Code Status: DNR Family Communication: none at bedside Disposition Plan: remain inpatient for tx ulcer.    Consultants:  Surgery   GI  Procedures:  ECHO; pending.   Antibiotics:  HPI/Subjective: Denies abdominal pain. Alert, pleasantly confuse.   Objective: Filed Vitals:   06/02/15 0400 06/02/15 0647  BP: 120/54 138/55  Pulse:  86  Temp:  98.1 F (36.7 C)  Resp:      Intake/Output Summary (Last 24 hours) at 06/02/15 0826 Last data filed at 06/01/15 2247  Gross per 24 hour  Intake   1000 ml  Output      1 ml  Net    999 ml   Filed Weights   06/01/15 2050 06/02/15 0128  Weight: 58.968 kg (130 lb) 56.926 kg (125 lb 8 oz)    Exam:   General:  NAD  Cardiovascular: S 1, S 2 RRR  Respiratory: CTA  Abdomen: bs present, soft, nt  Musculoskeletal: no edema  Data Reviewed: Basic Metabolic Panel:  Recent Labs Lab 06/01/15 2102  NA 138  K 4.2  CL 98*  CO2 29  GLUCOSE 151*  BUN 27*  CREATININE 1.10*  CALCIUM 10.1   Liver Function Tests: No results for input(s): AST, ALT, ALKPHOS, BILITOT, PROT, ALBUMIN in the last 168 hours.  Recent Labs Lab 06/02/15 0110  LIPASE 16   No results for input(s): AMMONIA in the last 168 hours. CBC:  Recent Labs Lab 06/01/15 2102 06/02/15 0110 06/02/15 0652  WBC 7.8 10.8* 9.1  HGB 10.9* 9.8* 9.2*  HCT 33.1* 29.7* 27.7*  MCV 91.7 92.5 91.4  PLT 269 236 246   Cardiac Enzymes:  Recent Labs Lab 06/01/15 2124 06/02/15 0110 06/02/15 0652  CKTOTAL  --  60  --   TROPONINI <0.03 <0.03 <0.03   BNP (last 3 results)  Recent Labs  10/08/14 1025 06/02/15 0516  BNP 98.5 61.4    ProBNP (last 3 results) No results for input(s): PROBNP in the last 8760 hours.  CBG:  Recent Labs Lab 06/01/15 2106  GLUCAP 129*    No results found for this or any  previous visit (from the past 240 hour(s)).   Studies: Ct Abdomen Pelvis W Contrast  06/02/2015  CLINICAL DATA:  Syncopal episode during bowel movement tonight. LEFT lower quadrant pain. History of hypertension, aneurysm, diverticulitis, GI bleed, colonic polyps. EXAM: CT ABDOMEN AND PELVIS WITH CONTRAST TECHNIQUE: Multidetector CT imaging of the abdomen and pelvis was performed using the standard protocol following bolus administration of intravenous contrast. CONTRAST:  50mL OMNIPAQUE IOHEXOL 300 MG/ML  SOLN COMPARISON:  CT abdomen and pelvis March 22, 2015 FINDINGS: LUNG BASES: Included view of the lung bases are clear. Visualized heart and pericardium are unremarkable. SOLID ORGANS: The liver, spleen, and adrenal glands are unremarkable. Multiple subcentimeter gallstones without CT findings of acute cholecystitis. Fatty replaced pancreas. GASTROINTESTINAL TRACT: Small fluid-filled apparent ulceration with inflammation duodenum bulb. Moderate hiatal hernia. Proximal duodenum diverticulum. The stomach, and large bowel are normal in course and caliber without inflammatory changes. Severe colonic diverticulosis. Suspected interval appendectomy. KIDNEYS/ URINARY TRACT: Kidneys are orthotopic, demonstrating symmetric enhancement. No nephrolithiasis, hydronephrosis or solid renal masses. 1 cm LEFT lower pole cyst. The unopacified ureters are normal in course and caliber. Delayed imaging through the kidneys demonstrates symmetric prompt contrast excretion within the proximal urinary collecting system. Urinary bladder is partially distended and unremarkable. PERITONEUM/RETROPERITONEUM: Aortoiliac vessels are normal in course and caliber, moderate to severe calcific atherosclerosis. No lymphadenopathy by CT size criteria. Status post hysterectomy. No intraperitoneal free fluid nor free air. SOFT TISSUE/OSSEOUS STRUCTURES: Non-suspicious. RIGHT hip total arthroplasty results in streak artifact. Old L1 moderate to  severe compression fracture. Osteopenia. Moderate fat containing umbilical hernia. IMPRESSION: Suspected duodenal ulcer with surrounding inflammatory changes concerning for perforation without pneumoperitoneum. Again recommended is endoscopy. Severe colonic diverticulosis without CT findings of acute diverticulitis. Cholelithiasis without CT findings of acute cholecystitis. Electronically Signed   By: Elon Alas M.D.   On: 06/02/2015 01:09   Dg Chest Portable 1 View  06/01/2015  CLINICAL DATA:  Syncope and weakness EXAM: PORTABLE CHEST 1 VIEW COMPARISON:  10/08/2014 FINDINGS: The heart size and mediastinal contours are within normal limits. Aortic calcifications noted. Small hiatal hernia noted. Both lungs are clear. The visualized skeletal structures are unremarkable. IMPRESSION: No active disease. Electronically Signed   By: Kerby Moors M.D.   On: 06/01/2015 21:59    Scheduled Meds: .  stroke: mapping our early stages of recovery book   Does not apply Once  . amLODipine  5 mg Oral Daily  . calcium-vitamin D  1 tablet Oral Q breakfast  . cholecalciferol  1,000 Units Oral Daily  . famotidine  20 mg Oral Daily  . ferrous sulfate  325 mg Oral Q breakfast  . pantoprazole (PROTONIX) IV  80 mg Intravenous Once  . [START ON 06/05/2015] pantoprazole (PROTONIX) IV  40 mg Intravenous Q12H  . vitamin B-12  500 mcg Oral Daily  . vitamin C  500 mg Oral Daily   Continuous Infusions: . sodium chloride  75 mL/hr at 06/02/15 0243  . pantoprozole (PROTONIX) infusion      Principal Problem:   Syncope Active Problems:   Acute on chronic kidney failure (HCC), stage ii   GERD (gastroesophageal reflux disease)   Dementia   GIB (gastrointestinal bleeding)   Chronic diastolic CHF (congestive heart failure) (HCC)   Anemia, iron deficiency   Essential hypertension   Abdominal pain   Elevated lactic acid level    Time spent: 35 minutes.     Niel Hummer A  [facility] Pager  [phone]. If 7PM-7AM, please contact night-coverage at [email or web address], password [facility] 06/02/2015, 8:26 AM  LOS: 0 days

## 2015-06-02 NOTE — Progress Notes (Signed)
Pt refused to have her nose swab for MRSA screening. Would attempt again later during the day.  Ferdinand Lango, RN

## 2015-06-02 NOTE — Evaluation (Signed)
Clinical/Bedside Swallow Evaluation Patient Details  Name: Sarah Perkins MRN: [id number] Date of Birth: 04-28-1918  Today's Date: 06/02/2015 Time: SLP Start Time (ACUTE ONLY): 0945 SLP Stop Time (ACUTE ONLY): 1002 SLP Time Calculation (min) (ACUTE ONLY): 17 min  Past Medical History:  Past Medical History  Diagnosis Date  . CHF (congestive heart failure) ([place])   . Hypertension   . Aneurysm ([place]) 1972    cerebral  . Acid reflux     occasional  . Tremor     worse on right arm  . History of GI diverticular bleed 06/2009    colonoscopy with endo clipping of bleeding tic by Dr Clarene Essex.  Flex sig by Dr Paulita Fujita.  . Acute blood loss anemia 06/2009    received ~ 6 units blood  . Adenomatous polyp of colon 06/2009  . Diverticulitis   . UTI (lower urinary tract infection)   . GI bleed 02/14/2014   Past Surgical History:  Past Surgical History  Procedure Laterality Date  . Hip fracture surgery  2011  . Colonoscopy w/ control of hemorrhage  06/2009  . Knee arthroscopy    . Cerebral aneurysm repair  1970's  . Colonoscopy  09/22/2011    Procedure: COLONOSCOPY;  Surgeon: Juanita Craver, MD;  Location: [facility] ENDOSCOPY;  Service: Endoscopy;  Laterality: N/A;  wants ped scope   HPI:  82 y.o. female with h/o GI bleeding due to diverticular, diverticulitis, dementia, duodenitis, hypertension, diastolic congestive heart failure, GERD, cerebral aneurysm repair 1970, acid reflux, tremor, who presented upon admission with syncope, abdominal pain and rectal bleeding. Per MD note, son repoorted pt had one episode of difficulty speaking on Saturday. CXR 2/2 no active disease. Previous bedside swallow evaluation dated November 2016.   Assessment / Plan / Recommendation Clinical Impression  SLP provided skilled observation of POs at bedside to assess swallow function. Son reported pt normally eats fine chopped and softer foods and denied s/s aspiration. Multiple swallows were observed during intake of thin  liquids indicative of possible pharyngeal residue. Impaired mastication due to missing dentition and reduced lingual control led to oral residue during intake of solids (unsensed but cleared with verbal cues). Swallow deemed within functional limits due to advanced age, as no overt s/s of penetration/aspiration were observed throughout the session. Pt and son educated re: diet recommendation. Recommend dysphagia 2 diet with thin liquids, meds whole in puree. SLP will f/u to determine diet toleration.    Aspiration Risk  Mild aspiration risk    Diet Recommendation Dysphagia 2 (Fine chop);Thin liquid   Liquid Administration via: Cup;Straw Medication Administration: Whole meds with puree Supervision: Patient able to self feed;Staff to assist with self feeding Compensations: Minimize environmental distractions;Small sips/bites;Slow rate Postural Changes: Seated upright at 90 degrees    Other  Recommendations Oral Care Recommendations: Oral care BID   Follow up Recommendations  None    Frequency and Duration min 1 x/week  1 week       Prognosis Prognosis for Safe Diet Advancement: Good Barriers to Reach Goals: Cognitive deficits      Swallow Study   General HPI: 82 y.o. female with h/o GI bleeding due to diverticular, diverticulitis, dementia, duodenitis, hypertension, diastolic congestive heart failure, GERD, cerebral aneurysm repair 1970, acid reflux, tremor, who presented upon admission with syncope, abdominal pain and rectal bleeding. Per MD note, son repoorted pt had one episode of difficulty speaking on Saturday. CXR 2/2 no active disease. Previous bedside swallow evaluation dated November 2016. Type of Study:  Bedside Swallow Evaluation Previous Swallow Assessment: bedside November 2016, was on dysphagia 3 diet with thin liquids Diet Prior to this Study: NPO Temperature Spikes Noted: No Respiratory Status: Room air History of Recent Intubation: No Behavior/Cognition:  Alert;Cooperative;Pleasant mood Oral Cavity Assessment: Dry Oral Care Completed by SLP: Yes Oral Cavity - Dentition: Missing dentition;Poor condition (6-7 natural teeth) Vision: Functional for self-feeding Self-Feeding Abilities: Able to feed self;Needs assist;Needs set up Patient Positioning: Upright in bed Baseline Vocal Quality: Normal Volitional Cough: Weak Volitional Swallow: Able to elicit    Oral/Motor/Sensory Function Overall Oral Motor/Sensory Function: Mild impairment Facial ROM: Within Functional Limits Facial Symmetry: Within Functional Limits Facial Strength: Within Functional Limits Facial Sensation: Within Functional Limits Lingual ROM: Reduced right;Reduced left Lingual Symmetry: Within Functional Limits Lingual Strength: Within Functional Limits Lingual Sensation: Within Functional Limits Velum: Within Functional Limits Mandible: Within Functional Limits   Ice Chips Ice chips: Not tested   Thin Liquid Thin Liquid: Impaired Presentation: Cup;Self Fed;Straw Pharyngeal  Phase Impairments: Suspected delayed Swallow;Multiple swallows    Nectar Thick Nectar Thick Liquid: Not tested   Honey Thick Honey Thick Liquid: Not tested   Puree Puree: Within functional limits   Solid   GO   Solid: Impaired Presentation: Self Fed Oral Phase Impairments: Impaired mastication Oral Phase Functional Implications: Oral residue;Impaired mastication;Prolonged oral transit        Kiowa Hollar 06/02/2015,10:25 AM   Titus Mould, Student-SLP

## 2015-06-02 NOTE — ED Notes (Signed)
Pt to CT

## 2015-06-03 LAB — HEMOGLOBIN A1C
HEMOGLOBIN A1C: 6 % — AB (ref 4.8–5.6)
MEAN PLASMA GLUCOSE: 126 mg/dL

## 2015-06-03 LAB — CBC
HCT: 24.8 % — ABNORMAL LOW (ref 36.0–46.0)
Hemoglobin: 8.1 g/dL — ABNORMAL LOW (ref 12.0–15.0)
MCH: 30 pg (ref 26.0–34.0)
MCHC: 32.7 g/dL (ref 30.0–36.0)
MCV: 91.9 fL (ref 78.0–100.0)
PLATELETS: 226 10*3/uL (ref 150–400)
RBC: 2.7 MIL/uL — ABNORMAL LOW (ref 3.87–5.11)
RDW: 15.3 % (ref 11.5–15.5)
WBC: 7.2 10*3/uL (ref 4.0–10.5)

## 2015-06-03 LAB — BASIC METABOLIC PANEL
Anion gap: 8 (ref 5–15)
BUN: 23 mg/dL — AB (ref 6–20)
CHLORIDE: 109 mmol/L (ref 101–111)
CO2: 25 mmol/L (ref 22–32)
CREATININE: 0.8 mg/dL (ref 0.44–1.00)
Calcium: 8.7 mg/dL — ABNORMAL LOW (ref 8.9–10.3)
GFR calc Af Amer: >60 mL/min (ref >60)
GFR calc non Af Amer: >60 mL/min (ref >60)
Glucose, Bld: 86 mg/dL (ref 65–99)
Potassium: 3.4 mmol/L — ABNORMAL LOW (ref 3.5–5.1)
SODIUM: 142 mmol/L (ref 135–145)

## 2015-06-03 NOTE — Progress Notes (Signed)
[facility] PROGRESS NOTE  Sarah Perkins [id number] DOB: Oct 19, 1917 DOA: 06/01/2015 PCP: Thressa Sheller, MD  Assessment/Plan: HPI: Sarah Perkins is a 82 y.o. female with PMH of GI bleeding due to diverticular, diverticulitis, dementia, duodenitis, hypertension, diastolic congestive heart failure, GERD, cerebral aneurysm repair 1970, who presents with syncope, abdominal pain and rectal bleeding.  Syncope: suspect vasovagal.  -trop x 3 negative -2d echo; normal EF, mild diastolic dysfunction   Questionable atrial fibrillation with RVR: This is reported by EMS, however EKG in ED showed sinus rhythm with occasional PAC. Patient is not a candidate for blood thinner due to GI bleeding. -EKG sinus rhythm  -Follow-up troponin 3 and 2-D echo normal EF  Abdominal pain: CT showed duodenal ulcer with inflammation. ? Perforation  - IV Protonix  BID -Avoid NSAIDs -lipase negative.  -GI and surgery consulted.  -Clear diet, plan for endoscopy on Tuesday.  -IV Cipro and flagyl.   GI bleed: FOBT positive. Hgh stable 9.1-->10.9. Hemodynamically stable. 09/22/2011 colonoscopy showed pandiverticulosis - Protonix IV  - Zofran IV for nausea - Avoid NSAIDs and SQ heparin -repeat hb in am/   GERD: -Protonix IV -Pepcid   Elevated lactic acid level : likely due to combination of syncope and worsening renal Fx. Patient does not have signs of infection, unlikely to have sepsis. -IVFas above  Chronic diastolic CHF: 2-D echo [id number] showed EF 60-65% with grade 2 diastolic dysfunction. Patient is on Lasix 20 mg daily at home. No leg edema. CHF is compensated. -Hold lasix due to worsening renal function  Anemia due to Iron deficiency and blood loss for GBI: -continue iron supplement -Follow-up by CBC GI consulted.  Hb stable.   Dementia:  -f/u SLP -PT/OT  Hypertension   -hold amlodipine  -hold lasix due to worsening renal function -IV hydralazine when necessary    AoCKD-II: Baseline creatinine 0.6-0.9. Her cre is 1.01 and BUN 27 on admission. Likely due to prerenal secondary to dehydration and continuation of diuretics. - IVF as above - Follow up renal function by BMP - Hold Diuretics  Code Status: DNR Family Communication: son at bedside.  Disposition Plan: remain inpatient for tx ulcer.    Consultants:  Surgery   GI  Procedures:  ECHO; pending.   Antibiotics:  HPI/Subjective: Denies abdominal pain, no vomiting.   Objective: Filed Vitals:   06/03/15 0423 06/03/15 1258  BP: 135/55 147/55  Pulse: 86 93  Temp: 97.4 F (36.3 C) 99.1 F (37.3 C)  Resp: 18 19    Intake/Output Summary (Last 24 hours) at 06/03/15 1301 Last data filed at 06/02/15 2152  Gross per 24 hour  Intake    100 ml  Output      0 ml  Net    100 ml   Filed Weights   06/01/15 2050 06/02/15 0128  Weight: 58.968 kg (130 lb) 56.926 kg (125 lb 8 oz)    Exam:   General:  NAD  Cardiovascular: S 1, S 2 RRR  Respiratory: CTA  Abdomen: bs present, soft, nt  Musculoskeletal: no edema  Data Reviewed: Basic Metabolic Panel:  Recent Labs Lab 06/01/15 2102 06/02/15 1141 06/03/15 0251  NA 138 144 142  K 4.2 3.7 3.4*  CL 98* 110 109  CO2 29 27 25   GLUCOSE 151* 96 86  BUN 27* 25* 23*  CREATININE 1.10* 0.87 0.80  CALCIUM 10.1 8.9 8.7*   Liver Function Tests: No results for input(s): AST, ALT, ALKPHOS, BILITOT, PROT, ALBUMIN in the last 168 hours.  Recent Labs Lab 06/02/15 0110  LIPASE 16   No results for input(s): AMMONIA in the last 168 hours. CBC:  Recent Labs Lab 06/01/15 2102 06/02/15 0110 06/02/15 0652 06/03/15 0251  WBC 7.8 10.8* 9.1 7.2  HGB 10.9* 9.8* 9.2* 8.1*  HCT 33.1* 29.7* 27.7* 24.8*  MCV 91.7 92.5 91.4 91.9  PLT 269 236 246 226   Cardiac Enzymes:  Recent Labs Lab 06/01/15 2124 06/02/15 0110 06/02/15 0652 06/02/15 1141  CKTOTAL  --  60  --   --   TROPONINI <0.03 <0.03 <0.03 <0.03   BNP (last 3  results)  Recent Labs  10/08/14 1025 06/02/15 0516  BNP 98.5 61.4    ProBNP (last 3 results) No results for input(s): PROBNP in the last 8760 hours.  CBG:  Recent Labs Lab 06/01/15 2106  GLUCAP 129*    Recent Results (from the past 240 hour(s))  MRSA PCR Screening     Status: None   Collection Time: 06/02/15  3:16 PM  Result Value Ref Range Status   MRSA by PCR NEGATIVE NEGATIVE Final    Comment:        The GeneXpert MRSA Assay (FDA approved for NASAL specimens only), is one component of a comprehensive MRSA colonization surveillance program. It is not intended to diagnose MRSA infection nor to guide or monitor treatment for MRSA infections.      Studies: Ct Abdomen Pelvis W Contrast  06/02/2015  CLINICAL DATA:  Syncopal episode during bowel movement tonight. LEFT lower quadrant pain. History of hypertension, aneurysm, diverticulitis, GI bleed, colonic polyps. EXAM: CT ABDOMEN AND PELVIS WITH CONTRAST TECHNIQUE: Multidetector CT imaging of the abdomen and pelvis was performed using the standard protocol following bolus administration of intravenous contrast. CONTRAST:  22mL OMNIPAQUE IOHEXOL 300 MG/ML  SOLN COMPARISON:  CT abdomen and pelvis March 22, 2015 FINDINGS: LUNG BASES: Included view of the lung bases are clear. Visualized heart and pericardium are unremarkable. SOLID ORGANS: The liver, spleen, and adrenal glands are unremarkable. Multiple subcentimeter gallstones without CT findings of acute cholecystitis. Fatty replaced pancreas. GASTROINTESTINAL TRACT: Small fluid-filled apparent ulceration with inflammation duodenum bulb. Moderate hiatal hernia. Proximal duodenum diverticulum. The stomach, and large bowel are normal in course and caliber without inflammatory changes. Severe colonic diverticulosis. Suspected interval appendectomy. KIDNEYS/ URINARY TRACT: Kidneys are orthotopic, demonstrating symmetric enhancement. No nephrolithiasis, hydronephrosis or solid renal  masses. 1 cm LEFT lower pole cyst. The unopacified ureters are normal in course and caliber. Delayed imaging through the kidneys demonstrates symmetric prompt contrast excretion within the proximal urinary collecting system. Urinary bladder is partially distended and unremarkable. PERITONEUM/RETROPERITONEUM: Aortoiliac vessels are normal in course and caliber, moderate to severe calcific atherosclerosis. No lymphadenopathy by CT size criteria. Status post hysterectomy. No intraperitoneal free fluid nor free air. SOFT TISSUE/OSSEOUS STRUCTURES: Non-suspicious. RIGHT hip total arthroplasty results in streak artifact. Old L1 moderate to severe compression fracture. Osteopenia. Moderate fat containing umbilical hernia. IMPRESSION: Suspected duodenal ulcer with surrounding inflammatory changes concerning for perforation without pneumoperitoneum. Again recommended is endoscopy. Severe colonic diverticulosis without CT findings of acute diverticulitis. Cholelithiasis without CT findings of acute cholecystitis. Electronically Signed   By: Elon Alas M.D.   On: 06/02/2015 01:09   Dg Chest Portable 1 View  06/01/2015  CLINICAL DATA:  Syncope and weakness EXAM: PORTABLE CHEST 1 VIEW COMPARISON:  10/08/2014 FINDINGS: The heart size and mediastinal contours are within normal limits. Aortic calcifications noted. Small hiatal hernia noted. Both lungs are clear. The visualized skeletal structures are unremarkable.  IMPRESSION: No active disease. Electronically Signed   By: Kerby Moors M.D.   On: 06/01/2015 21:59    Scheduled Meds: .  stroke: mapping our early stages of recovery book   Does not apply Once  . ciprofloxacin  400 mg Intravenous Q12H  . metronidazole  250 mg Intravenous Q8H  . pantoprazole (PROTONIX) IV  40 mg Intravenous Q12H   Continuous Infusions: . sodium chloride 75 mL/hr at 06/03/15 0944  . sodium chloride 20 mL/hr at 06/02/15 1145    Principal Problem:   Syncope Active Problems:    Acute on chronic kidney failure (HCC), stage ii   GERD (gastroesophageal reflux disease)   Dementia   GIB (gastrointestinal bleeding)   Chronic diastolic CHF (congestive heart failure) (HCC)   Anemia, iron deficiency   Essential hypertension   Abdominal pain   Elevated lactic acid level    Time spent: 25 minutes.     Niel Hummer A  [facility] Pager [phone]. If 7PM-7AM, please contact night-coverage at [email or web address], password [facility] 06/03/2015, 1:01 PM  LOS: 1 day

## 2015-06-03 NOTE — Evaluation (Signed)
Physical Therapy Evaluation Patient Details Name: Sarah Perkins MRN: [id number] DOB: 09/06/1917 Today's Date: 06/03/2015   History of Present Illness  pt presents with Duodenal Ulcer and GIB.  pt with multiple recent hospitalizations for weakness.  pt with hx of CHF, HTN, Cerebral Aneurysm, GIB, and Dementia.    Clinical Impression  Pt globally weak and debilitated requiring extensive A for all mobility.  Pt seems fearful of falling at times causing her to lean posteriorly during mobility.  Son present throughout session and agrees that pt is requiring more A than he can provide at this time.  Feel pt would benefit from SNF level of care at D/C and feel it may be worth consulting Palliative Medicine for a [place] conversation with pt and son.  Will continue to follow.      Follow Up Recommendations SNF    Equipment Recommendations  None recommended by PT    Recommendations for Other Services       Precautions / Restrictions Precautions Precautions: Fall Restrictions Weight Bearing Restrictions: No      Mobility  Bed Mobility Overal bed mobility: Needs Assistance Bed Mobility: Supine to Sit;Sit to Supine     Supine to sit: Max assist;HOB elevated Sit to supine: Max assist   General bed mobility comments: pt does attempt to A with coming to sitting, but requires MaxA for all movement  Transfers Overall transfer level: Needs assistance Equipment used: 1 person hand held assist Transfers: Sit to/from Stand Sit to Stand: Max assist         General transfer comment: cues and faciliation for placement of Bil feet and bringin body over BOS.  pt seems fearful and leans posteriorly with both PT A and when son A.  pt needs bil feet blocked, but is also incontinent of urine in standing.    Ambulation/Gait                Stairs            Wheelchair Mobility    Modified Rankin (Stroke Patients Only)       Balance Overall balance assessment: Needs  assistance;History of Falls Sitting-balance support: Bilateral upper extremity supported;Feet supported Sitting balance-Leahy Scale: Fair     Standing balance support: During functional activity Standing balance-Leahy Scale: Poor                               Pertinent Vitals/Pain Pain Assessment: Faces Faces Pain Scale: Hurts little more Pain Location: Grimaces during mobility, but when asked location states "All over". Pain Descriptors / Indicators: Grimacing Pain Intervention(s): Monitored during session;Repositioned    Home Living Family/patient expects to be discharged to:: Skilled nursing facility                      Prior Function Level of Independence: Needs assistance   Gait / Transfers Assistance Needed: pt transfers to W/C with son's A.    ADL's / Homemaking Assistance Needed: son provides A for all ADLs and he performs all homemaking tasks.          Hand Dominance   Dominant Hand: Right    Extremity/Trunk Assessment   Upper Extremity Assessment: Generalized weakness           Lower Extremity Assessment: Generalized weakness (Decreased ROM Bil knees. Per son arthritis.)      Cervical / Trunk Assessment: Kyphotic  Communication   Communication: HOH  Cognition  Arousal/Alertness: Awake/alert Behavior During Therapy: Flat affect Overall Cognitive Status: History of cognitive impairments - at baseline                      General Comments      Exercises        Assessment/Plan    PT Assessment Patient needs continued PT services  PT Diagnosis Difficulty walking;Generalized weakness   PT Problem List Decreased strength;Decreased activity tolerance;Decreased balance;Decreased range of motion;Decreased mobility;Decreased coordination;Decreased cognition;Decreased knowledge of use of DME;Pain  PT Treatment Interventions DME instruction;Gait training;Functional mobility training;Therapeutic activities;Therapeutic  exercise;Balance training;Cognitive remediation;Patient/family education   PT Goals (Current goals can be found in the Care Plan section) Acute Rehab PT Goals Patient Stated Goal: Per son for pt to get stronger PT Goal Formulation: With family Time For Goal Achievement: 06/17/15 Potential to Achieve Goals: Fair    Frequency Min 2X/week   Barriers to discharge        Co-evaluation               End of Session Equipment Utilized During Treatment: Gait belt Activity Tolerance: Patient limited by fatigue Patient left: in bed;with call bell/phone within reach;with family/visitor present Nurse Communication: Mobility status         Time: [id number] PT Time Calculation (min) (ACUTE ONLY): 33 min   Charges:   PT Evaluation $PT Eval Moderate Complexity: 1 Procedure PT Treatments $Therapeutic Activity: 8-22 mins   PT G CodesCatarina Hartshorn, [place] [phone] 06/03/2015, 3:53 PM

## 2015-06-03 NOTE — Progress Notes (Addendum)
UNASSIGNED PATIENT CROSS COVER [facility]-GI Subjective: Patient denies having any abdominal pain, nausea or vomiting. Her son is at her bedside. He is encouraging her to consume liquids that she is being offered. She denies having a BM today. Gradual drop in hemoglobin from 9.8-9.1-8.1 gm/dl today.  Objective:  Vital signs in last 24 hours: Temp:  [97.4 F (36.3 C)-98.1 F (36.7 C)] 97.4 F (36.3 C) (02/04 0423) Pulse Rate:  [76-92] 86 (02/04 0423) Resp:  [18] 18 (02/04 0423) BP: (104-136)/(42-67) 135/55 mmHg (02/04 0423) SpO2:  [94 %-95 %] 95 % (02/04 0423) Last BM Date: 06/01/15  Intake/Output from previous day: 02/03 0701 - 02/04 0700 In: 100 [IV Piggyback:100] Out: -  Intake/Output this shift:   General appearance: cooperative, appears stated age, fatigued, no distress and pale Resp: clear to auscultation bilaterally Cardio: regular rate and rhythm, S1, S2 normal, no murmur, click, rub or gallop GI: soft, non-tender; bowel sounds normal; no masses,  no organomegaly  Lab Results:  Recent Labs  06/02/15 0110 06/02/15 0652 06/03/15 0251  WBC 10.8* 9.1 7.2  HGB 9.8* 9.2* 8.1*  HCT 29.7* 27.7* 24.8*  PLT 236 246 226   BMET  Recent Labs  06/01/15 2102 06/02/15 1141 06/03/15 0251  NA 138 144 142  K 4.2 3.7 3.4*  CL 98* 110 109  CO2 29 27 25   GLUCOSE 151* 96 86  BUN 27* 25* 23*  CREATININE 1.10* 0.87 0.80  CALCIUM 10.1 8.9 8.7*   PT/INR  Recent Labs  06/02/15 0110  LABPROT 15.9*  INR 1.25   Studies/Results: Ct Abdomen Pelvis W Contrast  06/02/2015  CLINICAL DATA:  Syncopal episode during bowel movement tonight. LEFT lower quadrant pain. History of hypertension, aneurysm, diverticulitis, GI bleed, colonic polyps. EXAM: CT ABDOMEN AND PELVIS WITH CONTRAST TECHNIQUE: Multidetector CT imaging of the abdomen and pelvis was performed using the standard protocol following bolus administration of intravenous contrast. CONTRAST:  92mL OMNIPAQUE IOHEXOL 300 MG/ML  SOLN  COMPARISON:  CT abdomen and pelvis March 22, 2015 FINDINGS: LUNG BASES: Included view of the lung bases are clear. Visualized heart and pericardium are unremarkable. SOLID ORGANS: The liver, spleen, and adrenal glands are unremarkable. Multiple subcentimeter gallstones without CT findings of acute cholecystitis. Fatty replaced pancreas. GASTROINTESTINAL TRACT: Small fluid-filled apparent ulceration with inflammation duodenum bulb. Moderate hiatal hernia. Proximal duodenum diverticulum. The stomach, and large bowel are normal in course and caliber without inflammatory changes. Severe colonic diverticulosis. Suspected interval appendectomy. KIDNEYS/ URINARY TRACT: Kidneys are orthotopic, demonstrating symmetric enhancement. No nephrolithiasis, hydronephrosis or solid renal masses. 1 cm LEFT lower pole cyst. The unopacified ureters are normal in course and caliber. Delayed imaging through the kidneys demonstrates symmetric prompt contrast excretion within the proximal urinary collecting system. Urinary bladder is partially distended and unremarkable. PERITONEUM/RETROPERITONEUM: Aortoiliac vessels are normal in course and caliber, moderate to severe calcific atherosclerosis. No lymphadenopathy by CT size criteria. Status post hysterectomy. No intraperitoneal free fluid nor free air. SOFT TISSUE/OSSEOUS STRUCTURES: Non-suspicious. RIGHT hip total arthroplasty results in streak artifact. Old L1 moderate to severe compression fracture. Osteopenia. Moderate fat containing umbilical hernia. IMPRESSION: Suspected duodenal ulcer with surrounding inflammatory changes concerning for perforation without pneumoperitoneum. Again recommended is endoscopy. Severe colonic diverticulosis without CT findings of acute diverticulitis. Cholelithiasis without CT findings of acute cholecystitis. Electronically Signed   By: Sarah Perkins M.D.   On: 06/02/2015 01:09   Dg Chest Portable 1 View  06/01/2015  CLINICAL DATA:  Syncope and  weakness EXAM: PORTABLE CHEST 1 VIEW  COMPARISON:  10/08/2014 FINDINGS: The heart size and mediastinal contours are within normal limits. Aortic calcifications noted. Small hiatal hernia noted. Both lungs are clear. The visualized skeletal structures are unremarkable. IMPRESSION: No active disease. Electronically Signed   By: Kerby Moors M.D.   On: 06/01/2015 21:59   Medications: I have reviewed the patient's current medications.  Assessment/Plan: 1) ?Perforated duodenal ulcer-abnormal CT scan/GERD: doing well ob clears and antibiotics. Continue present care with conservative management. 2) Extensive colonic diverticulosis/Chronic constipation.Marland Kitchen 3) Umbilical hernia containing fat on CT scan. 4) Cholelithiasis with acute cholecystitis. 5) Failure to thrive.  .  LOS: 1 day   [name] 06/03/2015, 10:54 AM

## 2015-06-04 DIAGNOSIS — K263 Acute duodenal ulcer without hemorrhage or perforation: Secondary | ICD-10-CM

## 2015-06-04 LAB — BASIC METABOLIC PANEL
ANION GAP: 9 (ref 5–15)
BUN: 13 mg/dL (ref 6–20)
CHLORIDE: 104 mmol/L (ref 101–111)
CO2: 23 mmol/L (ref 22–32)
CREATININE: 0.75 mg/dL (ref 0.44–1.00)
Calcium: 8.1 mg/dL — ABNORMAL LOW (ref 8.9–10.3)
GFR calc non Af Amer: >60 mL/min (ref >60)
Glucose, Bld: 107 mg/dL — ABNORMAL HIGH (ref 65–99)
POTASSIUM: 3.2 mmol/L — AB (ref 3.5–5.1)
SODIUM: 136 mmol/L (ref 135–145)

## 2015-06-04 LAB — CBC
HEMATOCRIT: 23.4 % — AB (ref 36.0–46.0)
HEMOGLOBIN: 7.7 g/dL — AB (ref 12.0–15.0)
MCH: 30 pg (ref 26.0–34.0)
MCHC: 32.9 g/dL (ref 30.0–36.0)
MCV: 91.1 fL (ref 78.0–100.0)
Platelets: 215 10*3/uL (ref 150–400)
RBC: 2.57 MIL/uL — AB (ref 3.87–5.11)
RDW: 15.1 % (ref 11.5–15.5)
WBC: 9.5 10*3/uL (ref 4.0–10.5)

## 2015-06-04 MED ORDER — METRONIDAZOLE IVPB CUSTOM
250.0000 mg | Freq: Three times a day (TID) | INTRAVENOUS | Status: DC
Start: 2015-06-04 — End: 2015-06-08
  Administered 2015-06-04 – 2015-06-08 (×11): 250 mg via INTRAVENOUS
  Filled 2015-06-04: qty 50
  Filled 2015-06-04: qty 100
  Filled 2015-06-04 (×4): qty 50
  Filled 2015-06-04: qty 100
  Filled 2015-06-04 (×3): qty 50
  Filled 2015-06-04: qty 100
  Filled 2015-06-04 (×6): qty 50

## 2015-06-04 MED ORDER — SODIUM CHLORIDE 0.9 % IV SOLN
Freq: Once | INTRAVENOUS | Status: AC
Start: 2015-06-04 — End: 2015-06-04
  Administered 2015-06-04: 09:00:00 via INTRAVENOUS

## 2015-06-04 MED ORDER — POTASSIUM CHLORIDE 10 MEQ/100ML IV SOLN: 10.0000 meq | INTRAVENOUS | Status: DC

## 2015-06-04 MED ORDER — POTASSIUM CHLORIDE 10 MEQ/100ML IV SOLN
10.0000 meq | INTRAVENOUS | Status: AC
  Filled 2015-06-04: qty 100

## 2015-06-04 MED ORDER — POTASSIUM CHLORIDE CRYS ER 20 MEQ PO TBCR
20.0000 meq | EXTENDED_RELEASE_TABLET | Freq: Once | ORAL | Status: AC
  Administered 2015-06-04: 20 meq via ORAL
  Filled 2015-06-04: qty 1

## 2015-06-04 MED ORDER — CIPROFLOXACIN IN D5W 400 MG/200ML IV SOLN
400.0000 mg | Freq: Two times a day (BID) | INTRAVENOUS | Status: DC
  Administered 2015-06-04 – 2015-06-08 (×8): 400 mg via INTRAVENOUS
  Filled 2015-06-04 (×9): qty 200

## 2015-06-04 MED ORDER — METRONIDAZOLE IVPB CUSTOM
250.0000 mg | Freq: Three times a day (TID) | INTRAVENOUS | Status: DC
  Filled 2015-06-04: qty 50
  Filled 2015-06-04: qty 100
  Filled 2015-06-04 (×2): qty 50

## 2015-06-04 NOTE — Progress Notes (Signed)
Pt receiving antibiotic infusion, rate of 100 ml/hr.  Some redness noted at IV site extending toward Pt elbow.  Pt denies any pain.  No signs of infusion infiltration.  Call placed to attending to notify.  Order entered by attending for IR placement of PICC.  Will cont to attempt to infuse antibiotics at slow rate until PICC line can be placed.  Order for potassium changed to PO.  Will give in applesauce.  Cont to monitor infusion.

## 2015-06-04 NOTE — Progress Notes (Signed)
[facility] PROGRESS NOTE  Sarah Perkins [id number] DOB: April 04, 1918 DOA: 06/01/2015 PCP: Thressa Sheller, MD  Assessment/Plan: HPI: Sarah Perkins is a 82 y.o. female with PMH of GI bleeding due to diverticular, diverticulitis, dementia, duodenitis, hypertension, diastolic congestive heart failure, GERD, cerebral aneurysm repair 1970, who presents with syncope, abdominal pain and rectal bleeding.  Abdominal pain: CT showed duodenal ulcer with inflammation. ? Perforation  - IV Protonix  BID -Avoid NSAIDs -lipase negative.  -GI and surgery consulted.  -Clear diet, plan for endoscopy on Tuesday.  -IV Cipro and flagyl.   Syncope: suspect vasovagal.  -trop x 3 negative -2d echo; normal EF, mild diastolic dysfunction   Questionable atrial fibrillation with RVR: This is reported by EMS, however EKG in ED showed sinus rhythm with occasional PAC. Patient is not a candidate for blood thinner due to GI bleeding. -EKG sinus rhythm  -Follow-up troponin 3 and 2-D echo normal EF  GI bleed: FOBT positive. Hgh stable 9.1-->10.9. Hemodynamically stable. 09/22/2011 colonoscopy showed pandiverticulosis - Protonix IV  - Zofran IV for nausea - Avoid NSAIDs and SQ heparin -hb decrease to 7, will transfuse one unit.   GERD: -Protonix IV -Pepcid   Elevated lactic acid level : likely due to combination of syncope and worsening renal Fx. Patient does not have signs of infection, unlikely to have sepsis. -IVFas above  Chronic diastolic CHF: 2-D echo 99991111 showed EF 60-65% with grade 2 diastolic dysfunction. Patient is on Lasix 20 mg daily at home. No leg edema. CHF is compensated. -Hold lasix due to worsening renal function Will NSL fluids.   Anemia due to Iron deficiency and blood loss for GBI: -continue iron supplement -Follow-up by CBC GI consulted.  Getting one unit PRBC  Dementia:  -f/u SLP -PT/OT  Hypertension   -hold amlodipine  -hold lasix due to  worsening renal function -IV hydralazine when necessary   AoCKD-II: Baseline creatinine 0.6-0.9. Her cre is 1.01 and BUN 27 on admission. Likely due to prerenal secondary to dehydration and continuation of diuretics. - IVF as above - Follow up renal function by BMP - Hold Diuretics  Code Status: DNR Family Communication: son at bedside.  Disposition Plan: remain inpatient for tx ulcer.    Consultants:  Surgery   GI  Procedures:  ECHO; NL EF  Antibiotics:  HPI/Subjective: Denies abdominal pain, no vomiting.   Objective: Filed Vitals:   06/04/15 0936 06/04/15 1212  BP: 139/91 140/45  Pulse: 98 90  Temp: 98 F (36.7 C) 98.7 F (37.1 C)  Resp: 16 17    Intake/Output Summary (Last 24 hours) at 06/04/15 1311 Last data filed at 06/04/15 0936  Gross per 24 hour  Intake    335 ml  Output      0 ml  Net    335 ml   Filed Weights   06/01/15 2050 06/02/15 0128  Weight: 58.968 kg (130 lb) 56.926 kg (125 lb 8 oz)    Exam:   General:  NAD  Cardiovascular: S 1, S 2 RRR  Respiratory: CTA  Abdomen: bs present, soft, nt  Musculoskeletal: no edema  Data Reviewed: Basic Metabolic Panel:  Recent Labs Lab 06/01/15 2102 06/02/15 1141 06/03/15 0251 06/04/15 0238  NA 138 144 142 136  K 4.2 3.7 3.4* 3.2*  CL 98* 110 109 104  CO2 29 27 25 23   GLUCOSE 151* 96 86 107*  BUN 27* 25* 23* 13  CREATININE 1.10* 0.87 0.80 0.75  CALCIUM 10.1 8.9 8.7* 8.1*  Liver Function Tests: No results for input(s): AST, ALT, ALKPHOS, BILITOT, PROT, ALBUMIN in the last 168 hours.  Recent Labs Lab 06/02/15 0110  LIPASE 16   No results for input(s): AMMONIA in the last 168 hours. CBC:  Recent Labs Lab 06/01/15 2102 06/02/15 0110 06/02/15 0652 06/03/15 0251 06/04/15 0238  WBC 7.8 10.8* 9.1 7.2 9.5  HGB 10.9* 9.8* 9.2* 8.1* 7.7*  HCT 33.1* 29.7* 27.7* 24.8* 23.4*  MCV 91.7 92.5 91.4 91.9 91.1  PLT 269 236 246 226 215   Cardiac Enzymes:  Recent Labs Lab  06/01/15 2124 06/02/15 0110 06/02/15 0652 06/02/15 1141  CKTOTAL  --  60  --   --   TROPONINI <0.03 <0.03 <0.03 <0.03   BNP (last 3 results)  Recent Labs  10/08/14 1025 06/02/15 0516  BNP 98.5 61.4    ProBNP (last 3 results) No results for input(s): PROBNP in the last 8760 hours.  CBG:  Recent Labs Lab 06/01/15 2106  GLUCAP 129*    Recent Results (from the past 240 hour(s))  MRSA PCR Screening     Status: None   Collection Time: 06/02/15  3:16 PM  Result Value Ref Range Status   MRSA by PCR NEGATIVE NEGATIVE Final    Comment:        The GeneXpert MRSA Assay (FDA approved for NASAL specimens only), is one component of a comprehensive MRSA colonization surveillance program. It is not intended to diagnose MRSA infection nor to guide or monitor treatment for MRSA infections.      Studies: No results found.  Scheduled Meds: .  stroke: mapping our early stages of recovery book   Does not apply Once  . ciprofloxacin  400 mg Intravenous Q12H  . metronidazole  250 mg Intravenous Q8H  . pantoprazole (PROTONIX) IV  40 mg Intravenous Q12H   Continuous Infusions: . sodium chloride 50 mL/hr at 06/04/15 0031  . sodium chloride 20 mL/hr at 06/02/15 1145    Principal Problem:   Syncope Active Problems:   Acute on chronic kidney failure (HCC), stage ii   GERD (gastroesophageal reflux disease)   Dementia   GIB (gastrointestinal bleeding)   Chronic diastolic CHF (congestive heart failure) (HCC)   Anemia, iron deficiency   Essential hypertension   Abdominal pain   Elevated lactic acid level    Time spent: 25 minutes.     Niel Hummer A  [facility] Pager [phone]. If 7PM-7AM, please contact night-coverage at [email or web address], password [facility] 06/04/2015, 1:11 PM  LOS: 2 days

## 2015-06-04 NOTE — Progress Notes (Signed)
Peripherally Inserted Central Catheter/Midline Placement  The IV Nurse has discussed with the patient and/or persons authorized to consent for the patient, the purpose of this procedure and the potential benefits and risks involved with this procedure.  The benefits include less needle sticks, lab draws from the catheter and patient may be discharged home with the catheter.  Risks include, but not limited to, infection, bleeding, blood clot (thrombus formation), and puncture of an artery; nerve damage and irregular heat beat.  Alternatives to this procedure were also discussed.  Spoke with son via telephone.  Consent given.  PICC/Midline Placement Documentation    Unable to successfully place PICC by 2 IVT RN's.  Able to access right brachial vein , but unable to thread the guidewire beyond the axilla/subclavian region.  Left arm assessed, but unable to attempt due to 'contracture' and inability to move left arm for PICC placement.  Please refer to IR if PICC still desired.  Son outside of room notified of events.  Pt tolerated procedure well.    Rolena Infante 06/04/2015, 4:56 PM

## 2015-06-04 NOTE — Progress Notes (Signed)
CROSS COVER [facility]-GI Subjective: Since I last evaluated the patient, there has not been much change. She seems to be pleasantly confused. She denies having any abdominal pain nausea vomiting. She is tolerating clears well but is not consuming much as per her son who is at her bedside. There is no history of melena or hematochezia..   Objective: Vital signs in last 24 hours: Temp:  [97.7 F (36.5 C)-98.7 F (37.1 C)] 97.9 F (36.6 C) (02/05 1422) Pulse Rate:  [89-99] 89 (02/05 1422) Resp:  [16-18] 16 (02/05 1422) BP: (133-150)/(45-91) 138/48 mmHg (02/05 1422) SpO2:  [90 %-96 %] 95 % (02/05 1422) Last BM Date: 06/02/15  Intake/Output from previous day:   Intake/Output this shift: Total I/O In: 335 [Blood:335] Out: -   General appearance: cooperative, appears stated age, fatigued, no distress, pale and slowed mentation Resp: clear to auscultation bilaterally Cardio: regular rate and rhythm, S1, S2 normal, no murmur, click, rub or gallop GI: soft, non-tender; bowel sounds normal; no masses,  no organomegaly  Lab Results:  Recent Labs  06/02/15 0652 06/03/15 0251 06/04/15 0238  WBC 9.1 7.2 9.5  HGB 9.2* 8.1* 7.7*  HCT 27.7* 24.8* 23.4*  PLT 246 226 215   BMET  Recent Labs  06/02/15 1141 06/03/15 0251 06/04/15 0238  NA 144 142 136  K 3.7 3.4* 3.2*  CL 110 109 104  CO2 27 25 23   GLUCOSE 96 86 107*  BUN 25* 23* 13  CREATININE 0.87 0.80 0.75  CALCIUM 8.9 8.7* 8.1*    PT/INR  Recent Labs  06/02/15 0110  LABPROT 15.9*  INR 1.25   Medications: I have reviewed the patient's current medications.  Assessment/Plan: 1) GERD/Large walled off perforated perforated duodenal ulcer on CT scan-Patient seems to be doing well on PPIs telemetry liquid diet and repeat endoscopies planned on Tuesday, 06/06/2015. 2) Post-hemorrhagic anemia secondary to 1. 3) Colonic diverticulosis. 4) Chronic protein calorie malnutrition and failure to thrive    LOS: 2 days   Kimbely Whiteaker 06/04/2015, 5:39 PM

## 2015-06-05 ENCOUNTER — Inpatient Hospital Stay (HOSPITAL_COMMUNITY): Payer: Medicare Other

## 2015-06-05 DIAGNOSIS — K263 Acute duodenal ulcer without hemorrhage or perforation: Secondary | ICD-10-CM

## 2015-06-05 LAB — CBC
HCT: 28.7 % — ABNORMAL LOW (ref 36.0–46.0)
HEMOGLOBIN: 9.7 g/dL — AB (ref 12.0–15.0)
MCH: 30 pg (ref 26.0–34.0)
MCHC: 33.8 g/dL (ref 30.0–36.0)
MCV: 88.9 fL (ref 78.0–100.0)
Platelets: 220 10*3/uL (ref 150–400)
RBC: 3.23 MIL/uL — ABNORMAL LOW (ref 3.87–5.11)
RDW: 16.7 % — AB (ref 11.5–15.5)
WBC: 7.3 10*3/uL (ref 4.0–10.5)

## 2015-06-05 LAB — BASIC METABOLIC PANEL
Anion gap: 9 (ref 5–15)
BUN: 9 mg/dL (ref 6–20)
CALCIUM: 8.5 mg/dL — AB (ref 8.9–10.3)
CHLORIDE: 104 mmol/L (ref 101–111)
CO2: 25 mmol/L (ref 22–32)
CREATININE: 0.78 mg/dL (ref 0.44–1.00)
GFR calc non Af Amer: >60 mL/min (ref >60)
Glucose, Bld: 97 mg/dL (ref 65–99)
Potassium: 3.5 mmol/L (ref 3.5–5.1)
SODIUM: 138 mmol/L (ref 135–145)

## 2015-06-05 MED ORDER — LIDOCAINE HCL 1 % IJ SOLN
INTRAMUSCULAR | Status: AC
  Filled 2015-06-05: qty 20

## 2015-06-05 MED ORDER — SODIUM CHLORIDE 0.9% FLUSH
10.0000 mL | Freq: Two times a day (BID) | INTRAVENOUS | Status: DC
  Administered 2015-06-06 (×2): 10 mL

## 2015-06-05 MED ORDER — SODIUM CHLORIDE 0.9% FLUSH
10.0000 mL | INTRAVENOUS | Status: DC | PRN
  Administered 2015-06-05 – 2015-06-08 (×4): 10 mL
  Filled 2015-06-05 (×3): qty 40

## 2015-06-05 MED ORDER — IOHEXOL 300 MG/ML  SOLN
50.0000 mL | Freq: Once | INTRAMUSCULAR | Status: AC | PRN
  Administered 2015-06-05: 8 mL via INTRAVENOUS

## 2015-06-05 NOTE — Clinical Social Work Note (Signed)
Clinical Social Work Assessment  Patient Details  Name: Sarah Perkins MRN: [id number] Date of Birth: February 25, 1918  Date of referral:  06/05/15               Reason for consult:  Facility Placement                Permission sought to share information with:  Family Supports, Customer service manager Permission granted to share information::  Yes, Verbal Permission Granted  Name::     Ravonda, Waldmann [phone] or [phone]  Agency::  SNF admissions  Relationship::     Contact Information:     Housing/Transportation Living arrangements for the past 2 months:  [place] of Information:  Patient, Adult Children Patient Interpreter Needed:  None Criminal Activity/Legal Involvement Pertinent to Current Situation/Hospitalization:  No - Comment as needed Significant Relationships:  Adult Children Lives with:  Adult Children Do you feel safe going back to the place where you live?  No (Patient needs some short term rehab before she can return back home.) Need for family participation in patient care:  Yes (Comment)  Care giving concerns: Patient's son feels patient needs some short term rehab before she can return back home.   Social Worker assessment / plan: Patient is a 82 year old female who lives with her son and has some dementia.  Patient was confused, CSW spoke to patient's son to complete assessment.  Patient's son reports that patient was in rehab at [facility], but then she was discharged home for about 3 weeks, then had to be brought back to hospital.  Patient's son expressed she did well once she returned back home, but then she started having problems again.  Patient's son states he would like [facility], [facility], or [facility] for SNF if possible.  CSW explained to patient's son that if the SNFs are not able to take patient then he will have to look at a different facility, patient's son expressed understanding.  Patient's son  expressed he did not have any other questions or concerns.  Employment status:  Retired Forensic scientist:  Medicare PT Recommendations:  [place] / Referral to community resources:  [place]  Patient/Family's Response to care: Patient and son in agreement to going to SNF for rehab.  Patient/Family's Understanding of and Emotional Response to Diagnosis, Current Treatment, and Prognosis:  Patient's son is aware of current treatment plan and prognosis, patient has some confusion and dementia.  Emotional Assessment Appearance:  Appears stated age Attitude/Demeanor/Rapport:    Affect (typically observed):  Appropriate, Stable, Calm Orientation:  Oriented to Self Alcohol / Substance use:  Not Applicable Psych involvement (Current and /or in the community):  No (Comment)  Discharge Needs  Concerns to be addressed:  No discharge needs identified Readmission within the last 30 days:  No Current discharge risk:  None Barriers to Discharge:  No Barriers Identified   Ross Ludwig, LCSWA 06/05/2015, 6:28 PM

## 2015-06-05 NOTE — Clinical Documentation Improvement (Signed)
Internal Medicine Gastroenterology  Documentation states "elevated lactic acid". Can this nonspecific term be further specified?   Acidosis  Lactic acidosis  Other  Clinically Undetermined  Please exercise your independent, professional judgment when responding. A specific answer is not anticipated or expected.  Thank You, Ezekiel Ina RN [facility] [phone]

## 2015-06-05 NOTE — Evaluation (Signed)
Occupational Therapy Evaluation Patient Details Name: Sarah Perkins MRN: [id number] DOB: Nov 28, 1917 Today's Date: 06/05/2015    History of Present Illness pt presents with Duodenal Ulcer and GIB.  pt with multiple recent hospitalizations for weakness.  pt with hx of CHF, HTN, Cerebral Aneurysm, GIB, and Dementia.     Clinical Impression   Pt can typically feed herself with set up and is otherwise dependent in ADL and mobility.  Pt presents with generalized weakness and pain and requires max assist for bed level mobility. No acute OT needs identified.  Recommending SNF. Palliative care consult may be worth pursuing, son in agreement.     Follow Up Recommendations  SNF;Supervision/Assistance - 24 hour (palliative care consult)    Equipment Recommendations  Hospital bed     Recommendations for Other Services       Precautions / Restrictions Precautions Precautions: Fall Restrictions Weight Bearing Restrictions: No      Mobility Bed Mobility Overal bed mobility: Needs Assistance Bed Mobility: Supine to Sit;Sit to Supine     Supine to sit: Max assist;HOB elevated Sit to supine: Max assist   General bed mobility comments: assist for all aspects  Transfers                 General transfer comment: did not attempt, pt with posterior lean, extension of LEs    Balance Overall balance assessment: Needs assistance   Sitting balance-Leahy Scale: Poor   Postural control: Posterior lean                                  ADL Overall ADL's : Needs assistance/impaired Eating/Feeding: Moderate assistance;Bed level Eating/Feeding Details (indicate cue type and reason): pt can drink with set up, assisted for items requiring utensils with pt on liquid diet Grooming: Wash/dry hands;Wash/dry face;Bed level;Minimal assistance                               Functional mobility during ADLs:  (pt is non ambulatory) General ADL Comments: Pt is  dependent at baseline in bathing, dressing and toileting.      Vision     Perception     Praxis      Pertinent Vitals/Pain Pain Assessment: Faces Faces Pain Scale: Hurts little more Pain Location: generalized Pain Descriptors / Indicators: Grimacing Pain Intervention(s): Monitored during session;Repositioned     Hand Dominance Right   Extremity/Trunk Assessment Upper Extremity Assessment Upper Extremity Assessment: Generalized weakness   Lower Extremity Assessment Lower Extremity Assessment: Defer to PT evaluation   Cervical / Trunk Assessment Cervical / Trunk Assessment: Kyphotic   Communication Communication Communication: HOH   Cognition Arousal/Alertness: Awake/alert Behavior During Therapy: Flat affect Overall Cognitive Status: History of cognitive impairments - at baseline                     General Comments       Exercises       Shoulder Instructions      Home Living Family/patient expects to be discharged to:: Skilled nursing facility Living Arrangements: Children                               Additional Comments: Pt has been living with son who was providing 24 hour assist       Prior Functioning/Environment Level  of Independence: Needs assistance  Gait / Transfers Assistance Needed: pt transfers to W/C with son's A.   ADL's / Homemaking Assistance Needed: son provides A for bathing and dressing and he performs all homemaking tasks, pt can self feed with set up          OT Diagnosis: Generalized weakness;Cognitive deficits;Acute pain   OT Problem List:     OT Treatment/Interventions:      OT Goals(Current goals can be found in the care plan section) Acute Rehab OT Goals Patient Stated Goal: pt needs to be able to assist with standing for him to manage her at home  OT Frequency:     Barriers to D/C:            Co-evaluation              End of Session Nurse Communication:  (palliative care  appropriate?)  Activity Tolerance: Patient limited by fatigue Patient left: in bed;with call bell/phone within reach;with bed alarm set;with family/visitor present;with SCD's reapplied   Time: [id number] OT Time Calculation (min): 31 min Charges:  OT General Charges $OT Visit: 1 Procedure OT Evaluation $OT Eval Moderate Complexity: 1 Procedure OT Treatments $Self Care/Home Management : 8-22 mins G-Codes:    [name] 06/05/2015, 12:22 PM  [phone]

## 2015-06-05 NOTE — Procedures (Signed)
Interventional Radiology Procedure Note  Procedure: Placement of a left UE double lumen PICC.  Tip is positioned at the superior cavoatrial junction and catheter is ready for immediate use.  Complications: None Recommendations:  - Ok to shower tomorrow - Do not submerge  - Routine line care   Signed,  Dulcy Fanny. Earleen Newport, DO

## 2015-06-05 NOTE — Progress Notes (Signed)
OT Cancellation Note  Patient Details Name: Sarah Perkins MRN: [id number] DOB: November 12, 1917   Cancelled Treatment:    Reason Eval/Treat Not Completed: Patient at procedure or test/ unavailable (Will follow.)  [name] 06/05/2015, 9:52 AM

## 2015-06-05 NOTE — Progress Notes (Signed)
Speech Language Pathology Dysphagia Treatment Patient Details Name: TARAJI MUNGO MRN: [id number] DOB: 1917-12-26 Today's Date: 06/05/2015 Time: 8616-8372 SLP Time Calculation (min) (ACUTE ONLY): 10 min  Assessment / Plan / Recommendation Clinical Impression    SLP provided skilled observation of POs at bedside to determine diet toleration. No evidence of dysphagia and no s/s of penetration/aspiration observed. Pt did not c/o any difficulty swallowing since BSE. Pt educated re: diet recommendation. Recommend Dysphagia 2 diet, thin liquids and meds whole in puree. No further SLP intervention necessary at this time.    Diet Recommendation    Dysphagia 2 diet, thin liquids, meds whole in puree   SLP Plan All goals met      Swallowing Goals     General Behavior/Cognition: Alert;Cooperative Patient Positioning: Upright in bed HPI: 82 y.o. female with h/o GI bleeding due to diverticular, diverticulitis, dementia, duodenitis, hypertension, diastolic congestive heart failure, GERD, cerebral aneurysm repair 1970, acid reflux, tremor, who presented upon admission with syncope, abdominal pain and rectal bleeding. Per MD note, son repoorted pt had one episode of difficulty speaking on Saturday. CXR 2/2 no active disease. Previous bedside swallow evaluation dated November 2016.  Oral Cavity - Oral Hygiene     Dysphagia Treatment Treatment Methods: Skilled observation;Patient/caregiver education;Differential diagnosis Patient observed directly with PO's: Yes Type of PO's observed: Dysphagia 2 (chopped);Thin liquids Feeding: Able to feed self;Needs assist Liquids provided via: Cup;Straw Type of cueing: Verbal Amount of cueing: Minimal   GO     Daveda Larock 06/05/2015, 1:55 PM  Titus Mould, Student-SLP

## 2015-06-05 NOTE — Care Management Important Message (Signed)
Important Message  Patient Details  Name: Sarah Perkins MRN: [id number] Date of Birth: 25-May-1917   Medicare Important Message Given:  Yes    Louanne Belton 06/05/2015, 12:14 PMImportant Message  Patient Details  Name: Sarah Perkins MRN: [id number] Date of Birth: Jun 16, 1917   Medicare Important Message Given:  Yes    Saadiya Wilfong G 06/05/2015, 12:13 PM

## 2015-06-05 NOTE — Progress Notes (Signed)
[facility] Progress Note  Subjective:  Denies abdominal pain.  No sign of bleeding; no BM since initially when she came in.  Received 1 unit PRBC's yesterday for Hgb of 7.7 grams.  Hgb improved to 9.7 grams today.  Objective:  Vital signs in last 24 hours: Temp:  [97.8 F (36.6 C)-98.7 F (37.1 C)] 97.8 F (36.6 C) (02/06 0506) Pulse Rate:  [89-97] 97 (02/06 0506) Resp:  [16-18] 18 (02/06 0506) BP: (138-152)/(45-65) 152/65 mmHg (02/06 0506) SpO2:  [92 %-95 %] 94 % (02/06 0506) Last BM Date: 06/04/15 General:  Alert, thin, in NAD Heart:  Regular rate and rhythm; no murmurs Pulm:  CTAB.  No W/R/R. Abdomen:  Soft, non-distended.  BS present.  Non-tender. Extremities:  Without edema. Neurologic:  Alert, pleasantly confused;  grossly normal neurologically.  Intake/Output from previous day: 02/05 0701 - 02/06 0700 In: 695 [P.O.:360; Blood:335] Out: -   Lab Results:  Recent Labs  06/03/15 0251 06/04/15 0238 06/05/15 0830  WBC 7.2 9.5 7.3  HGB 8.1* 7.7* 9.7*  HCT 24.8* 23.4* 28.7*  PLT 226 215 220   BMET  Recent Labs  06/03/15 0251 06/04/15 0238 06/05/15 0830  NA 142 136 138  K 3.4* 3.2* 3.5  CL 109 104 104  CO2 25 23 25   GLUCOSE 86 107* 97  BUN 23* 13 9  CREATININE 0.80 0.75 0.78  CALCIUM 8.7* 8.1* 8.5*   Ir Fluoro Guide Cv Line Left  06/05/2015  CLINICAL DATA:  82 year old female with a history of duodenum ulcer. She has been referred for PICC placement. EXAM: PICC PLACEMENT WITH ULTRASOUND AND FLUOROSCOPY LIMITED LEFT UPPER EXTREMITY VENOGRAM FLUOROSCOPY TIME:  4 MINUTES 18 SECONDS seconds TECHNIQUE: After written informed consent was obtained, patient was placed in the supine position on angiographic table. Patency of the left brachial vein was confirmed with ultrasound with image documentation. An appropriate skin site was determined. Skin site was marked. Region was prepped using maximum barrier technique including cap and mask, sterile gown,  sterile gloves, large sterile sheet, and Chlorhexidine as cutaneous antisepsis. The region was infiltrated locally with 1% lidocaine. Under real-time ultrasound guidance, the left brachial vein was accessed with a 21 gauge micropuncture needle; the needle tip within the vein was confirmed with ultrasound image documentation. Needle exchanged over a 018 guidewire for a peel-away sheath. Guidewire was found to only past the level of the axilla. Limited venogram of the left upper extremity was performed with infusion of small amount of contrast. Guidewire was then passed centrally. A 5-French double-lumen power injectable PICC trimmed to 45cm was advanced, positioned with its tip near the cavoatrial junction. Spot chest radiograph confirms appropriate catheter position. Catheter was flushed per protocol and secured externally with a StatLock. Patient tolerated the procedure well and remained hemodynamically stable throughout. No complications were encountered and no significant blood loss was encountered. COMPLICATIONS: None FINDINGS: Initial passage of the wire was unsuccessful, with a wire passing no further than the axilla. Limited venogram of the left upper extremity was performed demonstrating spasm or occlusion of the cephalic arch. Patency of the brachial vein, axillary vein, subclavian vein was demonstrated. After the venogram was performed, wire was passed centrally via the brachial vein. Tip terminates at the superior vena cava. IMPRESSION: Status post limited venogram left upper extremity, with placement of double-lumen power injectable PICC measuring 45 cm. Catheter ready for use. Signed, Dulcy Fanny. Earleen Newport, DO [facility] Electronically Signed   By:  Corrie Mckusick D.O.   On: 06/05/2015 11:22   Ir US Guide Vasc Access Left  06/05/2015  CLINICAL DATA:  82 year old female with a history of duodenum ulcer. She has been referred for PICC placement. EXAM:  PICC PLACEMENT WITH ULTRASOUND AND FLUOROSCOPY LIMITED LEFT UPPER EXTREMITY VENOGRAM FLUOROSCOPY TIME:  4 MINUTES 18 SECONDS seconds TECHNIQUE: After written informed consent was obtained, patient was placed in the supine position on angiographic table. Patency of the left brachial vein was confirmed with ultrasound with image documentation. An appropriate skin site was determined. Skin site was marked. Region was prepped using maximum barrier technique including cap and mask, sterile gown, sterile gloves, large sterile sheet, and Chlorhexidine as cutaneous antisepsis. The region was infiltrated locally with 1% lidocaine. Under real-time ultrasound guidance, the left brachial vein was accessed with a 21 gauge micropuncture needle; the needle tip within the vein was confirmed with ultrasound image documentation. Needle exchanged over a 018 guidewire for a peel-away sheath. Guidewire was found to only past the level of the axilla. Limited venogram of the left upper extremity was performed with infusion of small amount of contrast. Guidewire was then passed centrally. A 5-French double-lumen power injectable PICC trimmed to 45cm was advanced, positioned with its tip near the cavoatrial junction. Spot chest radiograph confirms appropriate catheter position. Catheter was flushed per protocol and secured externally with a StatLock. Patient tolerated the procedure well and remained hemodynamically stable throughout. No complications were encountered and no significant blood loss was encountered. COMPLICATIONS: None FINDINGS: Initial passage of the wire was unsuccessful, with a wire passing no further than the axilla. Limited venogram of the left upper extremity was performed demonstrating spasm or occlusion of the cephalic arch. Patency of the brachial vein, axillary vein, subclavian vein was demonstrated. After the venogram was performed, wire was passed centrally via the brachial vein. Tip terminates at the superior vena  cava. IMPRESSION: Status post limited venogram left upper extremity, with placement of double-lumen power injectable PICC measuring 45 cm. Catheter ready for use. Signed, Dulcy Fanny. Earleen Newport, DO [facility] Electronically Signed   By: Corrie Mckusick D.O.   On: 06/05/2015 11:22   Assessment / Plan: 1) GERD/Large walled off perforated duodenal ulcer on CT scan-Patient seems to be doing well on PPIs, clear liquid diet.  Would continue BID PPI.  Can advance diet to full liquids then to soft diet as tolerated.  No plans for EGD unless she were to have sign of active bleeding as there is risk of further perforation during the procedure.  Avoid NSAID's. 2) Acute blood loss anemia secondary to #1:  S/p one unit PRBC's 2/5. 3) Colonic diverticulosis. 4) Chronic protein calorie malnutrition and failure to thrive.    LOS: 3 days   ZEHR, JESSICA D.  06/05/2015, 11:37 AM  Pager number [phone]    Attending physician's note   I have taken an interval history, reviewed the chart and examined the patient. I agree with the Advanced Practitioner's note, impression and recommendations.  Duodenal ulcer likely contained perforation based on CT. Patient is doing better overall. Tolerating by mouth intake of clear liquids. Slowly advance diet as tolerated. Continue PPI twice a day. Hemoglobin was slightly lower yesterday but has briskly responded to one unit pRBC transfusion. No sign of overt GI bleed We will hold off diagnostic EGD at this point given patient is asymptomatic and no overt GI bleed which would require endoscopic therapeutic intervention. She is at high risk  for complication related to sedation and procedure. Avoid NSAID's. Check h.pylori  and treat if positive We'll sign off, please call with any questions  K Denzil Magnuson, MD [phone] Mon-Fri 8a-5p [phone] after 5p, weekends, holidays

## 2015-06-05 NOTE — NC FL2 (Signed)
[place] LEVEL OF CARE SCREENING TOOL     IDENTIFICATION  Patient Name: Sarah Perkins Birthdate: 1917-08-16 Sex: female Admission Date (Current Location): 06/01/2015  [facility] and [place] Number:  Herbalist and Address:  [facility] [place], [place] [address], [place],  [address]      Provider Number: [id number]  Attending Physician Name and Address:  Elmarie Shiley, MD  Relative Name and Phone Number:  Coraline, [place] [phone] or [phone]    Current Level of Care: Hospital Recommended Level of Care: [place] Prior Approval Number:    Date Approved/Denied:   PASRR Number: [id number]  Discharge Plan: SNF    Current Diagnoses: Patient Active Problem List   Diagnosis Date Noted  . Duodenal ulcer, acute 06/04/2015  . Essential hypertension 06/02/2015  . Syncope 06/02/2015  . Abdominal pain 06/02/2015  . Elevated lactic acid level 06/02/2015  . Faintness   . Dehydration 03/29/2015  . FTT (failure to thrive) in adult 03/29/2015  . Pressure ulcer 03/23/2015  . CKD (chronic kidney disease) stage 2, GFR 60-89 ml/min 03/23/2015  . Duodenitis without bleeding 03/22/2015  . Azotemia 03/22/2015  . Acute hypokalemia 03/22/2015  . Constipation   . Cough   . Palliative care encounter   . Anemia, iron deficiency 10/08/2014  . Rectal bleeding 08/21/2014  . Chronic diastolic CHF (congestive heart failure) ([place]) 02/22/2014  . GIB (gastrointestinal bleeding) 02/14/2014  . Abnormal EKG 02/14/2014  . Dementia 01/28/2013  . Acute posthemorrhagic anemia 09/20/2011  . Hypertensive heart disease with CHF (congestive heart failure) ([place]) 05/15/2011  . Acute on chronic kidney failure ([place]), stage ii 05/15/2011  . GERD (gastroesophageal reflux disease) 05/15/2011  . Diverticulosis 05/15/2011    Orientation RESPIRATION BLADDER Height & Weight     Self, Place  Normal Incontinent Weight: 125 lb 8 oz  (56.926 kg) Height:  5\' 5"  (165.1 cm)  BEHAVIORAL SYMPTOMS/MOOD NEUROLOGICAL BOWEL NUTRITION STATUS      Continent Diet (Regular)  AMBULATORY STATUS COMMUNICATION OF NEEDS Skin   Limited Assist Verbally Normal                       Personal Care Assistance Level of Assistance  Bathing, Dressing Bathing Assistance: Limited assistance   Dressing Assistance: Limited assistance     Functional Limitations Info             SPECIAL CARE FACTORS FREQUENCY  PT (By licensed PT)     PT Frequency: 5x  a week OT Frequency: 5x a week            Contractures      Additional Factors Info  Code Status, Allergies Code Status Info: DNR Allergies Info: NKA           Current Medications (06/05/2015):  This is the current hospital active medication list Current Facility-Administered Medications  Medication Dose Route Frequency Provider Last Rate Last Dose  .  stroke: mapping our early stages of recovery book   Does not apply Once Ivor Costa, MD      . 0.9 %  sodium chloride infusion   Intravenous Continuous Vena Rua, PA-C 20 mL/hr at 06/05/15 1310    . ciprofloxacin (CIPRO) IVPB 400 mg  400 mg Intravenous Q12H Belkys A Regalado, MD   400 mg at 06/05/15 1311  . docusate sodium (COLACE) capsule 100 mg  100 mg Oral Daily PRN Ivor Costa, MD      .  guaifenesin (ROBITUSSIN) 100 MG/5ML syrup 200 mg  200 mg Oral TID PRN Ivor Costa, MD      . hydrALAZINE (APRESOLINE) injection 5 mg  5 mg Intravenous Q2H PRN Ivor Costa, MD      . lidocaine (XYLOCAINE) 1 % (with pres) injection           . metroNIDAZOLE (FLAGYL) IVPB 250 mg  250 mg Intravenous Q8H Belkys A Regalado, MD   250 mg at 06/05/15 1312  . morphine 2 MG/ML injection 1 mg  1 mg Intravenous Q4H PRN Ivor Costa, MD      . ondansetron [facility]) injection 4 mg  4 mg Intravenous Q8H PRN Ivor Costa, MD      . pantoprazole (PROTONIX) injection 40 mg  40 mg Intravenous Q12H Vena Rua, PA-C   40 mg at 06/05/15 1132  . sodium chloride  flush (NS) 0.9 % injection 10-40 mL  10-40 mL Intracatheter Q12H Belkys A Regalado, MD      . sodium chloride flush (NS) 0.9 % injection 10-40 mL  10-40 mL Intracatheter PRN Belkys A Regalado, MD   10 mL at 06/05/15 1552     Discharge Medications: Please see discharge summary for a list of discharge medications.  Relevant Imaging Results:  Relevant Lab Results:   Additional Information SSN is [id number]  Anell Barr

## 2015-06-05 NOTE — Clinical Documentation Improvement (Signed)
[facility] Medicine [facility]ology  Consult 2/5 states "acute blood loss anemia". Other documentation states "Anemia due to Iron deficiency and blood loss for GIB".  If you agree with Consult's Note please add to next Note and DS to avoid conflicting documentation.   Acute Blood Loss Anemia  Acute on chronic blood loss anemiaChronic blood loss anemia, including the suspected or known cause or associated condition(s)  Precipitous drop in Hematocrit, including the suspected or known cause or associated condition(s)  Other  Clinically Undetermined  Supporting Information: -- H/H: 2/2 10.9/33.1,  2/3 9.2/27.7,  2/4 8.1/24.8,  2/5 7.7/23.4 -- Transfused 1u PRBC 2/5 --  Perforated acute duodenal ulcer  Please exercise your independent, professional judgment when responding. A specific answer is not anticipated or expected.  Thank You,  Ezekiel Ina RN [place] [phone]

## 2015-06-05 NOTE — Progress Notes (Signed)
[facility] PROGRESS NOTE  Sarah Perkins [id number] DOB: 16-Sep-1917 DOA: 06/01/2015 PCP: Thressa Sheller, MD  Assessment/Plan: HPI: Sarah Perkins is a 82 y.o. female with PMH of GI bleeding due to diverticular, diverticulitis, dementia, duodenitis, hypertension, diastolic congestive heart failure, GERD, cerebral aneurysm repair 1970, who presents with syncope, abdominal pain and rectal bleeding.  Abdominal pain: CT showed duodenal ulcer with inflammation. ? Perforation  - IV Protonix  BID -Avoid NSAIDs -lipase negative.  -GI consulted.  -Plan to advance diet today. No plan for endoscopy unless active bleeding.  -IV Cipro and flagyl.   Syncope: suspect vasovagal.  -trop x 3 negative -2d echo; normal EF, mild diastolic dysfunction   Questionable atrial fibrillation with RVR: This is reported by EMS, however EKG in ED showed sinus rhythm with occasional PAC. Patient is not a candidate for blood thinner due to GI bleeding. -EKG sinus rhythm  -Follow-up troponin 3 and 2-D echo normal EF  Acute blood loss anemia: GI bleed,: FOBT positive. Hemodynamically stable. 09/22/2011 colonoscopy showed pandiverticulosis - Protonix IV  - Zofran IV for nausea - Avoid NSAIDs and SQ heparin -Received one unit PRBC 2-05. -Hb at 9.7  GERD: -Protonix IV -Pepcid   Elevated lactic acid level : likely due to combination of syncope and worsening renal Fx. Patient does not have signs of infection, unlikely to have sepsis. -IVFas above  Chronic diastolic CHF: 2-D echo [id number] showed EF 60-65% with grade 2 diastolic dysfunction. Patient is on Lasix 20 mg daily at home. No leg edema. CHF is compensated. -Hold lasix due to worsening renal function Will NSL fluids.   Acute blood loss anemia, Anemia due to Iron deficiency and blood loss for GBI: -continue iron supplement -Follow-up by [facility] following. Received one unit PRBC 2-05. Hb at 9 today.    Dementia:  -f/u  SLP -PT/OT  Hypertension   -hold amlodipine  -hold lasix due to worsening renal function -IV hydralazine when necessary   AoCKD-II: Baseline creatinine 0.6-0.9. Her cre is 1.01 and BUN 27 on admission. Likely due to prerenal secondary to dehydration and continuation of diuretics. - IVF as above - Follow up renal function by BMP - Hold Diuretics  Code Status: DNR Family Communication: son at bedside.  Disposition Plan: remain inpatient for tx ulcer.    Consultants:  Surgery   GI  Procedures:  ECHO; NL EF  Antibiotics:  HPI/Subjective: Resting in bed, no new complaints. Denies abdominal pain   Objective: Filed Vitals:   06/04/15 2056 06/05/15 0506  BP: 139/64 152/65  Pulse: 93 97  Temp: 98.1 F (36.7 C) 97.8 F (36.6 C)  Resp: 18 18    Intake/Output Summary (Last 24 hours) at 06/05/15 1317 Last data filed at 06/04/15 1430  Gross per 24 hour  Intake    360 ml  Output      0 ml  Net    360 ml   Filed Weights   06/01/15 2050 06/02/15 0128  Weight: 58.968 kg (130 lb) 56.926 kg (125 lb 8 oz)    Exam:   General:  NAD  Cardiovascular: S 1, S 2 RRR  Respiratory: CTA  Abdomen: bs present, soft, nt  Musculoskeletal: no edema  Data Reviewed: Basic Metabolic Panel:  Recent Labs Lab 06/01/15 2102 06/02/15 1141 06/03/15 0251 06/04/15 0238 06/05/15 0830  NA 138 144 142 136 138  K 4.2 3.7 3.4* 3.2* 3.5  CL 98* 110 109 104 104  CO2 29 27 25 23 25   GLUCOSE 151*  96 86 107* 97  BUN 27* 25* 23* 13 9  CREATININE 1.10* 0.87 0.80 0.75 0.78  CALCIUM 10.1 8.9 8.7* 8.1* 8.5*   Liver Function Tests: No results for input(s): AST, ALT, ALKPHOS, BILITOT, PROT, ALBUMIN in the last 168 hours.  Recent Labs Lab 06/02/15 0110  LIPASE 16   No results for input(s): AMMONIA in the last 168 hours. CBC:  Recent Labs Lab 06/02/15 0110 06/02/15 0652 06/03/15 0251 06/04/15 0238 06/05/15 0830  WBC 10.8* 9.1 7.2 9.5 7.3  HGB 9.8* 9.2* 8.1* 7.7* 9.7*   HCT 29.7* 27.7* 24.8* 23.4* 28.7*  MCV 92.5 91.4 91.9 91.1 88.9  PLT 236 246 226 215 220   Cardiac Enzymes:  Recent Labs Lab 06/01/15 2124 06/02/15 0110 06/02/15 0652 06/02/15 1141  CKTOTAL  --  60  --   --   TROPONINI <0.03 <0.03 <0.03 <0.03   BNP (last 3 results)  Recent Labs  10/08/14 1025 06/02/15 0516  BNP 98.5 61.4    ProBNP (last 3 results) No results for input(s): PROBNP in the last 8760 hours.  CBG:  Recent Labs Lab 06/01/15 2106  GLUCAP 129*    Recent Results (from the past 240 hour(s))  MRSA PCR Screening     Status: None   Collection Time: 06/02/15  3:16 PM  Result Value Ref Range Status   MRSA by PCR NEGATIVE NEGATIVE Final    Comment:        The GeneXpert MRSA Assay (FDA approved for NASAL specimens only), is one component of a comprehensive MRSA colonization surveillance program. It is not intended to diagnose MRSA infection nor to guide or monitor treatment for MRSA infections.      Studies: Ir Fluoro Guide Cv Line Left  06/05/2015  CLINICAL DATA:  82 year old female with a history of duodenum ulcer. She has been referred for PICC placement. EXAM: PICC PLACEMENT WITH ULTRASOUND AND FLUOROSCOPY LIMITED LEFT UPPER EXTREMITY VENOGRAM FLUOROSCOPY TIME:  4 MINUTES 18 SECONDS seconds TECHNIQUE: After written informed consent was obtained, patient was placed in the supine position on angiographic table. Patency of the left brachial vein was confirmed with ultrasound with image documentation. An appropriate skin site was determined. Skin site was marked. Region was prepped using maximum barrier technique including cap and mask, sterile gown, sterile gloves, large sterile sheet, and Chlorhexidine as cutaneous antisepsis. The region was infiltrated locally with 1% lidocaine. Under real-time ultrasound guidance, the left brachial vein was accessed with a 21 gauge micropuncture needle; the needle tip within the vein was confirmed with ultrasound image  documentation. Needle exchanged over a 018 guidewire for a peel-away sheath. Guidewire was found to only past the level of the axilla. Limited venogram of the left upper extremity was performed with infusion of small amount of contrast. Guidewire was then passed centrally. A 5-French double-lumen power injectable PICC trimmed to 45cm was advanced, positioned with its tip near the cavoatrial junction. Spot chest radiograph confirms appropriate catheter position. Catheter was flushed per protocol and secured externally with a StatLock. Patient tolerated the procedure well and remained hemodynamically stable throughout. No complications were encountered and no significant blood loss was encountered. COMPLICATIONS: None FINDINGS: Initial passage of the wire was unsuccessful, with a wire passing no further than the axilla. Limited venogram of the left upper extremity was performed demonstrating spasm or occlusion of the cephalic arch. Patency of the brachial vein, axillary vein, subclavian vein was demonstrated. After the venogram was performed, wire was passed centrally via the brachial vein.  Tip terminates at the superior vena cava. IMPRESSION: Status post limited venogram left upper extremity, with placement of double-lumen power injectable PICC measuring 45 cm. Catheter ready for use. Signed, Dulcy Fanny. Earleen Newport, DO [facility] Radiology Electronically Signed   By: Corrie Mckusick D.O.   On: 06/05/2015 11:22   Ir US Guide Vasc Access Left  06/05/2015  CLINICAL DATA:  82 year old female with a history of duodenum ulcer. She has been referred for PICC placement. EXAM: PICC PLACEMENT WITH ULTRASOUND AND FLUOROSCOPY LIMITED LEFT UPPER EXTREMITY VENOGRAM FLUOROSCOPY TIME:  4 MINUTES 18 SECONDS seconds TECHNIQUE: After written informed consent was obtained, patient was placed in the supine position on angiographic table. Patency of the left brachial vein was confirmed with  ultrasound with image documentation. An appropriate skin site was determined. Skin site was marked. Region was prepped using maximum barrier technique including cap and mask, sterile gown, sterile gloves, large sterile sheet, and Chlorhexidine as cutaneous antisepsis. The region was infiltrated locally with 1% lidocaine. Under real-time ultrasound guidance, the left brachial vein was accessed with a 21 gauge micropuncture needle; the needle tip within the vein was confirmed with ultrasound image documentation. Needle exchanged over a 018 guidewire for a peel-away sheath. Guidewire was found to only past the level of the axilla. Limited venogram of the left upper extremity was performed with infusion of small amount of contrast. Guidewire was then passed centrally. A 5-French double-lumen power injectable PICC trimmed to 45cm was advanced, positioned with its tip near the cavoatrial junction. Spot chest radiograph confirms appropriate catheter position. Catheter was flushed per protocol and secured externally with a StatLock. Patient tolerated the procedure well and remained hemodynamically stable throughout. No complications were encountered and no significant blood loss was encountered. COMPLICATIONS: None FINDINGS: Initial passage of the wire was unsuccessful, with a wire passing no further than the axilla. Limited venogram of the left upper extremity was performed demonstrating spasm or occlusion of the cephalic arch. Patency of the brachial vein, axillary vein, subclavian vein was demonstrated. After the venogram was performed, wire was passed centrally via the brachial vein. Tip terminates at the superior vena cava. IMPRESSION: Status post limited venogram left upper extremity, with placement of double-lumen power injectable PICC measuring 45 cm. Catheter ready for use. Signed, Dulcy Fanny. Earleen Newport, DO [facility] Radiology Electronically Signed   By: Corrie Mckusick  D.O.   On: 06/05/2015 11:22    Scheduled Meds: .  stroke: mapping our early stages of recovery book   Does not apply Once  . ciprofloxacin  400 mg Intravenous Q12H  . lidocaine      . metronidazole  250 mg Intravenous Q8H  . pantoprazole (PROTONIX) IV  40 mg Intravenous Q12H   Continuous Infusions: . sodium chloride 20 mL/hr at 06/05/15 1310    Active Problems:   Acute on chronic kidney failure (HCC), stage ii   GERD (gastroesophageal reflux disease)   Dementia   GIB (gastrointestinal bleeding)   Chronic diastolic CHF (congestive heart failure) (HCC)   Anemia, iron deficiency   Essential hypertension   Syncope   Abdominal pain   Elevated lactic acid level   Duodenal ulcer, acute    Time spent: 25 minutes.     Niel Hummer A  [facility] Pager [phone]. If 7PM-7AM, please contact night-coverage at [email or web address], password [facility] 06/05/2015, 1:17 PM  LOS: 3 days

## 2015-06-06 ENCOUNTER — Encounter (HOSPITAL_COMMUNITY)
Admission: EM | Disposition: A | Discharge status: Skilled Nursing Facility | Payer: Self-pay | Source: Home / Self Care | Attending: Internal Medicine

## 2015-06-06 LAB — CBC
HEMATOCRIT: 27.6 % — AB (ref 36.0–46.0)
HEMOGLOBIN: 9 g/dL — AB (ref 12.0–15.0)
MCH: 28.7 pg (ref 26.0–34.0)
MCHC: 32.6 g/dL (ref 30.0–36.0)
MCV: 87.9 fL (ref 78.0–100.0)
Platelets: 213 10*3/uL (ref 150–400)
RBC: 3.14 MIL/uL — AB (ref 3.87–5.11)
RDW: 16.1 % — ABNORMAL HIGH (ref 11.5–15.5)
WBC: 8.6 10*3/uL (ref 4.0–10.5)

## 2015-06-06 LAB — BASIC METABOLIC PANEL
ANION GAP: 7 (ref 5–15)
BUN: 11 mg/dL (ref 6–20)
CHLORIDE: 105 mmol/L (ref 101–111)
CO2: 23 mmol/L (ref 22–32)
Calcium: 8.1 mg/dL — ABNORMAL LOW (ref 8.9–10.3)
Creatinine, Ser: 0.7 mg/dL (ref 0.44–1.00)
GFR calc non Af Amer: >60 mL/min (ref >60)
Glucose, Bld: 117 mg/dL — ABNORMAL HIGH (ref 65–99)
Potassium: 3.6 mmol/L (ref 3.5–5.1)
Sodium: 135 mmol/L (ref 135–145)

## 2015-06-06 LAB — TYPE AND SCREEN
ABO/RH(D): B POS
ANTIBODY SCREEN: NEGATIVE
Donor AG Type: NEGATIVE
Donor AG Type: NEGATIVE
Unit division: 0
Unit division: 0

## 2015-06-06 LAB — H PYLORI, IGM, IGG, IGA AB: H Pylori IgG: 1.6 U/mL — ABNORMAL HIGH (ref 0.0–0.8)

## 2015-06-06 SURGERY — EGD (ESOPHAGOGASTRODUODENOSCOPY)
Anesthesia: Moderate Sedation

## 2015-06-06 NOTE — Discharge Instructions (Signed)
MRSA Infection, Adult MRSA stands for methicillin-resistant Staphylococcus aureus. This type of infection is caused by Staphylococcus aureus bacteria that are no longer affected by the medicines used to kill them (drug resistant). Staphylococcus (staph) bacteria are normally found on the skin or in the nose of healthy people. In most cases, these bacteria do not cause infection. But if these resistant bacteria enter your body through a cut or sore, they can cause a serious infection on your skin or in other parts of your body. There is a slight chance that the staph on your skin or in your nose is MRSA. There are two types of MRSA infections:  Hospital-acquired MRSA is bacteria that you get in the hospital.  Community-acquired MRSA is bacteria that you get somewhere other than in a hospital. RISK FACTORS Hospital-acquired MRSA is more common. You could be at risk for this infection if you are in the hospital and you:  Have surgery or a procedure.  Have an IV access or a catheter tube placed in your body.  Have weak resistance to germs (weakened immune system).  Are elderly.  Are on kidney dialysis. You could be at risk for community-acquired MRSA if you have a break in your skin and come into contact with MRSA. This may happen if you:  Play sports where there is skin-to-skin contact.  Live in a crowded setting, like a dormitory or a military barracks.  Share towels, razors, or sports equipment with other people. SYMPTOMS  Symptoms of hospital-acquired MRSA depend on where MRSA has spread. Symptoms may include:  Wound infection.  Skin infection.  Rash.  Pneumonia.  Fever and chills.  Difficulty breathing.  Chest pain. Community-acquired MRSA is most likely to start as a scratch or cut that becomes infected. Symptoms may include:  A pus-filled pimple.  A boil on your skin.  Pus draining from your skin.  A sore (abscess) under your skin or somewhere in your  body.  Fever with or without chills. DIAGNOSIS  The diagnosis of MRSA is made by taking a sample from an infected area and sending it to a lab for testing. A lab technician can grow (culture) MRSA and check it under a microscope. The cultured MRSA can be tested to see which type of antibiotic medicine will work to treat it. Newer tests can identify MRSA more quickly by testing bacteria samples for MRSA genes. Your health care provider can diagnose MRSA using samples from:   Cuts or wounds in infected areas.  Nasal swabs.  Saliva or cough specimens from deep in the lungs (sputum).  Urine.  Blood. You may also have:  Imaging studies (such as X-ray or MRI) to check if the infection has spread to the lungs, bones, or joints.  A culture and sensitivity test of blood or fluids from inside the joints. TREATMENT  Treatment depends on how severe, deep, or extensive the infection is. Very bad infections may require a hospital stay.  Some skin infections, such as a small boil or sore (abscess), may be treated by draining pus from the site of the infection.  More extensive surgery to drain pus may be necessary for deeper or more widespread soft tissue infections.  You may then have to take antibiotic medicine given by mouth or through a vein. You may start antibiotic treatment right away or after testing can be done to see what antibiotic medicine should be used. HOME CARE INSTRUCTIONS   Take your antibiotics as directed by your health care provider.   Take the medicine as prescribed until it is finished.  Avoid close contact with those around you as much as possible. Do not use towels, razors, toothbrushes, bedding, or other items that will be used by others.  Wash your hands frequently for 15 seconds with soap and water. Dry your hands with a clean or disposable towel.  When you are not able to wash your hands, use hand sanitizer that is more than 60 percent alcohol.  Wash towels, sheets,  or clothes in the washing machine with detergent and hot water. Dry them in a hot dryer.  Follow your health care provider's instructions for wound care. Wash your hands before and after changing your bandages.  Always shower after exercising.  Keep all cuts and scrapes clean and covered with a bandage.  Be sure to tell all your health care providers that you have MRSA so they are aware of your infection. SEEK MEDICAL CARE IF:  You have a cut, scrape, pimple, or boil that becomes red, swollen, or painful or has pus in it.  You have pus draining from your skin.  You have an abscess under your skin or somewhere in your body. SEEK IMMEDIATE MEDICAL CARE IF:   You have symptoms of a skin infection with a fever or chills.  You have trouble breathing.  You have chest pain.  You have a skin wound and you become nauseous or start vomiting. MAKE SURE YOU:  Understand these instructions.  Will watch your condition.  Will get help right away if you are not doing well or get worse.   This information is not intended to replace advice given to you by your health care provider. Make sure you discuss any questions you have with your health care provider.   Document Released: 04/15/2005 Document Revised: 08/30/2014 Document Reviewed: 02/05/2013 Elsevier Interactive Patient Education 2016 [employer]  

## 2015-06-06 NOTE — Progress Notes (Signed)
PT Cancellation Note  Patient Details Name: Sarah Perkins MRN: [id number] DOB: [birth year]   Cancelled Treatment:    Reason Eval/Treat Not Completed: Patient declined, no reason specified Pt refusing to participate in therapy this AM, "I am sick, I just don't feel like it" Tried to explain importance of mobility but pt refusing. Will follow up.   Marguarite Arbour A Juliene Kirsh 06/06/2015, 10:05 AM  Wray Kearns, PT, DPT [phone]

## 2015-06-06 NOTE — Progress Notes (Signed)
[facility] PROGRESS NOTE  Sarah Perkins [id number] DOB: 02-19-1918 DOA: 06/01/2015 PCP: Thressa Sheller, MD  Assessment/Plan: HPI: Sarah Perkins is a 82 y.o. female with PMH of GI bleeding due to diverticular, diverticulitis, dementia, duodenitis, hypertension, diastolic congestive heart failure, GERD, cerebral aneurysm repair 1970, who presents with syncope, abdominal pain and rectal bleeding. Patient was found to have a contain perforated duodenal ulcer.   Abdominal pain: CT showed duodenal ulcer with inflammation. ? Perforation  - IV Protonix  BID -Avoid NSAIDs -lipase negative.  -GI consulted.  -Plan to advance diet today to dysphagia 2. No plan for endoscopy unless active bleeding.  -IV Cipro and flagyl.  She will need to be discharge on oral PPI and Oral antibiotics.   Syncope: suspect vasovagal.  -trop x 3 negative -2d echo; normal EF, mild diastolic dysfunction   Questionable atrial fibrillation with RVR: This is reported by EMS, however EKG in ED showed sinus rhythm with occasional PAC. Patient is not a candidate for blood thinner due to GI bleeding. -EKG sinus rhythm  -Follow-up troponin 3 and 2-D echo normal EF  Acute blood loss anemia: GI bleed,: FOBT positive. Hemodynamically stable. 09/22/2011 colonoscopy showed pandiverticulosis - Protonix IV  - Zofran IV for nausea - Avoid NSAIDs and SQ heparin -Received one unit PRBC 2-05. -Hb at 9.7  GERD: -Protonix IV -Pepcid   Elevated lactic acid level : likely due to combination of syncope and worsening renal Fx. Patient does not have signs of infection, unlikely to have sepsis. -IVFas above  Chronic diastolic CHF: 2-D echo 99991111 showed EF 60-65% with grade 2 diastolic dysfunction. Patient is on Lasix 20 mg daily at home. No leg edema. CHF is compensated. -Hold lasix due to worsening renal function Will NSL fluids.   Acute blood loss anemia, Anemia due to Iron deficiency and blood loss  for GBI: -continue iron supplement -Follow-up by CBC GI following. Received one unit PRBC 2-05. Hb at 9 today.    Dementia:  -f/u SLP -PT/OT  Hypertension   -hold amlodipine  -hold lasix due to worsening renal function -IV hydralazine when necessary   AoCKD-II: Baseline creatinine 0.6-0.9. Her cre is 1.01 and BUN 27 on admission. Likely due to prerenal secondary to dehydration and continuation of diuretics. - IVF as above - Follow up renal function by BMP - Hold Diuretics  Code Status: DNR Family Communication: son at bedside.  Disposition Plan: remain inpatient for tx ulcer. Discharge in 24 to 48 hours if tolerates regular diet    Consultants:  Surgery   GI  Procedures:  ECHO; NL EF  Antibiotics:  HPI/Subjective: Denies abdominal pain   Objective: Filed Vitals:   06/05/15 2017 06/06/15 0632  BP: 122/49 137/58  Pulse: 94 97  Temp: 98.4 F (36.9 C) 98 F (36.7 C)  Resp: 18 17    Intake/Output Summary (Last 24 hours) at 06/06/15 1403 Last data filed at 06/06/15 1015  Gross per 24 hour  Intake    750 ml  Output      0 ml  Net    750 ml   Filed Weights   06/01/15 2050 06/02/15 0128  Weight: 58.968 kg (130 lb) 56.926 kg (125 lb 8 oz)    Exam:   General:  NAD  Cardiovascular: S 1, S 2 RRR  Respiratory: CTA  Abdomen: bs present, soft, nt  Musculoskeletal: no edema  Data Reviewed: Basic Metabolic Panel:  Recent Labs Lab 06/02/15 1141 06/03/15 0251 06/04/15 0238 06/05/15 0830 06/06/15 0417  NA 144 142 136 138 135  K 3.7 3.4* 3.2* 3.5 3.6  CL 110 109 104 104 105  CO2 27 25 23 25 23   GLUCOSE 96 86 107* 97 117*  BUN 25* 23* 13 9 11   CREATININE 0.87 0.80 0.75 0.78 0.70  CALCIUM 8.9 8.7* 8.1* 8.5* 8.1*   Liver Function Tests: No results for input(s): AST, ALT, ALKPHOS, BILITOT, PROT, ALBUMIN in the last 168 hours.  Recent Labs Lab 06/02/15 0110  LIPASE 16   No results for input(s): AMMONIA in the last 168  hours. CBC:  Recent Labs Lab 06/02/15 0652 06/03/15 0251 06/04/15 0238 06/05/15 0830 06/06/15 0417  WBC 9.1 7.2 9.5 7.3 8.6  HGB 9.2* 8.1* 7.7* 9.7* 9.0*  HCT 27.7* 24.8* 23.4* 28.7* 27.6*  MCV 91.4 91.9 91.1 88.9 87.9  PLT 246 226 215 220 213   Cardiac Enzymes:  Recent Labs Lab 06/01/15 2124 06/02/15 0110 06/02/15 0652 06/02/15 1141  CKTOTAL  --  60  --   --   TROPONINI <0.03 <0.03 <0.03 <0.03   BNP (last 3 results)  Recent Labs  10/08/14 1025 06/02/15 0516  BNP 98.5 61.4    ProBNP (last 3 results) No results for input(s): PROBNP in the last 8760 hours.  CBG:  Recent Labs Lab 06/01/15 2106  GLUCAP 129*    Recent Results (from the past 240 hour(s))  MRSA PCR Screening     Status: None   Collection Time: 06/02/15  3:16 PM  Result Value Ref Range Status   MRSA by PCR NEGATIVE NEGATIVE Final    Comment:        The GeneXpert MRSA Assay (FDA approved for NASAL specimens only), is one component of a comprehensive MRSA colonization surveillance program. It is not intended to diagnose MRSA infection nor to guide or monitor treatment for MRSA infections.      Studies: Ir Fluoro Guide Cv Line Left  06/05/2015  CLINICAL DATA:  82 year old female with a history of duodenum ulcer. She has been referred for PICC placement. EXAM: PICC PLACEMENT WITH ULTRASOUND AND FLUOROSCOPY LIMITED LEFT UPPER EXTREMITY VENOGRAM FLUOROSCOPY TIME:  4 MINUTES 18 SECONDS seconds TECHNIQUE: After written informed consent was obtained, patient was placed in the supine position on angiographic table. Patency of the left brachial vein was confirmed with ultrasound with image documentation. An appropriate skin site was determined. Skin site was marked. Region was prepped using maximum barrier technique including cap and mask, sterile gown, sterile gloves, large sterile sheet, and Chlorhexidine as cutaneous antisepsis. The region was infiltrated locally with 1% lidocaine. Under real-time  ultrasound guidance, the left brachial vein was accessed with a 21 gauge micropuncture needle; the needle tip within the vein was confirmed with ultrasound image documentation. Needle exchanged over a 018 guidewire for a peel-away sheath. Guidewire was found to only past the level of the axilla. Limited venogram of the left upper extremity was performed with infusion of small amount of contrast. Guidewire was then passed centrally. A 5-French double-lumen power injectable PICC trimmed to 45cm was advanced, positioned with its tip near the cavoatrial junction. Spot chest radiograph confirms appropriate catheter position. Catheter was flushed per protocol and secured externally with a StatLock. Patient tolerated the procedure well and remained hemodynamically stable throughout. No complications were encountered and no significant blood loss was encountered. COMPLICATIONS: None FINDINGS: Initial passage of the wire was unsuccessful, with a wire passing no further than the axilla. Limited venogram of the left upper extremity was performed demonstrating spasm  or occlusion of the cephalic arch. Patency of the brachial vein, axillary vein, subclavian vein was demonstrated. After the venogram was performed, wire was passed centrally via the brachial vein. Tip terminates at the superior vena cava. IMPRESSION: Status post limited venogram left upper extremity, with placement of double-lumen power injectable PICC measuring 45 cm. Catheter ready for use. Signed, Dulcy Fanny. Earleen Newport, DO [facility] Electronically Signed   By: Corrie Mckusick D.O.   On: 06/05/2015 11:22   Ir US Guide Vasc Access Left  06/05/2015  CLINICAL DATA:  82 year old female with a history of duodenum ulcer. She has been referred for PICC placement. EXAM: PICC PLACEMENT WITH ULTRASOUND AND FLUOROSCOPY LIMITED LEFT UPPER EXTREMITY VENOGRAM FLUOROSCOPY TIME:  4 MINUTES 18 SECONDS seconds TECHNIQUE: After  written informed consent was obtained, patient was placed in the supine position on angiographic table. Patency of the left brachial vein was confirmed with ultrasound with image documentation. An appropriate skin site was determined. Skin site was marked. Region was prepped using maximum barrier technique including cap and mask, sterile gown, sterile gloves, large sterile sheet, and Chlorhexidine as cutaneous antisepsis. The region was infiltrated locally with 1% lidocaine. Under real-time ultrasound guidance, the left brachial vein was accessed with a 21 gauge micropuncture needle; the needle tip within the vein was confirmed with ultrasound image documentation. Needle exchanged over a 018 guidewire for a peel-away sheath. Guidewire was found to only past the level of the axilla. Limited venogram of the left upper extremity was performed with infusion of small amount of contrast. Guidewire was then passed centrally. A 5-French double-lumen power injectable PICC trimmed to 45cm was advanced, positioned with its tip near the cavoatrial junction. Spot chest radiograph confirms appropriate catheter position. Catheter was flushed per protocol and secured externally with a StatLock. Patient tolerated the procedure well and remained hemodynamically stable throughout. No complications were encountered and no significant blood loss was encountered. COMPLICATIONS: None FINDINGS: Initial passage of the wire was unsuccessful, with a wire passing no further than the axilla. Limited venogram of the left upper extremity was performed demonstrating spasm or occlusion of the cephalic arch. Patency of the brachial vein, axillary vein, subclavian vein was demonstrated. After the venogram was performed, wire was passed centrally via the brachial vein. Tip terminates at the superior vena cava. IMPRESSION: Status post limited venogram left upper extremity, with placement of double-lumen power injectable PICC measuring 45 cm. Catheter  ready for use. Signed, Dulcy Fanny. Earleen Newport, DO [facility] Electronically Signed   By: Corrie Mckusick D.O.   On: 06/05/2015 11:22    Scheduled Meds: .  stroke: mapping our early stages of recovery book   Does not apply Once  . ciprofloxacin  400 mg Intravenous Q12H  . metronidazole  250 mg Intravenous Q8H  . pantoprazole (PROTONIX) IV  40 mg Intravenous Q12H  . sodium chloride flush  10-40 mL Intracatheter Q12H   Continuous Infusions: . sodium chloride 20 mL/hr at 06/05/15 1310    Active Problems:   Acute on chronic kidney failure (HCC), stage ii   GERD (gastroesophageal reflux disease)   Dementia   GIB (gastrointestinal bleeding)   Chronic diastolic CHF (congestive heart failure) (HCC)   Anemia, iron deficiency   Essential hypertension   Syncope   Abdominal pain   Elevated lactic acid level   Duodenal ulcer, acute    Time spent: 25 minutes.     Wilson Sample A  [facility] Pager [phone]. If 7PM-7AM, please contact night-coverage at [email or web address], password [facility] 06/06/2015, 2:03 PM  LOS: 4 days

## 2015-06-07 DIAGNOSIS — F0390 Unspecified dementia without behavioral disturbance: Secondary | ICD-10-CM

## 2015-06-07 DIAGNOSIS — D509 Iron deficiency anemia, unspecified: Secondary | ICD-10-CM

## 2015-06-07 LAB — BASIC METABOLIC PANEL
Anion gap: 5 (ref 5–15)
BUN: 8 mg/dL (ref 6–20)
CALCIUM: 8.1 mg/dL — AB (ref 8.9–10.3)
CO2: 25 mmol/L (ref 22–32)
CREATININE: 0.73 mg/dL (ref 0.44–1.00)
Chloride: 108 mmol/L (ref 101–111)
GFR calc Af Amer: >60 mL/min (ref >60)
GLUCOSE: 114 mg/dL — AB (ref 65–99)
Potassium: 3.5 mmol/L (ref 3.5–5.1)
SODIUM: 138 mmol/L (ref 135–145)

## 2015-06-07 LAB — CBC
HCT: 25.7 % — ABNORMAL LOW (ref 36.0–46.0)
Hemoglobin: 8.4 g/dL — ABNORMAL LOW (ref 12.0–15.0)
MCH: 29 pg (ref 26.0–34.0)
MCHC: 32.7 g/dL (ref 30.0–36.0)
MCV: 88.6 fL (ref 78.0–100.0)
PLATELETS: 198 10*3/uL (ref 150–400)
RBC: 2.9 MIL/uL — AB (ref 3.87–5.11)
RDW: 16.1 % — AB (ref 11.5–15.5)
WBC: 6.2 10*3/uL (ref 4.0–10.5)

## 2015-06-07 MED ORDER — POTASSIUM CHLORIDE CRYS ER 20 MEQ PO TBCR
40.0000 meq | EXTENDED_RELEASE_TABLET | Freq: Once | ORAL | Status: AC
  Administered 2015-06-07: 40 meq via ORAL
  Filled 2015-06-07: qty 2

## 2015-06-07 NOTE — Progress Notes (Signed)
[facility] PROGRESS NOTE  Sarah Perkins [id number] DOB: December 24, 1917 DOA: 06/01/2015 PCP: Thressa Sheller, MD  Assessment/Plan: HPI: Sarah Perkins is a 82 y.o. female with PMH of GI bleeding due to diverticular, diverticulitis, dementia, duodenitis, hypertension, diastolic congestive heart failure, GERD, cerebral aneurysm repair 1970, who presents with syncope, abdominal pain and rectal bleeding. Patient was found to have a contain perforated duodenal ulcer.   Abdominal pain: CT showed duodenal ulcer with inflammation. ? Perforation  - IV Protonix  BID -Avoid NSAIDs -lipase negative.  -GI consulted.  -Plan to advance diet today to dysphagia 2. No plan for endoscopy unless active bleeding.  -IV Cipro and flagyl.  She will need to be discharge on oral PPI and Oral antibiotics.   Syncope: suspect vasovagal.  -trop x 3 negative -2d echo; normal EF, mild diastolic dysfunction   Questionable atrial fibrillation with RVR: This is reported by EMS, however EKG in ED showed sinus rhythm with occasional PAC. Patient is not a candidate for blood thinner due to GI bleeding. -EKG sinus rhythm  -Follow-up troponin 3 and 2-D echo normal EF  Acute blood loss anemia: GI bleed,: FOBT positive. Hemodynamically stable. 09/22/2011 colonoscopy showed pandiverticulosis - Protonix IV  - Zofran IV for nausea - Avoid NSAIDs and SQ heparin -Received one unit PRBC 2-05. -Hb at 9.7  GERD: -Protonix IV -Pepcid   Elevated lactic acid level : likely due to combination of syncope and worsening renal Fx. Patient does not have signs of infection, unlikely to have sepsis. -IVFas above  Chronic diastolic CHF: 2-D echo 99991111 showed EF 60-65% with grade 2 diastolic dysfunction. Patient is on Lasix 20 mg daily at home. No leg edema. CHF is compensated. -Hold lasix due to worsening renal function Will NSL fluids.   Acute blood loss anemia, Anemia due to Iron deficiency and blood loss  for GBI: -continue iron supplement -Follow-up by CBC GI following. Received one unit PRBC 2-05. Hb at 9 today.    Dementia:  -f/u SLP -PT/OT  Hypertension   -hold amlodipine  -hold lasix due to worsening renal function -IV hydralazine when necessary   AoCKD-II: Baseline creatinine 0.6-0.9. Her cre is 1.01 and BUN 27 on admission. Likely due to prerenal secondary to dehydration and continuation of diuretics. - IVF as above - Follow up renal function by BMP - Hold Diuretics  Code Status: DNR Family Communication: son at bedside.  Disposition Plan: remain inpatient for tx ulcer. Discharge in 24 to 48 hours if tolerates regular diet    Consultants:  Surgery   GI  Procedures:  ECHO; NL EF  Antibiotics:  HPI/Subjective: Denies abdominal pain , no n/v. Does not want to get up for physical therapy , son in room  Objective: Filed Vitals:   06/06/15 2204 06/07/15 0436  BP: 125/49 160/60  Pulse: 83 84  Temp: 98.3 F (36.8 C)   Resp: 18     Intake/Output Summary (Last 24 hours) at 06/07/15 1054 Last data filed at 06/07/15 0800  Gross per 24 hour  Intake    480 ml  Output      0 ml  Net    480 ml   Filed Weights   06/01/15 2050 06/02/15 0128  Weight: 58.968 kg (130 lb) 56.926 kg (125 lb 8 oz)    Exam:   General:  NAD, baseline dementia  Cardiovascular: S 1, S 2 RRR  Respiratory: CTA  Abdomen: bs present, soft, nt  Musculoskeletal: no edema  Data Reviewed: Basic Metabolic Panel:  Recent Labs Lab 06/03/15 0251 06/04/15 0238 06/05/15 0830 06/06/15 0417 06/07/15 0330  NA 142 136 138 135 138  K 3.4* 3.2* 3.5 3.6 3.5  CL 109 104 104 105 108  CO2 25 23 25 23 25   GLUCOSE 86 107* 97 117* 114*  BUN 23* 13 9 11 8   CREATININE 0.80 0.75 0.78 0.70 0.73  CALCIUM 8.7* 8.1* 8.5* 8.1* 8.1*   Liver Function Tests: No results for input(s): AST, ALT, ALKPHOS, BILITOT, PROT, ALBUMIN in the last 168 hours.  Recent Labs Lab 06/02/15 0110  LIPASE 16    No results for input(s): AMMONIA in the last 168 hours. CBC:  Recent Labs Lab 06/03/15 0251 06/04/15 0238 06/05/15 0830 06/06/15 0417 06/07/15 0330  WBC 7.2 9.5 7.3 8.6 6.2  HGB 8.1* 7.7* 9.7* 9.0* 8.4*  HCT 24.8* 23.4* 28.7* 27.6* 25.7*  MCV 91.9 91.1 88.9 87.9 88.6  PLT 226 215 220 213 198   Cardiac Enzymes:  Recent Labs Lab 06/01/15 2124 06/02/15 0110 06/02/15 0652 06/02/15 1141  CKTOTAL  --  60  --   --   TROPONINI <0.03 <0.03 <0.03 <0.03   BNP (last 3 results)  Recent Labs  10/08/14 1025 06/02/15 0516  BNP 98.5 61.4    ProBNP (last 3 results) No results for input(s): PROBNP in the last 8760 hours.  CBG:  Recent Labs Lab 06/01/15 2106  GLUCAP 129*    Recent Results (from the past 240 hour(s))  MRSA PCR Screening     Status: None   Collection Time: 06/02/15  3:16 PM  Result Value Ref Range Status   MRSA by PCR NEGATIVE NEGATIVE Final    Comment:        The GeneXpert MRSA Assay (FDA approved for NASAL specimens only), is one component of a comprehensive MRSA colonization surveillance program. It is not intended to diagnose MRSA infection nor to guide or monitor treatment for MRSA infections.      Studies: No results found.  Scheduled Meds: .  stroke: mapping our early stages of recovery book   Does not apply Once  . ciprofloxacin  400 mg Intravenous Q12H  . metronidazole  250 mg Intravenous Q8H  . pantoprazole (PROTONIX) IV  40 mg Intravenous Q12H  . potassium chloride  40 mEq Oral Once  . sodium chloride flush  10-40 mL Intracatheter Q12H   Continuous Infusions: . sodium chloride 20 mL/hr at 06/05/15 1310    Active Problems:   Acute on chronic kidney failure (HCC), stage ii   GERD (gastroesophageal reflux disease)   Dementia   GIB (gastrointestinal bleeding)   Chronic diastolic CHF (congestive heart failure) (HCC)   Anemia, iron deficiency   Essential hypertension   Syncope   Abdominal pain   Elevated lactic acid level    Duodenal ulcer, acute    Time spent: 25 minutes.     Yuriko Portales MD PhD  [facility] Pager [phone]. If 7PM-7AM, please contact night-coverage at [email or web address], password [facility] 06/07/2015, 10:54 AM  LOS: 5 days

## 2015-06-07 NOTE — Progress Notes (Signed)
Assumed care from Junris Parsons, RN. 

## 2015-06-07 NOTE — Progress Notes (Signed)
Son at the bed side assisting and encouraging patient to eat - he is supportive and encouraging.

## 2015-06-07 NOTE — Progress Notes (Signed)
CSW spoke with son concerning facility options- he is still interested in pt going to [facility] can accept pt when medically stable (anticipate DC tomorrow) if pt son pays of current outstanding balance  CSW will continue to follow  Domenica Reamer, [place] Worker [phone]

## 2015-06-08 DIAGNOSIS — N189 Chronic kidney disease, unspecified: Secondary | ICD-10-CM

## 2015-06-08 DIAGNOSIS — K263 Acute duodenal ulcer without hemorrhage or perforation: Secondary | ICD-10-CM | POA: Diagnosis not present

## 2015-06-08 DIAGNOSIS — N179 Acute kidney failure, unspecified: Secondary | ICD-10-CM

## 2015-06-08 DIAGNOSIS — M6281 Muscle weakness (generalized): Secondary | ICD-10-CM | POA: Diagnosis not present

## 2015-06-08 DIAGNOSIS — K254 Chronic or unspecified gastric ulcer with hemorrhage: Secondary | ICD-10-CM

## 2015-06-08 DIAGNOSIS — E872 Acidosis: Secondary | ICD-10-CM

## 2015-06-08 DIAGNOSIS — R5381 Other malaise: Secondary | ICD-10-CM | POA: Diagnosis not present

## 2015-06-08 DIAGNOSIS — K219 Gastro-esophageal reflux disease without esophagitis: Secondary | ICD-10-CM | POA: Diagnosis not present

## 2015-06-08 DIAGNOSIS — D649 Anemia, unspecified: Secondary | ICD-10-CM | POA: Diagnosis not present

## 2015-06-08 DIAGNOSIS — I10 Essential (primary) hypertension: Secondary | ICD-10-CM | POA: Diagnosis not present

## 2015-06-08 DIAGNOSIS — D500 Iron deficiency anemia secondary to blood loss (chronic): Secondary | ICD-10-CM | POA: Diagnosis not present

## 2015-06-08 DIAGNOSIS — R52 Pain, unspecified: Secondary | ICD-10-CM | POA: Diagnosis not present

## 2015-06-08 DIAGNOSIS — N182 Chronic kidney disease, stage 2 (mild): Secondary | ICD-10-CM | POA: Diagnosis not present

## 2015-06-08 DIAGNOSIS — R2681 Unsteadiness on feet: Secondary | ICD-10-CM | POA: Diagnosis not present

## 2015-06-08 DIAGNOSIS — I5032 Chronic diastolic (congestive) heart failure: Secondary | ICD-10-CM

## 2015-06-08 DIAGNOSIS — R109 Unspecified abdominal pain: Secondary | ICD-10-CM | POA: Diagnosis not present

## 2015-06-08 DIAGNOSIS — I480 Paroxysmal atrial fibrillation: Secondary | ICD-10-CM | POA: Diagnosis not present

## 2015-06-08 DIAGNOSIS — I4891 Unspecified atrial fibrillation: Secondary | ICD-10-CM | POA: Diagnosis not present

## 2015-06-08 DIAGNOSIS — I509 Heart failure, unspecified: Secondary | ICD-10-CM | POA: Diagnosis not present

## 2015-06-08 DIAGNOSIS — K922 Gastrointestinal hemorrhage, unspecified: Secondary | ICD-10-CM | POA: Diagnosis not present

## 2015-06-08 DIAGNOSIS — R55 Syncope and collapse: Secondary | ICD-10-CM

## 2015-06-08 DIAGNOSIS — D509 Iron deficiency anemia, unspecified: Secondary | ICD-10-CM | POA: Diagnosis not present

## 2015-06-08 DIAGNOSIS — R1311 Dysphagia, oral phase: Secondary | ICD-10-CM | POA: Diagnosis not present

## 2015-06-08 DIAGNOSIS — R740 Nonspecific elevation of levels of transaminase and lactic acid dehydrogenase [LDH]: Secondary | ICD-10-CM | POA: Diagnosis not present

## 2015-06-08 DIAGNOSIS — F0390 Unspecified dementia without behavioral disturbance: Secondary | ICD-10-CM | POA: Diagnosis not present

## 2015-06-08 DIAGNOSIS — B9681 Helicobacter pylori [H. pylori] as the cause of diseases classified elsewhere: Secondary | ICD-10-CM | POA: Diagnosis not present

## 2015-06-08 LAB — BASIC METABOLIC PANEL
ANION GAP: 7 (ref 5–15)
BUN: 5 mg/dL — ABNORMAL LOW (ref 6–20)
CALCIUM: 8.2 mg/dL — AB (ref 8.9–10.3)
CO2: 26 mmol/L (ref 22–32)
CREATININE: 0.69 mg/dL (ref 0.44–1.00)
Chloride: 104 mmol/L (ref 101–111)
Glucose, Bld: 101 mg/dL — ABNORMAL HIGH (ref 65–99)
Potassium: 3.9 mmol/L (ref 3.5–5.1)
Sodium: 137 mmol/L (ref 135–145)

## 2015-06-08 LAB — CBC
HEMATOCRIT: 26.7 % — AB (ref 36.0–46.0)
Hemoglobin: 8.4 g/dL — ABNORMAL LOW (ref 12.0–15.0)
MCH: 28.1 pg (ref 26.0–34.0)
MCHC: 31.5 g/dL (ref 30.0–36.0)
MCV: 89.3 fL (ref 78.0–100.0)
PLATELETS: 232 10*3/uL (ref 150–400)
RBC: 2.99 MIL/uL — ABNORMAL LOW (ref 3.87–5.11)
RDW: 15.9 % — AB (ref 11.5–15.5)
WBC: 6.1 10*3/uL (ref 4.0–10.5)

## 2015-06-08 LAB — MAGNESIUM: Magnesium: 1.8 mg/dL (ref 1.7–2.4)

## 2015-06-08 MED ORDER — DOXYCYCLINE HYCLATE 100 MG PO CAPS: 100.0000 mg | ORAL_CAPSULE | Freq: Two times a day (BID) | ORAL | Status: DC

## 2015-06-08 MED ORDER — METRONIDAZOLE 500 MG PO TABS: 500.0000 mg | ORAL_TABLET | Freq: Three times a day (TID) | ORAL | Status: DC

## 2015-06-08 MED ORDER — BISMUTH SUBSALICYLATE 262 MG/15ML PO SUSP: 30.0000 mL | Freq: Three times a day (TID) | ORAL | Status: AC

## 2015-06-08 MED ORDER — FUROSEMIDE 20 MG PO TABS: 20.0000 mg | ORAL_TABLET | Freq: Every day | ORAL | Status: DC | PRN

## 2015-06-08 MED ORDER — PANTOPRAZOLE SODIUM 40 MG PO TBEC
40.0000 mg | DELAYED_RELEASE_TABLET | Freq: Every day | ORAL | Status: AC
Start: 2015-06-08 — End: ?

## 2015-06-08 MED ORDER — PANTOPRAZOLE SODIUM 40 MG PO TBEC
40.0000 mg | DELAYED_RELEASE_TABLET | Freq: Every day | ORAL | Status: DC
  Administered 2015-06-08: 40 mg via ORAL
  Filled 2015-06-08: qty 1

## 2015-06-08 NOTE — Progress Notes (Signed)
Report called to [facility] - spoke with Pamala Hurry, RN; all questions answered.

## 2015-06-08 NOTE — Care Management Note (Signed)
Case Management Note Marvetta Gibbons RN, BSN Unit 2W-Case Manager [phone]  Patient Details  Name: Sarah Perkins MRN: [id number] Date of Birth: [birth year]  Subjective/Objective:   Pt admitted with duodenal ulcer with inflammation. ? Perforation                Action/Plan: PTA pt lived at home- per PT/OT recommendations plan for SNF- CSW following for placement needs-   Expected Discharge Date:    06/08/15              Expected Discharge Plan:  Skilled Nursing Facility  In-House Referral:  Clinical Social Work  Discharge planning Services  CM Consult  Post Acute Care Choice:  NA Choice offered to:  NA  DME Arranged:    DME Agency:     HH Arranged:    [place] Agency:     Status of Service:  Completed, signed off  Medicare Important Message Given:  Yes Date Medicare IM Given:    Medicare IM give by:    Date Additional Medicare IM Given:    Additional Medicare Important Message give by:     If discussed at [place] of Stay Meetings, dates discussed:  06/08/15  Discharge Disposition: skilled facility   Additional Comments:  06/08/15- pt for d/c today- tolerating diet- per CSW plan for [facility]- CSW following for placement needs.  Dawayne Patricia, RN 06/08/2015, 10:36 AM

## 2015-06-08 NOTE — Clinical Social Work Placement (Signed)
   CLINICAL SOCIAL WORK PLACEMENT  NOTE  Date:  06/08/2015  Patient Details  Name: Sarah Perkins MRN: [id number] Date of Birth: 1917/10/15  Clinical Social Work is seeking post-discharge placement for this patient at the [place] level of care (*CSW will initial, date and re-position this form in  chart as items are completed):  Yes   Patient/family provided with [place] Work Department's list of facilities offering this level of care within the geographic area requested by the patient (or if unable, by the patient's family).  Yes   Patient/family informed of their freedom to choose among providers that offer the needed level of care, that participate in Medicare, Medicaid or managed care program needed by the patient, have an available bed and are willing to accept the patient.  Yes   Patient/family informed of [facility]'s ownership interest in [facility] and [facility], as well as of the fact that they are under no obligation to receive care at these facilities.  PASRR submitted to EDS on 06/05/15     PASRR number received on       Existing PASRR number confirmed on 06/05/15     FL2 transmitted to all facilities in geographic area requested by pt/family on 06/05/15     FL2 transmitted to all facilities within larger geographic area on       Patient informed that his/her managed care company has contracts with or will negotiate with certain facilities, including the following:            Patient/family informed of bed offers received.  Patient chooses bed at [facility]     Physician recommends and patient chooses bed at      Patient to be transferred to [facility] on 06/08/15.  Patient to be transferred to facility by       Patient family notified on 06/08/15 of transfer.  Name of family member notified:  [name] (son)     PHYSICIAN Please sign FL2, Please sign DNR     Additional Comment:     _______________________________________________ Cranford Mon, LCSW 06/08/2015, 2:21 PM

## 2015-06-08 NOTE — Care Management Important Message (Signed)
Important Message  Patient Details  Name: Sarah Perkins MRN: [id number] Date of Birth: August 05, 1917   Medicare Important Message Given:  Yes    Zell Doucette, Leroy Sea 06/08/2015, 8:10 AM

## 2015-06-08 NOTE — Discharge Summary (Signed)
Discharge Summary  Sarah Perkins [id number] DOB: 12/02/1917  PCP: Thressa Sheller, MD  Admit date: 06/01/2015 Discharge date: 06/08/2015  Time spent: >83mins  Recommendations for Outpatient Follow-up:  1. F/u with PMD at SNF, repeat cbc in a week, transfuse prbc prn to keep hgb >8.  Discharge Diagnoses:  Active Hospital Problems   Diagnosis Date Noted  . Duodenal ulcer, acute 06/04/2015  . Essential hypertension 06/02/2015  . Syncope 06/02/2015  . Abdominal pain 06/02/2015  . Elevated lactic acid level 06/02/2015  . Anemia, iron deficiency 10/08/2014  . Chronic diastolic CHF (congestive heart failure) ([place]) 02/22/2014  . GIB (gastrointestinal bleeding) 02/14/2014  . Dementia 01/28/2013  . GERD (gastroesophageal reflux disease) 05/15/2011  . Acute on chronic kidney failure [facility]), stage ii 05/15/2011    Resolved Hospital Problems   Diagnosis Date Noted Date Resolved  No resolved problems to display.    Discharge Condition: stable  Diet recommendation: dysphagia diet 2, thin liquid, heart healthy, aspiration precaution  Filed Weights   06/01/15 2050 06/02/15 0128  Weight: 58.968 kg (130 lb) 56.926 kg (125 lb 8 oz)    History of present illness:  Sarah Perkins is a 82 y.o. female with PMH of GI bleeding due to diverticular, diverticulitis, dementia, duodenitis, hypertension, diastolic congestive heart failure, GERD, cerebral aneurysm repair 1970, who presents with syncope, abdominal pain and rectal bleeding.  Per pt's son, pt passed out when she was having BM on the toilet at about 8:30 PM. She did no fall from toilet, but was unresponsive. Her son noted some blood mixed with the stool in toilet. Pt complains of abdominal pain over lower abdomen, particularly over LLQ. Son reports that patient had nausea, and mild vomiting intermittently in the past several days. Her son also reported that pt had one episode of difficult speaking on Saturday, but no vision  change, hearing loss or unilateral weakness. Patient does not have diarrhea, fever or chills. Per EMS reports, pt has agonal RR (4/min) upon EMS and pt was bagged until regained consciousness. Pt was in afib RVR en route and her CBG was 206. Pt moves all extremities. No chest pain, shortness of breath, symptoms of UTI or unilateral weakness.  In ED, patient was found to have positive FOBT, hemoglobin 9.1 on 03/25/15-->10.9, lactate 2.6, troponin negative, urinalysis with trace amount of leukocytes but no WBC, temperature normal, no tachycardia, slightly worsening renal function, negative chest x-ray. Pending CT abdomen/pelvis. Patient is admitted to inpatient for further evaluation treatment.  Hospital Course:  Active Problems:   Acute on chronic kidney failure (HCC), stage ii   GERD (gastroesophageal reflux disease)   Dementia   GIB (gastrointestinal bleeding)   Chronic diastolic CHF (congestive heart failure) (HCC)   Anemia, iron deficiency   Essential hypertension   Syncope   Abdominal pain   Elevated lactic acid level   Duodenal ulcer, acute  Abdominal pain: CT showed duodenal ulcer with inflammation. ? Perforation  - IV Protonix BID -Avoid NSAIDs -lipase negative.  -GI and general surgery consulted , both recommended conservative management, no EGD, continue ppi and treat h pylori (tested positive for igg) -tolerate advance diet today to dysphagia 2.  -received IV Cipro and flagyl in the hospital,  Discharged on flagyl/doxycycline/bismuth and ppi.    Syncope: suspect vasovagal.  -trop x 3 negative -2d echo on 2/3; normal EF, mild diastolic dysfunction   Questionable atrial fibrillation with RVR: This is reported by EMS, however EKG in ED showed sinus rhythm with occasional PAC.  Patient is not a candidate for blood thinner due to GI bleeding. -EKG sinus rhythm  -Follow-up troponin 3 and 2-D echo normal EF  Acute blood loss anemia: GI bleed,: FOBT positive. Hemodynamically  stable. 09/22/2011 colonoscopy showed pandiverticulosis - Protonix IV  - Zofran IV for nausea - Avoid NSAIDs and SQ heparin -Received one unit PRBC 2-05. -Hb stable at discharge Continue iron supplement  GERD: -Protonix IV -Pepcid   Elevated lactic acid level : likely due to combination of syncope and worsening renal Fx. Patient does not have signs of infection, unlikely to have sepsis. -IVFas above, resolved  Chronic diastolic CHF:  2-D echo 99991111 showed EF 60-65% with grade 2 diastolic dysfunction. Patient is on Lasix 20 mg daily at home. No leg edema. CHF is compensated. -Hold lasix due to worsening renal function   Acute blood loss anemia, Anemia due to Iron deficiency and blood loss for GBI: -continue iron supplement -Follow-up by [facility] GI consulted and recommended conservative management, did not recommend EGD. Marland Kitchen Received one unit PRBC 2-05. .    Dementia:  -f/u SLP, dysphagia 2diet  , continue aspiration precaution -PT/OT To SNF  Hypertension   -amlodipine and lasix held in the hospital -bp stable, norvasc resumed at discharge, lasix changed to prn  AoCKD-II: Baseline creatinine 0.6-0.9. Her cr is 1.1 on  and BUN 27 on admission. Likely due to prerenal secondary to dehydration and continuation of diuretics. -received gentle hydration in the hospital - cr 0.69 at discharge -  Diuretics held in the hospital, changed to prn for edema at discharge  Code Status: DNR Family Communication: son at bedside.  Disposition Plan: d/c to SNF on 2/9   Consultants:  Surgery   GI  Procedures:  prbcx1 transfusion on 2/5  Antibiotics: cipro and flagyl in the hospital  Discharge Exam: BP 125/54 mmHg  Pulse 94  Temp(Src) 98.3 F (36.8 C) (Oral)  Resp 16  Ht 5\' 5"  (1.651 m)  Wt 56.926 kg (125 lb 8 oz)  BMI 20.88 kg/m2  SpO2 94%    General: NAD, baseline dementia  Cardiovascular: S 1, S 2 RRR  Respiratory: CTA  Abdomen: bs present, soft,  nt  Musculoskeletal: no edema  Discharge Instructions You were cared for by a hospitalist during your hospital stay. If you have any questions about your discharge medications or the care you received while you were in the hospital after you are discharged, you can call the unit and asked to speak with the hospitalist on call if the hospitalist that took care of you is not available. Once you are discharged, your primary care physician will handle any further medical issues. Please note that NO REFILLS for any discharge medications will be authorized once you are discharged, as it is imperative that you return to your primary care physician (or establish a relationship with a primary care physician if you do not have one) for your aftercare needs so that they can reassess your need for medications and monitor your lab values.  Discharge Instructions    Diet - low sodium heart healthy    Complete by:  As directed   Soft diet (dysphagia 2 diet), thin liquid, aspiration precaution.     Increase activity slowly    Complete by:  As directed             Medication List    TAKE these medications        amLODipine 5 MG tablet  Commonly known as:  NORVASC  Take 5 mg by mouth daily.     bismuth subsalicylate 99991111 [id number] suspension  Commonly known as:  PEPTO-BISMOL  Take 30 mLs by mouth 4 (four) times daily -  before meals and at bedtime.     Calcium-Magnesium-Vitamin D 185-50-100 MG-MG-UNIT Caps  Take 1 capsule by mouth daily.     cholecalciferol 1000 units tablet  Commonly known as:  VITAMIN D  Take 1,000 Units by mouth daily.     cyanocobalamin 500 MCG tablet  Take 1 tablet (500 mcg total) by mouth daily.     docusate sodium 100 MG capsule  Commonly known as:  COLACE  Take 100 mg by mouth at bedtime.     doxycycline 100 MG capsule  Commonly known as:  VIBRAMYCIN  Take 1 capsule (100 mg total) by mouth 2 (two) times daily.     ferrous sulfate 325 (65 FE) MG tablet  Take 325 mg  by mouth daily with breakfast.     furosemide 20 MG tablet  Commonly known as:  LASIX  Take 1 tablet (20 mg total) by mouth daily as needed for fluid or edema.     guaifenesin 100 MG/5ML syrup  Commonly known as:  ROBITUSSIN  Take 200 mg by mouth 3 (three) times daily as needed for cough.     metroNIDAZOLE 500 MG tablet  Commonly known as:  FLAGYL  Take 1 tablet (500 mg total) by mouth 3 (three) times daily.     pantoprazole 40 MG tablet  Commonly known as:  PROTONIX  Take 1 tablet (40 mg total) by mouth daily.     ranitidine 150 MG tablet  Commonly known as:  ZANTAC  Take 150 mg by mouth 2 (two) times daily as needed for heartburn.     vitamin C 100 MG tablet  Take 500 mg by mouth daily. With rose hips 500 mg       No Known Allergies    The results of significant diagnostics from this hospitalization (including imaging, microbiology, ancillary and laboratory) are listed below for reference.    Significant Diagnostic Studies: Ct Abdomen Pelvis W Contrast  06/02/2015  CLINICAL DATA:  Syncopal episode during bowel movement tonight. LEFT lower quadrant pain. History of hypertension, aneurysm, diverticulitis, GI bleed, colonic polyps. EXAM: CT ABDOMEN AND PELVIS WITH CONTRAST TECHNIQUE: Multidetector CT imaging of the abdomen and pelvis was performed using the standard protocol following bolus administration of intravenous contrast. CONTRAST:  73mL OMNIPAQUE IOHEXOL 300 MG/ML  SOLN COMPARISON:  CT abdomen and pelvis March 22, 2015 FINDINGS: LUNG BASES: Included view of the lung bases are clear. Visualized heart and pericardium are unremarkable. SOLID ORGANS: The liver, spleen, and adrenal glands are unremarkable. Multiple subcentimeter gallstones without CT findings of acute cholecystitis. Fatty replaced pancreas. GASTROINTESTINAL TRACT: Small fluid-filled apparent ulceration with inflammation duodenum bulb. Moderate hiatal hernia. Proximal duodenum diverticulum. The stomach, and  large bowel are normal in course and caliber without inflammatory changes. Severe colonic diverticulosis. Suspected interval appendectomy. KIDNEYS/ URINARY TRACT: Kidneys are orthotopic, demonstrating symmetric enhancement. No nephrolithiasis, hydronephrosis or solid renal masses. 1 cm LEFT lower pole cyst. The unopacified ureters are normal in course and caliber. Delayed imaging through the kidneys demonstrates symmetric prompt contrast excretion within the proximal urinary collecting system. Urinary bladder is partially distended and unremarkable. PERITONEUM/RETROPERITONEUM: Aortoiliac vessels are normal in course and caliber, moderate to severe calcific atherosclerosis. No lymphadenopathy by CT size criteria. Status post hysterectomy. No intraperitoneal free fluid nor free air. SOFT TISSUE/OSSEOUS STRUCTURES: Non-suspicious.  RIGHT hip total arthroplasty results in streak artifact. Old L1 moderate to severe compression fracture. Osteopenia. Moderate fat containing umbilical hernia. IMPRESSION: Suspected duodenal ulcer with surrounding inflammatory changes concerning for perforation without pneumoperitoneum. Again recommended is endoscopy. Severe colonic diverticulosis without CT findings of acute diverticulitis. Cholelithiasis without CT findings of acute cholecystitis. Electronically Signed   By: Elon Alas M.D.   On: 06/02/2015 01:09   Ir Fluoro Guide Cv Line Left  06/05/2015  CLINICAL DATA:  82 year old female with a history of duodenum ulcer. She has been referred for PICC placement. EXAM: PICC PLACEMENT WITH ULTRASOUND AND FLUOROSCOPY LIMITED LEFT UPPER EXTREMITY VENOGRAM FLUOROSCOPY TIME:  4 MINUTES 18 SECONDS seconds TECHNIQUE: After written informed consent was obtained, patient was placed in the supine position on angiographic table. Patency of the left brachial vein was confirmed with ultrasound with image documentation. An appropriate skin site was determined. Skin site was marked. Region was  prepped using maximum barrier technique including cap and mask, sterile gown, sterile gloves, large sterile sheet, and Chlorhexidine as cutaneous antisepsis. The region was infiltrated locally with 1% lidocaine. Under real-time ultrasound guidance, the left brachial vein was accessed with a 21 gauge micropuncture needle; the needle tip within the vein was confirmed with ultrasound image documentation. Needle exchanged over a 018 guidewire for a peel-away sheath. Guidewire was found to only past the level of the axilla. Limited venogram of the left upper extremity was performed with infusion of small amount of contrast. Guidewire was then passed centrally. A 5-French double-lumen power injectable PICC trimmed to 45cm was advanced, positioned with its tip near the cavoatrial junction. Spot chest radiograph confirms appropriate catheter position. Catheter was flushed per protocol and secured externally with a StatLock. Patient tolerated the procedure well and remained hemodynamically stable throughout. No complications were encountered and no significant blood loss was encountered. COMPLICATIONS: None FINDINGS: Initial passage of the wire was unsuccessful, with a wire passing no further than the axilla. Limited venogram of the left upper extremity was performed demonstrating spasm or occlusion of the cephalic arch. Patency of the brachial vein, axillary vein, subclavian vein was demonstrated. After the venogram was performed, wire was passed centrally via the brachial vein. Tip terminates at the superior vena cava. IMPRESSION: Status post limited venogram left upper extremity, with placement of double-lumen power injectable PICC measuring 45 cm. Catheter ready for use. Signed, Dulcy Fanny. Earleen Newport, DO [facility] Radiology Electronically Signed   By: Corrie Mckusick D.O.   On: 06/05/2015 11:22   Ir US Guide Vasc Access Left  06/05/2015  CLINICAL DATA:  82 year old female with  a history of duodenum ulcer. She has been referred for PICC placement. EXAM: PICC PLACEMENT WITH ULTRASOUND AND FLUOROSCOPY LIMITED LEFT UPPER EXTREMITY VENOGRAM FLUOROSCOPY TIME:  4 MINUTES 18 SECONDS seconds TECHNIQUE: After written informed consent was obtained, patient was placed in the supine position on angiographic table. Patency of the left brachial vein was confirmed with ultrasound with image documentation. An appropriate skin site was determined. Skin site was marked. Region was prepped using maximum barrier technique including cap and mask, sterile gown, sterile gloves, large sterile sheet, and Chlorhexidine as cutaneous antisepsis. The region was infiltrated locally with 1% lidocaine. Under real-time ultrasound guidance, the left brachial vein was accessed with a 21 gauge micropuncture needle; the needle tip within the vein was confirmed with ultrasound image documentation. Needle exchanged over a 018 guidewire for a peel-away sheath. Guidewire was found to only past the level of  the axilla. Limited venogram of the left upper extremity was performed with infusion of small amount of contrast. Guidewire was then passed centrally. A 5-French double-lumen power injectable PICC trimmed to 45cm was advanced, positioned with its tip near the cavoatrial junction. Spot chest radiograph confirms appropriate catheter position. Catheter was flushed per protocol and secured externally with a StatLock. Patient tolerated the procedure well and remained hemodynamically stable throughout. No complications were encountered and no significant blood loss was encountered. COMPLICATIONS: None FINDINGS: Initial passage of the wire was unsuccessful, with a wire passing no further than the axilla. Limited venogram of the left upper extremity was performed demonstrating spasm or occlusion of the cephalic arch. Patency of the brachial vein, axillary vein, subclavian vein was demonstrated. After the venogram was performed, wire  was passed centrally via the brachial vein. Tip terminates at the superior vena cava. IMPRESSION: Status post limited venogram left upper extremity, with placement of double-lumen power injectable PICC measuring 45 cm. Catheter ready for use. Signed, Dulcy Fanny. Earleen Newport, DO [facility] Radiology Electronically Signed   By: Corrie Mckusick D.O.   On: 06/05/2015 11:22   Dg Chest Portable 1 View  06/01/2015  CLINICAL DATA:  Syncope and weakness EXAM: PORTABLE CHEST 1 VIEW COMPARISON:  10/08/2014 FINDINGS: The heart size and mediastinal contours are within normal limits. Aortic calcifications noted. Small hiatal hernia noted. Both lungs are clear. The visualized skeletal structures are unremarkable. IMPRESSION: No active disease. Electronically Signed   By: Kerby Moors M.D.   On: 06/01/2015 21:59    Microbiology: Recent Results (from the past 240 hour(s))  MRSA PCR Screening     Status: None   Collection Time: 06/02/15  3:16 PM  Result Value Ref Range Status   MRSA by PCR NEGATIVE NEGATIVE Final    Comment:        The GeneXpert MRSA Assay (FDA approved for NASAL specimens only), is one component of a comprehensive MRSA colonization surveillance program. It is not intended to diagnose MRSA infection nor to guide or monitor treatment for MRSA infections.      Labs: Basic Metabolic Panel:  Recent Labs Lab 06/04/15 0238 06/05/15 0830 06/06/15 0417 06/07/15 0330 06/08/15 0430  NA 136 138 135 138 137  K 3.2* 3.5 3.6 3.5 3.9  CL 104 104 105 108 104  CO2 23 25 23 25 26   GLUCOSE 107* 97 117* 114* 101*  BUN 13 9 11 8  5*  CREATININE 0.75 0.78 0.70 0.73 0.69  CALCIUM 8.1* 8.5* 8.1* 8.1* 8.2*  MG  --   --   --   --  1.8   Liver Function Tests: No results for input(s): AST, ALT, ALKPHOS, BILITOT, PROT, ALBUMIN in the last 168 hours.  Recent Labs Lab 06/02/15 0110  LIPASE 16   No results for input(s): AMMONIA in the last 168  hours. CBC:  Recent Labs Lab 06/04/15 0238 06/05/15 0830 06/06/15 0417 06/07/15 0330 06/08/15 0430  WBC 9.5 7.3 8.6 6.2 6.1  HGB 7.7* 9.7* 9.0* 8.4* 8.4*  HCT 23.4* 28.7* 27.6* 25.7* 26.7*  MCV 91.1 88.9 87.9 88.6 89.3  PLT 215 220 213 198 232   Cardiac Enzymes:  Recent Labs Lab 06/01/15 2124 06/02/15 0110 06/02/15 0652 06/02/15 1141  CKTOTAL  --  60  --   --   TROPONINI <0.03 <0.03 <0.03 <0.03   BNP: BNP (last 3 results)  Recent Labs  10/08/14 1025 06/02/15 0516  BNP 98.5 61.4  ProBNP (last 3 results) No results for input(s): PROBNP in the last 8760 hours.  CBG:  Recent Labs Lab 06/01/15 2106  GLUCAP 129*       SignedFlorencia Reasons MD, PhD  [facility] 06/08/2015, 10:05 AM

## 2015-06-08 NOTE — Progress Notes (Signed)
Patient prepared by nursing tech to transfer to SNF. PTAR arrived, gave report to EMT - has been taken via stretcher to [facility] SNF. He son has left with her personal belongings and will meet his mother there.

## 2015-06-08 NOTE — Progress Notes (Signed)
Updated the patient's son, who is sitting in the room, including MD has changed her all her medications to PO vs IV, the PICC line will be removed by early afternoon and that PTAR will be transferring his mother/ patient to [facility] this afternoon. All questions answered.

## 2015-06-09 DIAGNOSIS — I509 Heart failure, unspecified: Secondary | ICD-10-CM | POA: Diagnosis not present

## 2015-06-09 DIAGNOSIS — I4891 Unspecified atrial fibrillation: Secondary | ICD-10-CM | POA: Diagnosis not present

## 2015-06-09 DIAGNOSIS — I10 Essential (primary) hypertension: Secondary | ICD-10-CM | POA: Diagnosis not present

## 2015-06-09 DIAGNOSIS — D649 Anemia, unspecified: Secondary | ICD-10-CM | POA: Diagnosis not present

## 2015-06-09 DIAGNOSIS — K922 Gastrointestinal hemorrhage, unspecified: Secondary | ICD-10-CM | POA: Diagnosis not present

## 2015-06-09 DIAGNOSIS — R5381 Other malaise: Secondary | ICD-10-CM | POA: Diagnosis not present

## 2015-07-07 DIAGNOSIS — D649 Anemia, unspecified: Secondary | ICD-10-CM | POA: Diagnosis not present

## 2015-07-07 DIAGNOSIS — I10 Essential (primary) hypertension: Secondary | ICD-10-CM | POA: Diagnosis not present

## 2015-07-07 DIAGNOSIS — I480 Paroxysmal atrial fibrillation: Secondary | ICD-10-CM | POA: Diagnosis not present

## 2015-07-07 DIAGNOSIS — I509 Heart failure, unspecified: Secondary | ICD-10-CM | POA: Diagnosis not present

## 2015-07-08 DIAGNOSIS — I130 Hypertensive heart and chronic kidney disease with heart failure and stage 1 through stage 4 chronic kidney disease, or unspecified chronic kidney disease: Secondary | ICD-10-CM | POA: Diagnosis not present

## 2015-07-08 DIAGNOSIS — F0390 Unspecified dementia without behavioral disturbance: Secondary | ICD-10-CM | POA: Diagnosis not present

## 2015-07-08 DIAGNOSIS — I5032 Chronic diastolic (congestive) heart failure: Secondary | ICD-10-CM | POA: Diagnosis not present

## 2015-07-08 DIAGNOSIS — M6281 Muscle weakness (generalized): Secondary | ICD-10-CM | POA: Diagnosis not present

## 2015-07-08 DIAGNOSIS — N183 Chronic kidney disease, stage 3 (moderate): Secondary | ICD-10-CM | POA: Diagnosis not present

## 2015-07-09 DIAGNOSIS — N183 Chronic kidney disease, stage 3 (moderate): Secondary | ICD-10-CM | POA: Diagnosis not present

## 2015-07-09 DIAGNOSIS — I5032 Chronic diastolic (congestive) heart failure: Secondary | ICD-10-CM | POA: Diagnosis not present

## 2015-07-09 DIAGNOSIS — I130 Hypertensive heart and chronic kidney disease with heart failure and stage 1 through stage 4 chronic kidney disease, or unspecified chronic kidney disease: Secondary | ICD-10-CM | POA: Diagnosis not present

## 2015-07-09 DIAGNOSIS — D509 Iron deficiency anemia, unspecified: Secondary | ICD-10-CM | POA: Diagnosis not present

## 2015-07-09 DIAGNOSIS — R2689 Other abnormalities of gait and mobility: Secondary | ICD-10-CM | POA: Diagnosis not present

## 2015-07-09 DIAGNOSIS — M6281 Muscle weakness (generalized): Secondary | ICD-10-CM | POA: Diagnosis not present

## 2015-07-11 DIAGNOSIS — M6281 Muscle weakness (generalized): Secondary | ICD-10-CM | POA: Diagnosis not present

## 2015-07-11 DIAGNOSIS — N183 Chronic kidney disease, stage 3 (moderate): Secondary | ICD-10-CM | POA: Diagnosis not present

## 2015-07-11 DIAGNOSIS — R2689 Other abnormalities of gait and mobility: Secondary | ICD-10-CM | POA: Diagnosis not present

## 2015-07-11 DIAGNOSIS — I5032 Chronic diastolic (congestive) heart failure: Secondary | ICD-10-CM | POA: Diagnosis not present

## 2015-07-11 DIAGNOSIS — I130 Hypertensive heart and chronic kidney disease with heart failure and stage 1 through stage 4 chronic kidney disease, or unspecified chronic kidney disease: Secondary | ICD-10-CM | POA: Diagnosis not present

## 2015-07-11 DIAGNOSIS — D509 Iron deficiency anemia, unspecified: Secondary | ICD-10-CM | POA: Diagnosis not present

## 2015-07-13 DIAGNOSIS — I130 Hypertensive heart and chronic kidney disease with heart failure and stage 1 through stage 4 chronic kidney disease, or unspecified chronic kidney disease: Secondary | ICD-10-CM | POA: Diagnosis not present

## 2015-07-13 DIAGNOSIS — D509 Iron deficiency anemia, unspecified: Secondary | ICD-10-CM | POA: Diagnosis not present

## 2015-07-13 DIAGNOSIS — N183 Chronic kidney disease, stage 3 (moderate): Secondary | ICD-10-CM | POA: Diagnosis not present

## 2015-07-13 DIAGNOSIS — I5032 Chronic diastolic (congestive) heart failure: Secondary | ICD-10-CM | POA: Diagnosis not present

## 2015-07-13 DIAGNOSIS — M6281 Muscle weakness (generalized): Secondary | ICD-10-CM | POA: Diagnosis not present

## 2015-07-13 DIAGNOSIS — R2689 Other abnormalities of gait and mobility: Secondary | ICD-10-CM | POA: Diagnosis not present

## 2015-07-18 DIAGNOSIS — R2689 Other abnormalities of gait and mobility: Secondary | ICD-10-CM | POA: Diagnosis not present

## 2015-07-18 DIAGNOSIS — N183 Chronic kidney disease, stage 3 (moderate): Secondary | ICD-10-CM | POA: Diagnosis not present

## 2015-07-18 DIAGNOSIS — D509 Iron deficiency anemia, unspecified: Secondary | ICD-10-CM | POA: Diagnosis not present

## 2015-07-18 DIAGNOSIS — M6281 Muscle weakness (generalized): Secondary | ICD-10-CM | POA: Diagnosis not present

## 2015-07-18 DIAGNOSIS — I130 Hypertensive heart and chronic kidney disease with heart failure and stage 1 through stage 4 chronic kidney disease, or unspecified chronic kidney disease: Secondary | ICD-10-CM | POA: Diagnosis not present

## 2015-07-18 DIAGNOSIS — I5032 Chronic diastolic (congestive) heart failure: Secondary | ICD-10-CM | POA: Diagnosis not present

## 2015-07-24 DIAGNOSIS — N183 Chronic kidney disease, stage 3 (moderate): Secondary | ICD-10-CM | POA: Diagnosis not present

## 2015-07-24 DIAGNOSIS — M6281 Muscle weakness (generalized): Secondary | ICD-10-CM | POA: Diagnosis not present

## 2015-07-24 DIAGNOSIS — I130 Hypertensive heart and chronic kidney disease with heart failure and stage 1 through stage 4 chronic kidney disease, or unspecified chronic kidney disease: Secondary | ICD-10-CM | POA: Diagnosis not present

## 2015-07-24 DIAGNOSIS — D509 Iron deficiency anemia, unspecified: Secondary | ICD-10-CM | POA: Diagnosis not present

## 2015-07-24 DIAGNOSIS — I5032 Chronic diastolic (congestive) heart failure: Secondary | ICD-10-CM | POA: Diagnosis not present

## 2015-07-24 DIAGNOSIS — R2689 Other abnormalities of gait and mobility: Secondary | ICD-10-CM | POA: Diagnosis not present

## 2015-07-26 DIAGNOSIS — I5032 Chronic diastolic (congestive) heart failure: Secondary | ICD-10-CM | POA: Diagnosis not present

## 2015-07-26 DIAGNOSIS — N183 Chronic kidney disease, stage 3 (moderate): Secondary | ICD-10-CM | POA: Diagnosis not present

## 2015-07-26 DIAGNOSIS — D509 Iron deficiency anemia, unspecified: Secondary | ICD-10-CM | POA: Diagnosis not present

## 2015-07-26 DIAGNOSIS — M6281 Muscle weakness (generalized): Secondary | ICD-10-CM | POA: Diagnosis not present

## 2015-07-26 DIAGNOSIS — I130 Hypertensive heart and chronic kidney disease with heart failure and stage 1 through stage 4 chronic kidney disease, or unspecified chronic kidney disease: Secondary | ICD-10-CM | POA: Diagnosis not present

## 2015-07-26 DIAGNOSIS — R2689 Other abnormalities of gait and mobility: Secondary | ICD-10-CM | POA: Diagnosis not present

## 2015-08-01 DIAGNOSIS — M6281 Muscle weakness (generalized): Secondary | ICD-10-CM | POA: Diagnosis not present

## 2015-08-01 DIAGNOSIS — R2689 Other abnormalities of gait and mobility: Secondary | ICD-10-CM | POA: Diagnosis not present

## 2015-08-01 DIAGNOSIS — I130 Hypertensive heart and chronic kidney disease with heart failure and stage 1 through stage 4 chronic kidney disease, or unspecified chronic kidney disease: Secondary | ICD-10-CM | POA: Diagnosis not present

## 2015-08-01 DIAGNOSIS — I5032 Chronic diastolic (congestive) heart failure: Secondary | ICD-10-CM | POA: Diagnosis not present

## 2015-08-02 DIAGNOSIS — I5032 Chronic diastolic (congestive) heart failure: Secondary | ICD-10-CM | POA: Diagnosis not present

## 2015-08-02 DIAGNOSIS — D509 Iron deficiency anemia, unspecified: Secondary | ICD-10-CM | POA: Diagnosis not present

## 2015-08-02 DIAGNOSIS — I130 Hypertensive heart and chronic kidney disease with heart failure and stage 1 through stage 4 chronic kidney disease, or unspecified chronic kidney disease: Secondary | ICD-10-CM | POA: Diagnosis not present

## 2015-08-02 DIAGNOSIS — M6281 Muscle weakness (generalized): Secondary | ICD-10-CM | POA: Diagnosis not present

## 2015-08-02 DIAGNOSIS — N183 Chronic kidney disease, stage 3 (moderate): Secondary | ICD-10-CM | POA: Diagnosis not present

## 2015-08-02 DIAGNOSIS — R2689 Other abnormalities of gait and mobility: Secondary | ICD-10-CM | POA: Diagnosis not present

## 2015-08-04 DIAGNOSIS — I5032 Chronic diastolic (congestive) heart failure: Secondary | ICD-10-CM | POA: Diagnosis not present

## 2015-08-04 DIAGNOSIS — I130 Hypertensive heart and chronic kidney disease with heart failure and stage 1 through stage 4 chronic kidney disease, or unspecified chronic kidney disease: Secondary | ICD-10-CM | POA: Diagnosis not present

## 2015-08-04 DIAGNOSIS — D509 Iron deficiency anemia, unspecified: Secondary | ICD-10-CM | POA: Diagnosis not present

## 2015-08-04 DIAGNOSIS — R2689 Other abnormalities of gait and mobility: Secondary | ICD-10-CM | POA: Diagnosis not present

## 2015-08-04 DIAGNOSIS — M6281 Muscle weakness (generalized): Secondary | ICD-10-CM | POA: Diagnosis not present

## 2015-08-04 DIAGNOSIS — N183 Chronic kidney disease, stage 3 (moderate): Secondary | ICD-10-CM | POA: Diagnosis not present

## 2015-08-09 DIAGNOSIS — I5032 Chronic diastolic (congestive) heart failure: Secondary | ICD-10-CM | POA: Diagnosis not present

## 2015-08-09 DIAGNOSIS — M6281 Muscle weakness (generalized): Secondary | ICD-10-CM | POA: Diagnosis not present

## 2015-08-09 DIAGNOSIS — D509 Iron deficiency anemia, unspecified: Secondary | ICD-10-CM | POA: Diagnosis not present

## 2015-08-09 DIAGNOSIS — I130 Hypertensive heart and chronic kidney disease with heart failure and stage 1 through stage 4 chronic kidney disease, or unspecified chronic kidney disease: Secondary | ICD-10-CM | POA: Diagnosis not present

## 2015-08-09 DIAGNOSIS — R2689 Other abnormalities of gait and mobility: Secondary | ICD-10-CM | POA: Diagnosis not present

## 2015-08-09 DIAGNOSIS — N183 Chronic kidney disease, stage 3 (moderate): Secondary | ICD-10-CM | POA: Diagnosis not present

## 2015-08-15 DIAGNOSIS — I5032 Chronic diastolic (congestive) heart failure: Secondary | ICD-10-CM | POA: Diagnosis not present

## 2015-08-15 DIAGNOSIS — M6281 Muscle weakness (generalized): Secondary | ICD-10-CM | POA: Diagnosis not present

## 2015-08-15 DIAGNOSIS — N183 Chronic kidney disease, stage 3 (moderate): Secondary | ICD-10-CM | POA: Diagnosis not present

## 2015-08-15 DIAGNOSIS — D509 Iron deficiency anemia, unspecified: Secondary | ICD-10-CM | POA: Diagnosis not present

## 2015-08-15 DIAGNOSIS — I130 Hypertensive heart and chronic kidney disease with heart failure and stage 1 through stage 4 chronic kidney disease, or unspecified chronic kidney disease: Secondary | ICD-10-CM | POA: Diagnosis not present

## 2015-08-15 DIAGNOSIS — R2689 Other abnormalities of gait and mobility: Secondary | ICD-10-CM | POA: Diagnosis not present

## 2015-08-17 DIAGNOSIS — D509 Iron deficiency anemia, unspecified: Secondary | ICD-10-CM | POA: Diagnosis not present

## 2015-08-17 DIAGNOSIS — M6281 Muscle weakness (generalized): Secondary | ICD-10-CM | POA: Diagnosis not present

## 2015-08-17 DIAGNOSIS — I5032 Chronic diastolic (congestive) heart failure: Secondary | ICD-10-CM | POA: Diagnosis not present

## 2015-08-17 DIAGNOSIS — R2689 Other abnormalities of gait and mobility: Secondary | ICD-10-CM | POA: Diagnosis not present

## 2015-08-17 DIAGNOSIS — I130 Hypertensive heart and chronic kidney disease with heart failure and stage 1 through stage 4 chronic kidney disease, or unspecified chronic kidney disease: Secondary | ICD-10-CM | POA: Diagnosis not present

## 2015-08-17 DIAGNOSIS — N183 Chronic kidney disease, stage 3 (moderate): Secondary | ICD-10-CM | POA: Diagnosis not present

## 2015-10-21 ENCOUNTER — Emergency Department (HOSPITAL_COMMUNITY): Payer: Medicare Other

## 2015-10-21 ENCOUNTER — Inpatient Hospital Stay (HOSPITAL_COMMUNITY)
Admission: EM | Admit: 2015-10-21 | Discharge: 2015-10-26 | DRG: 481 | Disposition: A | Discharge status: Skilled Nursing Facility | Payer: Medicare Other | Attending: Internal Medicine | Admitting: Internal Medicine

## 2015-10-21 ENCOUNTER — Other Ambulatory Visit: Payer: Self-pay

## 2015-10-21 ENCOUNTER — Encounter (HOSPITAL_COMMUNITY): Payer: Self-pay | Admitting: Emergency Medicine

## 2015-10-21 DIAGNOSIS — M542 Cervicalgia: Secondary | ICD-10-CM | POA: Diagnosis not present

## 2015-10-21 DIAGNOSIS — I130 Hypertensive heart and chronic kidney disease with heart failure and stage 1 through stage 4 chronic kidney disease, or unspecified chronic kidney disease: Secondary | ICD-10-CM | POA: Diagnosis present

## 2015-10-21 DIAGNOSIS — Z79899 Other long term (current) drug therapy: Secondary | ICD-10-CM

## 2015-10-21 DIAGNOSIS — K219 Gastro-esophageal reflux disease without esophagitis: Secondary | ICD-10-CM | POA: Diagnosis present

## 2015-10-21 DIAGNOSIS — N183 Chronic kidney disease, stage 3 (moderate): Secondary | ICD-10-CM | POA: Diagnosis present

## 2015-10-21 DIAGNOSIS — I509 Heart failure, unspecified: Secondary | ICD-10-CM | POA: Diagnosis not present

## 2015-10-21 DIAGNOSIS — N182 Chronic kidney disease, stage 2 (mild): Secondary | ICD-10-CM | POA: Diagnosis not present

## 2015-10-21 DIAGNOSIS — S72491A Other fracture of lower end of right femur, initial encounter for closed fracture: Secondary | ICD-10-CM | POA: Diagnosis not present

## 2015-10-21 DIAGNOSIS — F0390 Unspecified dementia without behavioral disturbance: Secondary | ICD-10-CM | POA: Diagnosis not present

## 2015-10-21 DIAGNOSIS — J9811 Atelectasis: Secondary | ICD-10-CM | POA: Diagnosis not present

## 2015-10-21 DIAGNOSIS — R05 Cough: Secondary | ICD-10-CM | POA: Diagnosis not present

## 2015-10-21 DIAGNOSIS — R52 Pain, unspecified: Secondary | ICD-10-CM

## 2015-10-21 DIAGNOSIS — E440 Moderate protein-calorie malnutrition: Secondary | ICD-10-CM | POA: Insufficient documentation

## 2015-10-21 DIAGNOSIS — S72451A Displaced supracondylar fracture without intracondylar extension of lower end of right femur, initial encounter for closed fracture: Secondary | ICD-10-CM | POA: Diagnosis not present

## 2015-10-21 DIAGNOSIS — Z6821 Body mass index (BMI) 21.0-21.9, adult: Secondary | ICD-10-CM | POA: Diagnosis not present

## 2015-10-21 DIAGNOSIS — S72401A Unspecified fracture of lower end of right femur, initial encounter for closed fracture: Secondary | ICD-10-CM | POA: Diagnosis present

## 2015-10-21 DIAGNOSIS — Z66 Do not resuscitate: Secondary | ICD-10-CM | POA: Diagnosis present

## 2015-10-21 DIAGNOSIS — S728X9A Other fracture of unspecified femur, initial encounter for closed fracture: Secondary | ICD-10-CM | POA: Diagnosis not present

## 2015-10-21 DIAGNOSIS — I5032 Chronic diastolic (congestive) heart failure: Secondary | ICD-10-CM | POA: Diagnosis present

## 2015-10-21 DIAGNOSIS — S0990XA Unspecified injury of head, initial encounter: Secondary | ICD-10-CM | POA: Diagnosis not present

## 2015-10-21 DIAGNOSIS — D72829 Elevated white blood cell count, unspecified: Secondary | ICD-10-CM | POA: Diagnosis present

## 2015-10-21 DIAGNOSIS — R0902 Hypoxemia: Secondary | ICD-10-CM | POA: Diagnosis present

## 2015-10-21 DIAGNOSIS — S72001F Fracture of unspecified part of neck of right femur, subsequent encounter for open fracture type IIIA, IIIB, or IIIC with routine healing: Secondary | ICD-10-CM | POA: Diagnosis not present

## 2015-10-21 DIAGNOSIS — Z993 Dependence on wheelchair: Secondary | ICD-10-CM | POA: Diagnosis not present

## 2015-10-21 DIAGNOSIS — R54 Age-related physical debility: Secondary | ICD-10-CM | POA: Diagnosis present

## 2015-10-21 DIAGNOSIS — R51 Headache: Secondary | ICD-10-CM | POA: Diagnosis not present

## 2015-10-21 DIAGNOSIS — M6281 Muscle weakness (generalized): Secondary | ICD-10-CM | POA: Diagnosis not present

## 2015-10-21 DIAGNOSIS — M25559 Pain in unspecified hip: Secondary | ICD-10-CM | POA: Diagnosis not present

## 2015-10-21 DIAGNOSIS — I10 Essential (primary) hypertension: Secondary | ICD-10-CM | POA: Diagnosis not present

## 2015-10-21 DIAGNOSIS — S199XXA Unspecified injury of neck, initial encounter: Secondary | ICD-10-CM | POA: Diagnosis not present

## 2015-10-21 DIAGNOSIS — R059 Cough, unspecified: Secondary | ICD-10-CM

## 2015-10-21 DIAGNOSIS — Z419 Encounter for procedure for purposes other than remedying health state, unspecified: Secondary | ICD-10-CM

## 2015-10-21 DIAGNOSIS — T148 Other injury of unspecified body region: Secondary | ICD-10-CM | POA: Diagnosis not present

## 2015-10-21 DIAGNOSIS — R2681 Unsteadiness on feet: Secondary | ICD-10-CM | POA: Diagnosis not present

## 2015-10-21 DIAGNOSIS — D62 Acute posthemorrhagic anemia: Secondary | ICD-10-CM | POA: Diagnosis not present

## 2015-10-21 DIAGNOSIS — S7291XS Unspecified fracture of right femur, sequela: Secondary | ICD-10-CM | POA: Diagnosis not present

## 2015-10-21 DIAGNOSIS — R55 Syncope and collapse: Secondary | ICD-10-CM | POA: Diagnosis present

## 2015-10-21 DIAGNOSIS — R1312 Dysphagia, oropharyngeal phase: Secondary | ICD-10-CM | POA: Diagnosis not present

## 2015-10-21 DIAGNOSIS — Z9181 History of falling: Secondary | ICD-10-CM | POA: Diagnosis not present

## 2015-10-21 DIAGNOSIS — W19XXXA Unspecified fall, initial encounter: Secondary | ICD-10-CM | POA: Diagnosis present

## 2015-10-21 DIAGNOSIS — R2689 Other abnormalities of gait and mobility: Secondary | ICD-10-CM | POA: Diagnosis not present

## 2015-10-21 DIAGNOSIS — Z6820 Body mass index (BMI) 20.0-20.9, adult: Secondary | ICD-10-CM | POA: Diagnosis not present

## 2015-10-21 DIAGNOSIS — S7291XA Unspecified fracture of right femur, initial encounter for closed fracture: Secondary | ICD-10-CM | POA: Diagnosis present

## 2015-10-21 DIAGNOSIS — M25551 Pain in right hip: Secondary | ICD-10-CM | POA: Diagnosis not present

## 2015-10-21 DIAGNOSIS — S72401S Unspecified fracture of lower end of right femur, sequela: Secondary | ICD-10-CM | POA: Diagnosis not present

## 2015-10-21 DIAGNOSIS — Z4789 Encounter for other orthopedic aftercare: Secondary | ICD-10-CM | POA: Diagnosis not present

## 2015-10-21 DIAGNOSIS — R262 Difficulty in walking, not elsewhere classified: Secondary | ICD-10-CM | POA: Diagnosis not present

## 2015-10-21 DIAGNOSIS — S72401D Unspecified fracture of lower end of right femur, subsequent encounter for closed fracture with routine healing: Secondary | ICD-10-CM | POA: Diagnosis not present

## 2015-10-21 LAB — COMPREHENSIVE METABOLIC PANEL
ALT: 14 U/L (ref 14–54)
AST: 23 U/L (ref 15–41)
Albumin: 3.1 g/dL — ABNORMAL LOW (ref 3.5–5.0)
Alkaline Phosphatase: 48 U/L (ref 38–126)
Anion gap: 9 (ref 5–15)
BILIRUBIN TOTAL: 0.7 mg/dL (ref 0.3–1.2)
BUN: 11 mg/dL (ref 6–20)
CO2: 24 mmol/L (ref 22–32)
CREATININE: 0.69 mg/dL (ref 0.44–1.00)
Calcium: 9.3 mg/dL (ref 8.9–10.3)
Chloride: 106 mmol/L (ref 101–111)
GFR calc Af Amer: >60 mL/min (ref >60)
GLUCOSE: 143 mg/dL — AB (ref 65–99)
Potassium: 3.6 mmol/L (ref 3.5–5.1)
Sodium: 139 mmol/L (ref 135–145)
TOTAL PROTEIN: 8.8 g/dL — AB (ref 6.5–8.1)

## 2015-10-21 LAB — CBC WITH DIFFERENTIAL/PLATELET
BASOS PCT: 0 %
Basophils Absolute: 0 10*3/uL (ref 0.0–0.1)
Eosinophils Absolute: 0 10*3/uL (ref 0.0–0.7)
Eosinophils Relative: 0 %
HEMATOCRIT: 36.8 % (ref 36.0–46.0)
Hemoglobin: 11.8 g/dL — ABNORMAL LOW (ref 12.0–15.0)
LYMPHS PCT: 8 %
Lymphs Abs: 1.2 10*3/uL (ref 0.7–4.0)
MCH: 29 pg (ref 26.0–34.0)
MCHC: 32.1 g/dL (ref 30.0–36.0)
MCV: 90.4 fL (ref 78.0–100.0)
MONO ABS: 0.9 10*3/uL (ref 0.1–1.0)
MONOS PCT: 6 %
NEUTROS ABS: 12.8 10*3/uL — AB (ref 1.7–7.7)
Neutrophils Relative %: 86 %
Platelets: 262 10*3/uL (ref 150–400)
RBC: 4.07 MIL/uL (ref 3.87–5.11)
RDW: 13.9 % (ref 11.5–15.5)
WBC: 14.9 10*3/uL — ABNORMAL HIGH (ref 4.0–10.5)

## 2015-10-21 LAB — URINALYSIS, ROUTINE W REFLEX MICROSCOPIC
Bilirubin Urine: NEGATIVE
Glucose, UA: NEGATIVE mg/dL
HGB URINE DIPSTICK: NEGATIVE
Ketones, ur: NEGATIVE mg/dL
Leukocytes, UA: NEGATIVE
NITRITE: NEGATIVE
PROTEIN: NEGATIVE mg/dL
Specific Gravity, Urine: 1.015 (ref 1.005–1.030)
pH: 5.5 (ref 5.0–8.0)

## 2015-10-21 LAB — I-STAT TROPONIN, ED: TROPONIN I, POC: 0 ng/mL (ref 0.00–0.08)

## 2015-10-21 LAB — SURGICAL PCR SCREEN
MRSA, PCR: NEGATIVE
Staphylococcus aureus: NEGATIVE

## 2015-10-21 MED ORDER — FAMOTIDINE 20 MG PO TABS: 20.0000 mg | ORAL_TABLET | Freq: Every day | ORAL | Status: DC | PRN

## 2015-10-21 MED ORDER — PANTOPRAZOLE SODIUM 40 MG PO TBEC
40.0000 mg | DELAYED_RELEASE_TABLET | Freq: Every day | ORAL | Status: DC
  Administered 2015-10-23 – 2015-10-26 (×4): 40 mg via ORAL
  Filled 2015-10-21 (×4): qty 1

## 2015-10-21 MED ORDER — MORPHINE SULFATE (PF) 2 MG/ML IV SOLN
0.5000 mg | INTRAVENOUS | Status: DC | PRN
  Administered 2015-10-22: 0.5 mg via INTRAVENOUS
  Filled 2015-10-21 (×2): qty 1

## 2015-10-21 MED ORDER — CALCIUM-MAGNESIUM-VITAMIN D 185-50-100 MG-MG-UNIT PO CAPS: 1.0000 | ORAL_CAPSULE | Freq: Every day | ORAL | Status: DC

## 2015-10-21 MED ORDER — HYDROCODONE-ACETAMINOPHEN 5-325 MG PO TABS
1.0000 | ORAL_TABLET | Freq: Four times a day (QID) | ORAL | Status: DC | PRN
  Filled 2015-10-21: qty 1

## 2015-10-21 MED ORDER — DOCUSATE SODIUM 100 MG PO CAPS
100.0000 mg | ORAL_CAPSULE | Freq: Every day | ORAL | Status: DC
  Administered 2015-10-23 – 2015-10-25 (×3): 100 mg via ORAL
  Filled 2015-10-21 (×5): qty 1

## 2015-10-21 MED ORDER — CEFAZOLIN SODIUM-DEXTROSE 2-4 GM/100ML-% IV SOLN
2.0000 g | INTRAVENOUS | Status: DC
  Filled 2015-10-21 (×2): qty 100

## 2015-10-21 MED ORDER — SODIUM CHLORIDE 0.9 % IV SOLN
INTRAVENOUS | Status: DC
  Administered 2015-10-21 – 2015-10-22 (×2): via INTRAVENOUS

## 2015-10-21 MED ORDER — VITAMIN B-12 100 MCG PO TABS
100.0000 ug | ORAL_TABLET | Freq: Every day | ORAL | Status: DC
  Administered 2015-10-23 – 2015-10-26 (×4): 100 ug via ORAL
  Filled 2015-10-21 (×5): qty 1

## 2015-10-21 MED ORDER — POLYETHYLENE GLYCOL 3350 17 G PO PACK
17.0000 g | PACK | Freq: Every day | ORAL | Status: DC
  Administered 2015-10-23 – 2015-10-26 (×4): 17 g via ORAL
  Filled 2015-10-21 (×4): qty 1

## 2015-10-21 MED ORDER — ONDANSETRON HCL 4 MG/2ML IJ SOLN: 4.0000 mg | Freq: Three times a day (TID) | INTRAMUSCULAR | Status: AC | PRN

## 2015-10-21 MED ORDER — CALCIUM CARBONATE-VITAMIN D 500-200 MG-UNIT PO TABS
1.0000 | ORAL_TABLET | Freq: Every day | ORAL | Status: DC
  Administered 2015-10-23 – 2015-10-26 (×4): 1 via ORAL
  Filled 2015-10-21 (×6): qty 1

## 2015-10-21 MED ORDER — POVIDONE-IODINE 10 % EX SWAB
2.0000 "application " | Freq: Once | CUTANEOUS | Status: AC
  Administered 2015-10-22: 2 via TOPICAL

## 2015-10-21 MED ORDER — ENOXAPARIN SODIUM 30 MG/0.3ML ~~LOC~~ SOLN
30.0000 mg | SUBCUTANEOUS | Status: DC
  Administered 2015-10-22: 30 mg via SUBCUTANEOUS
  Filled 2015-10-21 (×2): qty 0.3

## 2015-10-21 MED ORDER — CHLORHEXIDINE GLUCONATE 4 % EX LIQD
60.0000 mL | Freq: Once | CUTANEOUS | Status: AC
  Administered 2015-10-22: 4 via TOPICAL
  Filled 2015-10-21: qty 60

## 2015-10-21 MED ORDER — FERROUS SULFATE 325 (65 FE) MG PO TABS
325.0000 mg | ORAL_TABLET | Freq: Every day | ORAL | Status: DC
  Administered 2015-10-23 – 2015-10-26 (×4): 325 mg via ORAL
  Filled 2015-10-21 (×5): qty 1

## 2015-10-21 MED ORDER — VITAMIN C 500 MG PO TABS
500.0000 mg | ORAL_TABLET | Freq: Every day | ORAL | Status: DC
  Administered 2015-10-23 – 2015-10-26 (×4): 500 mg via ORAL
  Filled 2015-10-21 (×4): qty 1

## 2015-10-21 MED ORDER — FUROSEMIDE 10 MG/ML IJ SOLN
20.0000 mg | Freq: Once | INTRAMUSCULAR | Status: AC
  Administered 2015-10-21: 20 mg via INTRAVENOUS
  Filled 2015-10-21: qty 2

## 2015-10-21 MED ORDER — AMLODIPINE BESYLATE 5 MG PO TABS
5.0000 mg | ORAL_TABLET | Freq: Every day | ORAL | Status: DC
  Administered 2015-10-23 – 2015-10-26 (×4): 5 mg via ORAL
  Filled 2015-10-21 (×4): qty 1

## 2015-10-21 MED ORDER — MORPHINE SULFATE (PF) 2 MG/ML IV SOLN
INTRAVENOUS | Status: AC
  Administered 2015-10-21: 0.5 mg via INTRAVENOUS
  Filled 2015-10-21: qty 1

## 2015-10-21 NOTE — Progress Notes (Signed)
Orthopedic Tech Progress Note Patient Details:  Sarah Perkins [birth year] [id number]  Musculoskeletal Traction Type of Traction: [place]ucks Skin Traction Traction Location: RLE Traction Weight: 10 lbs    Braulio Bosch 10/21/2015, 7:46 PM

## 2015-10-21 NOTE — ED Notes (Signed)
Was sitting in chair and got up to use bedside commode right in front of her. Was braced with handles and syncopized. Head then stuck in between commode and handle. Son pulled bar off and got her up. She aroused quickly. R hip shortened and rotated.

## 2015-10-21 NOTE — ED Provider Notes (Signed)
CSN: [id number]     Arrival date & time 10/21/15  1245 History   First MD Initiated Contact with Patient 10/21/15 1256     Chief Complaint  Patient presents with  . Fall  . Loss of Consciousness     HPI Was sitting in chair and got up to use bedside commode right in front of her. Was braced with handles and syncopized. Head then stuck in between commode and handle. Son pulled bar off and got her up. She aroused quickly. R hip shortened and rotated. Past Medical History  Diagnosis Date  . CHF (congestive heart failure) ([place])   . Hypertension   . Aneurysm ([place]) 1972    cerebral  . Acid reflux     occasional  . Tremor     worse on right arm  . History of GI diverticular bleed 06/2009    colonoscopy with endo clipping of bleeding tic by Dr Clarene Essex.  Flex sig by Dr Paulita Fujita.  . Acute blood loss anemia 06/2009    received ~ 6 units blood  . Adenomatous polyp of colon 06/2009  . Diverticulitis   . UTI (lower urinary tract infection)   . GI bleed 02/14/2014   Past Surgical History  Procedure Laterality Date  . Hip fracture surgery  2011  . Colonoscopy w/ control of hemorrhage  06/2009  . Knee arthroscopy    . Cerebral aneurysm repair  1970's  . Colonoscopy  09/22/2011    Procedure: COLONOSCOPY;  Surgeon: Juanita Craver, MD;  Location: [facility] ENDOSCOPY;  Service: Endoscopy;  Laterality: N/A;  wants ped scope   Family History  Problem Relation Age of Onset  . Stroke Mother   . Lung disease Father   . Stroke Brother    Social History  Substance Use Topics  . Smoking status: Never Smoker   . Smokeless tobacco: Never Used  . Alcohol Use: No   OB History    No data available     Review of Systems  All other systems reviewed and are negative.     Allergies  Review of patient's allergies indicates no known allergies.  Home Medications   Prior to Admission medications   Medication Sig Start Date End Date Taking? Authorizing Provider  amLODipine (NORVASC) 5 MG tablet Take 5 mg  by mouth daily.   Yes Historical Provider, MD  Calcium-Magnesium-Vitamin D 185-50-100 MG-MG-UNIT CAPS Take 1 capsule by mouth daily.   Yes Historical Provider, MD  cyanocobalamin 100 MCG tablet Take 100 mcg by mouth daily.   Yes Historical Provider, MD  docusate sodium (COLACE) 100 MG capsule Take 100 mg by mouth at bedtime.   Yes Historical Provider, MD  ferrous sulfate 325 (65 FE) MG tablet Take 325 mg by mouth daily with breakfast.   Yes Historical Provider, MD  furosemide (LASIX) 20 MG tablet Take 1 tablet (20 mg total) by mouth daily as needed for fluid or edema. Patient taking differently: Take 10 mg by mouth daily.  06/08/15  Yes Florencia Reasons, MD  pantoprazole (PROTONIX) 40 MG tablet Take 1 tablet (40 mg total) by mouth daily. 06/08/15  Yes Florencia Reasons, MD  polyethylene glycol (MIRALAX / GLYCOLAX) packet Take 17 g by mouth every morning.   Yes Historical Provider, MD  ranitidine (ZANTAC) 150 MG tablet Take 150 mg by mouth 2 (two) times daily as needed for heartburn.   Yes Historical Provider, MD  vitamin C (ASCORBIC ACID) 500 MG tablet Take 500 mg by mouth daily.  Yes Historical Provider, MD   BP 143/59 mmHg  Pulse 101  Temp(Src) 98.5 F (36.9 C) (Oral)  Resp 18  Ht 5\' 7"  (1.702 m)  Wt 135 lb (61.236 kg)  BMI 21.14 kg/m2  SpO2 94% Physical Exam  Constitutional: She is oriented to person, place, and time. She appears well-developed and well-nourished. No distress.  HENT:  Head: Normocephalic and atraumatic.  Eyes: Pupils are equal, round, and reactive to light.  Neck: Normal range of motion.  Cardiovascular: Normal rate and intact distal pulses.   Pulmonary/Chest: No respiratory distress.  Abdominal: Normal appearance. She exhibits no distension.  Musculoskeletal: She exhibits tenderness.       Right knee: She exhibits decreased range of motion, swelling and deformity.  Neurological: She is alert and oriented to person, place, and time. No cranial nerve deficit.  Skin: Skin is warm and  dry. No rash noted.  Psychiatric: She has a normal mood and affect. Her behavior is normal.  Nursing note and vitals reviewed.   ED Course  Procedures (including critical care time) Labs Review Labs Reviewed  CBC WITH DIFFERENTIAL/PLATELET - Abnormal; Notable for the following:    WBC 14.9 (*)    Hemoglobin 11.8 (*)    Neutro Abs 12.8 (*)    All other components within normal limits  COMPREHENSIVE METABOLIC PANEL - Abnormal; Notable for the following:    Glucose, Bld 143 (*)    Total Protein 8.8 (*)    Albumin 3.1 (*)    All other components within normal limits  Randolm Idol, ED    Imaging Review Dg Chest 1 View  10/21/2015  CLINICAL DATA:  Syncope.  Pain. EXAM: CHEST 1 VIEW COMPARISON:  06/01/2015 chest radiograph. FINDINGS: Stable cardiomediastinal silhouette with mild cardiomegaly. No pneumothorax. No pleural effusion. Mild pulmonary edema. Mild left basilar scarring versus atelectasis. IMPRESSION: 1. Mild congestive heart failure . 2. Mild left basilar scarring versus atelectasis. Electronically Signed   By: Ilona Sorrel M.D.   On: 10/21/2015 13:58   Ct Head Wo Contrast  10/21/2015  CLINICAL DATA:  [place] and struck LEFT frontal aspect of head on corner of bath tub, headache, pain in back of neck, CHF, hypertension, prior cerebral aneurysm repair EXAM: CT HEAD WITHOUT CONTRAST CT CERVICAL SPINE WITHOUT CONTRAST TECHNIQUE: Multidetector CT imaging of the head and cervical spine was performed following the standard protocol without intravenous contrast. Multiplanar CT image reconstructions of the cervical spine were also generated. COMPARISON:  CT head 03/29/2011 FINDINGS: CT HEAD FINDINGS Generalized atrophy. Prominent ventricular system, upper normal for degree of atrophy, stable. No midline shift or mass effect. Extensive small vessel chronic ischemic changes of deep cerebral white matter. No intracranial hemorrhage, mass lesion, or evidence acute infarction. No acute extra-axial  fluid collections. Beam hardening artifacts at anterior skullbase from aneurysm clips question at anterior cerebral arteries. Prior RIGHT frontal craniotomy. Bones and sinuses otherwise unremarkable. CT CERVICAL SPINE FINDINGS Diffuse osseous demineralization. Scattered disc space narrowing and endplate spur formation throughout cervical spine. Multilevel degenerative facet disease changes of the cervical spine. Visualized skullbase intact. Vertebral body heights maintained without fracture or subluxation. Biapical lung scarring. Atherosclerotic calcifications within the carotid and vertebral arteries bilaterally. IMPRESSION: Prior anterior skullbase aneurysm clipping. Atrophy with small vessel chronic ischemic changes of deep cerebral white matter. No acute intracranial abnormalities. Osseous demineralization with multilevel degenerative disc and facet disease changes cervical spine. No acute cervical spine abnormalities. Electronically Signed   By: Lavonia Dana M.D.   On: 10/21/2015 14:28  Ct Cervical Spine Wo Contrast  10/21/2015  CLINICAL DATA:  [place] and struck LEFT frontal aspect of head on corner of bath tub, headache, pain in back of neck, CHF, hypertension, prior cerebral aneurysm repair EXAM: CT HEAD WITHOUT CONTRAST CT CERVICAL SPINE WITHOUT CONTRAST TECHNIQUE: Multidetector CT imaging of the head and cervical spine was performed following the standard protocol without intravenous contrast. Multiplanar CT image reconstructions of the cervical spine were also generated. COMPARISON:  CT head 03/29/2011 FINDINGS: CT HEAD FINDINGS Generalized atrophy. Prominent ventricular system, upper normal for degree of atrophy, stable. No midline shift or mass effect. Extensive small vessel chronic ischemic changes of deep cerebral white matter. No intracranial hemorrhage, mass lesion, or evidence acute infarction. No acute extra-axial fluid collections. Beam hardening artifacts at anterior skullbase from aneurysm  clips question at anterior cerebral arteries. Prior RIGHT frontal craniotomy. Bones and sinuses otherwise unremarkable. CT CERVICAL SPINE FINDINGS Diffuse osseous demineralization. Scattered disc space narrowing and endplate spur formation throughout cervical spine. Multilevel degenerative facet disease changes of the cervical spine. Visualized skullbase intact. Vertebral body heights maintained without fracture or subluxation. Biapical lung scarring. Atherosclerotic calcifications within the carotid and vertebral arteries bilaterally. IMPRESSION: Prior anterior skullbase aneurysm clipping. Atrophy with small vessel chronic ischemic changes of deep cerebral white matter. No acute intracranial abnormalities. Osseous demineralization with multilevel degenerative disc and facet disease changes cervical spine. No acute cervical spine abnormalities. Electronically Signed   By: Lavonia Dana M.D.   On: 10/21/2015 14:28   Dg Knee Right Port  10/21/2015  CLINICAL DATA:  RIGHT knee pain after fall EXAM: PORTABLE RIGHT KNEE - 1-2 VIEW COMPARISON:  03/27/2013 FINDINGS: Comminuted fracture of the distal RIGHT femoral metaphysis with medial migration of the distal fracture fragment. There is impaction of the distal femur into the metaphysis seen on lateral projection. No clear extension to the articular surface. IMPRESSION: Comminuted impaction fracture of the RIGHT femoral metaphysis with angulation. Electronically Signed   By: Suzy Bouchard M.D.   On: 10/21/2015 15:49   Dg Hip Unilat With Pelvis 2-3 Views Right  10/21/2015  CLINICAL DATA:  Fall today.  Hit pain. EXAM: DG HIP (WITH OR WITHOUT PELVIS) 2-3V RIGHT COMPARISON:  CT 06/02/2015 FINDINGS: Pelvic bony ring is intact. There is a right hip arthroplasty which is located. No evidence for a periprosthetic fracture. Again noted are heterotopic ossifications along the lateral aspect of the left thigh. IMPRESSION: No acute bone abnormality in the pelvis or right hip.  Electronically Signed   By: Markus Daft M.D.   On: 10/21/2015 13:57   I have personally reviewed and evaluated these images and lab results as part of my medical decision-making.   EKG Interpretation None     Discussed with orthopedics, Dr. Ninfa Linden, and the hospitalist service.  Patient will be admitted for stabilization of fracture and further definitive treatment.  Patient remained awake alert during the stay in the ED. MDM   Final diagnoses:  Syncope, unspecified syncope type  Closed fracture of right femur, unspecified fracture morphology, unspecified portion of femur, initial encounter (HCC)        Leonard Schwartz, MD 10/21/15 [phone]

## 2015-10-21 NOTE — ED Notes (Signed)
EMS gave 150mg  fentanyl in route.

## 2015-10-21 NOTE — ED Notes (Signed)
Patient is resting comfortably. 

## 2015-10-21 NOTE — ED Notes (Addendum)
Pt O2 level dropped to 86% put on 3L on [place].

## 2015-10-21 NOTE — ED Notes (Signed)
Report attempted 

## 2015-10-21 NOTE — Consult Note (Signed)
Reason for Consult:  Right distal femur fracture Referring Physician:   Audie Pinto, MD/EDP  Sarah Perkins is an 82 y.o. female.  HPI:   82 yo frail female who lives with her son.  He was trying to help clean/wash her earlier today when he got her up and she fell.  There was possibly a syncopal episode.  Due to severe right thigh/knee pain and the inability to ambulate, she was brought to the ED for further evaluation.  In the Ed she was found to have a right distal femur fracture and ortho was consulted.  He son is at the bedside.  She reports right thigh/knee pain.  Past Medical History  Diagnosis Date  . CHF (congestive heart failure) ([place])   . Hypertension   . Aneurysm ([place]) 1972    cerebral  . Acid reflux     occasional  . Tremor     worse on right arm  . History of GI diverticular bleed 06/2009    colonoscopy with endo clipping of bleeding tic by Dr Clarene Essex.  Flex sig by Dr Paulita Fujita.  . Acute blood loss anemia 06/2009    received ~ 6 units blood  . Adenomatous polyp of colon 06/2009  . Diverticulitis   . UTI (lower urinary tract infection)   . GI bleed 02/14/2014    Past Surgical History  Procedure Laterality Date  . Hip fracture surgery  2011  . Colonoscopy w/ control of hemorrhage  06/2009  . Knee arthroscopy    . Cerebral aneurysm repair  1970's  . Colonoscopy  09/22/2011    Procedure: COLONOSCOPY;  Surgeon: Juanita Craver, MD;  [facility] ENDOSCOPY;  Service: Endoscopy;  Laterality: N/A;  wants ped scope    Family History  Problem Relation Age of Onset  . Stroke Mother   . Lung disease Father   . Stroke Brother     Social History:  reports that she has never smoked. She has never used smokeless tobacco. She reports that she does not drink alcohol or use illicit drugs.  Allergies: No Known Allergies  Medications: I have reviewed the patient's current medications.  Results for orders placed or performed during the hospital encounter of 10/21/15 (from the past 48  hour(s))  CBC with Differential     Status: Abnormal   Collection Time: 10/21/15  2:45 PM  Result Value Ref Range   WBC 14.9 (H) 4.0 - 10.5 K/uL   RBC 4.07 3.87 - 5.11 MIL/uL   Hemoglobin 11.8 (L) 12.0 - 15.0 g/dL   HCT 36.8 36.0 - 46.0 %   MCV 90.4 78.0 - 100.0 fL   MCH 29.0 26.0 - 34.0 pg   MCHC 32.1 30.0 - 36.0 g/dL   RDW 13.9 11.5 - 15.5 %   Platelets 262 150 - 400 K/uL   Neutrophils Relative % 86 %   Neutro Abs 12.8 (H) 1.7 - 7.7 K/uL   Lymphocytes Relative 8 %   Lymphs Abs 1.2 0.7 - 4.0 K/uL   Monocytes Relative 6 %   Monocytes Absolute 0.9 0.1 - 1.0 K/uL   Eosinophils Relative 0 %   Eosinophils Absolute 0.0 0.0 - 0.7 K/uL   Basophils Relative 0 %   Basophils Absolute 0.0 0.0 - 0.1 K/uL  Comprehensive metabolic panel     Status: Abnormal   Collection Time: 10/21/15  2:45 PM  Result Value Ref Range   Sodium 139 135 - 145 mmol/L   Potassium 3.6 3.5 - 5.1 mmol/L  Chloride 106 101 - 111 mmol/L   CO2 24 22 - 32 mmol/L   Glucose, Bld 143 (H) 65 - 99 mg/dL   BUN 11 6 - 20 mg/dL   Creatinine, Ser 0.69 0.44 - 1.00 mg/dL   Calcium 9.3 8.9 - 10.3 mg/dL   Total Protein 8.8 (H) 6.5 - 8.1 g/dL   Albumin 3.1 (L) 3.5 - 5.0 g/dL   AST 23 15 - 41 U/L   ALT 14 14 - 54 U/L   Alkaline Phosphatase 48 38 - 126 U/L   Total Bilirubin 0.7 0.3 - 1.2 mg/dL   GFR calc non Af Amer >60 >60 mL/min   GFR calc Af Amer >60 >60 mL/min    Comment: (NOTE) The eGFR has been calculated using the CKD EPI equation. This calculation has not been validated in all clinical situations. eGFR's persistently <60 mL/min signify possible Chronic Kidney Disease.    Anion gap 9 5 - 15  I-Stat Troponin, ED (not at [facility])     Status: None   Collection Time: 10/21/15  2:56 PM  Result Value Ref Range   Troponin i, poc 0.00 0.00 - 0.08 ng/mL   Comment 3            Comment: Due to the release kinetics of cTnI, a negative result within the first hours of the onset of symptoms does not rule out myocardial  infarction with certainty. If myocardial infarction is still suspected, repeat the test at appropriate intervals.     Dg Chest 1 View  10/21/2015  CLINICAL DATA:  Syncope.  Pain. EXAM: CHEST 1 VIEW COMPARISON:  06/01/2015 chest radiograph. FINDINGS: Stable cardiomediastinal silhouette with mild cardiomegaly. No pneumothorax. No pleural effusion. Mild pulmonary edema. Mild left basilar scarring versus atelectasis. IMPRESSION: 1. Mild congestive heart failure . 2. Mild left basilar scarring versus atelectasis. Electronically Signed   By: Ilona Sorrel M.D.   On: 10/21/2015 13:58   Ct Head Wo Contrast  10/21/2015  CLINICAL DATA:  [place] and struck LEFT frontal aspect of head on corner of bath tub, headache, pain in back of neck, CHF, hypertension, prior cerebral aneurysm repair EXAM: CT HEAD WITHOUT CONTRAST CT CERVICAL SPINE WITHOUT CONTRAST TECHNIQUE: Multidetector CT imaging of the head and cervical spine was performed following the standard protocol without intravenous contrast. Multiplanar CT image reconstructions of the cervical spine were also generated. COMPARISON:  CT head 03/29/2011 FINDINGS: CT HEAD FINDINGS Generalized atrophy. Prominent ventricular system, upper normal for degree of atrophy, stable. No midline shift or mass effect. Extensive small vessel chronic ischemic changes of deep cerebral white matter. No intracranial hemorrhage, mass lesion, or evidence acute infarction. No acute extra-axial fluid collections. Beam hardening artifacts at anterior skullbase from aneurysm clips question at anterior cerebral arteries. Prior RIGHT frontal craniotomy. Bones and sinuses otherwise unremarkable. CT CERVICAL SPINE FINDINGS Diffuse osseous demineralization. Scattered disc space narrowing and endplate spur formation throughout cervical spine. Multilevel degenerative facet disease changes of the cervical spine. Visualized skullbase intact. Vertebral body heights maintained without fracture or  subluxation. Biapical lung scarring. Atherosclerotic calcifications within the carotid and vertebral arteries bilaterally. IMPRESSION: Prior anterior skullbase aneurysm clipping. Atrophy with small vessel chronic ischemic changes of deep cerebral white matter. No acute intracranial abnormalities. Osseous demineralization with multilevel degenerative disc and facet disease changes cervical spine. No acute cervical spine abnormalities. Electronically Signed   By: Lavonia Dana M.D.   On: 10/21/2015 14:28   Ct Cervical Spine Wo Contrast  10/21/2015  CLINICAL DATA:  [place] and struck LEFT frontal aspect of head on corner of bath tub, headache, pain in back of neck, CHF, hypertension, prior cerebral aneurysm repair EXAM: CT HEAD WITHOUT CONTRAST CT CERVICAL SPINE WITHOUT CONTRAST TECHNIQUE: Multidetector CT imaging of the head and cervical spine was performed following the standard protocol without intravenous contrast. Multiplanar CT image reconstructions of the cervical spine were also generated. COMPARISON:  CT head 03/29/2011 FINDINGS: CT HEAD FINDINGS Generalized atrophy. Prominent ventricular system, upper normal for degree of atrophy, stable. No midline shift or mass effect. Extensive small vessel chronic ischemic changes of deep cerebral white matter. No intracranial hemorrhage, mass lesion, or evidence acute infarction. No acute extra-axial fluid collections. Beam hardening artifacts at anterior skullbase from aneurysm clips question at anterior cerebral arteries. Prior RIGHT frontal craniotomy. Bones and sinuses otherwise unremarkable. CT CERVICAL SPINE FINDINGS Diffuse osseous demineralization. Scattered disc space narrowing and endplate spur formation throughout cervical spine. Multilevel degenerative facet disease changes of the cervical spine. Visualized skullbase intact. Vertebral body heights maintained without fracture or subluxation. Biapical lung scarring. Atherosclerotic calcifications within the  carotid and vertebral arteries bilaterally. IMPRESSION: Prior anterior skullbase aneurysm clipping. Atrophy with small vessel chronic ischemic changes of deep cerebral white matter. No acute intracranial abnormalities. Osseous demineralization with multilevel degenerative disc and facet disease changes cervical spine. No acute cervical spine abnormalities. Electronically Signed   By: Lavonia Dana M.D.   On: 10/21/2015 14:28   Dg Knee Right Port  10/21/2015  CLINICAL DATA:  RIGHT knee pain after fall EXAM: PORTABLE RIGHT KNEE - 1-2 VIEW COMPARISON:  03/27/2013 FINDINGS: Comminuted fracture of the distal RIGHT femoral metaphysis with medial migration of the distal fracture fragment. There is impaction of the distal femur into the metaphysis seen on lateral projection. No clear extension to the articular surface. IMPRESSION: Comminuted impaction fracture of the RIGHT femoral metaphysis with angulation. Electronically Signed   By: Suzy Bouchard M.D.   On: 10/21/2015 15:49   Dg Hip Unilat With Pelvis 2-3 Views Right  10/21/2015  CLINICAL DATA:  Fall today.  Hit pain. EXAM: DG HIP (WITH OR WITHOUT PELVIS) 2-3V RIGHT COMPARISON:  CT 06/02/2015 FINDINGS: Pelvic bony ring is intact. There is a right hip arthroplasty which is located. No evidence for a periprosthetic fracture. Again noted are heterotopic ossifications along the lateral aspect of the left thigh. IMPRESSION: No acute bone abnormality in the pelvis or right hip. Electronically Signed   By: Markus Daft M.D.   On: 10/21/2015 13:57    ROS Blood pressure 143/59, pulse 101, temperature 98.5 F (36.9 C), temperature source Oral, resp. rate 18, height _0  (1.702 m), weight 61.236 kg (135 lb), SpO2 94 %. Physical Exam  Constitutional: She appears well-developed and well-nourished.  HENT:  Head: Normocephalic.  Eyes: Pupils are equal, round, and reactive to light.  Cardiovascular: Normal rate.   Respiratory: Effort normal.  GI: Soft.    Musculoskeletal:       Right knee: She exhibits swelling, deformity, abnormal alignment and bony tenderness. Tenderness found.       Legs: Neurological: She is alert.  Psychiatric: She has a normal mood and affect.   She is able to mover her right toes on command and has a well-perfused right foot.   Assessment/Plan: Right distal femur fracture 1)  I have spoken to her son at the bedside and showed him her x-rays.  I explained in detail what my surgical plan would be given the displaced nature of her fracture and the  potential for soft-tissue issues.  She is having considerable pain as well.  [facility] has been called to evalauae her as well.  She will need to be placed in bucks traction tonight and we will set her up for surgery tomorrow.  The risks and benefits of surgery were discussed in detail.  Mcarthur Rossetti 10/21/2015, 5:04 PM

## 2015-10-21 NOTE — ED Notes (Signed)
Attempted report 

## 2015-10-21 NOTE — H&P (Addendum)
TRH H&P   Patient Demographics:    Sarah Perkins, is a 82 y.o. female  MRN: [id number]   DOB - [birth year]  Admit Date - 10/21/2015  Outpatient Primary MD for the patient is Thressa Sheller, MD  Referring MD/NP/PA: Dr Audie Pinto  Patient coming from: Home  Chief Complaint  Patient presents with  . Fall  . Loss of Consciousness      HPI:    Sarah Perkins  is a 82 y.o. female, with PMH of GI bleeding due to diverticular, diverticulitis, dementia, duodenitis, hypertension, diastolic congestive heart failure, GERD, cerebral aneurysm repair 1970, who presents with syncope, and right femur fracture, atient with advanced dementia, history was obtained from son at bedside wwho was present at event, patient was standing from bedside commode, upon standing she braced herself with the handles, and syncopized, Sunday report loss of consciousness 5-10 seconds, she got her head stuck between commode chair and handle, son had to pull the parts of to get her up, nno seizure-like activity, no urinary or stool incontinence, she denies any chest pain, any shortness of breath, ppatient had history of syncope in the past related to vasovagal, she was complaining of significant right leg pain after fall, was given 150 mgIV fentanylby EMS in route, usually she does not require oxygen, currently she is hypoxic on room air on 2 L nasal cannula. In ED: x-ray significant for distal femur fracture, CT head and cervical spine with noacute intracranial and cervical abnormalitiese,lab work significant for mild leukocytosis, she is afebrile, urinalysis pending.    Review of systems:    In addition to the HPI above,  No Fever-chills, No Headache, No changes with Vision or hearing, No problems swallowing food or Liquids, No Chest pain, Cough or Shortness of Breath,ssyncope per son No Abdominal pain, No  Nausea or Vommitting, Bowel movements are regular, No Blood in stool or Urine, No dysuria, No new skin rashes or bruises, Complains of right leg pain No new weakness, tingling, numbness in any extremity, No recent weight gain or loss, No polyuria, polydypsia or polyphagia, No significant Mental Stressors.  A full 10 point Review of Systems was done, except as stated above, all other Review of Systems were negative.   With Past History of the following :    Past Medical History  Diagnosis Date  . CHF (congestive heart failure) ([place])   . Hypertension   . Aneurysm ([place]) 1972    cerebral  . Acid reflux     occasional  . Tremor     worse on right arm  . History of GI diverticular bleed 06/2009    colonoscopy with endo clipping of bleeding tic by Dr Clarene Essex.  Flex sig by Dr Paulita Fujita.  . Acute blood loss anemia 06/2009    received ~ 6 units blood  . Adenomatous polyp of colon 06/2009  . Diverticulitis   . UTI (  lower urinary tract infection)   . GI bleed 02/14/2014      Past Surgical History  Procedure Laterality Date  . Hip fracture surgery  2011  . Colonoscopy w/ control of hemorrhage  06/2009  . Knee arthroscopy    . Cerebral aneurysm repair  1970's  . Colonoscopy  09/22/2011    Procedure: COLONOSCOPY;  Surgeon: Juanita Craver, MD;  Location: [facility] ENDOSCOPY;  Service: Endoscopy;  Laterality: N/A;  wants ped scope      Social History:     Social History  Substance Use Topics  . Smoking status: Never Smoker   . Smokeless tobacco: Never Used  . Alcohol Use: No     Lives - at home with son Mobility - wheelchair dependent, occasionally with walker    Family History :     Family History  Problem Relation Age of Onset  . Stroke Mother   . Lung disease Father   . Stroke Brother       Home Medications:   Prior to Admission medications   Medication Sig Start Date End Date Taking? Authorizing Provider  amLODipine (NORVASC) 5 MG tablet Take 5 mg by mouth daily.   Yes  Historical Provider, MD  Calcium-Magnesium-Vitamin D 185-50-100 MG-MG-UNIT CAPS Take 1 capsule by mouth daily.   Yes Historical Provider, MD  cyanocobalamin 100 MCG tablet Take 100 mcg by mouth daily.   Yes Historical Provider, MD  docusate sodium (COLACE) 100 MG capsule Take 100 mg by mouth at bedtime.   Yes Historical Provider, MD  ferrous sulfate 325 (65 FE) MG tablet Take 325 mg by mouth daily with breakfast.   Yes Historical Provider, MD  furosemide (LASIX) 20 MG tablet Take 1 tablet (20 mg total) by mouth daily as needed for fluid or edema. Patient taking differently: Take 10 mg by mouth daily.  06/08/15  Yes Florencia Reasons, MD  pantoprazole (PROTONIX) 40 MG tablet Take 1 tablet (40 mg total) by mouth daily. 06/08/15  Yes Florencia Reasons, MD  polyethylene glycol (MIRALAX / GLYCOLAX) packet Take 17 g by mouth every morning.   Yes Historical Provider, MD  ranitidine (ZANTAC) 150 MG tablet Take 150 mg by mouth 2 (two) times daily as needed for heartburn.   Yes Historical Provider, MD  vitamin C (ASCORBIC ACID) 500 MG tablet Take 500 mg by mouth daily.   Yes Historical Provider, MD     Allergies:    No Known Allergies   Physical Exam:   Vitals  Blood pressure 143/59, pulse 101, temperature 98.5 F (36.9 C), temperature source Oral, resp. rate 18, height 5\' 7"  (1.702 m), weight 61.236 kg (135 lb), SpO2 94 %.   1. General frail elderly female lying in bed in NAD,   2. Normal affect and insight, Not Suicidal or Homicidal, Awake Alert, Oriented X .  3. No F.N deficits, ALL C.Nerves Intact, Strength 5/5 all 4 extremities, Sensation intact all 4 extremities, Plantars down going.  4. Ears and Eyes appear Normal, patient had area of bruising in left suborbital area, Conjunctivae clear, PERRLA. Moist Oral Mucosa.  5. Supple Neck, No JVD, No cervical lymphadenopathy appriciated, No Carotid Bruits.  6. Symmetrical Chest wall movement, Good air movement bilaterally, CTAB.  7. RRR, No Gallops, Rubs or  Murmurs, No Parasternal Heave.  8. Positive Bowel Sounds, Abdomen Soft, No tenderness, No organomegaly appriciated,No rebound -guarding or rigidity.  9.  No Cyanosis, Normal Skin Turgor, No Skin Rash or Bruise.  10. Good muscle tone, right lower  extremity movement limited secondary to pain, patient with significant swelling in distal thigh area, trace pedal edema.  11. No Palpable Lymph Nodes in Neck or Axillae    Data Review:    CBC  Recent Labs Lab 10/21/15 1445  WBC 14.9*  HGB 11.8*  HCT 36.8  PLT 262  MCV 90.4  MCH 29.0  MCHC 32.1  RDW 13.9  LYMPHSABS 1.2  MONOABS 0.9  EOSABS 0.0  BASOSABS 0.0   ------------------------------------------------------------------------------------------------------------------  Chemistries   Recent Labs Lab 10/21/15 1445  NA 139  K 3.6  CL 106  CO2 24  GLUCOSE 143*  BUN 11  CREATININE 0.69  CALCIUM 9.3  AST 23  ALT 14  ALKPHOS 48  BILITOT 0.7   ------------------------------------------------------------------------------------------------------------------ estimated creatinine clearance is 40.6 mL/min (by C-G formula based on Cr of 0.69). ------------------------------------------------------------------------------------------------------------------ No results for input(s): TSH, T4TOTAL, T3FREE, THYROIDAB in the last 72 hours.  Invalid input(s): FREET3  Coagulation profile No results for input(s): INR, PROTIME in the last 168 hours. ------------------------------------------------------------------------------------------------------------------- No results for input(s): DDIMER in the last 72 hours. -------------------------------------------------------------------------------------------------------------------  Cardiac Enzymes No results for input(s): CKMB, TROPONINI, MYOGLOBIN in the last 168 hours.  Invalid input(s):  CK ------------------------------------------------------------------------------------------------------------------    Component Value Date/Time   BNP 61.4 06/02/2015 0516     ---------------------------------------------------------------------------------------------------------------  Urinalysis    Component Value Date/Time   COLORURINE YELLOW 06/01/2015 2143   APPEARANCEUR CLOUDY* 06/01/2015 2143   LABSPEC 1.017 06/01/2015 2143   PHURINE 7.0 06/01/2015 2143   GLUCOSEU NEGATIVE 06/01/2015 2143   HGBUR NEGATIVE 06/01/2015 2143   BILIRUBINUR NEGATIVE 06/01/2015 2143   [place] NEGATIVE 06/01/2015 2143   PROTEINUR 30* 06/01/2015 2143   UROBILINOGEN 0.2 10/08/2014 1225   NITRITE NEGATIVE 06/01/2015 2143   LEUKOCYTESUR TRACE* 06/01/2015 2143    ----------------------------------------------------------------------------------------------------------------   Imaging Results:    Dg Chest 1 View  10/21/2015  CLINICAL DATA:  Syncope.  Pain. EXAM: CHEST 1 VIEW COMPARISON:  06/01/2015 chest radiograph. FINDINGS: Stable cardiomediastinal silhouette with mild cardiomegaly. No pneumothorax. No pleural effusion. Mild pulmonary edema. Mild left basilar scarring versus atelectasis. IMPRESSION: 1. Mild congestive heart failure . 2. Mild left basilar scarring versus atelectasis. Electronically Signed   By: Ilona Sorrel M.D.   On: 10/21/2015 13:58   Ct Head Wo Contrast  10/21/2015  CLINICAL DATA:  [place] and struck LEFT frontal aspect of head on corner of bath tub, headache, pain in back of neck, CHF, hypertension, prior cerebral aneurysm repair EXAM: CT HEAD WITHOUT CONTRAST CT CERVICAL SPINE WITHOUT CONTRAST TECHNIQUE: Multidetector CT imaging of the head and cervical spine was performed following the standard protocol without intravenous contrast. Multiplanar CT image reconstructions of the cervical spine were also generated. COMPARISON:  CT head 03/29/2011 FINDINGS: CT HEAD FINDINGS  Generalized atrophy. Prominent ventricular system, upper normal for degree of atrophy, stable. No midline shift or mass effect. Extensive small vessel chronic ischemic changes of deep cerebral white matter. No intracranial hemorrhage, mass lesion, or evidence acute infarction. No acute extra-axial fluid collections. Beam hardening artifacts at anterior skullbase from aneurysm clips question at anterior cerebral arteries. Prior RIGHT frontal craniotomy. Bones and sinuses otherwise unremarkable. CT CERVICAL SPINE FINDINGS Diffuse osseous demineralization. Scattered disc space narrowing and endplate spur formation throughout cervical spine. Multilevel degenerative facet disease changes of the cervical spine. Visualized skullbase intact. Vertebral body heights maintained without fracture or subluxation. Biapical lung scarring. Atherosclerotic calcifications within the carotid and vertebral arteries bilaterally. IMPRESSION: Prior anterior skullbase aneurysm clipping. Atrophy with small vessel  chronic ischemic changes of deep cerebral white matter. No acute intracranial abnormalities. Osseous demineralization with multilevel degenerative disc and facet disease changes cervical spine. No acute cervical spine abnormalities. Electronically Signed   By: Lavonia Dana M.D.   On: 10/21/2015 14:28   Ct Cervical Spine Wo Contrast  10/21/2015  CLINICAL DATA:  [place] and struck LEFT frontal aspect of head on corner of bath tub, headache, pain in back of neck, CHF, hypertension, prior cerebral aneurysm repair EXAM: CT HEAD WITHOUT CONTRAST CT CERVICAL SPINE WITHOUT CONTRAST TECHNIQUE: Multidetector CT imaging of the head and cervical spine was performed following the standard protocol without intravenous contrast. Multiplanar CT image reconstructions of the cervical spine were also generated. COMPARISON:  CT head 03/29/2011 FINDINGS: CT HEAD FINDINGS Generalized atrophy. Prominent ventricular system, upper normal for degree of  atrophy, stable. No midline shift or mass effect. Extensive small vessel chronic ischemic changes of deep cerebral white matter. No intracranial hemorrhage, mass lesion, or evidence acute infarction. No acute extra-axial fluid collections. Beam hardening artifacts at anterior skullbase from aneurysm clips question at anterior cerebral arteries. Prior RIGHT frontal craniotomy. Bones and sinuses otherwise unremarkable. CT CERVICAL SPINE FINDINGS Diffuse osseous demineralization. Scattered disc space narrowing and endplate spur formation throughout cervical spine. Multilevel degenerative facet disease changes of the cervical spine. Visualized skullbase intact. Vertebral body heights maintained without fracture or subluxation. Biapical lung scarring. Atherosclerotic calcifications within the carotid and vertebral arteries bilaterally. IMPRESSION: Prior anterior skullbase aneurysm clipping. Atrophy with small vessel chronic ischemic changes of deep cerebral white matter. No acute intracranial abnormalities. Osseous demineralization with multilevel degenerative disc and facet disease changes cervical spine. No acute cervical spine abnormalities. Electronically Signed   By: Lavonia Dana M.D.   On: 10/21/2015 14:28   Dg Knee Right Port  10/21/2015  CLINICAL DATA:  RIGHT knee pain after fall EXAM: PORTABLE RIGHT KNEE - 1-2 VIEW COMPARISON:  03/27/2013 FINDINGS: Comminuted fracture of the distal RIGHT femoral metaphysis with medial migration of the distal fracture fragment. There is impaction of the distal femur into the metaphysis seen on lateral projection. No clear extension to the articular surface. IMPRESSION: Comminuted impaction fracture of the RIGHT femoral metaphysis with angulation. Electronically Signed   By: Suzy Bouchard M.D.   On: 10/21/2015 15:49   Dg Hip Unilat With Pelvis 2-3 Views Right  10/21/2015  CLINICAL DATA:  Fall today.  Hit pain. EXAM: DG HIP (WITH OR WITHOUT PELVIS) 2-3V RIGHT COMPARISON:   CT 06/02/2015 FINDINGS: Pelvic bony ring is intact. There is a right hip arthroplasty which is located. No evidence for a periprosthetic fracture. Again noted are heterotopic ossifications along the lateral aspect of the left thigh. IMPRESSION: No acute bone abnormality in the pelvis or right hip. Electronically Signed   By: Markus Daft M.D.   On: 10/21/2015 13:57    My personal review of EKG: Rhythm NSR, Rate  106/min, QTc 469 , no Acute ST changes   Assessment & Plan:    Active Problems:   Dementia   Chronic diastolic CHF (congestive heart failure) (HCC)   CKD (chronic kidney disease) stage 2, GFR 60-89 ml/min   Syncope   Femur fracture, right (HCC)  Right distal femur fracture - Secondary to fall from syncope, she will be admitted to telemetry bed, hip fracture order set utilized on admission, to be seen by Dr. Ninfa Linden, discussed with him, but he likely will need surgical intervention, patient is moderate risk for surgical procedures, as no evidence of significant CHF  or volume overload, no arrhythmias. - continue with gentle hydration, when necessary nausea and pain medication.  Syncope - Patient appears to be having history of syncope in the past, most likely vasovagal, 2-D echo done in February 2017, will repeat 2-D echo, monitor on telemetry.  Leukocytosis - Most likely stress related from fall, she is afebrile, chest x-ray with no evidence of lung infection, will obtain urinalysis  Chronic diastolic CHF - chest x-ray with evidence of mild volume overload, but patient appears to be euvolemic on physical exam, We'll give one dose of IV Lasix net suspicion for surgery tomorrow.  Hypertension - Blood pressure acceptable, continue with amlodipine  Hypoxia - Patient  with mild hypoxia, this is after she received IV pain medication, will monitor closely.  Dementia - Continue with supportive care  GERD - Continue with PPI  CKD stage II - at baseline   DVT Prophylaxis   [place]  heparin - SCDs  AM Labs Ordered, also please review Full Orders  Family Communication: Admission, patients condition and plan of care including tests being ordered have been discussed with the patient and son who indicate understanding and agree with the plan and Code Status.  Code Status DNR, pperformed by son  Likely DC to  SNF  Condition GUARDED    Consults called: Ortho Dr Ninfa Linden  Admission status: inpatient   Time spent in minutes : 60 minutes   ELGERGAWY, DAWOOD M.D on 10/21/2015 at 4:41 PM  Between 7am to 7pm - Pager - [phone]. After 7pm go to [email or web address] - password [facility]  [facility] - Office  [phone]

## 2015-10-22 ENCOUNTER — Inpatient Hospital Stay (HOSPITAL_COMMUNITY): Payer: Medicare Other

## 2015-10-22 ENCOUNTER — Inpatient Hospital Stay (HOSPITAL_COMMUNITY): Payer: Medicare Other | Admitting: Certified Registered Nurse Anesthetist

## 2015-10-22 ENCOUNTER — Other Ambulatory Visit (HOSPITAL_COMMUNITY): Payer: Medicare Other

## 2015-10-22 ENCOUNTER — Encounter (HOSPITAL_COMMUNITY)
Admission: EM | Disposition: A | Discharge status: Skilled Nursing Facility | Payer: Self-pay | Source: Home / Self Care | Attending: Internal Medicine

## 2015-10-22 DIAGNOSIS — S72401S Unspecified fracture of lower end of right femur, sequela: Secondary | ICD-10-CM

## 2015-10-22 DIAGNOSIS — R55 Syncope and collapse: Secondary | ICD-10-CM

## 2015-10-22 HISTORY — PX: ORIF FEMUR FRACTURE: SHX2119

## 2015-10-22 LAB — ECHOCARDIOGRAM COMPLETE
CHL CUP MV DEC (S): 190
E decel time: 190 msec
E/e' ratio: 10.12
FS: 41 % (ref 28–44)
HEIGHTINCHES: 67 in
IV/PV OW: 1
LA diam end sys: 24 mm
LA diam index: 1.43 cm/m2
LA vol A4C: 34 ml
LASIZE: 24 mm
LV E/e' medial: 10.12
LV PW d: 7.78 mm — AB (ref 0.6–1.1)
LV TDI E'LATERAL: 6.64
LV TDI E'MEDIAL: 5.77
LVEEAVG: 10.12
LVELAT: 6.64 cm/s
LVOT area: 2.54 cm2
LVOTD: 18 mm
MVPKAVEL: 96.2 m/s
MVPKEVEL: 67.2 m/s
TAPSE: 32.5 mm
WEIGHTICAEL: 2076.8 [oz_av]

## 2015-10-22 SURGERY — OPEN REDUCTION INTERNAL FIXATION (ORIF) DISTAL FEMUR FRACTURE
Anesthesia: General | Site: Leg Upper | Laterality: Right

## 2015-10-22 MED ORDER — 0.9 % SODIUM CHLORIDE (POUR BTL) OPTIME
TOPICAL | Status: DC | PRN
  Administered 2015-10-22: 1000 mL

## 2015-10-22 MED ORDER — FENTANYL CITRATE (PF) 250 MCG/5ML IJ SOLN
INTRAMUSCULAR | Status: AC
  Filled 2015-10-22: qty 5

## 2015-10-22 MED ORDER — MORPHINE SULFATE (PF) 2 MG/ML IV SOLN: 0.5000 mg | INTRAVENOUS | Status: DC | PRN

## 2015-10-22 MED ORDER — PROPOFOL 10 MG/ML IV BOLUS
INTRAVENOUS | Status: AC
  Filled 2015-10-22: qty 20

## 2015-10-22 MED ORDER — SODIUM CHLORIDE 0.9 % IV SOLN
INTRAVENOUS | Status: DC
  Administered 2015-10-22: 16:00:00 via INTRAVENOUS

## 2015-10-22 MED ORDER — FENTANYL CITRATE (PF) 100 MCG/2ML IJ SOLN: 25.0000 ug | INTRAMUSCULAR | Status: DC | PRN

## 2015-10-22 MED ORDER — PHENYLEPHRINE 40 MCG/ML (10ML) SYRINGE FOR IV PUSH (FOR BLOOD PRESSURE SUPPORT)
PREFILLED_SYRINGE | INTRAVENOUS | Status: AC
  Filled 2015-10-22: qty 10

## 2015-10-22 MED ORDER — LIDOCAINE 2% (20 MG/ML) 5 ML SYRINGE
INTRAMUSCULAR | Status: AC
  Filled 2015-10-22: qty 5

## 2015-10-22 MED ORDER — METHOCARBAMOL 500 MG PO TABS
500.0000 mg | ORAL_TABLET | Freq: Four times a day (QID) | ORAL | Status: DC | PRN
  Administered 2015-10-23: 500 mg via ORAL
  Filled 2015-10-22: qty 1

## 2015-10-22 MED ORDER — PHENOL 1.4 % MT LIQD: 1.0000 | OROMUCOSAL | Status: DC | PRN

## 2015-10-22 MED ORDER — HYDROCODONE-ACETAMINOPHEN 5-325 MG PO TABS
1.0000 | ORAL_TABLET | Freq: Four times a day (QID) | ORAL | Status: DC | PRN
  Administered 2015-10-23: 1 via ORAL

## 2015-10-22 MED ORDER — ONDANSETRON HCL 4 MG/2ML IJ SOLN: 4.0000 mg | Freq: Four times a day (QID) | INTRAMUSCULAR | Status: DC | PRN

## 2015-10-22 MED ORDER — ONDANSETRON HCL 4 MG PO TABS: 4.0000 mg | ORAL_TABLET | Freq: Four times a day (QID) | ORAL | Status: DC | PRN

## 2015-10-22 MED ORDER — PHENYLEPHRINE HCL 10 MG/ML IJ SOLN
INTRAMUSCULAR | Status: DC | PRN
  Administered 2015-10-22: 80 ug via INTRAVENOUS
  Administered 2015-10-22: 40 ug via INTRAVENOUS

## 2015-10-22 MED ORDER — EPHEDRINE 5 MG/ML INJ
INTRAVENOUS | Status: AC
  Filled 2015-10-22: qty 10

## 2015-10-22 MED ORDER — SUGAMMADEX SODIUM 200 MG/2ML IV SOLN
INTRAVENOUS | Status: DC | PRN
  Administered 2015-10-22: 120 mg via INTRAVENOUS

## 2015-10-22 MED ORDER — ROCURONIUM BROMIDE 50 MG/5ML IV SOLN
INTRAVENOUS | Status: AC
  Filled 2015-10-22: qty 1

## 2015-10-22 MED ORDER — METHOCARBAMOL 1000 MG/10ML IJ SOLN
500.0000 mg | Freq: Four times a day (QID) | INTRAVENOUS | Status: DC | PRN
  Filled 2015-10-22: qty 5

## 2015-10-22 MED ORDER — CEFAZOLIN SODIUM 1 G IJ SOLR
INTRAMUSCULAR | Status: AC
  Filled 2015-10-22: qty 20

## 2015-10-22 MED ORDER — SUCCINYLCHOLINE CHLORIDE 200 MG/10ML IV SOSY
PREFILLED_SYRINGE | INTRAVENOUS | Status: AC
  Filled 2015-10-22: qty 20

## 2015-10-22 MED ORDER — METOCLOPRAMIDE HCL 5 MG/ML IJ SOLN: 5.0000 mg | Freq: Three times a day (TID) | INTRAMUSCULAR | Status: DC | PRN

## 2015-10-22 MED ORDER — METOCLOPRAMIDE HCL 5 MG PO TABS: 5.0000 mg | ORAL_TABLET | Freq: Three times a day (TID) | ORAL | Status: DC | PRN

## 2015-10-22 MED ORDER — SUGAMMADEX SODIUM 200 MG/2ML IV SOLN
INTRAVENOUS | Status: AC
  Filled 2015-10-22: qty 2

## 2015-10-22 MED ORDER — ONDANSETRON HCL 4 MG/2ML IJ SOLN
INTRAMUSCULAR | Status: AC
  Filled 2015-10-22: qty 2

## 2015-10-22 MED ORDER — CEFAZOLIN SODIUM-DEXTROSE 2-4 GM/100ML-% IV SOLN
2.0000 g | Freq: Two times a day (BID) | INTRAVENOUS | Status: AC
  Administered 2015-10-22 – 2015-10-23 (×2): 2 g via INTRAVENOUS
  Filled 2015-10-22 (×2): qty 100

## 2015-10-22 MED ORDER — PROPOFOL 10 MG/ML IV BOLUS
INTRAVENOUS | Status: DC | PRN
Start: 2015-10-22 — End: 2015-10-22
  Administered 2015-10-22: 50 mg via INTRAVENOUS

## 2015-10-22 MED ORDER — MENTHOL 3 MG MT LOZG
1.0000 | LOZENGE | OROMUCOSAL | Status: DC | PRN
  Filled 2015-10-22: qty 9

## 2015-10-22 MED ORDER — ASPIRIN EC 325 MG PO TBEC
325.0000 mg | DELAYED_RELEASE_TABLET | Freq: Two times a day (BID) | ORAL | Status: DC
  Administered 2015-10-22 – 2015-10-26 (×7): 325 mg via ORAL
  Filled 2015-10-22 (×8): qty 1

## 2015-10-22 MED ORDER — ONDANSETRON HCL 4 MG/2ML IJ SOLN
INTRAMUSCULAR | Status: DC | PRN
  Administered 2015-10-22: 4 mg via INTRAVENOUS

## 2015-10-22 MED ORDER — ROCURONIUM BROMIDE 100 MG/10ML IV SOLN
INTRAVENOUS | Status: DC | PRN
  Administered 2015-10-22: 20 mg via INTRAVENOUS
  Administered 2015-10-22: 10 mg via INTRAVENOUS

## 2015-10-22 MED ORDER — LIDOCAINE HCL (CARDIAC) 20 MG/ML IV SOLN
INTRAVENOUS | Status: DC | PRN
  Administered 2015-10-22: 10 mg via INTRAVENOUS

## 2015-10-22 MED ORDER — FENTANYL CITRATE (PF) 100 MCG/2ML IJ SOLN
INTRAMUSCULAR | Status: DC | PRN
  Administered 2015-10-22 (×4): 50 ug via INTRAVENOUS

## 2015-10-22 MED ORDER — ACETAMINOPHEN 650 MG RE SUPP: 650.0000 mg | Freq: Four times a day (QID) | RECTAL | Status: DC | PRN

## 2015-10-22 MED ORDER — ACETAMINOPHEN 325 MG PO TABS: 650.0000 mg | ORAL_TABLET | Freq: Four times a day (QID) | ORAL | Status: DC | PRN

## 2015-10-22 MED ORDER — LACTATED RINGERS IV SOLN
INTRAVENOUS | Status: DC
  Administered 2015-10-22 (×3): via INTRAVENOUS

## 2015-10-22 SURGICAL SUPPLY — 79 items
BANDAGE ACE 4X5 VEL STRL LF (GAUZE/BANDAGES/DRESSINGS) ×2 IMPLANT
BANDAGE ACE 6X5 VEL STRL LF (GAUZE/BANDAGES/DRESSINGS) ×2 IMPLANT
BIT DRILL 4.3 (BIT) IMPLANT
BIT DRILL 4.8MMDIAX5IN DISPOSE (BIT) ×1 IMPLANT
BNDG COHESIVE 6X5 TAN STRL LF (GAUZE/BANDAGES/DRESSINGS) ×2 IMPLANT
BONE CANC CHIPS 40CC CAN1/2 (Bone Implant) ×3 IMPLANT
CAP LOCK NCB (Cap) ×10 IMPLANT
CHIPS CANC BONE 40CC CAN1/2 (Bone Implant) ×1 IMPLANT
CUFF TOURNIQUET SINGLE 34IN LL (TOURNIQUET CUFF) IMPLANT
CUFF TOURNIQUET SINGLE 44IN (TOURNIQUET CUFF) IMPLANT
DRAPE C-ARMOR (DRAPES) ×2 IMPLANT
DRAPE IMP U-DRAPE 54X76 (DRAPES) ×5 IMPLANT
DRAPE INCISE IOBAN 85X60 (DRAPES) ×4 IMPLANT
DRAPE STERI IOBAN 125X83 (DRAPES) ×3 IMPLANT
DRAPE SURG 17X23 STRL (DRAPES) ×3 IMPLANT
DRAPE U-SHAPE 76X120 STRL (DRAPES) ×4 IMPLANT
DRILL BIT 4.3 (BIT) ×3
DRILL BIT 4.8MMDIAX5IN DISPOSE (BIT) ×3
DRSG ADAPTIC 3X8 NADH LF (GAUZE/BANDAGES/DRESSINGS) ×3 IMPLANT
DRSG PAD ABDOMINAL 8X10 ST (GAUZE/BANDAGES/DRESSINGS) ×3 IMPLANT
DURAPREP 26ML APPLICATOR (WOUND CARE) ×3 IMPLANT
ELECT CAUTERY BLADE 6.4 (BLADE) ×3 IMPLANT
ELECT REM PT RETURN 9FT ADLT (ELECTROSURGICAL) ×3
ELECTRODE REM PT RTRN 9FT ADLT (ELECTROSURGICAL) ×1 IMPLANT
EVACUATOR 1/8 PVC DRAIN (DRAIN) IMPLANT
GAUZE SPONGE 4X4 12PLY STRL (GAUZE/BANDAGES/DRESSINGS) ×3 IMPLANT
GAUZE XEROFORM 5X9 LF (GAUZE/BANDAGES/DRESSINGS) ×2 IMPLANT
GLOVE BIO SURGEON STRL SZ8 (GLOVE) ×3 IMPLANT
GLOVE BIOGEL PI IND STRL 7.5 (GLOVE) ×1 IMPLANT
GLOVE BIOGEL PI IND STRL 8 (GLOVE) ×1 IMPLANT
GLOVE BIOGEL PI INDICATOR 7.5 (GLOVE) ×2
GLOVE BIOGEL PI INDICATOR 8 (GLOVE) ×2
GLOVE OPTIFIT SS 7.0 STRL BRWN (GLOVE) ×1
GLOVE OPTIFIT SS 7.5 STRL LX (GLOVE) ×1 IMPLANT
GLOVE ORTHO TXT STRL SZ7.5 (GLOVE) ×3 IMPLANT
GLOVE SURG ORTHO 8.0 STRL STRW (GLOVE) ×3 IMPLANT
GOWN STRL REUS W/ TWL LRG LVL3 (GOWN DISPOSABLE) ×3 IMPLANT
GOWN STRL REUS W/ TWL XL LVL3 (GOWN DISPOSABLE) ×2 IMPLANT
GOWN STRL REUS W/TWL LRG LVL3 (GOWN DISPOSABLE) ×9
GOWN STRL REUS W/TWL XL LVL3 (GOWN DISPOSABLE) ×6
GRAFT BNE CHIP CANC 1-8 40 (Bone Implant) IMPLANT
IMMOBILIZER KNEE 22 UNIV (SOFTGOODS) ×2 IMPLANT
K-WIRE 2.0 (WIRE) ×6
K-WIRE FXSTD 280X2XNS SS (WIRE) ×2
KIT BASIN OR (CUSTOM PROCEDURE TRAY) ×3 IMPLANT
KIT ROOM TURNOVER OR (KITS) ×3 IMPLANT
KWIRE FXSTD 280X2XNS SS (WIRE) IMPLANT
MANIFOLD NEPTUNE II (INSTRUMENTS) ×3 IMPLANT
NS IRRIG 1000ML POUR BTL (IV SOLUTION) ×3 IMPLANT
PACK GENERAL/GYN (CUSTOM PROCEDURE TRAY) ×3 IMPLANT
PACK UNIVERSAL I (CUSTOM PROCEDURE TRAY) ×3 IMPLANT
PAD ABD 8X10 STRL (GAUZE/BANDAGES/DRESSINGS) ×4 IMPLANT
PAD ARMBOARD 7.5X6 YLW CONV (MISCELLANEOUS) ×6 IMPLANT
PADDING CAST ABS 4INX4YD NS (CAST SUPPLIES) ×2
PADDING CAST ABS 6INX4YD NS (CAST SUPPLIES) ×2
PADDING CAST ABS COTTON 4X4 ST (CAST SUPPLIES) IMPLANT
PADDING CAST ABS COTTON 6X4 NS (CAST SUPPLIES) IMPLANT
PLATE DISTAL FEMUR 15H 317M RT (Plate) ×2 IMPLANT
PUTTY DBM STAGRAFT PLUS 10CC (Putty) ×2 IMPLANT
SCREW 5.0 60MM (Screw) ×1 IMPLANT
SCREW 5.0 70MM (Screw) ×2 IMPLANT
SCREW 5.0 80MM (Screw) ×2 IMPLANT
SCREW CORTICAL NCB 5.0X40 (Screw) ×2 IMPLANT
SCREW NCB 3.5X75X5X6.2XST (Screw) IMPLANT
SCREW NCB 5.0X34MM (Screw) ×6 IMPLANT
SCREW NCB 5.0X75MM (Screw) ×3 IMPLANT
SCREW NCB 5.0X85MM (Screw) ×2 IMPLANT
SPONGE LAP 18X18 X RAY DECT (DISPOSABLE) ×2 IMPLANT
STAPLER VISISTAT 35W (STAPLE) ×3 IMPLANT
STOCKINETTE IMPERVIOUS 9X36 MD (GAUZE/BANDAGES/DRESSINGS) ×2 IMPLANT
SUT VIC AB 0 CT1 27 (SUTURE) ×18
SUT VIC AB 0 CT1 27XBRD ANBCTR (SUTURE) ×3 IMPLANT
SUT VIC AB 1 CT1 27 (SUTURE) ×12
SUT VIC AB 1 CT1 27XBRD ANBCTR (SUTURE) ×1 IMPLANT
SUT VIC AB 2-0 CT1 27 (SUTURE) ×12
SUT VIC AB 2-0 CT1 TAPERPNT 27 (SUTURE) ×1 IMPLANT
TOWEL OR 17X24 6PK STRL BLUE (TOWEL DISPOSABLE) ×3 IMPLANT
TOWEL OR 17X26 10 PK STRL BLUE (TOWEL DISPOSABLE) ×3 IMPLANT
WATER STERILE IRR 1000ML POUR (IV SOLUTION) ×3 IMPLANT

## 2015-10-22 NOTE — Anesthesia Procedure Notes (Signed)
Procedure Name: Intubation Date/Time: 10/22/2015 10:33 AM Performed by: Rejeana Brock L Pre-anesthesia Checklist: Patient identified, Timeout performed, Emergency Drugs available, Suction available and Patient being monitored Patient Re-evaluated:Patient Re-evaluated prior to inductionOxygen Delivery Method: Circle system utilized Preoxygenation: Pre-oxygenation with 100% oxygen Intubation Type: IV induction Ventilation: Mask ventilation without difficulty Laryngoscope Size: Mac and 3 Grade View: Grade I Tube type: Oral Tube size: 7.0 mm Number of attempts: 1 Airway Equipment and Method: Stylet Placement Confirmation: ETT inserted through vocal cords under direct vision,  positive ETCO2 and breath sounds checked- equal and bilateral Secured at: 22 cm Tube secured with: Tape Dental Injury: Teeth and Oropharynx as per pre-operative assessment

## 2015-10-22 NOTE — Op Note (Signed)
NAMEMarland Perkins  CLEON, Sarah Perkins         ACCOUNT NO.:  [id number]  MEDICAL RECORD NO.:  [id number]  LOCATION:  [facility]                         FACILITY:  [place]  PHYSICIAN:  Lind Guest. Ninfa Linden, M.D.DATE OF BIRTH:  Dec 23, 1917  DATE OF PROCEDURE:  10/22/2015 DATE OF DISCHARGE:                              OPERATIVE REPORT   PREOPERATIVE DIAGNOSIS:  Right comminuted distal femur fracture.  POSTOPERATIVE DIAGNOSIS:  Right comminuted distal femur fracture.  PROCEDURE:  Open reduction and internal fixation of right comminuted distal femur fracture using periarticular locking plate.  IMPLANTS:  Zimmer NCB periprosthetic 15 hole plate and screws.  SURGEON:  Lind Guest. Ninfa Linden, M.D.  ASSISTANT:  Erskine Emery, PA-C  ANESTHESIA:  General.  ANTIBIOTICS:  2 g IV Ancef.  BLOOD LOSS:  200 mL.  COMPLICATIONS:  None.  INDICATIONS:  [name] is a 82 year old female who lives with her son as she was getting up yesterday, and he was getting up to help her to wash her, and she had a potential syncopal episode, and she fell. She has a very soft bone, and she sustained a distal femur fracture, was quite comminuted.  Of note, she does have a previous right hip hemiarthroplasty and sewed this and recommended open reduction and internal fixation of this fracture, and he did have a plate that goes proximally past the implant of the hemiarthroplasty stem to not create a stress riser.  The risks and benefits of this were explained to her son. He understands the risk of acute blood loss anemia, nerve and vessel injury, fracture, infection, and DVT.  He also understands given her age of 82 she has at least moderate risk for perioperative event.  He understands this fully.  She is in severe pain.  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  PROCEDURE DESCRIPTION:  After informed consent was obtained, appropriate right leg was marked.   She was cleared from General Medicine's standpoint.  General Medicine did mention echocardiogram for comparison purposes prior to surgery.  She has had 2 previous echocardiograms, both showed ejection fraction above 50.  After informed consent was obtained, appropriate right thigh was marked. She was brought to the operating room, where general anesthesia was obtained while she was on her stretcher.  She was then placed supine on the flat Jackson fracture table.  Right operative lower extremity was prepped and draped from the thigh down to the ankle with DuraPrep and sterile drapes including sterile stockinette.  A time-out was called. She was identified as correct patient and correct right femur.  I then made an incision at the distal knee laterally and dissected up the leg. We were able to clear out the soft-tissue and divided the iliotibial band finding a highly comminuted fracture.  We did tease as much as fracture fragments in place, and we were able to get it reasonably reduced.  We then chose a 15-hole periarticular locking plate and fashioned this along the lateral cortex of the femur.  We secured this with bicortical screws proximally and locking screws distally.  We then used a combination of DBM/DBX putty and cancellous chips to place into the depths of the distal femur.  We then closed the wound with the IP band closed with interrupted #1 Vicryl suture followed by 0 Vicryl in deep tissue, 2-0 Vicryl in subcutaneous tissue, interrupted staples on the skin.  Xeroform and well-padded sterile dressing was applied, and leg was placed in the immobilizer.  She was awakened, extubated, and taken to recovery room in fair condition.  Of note, Ronnette Hila entire case assistance was crucial for facilitating all aspects of this case.     Lind Guest. Ninfa Linden, M.D.     CYB/MEDQ  D:  10/22/2015  T:  10/22/2015  Job:  [id number]

## 2015-10-22 NOTE — Transfer of Care (Signed)
Immediate Anesthesia Transfer of Care Note  Patient: Sarah Perkins  Procedure(s) Performed: Procedure(s): OPEN REDUCTION INTERNAL FIXATION (ORIF) DISTAL FEMUR FRACTURE (Right)  Patient Location: PACU  Anesthesia Type:General  Level of Consciousness: awake  Airway & Oxygen Therapy: Patient Spontanous Breathing and Patient connected to face mask oxygen  Post-op Assessment: Report given to RN and Post -op Vital signs reviewed and stable  Post vital signs: Reviewed and stable  Last Vitals:  Filed Vitals:   10/22/15 0500 10/22/15 1259  BP: 139/69 151/63  Pulse: 95 88  Temp: 36.8 C 36.4 C  Resp: 18 19    Last Pain:  Filed Vitals:   10/22/15 1259  PainSc: 2       Patients Stated Pain Goal: 2 ([id number])  Complications: No apparent anesthesia complications

## 2015-10-22 NOTE — Anesthesia Postprocedure Evaluation (Signed)
Anesthesia Post Note  Patient: Sarah Perkins  Procedure(s) Performed: Procedure(s) (LRB): OPEN REDUCTION INTERNAL FIXATION (ORIF) DISTAL FEMUR FRACTURE (Right)  Patient location during evaluation: PACU Anesthesia Type: General Level of consciousness: awake and alert Pain management: pain level controlled Vital Signs Assessment: post-procedure vital signs reviewed and stable Respiratory status: spontaneous breathing, nonlabored ventilation, respiratory function stable and patient connected to nasal cannula oxygen Cardiovascular status: blood pressure returned to baseline and stable Postop Assessment: no signs of nausea or vomiting Anesthetic complications: no    Last Vitals:  Filed Vitals:   10/22/15 1420 10/22/15 1436  BP: 126/58 121/58  Pulse: 89 86  Temp: 36.4 C   Resp: 16     Last Pain:  Filed Vitals:   10/22/15 1436  PainSc: 2                  Linnie Delgrande,E. Lucrezia Dehne

## 2015-10-22 NOTE — Progress Notes (Signed)
PT Cancellation Note  Patient Details Name: TAEKO DERIDDER MRN: [id number] DOB: 09-29-1917   Cancelled Treatment:    Reason Eval/Treat Not Completed: Patient not medically ready   Will follow-up for PT eval postop;  Thanks,  Roney Marion, PT  Acute Rehabilitation Services Pager [phone] Office [phone]    Roney Marion [facility] 10/22/2015, 7:50 AM

## 2015-10-22 NOTE — Brief Op Note (Signed)
10/21/2015 - 10/22/2015  12:27 PM  PATIENT:  Sarah Perkins  82 y.o. female  PRE-OPERATIVE DIAGNOSIS:  right distal femur fracture  POST-OPERATIVE DIAGNOSIS:  right distal femur fracture  PROCEDURE:  Procedure(s): OPEN REDUCTION INTERNAL FIXATION (ORIF) DISTAL FEMUR FRACTURE (Right)  SURGEON:  Surgeon(s) and Role:    * Mcarthur Rossetti, MD - Primary  PHYSICIAN ASSISTANT: Benita Stabile, PA-C  ANESTHESIA:   general  EBL:  Total I/O In: 1700 [I.V.:1700] Out: 450 [Urine:150; Blood:300]  PLAN OF CARE: Discharge to home after PACU  PATIENT DISPOSITION:  PACU - hemodynamically stable.   Delay start of Pharmacological VTE agent (>24hrs) due to surgical blood loss or risk of bleeding: no

## 2015-10-22 NOTE — Progress Notes (Signed)
Patient with proximal femur fracture, moderate risk for Surgery, no new murmur, no acute CHF or arrhythmia, no further work up needed before surgery. 2-D echo ordered to evaluate syncope, and can be done after surgery. Phillips Climes MD Pager [phone]

## 2015-10-22 NOTE — Progress Notes (Signed)
Patient ID: Sarah Perkins, female   DOB: 1918/04/03, 82 y.o.   MRN: [id number]  PROGRESS NOTE    Sarah Perkins  [id number] DOB: June 07, 1917 DOA: 10/21/2015  PCP: Thressa Sheller, MD   Brief Narrative:  82 y.o. female, with past medical history of diverticular GI bleeding, diverticulitis, dementia, duodenitis, hypertension, diastolic congestive heart failure, GERD, cerebral aneurysm repair 1970 who presented with syncope. She was apparently standing up from bedside commode and upon standing up she lost consciousness for 5-10 seconds. Pt sustained right distal femur fracture. CT head and cervical spine with no acute intracranial and cervical abnormalities. Orthopedic surgery has seen in her consultation, plan for surgery today.    Assessment & Plan:   Active Problems:   Femur fracture, right ([place]) / Syncope  - Status post syncopal event - She sustained distal comminuted right femoral metaphysis fracture  - Surgery planned for today - Continue IV fluids for hydration until after the surgery - She had 2 D ECHO in 05/2015 with EF 55% and grade 1 DD - Follow up 2 D ECHO on this admission    Leukocytosis - Likely reactive from fall - No evidence of acute infectious process    Dementia - Stable    Chronic diastolic CHF (congestive heart failure) ([place]) - Last 2 D ECHO with EF 55% and grade 1 DD - 2 D ECHO pending on this admission     Essential hypertension - Continue norvasc    DVT prophylaxis: Lovenox subQ Code Status: DNR/DNI  Family Communication: no family at the bedside  Disposition Plan: SNF or home by 6/26   Consultants:   Orthopedic surgery  Procedures:   Surgery scheduled for today   Antimicrobials:   None    Subjective: No overnight events.   Objective: Filed Vitals:   10/21/15 1856 10/21/15 1955 10/21/15 2336 10/22/15 0500  BP: 149/71 144/60 126/99 139/69  Pulse: 104 103 99 95  Temp: 97.8 F (36.6 C) 98.7 F (37.1 C) 98.6 F (37  C) 98.2 F (36.8 C)  TempSrc: Oral Oral Oral Oral  Resp: 18 20 18 18   Height: 5\' 7"  (1.702 m)     Weight: 58.877 kg (129 lb 12.8 oz)     SpO2: 91% 96% 97% 95%    Intake/Output Summary (Last 24 hours) at 10/22/15 1014 Last data filed at 10/22/15 0700  Gross per 24 hour  Intake 521.67 ml  Output   1125 ml  Net -603.33 ml   Filed Weights   10/21/15 1247 10/21/15 1856  Weight: 61.236 kg (135 lb) 58.877 kg (129 lb 12.8 oz)    Examination:  General exam: Appears calm and comfortable  Respiratory system: Clear to auscultation. Respiratory effort normal. Cardiovascular system: S1 & S2 heard, RRR.  Gastrointestinal system: Abdomen is nondistended, soft and nontender. No organomegaly or masses felt. Normal bowel sounds heard. Central nervous system: Alert and oriented. No focal neurological deficits. Extremities: no swelling, palpable pulses  Skin: No rashes, lesions or ulcers Psychiatry: Mood & affect appropriate.   Data Reviewed: I have personally reviewed following labs and imaging studies  CBC:  Recent Labs Lab 10/21/15 1445  WBC 14.9*  NEUTROABS 12.8*  HGB 11.8*  HCT 36.8  MCV 90.4  PLT 99991111   Basic Metabolic Panel:  Recent Labs Lab 10/21/15 1445  NA 139  K 3.6  CL 106  CO2 24  GLUCOSE 143*  BUN 11  CREATININE 0.69  CALCIUM 9.3   GFR: Estimated Creatinine Clearance: 39.1 mL/min (by  C-G formula based on Cr of 0.69). Liver Function Tests:  Recent Labs Lab 10/21/15 1445  AST 23  ALT 14  ALKPHOS 48  BILITOT 0.7  PROT 8.8*  ALBUMIN 3.1*   No results for input(s): LIPASE, AMYLASE in the last 168 hours. No results for input(s): AMMONIA in the last 168 hours. Coagulation Profile: No results for input(s): INR, PROTIME in the last 168 hours. Cardiac Enzymes: No results for input(s): CKTOTAL, CKMB, CKMBINDEX, TROPONINI in the last 168 hours. BNP (last 3 results) No results for input(s): PROBNP in the last 8760 hours. HbA1C: No results for input(s):  HGBA1C in the last 72 hours. CBG: No results for input(s): GLUCAP in the last 168 hours. Lipid Profile: No results for input(s): CHOL, HDL, LDLCALC, TRIG, CHOLHDL, LDLDIRECT in the last 72 hours. Thyroid Function Tests: No results for input(s): TSH, T4TOTAL, FREET4, T3FREE, THYROIDAB in the last 72 hours. Anemia Panel: No results for input(s): VITAMINB12, FOLATE, FERRITIN, TIBC, IRON, RETICCTPCT in the last 72 hours. Urine analysis:    Component Value Date/Time   COLORURINE YELLOW 10/21/2015 2038   APPEARANCEUR CLEAR 10/21/2015 2038   LABSPEC 1.015 10/21/2015 2038   PHURINE 5.5 10/21/2015 2038   GLUCOSEU NEGATIVE 10/21/2015 2038   HGBUR NEGATIVE 10/21/2015 2038   BILIRUBINUR NEGATIVE 10/21/2015 2038   KETONESUR NEGATIVE 10/21/2015 2038   PROTEINUR NEGATIVE 10/21/2015 2038   UROBILINOGEN 0.2 10/08/2014 1225   NITRITE NEGATIVE 10/21/2015 2038   LEUKOCYTESUR NEGATIVE 10/21/2015 2038   Sepsis Labs: @LABRCNTIP (procalcitonin:4,lacticidven:4)  Recent Results (from the past 240 hour(s))  Surgical pcr screen     Status: None   Collection Time: 10/21/15  8:23 PM  Result Value Ref Range Status   MRSA, PCR NEGATIVE NEGATIVE Final   Staphylococcus aureus NEGATIVE NEGATIVE Final      Radiology Studies: Dg Chest 1 View 10/21/2015  1. Mild congestive heart failure . 2. Mild left basilar scarring versus atelectasis.   Ct Head Wo Contrast 10/21/2015  Prior anterior skullbase aneurysm clipping. Atrophy with small vessel chronic ischemic changes of deep cerebral white matter. No acute intracranial abnormalities. Osseous demineralization with multilevel degenerative disc and facet disease changes cervical spine. No acute cervical spine abnormalities.  Ct Cervical Spine Wo Contrast 10/21/2015  Prior anterior skullbase aneurysm clipping. Atrophy with small vessel chronic ischemic changes of deep cerebral white matter. No acute intracranial abnormalities. Osseous demineralization with multilevel  degenerative disc and facet disease changes cervical spine. No acute cervical spine abnormalities.   Dg Knee Right Port 10/21/2015   Comminuted impaction fracture of the RIGHT femoral metaphysis with angulation.   Dg Hip Unilat With Pelvis 2-3 Views Right 10/21/2015  No acute bone abnormality in the pelvis or right hip.      Scheduled Meds: . [MAR Hold] amLODipine  5 mg Oral Daily  . [MAR Hold] calcium-vitamin D  1 tablet Oral Q breakfast  .  ceFAZolin (ANCEF) IV  2 g Intravenous To SSTC  . [MAR Hold] docusate sodium  100 mg Oral QHS  . [MAR Hold] enoxaparin (LOVENOX) injection  30 mg Subcutaneous Q24H  . [MAR Hold] ferrous sulfate  325 mg Oral Q breakfast  . [MAR Hold] pantoprazole  40 mg Oral Daily  . [MAR Hold] polyethylene glycol  17 g Oral Daily  . [MAR Hold] cyanocobalamin  100 mcg Oral Daily  . [MAR Hold] vitamin C  500 mg Oral Daily   Continuous Infusions: . sodium chloride 50 mL/hr at 10/21/15 2034  . lactated ringers 50 mL/hr at  10/22/15 0958     LOS: 1 day    Time spent: 15 minutes  Greater than 50% of the time spent on counseling and coordinating the care.   Leisa Lenz, MD [facility] Pager [phone]  If 7PM-7AM, please contact night-coverage [email or web address] Password [email or web address] 10/22/2015, 10:14 AM

## 2015-10-22 NOTE — Anesthesia Preprocedure Evaluation (Addendum)
Anesthesia Evaluation  Patient identified by MRN, date of birth, ID band Patient awake and Patient confused    Reviewed: Allergy & Precautions, NPO status , Patient's Chart, lab work & pertinent test results  History of Anesthesia Complications Negative for: history of anesthetic complications  Airway Mallampati: III  TM Distance: >3 FB Neck ROM: Full    Dental  (+) Poor Dentition, Dental Advisory Given, Missing   Pulmonary neg pulmonary ROS,    breath sounds clear to auscultation       Cardiovascular hypertension, Pt. on medications +CHF   Rhythm:Regular Rate:Normal  10/23/15 ECHO: hypercontractile, EF 73% 2/17 ECHO: EF 55-60%, mod pulm HTN with grade 1 diastolic dysfunction   Neuro/Psych dementiaMultiple syncopal episodes Remote h/o cerebral aneurysm clipping TIA   GI/Hepatic Neg liver ROS, PUD, GERD  Medicated,H/o recurrent GI bleeding   Endo/Other  negative endocrine ROS  Renal/GU Renal InsufficiencyRenal disease     Musculoskeletal  (+) Arthritis , Osteoarthritis,  Hip/femur fracture   Abdominal   Peds  Hematology  (+) Blood dyscrasia (Hb 11.8), ,   Anesthesia Other Findings   Reproductive/Obstetrics                         Anesthesia Physical Anesthesia Plan  ASA: III  Anesthesia Plan: General   Post-op Pain Management:    Induction: Intravenous  Airway Management Planned: Oral ETT  Additional Equipment:   Intra-op Plan:   Post-operative Plan: Extubation in OR  Informed Consent: I have reviewed the patients History and Physical, chart, labs and discussed the procedure including the risks, benefits and alternatives for the proposed anesthesia with the patient or authorized representative who has indicated his/her understanding and acceptance.   Dental advisory given and Consent reviewed with POA  Plan Discussed with: CRNA and Surgeon  Anesthesia Plan Comments: (Plan  routine monitors, GETA)        Anesthesia Quick Evaluation

## 2015-10-23 ENCOUNTER — Encounter (HOSPITAL_COMMUNITY): Payer: Self-pay | Admitting: Orthopaedic Surgery

## 2015-10-23 ENCOUNTER — Other Ambulatory Visit: Payer: Self-pay

## 2015-10-23 DIAGNOSIS — D62 Acute posthemorrhagic anemia: Secondary | ICD-10-CM

## 2015-10-23 LAB — CBC
HEMATOCRIT: 21.8 % — AB (ref 36.0–46.0)
HEMOGLOBIN: 7.3 g/dL — AB (ref 12.0–15.0)
MCH: 29.2 pg (ref 26.0–34.0)
MCHC: 32.1 g/dL (ref 30.0–36.0)
MCV: 90.8 fL (ref 78.0–100.0)
Platelets: 201 10*3/uL (ref 150–400)
RBC: 2.4 MIL/uL — ABNORMAL LOW (ref 3.87–5.11)
RDW: 14.4 % (ref 11.5–15.5)
WBC: 18.3 10*3/uL — ABNORMAL HIGH (ref 4.0–10.5)

## 2015-10-23 LAB — BASIC METABOLIC PANEL
Anion gap: 7 (ref 5–15)
BUN: 20 mg/dL (ref 6–20)
CHLORIDE: 107 mmol/L (ref 101–111)
CO2: 25 mmol/L (ref 22–32)
Calcium: 7.9 mg/dL — ABNORMAL LOW (ref 8.9–10.3)
Creatinine, Ser: 1.02 mg/dL — ABNORMAL HIGH (ref 0.44–1.00)
GFR calc non Af Amer: 45 mL/min — ABNORMAL LOW (ref >60)
GFR, EST AFRICAN AMERICAN: 52 mL/min — AB (ref >60)
Glucose, Bld: 129 mg/dL — ABNORMAL HIGH (ref 65–99)
POTASSIUM: 3.5 mmol/L (ref 3.5–5.1)
SODIUM: 139 mmol/L (ref 135–145)

## 2015-10-23 LAB — PREPARE RBC (CROSSMATCH)

## 2015-10-23 MED ORDER — SODIUM CHLORIDE 0.9 % IV SOLN
Freq: Once | INTRAVENOUS | Status: AC
  Administered 2015-10-23: 18:00:00 via INTRAVENOUS

## 2015-10-23 NOTE — Progress Notes (Signed)
Initial Nutrition Assessment  DOCUMENTATION CODES:   Non-severe (moderate) malnutrition in context of chronic illness  INTERVENTION:  Diet advancement per MD; Recommend advancing to full liquids or Dysphagia 2 ASAP Recommend Dysphagia 2 diet due to pt's history of chewing/swallowing difficulty Provide Ensure Enlive po BID when diet is advanced, each supplement provides 350 kcal and 20 grams of protein Provide Magic Cup ice cream with meals, each cup provides 290 kcal and 9 grams of protein   NUTRITION DIAGNOSIS:   Predicted suboptimal nutrient intake related to lethargy/confusion, poor appetite as evidenced by meal completion < 25%, other (see comment) (recent surgery and clear liquid diet).   GOAL:   Patient will meet greater than or equal to 90% of their needs   MONITOR:   PO intake, Supplement acceptance, Labs, Weight trends, Skin, I & O's  REASON FOR ASSESSMENT:   Consult Assessment of nutrition requirement/status  ASSESSMENT:   82 y.o. female, with past medical history of diverticular GI bleeding, diverticulitis, dementia, duodenitis, hypertension, diastolic congestive heart failure, GERD, cerebral aneurysm repair 1970 who presented with syncope.  S/P R ORIF 6/25.  Pt asleep at time of visit. Moderate muscle wasting of temples, hands, and clavicles/acromion region noted and mild to moderate fat wasting of arms noted. Per pt's son at bedside, pt only ate a few spoonfuls of chicken broth for breakfast; pt has been sleeping more since surgery. Per son, pt usually eats soft chopped foods with 2 small meals and 1 good meal daily. Pt drinks Ensure as needed at home to supplement PO intake. Per son, pt usually weighs between 135 and 140 lbs.   Labs: low hemoglobin, low calcium  Diet Order:  Diet clear liquid Room service appropriate?: Yes; Fluid consistency:: Thin  Skin:  Wound (see comment) (closed incision on right thigh)  Last BM:  unknown  Height:   Ht Readings from  Last 1 Encounters:  10/21/15 5\' 7"  (1.702 m)    Weight:   Wt Readings from Last 1 Encounters:  10/23/15 134 lb 1.6 oz (60.827 kg)    Ideal Body Weight:  61.36 kg  BMI:  Body mass index is 21 kg/(m^2).  Estimated Nutritional Needs:   Kcal:  1300-1500  Protein:  70-80 grams  Fluid:  1.3-1.5 L/day  EDUCATION NEEDS:   No education needs identified at this time  [place], LDN Inpatient Clinical Dietitian Pager: [phone] After Hours Pager: [phone]

## 2015-10-23 NOTE — Progress Notes (Signed)
Orthopedic Tech Progress Note Patient Details:  Sarah Perkins 1918-03-27 [id number] Pt. is not able to use OHF.     Darrol Poke 10/23/2015, 3:20 PM

## 2015-10-23 NOTE — Progress Notes (Signed)
Subjective: 1 Day Post-Op Procedure(s) (LRB): OPEN REDUCTION INTERNAL FIXATION (ORIF) DISTAL FEMUR FRACTURE (Right) Patient reports pain as moderate.  Acute blood loss anemia from her extensive surgery and fracture.  Vitals stable.  Objective: Vital signs in last 24 hours: Temp:  [97.5 F (36.4 C)-98.6 F (37 C)] 98.6 F (37 C) (06/26 0518) Pulse Rate:  [79-108] 92 (06/26 0518) Resp:  [14-20] 16 (06/26 0518) BP: (112-151)/(45-63) 125/56 mmHg (06/26 0518) SpO2:  [93 %-100 %] 93 % (06/26 0518) Weight:  [60.827 kg (134 lb 1.6 oz)] 60.827 kg (134 lb 1.6 oz) (06/26 0518)  Intake/Output from previous day: 06/25 0701 - 06/26 0700 In: 3050 [P.O.:150; I.V.:2800; IV Piggyback:100] Out: 650 [Urine:350; Blood:300] Intake/Output this shift:     Recent Labs  10/21/15 1445 10/23/15 0426  HGB 11.8* 7.3*    Recent Labs  10/21/15 1445 10/23/15 0426  WBC 14.9* 18.3*  RBC 4.07 2.40*  HCT 36.8 21.8*  PLT 262 201    Recent Labs  10/21/15 1445 10/23/15 0426  NA 139 139  K 3.6 3.5  CL 106 107  CO2 24 25  BUN 11 20  CREATININE 0.69 1.02*  GLUCOSE 143* 129*  CALCIUM 9.3 7.9*   No results for input(s): LABPT, INR in the last 72 hours.  Intact pulses distally Dorsiflexion/Plantar flexion intact Incision: dressing C/D/I Compartment soft  Assessment/Plan: 1 Day Post-Op Procedure(s) (LRB): OPEN REDUCTION INTERNAL FIXATION (ORIF) DISTAL FEMUR FRACTURE (Right) Up with therapy - NWB right leg Will need SNF placement Transfusion per primary team  Aurthur Wingerter Y 10/23/2015, 7:44 AM

## 2015-10-23 NOTE — Patient Outreach (Signed)
[facility]) Care Management  10/23/2015  SHMYA TATUM [birth year] [id number]  Telephone call to patient regarding primary MD referral. Unable to reach patient. HIPAA compliant voice message left with call back phone number.   PLAN; RNCM will attempt 2nd telephone call to patient within  1 week.   Quinn Plowman RN,BSN,CCM [facility] Telephonic  [phone]

## 2015-10-23 NOTE — Evaluation (Signed)
Physical Therapy Evaluation Patient Details Name: Sarah Perkins MRN: [id number] DOB: 28-Jul-1917 Today's Date: 10/23/2015   History of Present Illness  Sarah Perkins is a 82 y.o. female, with PMH of GI bleeding due to diverticular, diverticulitis, dementia, duodenitis, hypertension, diastolic congestive heart failure, GERD, cerebral aneurysm repair 1970, who presents with syncope, and right femur fracture, atient with advanced dementia, history was obtained from son at bedside wwho was present at event, patient was standing from bedside commode, upon standing she braced herself with the handles, and syncopized, Sunday report loss of consciousness 5-10 seconds, she got her head stuck between commode chair and handle, son had to pull the parts of to get her up. Pt sp R ORIF (10/23/15) with NWB orders.    Clinical Impression  Pt admitted with above diagnosis. Pt currently with functional limitations due to the deficits listed below (see PT Problem List).  Pt will benefit from skilled PT to increase their independence and safety with mobility to allow discharge to the venue listed below.  Evaluation limited by pain and Hgb of 7.3. Pt required TOT A for rolling and is NWB on R LE.  Heels floated and left in Semi L sidelying position to decrease chance of skin breakdown as son said pt has had wounds before that required surgical intervention. May need to consider air mattress. Will need SNF for rehab and son in agreement.     Follow Up Recommendations SNF;Supervision/Assistance - 24 hour;Supervision for mobility/OOB    Equipment Recommendations  None recommended by PT    Recommendations for Other Services       Precautions / Restrictions Precautions Precautions: Fall Restrictions Weight Bearing Restrictions: Yes RLE Weight Bearing: Non weight bearing      Mobility  Bed Mobility Overal bed mobility: Needs Assistance;+2 for physical assistance Bed Mobility: Rolling Rolling:  Total assist         General bed mobility comments: Pt resistant to rolling and crying out.  Pt positioned with pillow under R side in a semi-L sidelying position.  Heels floated as well.  Transfers                    Ambulation/Gait                Stairs            Wheelchair Mobility    Modified Rankin (Stroke Patients Only)       Balance Overall balance assessment: History of Falls                                           Pertinent Vitals/Pain Pain Assessment: Faces Faces Pain Scale: Hurts worst Pain Location: R LE Pain Descriptors / Indicators: Crying;Grimacing;Moaning;Operative site guarding Pain Intervention(s): Monitored during session;Repositioned;Premedicated before session;Limited activity within patient's tolerance    Home Living Family/patient expects to be discharged to:: Skilled nursing facility                      Prior Function Level of Independence: Needs assistance   Gait / Transfers Assistance Needed: pt transfers to W/C with son's A.  Minimal ambulation per son.           Hand Dominance        Extremity/Trunk Assessment               Lower Extremity Assessment: RLE  deficits/detail RLE Deficits / Details: Pt will perform ankle pumps with encouragement.  Resistant to any hip/knee flex.  Tolerated PROM hip abd       Communication      Cognition Arousal/Alertness: Awake/alert Behavior During Therapy: WFL for tasks assessed/performed Overall Cognitive Status: History of cognitive impairments - at baseline                      General Comments      Exercises General Exercises - Lower Extremity Ankle Circles/Pumps: AROM;AAROM;Both;10 reps Hip ABduction/ADduction: PROM;Right;10 reps;Supine      Assessment/Plan    PT Assessment Patient needs continued PT services  PT Diagnosis Acute pain;Generalized weakness   PT Problem List Decreased strength;Decreased range of  motion;Decreased activity tolerance;Decreased balance;Pain;Decreased mobility  PT Treatment Interventions Functional mobility training;Therapeutic activities;Therapeutic exercise   PT Goals (Current goals can be found in the Care Plan section) Acute Rehab PT Goals Patient Stated Goal: to go to rehab PT Goal Formulation: With family Time For Goal Achievement: 10/30/15 Potential to Achieve Goals: Fair    Frequency Min 2X/week   Barriers to discharge        Co-evaluation               End of Session   Activity Tolerance: Patient limited by pain Patient left: in bed;with call bell/phone within reach;with bed alarm set;with family/visitor present Nurse Communication: Mobility status         Time: [id number] PT Time Calculation (min) (ACUTE ONLY): 15 min   Charges:   PT Evaluation $PT Eval Moderate Complexity: 1 Procedure     PT G Codes:        Sarah Perkins 10/23/2015, 10:54 AM

## 2015-10-23 NOTE — Care Management Important Message (Signed)
Important Message  Patient Details  Name: Sarah Perkins MRN: [id number] Date of Birth: July 08, 1917   Medicare Important Message Given:  Yes    Loann Quill 10/23/2015, 8:50 AM

## 2015-10-23 NOTE — Evaluation (Signed)
Occupational Therapy Evaluation Patient Details Name: AMMY MCELVEEN MRN: [id number] DOB: 1917-08-18 Today's Date: 10/23/2015    History of Present Illness Adalinn Crochet is a 82 y.o. female, with PMH of GI bleeding due to diverticular, diverticulitis, dementia, duodenitis, hypertension, diastolic congestive heart failure, GERD, cerebral aneurysm repair 1970, who presents with syncope, and right femur fracture, atient with advanced dementia, history was obtained from son at bedside wwho was present at event, patient was standing from bedside commode, upon standing she braced herself with the handles, and syncopized, Sunday report loss of consciousness 5-10 seconds, she got her head stuck between commode chair and handle, son had to pull the parts of to get her up. Pt sp R ORIF (10/23/15) with NWB orders.   Clinical Impression   Pt is dependent in mobility and ADL, with the exception of being able to self feed, at baseline. She presents with R LE pain, decreased activity tolerance and requires 2 person assist for bed level mobility. Pt will need SNF for rehab upon discharge. Will defer pt to SNF.    Follow Up Recommendations  SNF;Supervision/Assistance - 24 hour    Equipment Recommendations       Recommendations for Other Services       Precautions / Restrictions Precautions Precautions: Fall Restrictions Weight Bearing Restrictions: Yes RLE Weight Bearing: Non weight bearing      Mobility Bed Mobility Overal bed mobility: Needs Assistance;+2 for physical assistance Bed Mobility: Rolling Rolling: Total assist         General bed mobility comments: Pt resistant to rolling and crying out.  Pt positioned with pillow under R side in a semi-L sidelying position.  Heels floated as well.  Transfers                      Balance                                            ADL Overall ADL's : Needs assistance/impaired                                        General ADL Comments: Dependent in all ADL. Assisted with drinking. Used straw as pipette and then pt able to drink with cup held for her.     Vision     Perception     Praxis      Pertinent Vitals/Pain Pain Assessment: Faces Faces Pain Scale: Hurts worst Pain Location: R LE  Pain Descriptors / Indicators: Crying;Grimacing;Guarding Pain Intervention(s): Limited activity within patient's tolerance;Monitored during session;Repositioned     Hand Dominance Right   Extremity/Trunk Assessment Upper Extremity Assessment Upper Extremity Assessment: Generalized weakness   Lower Extremity Assessment Lower Extremity Assessment: Defer to PT evaluation       Communication Communication Communication: HOH   Cognition Arousal/Alertness: Awake/alert Behavior During Therapy: Flat affect Overall Cognitive Status: History of cognitive impairments - at baseline                     General Comments       Exercises       Shoulder Instructions      Home Living Family/patient expects to be discharged to:: Skilled nursing facility  Additional Comments: Pt has been living with son who was providing 24 hour assist       Prior Functioning/Environment Level of Independence: Needs assistance  Gait / Transfers Assistance Needed: pt transfers to W/C with son's A.  Minimal ambulation per son. ADL's / Homemaking Assistance Needed: son provides A for bathing and dressing and he performs all homemaking tasks, pt can self feed with set up          OT Diagnosis: Generalized weakness;Acute pain;Cognitive deficits   OT Problem List:     OT Treatment/Interventions:      OT Goals(Current goals can be found in the care plan section) Acute Rehab OT Goals Patient Stated Goal: to go to rehab  OT Frequency:     Barriers to D/C:            Co-evaluation              End of Session    Activity  Tolerance: Patient limited by pain;Patient limited by fatigue;Patient limited by lethargy Patient left: in bed;with call bell/phone within reach;with bed alarm set;with family/visitor present   Time: [id number] OT Time Calculation (min): 30 min Charges:  OT General Charges $OT Visit: 1 Procedure OT Evaluation $OT Eval Moderate Complexity: 1 Procedure OT Treatments $Self Care/Home Management : 8-22 mins G-Codes:    [name] 10/23/2015, 4:30 PM  [phone]

## 2015-10-23 NOTE — Progress Notes (Addendum)
Patient ID: Sarah Perkins, female   DOB: Apr 03, 1918, 82 y.o.   MRN: [id number]  PROGRESS NOTE    Sarah Perkins  [id number] DOB: [birth year] DOA: 10/21/2015  PCP: Thressa Sheller, MD   Brief Narrative:  82 y.o. female, with past medical history of diverticular GI bleeding, diverticulitis, dementia, duodenitis, hypertension, diastolic congestive heart failure, GERD, cerebral aneurysm repair 1970 who presented with syncope. She was apparently standing up from bedside commode and upon standing up she lost consciousness for 5-10 seconds. Pt sustained right distal femur fracture. CT head and cervical spine with no acute intracranial and cervical abnormalities. Orthopedic surgery has seen in her consultation.   Assessment & Plan:   Active Problems:   Femur fracture, right ([place]) acute  - Status post syncopal event - She sustained distal comminuted right femoral metaphysis fracture  - Status post open reduction internal fixation of the right distal femoral fracture - Will need skilled nursing facility placement. Appreciate social work assisting with discharge [place] physical therapy following    Acute postoperative blood loss anemia  - Hemoglobin dropped from 11.8 down to 7.3, postoperatively  - We will give 1 unit of PRBC transfusion and check CBC tomorrow morning  - Stop Lovenox subcutaneous and use SCDs for DVT prophylaxis     Chronic kidney disease stage III - Creatinine at baseline 1.7 and on this admission within normal limit and slightly up this morning 1.02 however overall at baseline    Leukocytosis - Likely reactive from fall - No evidence of acute infectious process    Dementia - Stable    Chronic diastolic CHF (congestive heart failure) (HCC) / Syncope  - Last 2 D ECHO with EF 55% and grade 1 DD - 2-D echo on this admission with normal ejection fraction and grade 1 diastolic dysfunction     Essential hypertension - Continue  norvasc    DVT prophylaxis: Lovenox subQ stopped due to drop in hemoglobin, use SCD's instead  Code Status: DNR/DNI  Family Communication: no family at the bedside, called son [name] over the phone to give update, no VM option to leave VM on his cell phone  Disposition Plan: SNF 6/27 if Hgb stable, at least 8-9   Consultants:   Orthopedic surgery  Procedures:   OPEN REDUCTION INTERNAL FIXATION (ORIF) DISTAL FEMUR FRACTURE (Right) 10/22/2015  1 U PRBC 10/23/2015    2 D ECO 10/22/2015 - EF 65% and grade 1 diastolic dysfunction   Antimicrobials:   None    Subjective: No overnight events.   Objective: Filed Vitals:   10/22/15 1830 10/22/15 2200 10/23/15 0151 10/23/15 0518  BP: 141/63 112/45 123/47 125/56  Pulse: 80 105 108 92  Temp:  98.6 F (37 C) 98.4 F (36.9 C) 98.6 F (37 C)  TempSrc:  Oral Oral Oral  Resp:  20 20 16   Height:      Weight:    60.827 kg (134 lb 1.6 oz)  SpO2: 97% 95% 93% 93%    Intake/Output Summary (Last 24 hours) at 10/23/15 1046 Last data filed at 10/23/15 1000  Gross per 24 hour  Intake   3905 ml  Output    700 ml  Net   3205 ml   Filed Weights   10/21/15 1247 10/21/15 1856 10/23/15 0518  Weight: 61.236 kg (135 lb) 58.877 kg (129 lb 12.8 oz) 60.827 kg (134 lb 1.6 oz)    Examination:  General exam: Appears in no acute distress Respiratory system: Respiratory effort normal. No  wheezing  Cardiovascular system: S1 & S2 heard, rate controlled  Gastrointestinal system: (+) BS, non tender  Central nervous system: No focal neurological deficits. Extremities: no edema, right left wrapped Skin: warm and dry  Psychiatry: Normal mood   Data Reviewed: I have personally reviewed following labs and imaging studies  CBC:  Recent Labs Lab 10/21/15 1445 10/23/15 0426  WBC 14.9* 18.3*  NEUTROABS 12.8*  --   HGB 11.8* 7.3*  HCT 36.8 21.8*  MCV 90.4 90.8  PLT 262 [id number]   Basic Metabolic Panel:  Recent Labs Lab 10/21/15 1445  10/23/15 0426  NA 139 139  K 3.6 3.5  CL 106 107  CO2 24 25  GLUCOSE 143* 129*  BUN 11 20  CREATININE 0.69 1.02*  CALCIUM 9.3 7.9*   GFR: Estimated Creatinine Clearance: 31.7 mL/min (by C-G formula based on Cr of 1.02). Liver Function Tests:  Recent Labs Lab 10/21/15 1445  AST 23  ALT 14  ALKPHOS 48  BILITOT 0.7  PROT 8.8*  ALBUMIN 3.1*   No results for input(s): LIPASE, AMYLASE in the last 168 hours. No results for input(s): AMMONIA in the last 168 hours. Coagulation Profile: No results for input(s): INR, PROTIME in the last 168 hours. Cardiac Enzymes: No results for input(s): CKTOTAL, CKMB, CKMBINDEX, TROPONINI in the last 168 hours. BNP (last 3 results) No results for input(s): PROBNP in the last 8760 hours. HbA1C: No results for input(s): HGBA1C in the last 72 hours. CBG: No results for input(s): GLUCAP in the last 168 hours. Lipid Profile: No results for input(s): CHOL, HDL, LDLCALC, TRIG, CHOLHDL, LDLDIRECT in the last 72 hours. Thyroid Function Tests: No results for input(s): TSH, T4TOTAL, FREET4, T3FREE, THYROIDAB in the last 72 hours. Anemia Panel: No results for input(s): VITAMINB12, FOLATE, FERRITIN, TIBC, IRON, RETICCTPCT in the last 72 hours. Urine analysis:    Component Value Date/Time   COLORURINE YELLOW 10/21/2015 2038   APPEARANCEUR CLEAR 10/21/2015 2038   LABSPEC 1.015 10/21/2015 2038   PHURINE 5.5 10/21/2015 2038   GLUCOSEU NEGATIVE 10/21/2015 2038   HGBUR NEGATIVE 10/21/2015 2038   BILIRUBINUR NEGATIVE 10/21/2015 2038   KETONESUR NEGATIVE 10/21/2015 2038   PROTEINUR NEGATIVE 10/21/2015 2038   UROBILINOGEN 0.2 10/08/2014 1225   NITRITE NEGATIVE 10/21/2015 2038   LEUKOCYTESUR NEGATIVE 10/21/2015 2038   Sepsis Labs: @LABRCNTIP (procalcitonin:4,lacticidven:4)  Recent Results (from the past 240 hour(s))  Surgical pcr screen     Status: None   Collection Time: 10/21/15  8:23 PM  Result Value Ref Range Status   MRSA, PCR NEGATIVE NEGATIVE  Final   Staphylococcus aureus NEGATIVE NEGATIVE Final      Radiology Studies: Dg Chest 1 View 10/21/2015  1. Mild congestive heart failure . 2. Mild left basilar scarring versus atelectasis.   Ct Head Wo Contrast 10/21/2015  Prior anterior skullbase aneurysm clipping. Atrophy with small vessel chronic ischemic changes of deep cerebral white matter. No acute intracranial abnormalities. Osseous demineralization with multilevel degenerative disc and facet disease changes cervical spine. No acute cervical spine abnormalities.  Ct Cervical Spine Wo Contrast 10/21/2015  Prior anterior skullbase aneurysm clipping. Atrophy with small vessel chronic ischemic changes of deep cerebral white matter. No acute intracranial abnormalities. Osseous demineralization with multilevel degenerative disc and facet disease changes cervical spine. No acute cervical spine abnormalities.   Dg Knee Right Port 10/21/2015   Comminuted impaction fracture of the RIGHT femoral metaphysis with angulation.   Dg Hip Unilat With Pelvis 2-3 Views Right 10/21/2015  No acute bone abnormality  in the pelvis or right hip.      Scheduled Meds: . sodium chloride   Intravenous Once  . amLODipine  5 mg Oral Daily  . aspirin EC  325 mg Oral BID PC  . calcium-vitamin D  1 tablet Oral Q breakfast  . docusate sodium  100 mg Oral QHS  . enoxaparin (LOVENOX) injection  30 mg Subcutaneous Q24H  . ferrous sulfate  325 mg Oral Q breakfast  . pantoprazole  40 mg Oral Daily  . polyethylene glycol  17 g Oral Daily  . cyanocobalamin  100 mcg Oral Daily  . vitamin C  500 mg Oral Daily   Continuous Infusions: . sodium chloride 50 mL/hr at 10/22/15 1437  . sodium chloride 75 mL/hr at 10/22/15 1554  . lactated ringers 50 mL/hr at 10/22/15 0958     LOS: 2 days    Time spent: 25 minutes  Greater than 50% of the time spent on counseling and coordinating the care.   Leisa Lenz, MD [facility] Pager [phone]  If 7PM-7AM,  please contact night-coverage [email or web address] Password [facility] 10/23/2015, 10:46 AM

## 2015-10-23 NOTE — Progress Notes (Signed)
Physical Therapy consulted, they recommend SNF placement; soc Worker referral placed; Aneta Mins [phone]

## 2015-10-24 ENCOUNTER — Inpatient Hospital Stay (HOSPITAL_COMMUNITY): Payer: Medicare Other

## 2015-10-24 DIAGNOSIS — E440 Moderate protein-calorie malnutrition: Secondary | ICD-10-CM | POA: Insufficient documentation

## 2015-10-24 LAB — CBC
HEMATOCRIT: 22.1 % — AB (ref 36.0–46.0)
HEMOGLOBIN: 7.3 g/dL — AB (ref 12.0–15.0)
MCH: 29.2 pg (ref 26.0–34.0)
MCHC: 33 g/dL (ref 30.0–36.0)
MCV: 88.4 fL (ref 78.0–100.0)
Platelets: 180 10*3/uL (ref 150–400)
RBC: 2.5 MIL/uL — ABNORMAL LOW (ref 3.87–5.11)
RDW: 16 % — ABNORMAL HIGH (ref 11.5–15.5)
WBC: 14.8 10*3/uL — AB (ref 4.0–10.5)

## 2015-10-24 LAB — BASIC METABOLIC PANEL
ANION GAP: 7 (ref 5–15)
BUN: 24 mg/dL — ABNORMAL HIGH (ref 6–20)
CALCIUM: 8 mg/dL — AB (ref 8.9–10.3)
CHLORIDE: 108 mmol/L (ref 101–111)
CO2: 26 mmol/L (ref 22–32)
Creatinine, Ser: 0.88 mg/dL (ref 0.44–1.00)
GFR calc non Af Amer: 54 mL/min — ABNORMAL LOW (ref >60)
GLUCOSE: 130 mg/dL — AB (ref 65–99)
POTASSIUM: 3.4 mmol/L — AB (ref 3.5–5.1)
Sodium: 141 mmol/L (ref 135–145)

## 2015-10-24 LAB — PREPARE RBC (CROSSMATCH)

## 2015-10-24 MED ORDER — HYDROCODONE-ACETAMINOPHEN 5-325 MG PO TABS: 1.0000 | ORAL_TABLET | Freq: Four times a day (QID) | ORAL | Status: DC | PRN

## 2015-10-24 MED ORDER — ASPIRIN 325 MG PO TBEC: 325.0000 mg | DELAYED_RELEASE_TABLET | Freq: Two times a day (BID) | ORAL | Status: DC

## 2015-10-24 MED ORDER — POTASSIUM CHLORIDE CRYS ER 20 MEQ PO TBCR
40.0000 meq | EXTENDED_RELEASE_TABLET | Freq: Once | ORAL | Status: AC
  Administered 2015-10-24: 40 meq via ORAL
  Filled 2015-10-24: qty 2

## 2015-10-24 MED ORDER — ENSURE ENLIVE PO LIQD
237.0000 mL | Freq: Two times a day (BID) | ORAL | Status: DC
  Administered 2015-10-24 – 2015-10-26 (×3): 237 mL via ORAL

## 2015-10-24 MED ORDER — SODIUM CHLORIDE 0.9 % IV SOLN: Freq: Once | INTRAVENOUS | Status: DC

## 2015-10-24 NOTE — Progress Notes (Signed)
Subjective: 2 Days Post-Op Procedure(s) (LRB): OPEN REDUCTION INTERNAL FIXATION (ORIF) DISTAL FEMUR FRACTURE (Right) Patient reports pain as moderate.  Awake and alert.  Objective: Vital signs in last 24 hours: Temp:  [97.7 F (36.5 C)-100 F (37.8 C)] 97.7 F (36.5 C) (06/27 0600) Pulse Rate:  [97-109] 109 (06/27 0600) Resp:  [18-20] 18 (06/27 0600) BP: (116-137)/(49-54) 137/54 mmHg (06/27 0600) SpO2:  [95 %-98 %] 98 % (06/27 0600) Weight:  [69.763 kg (153 lb 12.8 oz)] 69.763 kg (153 lb 12.8 oz) (06/27 0423)  Intake/Output from previous day: 06/26 0701 - 06/27 0700 In: 710 [P.O.:360] Out: 700 [Urine:700] Intake/Output this shift: Total I/O In: 240 [P.O.:240] Out: -    Recent Labs  10/21/15 1445 10/23/15 0426 10/24/15 0524  HGB 11.8* 7.3* 7.3*    Recent Labs  10/23/15 0426 10/24/15 0524  WBC 18.3* 14.8*  RBC 2.40* 2.50*  HCT 21.8* 22.1*  PLT 201 180    Recent Labs  10/23/15 0426 10/24/15 0524  NA 139 141  K 3.5 3.4*  CL 107 108  CO2 25 26  BUN 20 24*  CREATININE 1.02* 0.88  GLUCOSE 129* 130*  CALCIUM 7.9* 8.0*   No results for input(s): LABPT, INR in the last 72 hours.  Right lower extremity:  Sensation intact distally Intact pulses distally Dorsiflexion/Plantar flexion intact Incision: scant drainage Compartment soft  Assessment/Plan: 2 Days Post-Op Procedure(s) (LRB): OPEN REDUCTION INTERNAL FIXATION (ORIF) DISTAL FEMUR FRACTURE (Right)  Non weight bearing right leg Dressing changed new dressing applied and dressing able to get wet. Dressing can stay in place until follow up with Dr. Ninfa Linden in 2 weeks post-op.   Sarah Perkins 10/24/2015, 11:54 AM

## 2015-10-24 NOTE — Discharge Instructions (Signed)
No weight on her right leg at all for up to 3 months. Can get her incisions wet on the right leg in 6 days. Can get dressing wet . Leave dressing intact until follow up with Dr. Ninfa Linden in 2 weeks.

## 2015-10-24 NOTE — Progress Notes (Signed)
1530 Received report from [place] Verification done. Pt not in Cardiac monitor as discussed by CMT with Lauren Mueler . Verified with DR. Myer . OK to be off cardiac monitor

## 2015-10-24 NOTE — Clinical Social Work Note (Signed)
Clinical Social Work Assessment  Patient Details  Name: Sarah Perkins MRN: [id number] Date of Birth: 30-Mar-1918  Date of referral:  10/24/15               Reason for consult:  Facility Placement                Permission sought to share information with:  Facility Art therapist granted to share information::  Yes, Verbal Permission Granted  Name::     Veterinary surgeon::  SNF's  Relationship::  Son  Contact Information:  [phone]  Housing/Transportation Living arrangements for the past 2 months:  [place] of Information:  Medical Team, Adult Children Patient Interpreter Needed:  None Criminal Activity/Legal Involvement Pertinent to Current Situation/Hospitalization:  No - Comment as needed Significant Relationships:  Adult Children Lives with:  Adult Children Do you feel safe going back to the place where you live?  Yes Need for family participation in patient care:  Yes (Comment)  Care giving concerns:  PT recommending SNF once medically stable for discharge.   Social Worker assessment / plan:  CSW met with patient. Son at bedside. CSW introduced role and explained that discharge planning would be discussed. Patient only oriented to self. Patient's son is aware of SNF recommendation. She has been at [facility] in the past. Discussed top 3 preferences. No further concerns. CSW encouraged patient's son to contact CSW as needed. CSW will continue to follow patient and facilitate discharge to SNF once medically stable.  Employment status:  Retired Forensic scientist:  Medicare PT Recommendations:  [place] / Referral to community resources:  [place]  Patient/Family's Response to care:  Patient not fully oriented. Patient's son agreeable to SNF placement. Patient's son supportive and involved in patient's care. Patient's son appreciated social work intervention.  Patient/Family's  Understanding of and Emotional Response to Diagnosis, Current Treatment, and Prognosis:  Patient not fully oriented. Patient's son understands need for rehab before returning home.   Emotional Assessment Appearance:  Appears stated age Attitude/Demeanor/Rapport:  Unable to Assess Affect (typically observed):  Unable to Assess Orientation:  Oriented to Self Alcohol / Substance use:  Never Used Psych involvement (Current and /or in the community):  No (Comment)  Discharge Needs  Concerns to be addressed:  Care Coordination Readmission within the last 30 days:  No Current discharge risk:  Cognitively Impaired, Dependent with Mobility Barriers to Discharge:  No Barriers Identified   Candie Chroman, LCSW 10/24/2015, 3:25 PM

## 2015-10-24 NOTE — Progress Notes (Signed)
Patient and patient's son educated on signs and symptoms of blood transfusion reaction and to call RN Neta Mends RN 2:14 PM 10-24-2015

## 2015-10-24 NOTE — Clinical Social Work Placement (Signed)
   CLINICAL SOCIAL WORK PLACEMENT  NOTE  Date:  10/24/2015  Patient Details  Name: Sarah Perkins MRN: [id number] Date of Birth: 1918/02/25  Clinical Social Work is seeking post-discharge placement for this patient at the [place] level of care (*CSW will initial, date and re-position this form in  chart as items are completed):  Yes   Patient/family provided with [place] Work Department's list of facilities offering this level of care within the geographic area requested by the patient (or if unable, by the patient's family).  Yes   Patient/family informed of their freedom to choose among providers that offer the needed level of care, that participate in Medicare, Medicaid or managed care program needed by the patient, have an available bed and are willing to accept the patient.  Yes   Patient/family informed of [place]'s ownership interest in [facility] and [facility], as well as of the fact that they are under no obligation to receive care at these facilities.  PASRR submitted to EDS on 10/24/15     PASRR number received on       Existing PASRR number confirmed on 10/24/15     FL2 transmitted to all facilities in geographic area requested by pt/family on 10/24/15     FL2 transmitted to all facilities within larger geographic area on       Patient informed that his/her managed care company has contracts with or will negotiate with certain facilities, including the following:            Patient/family informed of bed offers received.  Patient chooses bed at       Physician recommends and patient chooses bed at      Patient to be transferred to   on  .  Patient to be transferred to facility by       Patient family notified on   of transfer.  Name of family member notified:        PHYSICIAN       Additional Comment:    _______________________________________________ Candie Chroman, LCSW 10/24/2015, 3:27 PM

## 2015-10-24 NOTE — Progress Notes (Signed)
1530 pt has blood  transfusionin progress . Pt no apparent blood transfusion reaction . Pt awake , alert . Oriented to name when asked. Calm and pleasant. With leg immobizer in pace . Neuro vascular intact . No pain noted . Pt'"s son came in to visit. Updated. . Son supportive with care

## 2015-10-24 NOTE — NC FL2 (Signed)
[place] LEVEL OF CARE SCREENING TOOL     IDENTIFICATION  Patient Name: Sarah Perkins Birthdate: 09/05/1917 Sex: female Admission Date (Current Location): 10/21/2015  [facility] and [place] Number:  Herbalist and Address:  The . [facility], [place] [address] [place], [place] [address]      Provider Number: [id number]  Attending Physician Name and Address:  Robbie Lis, MD  Relative Name and Phone Number:       Current Level of Care: Hospital Recommended Level of Care: [place] Prior Approval Number:    Date Approved/Denied:   PASRR Number: [id number]  Discharge Plan: SNF    Current Diagnoses: Patient Active Problem List   Diagnosis Date Noted  . Malnutrition of moderate degree 10/24/2015  . Femur fracture, right ([place]) 10/21/2015  . Duodenal ulcer, acute 06/04/2015  . Essential hypertension 06/02/2015  . Syncope 06/02/2015  . Abdominal pain 06/02/2015  . Elevated lactic acid level 06/02/2015  . Faintness   . Dehydration 03/29/2015  . FTT (failure to thrive) in adult 03/29/2015  . Pressure ulcer 03/23/2015  . CKD (chronic kidney disease) stage 2, GFR 60-89 ml/min 03/23/2015  . Duodenitis without bleeding 03/22/2015  . Azotemia 03/22/2015  . Acute hypokalemia 03/22/2015  . Constipation   . Cough   . Palliative care encounter   . Anemia, iron deficiency 10/08/2014  . Rectal bleeding 08/21/2014  . Chronic diastolic CHF (congestive heart failure) ([place]) 02/22/2014  . GIB (gastrointestinal bleeding) 02/14/2014  . Abnormal EKG 02/14/2014  . Dementia 01/28/2013  . Acute posthemorrhagic anemia 09/20/2011  . Hypertensive heart disease with CHF (congestive heart failure) ([place]) 05/15/2011  . Acute on chronic kidney failure ([place]), stage ii 05/15/2011  . GERD (gastroesophageal reflux disease) 05/15/2011  . Diverticulosis 05/15/2011    Orientation RESPIRATION BLADDER Height & Weight     Self  O2 (Nasal  [place] 3 L) Incontinent Weight: 153 lb 12.8 oz (69.763 kg) Height:  5\' 7"  (170.2 cm)  BEHAVIORAL SYMPTOMS/MOOD NEUROLOGICAL BOWEL NUTRITION STATUS   (None)  (Dementia) Continent Diet (DYS 2)  AMBULATORY STATUS COMMUNICATION OF NEEDS Skin   Extensive Assist Verbally Surgical wounds (Closed incision right thigh)                       Personal Care Assistance Level of Assistance  Bathing, Feeding, Dressing Bathing Assistance: Maximum assistance Feeding assistance: Maximum assistance Dressing Assistance: Maximum assistance     Functional Limitations Info  Sight, Hearing, Speech Sight Info: Adequate Hearing Info: Adequate Speech Info: Adequate    SPECIAL CARE FACTORS FREQUENCY  Blood pressure, PT (By licensed PT), OT (By licensed OT)     PT Frequency: 5 x week OT Frequency: 5 x week            Contractures Contractures Info: Not present    Additional Factors Info  Code Status, Allergies Code Status Info: DNR Allergies Info: NKDA           Current Medications (10/24/2015):  This is the current hospital active medication list Current Facility-Administered Medications  Medication Dose Route Frequency Provider Last Rate Last Dose  . 0.9 %  sodium chloride infusion   Intravenous Continuous Albertine Patricia, MD 50 mL/hr at 10/22/15 1437    . 0.9 %  sodium chloride infusion   Intravenous Once Robbie Lis, MD      . acetaminophen (TYLENOL) tablet 650 mg  650 mg Oral Q6H PRN Lind Guest  Ninfa Linden, MD       Or  . acetaminophen (TYLENOL) suppository 650 mg  650 mg Rectal Q6H PRN Mcarthur Rossetti, MD      . amLODipine (NORVASC) tablet 5 mg  5 mg Oral Daily Albertine Patricia, MD   5 mg at 10/24/15 1028  . aspirin EC tablet 325 mg  325 mg Oral BID PC Mcarthur Rossetti, MD   325 mg at 10/24/15 0839  . calcium-vitamin D (OSCAL WITH D) 500-200 MG-UNIT per tablet 1 tablet  1 tablet Oral Q breakfast Jake Church Masters, [facility]   1 tablet at 10/24/15 [id number]  . docusate  sodium (COLACE) capsule 100 mg  100 mg Oral QHS Silver Huguenin Elgergawy, MD   100 mg at 10/23/15 2310  . famotidine (PEPCID) tablet 20 mg  20 mg Oral Daily PRN Albertine Patricia, MD      . feeding supplement (ENSURE ENLIVE) (ENSURE ENLIVE) liquid 237 mL  237 mL Oral BID BM Robbie Lis, MD   237 mL at 10/24/15 1028  . ferrous sulfate tablet 325 mg  325 mg Oral Q breakfast Albertine Patricia, MD   325 mg at 10/24/15 [id number]  . HYDROcodone-acetaminophen (NORCO/VICODIN) 5-325 MG per tablet 1-2 tablet  1-2 tablet Oral Q6H PRN Silver Huguenin Elgergawy, MD      . menthol-cetylpyridinium (CEPACOL) lozenge 3 mg  1 lozenge Oral PRN Mcarthur Rossetti, MD       Or  . phenol (CHLORASEPTIC) mouth spray 1 spray  1 spray Mouth/Throat PRN Mcarthur Rossetti, MD      . methocarbamol (ROBAXIN) tablet 500 mg  500 mg Oral Q6H PRN Mcarthur Rossetti, MD   500 mg at 10/23/15 0858   Or  . methocarbamol (ROBAXIN) 500 mg in dextrose 5 % 50 mL IVPB  500 mg Intravenous Q6H PRN Mcarthur Rossetti, MD      . metoCLOPramide (REGLAN) tablet 5-10 mg  5-10 mg Oral Q8H PRN Mcarthur Rossetti, MD       Or  . metoCLOPramide (REGLAN) injection 5-10 mg  5-10 mg Intravenous Q8H PRN Mcarthur Rossetti, MD      . morphine 2 MG/ML injection 0.5 mg  0.5 mg Intravenous Q2H PRN Albertine Patricia, MD   0.5 mg at 10/22/15 0009  . ondansetron (ZOFRAN) tablet 4 mg  4 mg Oral Q6H PRN Mcarthur Rossetti, MD       Or  . ondansetron [facility]) injection 4 mg  4 mg Intravenous Q6H PRN Mcarthur Rossetti, MD      . pantoprazole (PROTONIX) EC tablet 40 mg  40 mg Oral Daily Albertine Patricia, MD   40 mg at 10/24/15 1028  . polyethylene glycol (MIRALAX / GLYCOLAX) packet 17 g  17 g Oral Daily Albertine Patricia, MD   17 g at 10/24/15 1028  . vitamin B-12 (CYANOCOBALAMIN) tablet 100 mcg  100 mcg Oral Daily Albertine Patricia, MD   100 mcg at 10/24/15 1028  . vitamin C (ASCORBIC ACID) tablet 500 mg  500 mg Oral Daily Albertine Patricia, MD   500 mg at 10/24/15 1028     Discharge Medications: Please see discharge summary for a list of discharge medications.  Relevant Imaging Results:  Relevant Lab Results:   Additional Information SS#: [id number]  Candie Chroman, LCSW

## 2015-10-24 NOTE — Progress Notes (Addendum)
Patient ID: Sarah Perkins, female   DOB: December 08, 1917, 82 y.o.   MRN: [id number]  PROGRESS NOTE    Sarah Perkins  [id number] DOB: [birth year] DOA: 10/21/2015  PCP: Thressa Sheller, MD   Brief Narrative:  82 y.o. female, with past medical history of diverticular GI bleeding, diverticulitis, dementia, duodenitis, hypertension, diastolic congestive heart failure, GERD, cerebral aneurysm repair 1970 who presented with syncope. She was apparently standing up from bedside commode and upon standing up she lost consciousness for 5-10 seconds. Pt sustained right distal femur fracture. CT head and cervical spine with no acute intracranial and cervical abnormalities. Orthopedic surgery has seen in her consultation. Patient is status post open reduction internal fixation 10/22/2015.   Assessment & Plan:   Active Problems:   Femur fracture, right ([place]) acute  - Status post syncopal event - She sustained distal comminuted right femoral metaphysis fracture  - Status post open reduction internal fixation of the right distal femoral fracture 10/22/2015 - Will need skilled nursing facility placement. Appreciate social work assisting with discharge planning - Appreciate physical therapy recommendations for skilled nursing facility placement    Acute postoperative blood loss anemia  - Hemoglobin dropped from 11.8 down to 7.3, postoperatively  - She has gotten 1 unit of PRBC transfusion 10/23/2015 - Her hemoglobin is still 7.3 this morning. Will give another unit of PRBC transfusion today. - We stopped Lovenox subcutaneous for DVT prophylaxis yesterday and using SCDs for DVT prophylaxis    Chronic kidney disease stage III - Creatinine at baseline 1.7 and on this admission within normal limit and slightly up this morning 1.02 however overall at baseline    Leukocytosis - Likely reactive from fall - No evidence of acute infectious process    Dementia - Stable    Chronic diastolic CHF  (congestive heart failure) (HCC) / Syncope  - 2-D echo on this admission with normal ejection fraction and grade 1 diastolic dysfunction  - Per physical therapy evaluation recommendation for skilled nursing facility placement    Essential hypertension - Continue norvasc    Cough - Intermittent coughing, pt son concerned about it as pt on dysphagia 2 diet - Will get CXR this am    DVT prophylaxis: Lovenox subQ stopped due to drop in hemoglobin, use SCD's instead  Code Status: DNR/DNI  Family Communication: no family at the bedside, called son [name] and spoek with him this am about plan of care  Disposition Plan: SNF 6/28 if hemoglobin 8 or above    Consultants:   Orthopedic surgery  Procedures:   OPEN REDUCTION INTERNAL FIXATION (ORIF) DISTAL FEMUR FRACTURE (Right) 10/22/2015  1 U PRBC 10/23/2015 and 1 U PRBC 10/24/2015  2 D ECO 10/22/2015 - EF 65% and grade 1 diastolic dysfunction   Antimicrobials:   None    Subjective: No overnight events.   Objective: Filed Vitals:   10/23/15 1930 10/23/15 2242 10/24/15 0423 10/24/15 0600  BP:  122/49  137/54  Pulse:  97  109  Temp: 100 F (37.8 C) 99.4 F (37.4 C)  97.7 F (36.5 C)  TempSrc: Oral Oral  Oral  Resp: 19 19  18   Height:      Weight:   69.763 kg (153 lb 12.8 oz)   SpO2: 98% 98%  98%    Intake/Output Summary (Last 24 hours) at 10/24/15 1040 Last data filed at 10/24/15 0926  Gross per 24 hour  Intake    480 ml  Output    700 ml  Net   -  220 ml   Filed Weights   10/21/15 1856 10/23/15 0518 10/24/15 0423  Weight: 58.877 kg (129 lb 12.8 oz) 60.827 kg (134 lb 1.6 oz) 69.763 kg (153 lb 12.8 oz)    Examination:  General exam: calm and comfortable  Respiratory system: No wheezing, no rhonchi  Cardiovascular system: S1 & S2 heard, RRR Gastrointestinal system: (+) BS, non tender, non distended   Central nervous system: Nonfocal  Extremities: no swelling, right knee immobilizer in place  Skin: no lesions or  ulcers; skin is warm and dry  Psychiatry: Normal mood and behavior   Data Reviewed: I have personally reviewed following labs and imaging studies  CBC:  Recent Labs Lab 10/21/15 1445 10/23/15 0426 10/24/15 0524  WBC 14.9* 18.3* 14.8*  NEUTROABS 12.8*  --   --   HGB 11.8* 7.3* 7.3*  HCT 36.8 21.8* 22.1*  MCV 90.4 90.8 88.4  PLT 262 201 [id number]   Basic Metabolic Panel:  Recent Labs Lab 10/21/15 1445 10/23/15 0426 10/24/15 0524  NA 139 139 141  K 3.6 3.5 3.4*  CL 106 107 108  CO2 24 25 26   GLUCOSE 143* 129* 130*  BUN 11 20 24*  CREATININE 0.69 1.02* 0.88  CALCIUM 9.3 7.9* 8.0*   GFR: Estimated Creatinine Clearance: 37.2 mL/min (by C-G formula based on Cr of 0.88). Liver Function Tests:  Recent Labs Lab 10/21/15 1445  AST 23  ALT 14  ALKPHOS 48  BILITOT 0.7  PROT 8.8*  ALBUMIN 3.1*   No results for input(s): LIPASE, AMYLASE in the last 168 hours. No results for input(s): AMMONIA in the last 168 hours. Coagulation Profile: No results for input(s): INR, PROTIME in the last 168 hours. Cardiac Enzymes: No results for input(s): CKTOTAL, CKMB, CKMBINDEX, TROPONINI in the last 168 hours. BNP (last 3 results) No results for input(s): PROBNP in the last 8760 hours. HbA1C: No results for input(s): HGBA1C in the last 72 hours. CBG: No results for input(s): GLUCAP in the last 168 hours. Lipid Profile: No results for input(s): CHOL, HDL, LDLCALC, TRIG, CHOLHDL, LDLDIRECT in the last 72 hours. Thyroid Function Tests: No results for input(s): TSH, T4TOTAL, FREET4, T3FREE, THYROIDAB in the last 72 hours. Anemia Panel: No results for input(s): VITAMINB12, FOLATE, FERRITIN, TIBC, IRON, RETICCTPCT in the last 72 hours. Urine analysis:    Component Value Date/Time   COLORURINE YELLOW 10/21/2015 2038   APPEARANCEUR CLEAR 10/21/2015 2038   LABSPEC 1.015 10/21/2015 2038   PHURINE 5.5 10/21/2015 2038   GLUCOSEU NEGATIVE 10/21/2015 2038   HGBUR NEGATIVE 10/21/2015 2038    BILIRUBINUR NEGATIVE 10/21/2015 2038   KETONESUR NEGATIVE 10/21/2015 2038   PROTEINUR NEGATIVE 10/21/2015 2038   UROBILINOGEN 0.2 10/08/2014 1225   NITRITE NEGATIVE 10/21/2015 2038   LEUKOCYTESUR NEGATIVE 10/21/2015 2038   Sepsis Labs: @LABRCNTIP (procalcitonin:4,lacticidven:4)  Recent Results (from the past 240 hour(s))  Surgical pcr screen     Status: None   Collection Time: 10/21/15  8:23 PM  Result Value Ref Range Status   MRSA, PCR NEGATIVE NEGATIVE Final   Staphylococcus aureus NEGATIVE NEGATIVE Final      Radiology Studies: Dg Chest 1 View 10/21/2015  1. Mild congestive heart failure . 2. Mild left basilar scarring versus atelectasis.   Ct Head Wo Contrast 10/21/2015  Prior anterior skullbase aneurysm clipping. Atrophy with small vessel chronic ischemic changes of deep cerebral white matter. No acute intracranial abnormalities. Osseous demineralization with multilevel degenerative disc and facet disease changes cervical spine. No acute cervical spine abnormalities.  Ct Cervical Spine Wo Contrast 10/21/2015  Prior anterior skullbase aneurysm clipping. Atrophy with small vessel chronic ischemic changes of deep cerebral white matter. No acute intracranial abnormalities. Osseous demineralization with multilevel degenerative disc and facet disease changes cervical spine. No acute cervical spine abnormalities.   Dg Knee Right Port 10/21/2015   Comminuted impaction fracture of the RIGHT femoral metaphysis with angulation.   Dg Hip Unilat With Pelvis 2-3 Views Right 10/21/2015  No acute bone abnormality in the pelvis or right hip.      Scheduled Meds: . sodium chloride   Intravenous Once  . amLODipine  5 mg Oral Daily  . aspirin EC  325 mg Oral BID PC  . calcium-vitamin D  1 tablet Oral Q breakfast  . docusate sodium  100 mg Oral QHS  . feeding supplement (ENSURE ENLIVE)  237 mL Oral BID BM  . ferrous sulfate  325 mg Oral Q breakfast  . pantoprazole  40 mg Oral Daily  .  polyethylene glycol  17 g Oral Daily  . cyanocobalamin  100 mcg Oral Daily  . vitamin C  500 mg Oral Daily   Continuous Infusions: . sodium chloride 50 mL/hr at 10/22/15 1437     LOS: 3 days    Time spent: 25 minutes  Greater than 50% of the time spent on counseling and coordinating the care.   Leisa Lenz, MD [facility] Pager [phone]  If 7PM-7AM, please contact night-coverage [email or web address] Password [email or web address] 10/24/2015, 10:40 AM

## 2015-10-25 DIAGNOSIS — N182 Chronic kidney disease, stage 2 (mild): Secondary | ICD-10-CM

## 2015-10-25 DIAGNOSIS — I5032 Chronic diastolic (congestive) heart failure: Secondary | ICD-10-CM

## 2015-10-25 DIAGNOSIS — S72001F Fracture of unspecified part of neck of right femur, subsequent encounter for open fracture type IIIA, IIIB, or IIIC with routine healing: Secondary | ICD-10-CM

## 2015-10-25 DIAGNOSIS — E440 Moderate protein-calorie malnutrition: Secondary | ICD-10-CM

## 2015-10-25 LAB — TYPE AND SCREEN
ABO/RH(D): B POS
Antibody Screen: NEGATIVE
DONOR AG TYPE: NEGATIVE
Donor AG Type: NEGATIVE
UNIT DIVISION: 0
Unit division: 0

## 2015-10-25 LAB — CBC
HCT: 25.1 % — ABNORMAL LOW (ref 36.0–46.0)
HEMOGLOBIN: 8 g/dL — AB (ref 12.0–15.0)
MCH: 27.9 pg (ref 26.0–34.0)
MCHC: 31.9 g/dL (ref 30.0–36.0)
MCV: 87.5 fL (ref 78.0–100.0)
PLATELETS: 197 10*3/uL (ref 150–400)
RBC: 2.87 MIL/uL — AB (ref 3.87–5.11)
RDW: 16.5 % — ABNORMAL HIGH (ref 11.5–15.5)
WBC: 11.3 10*3/uL — AB (ref 4.0–10.5)

## 2015-10-25 LAB — BASIC METABOLIC PANEL
Anion gap: 6 (ref 5–15)
BUN: 22 mg/dL — AB (ref 6–20)
CO2: 26 mmol/L (ref 22–32)
CREATININE: 0.74 mg/dL (ref 0.44–1.00)
Calcium: 7.9 mg/dL — ABNORMAL LOW (ref 8.9–10.3)
Chloride: 109 mmol/L (ref 101–111)
GFR calc Af Amer: >60 mL/min (ref >60)
GLUCOSE: 109 mg/dL — AB (ref 65–99)
POTASSIUM: 3.9 mmol/L (ref 3.5–5.1)
SODIUM: 141 mmol/L (ref 135–145)

## 2015-10-25 MED ORDER — FUROSEMIDE 20 MG PO TABS
20.0000 mg | ORAL_TABLET | Freq: Every day | ORAL | Status: DC
  Administered 2015-10-25 – 2015-10-26 (×2): 20 mg via ORAL
  Filled 2015-10-25 (×2): qty 1

## 2015-10-25 NOTE — Progress Notes (Signed)
Patient ID: Sarah Perkins, female   DOB: [birth year], 82 y.o.   MRN: [id number]  PROGRESS NOTE    PINKI BIVIANO  [id number] DOB: [birth year] DOA: 10/21/2015  PCP: Thressa Sheller, MD   Brief Narrative:  82 y.o. female, with past medical history of diverticular GI bleeding, diverticulitis, dementia, duodenitis, hypertension, diastolic congestive heart failure, GERD, cerebral aneurysm repair 1970 who presented with syncope. She was apparently standing up from bedside commode and upon standing up she lost consciousness for 5-10 seconds. Pt sustained right distal femur fracture. CT head and cervical spine with no acute intracranial and cervical abnormalities. Orthopedic surgery has seen in her consultation. Patient is status post open reduction internal fixation 10/22/2015.  Assessment & Plan:   Active Problems:   Femur fracture, right (HCC) acute  - Status post syncopal event - She sustained distal comminuted right femoral metaphysis fracture  - Status post open reduction internal fixation of the right distal femoral fracture 10/22/2015 - Pt eval completed, SNF recommended   Acute postoperative blood loss anemia  - Hemoglobin dropped from 11.8 down to 7.3, postoperatively  - s/p 1 unit of PRBC transfusion 10/23/2015 and 1 unit 6/28 - Dr.Devine stopped Lovenox subcutaneous for DVT prophylaxis due to drop in Hb and using SCDs for DVT prophylaxis    Chronic kidney disease stage III - Creatinine at baseline 1.7 and on this admission within normal limit and slightly up this morning 1.02 however overall at baseline    Leukocytosis - Likely reactive from fall - No evidence of acute infectious process    Dementia - Stable    Chronic diastolic CHF (congestive heart failure) ([place]) / Syncope  - 2-D echo on this admission with normal ejection fraction and grade 1 diastolic dysfunction  - Per physical therapy evaluation recommendation for skilled nursing facility placement -resume  PO lasix    Essential hypertension - Continue norvasc   Cough/atelectasis -resume lasix, Incentive spirometry as tolerated  DVT prophylaxis: Lovenox subQ stopped due to drop in hemoglobin, use SCD's instead  Code Status: DNR/DNI  Family Communication: d/w son charles Disposition Plan: SNF 6/29 if stable   Consultants:   Orthopedic surgery  Procedures:   OPEN REDUCTION INTERNAL FIXATION (ORIF) DISTAL FEMUR FRACTURE (Right) 10/22/2015  1 U PRBC 10/23/2015 and 1 U PRBC 10/24/2015  2 D ECO 10/22/2015 - EF 65% and grade 1 diastolic dysfunction   Antimicrobials:   None    Subjective: No overnight events.   Objective: Filed Vitals:   10/24/15 2050 10/25/15 0532 10/25/15 1055 10/25/15 1215  BP: 129/52  145/59 131/52  Pulse: 92   96  Temp: 98.1 F (36.7 C)   98.4 F (36.9 C)  TempSrc: Oral   Oral  Resp: 18   18  Height:      Weight:  61.417 kg (135 lb 6.4 oz)    SpO2: 96%   94%    Intake/Output Summary (Last 24 hours) at 10/25/15 1449 Last data filed at 10/25/15 1300  Gross per 24 hour  Intake    815 ml  Output      0 ml  Net    815 ml   Filed Weights   10/23/15 0518 10/24/15 0423 10/25/15 0532  Weight: 60.827 kg (134 lb 1.6 oz) 69.763 kg (153 lb 12.8 oz) 61.417 kg (135 lb 6.4 oz)    Examination:  General exam: calm and comfortable  Respiratory system: No wheezing, no rhonchi  Cardiovascular system: S1 & S2 heard, RRR Gastrointestinal system: (+) BS,  non tender, non distended   Central nervous system: Nonfocal  Extremities: no swelling, right knee immobilizer in place  Skin: no lesions or ulcers; skin is warm and dry  Psychiatry: Normal mood and behavior   Data Reviewed: I have personally reviewed following labs and imaging studies  CBC:  Recent Labs Lab 10/21/15 1445 10/23/15 0426 10/24/15 0524 10/25/15 0400  WBC 14.9* 18.3* 14.8* 11.3*  NEUTROABS 12.8*  --   --   --   HGB 11.8* 7.3* 7.3* 8.0*  HCT 36.8 21.8* 22.1* 25.1*  MCV 90.4 90.8 88.4  87.5  PLT 262 201 180 [id number]   Basic Metabolic Panel:  Recent Labs Lab 10/21/15 1445 10/23/15 0426 10/24/15 0524 10/25/15 0400  NA 139 139 141 141  K 3.6 3.5 3.4* 3.9  CL 106 107 108 109  CO2 24 25 26 26   GLUCOSE 143* 129* 130* 109*  BUN 11 20 24* 22*  CREATININE 0.69 1.02* 0.88 0.74  CALCIUM 9.3 7.9* 8.0* 7.9*   GFR: Estimated Creatinine Clearance: 40.8 mL/min (by C-G formula based on Cr of 0.74). Liver Function Tests:  Recent Labs Lab 10/21/15 1445  AST 23  ALT 14  ALKPHOS 48  BILITOT 0.7  PROT 8.8*  ALBUMIN 3.1*   No results for input(s): LIPASE, AMYLASE in the last 168 hours. No results for input(s): AMMONIA in the last 168 hours. Coagulation Profile: No results for input(s): INR, PROTIME in the last 168 hours. Cardiac Enzymes: No results for input(s): CKTOTAL, CKMB, CKMBINDEX, TROPONINI in the last 168 hours. BNP (last 3 results) No results for input(s): PROBNP in the last 8760 hours. HbA1C: No results for input(s): HGBA1C in the last 72 hours. CBG: No results for input(s): GLUCAP in the last 168 hours. Lipid Profile: No results for input(s): CHOL, HDL, LDLCALC, TRIG, CHOLHDL, LDLDIRECT in the last 72 hours. Thyroid Function Tests: No results for input(s): TSH, T4TOTAL, FREET4, T3FREE, THYROIDAB in the last 72 hours. Anemia Panel: No results for input(s): VITAMINB12, FOLATE, FERRITIN, TIBC, IRON, RETICCTPCT in the last 72 hours. Urine analysis:    Component Value Date/Time   COLORURINE YELLOW 10/21/2015 2038   APPEARANCEUR CLEAR 10/21/2015 2038   LABSPEC 1.015 10/21/2015 2038   PHURINE 5.5 10/21/2015 2038   GLUCOSEU NEGATIVE 10/21/2015 2038   HGBUR NEGATIVE 10/21/2015 2038   BILIRUBINUR NEGATIVE 10/21/2015 2038   KETONESUR NEGATIVE 10/21/2015 2038   PROTEINUR NEGATIVE 10/21/2015 2038   UROBILINOGEN 0.2 10/08/2014 1225   NITRITE NEGATIVE 10/21/2015 2038   LEUKOCYTESUR NEGATIVE 10/21/2015 2038   Sepsis  Labs: @LABRCNTIP (procalcitonin:4,lacticidven:4)  Recent Results (from the past 240 hour(s))  Surgical pcr screen     Status: None   Collection Time: 10/21/15  8:23 PM  Result Value Ref Range Status   MRSA, PCR NEGATIVE NEGATIVE Final   Staphylococcus aureus NEGATIVE NEGATIVE Final      Radiology Studies: Dg Chest 1 View 10/21/2015  1. Mild congestive heart failure . 2. Mild left basilar scarring versus atelectasis.   Ct Head Wo Contrast 10/21/2015  Prior anterior skullbase aneurysm clipping. Atrophy with small vessel chronic ischemic changes of deep cerebral white matter. No acute intracranial abnormalities. Osseous demineralization with multilevel degenerative disc and facet disease changes cervical spine. No acute cervical spine abnormalities.  Ct Cervical Spine Wo Contrast 10/21/2015  Prior anterior skullbase aneurysm clipping. Atrophy with small vessel chronic ischemic changes of deep cerebral white matter. No acute intracranial abnormalities. Osseous demineralization with multilevel degenerative disc and facet disease changes cervical spine. No acute cervical  spine abnormalities.   Dg Knee Right Port 10/21/2015   Comminuted impaction fracture of the RIGHT femoral metaphysis with angulation.   Dg Hip Unilat With Pelvis 2-3 Views Right 10/21/2015  No acute bone abnormality in the pelvis or right hip.      Scheduled Meds: . sodium chloride   Intravenous Once  . amLODipine  5 mg Oral Daily  . aspirin EC  325 mg Oral BID PC  . calcium-vitamin D  1 tablet Oral Q breakfast  . docusate sodium  100 mg Oral QHS  . feeding supplement (ENSURE ENLIVE)  237 mL Oral BID BM  . ferrous sulfate  325 mg Oral Q breakfast  . furosemide  20 mg Oral Daily  . pantoprazole  40 mg Oral Daily  . polyethylene glycol  17 g Oral Daily  . cyanocobalamin  100 mcg Oral Daily  . vitamin C  500 mg Oral Daily   Continuous Infusions:     LOS: 4 days    Time spent: 25 minutes  Greater than 50% of the  time spent on counseling and coordinating the care.   Domenic Polite, MD [facility] Pager [phone]  If 7PM-7AM, please contact night-coverage [email or web address] Password [facility] 10/25/2015, 2:49 PM

## 2015-10-25 NOTE — Care Management Important Message (Signed)
Important Message  Patient Details  Name: Sarah Perkins MRN: [id number] Date of Birth: [birth year]   Medicare Important Message Given:  Yes    Loann Quill 10/25/2015, 10:05 AM

## 2015-10-25 NOTE — Clinical Social Work Note (Signed)
CSW met with patient and her son. Patient's son accepts bed offer at [facility]. MD stated that plan is for discharge tomorrow. Facility notified.  Dayton Scrape, [employer]

## 2015-10-26 ENCOUNTER — Other Ambulatory Visit: Payer: Self-pay

## 2015-10-26 DIAGNOSIS — Z9181 History of falling: Secondary | ICD-10-CM | POA: Diagnosis not present

## 2015-10-26 DIAGNOSIS — I10 Essential (primary) hypertension: Secondary | ICD-10-CM | POA: Diagnosis not present

## 2015-10-26 DIAGNOSIS — Z79899 Other long term (current) drug therapy: Secondary | ICD-10-CM | POA: Diagnosis not present

## 2015-10-26 DIAGNOSIS — R531 Weakness: Secondary | ICD-10-CM | POA: Diagnosis present

## 2015-10-26 DIAGNOSIS — G301 Alzheimer's disease with late onset: Secondary | ICD-10-CM | POA: Diagnosis not present

## 2015-10-26 DIAGNOSIS — R55 Syncope and collapse: Secondary | ICD-10-CM | POA: Diagnosis not present

## 2015-10-26 DIAGNOSIS — R2689 Other abnormalities of gait and mobility: Secondary | ICD-10-CM | POA: Diagnosis not present

## 2015-10-26 DIAGNOSIS — R2681 Unsteadiness on feet: Secondary | ICD-10-CM | POA: Diagnosis not present

## 2015-10-26 DIAGNOSIS — N183 Chronic kidney disease, stage 3 (moderate): Secondary | ICD-10-CM | POA: Diagnosis not present

## 2015-10-26 DIAGNOSIS — J209 Acute bronchitis, unspecified: Secondary | ICD-10-CM | POA: Diagnosis not present

## 2015-10-26 DIAGNOSIS — S7291XS Unspecified fracture of right femur, sequela: Secondary | ICD-10-CM | POA: Diagnosis not present

## 2015-10-26 DIAGNOSIS — S7291XA Unspecified fracture of right femur, initial encounter for closed fracture: Secondary | ICD-10-CM | POA: Diagnosis not present

## 2015-10-26 DIAGNOSIS — I5033 Acute on chronic diastolic (congestive) heart failure: Secondary | ICD-10-CM | POA: Diagnosis not present

## 2015-10-26 DIAGNOSIS — R918 Other nonspecific abnormal finding of lung field: Secondary | ICD-10-CM | POA: Diagnosis not present

## 2015-10-26 DIAGNOSIS — F0390 Unspecified dementia without behavioral disturbance: Secondary | ICD-10-CM

## 2015-10-26 DIAGNOSIS — I509 Heart failure, unspecified: Secondary | ICD-10-CM | POA: Diagnosis not present

## 2015-10-26 DIAGNOSIS — S72401D Unspecified fracture of lower end of right femur, subsequent encounter for closed fracture with routine healing: Secondary | ICD-10-CM | POA: Diagnosis not present

## 2015-10-26 DIAGNOSIS — R1312 Dysphagia, oropharyngeal phase: Secondary | ICD-10-CM | POA: Diagnosis not present

## 2015-10-26 DIAGNOSIS — R262 Difficulty in walking, not elsewhere classified: Secondary | ICD-10-CM | POA: Diagnosis not present

## 2015-10-26 DIAGNOSIS — M6281 Muscle weakness (generalized): Secondary | ICD-10-CM | POA: Diagnosis not present

## 2015-10-26 DIAGNOSIS — I5032 Chronic diastolic (congestive) heart failure: Secondary | ICD-10-CM | POA: Diagnosis not present

## 2015-10-26 DIAGNOSIS — I480 Paroxysmal atrial fibrillation: Secondary | ICD-10-CM | POA: Diagnosis not present

## 2015-10-26 DIAGNOSIS — D649 Anemia, unspecified: Secondary | ICD-10-CM | POA: Diagnosis not present

## 2015-10-26 DIAGNOSIS — I110 Hypertensive heart disease with heart failure: Secondary | ICD-10-CM | POA: Diagnosis not present

## 2015-10-26 DIAGNOSIS — Z09 Encounter for follow-up examination after completed treatment for conditions other than malignant neoplasm: Secondary | ICD-10-CM | POA: Diagnosis not present

## 2015-10-26 DIAGNOSIS — S301XXA Contusion of abdominal wall, initial encounter: Secondary | ICD-10-CM | POA: Diagnosis not present

## 2015-10-26 DIAGNOSIS — D500 Iron deficiency anemia secondary to blood loss (chronic): Secondary | ICD-10-CM | POA: Diagnosis not present

## 2015-10-26 DIAGNOSIS — S72021A Displaced fracture of epiphysis (separation) (upper) of right femur, initial encounter for closed fracture: Secondary | ICD-10-CM | POA: Diagnosis not present

## 2015-10-26 DIAGNOSIS — S72451D Displaced supracondylar fracture without intracondylar extension of lower end of right femur, subsequent encounter for closed fracture with routine healing: Secondary | ICD-10-CM | POA: Diagnosis not present

## 2015-10-26 DIAGNOSIS — Z4789 Encounter for other orthopedic aftercare: Secondary | ICD-10-CM | POA: Diagnosis not present

## 2015-10-26 DIAGNOSIS — R69 Illness, unspecified: Secondary | ICD-10-CM | POA: Diagnosis not present

## 2015-10-26 DIAGNOSIS — S728X9A Other fracture of unspecified femur, initial encounter for closed fracture: Secondary | ICD-10-CM | POA: Diagnosis not present

## 2015-10-26 DIAGNOSIS — D72829 Elevated white blood cell count, unspecified: Secondary | ICD-10-CM | POA: Diagnosis not present

## 2015-10-26 DIAGNOSIS — K219 Gastro-esophageal reflux disease without esophagitis: Secondary | ICD-10-CM | POA: Diagnosis not present

## 2015-10-26 DIAGNOSIS — R05 Cough: Secondary | ICD-10-CM | POA: Diagnosis not present

## 2015-10-26 DIAGNOSIS — S72001D Fracture of unspecified part of neck of right femur, subsequent encounter for closed fracture with routine healing: Secondary | ICD-10-CM | POA: Diagnosis not present

## 2015-10-26 LAB — BASIC METABOLIC PANEL
ANION GAP: 6 (ref 5–15)
BUN: 23 mg/dL — ABNORMAL HIGH (ref 6–20)
CALCIUM: 8.3 mg/dL — AB (ref 8.9–10.3)
CO2: 26 mmol/L (ref 22–32)
Chloride: 108 mmol/L (ref 101–111)
Creatinine, Ser: 0.62 mg/dL (ref 0.44–1.00)
GFR calc non Af Amer: >60 mL/min (ref >60)
Glucose, Bld: 117 mg/dL — ABNORMAL HIGH (ref 65–99)
Potassium: 4.7 mmol/L (ref 3.5–5.1)
Sodium: 140 mmol/L (ref 135–145)

## 2015-10-26 LAB — CBC
HCT: 27 % — ABNORMAL LOW (ref 36.0–46.0)
HEMOGLOBIN: 9 g/dL — AB (ref 12.0–15.0)
MCH: 29.2 pg (ref 26.0–34.0)
MCHC: 33.3 g/dL (ref 30.0–36.0)
MCV: 87.7 fL (ref 78.0–100.0)
Platelets: 226 10*3/uL (ref 150–400)
RBC: 3.08 MIL/uL — AB (ref 3.87–5.11)
RDW: 15.8 % — ABNORMAL HIGH (ref 11.5–15.5)
WBC: 14.6 10*3/uL — ABNORMAL HIGH (ref 4.0–10.5)

## 2015-10-26 MED ORDER — ENOXAPARIN SODIUM 30 MG/0.3ML ~~LOC~~ SOLN: 30.0000 mg | SUBCUTANEOUS | Status: DC

## 2015-10-26 MED ORDER — FUROSEMIDE 20 MG PO TABS: 10.0000 mg | ORAL_TABLET | Freq: Every day | ORAL | Status: DC

## 2015-10-26 MED ORDER — ACETAMINOPHEN 325 MG PO TABS
650.0000 mg | ORAL_TABLET | Freq: Four times a day (QID) | ORAL | Status: AC | PRN
Start: 2015-10-26 — End: ?

## 2015-10-26 MED ORDER — HYDROCODONE-ACETAMINOPHEN 5-325 MG PO TABS: 1.0000 | ORAL_TABLET | Freq: Four times a day (QID) | ORAL | Status: DC | PRN

## 2015-10-26 NOTE — Clinical Social Work Note (Signed)
CSW facilitated patient discharge including contacting patient family and facility to confirm patient discharge plans. Clinical information faxed to facility and family agreeable with plan. CSW arranged ambulance transport via [place] to [facility]. RN to call report prior to discharge ([phone]).  CSW will sign off for now as social work intervention is no longer needed. Please consult [place] again if new needs arise.  Dayton Scrape, [place]

## 2015-10-26 NOTE — Progress Notes (Signed)
Pt d/c to SNF via ambulance.  Karie Kirks, Therapist, sports.

## 2015-10-26 NOTE — Consult Note (Signed)
   [facility] CM Inpatient Consult   10/26/2015  Sarah Perkins May 02, 1917 [id number]   Referral was received from MD office prior to patient's admission for community resource and care management services.  Patient evaluated for community based chronic disease management services with [place] [facility] as a benefit of patient's [employer].  Patient is a 82 y.o. female, with past medical history of diverticular GI bleeding, diverticulitis, dementia, duodenitis, hypertension, diastolic congestive heart failure, GERD, HX of cerebral aneurysm repair admitted with syncope per chart review.  Pt sustained right distal femur fracture. Spoke with patient's son, [name],  at bedside to explain [place] [facility] services. Charles endorses Dr. Thressa Sheller to be the patient's primary care provider.  Son expressed ongoing support needed when and 'if' she returns home with community resources, transportation had been an issue due to son's inability to get her into the vehicle for appointments. He states that he would like to sign her up for the services anticipating her return home after her rehab is done.   Consent obtained for services.   Patient will receive post hospital discharge call and will be evaluated for monthly home visits for assessments and disease process education.  Left contact information and [facility] literature at bedside. Made Inpatient Case Manager aware that [place] [facility] following. Of note, [facility] Care Management services does not replace or interfere with any services that are arranged by inpatient case management or social work.  For additional questions or referrals please contact:    Natividad Brood, RN BSN [facility] Liaison  [phone] business mobile phone Toll free office [phone]

## 2015-10-26 NOTE — Discharge Summary (Signed)
Physician Discharge Summary  Sarah Perkins [id number] DOB: 1917-06-19 DOA: 10/21/2015  PCP: Thressa Sheller, MD  Admit date: 10/21/2015 Discharge date: 10/26/2015  Time spent: 45 minutes  Recommendations for Outpatient Follow-up:  1-PCP Dr.Mackenzie in 1 week, please check CBC 2. She is on Lovenox 30mg  SQ daily for DVT prophylaxis for 2weeks only, stop this after 2 weeks, please monitor Hb and platelet count and stop Lovenox if either trends down. 3. Please Use incentive spirometry Q hour when awake 4. Per ORtho: Non weight bearing right leg: New dressing applied and dressing able to get wet. Dressing can stay in place until follow up with Dr. Ninfa Linden in 2 weeks post-op  Discharge Diagnoses:    Distal Femur fracture   Dementia   Chronic diastolic CHF (congestive heart failure) (HCC)   CKD (chronic kidney disease) stage 2, GFR 60-89 ml/min   Syncope   Femur fracture, right (HCC)   Malnutrition of moderate degree   Closed fracture of right femur (HCC)   Atelectasis  Discharge Condition: stable  Diet recommendation: low sodium, heart healthy  Filed Weights   10/24/15 0423 10/25/15 0532 10/26/15 0419  Weight: 69.763 kg (153 lb 12.8 oz) 61.417 kg (135 lb 6.4 oz) 62.37 kg (137 lb 8 oz)    History of present illness:  Sarah Perkins is a 82 y.o. female, with PMH of GI bleeding due to diverticular, diverticulitis, dementia, duodenitis, hypertension, diastolic congestive heart failure, GERD, cerebral aneurysm repair 1970, who presented with syncope, and right femur fracture, patient with advanced dementia, history was obtained from son at bedside who was present at event, patient was standing from bedside commode, upon standing she braced herself with the handles, and syncopized, Sunday report loss of consciousness 5-10 seconds, she got her head stuck between commode chair and handle, son had to pull the parts of to get her up. she was complaining of significant right leg  pain after fall. In ED: x-ray significant for distal femur fracture  Hospital Course:  Femur fracture, right ([facility]) acute  - Status post syncopal event - She sustained distal comminuted right femoral metaphysis fracture  - Status post open reduction internal fixation of the right distal femoral fracture 10/22/2015 - Pt eval completed, SNF recommended -Lovenox for DVT prophylaxis for 2 weeks  Acute postoperative blood loss anemia  - Hemoglobin dropped  to 7.3, postoperatively, baseline hb is 8-8.5 - s/p 1 unit of PRBC transfusion 10/23/2015 and 1 unit 6/28 - Hb stable now - mild reactive leukocytosis noted too   Chronic kidney disease stage III - Creatinine at baseline 1.7 and now normal   Syncope -workup unremarkable   Dementia - Stable   Chronic diastolic CHF (congestive heart failure) ([place]) / Syncope  - 2-D echo on this admission with normal ejection fraction and grade 1 diastolic dysfunction - Per physical therapy evaluation recommendation for skilled nursing facility placement -resumed PO lasix 10mg  daily like she took at home   Essential hypertension - Continue norvasc  Cough/atelectasis -resumed lasix, Incentive spirometry as tolerated -no fever, improving, no Abx recommended at this time  Procedures: 6/25: OPEN REDUCTION INTERNAL FIXATION (ORIF) DISTAL FEMUR FRACTURE (Right)  Consultations:  Ortho Dr.Blackman  Discharge Exam: Filed Vitals:   10/26/15 0419 10/26/15 1020  BP: 125/52 138/87  Pulse: 98   Temp: 98.5 F (36.9 C)   Resp: 18     General: AAOx2 Cardiovascular: S1S2/RRR Respiratory: CTAB  Discharge Instructions   Discharge Instructions    AMB Referral to [facility]  Complete by:  As directed   Reason for consult:  Hospital admission, Original referral from MD office for services  Expected date of contact:  1-3 days (reserved for hospital discharges)  This was originally referred from MD office and assigned to Telephonic  Nurse Please assign patient to social worker for post hospital follow up at skilled facility, DC plan is for [facility]. Please assign to community nurse for transition of care calls and assess for home visits. Questions please call:   Natividad Brood, RN BSN [facility] Liaison  [phone] business mobile phone Toll free office [phone]     Diet - low sodium heart healthy    Complete by:  As directed      Increase activity slowly    Complete by:  As directed           Current Discharge Medication List    START taking these medications   Details  acetaminophen (TYLENOL) 325 MG tablet Take 2 tablets (650 mg total) by mouth every 6 (six) hours as needed for mild pain or moderate pain (or Fever >/= 101).    enoxaparin (LOVENOX) 30 MG/0.3ML injection Inject 0.3 mLs (30 mg total) into the skin daily. For 2 weeks for DVT prophylaxis Qty: 0 Syringe    HYDROcodone-acetaminophen (NORCO/VICODIN) 5-325 MG tablet Take 1 tablet by mouth every 6 (six) hours as needed for severe pain (for severe pain only). Qty: 15 tablet, Refills: 0      CONTINUE these medications which have CHANGED   Details  furosemide (LASIX) 20 MG tablet Take 0.5 tablets (10 mg total) by mouth daily. Qty: 1 tablet, Refills: 0      CONTINUE these medications which have NOT CHANGED   Details  amLODipine (NORVASC) 5 MG tablet Take 5 mg by mouth daily.    Calcium-Magnesium-Vitamin D 185-50-100 MG-MG-UNIT CAPS Take 1 capsule by mouth daily.    cyanocobalamin 100 MCG tablet Take 100 mcg by mouth daily.    docusate sodium (COLACE) 100 MG capsule Take 100 mg by mouth at bedtime.    ferrous sulfate 325 (65 FE) MG tablet Take 325 mg by mouth daily with breakfast.    pantoprazole (PROTONIX) 40 MG tablet Take 1 tablet (40 mg total) by mouth daily. Qty: 30 tablet, Refills: 0    polyethylene glycol (MIRALAX / GLYCOLAX) packet Take 17 g by mouth every morning.    ranitidine (ZANTAC) 150 MG tablet  Take 150 mg by mouth 2 (two) times daily as needed for heartburn.    vitamin C (ASCORBIC ACID) 500 MG tablet Take 500 mg by mouth daily.       No Known Allergies Follow-up Information    Follow up with Mcarthur Rossetti, MD. Schedule an appointment as soon as possible for a visit in 2 weeks.   Specialty:  Orthopedic Surgery   Contact information:   [place] [address] [phone]       Follow up with [facility].   Specialty:  [place] information:   [address] [place] [phone]       The results of significant diagnostics from this hospitalization (including imaging, microbiology, ancillary and laboratory) are listed below for reference.    Significant Diagnostic Studies: Dg Chest 1 View  10/21/2015  CLINICAL DATA:  Syncope.  Pain. EXAM: CHEST 1 VIEW COMPARISON:  06/01/2015 chest radiograph. FINDINGS: Stable cardiomediastinal silhouette with mild cardiomegaly. No pneumothorax. No pleural effusion. Mild pulmonary  edema. Mild left basilar scarring versus atelectasis. IMPRESSION: 1. Mild congestive heart failure . 2. Mild left basilar scarring versus atelectasis. Electronically Signed   By: Ilona Sorrel M.D.   On: 10/21/2015 13:58   Ct Head Wo Contrast  10/21/2015  CLINICAL DATA:  [place] and struck LEFT frontal aspect of head on corner of bath tub, headache, pain in back of neck, CHF, hypertension, prior cerebral aneurysm repair EXAM: CT HEAD WITHOUT CONTRAST CT CERVICAL SPINE WITHOUT CONTRAST TECHNIQUE: Multidetector CT imaging of the head and cervical spine was performed following the standard protocol without intravenous contrast. Multiplanar CT image reconstructions of the cervical spine were also generated. COMPARISON:  CT head 03/29/2011 FINDINGS: CT HEAD FINDINGS Generalized atrophy. Prominent ventricular system, upper normal for degree of atrophy, stable. No midline shift or mass  effect. Extensive small vessel chronic ischemic changes of deep cerebral white matter. No intracranial hemorrhage, mass lesion, or evidence acute infarction. No acute extra-axial fluid collections. Beam hardening artifacts at anterior skullbase from aneurysm clips question at anterior cerebral arteries. Prior RIGHT frontal craniotomy. Bones and sinuses otherwise unremarkable. CT CERVICAL SPINE FINDINGS Diffuse osseous demineralization. Scattered disc space narrowing and endplate spur formation throughout cervical spine. Multilevel degenerative facet disease changes of the cervical spine. Visualized skullbase intact. Vertebral body heights maintained without fracture or subluxation. Biapical lung scarring. Atherosclerotic calcifications within the carotid and vertebral arteries bilaterally. IMPRESSION: Prior anterior skullbase aneurysm clipping. Atrophy with small vessel chronic ischemic changes of deep cerebral white matter. No acute intracranial abnormalities. Osseous demineralization with multilevel degenerative disc and facet disease changes cervical spine. No acute cervical spine abnormalities. Electronically Signed   By: Lavonia Dana M.D.   On: 10/21/2015 14:28   Ct Cervical Spine Wo Contrast  10/21/2015  CLINICAL DATA:  [place] and struck LEFT frontal aspect of head on corner of bath tub, headache, pain in back of neck, CHF, hypertension, prior cerebral aneurysm repair EXAM: CT HEAD WITHOUT CONTRAST CT CERVICAL SPINE WITHOUT CONTRAST TECHNIQUE: Multidetector CT imaging of the head and cervical spine was performed following the standard protocol without intravenous contrast. Multiplanar CT image reconstructions of the cervical spine were also generated. COMPARISON:  CT head 03/29/2011 FINDINGS: CT HEAD FINDINGS Generalized atrophy. Prominent ventricular system, upper normal for degree of atrophy, stable. No midline shift or mass effect. Extensive small vessel chronic ischemic changes of deep cerebral white  matter. No intracranial hemorrhage, mass lesion, or evidence acute infarction. No acute extra-axial fluid collections. Beam hardening artifacts at anterior skullbase from aneurysm clips question at anterior cerebral arteries. Prior RIGHT frontal craniotomy. Bones and sinuses otherwise unremarkable. CT CERVICAL SPINE FINDINGS Diffuse osseous demineralization. Scattered disc space narrowing and endplate spur formation throughout cervical spine. Multilevel degenerative facet disease changes of the cervical spine. Visualized skullbase intact. Vertebral body heights maintained without fracture or subluxation. Biapical lung scarring. Atherosclerotic calcifications within the carotid and vertebral arteries bilaterally. IMPRESSION: Prior anterior skullbase aneurysm clipping. Atrophy with small vessel chronic ischemic changes of deep cerebral white matter. No acute intracranial abnormalities. Osseous demineralization with multilevel degenerative disc and facet disease changes cervical spine. No acute cervical spine abnormalities. Electronically Signed   By: Lavonia Dana M.D.   On: 10/21/2015 14:28   Dg Chest Port 1 View  10/24/2015  CLINICAL DATA:  Cough EXAM: PORTABLE CHEST 1 VIEW COMPARISON:  10/21/2015 FINDINGS: Cardiac shadow is stable. Aortic calcifications are again identified. Lungs are well aerated bilaterally. Some increased parenchymal density is in the left retrocardiac region with new left pleural effusion.  No acute bony abnormality is seen. IMPRESSION: Slight increase in the degree of left basilar density. Aortic atherosclerotic disease. Electronically Signed   By: Inez Catalina M.D.   On: 10/24/2015 14:44   Dg Knee Right Port  10/21/2015  CLINICAL DATA:  RIGHT knee pain after fall EXAM: PORTABLE RIGHT KNEE - 1-2 VIEW COMPARISON:  03/27/2013 FINDINGS: Comminuted fracture of the distal RIGHT femoral metaphysis with medial migration of the distal fracture fragment. There is impaction of the distal femur into  the metaphysis seen on lateral projection. No clear extension to the articular surface. IMPRESSION: Comminuted impaction fracture of the RIGHT femoral metaphysis with angulation. Electronically Signed   By: Suzy Bouchard M.D.   On: 10/21/2015 15:49   Dg C-arm 61-120 Min  10/22/2015  CLINICAL DATA:  Open reduction internal fixation right distal femur fracture EXAM: RIGHT FEMUR 2 VIEWS; DG C-ARM 61-120 MIN COMPARISON:  10/21/2015 FINDINGS: Seven views of the right femur submitted. The patient is state status post intraoperative repair of displaced comminuted fracture of distal right femoral metaphysis. There is a metallic fixation plate and multiple metallic screws are noted in distal femur. There is improvement and near anatomic alignment. Partially visualized distal aspect of the right hip prosthesis. IMPRESSION: Status post intraoperative repair of distal femoral fracture with metallic fixation plate and screws in mid and distal right femur. There is near anatomic alignment with improvement from prior exam. Fluoroscopy time was 1 minutes 58 seconds. Please see the operative report. Electronically Signed   By: Lahoma Crocker M.D.   On: 10/22/2015 13:03   Dg Hip Unilat With Pelvis 2-3 Views Right  10/21/2015  CLINICAL DATA:  Fall today.  Hit pain. EXAM: DG HIP (WITH OR WITHOUT PELVIS) 2-3V RIGHT COMPARISON:  CT 06/02/2015 FINDINGS: Pelvic bony ring is intact. There is a right hip arthroplasty which is located. No evidence for a periprosthetic fracture. Again noted are heterotopic ossifications along the lateral aspect of the left thigh. IMPRESSION: No acute bone abnormality in the pelvis or right hip. Electronically Signed   By: Markus Daft M.D.   On: 10/21/2015 13:57   Dg Femur, Min 2 Views Right  10/22/2015  CLINICAL DATA:  Open reduction internal fixation right distal femur fracture EXAM: RIGHT FEMUR 2 VIEWS; DG C-ARM 61-120 MIN COMPARISON:  10/21/2015 FINDINGS: Seven views of the right femur submitted. The  patient is state status post intraoperative repair of displaced comminuted fracture of distal right femoral metaphysis. There is a metallic fixation plate and multiple metallic screws are noted in distal femur. There is improvement and near anatomic alignment. Partially visualized distal aspect of the right hip prosthesis. IMPRESSION: Status post intraoperative repair of distal femoral fracture with metallic fixation plate and screws in mid and distal right femur. There is near anatomic alignment with improvement from prior exam. Fluoroscopy time was 1 minutes 58 seconds. Please see the operative report. Electronically Signed   By: Lahoma Crocker M.D.   On: 10/22/2015 13:03    Microbiology: Recent Results (from the past 240 hour(s))  Surgical pcr screen     Status: None   Collection Time: 10/21/15  8:23 PM  Result Value Ref Range Status   MRSA, PCR NEGATIVE NEGATIVE Final   Staphylococcus aureus NEGATIVE NEGATIVE Final    Comment:        The Xpert SA Assay (FDA approved for NASAL specimens in patients over 47 years of age), is one component of a comprehensive surveillance program.  Test performance has been validated  by [facility] for patients greater than or equal to 80 year old. It is not intended to diagnose infection nor to guide or monitor treatment.      Labs: Basic Metabolic Panel:  Recent Labs Lab 10/21/15 1445 10/23/15 0426 10/24/15 0524 10/25/15 0400 10/26/15 0341  NA 139 139 141 141 140  K 3.6 3.5 3.4* 3.9 4.7  CL 106 107 108 109 108  CO2 24 25 26 26 26   GLUCOSE 143* 129* 130* 109* 117*  BUN 11 20 24* 22* 23*  CREATININE 0.69 1.02* 0.88 0.74 0.62  CALCIUM 9.3 7.9* 8.0* 7.9* 8.3*   Liver Function Tests:  Recent Labs Lab 10/21/15 1445  AST 23  ALT 14  ALKPHOS 48  BILITOT 0.7  PROT 8.8*  ALBUMIN 3.1*   No results for input(s): LIPASE, AMYLASE in the last 168 hours. No results for input(s): AMMONIA in the last 168 hours. CBC:  Recent Labs Lab  10/21/15 1445 10/23/15 0426 10/24/15 0524 10/25/15 0400 10/26/15 0621  WBC 14.9* 18.3* 14.8* 11.3* 14.6*  NEUTROABS 12.8*  --   --   --   --   HGB 11.8* 7.3* 7.3* 8.0* 9.0*  HCT 36.8 21.8* 22.1* 25.1* 27.0*  MCV 90.4 90.8 88.4 87.5 87.7  PLT 262 201 180 197 226   Cardiac Enzymes: No results for input(s): CKTOTAL, CKMB, CKMBINDEX, TROPONINI in the last 168 hours. BNP: BNP (last 3 results)  Recent Labs  06/02/15 0516  BNP 61.4    ProBNP (last 3 results) No results for input(s): PROBNP in the last 8760 hours.  CBG: No results for input(s): GLUCAP in the last 168 hours.     SignedDomenic Polite MD.  [facility] 10/26/2015, 11:01 AM

## 2015-10-26 NOTE — Clinical Social Work Placement (Signed)
   CLINICAL SOCIAL WORK PLACEMENT  NOTE  Date:  10/26/2015  Patient Details  Name: Sarah Perkins MRN: [id number] Date of Birth: 1917/08/21  Clinical Social Work is seeking post-discharge placement for this patient at the [place] level of care (*CSW will initial, date and re-position this form in  chart as items are completed):  Yes   Patient/family provided with [facility] Work Department's list of facilities offering this level of care within the geographic area requested by the patient (or if unable, by the patient's family).  Yes   Patient/family informed of their freedom to choose among providers that offer the needed level of care, that participate in Medicare, Medicaid or managed care program needed by the patient, have an available bed and are willing to accept the patient.  Yes   Patient/family informed of [facility]'s ownership interest in [facility] and [facility], as well as of the fact that they are under no obligation to receive care at these facilities.  PASRR submitted to EDS on 10/24/15     PASRR number received on       Existing PASRR number confirmed on 10/24/15     FL2 transmitted to all facilities in geographic area requested by pt/family on 10/24/15     FL2 transmitted to all facilities within larger geographic area on       Patient informed that his/her managed care company has contracts with or will negotiate with certain facilities, including the following:        Yes   Patient/family informed of bed offers received.  Patient chooses bed at [facility]     Physician recommends and patient chooses bed at      Patient to be transferred to [facility] on 10/26/15.  Patient to be transferred to facility by [facility]     Patient family notified on 10/26/15 of transfer.  Name of family member notified:  Charles     PHYSICIAN Please prepare prescriptions     Additional Comment:     _______________________________________________ Candie Chroman, LCSW 10/26/2015, 11:55 AM

## 2015-10-26 NOTE — Patient Outreach (Addendum)
Assigned to [employer].

## 2015-10-27 ENCOUNTER — Encounter: Payer: Self-pay | Admitting: *Deleted

## 2015-10-27 ENCOUNTER — Other Ambulatory Visit: Payer: Self-pay

## 2015-10-27 DIAGNOSIS — I480 Paroxysmal atrial fibrillation: Secondary | ICD-10-CM | POA: Diagnosis not present

## 2015-10-27 DIAGNOSIS — S72021A Displaced fracture of epiphysis (separation) (upper) of right femur, initial encounter for closed fracture: Secondary | ICD-10-CM | POA: Diagnosis not present

## 2015-10-27 DIAGNOSIS — D649 Anemia, unspecified: Secondary | ICD-10-CM | POA: Diagnosis not present

## 2015-10-27 DIAGNOSIS — I10 Essential (primary) hypertension: Secondary | ICD-10-CM | POA: Diagnosis not present

## 2015-10-27 DIAGNOSIS — I509 Heart failure, unspecified: Secondary | ICD-10-CM | POA: Diagnosis not present

## 2015-10-27 NOTE — Patient Outreach (Addendum)
[facility]) Care Management  10/27/2015  JAYLINNE SLAPPEY [birth year] [id number]   Second telephone call to patient regarding primary MD referral.  Unable to reach patient.  HIPAA compliant voice message left with call back phone number.  Per chart patient was admitted to the hospital form 10/21/15 to 10/26/15. Per Hospital liaison patient is at a skilled nursing facility post hospital discharge.  Verified with [facility] care management hospital liaison, Natividad Brood that patient will be referred to community case manager.   PLAN: No further follow up needed from this RNCM.  Will close patient. RNCM will refer patient to [facility] to close to telephonic case manager. RNCM notified Natividad Brood that patient will be closed to telephonic case manager. RNCM will notify patients primary MD of closure   Quinn Plowman RN,BSN,CCM [facility] Telephonic  [phone]

## 2015-11-01 ENCOUNTER — Emergency Department (HOSPITAL_COMMUNITY)
Admission: EM | Admit: 2015-11-01 | Discharge: 2015-11-01 | Disposition: A | Discharge status: Home / Self Care | Payer: Medicare Other | Attending: Emergency Medicine | Admitting: Emergency Medicine

## 2015-11-01 ENCOUNTER — Encounter (HOSPITAL_COMMUNITY): Payer: Self-pay

## 2015-11-01 DIAGNOSIS — D500 Iron deficiency anemia secondary to blood loss (chronic): Secondary | ICD-10-CM | POA: Diagnosis not present

## 2015-11-01 DIAGNOSIS — I110 Hypertensive heart disease with heart failure: Secondary | ICD-10-CM | POA: Diagnosis not present

## 2015-11-01 DIAGNOSIS — Z79899 Other long term (current) drug therapy: Secondary | ICD-10-CM | POA: Insufficient documentation

## 2015-11-01 DIAGNOSIS — I509 Heart failure, unspecified: Secondary | ICD-10-CM | POA: Diagnosis not present

## 2015-11-01 LAB — CBC WITH DIFFERENTIAL/PLATELET
BASOS ABS: 0.1 10*3/uL (ref 0.0–0.1)
Basophils Relative: 0 %
EOS ABS: 0.2 10*3/uL (ref 0.0–0.7)
EOS PCT: 1 %
HCT: 30.7 % — ABNORMAL LOW (ref 36.0–46.0)
HEMOGLOBIN: 9.8 g/dL — AB (ref 12.0–15.0)
LYMPHS PCT: 9 %
Lymphs Abs: 1.2 10*3/uL (ref 0.7–4.0)
MCH: 28.8 pg (ref 26.0–34.0)
MCHC: 31.9 g/dL (ref 30.0–36.0)
MCV: 90.3 fL (ref 78.0–100.0)
Monocytes Absolute: 1.8 10*3/uL — ABNORMAL HIGH (ref 0.1–1.0)
Monocytes Relative: 13 %
NEUTROS PCT: 77 %
Neutro Abs: 10.6 10*3/uL — ABNORMAL HIGH (ref 1.7–7.7)
PLATELETS: 599 10*3/uL — AB (ref 150–400)
RBC: 3.4 MIL/uL — AB (ref 3.87–5.11)
RDW: 16.1 % — ABNORMAL HIGH (ref 11.5–15.5)
WBC: 13.8 10*3/uL — AB (ref 4.0–10.5)

## 2015-11-01 LAB — COMPREHENSIVE METABOLIC PANEL
ALK PHOS: 63 U/L (ref 38–126)
ALT: 8 U/L — AB (ref 14–54)
AST: 22 U/L (ref 15–41)
Albumin: 2.6 g/dL — ABNORMAL LOW (ref 3.5–5.0)
Anion gap: 6 (ref 5–15)
BUN: 19 mg/dL (ref 6–20)
CHLORIDE: 104 mmol/L (ref 101–111)
CO2: 28 mmol/L (ref 22–32)
CREATININE: 0.69 mg/dL (ref 0.44–1.00)
Calcium: 8.6 mg/dL — ABNORMAL LOW (ref 8.9–10.3)
GFR calc Af Amer: >60 mL/min (ref >60)
GFR calc non Af Amer: >60 mL/min (ref >60)
GLUCOSE: 118 mg/dL — AB (ref 65–99)
Potassium: 4.4 mmol/L (ref 3.5–5.1)
SODIUM: 138 mmol/L (ref 135–145)
Total Bilirubin: 0.9 mg/dL (ref 0.3–1.2)
Total Protein: 8.3 g/dL — ABNORMAL HIGH (ref 6.5–8.1)

## 2015-11-01 LAB — TYPE AND SCREEN
ABO/RH(D): B POS
Antibody Screen: NEGATIVE

## 2015-11-01 NOTE — ED Provider Notes (Signed)
CSN: [id number]     Arrival date & time 11/01/15  1504 History   First MD Initiated Contact with Patient 11/01/15 1512     Chief Complaint  Patient presents with  . Abnormal Lab     (Consider location/radiation/quality/duration/timing/severity/associated sxs/prior Treatment) Patient is a 82 y.o. female presenting with weakness. The history is provided by the nursing home. No language interpreter was used.  Weakness This is a recurrent problem. The current episode started in the past 7 days. The problem occurs constantly. The problem has been unchanged. Associated symptoms include weakness. She has tried nothing for the symptoms. The treatment provided mild relief.  Pt had ORIF on 6/26.  Pt complains denies any current complaints.  Labs were done on pt and hemoglobin was 7.2.  Pt was sent in for possible transfusion.   Past Medical History  Diagnosis Date  . CHF (congestive heart failure) ([place])   . Hypertension   . Aneurysm ([place]) 1972    cerebral  . Acid reflux     occasional  . Tremor     worse on right arm  . History of GI diverticular bleed 06/2009    colonoscopy with endo clipping of bleeding tic by Dr Clarene Essex.  Flex sig by Dr Paulita Fujita.  . Acute blood loss anemia 06/2009    received ~ 6 units blood  . Adenomatous polyp of colon 06/2009  . Diverticulitis   . UTI (lower urinary tract infection)   . GI bleed 02/14/2014   Past Surgical History  Procedure Laterality Date  . Hip fracture surgery  2011  . Colonoscopy w/ control of hemorrhage  06/2009  . Knee arthroscopy    . Cerebral aneurysm repair  1970's  . Colonoscopy  09/22/2011    Procedure: COLONOSCOPY;  Surgeon: Juanita Craver, MD;  Location: [facility] ENDOSCOPY;  Service: Endoscopy;  Laterality: N/A;  wants ped scope  . Orif femur fracture Right 10/22/2015    Procedure: OPEN REDUCTION INTERNAL FIXATION (ORIF) DISTAL FEMUR FRACTURE;  Surgeon: Mcarthur Rossetti, MD;  Location: [facility];  Service: Orthopedics;  Laterality: Right;    Family History  Problem Relation Age of Onset  . Stroke Mother   . Lung disease Father   . Stroke Brother    Social History  Substance Use Topics  . Smoking status: Never Smoker   . Smokeless tobacco: Never Used  . Alcohol Use: No   OB History    No data available     Review of Systems  Neurological: Positive for weakness.  All other systems reviewed and are negative.     Allergies  Review of patient's allergies indicates no known allergies.  Home Medications   Prior to Admission medications   Medication Sig Start Date End Date Taking? Authorizing Provider  acetaminophen (TYLENOL) 325 MG tablet Take 2 tablets (650 mg total) by mouth every 6 (six) hours as needed for mild pain or moderate pain (or Fever >/= 101). 10/26/15   Domenic Polite, MD  amLODipine (NORVASC) 5 MG tablet Take 5 mg by mouth daily.    Historical Provider, MD  Calcium-Magnesium-Vitamin D 185-50-100 MG-MG-UNIT CAPS Take 1 capsule by mouth daily.    Historical Provider, MD  cyanocobalamin 100 MCG tablet Take 100 mcg by mouth daily.    Historical Provider, MD  docusate sodium (COLACE) 100 MG capsule Take 100 mg by mouth at bedtime.    Historical Provider, MD  enoxaparin (LOVENOX) 30 MG/0.3ML injection Inject 0.3 mLs (30 mg total) into the skin daily.  For 2 weeks for DVT prophylaxis 10/26/15   Domenic Polite, MD  ferrous sulfate 325 (65 FE) MG tablet Take 325 mg by mouth daily with breakfast.    Historical Provider, MD  furosemide (LASIX) 20 MG tablet Take 0.5 tablets (10 mg total) by mouth daily. 10/26/15   Domenic Polite, MD  HYDROcodone-acetaminophen (NORCO/VICODIN) 5-325 MG tablet Take 1 tablet by mouth every 6 (six) hours as needed for severe pain (for severe pain only). 10/26/15   Domenic Polite, MD  pantoprazole (PROTONIX) 40 MG tablet Take 1 tablet (40 mg total) by mouth daily. 06/08/15   Florencia Reasons, MD  polyethylene glycol (MIRALAX / GLYCOLAX) packet Take 17 g by mouth every morning.    Historical Provider,  MD  ranitidine (ZANTAC) 150 MG tablet Take 150 mg by mouth 2 (two) times daily as needed for heartburn.    Historical Provider, MD  vitamin C (ASCORBIC ACID) 500 MG tablet Take 500 mg by mouth daily.    Historical Provider, MD   BP 134/52 mmHg  Pulse 58  Temp(Src) 97.8 F (36.6 C) (Oral)  Resp 20  SpO2 94% Physical Exam  Constitutional: She is oriented to person, place, and time. She appears well-developed and well-nourished.  HENT:  Head: Normocephalic.  Eyes: EOM are normal.  Neck: Normal range of motion.  Cardiovascular: Normal rate.   Pulmonary/Chest: Effort normal.  Abdominal: She exhibits no distension.  Musculoskeletal: Normal range of motion.  Neurological: She is alert and oriented to person, place, and time.  Psychiatric: She has a normal mood and affect.  Nursing note and vitals reviewed.   ED Course  Procedures (including critical care time) Labs Review Labs Reviewed - No data to display  Imaging Review No results found. I have personally reviewed and evaluated these images and lab results as part of my medical decision-making.   EKG Interpretation None      MDM Pt has had improved hemoglobin from 9.0 6 days ago to 9.8 today.  Pt has normal vital signs.     Final diagnoses:  Iron deficiency anemia due to chronic blood loss    An After Visit Summary was printed and given to the patient.    Hollace Kinnier Coin, PA-C 11/01/15 1743  Leonard Schwartz, MD 11/01/15 [phone]

## 2015-11-01 NOTE — ED Notes (Signed)
BIB EMS from [facility], staff their states pts Hemoglobin is 7.3 and is sent her for further evaluation. Pt was seen at [facility] for Right Distal Femoral Fracture. Pt arrives Alert and oriented to self.

## 2015-11-01 NOTE — Discharge Instructions (Signed)
Anemia, Nonspecific Anemia is a condition in which the concentration of red blood cells or hemoglobin in the blood is below normal. Hemoglobin is a substance in red blood cells that carries oxygen to the tissues of the body. Anemia results in not enough oxygen reaching these tissues.  CAUSES  Common causes of anemia include:   Excessive bleeding. Bleeding may be internal or external. This includes excessive bleeding from periods (in women) or from the intestine.   Poor nutrition.   Chronic kidney, thyroid, and liver disease.  Bone marrow disorders that decrease red blood cell production.  Cancer and treatments for cancer.  HIV, AIDS, and their treatments.  Spleen problems that increase red blood cell destruction.  Blood disorders.  Excess destruction of red blood cells due to infection, medicines, and autoimmune disorders. SIGNS AND SYMPTOMS   Minor weakness.   Dizziness.   Headache.  Palpitations.   Shortness of breath, especially with exercise.   Paleness.  Cold sensitivity.  Indigestion.  Nausea.  Difficulty sleeping.  Difficulty concentrating. Symptoms may occur suddenly or they may develop slowly.  DIAGNOSIS  Additional blood tests are often needed. These help your health care provider determine the best treatment. Your health care provider will check your stool for blood and look for other causes of blood loss.  TREATMENT  Treatment varies depending on the cause of the anemia. Treatment can include:   Supplements of iron, vitamin B12, or folic acid.   Hormone medicines.   A blood transfusion. This may be needed if blood loss is severe.   Hospitalization. This may be needed if there is significant continual blood loss.   Dietary changes.  Spleen removal. HOME CARE INSTRUCTIONS Keep all follow-up appointments. It often takes many weeks to correct anemia, and having your health care provider check on your condition and your response to  treatment is very important. SEEK IMMEDIATE MEDICAL CARE IF:   You develop extreme weakness, shortness of breath, or chest pain.   You become dizzy or have trouble concentrating.  You develop heavy vaginal bleeding.   You develop a rash.   You have bloody or black, tarry stools.   You faint.   You vomit up blood.   You vomit repeatedly.   You have abdominal pain.  You have a fever or persistent symptoms for more than 2-3 days.   You have a fever and your symptoms suddenly get worse.   You are dehydrated.  MAKE SURE YOU:  Understand these instructions.  Will watch your condition.  Will get help right away if you are not doing well or get worse.   This information is not intended to replace advice given to you by your health care provider. Make sure you discuss any questions you have with your health care provider.   Document Released: 05/23/2004 Document Revised: 12/16/2012 Document Reviewed: 10/09/2012 Elsevier Interactive Patient Education 2016 Elsevier Inc.  

## 2015-11-02 ENCOUNTER — Other Ambulatory Visit: Payer: Self-pay | Admitting: *Deleted

## 2015-11-02 ENCOUNTER — Encounter: Payer: Self-pay | Admitting: *Deleted

## 2015-11-02 NOTE — Patient Outreach (Signed)
[facility]) Care Management  11/02/2015  Sarah Perkins Jan 12, 1918 [id number]   CSW was able to make contact with patient today to perform the initial assessment, as well as assess and assist with social work needs and services, when [place] met with patient at [facility] [place] where patient currently resides to receive short-term rehabilitative services.  CSW introduced self, explained role and types of services provided through [place] [facility] ([place] [facility]).  CSW further explained to patient that CSW works with patient's RNCM, also with [facility], Natividad Brood. CSW then explained the reason for the call, indicating that [name] would benefit from social work services and resources to assist with possible discharge planning needs and services from the skilled nursing facility.  CSW obtained two HIPAA compliant identifiers from patient, which included patient's name and date of birth. Patient encouraged CSW to converse with her son, [name], with whom patient resides, as well as patient's primary caregiver, and patient indicated that her son handles all her affairs.  Patient was a poor historian due to her diagnosis of Dementia.  According to the social worker at [facility], Mr. Driggs is contemplating long-term care arrangements for patient, as opposed to having patient return home to live, as a result of patient's progressive disease.  CSW was unable to successfully complete a plan of care with patient, needing to obtain appropriate information from Mr. Dickard.  CSW left a HIPAA compliant message for Mr. Tourigny and is currently awaiting a return call.  CSW will continue to follow patient while residing at [facility] to assist with social work needs and services. [name], BSW, MSW, LCSW  Licensed Brewing technologist [facility]  Mailing [place] [address], [place], [place] [address] Physical Address-[address], [place], [place] [address] Toll Free Main # [phone] Fax # [phone] Cell # [phone]  Fax # [phone]  [email or web address]

## 2015-11-13 DIAGNOSIS — D649 Anemia, unspecified: Secondary | ICD-10-CM | POA: Diagnosis not present

## 2015-11-13 DIAGNOSIS — J209 Acute bronchitis, unspecified: Secondary | ICD-10-CM | POA: Diagnosis not present

## 2015-11-13 DIAGNOSIS — S72001D Fracture of unspecified part of neck of right femur, subsequent encounter for closed fracture with routine healing: Secondary | ICD-10-CM | POA: Diagnosis not present

## 2015-11-13 DIAGNOSIS — G301 Alzheimer's disease with late onset: Secondary | ICD-10-CM | POA: Diagnosis not present

## 2015-11-13 DIAGNOSIS — I509 Heart failure, unspecified: Secondary | ICD-10-CM | POA: Diagnosis not present

## 2015-11-13 DIAGNOSIS — I480 Paroxysmal atrial fibrillation: Secondary | ICD-10-CM | POA: Diagnosis not present

## 2015-11-13 DIAGNOSIS — I10 Essential (primary) hypertension: Secondary | ICD-10-CM | POA: Diagnosis not present

## 2015-11-15 ENCOUNTER — Other Ambulatory Visit: Payer: Self-pay | Admitting: *Deleted

## 2015-11-15 NOTE — Patient Outreach (Signed)
[facility]) Care Management  11/15/2015  Sarah Perkins 1917/12/20 [id number]  CSW met with patient briefly today at [facility] [place] where patient currently resides to receive short-term rehabilitative services, to perform a routine visit.  Patient was resting at the time of CSW's arrival and did appear to want to be aroused.  Patient was groggy and not making a lot of sense so CSW had hoped to be able to make contact with the Licensed Clinical Social Worker/Admissions Coordinator at the facility to discuss patient's plan of care, as well as discharge planning arrangements. No one was available at the time of CSW's arrival so a HIPAA compliant message was left on voicemail.  CSW continues to try and make contact with patient's son, Sarah Perkins to discuss possible discharge planning needs and services for patient, without success.  A HIPAA complaint message was also left for [name] on voicemail and CSW awaits a return call.  CSW will follow-up with patient in one week to check patient's discharge planning status. [name], BSW, MSW, LCSW  Licensed Education officer, environmental Health System  Mailing [place] [address] [place], [place] [address] Physical Address-[address], [place], [place] [address] Toll Free Main # [phone] Fax # [phone] Cell # [phone]  Fax # [phone]  [email or web address]

## 2015-11-19 DIAGNOSIS — I5033 Acute on chronic diastolic (congestive) heart failure: Secondary | ICD-10-CM | POA: Diagnosis not present

## 2015-11-19 DIAGNOSIS — S301XXA Contusion of abdominal wall, initial encounter: Secondary | ICD-10-CM | POA: Diagnosis not present

## 2015-11-22 ENCOUNTER — Encounter: Payer: Self-pay | Admitting: *Deleted

## 2015-11-22 ENCOUNTER — Other Ambulatory Visit: Payer: Self-pay | Admitting: *Deleted

## 2015-11-22 NOTE — Patient Outreach (Signed)
[facility]) Care Management  11/22/2015  Sarah Perkins 06/17/1917 [id number]   CSW was able to meet with patient today at [facility] [place] where patient currently resides to receive short-term rehabilitative services, to perform a routine visit.  Patient was resting peacefully at the time of CSW's arrival so CSW made the visit brief.  Patient did not remember having met with CSW in the past, despite several previous visits.  Patient was able to converse with patient's son, Breigh Annett, as he was residing at patient's bedside at the time of CSW's arrival.  CSW was able to complete patient's Care Plan with Mr. Bannister, who reports that patient is not currently in need of social work services through [place] with [place] [facility].  According to Mr. Dara, patient will be placed into a long-term care facility upon discharge from [facility].  Mr. Gadsby went on to say that he is currently working with the Discharge Planning Coordinator at [facility] to explore placement options for patient, in the event that they are unable to afford for patient to remain at [facility].  Mr. Malachowski indicated that patient is still working with therapies (both physical and occupational), but that it is a slow process due to patient's progressing Dementia.  CSW provided Mr. Shetley with CSW's contact information, encouraging him to contact CSW if any additional assistance is needed in the future. CSW will perform a case closure on patient, as all goals of treatment have been met from social work standpoint and no additional social work needs have been identified at this time.  CSW will fax an update to patient's Primary Care Physician, Dr.  Thressa Sheller to ensure that they are aware of CSW's involvement with patient's plan of care.  CSW will submit a case closure request to Verlon Setting, Care Management Assistant with  [facility], in the form of an [employer].   [name], BSW, MSW, LCSW  Licensed Education officer, environmental Health System  Mailing [place] [address] [place], [place] [address] Physical Address-[address], [place], [place] [address] Toll Free Main # [phone] Fax # [phone] Cell # [phone]  Fax # [phone]  [email or web address]

## 2015-11-24 ENCOUNTER — Ambulatory Visit: Payer: Self-pay | Admitting: *Deleted

## 2015-12-05 ENCOUNTER — Encounter (HOSPITAL_COMMUNITY): Payer: Self-pay | Admitting: Orthopaedic Surgery

## 2015-12-06 DIAGNOSIS — S72451D Displaced supracondylar fracture without intracondylar extension of lower end of right femur, subsequent encounter for closed fracture with routine healing: Secondary | ICD-10-CM | POA: Diagnosis not present

## 2015-12-12 DIAGNOSIS — S72001D Fracture of unspecified part of neck of right femur, subsequent encounter for closed fracture with routine healing: Secondary | ICD-10-CM | POA: Diagnosis not present

## 2015-12-12 DIAGNOSIS — I10 Essential (primary) hypertension: Secondary | ICD-10-CM | POA: Diagnosis not present

## 2015-12-12 DIAGNOSIS — I509 Heart failure, unspecified: Secondary | ICD-10-CM | POA: Diagnosis not present

## 2015-12-12 DIAGNOSIS — J189 Pneumonia, unspecified organism: Secondary | ICD-10-CM | POA: Diagnosis not present

## 2015-12-12 DIAGNOSIS — D649 Anemia, unspecified: Secondary | ICD-10-CM | POA: Diagnosis not present

## 2016-01-03 DIAGNOSIS — S72451D Displaced supracondylar fracture without intracondylar extension of lower end of right femur, subsequent encounter for closed fracture with routine healing: Secondary | ICD-10-CM | POA: Diagnosis not present

## 2016-01-04 DIAGNOSIS — M06851 Other specified rheumatoid arthritis, right hip: Secondary | ICD-10-CM | POA: Diagnosis not present

## 2016-01-04 DIAGNOSIS — I509 Heart failure, unspecified: Secondary | ICD-10-CM | POA: Diagnosis not present

## 2016-01-04 DIAGNOSIS — D649 Anemia, unspecified: Secondary | ICD-10-CM | POA: Diagnosis not present

## 2016-01-04 DIAGNOSIS — N183 Chronic kidney disease, stage 3 (moderate): Secondary | ICD-10-CM | POA: Diagnosis not present

## 2016-01-04 DIAGNOSIS — R55 Syncope and collapse: Secondary | ICD-10-CM | POA: Diagnosis not present

## 2016-01-04 DIAGNOSIS — Z9181 History of falling: Secondary | ICD-10-CM | POA: Diagnosis not present

## 2016-01-04 DIAGNOSIS — Z4789 Encounter for other orthopedic aftercare: Secondary | ICD-10-CM | POA: Diagnosis not present

## 2016-01-04 DIAGNOSIS — D72829 Elevated white blood cell count, unspecified: Secondary | ICD-10-CM | POA: Diagnosis not present

## 2016-01-04 DIAGNOSIS — S72401D Unspecified fracture of lower end of right femur, subsequent encounter for closed fracture with routine healing: Secondary | ICD-10-CM | POA: Diagnosis not present

## 2016-01-04 DIAGNOSIS — R1312 Dysphagia, oropharyngeal phase: Secondary | ICD-10-CM | POA: Diagnosis not present

## 2016-01-04 DIAGNOSIS — I10 Essential (primary) hypertension: Secondary | ICD-10-CM | POA: Diagnosis not present

## 2016-01-04 DIAGNOSIS — F0390 Unspecified dementia without behavioral disturbance: Secondary | ICD-10-CM | POA: Diagnosis not present

## 2016-01-04 DIAGNOSIS — R278 Other lack of coordination: Secondary | ICD-10-CM | POA: Diagnosis not present

## 2016-01-04 DIAGNOSIS — R262 Difficulty in walking, not elsewhere classified: Secondary | ICD-10-CM | POA: Diagnosis not present

## 2016-01-04 DIAGNOSIS — M6281 Muscle weakness (generalized): Secondary | ICD-10-CM | POA: Diagnosis not present

## 2016-01-04 DIAGNOSIS — R2689 Other abnormalities of gait and mobility: Secondary | ICD-10-CM | POA: Diagnosis not present

## 2016-01-04 DIAGNOSIS — R2681 Unsteadiness on feet: Secondary | ICD-10-CM | POA: Diagnosis not present

## 2016-01-04 DIAGNOSIS — R05 Cough: Secondary | ICD-10-CM | POA: Diagnosis not present

## 2016-01-04 DIAGNOSIS — K219 Gastro-esophageal reflux disease without esophagitis: Secondary | ICD-10-CM | POA: Diagnosis not present

## 2016-01-04 DIAGNOSIS — S7291XS Unspecified fracture of right femur, sequela: Secondary | ICD-10-CM | POA: Diagnosis not present

## 2016-01-11 DIAGNOSIS — D649 Anemia, unspecified: Secondary | ICD-10-CM | POA: Diagnosis not present

## 2016-01-11 DIAGNOSIS — I509 Heart failure, unspecified: Secondary | ICD-10-CM | POA: Diagnosis not present

## 2016-01-11 DIAGNOSIS — I10 Essential (primary) hypertension: Secondary | ICD-10-CM | POA: Diagnosis not present

## 2016-01-26 DIAGNOSIS — I509 Heart failure, unspecified: Secondary | ICD-10-CM | POA: Diagnosis not present

## 2016-01-31 ENCOUNTER — Ambulatory Visit (INDEPENDENT_AMBULATORY_CARE_PROVIDER_SITE_OTHER): Payer: Medicare Other | Admitting: Orthopaedic Surgery

## 2016-01-31 DIAGNOSIS — S72451D Displaced supracondylar fracture without intracondylar extension of lower end of right femur, subsequent encounter for closed fracture with routine healing: Secondary | ICD-10-CM | POA: Diagnosis not present

## 2016-02-01 DIAGNOSIS — R2689 Other abnormalities of gait and mobility: Secondary | ICD-10-CM | POA: Diagnosis not present

## 2016-02-01 DIAGNOSIS — D72829 Elevated white blood cell count, unspecified: Secondary | ICD-10-CM | POA: Diagnosis not present

## 2016-02-01 DIAGNOSIS — I5020 Unspecified systolic (congestive) heart failure: Secondary | ICD-10-CM | POA: Diagnosis not present

## 2016-02-01 DIAGNOSIS — D519 Vitamin B12 deficiency anemia, unspecified: Secondary | ICD-10-CM | POA: Diagnosis not present

## 2016-02-01 DIAGNOSIS — M6281 Muscle weakness (generalized): Secondary | ICD-10-CM | POA: Diagnosis not present

## 2016-02-01 DIAGNOSIS — F0390 Unspecified dementia without behavioral disturbance: Secondary | ICD-10-CM | POA: Diagnosis not present

## 2016-02-01 DIAGNOSIS — I509 Heart failure, unspecified: Secondary | ICD-10-CM | POA: Diagnosis not present

## 2016-02-01 DIAGNOSIS — I10 Essential (primary) hypertension: Secondary | ICD-10-CM | POA: Diagnosis not present

## 2016-02-01 DIAGNOSIS — R05 Cough: Secondary | ICD-10-CM | POA: Diagnosis not present

## 2016-02-01 DIAGNOSIS — N183 Chronic kidney disease, stage 3 (moderate): Secondary | ICD-10-CM | POA: Diagnosis not present

## 2016-02-01 DIAGNOSIS — K219 Gastro-esophageal reflux disease without esophagitis: Secondary | ICD-10-CM | POA: Diagnosis not present

## 2016-02-01 DIAGNOSIS — R262 Difficulty in walking, not elsewhere classified: Secondary | ICD-10-CM | POA: Diagnosis not present

## 2016-02-01 DIAGNOSIS — R319 Hematuria, unspecified: Secondary | ICD-10-CM | POA: Diagnosis not present

## 2016-02-01 DIAGNOSIS — S72401D Unspecified fracture of lower end of right femur, subsequent encounter for closed fracture with routine healing: Secondary | ICD-10-CM | POA: Diagnosis not present

## 2016-02-01 DIAGNOSIS — S7291XS Unspecified fracture of right femur, sequela: Secondary | ICD-10-CM | POA: Diagnosis not present

## 2016-02-01 DIAGNOSIS — Z4789 Encounter for other orthopedic aftercare: Secondary | ICD-10-CM | POA: Diagnosis not present

## 2016-02-01 DIAGNOSIS — R278 Other lack of coordination: Secondary | ICD-10-CM | POA: Diagnosis not present

## 2016-02-01 DIAGNOSIS — N390 Urinary tract infection, site not specified: Secondary | ICD-10-CM | POA: Diagnosis not present

## 2016-02-01 DIAGNOSIS — Z9181 History of falling: Secondary | ICD-10-CM | POA: Diagnosis not present

## 2016-02-01 DIAGNOSIS — M06851 Other specified rheumatoid arthritis, right hip: Secondary | ICD-10-CM | POA: Diagnosis not present

## 2016-02-01 DIAGNOSIS — R2681 Unsteadiness on feet: Secondary | ICD-10-CM | POA: Diagnosis not present

## 2016-02-01 DIAGNOSIS — D649 Anemia, unspecified: Secondary | ICD-10-CM | POA: Diagnosis not present

## 2016-02-01 DIAGNOSIS — R55 Syncope and collapse: Secondary | ICD-10-CM | POA: Diagnosis not present

## 2016-02-01 DIAGNOSIS — Z79899 Other long term (current) drug therapy: Secondary | ICD-10-CM | POA: Diagnosis not present

## 2016-02-01 DIAGNOSIS — R1312 Dysphagia, oropharyngeal phase: Secondary | ICD-10-CM | POA: Diagnosis not present

## 2016-02-12 DIAGNOSIS — I10 Essential (primary) hypertension: Secondary | ICD-10-CM | POA: Diagnosis not present

## 2016-02-12 DIAGNOSIS — I509 Heart failure, unspecified: Secondary | ICD-10-CM | POA: Diagnosis not present

## 2016-02-12 DIAGNOSIS — D649 Anemia, unspecified: Secondary | ICD-10-CM | POA: Diagnosis not present

## 2016-02-24 DIAGNOSIS — I5032 Chronic diastolic (congestive) heart failure: Secondary | ICD-10-CM | POA: Diagnosis not present

## 2016-02-24 DIAGNOSIS — S72401D Unspecified fracture of lower end of right femur, subsequent encounter for closed fracture with routine healing: Secondary | ICD-10-CM | POA: Diagnosis not present

## 2016-02-24 DIAGNOSIS — L89893 Pressure ulcer of other site, stage 3: Secondary | ICD-10-CM | POA: Diagnosis not present

## 2016-02-24 DIAGNOSIS — L988 Other specified disorders of the skin and subcutaneous tissue: Secondary | ICD-10-CM | POA: Diagnosis not present

## 2016-02-24 DIAGNOSIS — N182 Chronic kidney disease, stage 2 (mild): Secondary | ICD-10-CM | POA: Diagnosis not present

## 2016-02-24 DIAGNOSIS — I130 Hypertensive heart and chronic kidney disease with heart failure and stage 1 through stage 4 chronic kidney disease, or unspecified chronic kidney disease: Secondary | ICD-10-CM | POA: Diagnosis not present

## 2016-02-27 DIAGNOSIS — I130 Hypertensive heart and chronic kidney disease with heart failure and stage 1 through stage 4 chronic kidney disease, or unspecified chronic kidney disease: Secondary | ICD-10-CM | POA: Diagnosis not present

## 2016-02-27 DIAGNOSIS — L89893 Pressure ulcer of other site, stage 3: Secondary | ICD-10-CM | POA: Diagnosis not present

## 2016-02-27 DIAGNOSIS — S72401D Unspecified fracture of lower end of right femur, subsequent encounter for closed fracture with routine healing: Secondary | ICD-10-CM | POA: Diagnosis not present

## 2016-02-27 DIAGNOSIS — L988 Other specified disorders of the skin and subcutaneous tissue: Secondary | ICD-10-CM | POA: Diagnosis not present

## 2016-02-27 DIAGNOSIS — N182 Chronic kidney disease, stage 2 (mild): Secondary | ICD-10-CM | POA: Diagnosis not present

## 2016-02-27 DIAGNOSIS — I5032 Chronic diastolic (congestive) heart failure: Secondary | ICD-10-CM | POA: Diagnosis not present

## 2016-02-28 DIAGNOSIS — S72401D Unspecified fracture of lower end of right femur, subsequent encounter for closed fracture with routine healing: Secondary | ICD-10-CM | POA: Diagnosis not present

## 2016-02-28 DIAGNOSIS — I130 Hypertensive heart and chronic kidney disease with heart failure and stage 1 through stage 4 chronic kidney disease, or unspecified chronic kidney disease: Secondary | ICD-10-CM | POA: Diagnosis not present

## 2016-02-28 DIAGNOSIS — L988 Other specified disorders of the skin and subcutaneous tissue: Secondary | ICD-10-CM | POA: Diagnosis not present

## 2016-02-28 DIAGNOSIS — I5032 Chronic diastolic (congestive) heart failure: Secondary | ICD-10-CM | POA: Diagnosis not present

## 2016-02-28 DIAGNOSIS — N182 Chronic kidney disease, stage 2 (mild): Secondary | ICD-10-CM | POA: Diagnosis not present

## 2016-02-28 DIAGNOSIS — L89893 Pressure ulcer of other site, stage 3: Secondary | ICD-10-CM | POA: Diagnosis not present

## 2016-02-29 DIAGNOSIS — S72401D Unspecified fracture of lower end of right femur, subsequent encounter for closed fracture with routine healing: Secondary | ICD-10-CM | POA: Diagnosis not present

## 2016-02-29 DIAGNOSIS — L89893 Pressure ulcer of other site, stage 3: Secondary | ICD-10-CM | POA: Diagnosis not present

## 2016-02-29 DIAGNOSIS — L988 Other specified disorders of the skin and subcutaneous tissue: Secondary | ICD-10-CM | POA: Diagnosis not present

## 2016-02-29 DIAGNOSIS — N182 Chronic kidney disease, stage 2 (mild): Secondary | ICD-10-CM | POA: Diagnosis not present

## 2016-02-29 DIAGNOSIS — I130 Hypertensive heart and chronic kidney disease with heart failure and stage 1 through stage 4 chronic kidney disease, or unspecified chronic kidney disease: Secondary | ICD-10-CM | POA: Diagnosis not present

## 2016-02-29 DIAGNOSIS — I5032 Chronic diastolic (congestive) heart failure: Secondary | ICD-10-CM | POA: Diagnosis not present

## 2016-03-01 DIAGNOSIS — N182 Chronic kidney disease, stage 2 (mild): Secondary | ICD-10-CM | POA: Diagnosis not present

## 2016-03-01 DIAGNOSIS — L89893 Pressure ulcer of other site, stage 3: Secondary | ICD-10-CM | POA: Diagnosis not present

## 2016-03-01 DIAGNOSIS — L988 Other specified disorders of the skin and subcutaneous tissue: Secondary | ICD-10-CM | POA: Diagnosis not present

## 2016-03-01 DIAGNOSIS — I130 Hypertensive heart and chronic kidney disease with heart failure and stage 1 through stage 4 chronic kidney disease, or unspecified chronic kidney disease: Secondary | ICD-10-CM | POA: Diagnosis not present

## 2016-03-01 DIAGNOSIS — S72401D Unspecified fracture of lower end of right femur, subsequent encounter for closed fracture with routine healing: Secondary | ICD-10-CM | POA: Diagnosis not present

## 2016-03-01 DIAGNOSIS — I5032 Chronic diastolic (congestive) heart failure: Secondary | ICD-10-CM | POA: Diagnosis not present

## 2016-03-04 DIAGNOSIS — L89893 Pressure ulcer of other site, stage 3: Secondary | ICD-10-CM | POA: Diagnosis not present

## 2016-03-04 DIAGNOSIS — L988 Other specified disorders of the skin and subcutaneous tissue: Secondary | ICD-10-CM | POA: Diagnosis not present

## 2016-03-04 DIAGNOSIS — I5032 Chronic diastolic (congestive) heart failure: Secondary | ICD-10-CM | POA: Diagnosis not present

## 2016-03-04 DIAGNOSIS — I130 Hypertensive heart and chronic kidney disease with heart failure and stage 1 through stage 4 chronic kidney disease, or unspecified chronic kidney disease: Secondary | ICD-10-CM | POA: Diagnosis not present

## 2016-03-04 DIAGNOSIS — S72401D Unspecified fracture of lower end of right femur, subsequent encounter for closed fracture with routine healing: Secondary | ICD-10-CM | POA: Diagnosis not present

## 2016-03-04 DIAGNOSIS — N182 Chronic kidney disease, stage 2 (mild): Secondary | ICD-10-CM | POA: Diagnosis not present

## 2016-03-05 DIAGNOSIS — N182 Chronic kidney disease, stage 2 (mild): Secondary | ICD-10-CM | POA: Diagnosis not present

## 2016-03-05 DIAGNOSIS — S72401D Unspecified fracture of lower end of right femur, subsequent encounter for closed fracture with routine healing: Secondary | ICD-10-CM | POA: Diagnosis not present

## 2016-03-05 DIAGNOSIS — I130 Hypertensive heart and chronic kidney disease with heart failure and stage 1 through stage 4 chronic kidney disease, or unspecified chronic kidney disease: Secondary | ICD-10-CM | POA: Diagnosis not present

## 2016-03-05 DIAGNOSIS — L988 Other specified disorders of the skin and subcutaneous tissue: Secondary | ICD-10-CM | POA: Diagnosis not present

## 2016-03-05 DIAGNOSIS — I5032 Chronic diastolic (congestive) heart failure: Secondary | ICD-10-CM | POA: Diagnosis not present

## 2016-03-05 DIAGNOSIS — L89893 Pressure ulcer of other site, stage 3: Secondary | ICD-10-CM | POA: Diagnosis not present

## 2016-03-06 DIAGNOSIS — L89893 Pressure ulcer of other site, stage 3: Secondary | ICD-10-CM | POA: Diagnosis not present

## 2016-03-06 DIAGNOSIS — I5032 Chronic diastolic (congestive) heart failure: Secondary | ICD-10-CM | POA: Diagnosis not present

## 2016-03-06 DIAGNOSIS — I130 Hypertensive heart and chronic kidney disease with heart failure and stage 1 through stage 4 chronic kidney disease, or unspecified chronic kidney disease: Secondary | ICD-10-CM | POA: Diagnosis not present

## 2016-03-06 DIAGNOSIS — S72401D Unspecified fracture of lower end of right femur, subsequent encounter for closed fracture with routine healing: Secondary | ICD-10-CM | POA: Diagnosis not present

## 2016-03-06 DIAGNOSIS — L988 Other specified disorders of the skin and subcutaneous tissue: Secondary | ICD-10-CM | POA: Diagnosis not present

## 2016-03-06 DIAGNOSIS — N182 Chronic kidney disease, stage 2 (mild): Secondary | ICD-10-CM | POA: Diagnosis not present

## 2016-03-07 DIAGNOSIS — I130 Hypertensive heart and chronic kidney disease with heart failure and stage 1 through stage 4 chronic kidney disease, or unspecified chronic kidney disease: Secondary | ICD-10-CM | POA: Diagnosis not present

## 2016-03-07 DIAGNOSIS — N182 Chronic kidney disease, stage 2 (mild): Secondary | ICD-10-CM | POA: Diagnosis not present

## 2016-03-07 DIAGNOSIS — L988 Other specified disorders of the skin and subcutaneous tissue: Secondary | ICD-10-CM | POA: Diagnosis not present

## 2016-03-07 DIAGNOSIS — S72401D Unspecified fracture of lower end of right femur, subsequent encounter for closed fracture with routine healing: Secondary | ICD-10-CM | POA: Diagnosis not present

## 2016-03-07 DIAGNOSIS — L89893 Pressure ulcer of other site, stage 3: Secondary | ICD-10-CM | POA: Diagnosis not present

## 2016-03-07 DIAGNOSIS — I5032 Chronic diastolic (congestive) heart failure: Secondary | ICD-10-CM | POA: Diagnosis not present

## 2016-03-08 DIAGNOSIS — S72401D Unspecified fracture of lower end of right femur, subsequent encounter for closed fracture with routine healing: Secondary | ICD-10-CM | POA: Diagnosis not present

## 2016-03-08 DIAGNOSIS — L988 Other specified disorders of the skin and subcutaneous tissue: Secondary | ICD-10-CM | POA: Diagnosis not present

## 2016-03-08 DIAGNOSIS — I130 Hypertensive heart and chronic kidney disease with heart failure and stage 1 through stage 4 chronic kidney disease, or unspecified chronic kidney disease: Secondary | ICD-10-CM | POA: Diagnosis not present

## 2016-03-08 DIAGNOSIS — L89893 Pressure ulcer of other site, stage 3: Secondary | ICD-10-CM | POA: Diagnosis not present

## 2016-03-08 DIAGNOSIS — N182 Chronic kidney disease, stage 2 (mild): Secondary | ICD-10-CM | POA: Diagnosis not present

## 2016-03-08 DIAGNOSIS — I5032 Chronic diastolic (congestive) heart failure: Secondary | ICD-10-CM | POA: Diagnosis not present

## 2016-03-11 DIAGNOSIS — I130 Hypertensive heart and chronic kidney disease with heart failure and stage 1 through stage 4 chronic kidney disease, or unspecified chronic kidney disease: Secondary | ICD-10-CM | POA: Diagnosis not present

## 2016-03-11 DIAGNOSIS — S72401D Unspecified fracture of lower end of right femur, subsequent encounter for closed fracture with routine healing: Secondary | ICD-10-CM | POA: Diagnosis not present

## 2016-03-11 DIAGNOSIS — N182 Chronic kidney disease, stage 2 (mild): Secondary | ICD-10-CM | POA: Diagnosis not present

## 2016-03-11 DIAGNOSIS — L988 Other specified disorders of the skin and subcutaneous tissue: Secondary | ICD-10-CM | POA: Diagnosis not present

## 2016-03-11 DIAGNOSIS — L89893 Pressure ulcer of other site, stage 3: Secondary | ICD-10-CM | POA: Diagnosis not present

## 2016-03-11 DIAGNOSIS — I5032 Chronic diastolic (congestive) heart failure: Secondary | ICD-10-CM | POA: Diagnosis not present

## 2016-03-12 DIAGNOSIS — N182 Chronic kidney disease, stage 2 (mild): Secondary | ICD-10-CM | POA: Diagnosis not present

## 2016-03-12 DIAGNOSIS — L89893 Pressure ulcer of other site, stage 3: Secondary | ICD-10-CM | POA: Diagnosis not present

## 2016-03-12 DIAGNOSIS — I130 Hypertensive heart and chronic kidney disease with heart failure and stage 1 through stage 4 chronic kidney disease, or unspecified chronic kidney disease: Secondary | ICD-10-CM | POA: Diagnosis not present

## 2016-03-12 DIAGNOSIS — S72401D Unspecified fracture of lower end of right femur, subsequent encounter for closed fracture with routine healing: Secondary | ICD-10-CM | POA: Diagnosis not present

## 2016-03-12 DIAGNOSIS — I5032 Chronic diastolic (congestive) heart failure: Secondary | ICD-10-CM | POA: Diagnosis not present

## 2016-03-12 DIAGNOSIS — L988 Other specified disorders of the skin and subcutaneous tissue: Secondary | ICD-10-CM | POA: Diagnosis not present

## 2016-03-13 DIAGNOSIS — L89893 Pressure ulcer of other site, stage 3: Secondary | ICD-10-CM | POA: Diagnosis not present

## 2016-03-13 DIAGNOSIS — I5032 Chronic diastolic (congestive) heart failure: Secondary | ICD-10-CM | POA: Diagnosis not present

## 2016-03-13 DIAGNOSIS — N182 Chronic kidney disease, stage 2 (mild): Secondary | ICD-10-CM | POA: Diagnosis not present

## 2016-03-13 DIAGNOSIS — L988 Other specified disorders of the skin and subcutaneous tissue: Secondary | ICD-10-CM | POA: Diagnosis not present

## 2016-03-13 DIAGNOSIS — S72401D Unspecified fracture of lower end of right femur, subsequent encounter for closed fracture with routine healing: Secondary | ICD-10-CM | POA: Diagnosis not present

## 2016-03-13 DIAGNOSIS — I130 Hypertensive heart and chronic kidney disease with heart failure and stage 1 through stage 4 chronic kidney disease, or unspecified chronic kidney disease: Secondary | ICD-10-CM | POA: Diagnosis not present

## 2016-03-14 DIAGNOSIS — L988 Other specified disorders of the skin and subcutaneous tissue: Secondary | ICD-10-CM | POA: Diagnosis not present

## 2016-03-14 DIAGNOSIS — I5032 Chronic diastolic (congestive) heart failure: Secondary | ICD-10-CM | POA: Diagnosis not present

## 2016-03-14 DIAGNOSIS — S72401D Unspecified fracture of lower end of right femur, subsequent encounter for closed fracture with routine healing: Secondary | ICD-10-CM | POA: Diagnosis not present

## 2016-03-14 DIAGNOSIS — N182 Chronic kidney disease, stage 2 (mild): Secondary | ICD-10-CM | POA: Diagnosis not present

## 2016-03-14 DIAGNOSIS — L89893 Pressure ulcer of other site, stage 3: Secondary | ICD-10-CM | POA: Diagnosis not present

## 2016-03-14 DIAGNOSIS — I130 Hypertensive heart and chronic kidney disease with heart failure and stage 1 through stage 4 chronic kidney disease, or unspecified chronic kidney disease: Secondary | ICD-10-CM | POA: Diagnosis not present

## 2016-03-18 DIAGNOSIS — I130 Hypertensive heart and chronic kidney disease with heart failure and stage 1 through stage 4 chronic kidney disease, or unspecified chronic kidney disease: Secondary | ICD-10-CM | POA: Diagnosis not present

## 2016-03-18 DIAGNOSIS — S72401D Unspecified fracture of lower end of right femur, subsequent encounter for closed fracture with routine healing: Secondary | ICD-10-CM | POA: Diagnosis not present

## 2016-03-18 DIAGNOSIS — L988 Other specified disorders of the skin and subcutaneous tissue: Secondary | ICD-10-CM | POA: Diagnosis not present

## 2016-03-18 DIAGNOSIS — I10 Essential (primary) hypertension: Secondary | ICD-10-CM | POA: Diagnosis not present

## 2016-03-18 DIAGNOSIS — L989 Disorder of the skin and subcutaneous tissue, unspecified: Secondary | ICD-10-CM | POA: Diagnosis not present

## 2016-03-18 DIAGNOSIS — I5032 Chronic diastolic (congestive) heart failure: Secondary | ICD-10-CM | POA: Diagnosis not present

## 2016-03-18 DIAGNOSIS — L89893 Pressure ulcer of other site, stage 3: Secondary | ICD-10-CM | POA: Diagnosis not present

## 2016-03-18 DIAGNOSIS — N182 Chronic kidney disease, stage 2 (mild): Secondary | ICD-10-CM | POA: Diagnosis not present

## 2016-03-18 DIAGNOSIS — K219 Gastro-esophageal reflux disease without esophagitis: Secondary | ICD-10-CM | POA: Diagnosis not present

## 2016-03-19 DIAGNOSIS — N182 Chronic kidney disease, stage 2 (mild): Secondary | ICD-10-CM | POA: Diagnosis not present

## 2016-03-19 DIAGNOSIS — S72401D Unspecified fracture of lower end of right femur, subsequent encounter for closed fracture with routine healing: Secondary | ICD-10-CM | POA: Diagnosis not present

## 2016-03-19 DIAGNOSIS — I5032 Chronic diastolic (congestive) heart failure: Secondary | ICD-10-CM | POA: Diagnosis not present

## 2016-03-19 DIAGNOSIS — L89893 Pressure ulcer of other site, stage 3: Secondary | ICD-10-CM | POA: Diagnosis not present

## 2016-03-19 DIAGNOSIS — I130 Hypertensive heart and chronic kidney disease with heart failure and stage 1 through stage 4 chronic kidney disease, or unspecified chronic kidney disease: Secondary | ICD-10-CM | POA: Diagnosis not present

## 2016-03-19 DIAGNOSIS — L988 Other specified disorders of the skin and subcutaneous tissue: Secondary | ICD-10-CM | POA: Diagnosis not present

## 2016-03-20 DIAGNOSIS — I5032 Chronic diastolic (congestive) heart failure: Secondary | ICD-10-CM | POA: Diagnosis not present

## 2016-03-20 DIAGNOSIS — N182 Chronic kidney disease, stage 2 (mild): Secondary | ICD-10-CM | POA: Diagnosis not present

## 2016-03-20 DIAGNOSIS — L89893 Pressure ulcer of other site, stage 3: Secondary | ICD-10-CM | POA: Diagnosis not present

## 2016-03-20 DIAGNOSIS — S72401D Unspecified fracture of lower end of right femur, subsequent encounter for closed fracture with routine healing: Secondary | ICD-10-CM | POA: Diagnosis not present

## 2016-03-20 DIAGNOSIS — I130 Hypertensive heart and chronic kidney disease with heart failure and stage 1 through stage 4 chronic kidney disease, or unspecified chronic kidney disease: Secondary | ICD-10-CM | POA: Diagnosis not present

## 2016-03-20 DIAGNOSIS — L988 Other specified disorders of the skin and subcutaneous tissue: Secondary | ICD-10-CM | POA: Diagnosis not present

## 2016-03-22 DIAGNOSIS — S72401D Unspecified fracture of lower end of right femur, subsequent encounter for closed fracture with routine healing: Secondary | ICD-10-CM | POA: Diagnosis not present

## 2016-03-22 DIAGNOSIS — N182 Chronic kidney disease, stage 2 (mild): Secondary | ICD-10-CM | POA: Diagnosis not present

## 2016-03-22 DIAGNOSIS — I5032 Chronic diastolic (congestive) heart failure: Secondary | ICD-10-CM | POA: Diagnosis not present

## 2016-03-22 DIAGNOSIS — L988 Other specified disorders of the skin and subcutaneous tissue: Secondary | ICD-10-CM | POA: Diagnosis not present

## 2016-03-22 DIAGNOSIS — I130 Hypertensive heart and chronic kidney disease with heart failure and stage 1 through stage 4 chronic kidney disease, or unspecified chronic kidney disease: Secondary | ICD-10-CM | POA: Diagnosis not present

## 2016-03-22 DIAGNOSIS — L89893 Pressure ulcer of other site, stage 3: Secondary | ICD-10-CM | POA: Diagnosis not present

## 2016-03-25 DIAGNOSIS — I130 Hypertensive heart and chronic kidney disease with heart failure and stage 1 through stage 4 chronic kidney disease, or unspecified chronic kidney disease: Secondary | ICD-10-CM | POA: Diagnosis not present

## 2016-03-25 DIAGNOSIS — L988 Other specified disorders of the skin and subcutaneous tissue: Secondary | ICD-10-CM | POA: Diagnosis not present

## 2016-03-25 DIAGNOSIS — S72401D Unspecified fracture of lower end of right femur, subsequent encounter for closed fracture with routine healing: Secondary | ICD-10-CM | POA: Diagnosis not present

## 2016-03-25 DIAGNOSIS — I5032 Chronic diastolic (congestive) heart failure: Secondary | ICD-10-CM | POA: Diagnosis not present

## 2016-03-25 DIAGNOSIS — N182 Chronic kidney disease, stage 2 (mild): Secondary | ICD-10-CM | POA: Diagnosis not present

## 2016-03-25 DIAGNOSIS — L89893 Pressure ulcer of other site, stage 3: Secondary | ICD-10-CM | POA: Diagnosis not present

## 2016-03-27 ENCOUNTER — Emergency Department (HOSPITAL_COMMUNITY): Payer: Medicare Other

## 2016-03-27 ENCOUNTER — Observation Stay (HOSPITAL_COMMUNITY): Payer: Medicare Other

## 2016-03-27 ENCOUNTER — Encounter (HOSPITAL_COMMUNITY): Payer: Self-pay

## 2016-03-27 ENCOUNTER — Inpatient Hospital Stay (HOSPITAL_COMMUNITY)
Admission: EM | Admit: 2016-03-27 | Discharge: 2016-03-31 | DRG: 871 | Disposition: A | Discharge status: Home Health | Payer: Medicare Other | Attending: Internal Medicine | Admitting: Internal Medicine

## 2016-03-27 DIAGNOSIS — R0902 Hypoxemia: Secondary | ICD-10-CM

## 2016-03-27 DIAGNOSIS — I248 Other forms of acute ischemic heart disease: Secondary | ICD-10-CM | POA: Diagnosis present

## 2016-03-27 DIAGNOSIS — R069 Unspecified abnormalities of breathing: Secondary | ICD-10-CM | POA: Diagnosis not present

## 2016-03-27 DIAGNOSIS — L988 Other specified disorders of the skin and subcutaneous tissue: Secondary | ICD-10-CM | POA: Diagnosis not present

## 2016-03-27 DIAGNOSIS — Z79891 Long term (current) use of opiate analgesic: Secondary | ICD-10-CM

## 2016-03-27 DIAGNOSIS — A419 Sepsis, unspecified organism: Secondary | ICD-10-CM | POA: Diagnosis not present

## 2016-03-27 DIAGNOSIS — J209 Acute bronchitis, unspecified: Secondary | ICD-10-CM | POA: Diagnosis not present

## 2016-03-27 DIAGNOSIS — R4182 Altered mental status, unspecified: Secondary | ICD-10-CM | POA: Diagnosis present

## 2016-03-27 DIAGNOSIS — E876 Hypokalemia: Secondary | ICD-10-CM | POA: Diagnosis not present

## 2016-03-27 DIAGNOSIS — I5033 Acute on chronic diastolic (congestive) heart failure: Secondary | ICD-10-CM | POA: Diagnosis present

## 2016-03-27 DIAGNOSIS — L89893 Pressure ulcer of other site, stage 3: Secondary | ICD-10-CM | POA: Diagnosis not present

## 2016-03-27 DIAGNOSIS — R0602 Shortness of breath: Secondary | ICD-10-CM | POA: Diagnosis not present

## 2016-03-27 DIAGNOSIS — R29818 Other symptoms and signs involving the nervous system: Secondary | ICD-10-CM | POA: Diagnosis not present

## 2016-03-27 DIAGNOSIS — F0390 Unspecified dementia without behavioral disturbance: Secondary | ICD-10-CM | POA: Diagnosis present

## 2016-03-27 DIAGNOSIS — Z7401 Bed confinement status: Secondary | ICD-10-CM

## 2016-03-27 DIAGNOSIS — J690 Pneumonitis due to inhalation of food and vomit: Secondary | ICD-10-CM | POA: Diagnosis not present

## 2016-03-27 DIAGNOSIS — Z681 Body mass index (BMI) 19 or less, adult: Secondary | ICD-10-CM

## 2016-03-27 DIAGNOSIS — N182 Chronic kidney disease, stage 2 (mild): Secondary | ICD-10-CM | POA: Diagnosis present

## 2016-03-27 DIAGNOSIS — E43 Unspecified severe protein-calorie malnutrition: Secondary | ICD-10-CM | POA: Diagnosis present

## 2016-03-27 DIAGNOSIS — K219 Gastro-esophageal reflux disease without esophagitis: Secondary | ICD-10-CM | POA: Diagnosis present

## 2016-03-27 DIAGNOSIS — I5032 Chronic diastolic (congestive) heart failure: Secondary | ICD-10-CM | POA: Diagnosis present

## 2016-03-27 DIAGNOSIS — J9601 Acute respiratory failure with hypoxia: Secondary | ICD-10-CM | POA: Diagnosis present

## 2016-03-27 DIAGNOSIS — R5381 Other malaise: Secondary | ICD-10-CM | POA: Diagnosis present

## 2016-03-27 DIAGNOSIS — S72401D Unspecified fracture of lower end of right femur, subsequent encounter for closed fracture with routine healing: Secondary | ICD-10-CM | POA: Diagnosis not present

## 2016-03-27 DIAGNOSIS — R05 Cough: Secondary | ICD-10-CM | POA: Diagnosis not present

## 2016-03-27 DIAGNOSIS — Z66 Do not resuscitate: Secondary | ICD-10-CM | POA: Diagnosis present

## 2016-03-27 DIAGNOSIS — Z79899 Other long term (current) drug therapy: Secondary | ICD-10-CM

## 2016-03-27 DIAGNOSIS — I10 Essential (primary) hypertension: Secondary | ICD-10-CM | POA: Diagnosis present

## 2016-03-27 DIAGNOSIS — I130 Hypertensive heart and chronic kidney disease with heart failure and stage 1 through stage 4 chronic kidney disease, or unspecified chronic kidney disease: Secondary | ICD-10-CM | POA: Diagnosis present

## 2016-03-27 DIAGNOSIS — Z7901 Long term (current) use of anticoagulants: Secondary | ICD-10-CM

## 2016-03-27 LAB — URINALYSIS, ROUTINE W REFLEX MICROSCOPIC
Bilirubin Urine: NEGATIVE
GLUCOSE, UA: NEGATIVE mg/dL
HGB URINE DIPSTICK: NEGATIVE
Ketones, ur: NEGATIVE mg/dL
Nitrite: POSITIVE — AB
PH: 6.5 (ref 5.0–8.0)
Protein, ur: 30 mg/dL — AB
Specific Gravity, Urine: 1.02 (ref 1.005–1.030)

## 2016-03-27 LAB — CBC WITH DIFFERENTIAL/PLATELET
BASOS ABS: 0 10*3/uL (ref 0.0–0.1)
Basophils Relative: 0 %
EOS PCT: 0 %
Eosinophils Absolute: 0.1 10*3/uL (ref 0.0–0.7)
HEMATOCRIT: 29.3 % — AB (ref 36.0–46.0)
Hemoglobin: 9.6 g/dL — ABNORMAL LOW (ref 12.0–15.0)
LYMPHS ABS: 4.2 10*3/uL — AB (ref 0.7–4.0)
LYMPHS PCT: 22 %
MCH: 29 pg (ref 26.0–34.0)
MCHC: 32.8 g/dL (ref 30.0–36.0)
MCV: 88.5 fL (ref 78.0–100.0)
MONO ABS: 1.4 10*3/uL — AB (ref 0.1–1.0)
Monocytes Relative: 7 %
NEUTROS ABS: 13.8 10*3/uL — AB (ref 1.7–7.7)
Neutrophils Relative %: 71 %
Platelets: 262 10*3/uL (ref 150–400)
RBC: 3.31 MIL/uL — ABNORMAL LOW (ref 3.87–5.11)
RDW: 16.1 % — AB (ref 11.5–15.5)
WBC: 19.5 10*3/uL — ABNORMAL HIGH (ref 4.0–10.5)

## 2016-03-27 LAB — COMPREHENSIVE METABOLIC PANEL
ALBUMIN: 2.8 g/dL — AB (ref 3.5–5.0)
ALT: 9 U/L — ABNORMAL LOW (ref 14–54)
ANION GAP: 7 (ref 5–15)
AST: 21 U/L (ref 15–41)
Alkaline Phosphatase: 44 U/L (ref 38–126)
BILIRUBIN TOTAL: 0.7 mg/dL (ref 0.3–1.2)
BUN: 24 mg/dL — AB (ref 6–20)
CHLORIDE: 108 mmol/L (ref 101–111)
CO2: 26 mmol/L (ref 22–32)
Calcium: 9.1 mg/dL (ref 8.9–10.3)
Creatinine, Ser: 0.73 mg/dL (ref 0.44–1.00)
GFR calc Af Amer: >60 mL/min (ref >60)
GFR calc non Af Amer: >60 mL/min (ref >60)
GLUCOSE: 145 mg/dL — AB (ref 65–99)
POTASSIUM: 3.1 mmol/L — AB (ref 3.5–5.1)
SODIUM: 141 mmol/L (ref 135–145)
TOTAL PROTEIN: 8 g/dL (ref 6.5–8.1)

## 2016-03-27 LAB — I-STAT ARTERIAL BLOOD GAS, ED
Acid-base deficit: 1 mmol/L (ref 0.0–2.0)
BICARBONATE: 23.2 mmol/L (ref 20.0–28.0)
O2 Saturation: 89 %
PO2 ART: 55 mmHg — AB (ref 83.0–108.0)
Patient temperature: 99.4
TCO2: 24 mmol/L (ref 0–100)
pCO2 arterial: 34.9 mmHg (ref 32.0–48.0)
pH, Arterial: 7.432 (ref 7.350–7.450)

## 2016-03-27 LAB — CBC
HEMATOCRIT: 23.5 % — AB (ref 36.0–46.0)
Hemoglobin: 7.7 g/dL — ABNORMAL LOW (ref 12.0–15.0)
MCH: 28.8 pg (ref 26.0–34.0)
MCHC: 32.8 g/dL (ref 30.0–36.0)
MCV: 88 fL (ref 78.0–100.0)
Platelets: 188 10*3/uL (ref 150–400)
RBC: 2.67 MIL/uL — AB (ref 3.87–5.11)
RDW: 16.3 % — ABNORMAL HIGH (ref 11.5–15.5)
WBC: 14.9 10*3/uL — AB (ref 4.0–10.5)

## 2016-03-27 LAB — POC OCCULT BLOOD, ED: FECAL OCCULT BLD: POSITIVE — AB

## 2016-03-27 LAB — URINE MICROSCOPIC-ADD ON

## 2016-03-27 LAB — PROTIME-INR
INR: 1.25
Prothrombin Time: 15.7 seconds — ABNORMAL HIGH (ref 11.4–15.2)

## 2016-03-27 LAB — TROPONIN I
TROPONIN I: 0.04 ng/mL — AB (ref <0.03)
Troponin I: 0.03 ng/mL (ref <0.03)
Troponin I: 0.03 ng/mL (ref <0.03)

## 2016-03-27 LAB — CREATININE, SERUM
Creatinine, Ser: 0.73 mg/dL (ref 0.44–1.00)
GFR calc Af Amer: >60 mL/min (ref >60)
GFR calc non Af Amer: >60 mL/min (ref >60)

## 2016-03-27 LAB — LACTIC ACID, PLASMA
LACTIC ACID, VENOUS: 1.7 mmol/L (ref 0.5–1.9)
LACTIC ACID, VENOUS: 1.5 mmol/L (ref 0.5–1.9)

## 2016-03-27 LAB — PHOSPHORUS: Phosphorus: 2.2 mg/dL — ABNORMAL LOW (ref 2.5–4.6)

## 2016-03-27 LAB — MAGNESIUM: Magnesium: 1.7 mg/dL (ref 1.7–2.4)

## 2016-03-27 LAB — I-STAT CG4 LACTIC ACID, ED
LACTIC ACID, VENOUS: 1.86 mmol/L (ref 0.5–1.9)
Lactic Acid, Venous: 1.74 mmol/L (ref 0.5–1.9)

## 2016-03-27 MED ORDER — PIPERACILLIN-TAZOBACTAM 3.375 G IVPB
3.3750 g | Freq: Three times a day (TID) | INTRAVENOUS | Status: DC
  Administered 2016-03-27 – 2016-03-30 (×8): 3.375 g via INTRAVENOUS
  Filled 2016-03-27 (×10): qty 50

## 2016-03-27 MED ORDER — ONDANSETRON HCL 4 MG/2ML IJ SOLN
4.0000 mg | Freq: Four times a day (QID) | INTRAMUSCULAR | Status: DC | PRN
Start: 2016-03-27 — End: 2016-03-31

## 2016-03-27 MED ORDER — ALBUTEROL SULFATE (2.5 MG/3ML) 0.083% IN NEBU
2.5000 mg | INHALATION_SOLUTION | RESPIRATORY_TRACT | Status: DC | PRN
Start: 2016-03-27 — End: 2016-03-31

## 2016-03-27 MED ORDER — SODIUM CHLORIDE 0.9 % IV SOLN: 20.0000 mg/kg | Freq: Once | INTRAVENOUS | Status: DC

## 2016-03-27 MED ORDER — DEXTROSE 5 % IV SOLN
500.0000 mg | Freq: Once | INTRAVENOUS | Status: AC
  Administered 2016-03-27: 500 mg via INTRAVENOUS
  Filled 2016-03-27: qty 500

## 2016-03-27 MED ORDER — SODIUM CHLORIDE 0.9 % IV SOLN
30.0000 meq | Freq: Once | INTRAVENOUS | Status: AC
  Administered 2016-03-27: 30 meq via INTRAVENOUS
  Filled 2016-03-27: qty 15

## 2016-03-27 MED ORDER — VANCOMYCIN HCL IN DEXTROSE 750-5 MG/150ML-% IV SOLN
750.0000 mg | INTRAVENOUS | Status: DC
  Administered 2016-03-28: 750 mg via INTRAVENOUS
  Filled 2016-03-27 (×2): qty 150

## 2016-03-27 MED ORDER — LORAZEPAM 2 MG/ML IJ SOLN
0.5000 mg | Freq: Once | INTRAMUSCULAR | Status: AC
  Administered 2016-03-27: 0.5 mg via INTRAVENOUS
  Filled 2016-03-27: qty 1

## 2016-03-27 MED ORDER — VANCOMYCIN HCL 10 G IV SOLR
1250.0000 mg | Freq: Once | INTRAVENOUS | Status: AC
  Administered 2016-03-27: 1250 mg via INTRAVENOUS
  Filled 2016-03-27: qty 1250

## 2016-03-27 MED ORDER — ONDANSETRON HCL 4 MG PO TABS
4.0000 mg | ORAL_TABLET | Freq: Four times a day (QID) | ORAL | Status: DC | PRN
Start: 2016-03-27 — End: 2016-03-31

## 2016-03-27 MED ORDER — IPRATROPIUM-ALBUTEROL 0.5-2.5 (3) MG/3ML IN SOLN
3.0000 mL | Freq: Three times a day (TID) | RESPIRATORY_TRACT | Status: DC
  Administered 2016-03-27 – 2016-03-30 (×6): 3 mL via RESPIRATORY_TRACT
  Filled 2016-03-27 (×7): qty 3

## 2016-03-27 MED ORDER — DEXTROSE 5 % IV SOLN
2.0000 g | Freq: Once | INTRAVENOUS | Status: AC
  Administered 2016-03-27: 2 g via INTRAVENOUS
  Filled 2016-03-27: qty 2

## 2016-03-27 MED ORDER — DEXTROSE 5 % IV SOLN: 250.0000 mg | INTRAVENOUS | Status: DC

## 2016-03-27 MED ORDER — IPRATROPIUM-ALBUTEROL 0.5-2.5 (3) MG/3ML IN SOLN
3.0000 mL | Freq: Four times a day (QID) | RESPIRATORY_TRACT | Status: DC
  Filled 2016-03-27: qty 3

## 2016-03-27 MED ORDER — SODIUM CHLORIDE 0.9 % IV BOLUS (SEPSIS)
1000.0000 mL | Freq: Once | INTRAVENOUS | Status: AC
  Administered 2016-03-27: 1000 mL via INTRAVENOUS

## 2016-03-27 MED ORDER — SODIUM CHLORIDE 0.9 % IV SOLN
INTRAVENOUS | Status: AC
  Administered 2016-03-27: 16:00:00 via INTRAVENOUS

## 2016-03-27 MED ORDER — METHYLPREDNISOLONE SODIUM SUCC 125 MG IJ SOLR
125.0000 mg | Freq: Once | INTRAMUSCULAR | Status: AC
  Administered 2016-03-27: 125 mg via INTRAVENOUS
  Filled 2016-03-27: qty 2

## 2016-03-27 MED ORDER — KETOROLAC TROMETHAMINE 15 MG/ML IJ SOLN: 15.0000 mg | Freq: Four times a day (QID) | INTRAMUSCULAR | Status: DC | PRN

## 2016-03-27 MED ORDER — HYDRALAZINE HCL 20 MG/ML IJ SOLN: 10.0000 mg | Freq: Three times a day (TID) | INTRAMUSCULAR | Status: DC | PRN

## 2016-03-27 MED ORDER — HEPARIN SODIUM (PORCINE) 5000 UNIT/ML IJ SOLN
5000.0000 [IU] | Freq: Three times a day (TID) | INTRAMUSCULAR | Status: DC
  Administered 2016-03-27 – 2016-03-30 (×6): 5000 [IU] via SUBCUTANEOUS
  Filled 2016-03-27 (×6): qty 1

## 2016-03-27 MED ORDER — METHYLPREDNISOLONE SODIUM SUCC 125 MG IJ SOLR
125.0000 mg | Freq: Two times a day (BID) | INTRAMUSCULAR | Status: DC
  Administered 2016-03-27 – 2016-03-28 (×2): 125 mg via INTRAVENOUS
  Filled 2016-03-27 (×2): qty 2

## 2016-03-27 MED ORDER — DEXTROSE 5 % IV SOLN: 1.0000 g | INTRAVENOUS | Status: DC

## 2016-03-27 NOTE — ED Notes (Signed)
Pt returned from xray.  Reconnected to monitor

## 2016-03-27 NOTE — ED Notes (Signed)
Pt SpO2 83-88% on RA. Pt placed on 3L [place], SpO2 improved to 93%.

## 2016-03-27 NOTE — ED Notes (Signed)
Clarified repeat chest x-ray with Dr. Aggie Moats. Called xray to confirm.

## 2016-03-27 NOTE — ED Provider Notes (Signed)
[facility] DEPT Provider Note   CSN: [id number] Arrival date & time: 03/27/16  1121     History   Chief Complaint Chief Complaint  Patient presents with  . Fever    HPI Sarah Perkins is a 82 y.o. female.  Pt presents to the ED today with sob.  She lives at home with her son.  She does have dementia, but her son said she has been more confused than normal.  She did throw up Monday evening (11/27).  Son was concerned that she aspirated.  She also gets confused when she gets an UTI.  He called EMS due to the sob.  They said her initial ra o2 was 80-89%.  EMS said pt had a fever of 102. They gave her an albuterol neb en route which has helped.  Pt denies any pain.       Past Medical History:  Diagnosis Date  . Acid reflux    occasional  . Acute blood loss anemia 06/2009   received ~ 6 units blood  . Adenomatous polyp of colon 06/2009  . Aneurysm ([place]) 1972   cerebral  . CHF (congestive heart failure) ([place])   . Diverticulitis   . GI bleed 02/14/2014  . History of GI diverticular bleed 06/2009   colonoscopy with endo clipping of bleeding tic by Dr Clarene Essex.  Flex sig by Dr Paulita Fujita.  . Hypertension   . Tremor    worse on right arm  . UTI (lower urinary tract infection)     Patient Active Problem List   Diagnosis Date Noted  . Sepsis ([place]) 03/27/2016  . Acute respiratory failure with hypoxemia ([place]) 03/27/2016  . Closed fracture of right femur ([place])   . Malnutrition of moderate degree 10/24/2015  . Femur fracture, right ([place]) 10/21/2015  . Duodenal ulcer, acute 06/04/2015  . Essential hypertension 06/02/2015  . Syncope 06/02/2015  . Abdominal pain 06/02/2015  . Elevated lactic acid level 06/02/2015  . Faintness   . Dehydration 03/29/2015  . FTT (failure to thrive) in adult 03/29/2015  . Pressure ulcer 03/23/2015  . CKD (chronic kidney disease) stage 2, GFR 60-89 ml/min 03/23/2015  . Duodenitis without bleeding 03/22/2015  . Azotemia 03/22/2015  .  Acute hypokalemia 03/22/2015  . Constipation   . Cough   . Palliative care encounter   . Anemia, iron deficiency 10/08/2014  . Rectal bleeding 08/21/2014  . Chronic diastolic CHF (congestive heart failure) ([place]) 02/22/2014  . GIB (gastrointestinal bleeding) 02/14/2014  . Abnormal EKG 02/14/2014  . Dementia 01/28/2013  . Acute posthemorrhagic anemia 09/20/2011  . CHF (congestive heart failure) ([place]) 05/15/2011  . Acute on chronic kidney failure ([place]), stage ii 05/15/2011  . GERD (gastroesophageal reflux disease) 05/15/2011  . Diverticulosis 05/15/2011    Past Surgical History:  Procedure Laterality Date  . CEREBRAL ANEURYSM REPAIR  1970's  . COLONOSCOPY  09/22/2011   Procedure: COLONOSCOPY;  Surgeon: Juanita Craver, MD;  Location: [facility] ENDOSCOPY;  Service: Endoscopy;  Laterality: N/A;  wants ped scope  . COLONOSCOPY W/ CONTROL OF HEMORRHAGE  06/2009  . HIP FRACTURE SURGERY  2011  . KNEE ARTHROSCOPY    . ORIF FEMUR FRACTURE Right 10/22/2015   Procedure: OPEN REDUCTION INTERNAL FIXATION (ORIF) DISTAL FEMUR FRACTURE;  Surgeon: Mcarthur Rossetti, MD;  Location: [facility];  Service: Orthopedics;  Laterality: Right;    OB History    No data available       Home Medications    Prior to Admission medications  Medication Sig Start Date End Date Taking? Authorizing Provider  acetaminophen (TYLENOL) 325 MG tablet Take 2 tablets (650 mg total) by mouth every 6 (six) hours as needed for mild pain or moderate pain (or Fever >/= 101). 10/26/15  Yes Domenic Polite, MD  amLODipine (NORVASC) 5 MG tablet Take 5 mg by mouth daily.   Yes Historical Provider, MD  CALCIUM-MAGNESIUM-ZINC PO Take 1 capsule by mouth daily.   Yes Historical Provider, MD  cyanocobalamin 100 MCG tablet Take 100 mcg by mouth daily.   Yes Historical Provider, MD  docusate sodium (COLACE) 100 MG capsule Take 100 mg by mouth at bedtime.   Yes Historical Provider, MD  ferrous sulfate 325 (65 FE) MG tablet Take 325 mg by mouth  daily with breakfast.   Yes Historical Provider, MD  furosemide (LASIX) 20 MG tablet Take 0.5 tablets (10 mg total) by mouth daily. 10/26/15  Yes Domenic Polite, MD  pantoprazole (PROTONIX) 40 MG tablet Take 1 tablet (40 mg total) by mouth daily. 06/08/15  Yes Florencia Reasons, MD  polyethylene glycol (MIRALAX / GLYCOLAX) packet Take 17 g by mouth every morning.   Yes Historical Provider, MD  ranitidine (ZANTAC) 150 MG tablet Take 150 mg by mouth 2 (two) times daily as needed for heartburn.   Yes Historical Provider, MD  vitamin C (ASCORBIC ACID) 500 MG tablet Take 500 mg by mouth daily.   Yes Historical Provider, MD  enoxaparin (LOVENOX) 30 MG/0.3ML injection Inject 0.3 mLs (30 mg total) into the skin daily. For 2 weeks for DVT prophylaxis Patient not taking: Reported on 03/27/2016 10/26/15   Domenic Polite, MD  HYDROcodone-acetaminophen (NORCO/VICODIN) 5-325 MG tablet Take 1 tablet by mouth every 6 (six) hours as needed for severe pain (for severe pain only). Patient not taking: Reported on 03/27/2016 10/26/15   Domenic Polite, MD  levofloxacin (LEVAQUIN) 500 MG tablet Take 500 mg by mouth daily. Started 06/30 for 5 days    Historical Provider, MD    Family History Family History  Problem Relation Age of Onset  . Stroke Mother   . Lung disease Father   . Stroke Brother     Social History Social History  Substance Use Topics  . Smoking status: Never Smoker  . Smokeless tobacco: Never Used  . Alcohol use No     Allergies   Patient has no known allergies.   Review of Systems Review of Systems  Constitutional: Positive for fever.  Respiratory: Positive for shortness of breath.   All other systems reviewed and are negative.    Physical Exam Updated Vital Signs BP 152/61   Pulse 118   Temp (S) 99.4 F (37.4 C) (Rectal)   Resp 19   SpO2 99%   Physical Exam  Constitutional: She appears well-developed. She appears distressed.  HENT:  Head: Normocephalic and atraumatic.  Right Ear:  External ear normal.  Left Ear: External ear normal.  Nose: Nose normal.  Mouth/Throat: Oropharynx is clear and moist.  Eyes: Conjunctivae and EOM are normal. Pupils are equal, round, and reactive to light.  Neck: Normal range of motion. Neck supple.  Cardiovascular: Regular rhythm, normal heart sounds and intact distal pulses.  Tachycardia present.   Pulmonary/Chest: Breath sounds normal. Tachypnea noted.  Abdominal: Soft. Bowel sounds are normal.  Musculoskeletal: Normal range of motion.  Neurological: She is alert.  Pt is moving all 4 extremities.  Pt is awake and alert.  Skin: Skin is warm.  Psychiatric: She has a normal mood and affect.  Her behavior is normal. Judgment and thought content normal.  Nursing note and vitals reviewed.    ED Treatments / Results  Labs (all labs ordered are listed, but only abnormal results are displayed) Labs Reviewed  COMPREHENSIVE METABOLIC PANEL - Abnormal; Notable for the following:       Result Value   Potassium 3.1 (*)    Glucose, Bld 145 (*)    BUN 24 (*)    Albumin 2.8 (*)    ALT 9 (*)    All other components within normal limits  CBC WITH DIFFERENTIAL/PLATELET - Abnormal; Notable for the following:    WBC 19.5 (*)    RBC 3.31 (*)    Hemoglobin 9.6 (*)    HCT 29.3 (*)    RDW 16.1 (*)    Neutro Abs 13.8 (*)    Lymphs Abs 4.2 (*)    Monocytes Absolute 1.4 (*)    All other components within normal limits  URINALYSIS, ROUTINE W REFLEX MICROSCOPIC (NOT AT [facility]) - Abnormal; Notable for the following:    APPearance HAZY (*)    Protein, ur 30 (*)    Nitrite POSITIVE (*)    Leukocytes, UA MODERATE (*)    All other components within normal limits  TROPONIN I - Abnormal; Notable for the following:    Troponin I 0.03 (*)    All other components within normal limits  PROTIME-INR - Abnormal; Notable for the following:    Prothrombin Time 15.7 (*)    All other components within normal limits  URINE MICROSCOPIC-ADD ON - Abnormal; Notable  for the following:    Squamous Epithelial / LPF 6-30 (*)    Bacteria, UA FEW (*)    Casts GRANULAR CAST (*)    All other components within normal limits  I-STAT ARTERIAL BLOOD GAS, ED - Abnormal; Notable for the following:    pO2, Arterial 55.0 (*)    All other components within normal limits  POC OCCULT BLOOD, ED - Abnormal; Notable for the following:    Fecal Occult Bld POSITIVE (*)    All other components within normal limits  CULTURE, BLOOD (ROUTINE X 2)  CULTURE, BLOOD (ROUTINE X 2)  URINE CULTURE  LACTIC ACID, PLASMA  LACTIC ACID, PLASMA  TROPONIN I  TROPONIN I  MAGNESIUM  PHOSPHORUS  I-STAT CG4 LACTIC ACID, ED  I-STAT CG4 LACTIC ACID, ED    EKG  EKG Interpretation  Date/Time:  Wednesday March 27 2016 11:31:58 EST Ventricular Rate:  124 PR Interval:    QRS Duration: 94 QT Interval:  329 QTC Calculation: 453 R Axis:   82 Text Interpretation:  Atrial fibrillation Ventricular premature complex Borderline right axis deviation Borderline repolarization abnormality Artifact in lead(s) II V1 Confirmed by Gilford Raid MD, [place] ([id number]) on 03/27/2016 11:46:34 AM       Radiology Dg Chest Port 1 View  Result Date: 03/27/2016 CLINICAL DATA:  Productive cough and shortness of breath. EXAM: PORTABLE CHEST 1 VIEW COMPARISON:  10/24/2015 FINDINGS: Mild enlargement of the cardiac silhouette is stable. Atherosclerotic calcifications at the aortic arch. Improved aeration at the left lung base compared to the previous examination. Prominent lung markings at both lung bases may represent chronic changes. There is no focal airspace disease or pulmonary edema. Evidence for scarring at the lung apices. IMPRESSION: No acute chest findings. Stable mild cardiomegaly. Aortic atherosclerosis. Electronically Signed   By: Markus Daft M.D.   On: 03/27/2016 12:05    Procedures Procedures (including critical care time)  Medications Ordered  in ED Medications  azithromycin (ZITHROMAX) 500 mg in  dextrose 5 % 250 mL IVPB (500 mg Intravenous New Bag/Given 03/27/16 1354)  methylPREDNISolone sodium succinate (SOLU-MEDROL) 125 mg/2 mL injection 125 mg (125 mg Intravenous Given 03/27/16 1231)  cefTRIAXone (ROCEPHIN) 2 g in dextrose 5 % 50 mL IVPB (0 g Intravenous Stopped 03/27/16 1310)  LORazepam (ATIVAN) injection 0.5 mg (0.5 mg Intravenous Given 03/27/16 1231)  sodium chloride 0.9 % bolus 1,000 mL (1,000 mLs Intravenous New Bag/Given 03/27/16 1235)  sodium chloride 0.9 % bolus 1,000 mL (1,000 mLs Intravenous New Bag/Given 03/27/16 1354)     Initial Impression / Assessment and Plan / ED Course  I have reviewed the triage vital signs and the nursing notes.  Pertinent labs & imaging results that were available during my care of the patient were reviewed by me and considered in my medical decision making (see chart for details).  Clinical Course    This patient meets SIRS Criteria and may be septic. SIRS = Systemic Inflammatory Response Syndrome  Best Practice Recommends:   Notify the nurse immediately to increase monitoring of patient.   The recent clinical data is shown below. Vitals:   03/27/16 1215 03/27/16 1230 03/27/16 1238 03/27/16 1330  BP: 144/61 147/69  152/61  Pulse: (!) 124 (!) 124  118  Resp: 23 26  19   Temp:      TempSrc:      SpO2: 96% (!) 87% (S) 95% 99%     Pt is feeling better after the neb and fluids.  Hr is still a little fast, but it has improbed.  I will give more IVFs.  Pt does not have pna on cxr, but strongly suspect pt has pna clinically.  Bp has remained stable.    Pt d/w Dr. Aggie Moats ([facility]) who will admit pt.   Final Clinical Impressions(s) / ED Diagnoses   Final diagnoses:  Acute bronchitis, unspecified organism  Sepsis, due to unspecified organism [facility])  Hypoxia    New Prescriptions New Prescriptions   No medications on file     Isla Pence, MD 03/27/16 1422

## 2016-03-27 NOTE — ED Notes (Signed)
Unsuccessful attempt at in and out cath. Pt will not cooperate or tolerate procedure.

## 2016-03-27 NOTE — ED Notes (Signed)
Dr. Aggie Moats requested RT evaluate pt. Sarah Perkins, RT and this RN assessed pt. No apparent distress noted. Pt resting comfortably in stretcher. Maintaining SpO2 95% on 3L [place].

## 2016-03-27 NOTE — H&P (Signed)
[facility] History and Physical  KAYONA HYMAN [id number] DOB: 1918/02/13 DOA: 03/27/2016  Referring physician:  PCP: Thressa Sheller, MD   Chief Complaint: Confusion & SOB  reported by family  HPI: Sarah Perkins is a 82 y.o. female with past medical history significant for aneurysm, congestive heart failure, high blood pressure, urinarytract infections presents emergency room with weakness. History gathered from patient is limited due to mental status. No family at bedside. Spoke to emergency room provider and chart reviewed. Per patient's son patient vomited on Sunday. He is concerned that the patient aspirated. Patient indicates that she has vomited numerous times but is unable to specify how many times. Patient is aggressively become short of breath. Rescue squad was called due to shortness of breath. Patient was found to have O2 sat saturation in the 80s. She was placed on oxygen and given DuoNeb and Atrovent.  ED course: In the emergency room patient continued to have low O2 saturation on room air was placed back on 3 L nasal cannula. She was given steroids. Patient was also given azithromycin and Rocephin. Chest x-ray was negative. Code sepsis called. Hospitalist consulted for admission.   Review of Systems:  Limited secondary to mental status   Past Medical History:  Diagnosis Date  . Acid reflux    occasional  . Acute blood loss anemia 06/2009   received ~ 6 units blood  . Adenomatous polyp of colon 06/2009  . Aneurysm ([place]) 1972   cerebral  . CHF (congestive heart failure) ([place])   . Diverticulitis   . GI bleed 02/14/2014  . History of GI diverticular bleed 06/2009   colonoscopy with endo clipping of bleeding tic by Dr Clarene Essex.  Flex sig by Dr Paulita Fujita.  . Hypertension   . Tremor    worse on right arm  . UTI (lower urinary tract infection)    Past Surgical History:  Procedure Laterality Date  . CEREBRAL ANEURYSM REPAIR  1970's  . COLONOSCOPY   09/22/2011   Procedure: COLONOSCOPY;  Surgeon: Juanita Craver, MD;  Location: [facility] ENDOSCOPY;  Service: Endoscopy;  Laterality: N/A;  wants ped scope  . COLONOSCOPY W/ CONTROL OF HEMORRHAGE  06/2009  . HIP FRACTURE SURGERY  2011  . KNEE ARTHROSCOPY    . ORIF FEMUR FRACTURE Right 10/22/2015   Procedure: OPEN REDUCTION INTERNAL FIXATION (ORIF) DISTAL FEMUR FRACTURE;  Surgeon: Mcarthur Rossetti, MD;  Location: [facility];  Service: Orthopedics;  Laterality: Right;   Social History:  reports that she has never smoked. She has never used smokeless tobacco. She reports that she does not drink alcohol or use drugs.  No Known Allergies  Family History  Problem Relation Age of Onset  . Stroke Mother   . Lung disease Father   . Stroke Brother      Prior to Admission medications   Medication Sig Start Date End Date Taking? Authorizing Provider  acetaminophen (TYLENOL) 325 MG tablet Take 2 tablets (650 mg total) by mouth every 6 (six) hours as needed for mild pain or moderate pain (or Fever >/= 101). 10/26/15  Yes Domenic Polite, MD  amLODipine (NORVASC) 5 MG tablet Take 5 mg by mouth daily.   Yes Historical Provider, MD  CALCIUM-MAGNESIUM-ZINC PO Take 1 capsule by mouth daily.   Yes Historical Provider, MD  cyanocobalamin 100 MCG tablet Take 100 mcg by mouth daily.   Yes Historical Provider, MD  docusate sodium (COLACE) 100 MG capsule Take 100 mg by mouth at bedtime.   Yes  Historical Provider, MD  ferrous sulfate 325 (65 FE) MG tablet Take 325 mg by mouth daily with breakfast.   Yes Historical Provider, MD  furosemide (LASIX) 20 MG tablet Take 0.5 tablets (10 mg total) by mouth daily. 10/26/15  Yes Domenic Polite, MD  pantoprazole (PROTONIX) 40 MG tablet Take 1 tablet (40 mg total) by mouth daily. 06/08/15  Yes Florencia Reasons, MD  polyethylene glycol (MIRALAX / GLYCOLAX) packet Take 17 g by mouth every morning.   Yes Historical Provider, MD  ranitidine (ZANTAC) 150 MG tablet Take 150 mg by mouth 2 (two) times daily  as needed for heartburn.   Yes Historical Provider, MD  vitamin C (ASCORBIC ACID) 500 MG tablet Take 500 mg by mouth daily.   Yes Historical Provider, MD  enoxaparin (LOVENOX) 30 MG/0.3ML injection Inject 0.3 mLs (30 mg total) into the skin daily. For 2 weeks for DVT prophylaxis Patient not taking: Reported on 03/27/2016 10/26/15   Domenic Polite, MD  HYDROcodone-acetaminophen (NORCO/VICODIN) 5-325 MG tablet Take 1 tablet by mouth every 6 (six) hours as needed for severe pain (for severe pain only). Patient not taking: Reported on 03/27/2016 10/26/15   Domenic Polite, MD  levofloxacin (LEVAQUIN) 500 MG tablet Take 500 mg by mouth daily. Started 06/30 for 5 days    Historical Provider, MD   Physical Exam: Vitals:   03/27/16 1215 03/27/16 1230 03/27/16 1238 03/27/16 1330  BP: 144/61 147/69  152/61  Pulse: (!) 124 (!) 124  118  Resp: 23 26  19   Temp:      TempSrc:      SpO2: 96% (!) 87% (S) 95% 99%    Wt Readings from Last 3 Encounters:  10/26/15 62.4 kg (137 lb 8 oz)  06/02/15 56.9 kg (125 lb 8 oz)  03/24/15 61.8 kg (136 lb 3.9 oz)    General:  Appears calm and uncomfortable, moderate to severe distress Eyes:  PERRL, EOMI, normal lids, iris ENT:  grossly normal hearing, lips & tongue; dry oral mucosa Neck:  no LAD, masses or thyromegaly Cardiovascular:  Tachycardia regular rhythm, no m/r/g. No LE edema.  Respiratory:  Patient with audible rhonchorous breath sounds, extended expiratory phase, tachypnea, use of accessory muscles Abdomen:  soft, no rigidity or rebound, ND; left lower quadrant tenderness Skin:  no rash or induration seen on limited exam; poor skin turgor Musculoskeletal:  grossly normal tone BUE/BLE Psychiatric:  grossly normal mood and affect, speech fluent and appropriate Neurologic:  CN 2-12 grossly intact, moves all extremities in coordinated fashion.          Labs on Admission:  Basic Metabolic Panel:  Recent Labs Lab 03/27/16 1138  NA 141  K 3.1*  CL 108    CO2 26  GLUCOSE 145*  BUN 24*  CREATININE 0.73  CALCIUM 9.1   Liver Function Tests:  Recent Labs Lab 03/27/16 1138  AST 21  ALT 9*  ALKPHOS 44  BILITOT 0.7  PROT 8.0  ALBUMIN 2.8*   No results for input(s): LIPASE, AMYLASE in the last 168 hours. No results for input(s): AMMONIA in the last 168 hours. CBC:  Recent Labs Lab 03/27/16 1138  WBC 19.5*  NEUTROABS 13.8*  HGB 9.6*  HCT 29.3*  MCV 88.5  PLT 262   Cardiac Enzymes:  Recent Labs Lab 03/27/16 1138  TROPONINI 0.03*    BNP (last 3 results)  Recent Labs  06/02/15 0516  BNP 61.4    ProBNP (last 3 results) No results for input(s): PROBNP in  the last 8760 hours.   Creatinine clearance cannot be calculated (Unknown ideal weight.)  CBG: No results for input(s): GLUCAP in the last 168 hours.  Radiological Exams on Admission: Dg Chest Port 1 View  Result Date: 03/27/2016 CLINICAL DATA:  Productive cough and shortness of breath. EXAM: PORTABLE CHEST 1 VIEW COMPARISON:  10/24/2015 FINDINGS: Mild enlargement of the cardiac silhouette is stable. Atherosclerotic calcifications at the aortic arch. Improved aeration at the left lung base compared to the previous examination. Prominent lung markings at both lung bases may represent chronic changes. There is no focal airspace disease or pulmonary edema. Evidence for scarring at the lung apices. IMPRESSION: No acute chest findings. Stable mild cardiomegaly. Aortic atherosclerosis. Electronically Signed   By: Markus Daft M.D.   On: 03/27/2016 12:05    EKG: Independently reviewed. VR 124, QRS 94, QTc 453 no stemi, no acute cnages when compared to 09/2015 EKG  Assessment/Plan Principal Problem:   Sepsis ([place]) Active Problems:   Dementia   Chronic diastolic CHF (congestive heart failure) ([facility])   Acute hypokalemia   Essential hypertension   Acute respiratory failure with hypoxemia ([facility])   Sepsis 2/2 aspiration pna Patient hemodynamically stable Given  azithromycin and Rocephin emergency room, will not continue Starting patient on vancomycin and Zosyn Urine culture pending Blood cultures 2 pending Patient given 2000 mL of fluid in the emergency room Lactic acid normal, will trend  AMS NPO for Now CT head ordered  Troponemia Will trend, likely due to demand and hypoxia 0.03 on admit EKG in AM  Acute hypoxic respiratory failure with hypoxia Scheduled DuoNeb's When necessary albuterol Schedule steroids RT consult Getting repeat CXR  CHF Hold lasix for now  GERD Hold OP zantac  Acute low K Contacted pharmacy for IV alternative  Hypertension When necessary hydralazine 10 mg IV as needed for severe blood pressure Hold norvasc  Code Status:  FULL DVT Prophylaxis: heparin Family Communication: none available Disposition Plan: Pending Improvement    Elwin Mocha, MD Family Medicine [facility] [email or web address] Password [email or web address]

## 2016-03-27 NOTE — Progress Notes (Addendum)
Pharmacy Antibiotic Note  Sarah Perkins is a 82 y.o. female admitted on 03/27/2016 with pneumonia.  Pharmacy has been consulted for vancomycin and zosyn dosing for aspiration PNA.  She has been given Rocephin 2 gm in the ED today at 1231 and azithromycin.  D/w Dr Aggie Moats to change azith/CTX to vanc/zosyn for aspiration PNA.  WBC 19.5, creat 0.73, lactate 1.86. Temp 99.4, wt 62.4 kg in June 2017. CXR neg. Creat cl ~ 44 ml/min.   Plan:  vancomycin 1250 mg IV x 1 dose followed by 750 mg IV q24 Zosyn 3.375 mg IV q8hr F/u renal fxn, wbc, temp, culture data Vancomycin levels as needed  Temp (24hrs), Avg:99.4 F (37.4 C), Min:99.4 F (37.4 C), Max:99.4 F (37.4 C)   Recent Labs Lab 03/27/16 1138 03/27/16 1157  WBC 19.5*  --   CREATININE 0.73  --   LATICACIDVEN  --  1.86    CrCl cannot be calculated (Unknown ideal weight.).    No Known Allergies   Eudelia Bunch, Pharm.D. [id number] 03/27/2016 2:47 PM

## 2016-03-27 NOTE — ED Notes (Signed)
Lab at bedside

## 2016-03-27 NOTE — ED Triage Notes (Signed)
Per EMS - pt from home, lives with son. Pt forgot to take GERD medication Monday evening, vomited 1x after eating. Possible aspiration. Pt has had decreased LOC, increased shortness of breath, increased RR. Hx dementia, alert to person. Cough present. Given .5 atrovent, 10 albuterol w/ wheezing and rattling heard on assessment. Initial SpO2 80-89% on RA, improved w/ breathing tx.

## 2016-03-28 ENCOUNTER — Observation Stay (HOSPITAL_COMMUNITY): Payer: Medicare Other

## 2016-03-28 DIAGNOSIS — J690 Pneumonitis due to inhalation of food and vomit: Secondary | ICD-10-CM | POA: Diagnosis present

## 2016-03-28 DIAGNOSIS — Z66 Do not resuscitate: Secondary | ICD-10-CM | POA: Diagnosis present

## 2016-03-28 DIAGNOSIS — R6520 Severe sepsis without septic shock: Secondary | ICD-10-CM | POA: Diagnosis not present

## 2016-03-28 DIAGNOSIS — J90 Pleural effusion, not elsewhere classified: Secondary | ICD-10-CM | POA: Diagnosis not present

## 2016-03-28 DIAGNOSIS — E876 Hypokalemia: Secondary | ICD-10-CM | POA: Diagnosis not present

## 2016-03-28 DIAGNOSIS — Z79891 Long term (current) use of opiate analgesic: Secondary | ICD-10-CM | POA: Diagnosis not present

## 2016-03-28 DIAGNOSIS — A419 Sepsis, unspecified organism: Principal | ICD-10-CM

## 2016-03-28 DIAGNOSIS — I5032 Chronic diastolic (congestive) heart failure: Secondary | ICD-10-CM

## 2016-03-28 DIAGNOSIS — R112 Nausea with vomiting, unspecified: Secondary | ICD-10-CM | POA: Diagnosis not present

## 2016-03-28 DIAGNOSIS — R4182 Altered mental status, unspecified: Secondary | ICD-10-CM | POA: Diagnosis present

## 2016-03-28 DIAGNOSIS — K219 Gastro-esophageal reflux disease without esophagitis: Secondary | ICD-10-CM | POA: Diagnosis present

## 2016-03-28 DIAGNOSIS — I130 Hypertensive heart and chronic kidney disease with heart failure and stage 1 through stage 4 chronic kidney disease, or unspecified chronic kidney disease: Secondary | ICD-10-CM | POA: Diagnosis present

## 2016-03-28 DIAGNOSIS — Z681 Body mass index (BMI) 19 or less, adult: Secondary | ICD-10-CM | POA: Diagnosis not present

## 2016-03-28 DIAGNOSIS — J209 Acute bronchitis, unspecified: Secondary | ICD-10-CM | POA: Diagnosis not present

## 2016-03-28 DIAGNOSIS — Z79899 Other long term (current) drug therapy: Secondary | ICD-10-CM | POA: Diagnosis not present

## 2016-03-28 DIAGNOSIS — E43 Unspecified severe protein-calorie malnutrition: Secondary | ICD-10-CM | POA: Diagnosis present

## 2016-03-28 DIAGNOSIS — Z7401 Bed confinement status: Secondary | ICD-10-CM | POA: Diagnosis not present

## 2016-03-28 DIAGNOSIS — F0390 Unspecified dementia without behavioral disturbance: Secondary | ICD-10-CM | POA: Diagnosis present

## 2016-03-28 DIAGNOSIS — Z7901 Long term (current) use of anticoagulants: Secondary | ICD-10-CM | POA: Diagnosis not present

## 2016-03-28 DIAGNOSIS — J9601 Acute respiratory failure with hypoxia: Secondary | ICD-10-CM | POA: Diagnosis not present

## 2016-03-28 DIAGNOSIS — R5381 Other malaise: Secondary | ICD-10-CM | POA: Diagnosis present

## 2016-03-28 DIAGNOSIS — N182 Chronic kidney disease, stage 2 (mild): Secondary | ICD-10-CM | POA: Diagnosis present

## 2016-03-28 DIAGNOSIS — I248 Other forms of acute ischemic heart disease: Secondary | ICD-10-CM | POA: Diagnosis present

## 2016-03-28 DIAGNOSIS — I5033 Acute on chronic diastolic (congestive) heart failure: Secondary | ICD-10-CM | POA: Diagnosis present

## 2016-03-28 LAB — BASIC METABOLIC PANEL
Anion gap: 4 — ABNORMAL LOW (ref 5–15)
BUN: 21 mg/dL — ABNORMAL HIGH (ref 6–20)
CALCIUM: 8.3 mg/dL — AB (ref 8.9–10.3)
CO2: 23 mmol/L (ref 22–32)
CREATININE: 0.71 mg/dL (ref 0.44–1.00)
Chloride: 114 mmol/L — ABNORMAL HIGH (ref 101–111)
GFR calc non Af Amer: >60 mL/min (ref >60)
Glucose, Bld: 154 mg/dL — ABNORMAL HIGH (ref 65–99)
Potassium: 4 mmol/L (ref 3.5–5.1)
SODIUM: 141 mmol/L (ref 135–145)

## 2016-03-28 LAB — CBC
HCT: 24.4 % — ABNORMAL LOW (ref 36.0–46.0)
Hemoglobin: 8 g/dL — ABNORMAL LOW (ref 12.0–15.0)
MCH: 29.1 pg (ref 26.0–34.0)
MCHC: 32.8 g/dL (ref 30.0–36.0)
MCV: 88.7 fL (ref 78.0–100.0)
PLATELETS: 177 10*3/uL (ref 150–400)
RBC: 2.75 MIL/uL — AB (ref 3.87–5.11)
RDW: 16.5 % — ABNORMAL HIGH (ref 11.5–15.5)
WBC: 13.7 10*3/uL — AB (ref 4.0–10.5)

## 2016-03-28 LAB — URINE CULTURE

## 2016-03-28 LAB — BLOOD CULTURE ID PANEL (REFLEXED)
ACINETOBACTER BAUMANNII: NOT DETECTED
CANDIDA ALBICANS: NOT DETECTED
CANDIDA GLABRATA: NOT DETECTED
Candida krusei: NOT DETECTED
Candida parapsilosis: NOT DETECTED
Candida tropicalis: NOT DETECTED
ENTEROBACTER CLOACAE COMPLEX: NOT DETECTED
ENTEROBACTERIACEAE SPECIES: NOT DETECTED
ENTEROCOCCUS SPECIES: NOT DETECTED
Escherichia coli: NOT DETECTED
Haemophilus influenzae: NOT DETECTED
Klebsiella oxytoca: NOT DETECTED
Klebsiella pneumoniae: NOT DETECTED
LISTERIA MONOCYTOGENES: NOT DETECTED
Methicillin resistance: NOT DETECTED
NEISSERIA MENINGITIDIS: NOT DETECTED
PSEUDOMONAS AERUGINOSA: NOT DETECTED
Proteus species: NOT DETECTED
STAPHYLOCOCCUS SPECIES: DETECTED — AB
STREPTOCOCCUS AGALACTIAE: NOT DETECTED
STREPTOCOCCUS PYOGENES: NOT DETECTED
STREPTOCOCCUS SPECIES: NOT DETECTED
Serratia marcescens: NOT DETECTED
Staphylococcus aureus (BCID): NOT DETECTED
Streptococcus pneumoniae: NOT DETECTED

## 2016-03-28 LAB — TROPONIN I: Troponin I: 0.04 ng/mL (ref <0.03)

## 2016-03-28 LAB — MRSA PCR SCREENING: MRSA by PCR: NEGATIVE

## 2016-03-28 MED ORDER — FUROSEMIDE 10 MG/ML IJ SOLN
40.0000 mg | Freq: Two times a day (BID) | INTRAMUSCULAR | Status: DC
  Administered 2016-03-28 – 2016-03-30 (×5): 40 mg via INTRAVENOUS
  Filled 2016-03-28 (×4): qty 4

## 2016-03-28 NOTE — Progress Notes (Signed)
PROGRESS NOTE    Sarah Perkins  [id number] DOB: 03/22/1918 DOA: 03/27/2016 PCP: Thressa Sheller, MD  Brief Narrative: Sarah Perkins is a 82 y.o. female with past medical history significant for Dementia, aneurysm, congestive heart failure, HTN, UTIs presented to the ED with weakness. Per patient's son patient vomited on Sunday, concerned that the patient aspirated. Patient indicates that she has vomited numerous times but is unable to specify how many times. Patient is aggressively become short of breath.  In ED, hypoxic, confused, CXR with Pneumonia and CHF  Assessment & Plan:   Principal Problem:   Sepsis/Aspiration pneumonia -h/o GERD, possible dysphagia from history -check SLP eval -continue Vanc/Zosyn -FU Blood Cx  Acute on chronic diastolic CHF -IV lasix today -ECHO with EF of 65%   Dementia -stable   Acute hypokalemia -replaced   Debilitated and bed bound  -since hip fracture earlier this year -Pt eval tomorrow    Essential hypertension -stable, monitor  DVT prophylaxis:Hep SQ Code Status:discussed DNR with son, he is agreeable Family Communication:son at bedside Disposition Plan: Home when improved  Subjective: Breathing better  Objective: Vitals:   03/27/16 2035 03/28/16 0414 03/28/16 0752 03/28/16 1005  BP: (!) 123/59 (!) 130/54  131/66  Pulse: 98 85  (!) 102  Resp: (!) 24 (!) 22  20  Temp: 98.2 F (36.8 C) 97.7 F (36.5 C)  99.3 F (37.4 C)  TempSrc: Oral Oral  Oral  SpO2:  96% 96% 98%  Weight: 61.7 kg (136 lb)       Intake/Output Summary (Last 24 hours) at 03/28/16 1242 Last data filed at 03/28/16 1005  Gross per 24 hour  Intake             1510 ml  Output                0 ml  Net             15 10 ml   Filed Weights   03/27/16 2035  Weight: 61.7 kg (136 lb)    Examination:  General exam: Appears calm and comfortable, oriented to self and place Respiratory system: ronchi at bases Cardiovascular system: S1 & S2  heard, RRR. Systolic murmur Gastrointestinal system: Abdomen is nondistended, soft and nontender.Normal bowel sounds heard. Central nervous system: Alert and oriented. No focal neurological deficits. Extremities: Symmetric 5 x 5 power. Skin: No rashes, lesions or ulcers Psychiatry: Judgement and insight appear normal. Mood & affect appropriate.     Data Reviewed: I have personally reviewed following labs and imaging studies  CBC:  Recent Labs Lab 03/27/16 1138 03/27/16 1806 03/28/16 0231  WBC 19.5* 14.9* 13.7*  NEUTROABS 13.8*  --   --   HGB 9.6* 7.7* 8.0*  HCT 29.3* 23.5* 24.4*  MCV 88.5 88.0 88.7  PLT 262 188 [id number]   Basic Metabolic Panel:  Recent Labs Lab 03/27/16 1138 03/27/16 1603 03/27/16 1806 03/28/16 0231  NA 141  --   --  141  K 3.1*  --   --  4.0  CL 108  --   --  114*  CO2 26  --   --  23  GLUCOSE 145*  --   --  154*  BUN 24*  --   --  21*  CREATININE 0.73  --  0.73 0.71  CALCIUM 9.1  --   --  8.3*  MG  --  1.7  --   --   PHOS  --  2.2*  --   --  GFR: Estimated Creatinine Clearance: 40 mL/min (by C-G formula based on SCr of 0.71 mg/dL). Liver Function Tests:  Recent Labs Lab 03/27/16 1138  AST 21  ALT 9*  ALKPHOS 44  BILITOT 0.7  PROT 8.0  ALBUMIN 2.8*   No results for input(s): LIPASE, AMYLASE in the last 168 hours. No results for input(s): AMMONIA in the last 168 hours. Coagulation Profile:  Recent Labs Lab 03/27/16 1138  INR 1.25   Cardiac Enzymes:  Recent Labs Lab 03/27/16 1138 03/27/16 1603 03/27/16 2055 03/28/16 0231  TROPONINI 0.03* 0.04* 0.03* 0.04*   BNP (last 3 results) No results for input(s): PROBNP in the last 8760 hours. HbA1C: No results for input(s): HGBA1C in the last 72 hours. CBG: No results for input(s): GLUCAP in the last 168 hours. Lipid Profile: No results for input(s): CHOL, HDL, LDLCALC, TRIG, CHOLHDL, LDLDIRECT in the last 72 hours. Thyroid Function Tests: No results for input(s): TSH, T4TOTAL,  FREET4, T3FREE, THYROIDAB in the last 72 hours. Anemia Panel: No results for input(s): VITAMINB12, FOLATE, FERRITIN, TIBC, IRON, RETICCTPCT in the last 72 hours. Urine analysis:    Component Value Date/Time   COLORURINE YELLOW 03/27/2016 1308   APPEARANCEUR HAZY (A) 03/27/2016 1308   LABSPEC 1.020 03/27/2016 1308   PHURINE 6.5 03/27/2016 1308   GLUCOSEU NEGATIVE 03/27/2016 1308   HGBUR NEGATIVE 03/27/2016 1308   BILIRUBINUR NEGATIVE 03/27/2016 1308   KETONESUR NEGATIVE 03/27/2016 1308   PROTEINUR 30 (A) 03/27/2016 1308   UROBILINOGEN 0.2 10/08/2014 1225   NITRITE POSITIVE (A) 03/27/2016 1308   LEUKOCYTESUR MODERATE (A) 03/27/2016 1308   Sepsis Labs: @LABRCNTIP (procalcitonin:4,lacticidven:4)  ) Recent Results (from the past 240 hour(s))  Blood Culture (routine x 2)     Status: None (Preliminary result)   Collection Time: 03/27/16 11:35 AM  Result Value Ref Range Status   Specimen Description BLOOD LEFT FOREARM  Final   Special Requests BOTTLES DRAWN AEROBIC AND ANAEROBIC  5CC  Final   Culture  Setup Time   Final    GRAM POSITIVE COCCI IN CLUSTERS IN BOTH AEROBIC AND ANAEROBIC BOTTLES Organism ID to follow CRITICAL RESULT CALLED TO, READ BACK BY AND VERIFIED WITH: M TURNER 03/28/16 @ 20 M VESTAL    Culture GRAM POSITIVE COCCI  Final   Report Status PENDING  Incomplete  Blood Culture ID Panel (Reflexed)     Status: Abnormal   Collection Time: 03/27/16 11:35 AM  Result Value Ref Range Status   Enterococcus species NOT DETECTED NOT DETECTED Final   Listeria monocytogenes NOT DETECTED NOT DETECTED Final   Staphylococcus species DETECTED (A) NOT DETECTED Final    Comment: CRITICAL RESULT CALLED TO, READ BACK BY AND VERIFIED WITH: M TURNER 03/28/16 @ 74 M VESTAL    Staphylococcus aureus NOT DETECTED NOT DETECTED Final   Methicillin resistance NOT DETECTED NOT DETECTED Final   Streptococcus species NOT DETECTED NOT DETECTED Final   Streptococcus agalactiae NOT DETECTED NOT  DETECTED Final   Streptococcus pneumoniae NOT DETECTED NOT DETECTED Final   Streptococcus pyogenes NOT DETECTED NOT DETECTED Final   Acinetobacter baumannii NOT DETECTED NOT DETECTED Final   Enterobacteriaceae species NOT DETECTED NOT DETECTED Final   Enterobacter cloacae complex NOT DETECTED NOT DETECTED Final   Escherichia coli NOT DETECTED NOT DETECTED Final   Klebsiella oxytoca NOT DETECTED NOT DETECTED Final   Klebsiella pneumoniae NOT DETECTED NOT DETECTED Final   Proteus species NOT DETECTED NOT DETECTED Final   Serratia marcescens NOT DETECTED NOT DETECTED Final   Haemophilus  influenzae NOT DETECTED NOT DETECTED Final   Neisseria meningitidis NOT DETECTED NOT DETECTED Final   Pseudomonas aeruginosa NOT DETECTED NOT DETECTED Final   Candida albicans NOT DETECTED NOT DETECTED Final   Candida glabrata NOT DETECTED NOT DETECTED Final   Candida krusei NOT DETECTED NOT DETECTED Final   Candida parapsilosis NOT DETECTED NOT DETECTED Final   Candida tropicalis NOT DETECTED NOT DETECTED Final  Urine culture     Status: Abnormal   Collection Time: 03/27/16  1:08 PM  Result Value Ref Range Status   Specimen Description URINE, RANDOM  Final   Special Requests NONE  Final   Culture MULTIPLE SPECIES PRESENT, SUGGEST RECOLLECTION (A)  Final   Report Status 03/28/2016 FINAL  Final         Radiology Studies: Ct Head Wo Contrast  Result Date: 03/27/2016 CLINICAL DATA:  Hypoxia. EXAM: CT HEAD WITHOUT CONTRAST TECHNIQUE: Contiguous axial images were obtained from the base of the skull through the vertex without intravenous contrast. COMPARISON:  10/01/2015 FINDINGS: BRAIN: There is sulcal and ventricular prominence consistent with superficial and central atrophy. No intraparenchymal hemorrhage, mass effect nor midline shift. There are extensive periventricular and subcortical white matter hypodensities consistent with chronic small vessel ischemic disease. No acute large vascular territory  infarcts. No abnormal extra-axial fluid collections. Basal cisterns are patent. VASCULAR: Moderate calcific atherosclerosis of the carotid siphons. Beam hardening artifacts along the anterior skull base from aneurysm clips as before. SKULL: No skull fracture. Prior right frontal craniotomy. No significant scalp soft tissue swelling. SINUSES/ORBITS: The mastoid air-cells are clear. The included paranasal sinuses are well-aerated.The included ocular globes and orbital contents are non-suspicious. Bilateral lens replacement surgical change. OTHER: None. IMPRESSION: Chronic central and superficial atrophy with moderate to marked chronic small vessel ischemic disease of periventricular and subcortical white matter. No acute intracranial hemorrhage, acute large vascular territory infarction, mass or midline shift. Prior right frontal craniotomy with aneurysm clipping. Electronically Signed   By: Ashley Royalty M.D.   On: 03/27/2016 16:53   Portable Chest 1 View  Result Date: 03/28/2016 CLINICAL DATA:  Hypoxia EXAM: PORTABLE CHEST 1 VIEW COMPARISON:  03/27/2016 FINDINGS: Progression of right lower lobe airspace disease and small right effusion. No change in left lower lobe airspace disease. Pulmonary vascular congestion. IMPRESSION: Progression of right lower lobe airspace disease and right pleural effusion. Pulmonary vascular congestion suggesting congestive heart failure. Electronically Signed   By: Franchot Gallo M.D.   On: 03/28/2016 07:35   Dg Chest Port 1 View  Result Date: 03/27/2016 CLINICAL DATA:  Hypoxia.  Sepsis.  Fever.  Altered mental status. EXAM: PORTABLE CHEST 1 VIEW COMPARISON:  One-view chest x-ray from the same day. FINDINGS: The heart is enlarged. There is progressive edema. Bilateral pleural effusions are suspected. Bibasilar airspace disease is new. Atherosclerotic calcifications are present at the aortic arch. Moderate osteopenia is noted. The visualized soft tissues and bony thorax are  unremarkable. IMPRESSION: 1. Interval development of moderate edema and bilateral effusions suggesting congestive heart failure. 2. Bibasilar airspace disease likely reflects atelectasis. 3. Stable cardiomegaly. 4. Aortic atherosclerosis. Electronically Signed   By: San Morelle M.D.   On: 03/27/2016 17:56   Dg Chest Port 1 View  Result Date: 03/27/2016 CLINICAL DATA:  Productive cough and shortness of breath. EXAM: PORTABLE CHEST 1 VIEW COMPARISON:  10/24/2015 FINDINGS: Mild enlargement of the cardiac silhouette is stable. Atherosclerotic calcifications at the aortic arch. Improved aeration at the left lung base compared to the previous examination. Prominent lung markings  at both lung bases may represent chronic changes. There is no focal airspace disease or pulmonary edema. Evidence for scarring at the lung apices. IMPRESSION: No acute chest findings. Stable mild cardiomegaly. Aortic atherosclerosis. Electronically Signed   By: Markus Daft M.D.   On: 03/27/2016 12:05        Scheduled Meds: . furosemide  40 mg Intravenous Q12H  . heparin  5,000 Units Subcutaneous Q8H  . ipratropium-albuterol  3 mL Nebulization TID  . methylPREDNISolone (SOLU-MEDROL) injection  125 mg Intravenous Q12H  . piperacillin-tazobactam (ZOSYN)  IV  3.375 g Intravenous Q8H  . vancomycin  750 mg Intravenous Q24H   Continuous Infusions:   LOS: 0 days    Time spent: 53min    Domenic Polite, MD [facility] Pager [phone]  If 7PM-7AM, please contact night-coverage [email or web address] Password [email or web address] 03/28/2016, 12:42 PM

## 2016-03-28 NOTE — Progress Notes (Signed)
Initial Nutrition Assessment  DOCUMENTATION CODES:   Not applicable  INTERVENTION:  Encourage adequate PO intake.   RD to order nutritional supplements if po intake becomes poor.  NUTRITION DIAGNOSIS:   Increased nutrient needs related to chronic illness as evidenced by estimated needs.  GOAL:   Patient will meet greater than or equal to 90% of their needs  MONITOR:   PO intake, Labs, Weight trends, Skin, I & O's  REASON FOR ASSESSMENT:   Low Braden    ASSESSMENT:   82 y.o. female with past medical history significant for aneurysm, congestive heart failure, high blood pressure, urinarytract infections presents emergency room with weakness.  Meal completion has been 75-90%. Pt reports appetite is fine. Pt reports usually consuming at least 3 meals daily. Pt does not consume nutritional supplements at home. Weight has been stable. Pt encouraged to eat her foods at meals.   Nutrition-Focused physical exam completed. Findings are no fat depletion, severe muscle depletion, and mild edema.   Labs and medications reviewed.   Diet Order:  Diet Heart Room service appropriate? Yes; Fluid consistency: Thin  Skin:  Reviewed, no issues  Last BM:  Unknown  Height:   Ht Readings from Last 1 Encounters:  10/21/15 5\' 7"  (1.702 m)    Weight:   Wt Readings from Last 1 Encounters:  03/27/16 136 lb (61.7 kg)    Ideal Body Weight:  61.36 kg  BMI:  Body mass index is 21.3 kg/m.  Estimated Nutritional Needs:   Kcal:  1500-1700  Protein:  65-75 grams  Fluid:  >/= 1.5 L/day  EDUCATION NEEDS:   No education needs identified at this time  Corrin Parker, MS, RD, LDN Pager # [phone] After hours/ weekend pager # [phone]

## 2016-03-28 NOTE — Progress Notes (Signed)
PHARMACY - PHYSICIAN COMMUNICATION CRITICAL VALUE ALERT - BLOOD CULTURE IDENTIFICATION (BCID)  Results for orders placed or performed during the hospital encounter of 03/27/16  Blood Culture ID Panel (Reflexed) (Collected: 03/27/2016 11:35 AM)  Result Value Ref Range   Enterococcus species NOT DETECTED NOT DETECTED   Listeria monocytogenes NOT DETECTED NOT DETECTED   Staphylococcus species DETECTED (A) NOT DETECTED   Staphylococcus aureus NOT DETECTED NOT DETECTED   Methicillin resistance NOT DETECTED NOT DETECTED   Streptococcus species NOT DETECTED NOT DETECTED   Streptococcus agalactiae NOT DETECTED NOT DETECTED   Streptococcus pneumoniae NOT DETECTED NOT DETECTED   Streptococcus pyogenes NOT DETECTED NOT DETECTED   Acinetobacter baumannii NOT DETECTED NOT DETECTED   Enterobacteriaceae species NOT DETECTED NOT DETECTED   Enterobacter cloacae complex NOT DETECTED NOT DETECTED   Escherichia coli NOT DETECTED NOT DETECTED   Klebsiella oxytoca NOT DETECTED NOT DETECTED   Klebsiella pneumoniae NOT DETECTED NOT DETECTED   Proteus species NOT DETECTED NOT DETECTED   Serratia marcescens NOT DETECTED NOT DETECTED   Haemophilus influenzae NOT DETECTED NOT DETECTED   Neisseria meningitidis NOT DETECTED NOT DETECTED   Pseudomonas aeruginosa NOT DETECTED NOT DETECTED   Candida albicans NOT DETECTED NOT DETECTED   Candida glabrata NOT DETECTED NOT DETECTED   Candida krusei NOT DETECTED NOT DETECTED   Candida parapsilosis NOT DETECTED NOT DETECTED   Candida tropicalis NOT DETECTED NOT DETECTED   82 year old female admitted with sepsis due to aspiration pneumonia. One of her sets of blood cultures is growing Staph species. This is likely a coagulase-negative Staph and may represent contamination. She is currently on Vancomycin and Zosyn for aspiration pneumonia coverage.  Name of physician (or Provider) Contacted: Dr. Broadus John  Changes to prescribed antibiotics required: Discussed with Dr.  Broadus John - could consider narrowing to Zosyn alone. Request made to continue Vancomycin x 1 more day to allow more time for clinical and micro data.  Legrand Como, Pharm.D., BCPS, AAHIVP Clinical Pharmacist Phone: [phone] or [phone] Pager: [phone] 03/28/2016, 11:05 AM

## 2016-03-28 NOTE — Care Management Obs Status (Signed)
[place] NOTIFICATION   Patient Details  Name: Sarah Perkins MRN: [id number] Date of Birth: 12-Mar-1918   Medicare Observation Status Notification Given:  Yes    Samantha Olivera, Rory Percy, RN 03/28/2016, 12:28 PM

## 2016-03-29 DIAGNOSIS — E876 Hypokalemia: Secondary | ICD-10-CM

## 2016-03-29 LAB — BASIC METABOLIC PANEL
ANION GAP: 11 (ref 5–15)
BUN: 26 mg/dL — ABNORMAL HIGH (ref 6–20)
CO2: 24 mmol/L (ref 22–32)
Calcium: 8.3 mg/dL — ABNORMAL LOW (ref 8.9–10.3)
Chloride: 108 mmol/L (ref 101–111)
Creatinine, Ser: 0.95 mg/dL (ref 0.44–1.00)
GFR calc Af Amer: 57 mL/min — ABNORMAL LOW (ref >60)
GFR, EST NON AFRICAN AMERICAN: 49 mL/min — AB (ref >60)
GLUCOSE: 141 mg/dL — AB (ref 65–99)
POTASSIUM: 2.9 mmol/L — AB (ref 3.5–5.1)
SODIUM: 143 mmol/L (ref 135–145)

## 2016-03-29 LAB — CBC
HEMATOCRIT: 24.9 % — AB (ref 36.0–46.0)
HEMOGLOBIN: 8.3 g/dL — AB (ref 12.0–15.0)
MCH: 29.3 pg (ref 26.0–34.0)
MCHC: 33.3 g/dL (ref 30.0–36.0)
MCV: 88 fL (ref 78.0–100.0)
Platelets: 197 10*3/uL (ref 150–400)
RBC: 2.83 MIL/uL — ABNORMAL LOW (ref 3.87–5.11)
RDW: 16.4 % — ABNORMAL HIGH (ref 11.5–15.5)
WBC: 10.6 10*3/uL — AB (ref 4.0–10.5)

## 2016-03-29 MED ORDER — POTASSIUM CHLORIDE CRYS ER 20 MEQ PO TBCR
40.0000 meq | EXTENDED_RELEASE_TABLET | Freq: Three times a day (TID) | ORAL | Status: DC
  Administered 2016-03-29 (×3): 40 meq via ORAL
  Filled 2016-03-29 (×5): qty 2

## 2016-03-29 NOTE — Consult Note (Signed)
   [facility] CM Inpatient Consult   03/29/2016  KEANI MERITT [birth year] [id number]   Patient screened for [place] Management program. Spoke with inpatient RNCM prior to visit. Went to room initially and MD was at bedside speaking with son. Therefore, did not engage at that time.   Went back to room and patient's son was not there. Spoke with patient's nurse. She indicates patient's son is the one to speak with regarding [place] [facility] program services. Ms. Tassin's nurse politely took writer's number to contact when son is back in the room. Also left [facility] Care Management packet at bedside along with contact information for son's review.  Will follow up at later time. Made inpatient RNCM aware of the above.    Marthenia Rolling, MSN-Ed, RN,BSN [facility] Liaison [phone]

## 2016-03-29 NOTE — Progress Notes (Signed)
PROGRESS NOTE    Sarah Perkins  [id number] DOB: 29-Sep-1917 DOA: 03/27/2016 PCP: Thressa Sheller, MD  Brief Narrative: Sarah Perkins is a 82 y.o. female with past medical history significant for Dementia, aneurysm, congestive heart failure, HTN, UTIs presented to the ED with weakness. Per patient's son patient vomited on Sunday, concerned that the patient aspirated. Patient indicates that she has vomited numerous times but is unable to specify how many times. Patient is aggressively become short of breath.  In ED, hypoxic, confused, CXR with Pneumonia and CHF  Assessment & Plan:   Principal Problem:   Sepsis/Aspiration pneumonia -h/o GERD, possible dysphagia from history -SLP eval completed: mild aspiration risk noted and D2 diet and thin liquids recommended -Stop Vancomycin and continue Zosyn today, change to PO levaquin tomorrow -FU Blood Cx with 1/2 coag negative staph which is consistent with contaminant   Acute on chronic diastolic CHF -improving with diuresis, continue IV lasix and KCL -ECHO with EF of 65%   Dementia -stable   Acute hypokalemia -replaced   Debilitated and bed bound  -since hip fracture earlier this year -Pt eval pending    Essential hypertension -stable, monitor  DVT prophylaxis:Hep SQ Code Status:discussed DNR with son, he is agreeable Family Communication:son at bedside Disposition Plan: Home when improved  Subjective: Breathing better  Objective: Vitals:   03/29/16 0500 03/29/16 0624 03/29/16 0754 03/29/16 0956  BP:  140/66  (!) 123/49  Pulse:  88 88 100  Resp:  16 16 18  Temp:  97.6 F (36.4 C)  98.3 F (36.8 C)  TempSrc:  Oral  Oral  SpO2:  97% 96% 97%  Weight:      Height: 5\' 7" (1.702 m)       Intake/Output Summary (Last 24 hours) at 03/29/16 1417 Last data filed at 03/29/16 1034  Gross per 24 hour  Intake              680 ml  Output                0 ml  Net              68 0 ml   Filed Weights   03/27/16 2035  Weight: 61.7 kg (136 lb)    Examination:  General exam: Appears calm and comfortable, oriented to self and place Respiratory system: ronchi at bases Cardiovascular system: S1 & S2 heard, RRR. Systolic murmur Gastrointestinal system: Abdomen is nondistended, soft and nontender.Normal bowel sounds heard. Central nervous system: Alert and oriented. No focal neurological deficits. Extremities: Symmetric 5 x 5 power. Skin: No rashes, lesions or ulcers Psychiatry: Judgement and insight appear normal. Mood & affect appropriate.     Data Reviewed: I have personally reviewed following labs and imaging studies  CBC:  Recent Labs Lab 03/27/16 1138 03/27/16 1806 03/28/16 0231 03/29/16 0437  WBC 19.5* 14.9* 13.7* 10.6*  NEUTROABS 13.8*  --   --   --   HGB 9.6* 7.7* 8.0* 8.3*  HCT 29.3* 23.5* 24.4* 24.9*  MCV 88.5 88.0 88.7 88.0  PLT 262 188 177 [id number]   Basic Metabolic Panel:  Recent Labs Lab 03/27/16 1138 03/27/16 1603 03/27/16 1806 03/28/16 0231 03/29/16 0436  NA 141  --   --  141 143  K 3.1*  --   --  4.0 2.9*  CL 108  --   --  114* 108  CO2 26  --   --  23 24  GLUCOSE 145*  --   --  154* 141*  BUN 24*  --   --  21* 26*  CREATININE 0.73  --  0.73 0.71 0.95  CALCIUM 9.1  --   --  8.3* 8.3*  MG  --  1.7  --   --   --   PHOS  --  2.2*  --   --   --    GFR: Estimated Creatinine Clearance: 33.7 mL/min (by C-G formula based on SCr of 0.95 mg/dL). Liver Function Tests:  Recent Labs Lab 03/27/16 1138  AST 21  ALT 9*  ALKPHOS 44  BILITOT 0.7  PROT 8.0  ALBUMIN 2.8*   No results for input(s): LIPASE, AMYLASE in the last 168 hours. No results for input(s): AMMONIA in the last 168 hours. Coagulation Profile:  Recent Labs Lab 03/27/16 1138  INR 1.25   Cardiac Enzymes:  Recent Labs Lab 03/27/16 1138 03/27/16 1603 03/27/16 2055 03/28/16 0231  TROPONINI 0.03* 0.04* 0.03* 0.04*   BNP (last 3 results) No results for input(s): PROBNP in the last  8760 hours. HbA1C: No results for input(s): HGBA1C in the last 72 hours. CBG: No results for input(s): GLUCAP in the last 168 hours. Lipid Profile: No results for input(s): CHOL, HDL, LDLCALC, TRIG, CHOLHDL, LDLDIRECT in the last 72 hours. Thyroid Function Tests: No results for input(s): TSH, T4TOTAL, FREET4, T3FREE, THYROIDAB in the last 72 hours. Anemia Panel: No results for input(s): VITAMINB12, FOLATE, FERRITIN, TIBC, IRON, RETICCTPCT in the last 72 hours. Urine analysis:    Component Value Date/Time   COLORURINE YELLOW 03/27/2016 1308   APPEARANCEUR HAZY (A) 03/27/2016 1308   LABSPEC 1.020 03/27/2016 1308   PHURINE 6.5 03/27/2016 1308   GLUCOSEU NEGATIVE 03/27/2016 1308   HGBUR NEGATIVE 03/27/2016 1308   BILIRUBINUR NEGATIVE 03/27/2016 1308   KETONESUR NEGATIVE 03/27/2016 1308   PROTEINUR 30 (A) 03/27/2016 1308   UROBILINOGEN 0.2 10/08/2014 1225   NITRITE POSITIVE (A) 03/27/2016 1308   LEUKOCYTESUR MODERATE (A) 03/27/2016 1308   Sepsis Labs: @LABRCNTIP (procalcitonin:4,lacticidven:4)  ) Recent Results (from the past 240 hour(s))  Blood Culture (routine x 2)     Status: Abnormal (Preliminary result)   Collection Time: 03/27/16 11:35 AM  Result Value Ref Range Status   Specimen Description BLOOD LEFT FOREARM  Final   Special Requests BOTTLES DRAWN AEROBIC AND ANAEROBIC  5CC  Final   Culture  Setup Time   Final    GRAM POSITIVE COCCI IN CLUSTERS IN BOTH AEROBIC AND ANAEROBIC BOTTLES CRITICAL RESULT CALLED TO, READ BACK BY AND VERIFIED WITH: M TURNER 03/28/16 @ 21 M VESTAL    Culture (A)  Final    STAPHYLOCOCCUS SPECIES (COAGULASE NEGATIVE) THE SIGNIFICANCE OF ISOLATING THIS ORGANISM FROM A SINGLE VENIPUNCTURE CANNOT BE PREDICTED WITHOUT FURTHER CLINICAL AND CULTURE CORRELATION. SUSCEPTIBILITIES AVAILABLE ONLY ON REQUEST.    Report Status PENDING  Incomplete  Blood Culture ID Panel (Reflexed)     Status: Abnormal   Collection Time: 03/27/16 11:35 AM  Result Value  Ref Range Status   Enterococcus species NOT DETECTED NOT DETECTED Final   Listeria monocytogenes NOT DETECTED NOT DETECTED Final   Staphylococcus species DETECTED (A) NOT DETECTED Final    Comment: CRITICAL RESULT CALLED TO, READ BACK BY AND VERIFIED WITH: M TURNER 03/28/16 @ 67 M VESTAL    Staphylococcus aureus NOT DETECTED NOT DETECTED Final   Methicillin resistance NOT DETECTED NOT DETECTED Final   Streptococcus species NOT DETECTED NOT DETECTED Final   Streptococcus agalactiae NOT DETECTED NOT DETECTED Final   Streptococcus  pneumoniae NOT DETECTED NOT DETECTED Final   Streptococcus pyogenes NOT DETECTED NOT DETECTED Final   Acinetobacter baumannii NOT DETECTED NOT DETECTED Final   Enterobacteriaceae species NOT DETECTED NOT DETECTED Final   Enterobacter cloacae complex NOT DETECTED NOT DETECTED Final   Escherichia coli NOT DETECTED NOT DETECTED Final   Klebsiella oxytoca NOT DETECTED NOT DETECTED Final   Klebsiella pneumoniae NOT DETECTED NOT DETECTED Final   Proteus species NOT DETECTED NOT DETECTED Final   Serratia marcescens NOT DETECTED NOT DETECTED Final   Haemophilus influenzae NOT DETECTED NOT DETECTED Final   Neisseria meningitidis NOT DETECTED NOT DETECTED Final   Pseudomonas aeruginosa NOT DETECTED NOT DETECTED Final   Candida albicans NOT DETECTED NOT DETECTED Final   Candida glabrata NOT DETECTED NOT DETECTED Final   Candida krusei NOT DETECTED NOT DETECTED Final   Candida parapsilosis NOT DETECTED NOT DETECTED Final   Candida tropicalis NOT DETECTED NOT DETECTED Final  Blood Culture (routine x 2)     Status: None (Preliminary result)   Collection Time: 03/27/16 12:30 PM  Result Value Ref Range Status   Specimen Description BLOOD RIGHT ANTECUBITAL  Final   Special Requests BOTTLES DRAWN AEROBIC AND ANAEROBIC 5 ML  Final   Culture NO GROWTH 2 DAYS  Final   Report Status PENDING  Incomplete  Urine culture     Status: Abnormal   Collection Time: 03/27/16  1:08 PM    Result Value Ref Range Status   Specimen Description URINE, RANDOM  Final   Special Requests NONE  Final   Culture MULTIPLE SPECIES PRESENT, SUGGEST RECOLLECTION (A)  Final   Report Status 03/28/2016 FINAL  Final  MRSA PCR Screening     Status: None   Collection Time: 03/28/16 12:20 PM  Result Value Ref Range Status   MRSA by PCR NEGATIVE NEGATIVE Final    Comment:        The GeneXpert MRSA Assay (FDA approved for NASAL specimens only), is one component of a comprehensive MRSA colonization surveillance program. It is not intended to diagnose MRSA infection nor to guide or monitor treatment for MRSA infections.          Radiology Studies: Ct Head Wo Contrast  Result Date: 03/27/2016 CLINICAL DATA:  Hypoxia. EXAM: CT HEAD WITHOUT CONTRAST TECHNIQUE: Contiguous axial images were obtained from the base of the skull through the vertex without intravenous contrast. COMPARISON:  10/01/2015 FINDINGS: BRAIN: There is sulcal and ventricular prominence consistent with superficial and central atrophy. No intraparenchymal hemorrhage, mass effect nor midline shift. There are extensive periventricular and subcortical white matter hypodensities consistent with chronic small vessel ischemic disease. No acute large vascular territory infarcts. No abnormal extra-axial fluid collections. Basal cisterns are patent. VASCULAR: Moderate calcific atherosclerosis of the carotid siphons. Beam hardening artifacts along the anterior skull base from aneurysm clips as before. SKULL: No skull fracture. Prior right frontal craniotomy. No significant scalp soft tissue swelling. SINUSES/ORBITS: The mastoid air-cells are clear. The included paranasal sinuses are well-aerated.The included ocular globes and orbital contents are non-suspicious. Bilateral lens replacement surgical change. OTHER: None. IMPRESSION: Chronic central and superficial atrophy with moderate to marked chronic small vessel ischemic disease of  periventricular and subcortical white matter. No acute intracranial hemorrhage, acute large vascular territory infarction, mass or midline shift. Prior right frontal craniotomy with aneurysm clipping. Electronically Signed   By: Ashley Royalty M.D.   On: 03/27/2016 16:53   Portable Chest 1 View  Result Date: 03/28/2016 CLINICAL DATA:  Hypoxia EXAM: PORTABLE CHEST  1 VIEW COMPARISON:  03/27/2016 FINDINGS: Progression of right lower lobe airspace disease and small right effusion. No change in left lower lobe airspace disease. Pulmonary vascular congestion. IMPRESSION: Progression of right lower lobe airspace disease and right pleural effusion. Pulmonary vascular congestion suggesting congestive heart failure. Electronically Signed   By: Franchot Gallo M.D.   On: 03/28/2016 07:35   Dg Chest Port 1 View  Result Date: 03/27/2016 CLINICAL DATA:  Hypoxia.  Sepsis.  Fever.  Altered mental status. EXAM: PORTABLE CHEST 1 VIEW COMPARISON:  One-view chest x-ray from the same day. FINDINGS: The heart is enlarged. There is progressive edema. Bilateral pleural effusions are suspected. Bibasilar airspace disease is new. Atherosclerotic calcifications are present at the aortic arch. Moderate osteopenia is noted. The visualized soft tissues and bony thorax are unremarkable. IMPRESSION: 1. Interval development of moderate edema and bilateral effusions suggesting congestive heart failure. 2. Bibasilar airspace disease likely reflects atelectasis. 3. Stable cardiomegaly. 4. Aortic atherosclerosis. Electronically Signed   By: San Morelle M.D.   On: 03/27/2016 17:56        Scheduled Meds: . furosemide  40 mg Intravenous Q12H  . heparin  5,000 Units Subcutaneous Q8H  . ipratropium-albuterol  3 mL Nebulization TID  . piperacillin-tazobactam (ZOSYN)  IV  3.375 g Intravenous Q8H  . potassium chloride  40 mEq Oral TID   Continuous Infusions:   LOS: 1 day    Time spent: 13min    Domenic Polite, MD [facility] Pager [phone]  If 7PM-7AM, please contact night-coverage [email or web address] Password [email or web address] 03/29/2016, 2:17 PM

## 2016-03-29 NOTE — Progress Notes (Signed)
Pharmacy Antibiotic Note  Sarah Perkins is a 82 y.o. female admitted on 03/27/2016 with pneumonia.  Pharmacy has been consulted for zosyn dosing for suspected aspiration PNA.   WBC trending down, cultures are negative to date. SCr increased to 0.95, patient bed-bound at baseline per chart so will continue to watch closely. eCrCl ~30-81mL/min.  Plan: Zosyn 3.375 mg IV q8hr Follow c/s, renal function, clinical progression, LOT  Height: 5\' 7"  (170.2 cm) Weight: 136 lb (61.7 kg) IBW/kg (Calculated) : 61.6   Temp (24hrs), Avg:98.4 F (36.9 C), Min:97.6 F (36.4 C), Max:99.6 F (37.6 C)   Recent Labs Lab 03/27/16 1138 03/27/16 1157 03/27/16 1603 03/27/16 1621 03/27/16 1806 03/28/16 0231 03/29/16 0436 03/29/16 0437  WBC 19.5*  --   --   --  14.9* 13.7*  --  10.6*  CREATININE 0.73  --   --   --  0.73 0.71 0.95  --   LATICACIDVEN  --  1.86 1.7 1.74 1.5  --   --   --     Estimated Creatinine Clearance: 33.7 mL/min (by C-G formula based on SCr of 0.95 mg/dL).    No Known Allergies   Antimicrobials this admission:  CTX 11/29 x1 Azith 11/29 x1 Zosyn 11/29>> Vanc 11/29>>12/1  Dose adjustments this admission:  N/a  Microbiology results:  11/29 BCx: 1/2 CoNS 11/30 BCID: staph species 11/29 UCx: mult species  11/30 MRSA PCR: neg  Seyed Heffley D. Collins Kerby, PharmD, BCPS Clinical Pharmacist Pager: [phone] 03/29/2016 11:17 AM

## 2016-03-29 NOTE — Evaluation (Signed)
Clinical/Bedside Swallow Evaluation Patient Details  Name: Sarah Perkins MRN: [id number] Date of Birth: 21-Aug-1917  Today's Date: 03/29/2016 Time: SLP Start Time (ACUTE ONLY): 1008 SLP Stop Time (ACUTE ONLY): 1019 SLP Time Calculation (min) (ACUTE ONLY): 11 min  Past Medical History:  Past Medical History:  Diagnosis Date  . Acid reflux    occasional  . Acute blood loss anemia 06/2009   received ~ 6 units blood  . Adenomatous polyp of colon 06/2009  . Aneurysm ([place]) 1972   cerebral  . CHF (congestive heart failure) ([place])   . Diverticulitis   . GI bleed 02/14/2014  . History of GI diverticular bleed 06/2009   colonoscopy with endo clipping of bleeding tic by Dr Clarene Essex.  Flex sig by Dr Paulita Fujita.  . Hypertension   . Tremor    worse on right arm  . UTI (lower urinary tract infection)    Past Surgical History:  Past Surgical History:  Procedure Laterality Date  . CEREBRAL ANEURYSM REPAIR  1970's  . COLONOSCOPY  09/22/2011   Procedure: COLONOSCOPY;  Surgeon: Juanita Craver, MD;  Location: [facility] ENDOSCOPY;  Service: Endoscopy;  Laterality: N/A;  wants ped scope  . COLONOSCOPY W/ CONTROL OF HEMORRHAGE  06/2009  . HIP FRACTURE SURGERY  2011  . KNEE ARTHROSCOPY    . ORIF FEMUR FRACTURE Right 10/22/2015   Procedure: OPEN REDUCTION INTERNAL FIXATION (ORIF) DISTAL FEMUR FRACTURE;  Surgeon: Mcarthur Rossetti, MD;  Location: [facility];  Service: Orthopedics;  Laterality: Right;   HPI:  Sarah Nicklow Richardsonis a 82 y.o.femalewith past medical history significant for Dementia, aneurysm, congestive heart failure, HTN, UTIs presented to the ED with weakness.   Assessment / Plan / Recommendation Clinical Impression  Pt referred for clinical assessment of swallow function. No s/s aspiration noted at bedside with any consistency trialed. Pt did have prolonged mastication with solids, likely due to missing dentition. Pt's primary risk for aspiration is secondary to reflux and dementia.  However, with reflux precautions in place, risk will be reduced. Discussed with pt's son. Recommend dysphagia 2 diet with chopped meats, thin liquids, meds whole with puree, intermittent supervision- ensure pt is seated as upright 90 degrees during meals, encourage small bites/ sips, have pt sit upright 30-60 minutes after meal. SLP will sign off at this time- please re-consult if needs arise.    Aspiration Risk  Mild aspiration risk    Diet Recommendation Dysphagia 2 (Fine chop)   Liquid Administration via: Cup;Straw Medication Administration: Whole meds with liquid (or puree) Supervision: Staff to assist with self feeding Compensations: Slow rate;Small sips/bites Postural Changes: Seated upright at 90 degrees;Remain upright for at least 30 minutes after po intake    Other  Recommendations Oral Care Recommendations: Oral care BID   Follow up Recommendations 24 hour supervision/assistance      Frequency and Duration            Prognosis Prognosis for Safe Diet Advancement: Good      Swallow Study   General Date of Onset: 03/27/16 HPI: Sarah Alcantara Richardsonis a 82 y.o.femalewith past medical history significant for Dementia, aneurysm, congestive heart failure, HTN, UTIs presented to the ED with weakness. Type of Study: Bedside Swallow Evaluation Previous Swallow Assessment: BSE- 2016 Diet Prior to this Study: Regular;Thin liquids Temperature Spikes Noted: No Respiratory Status: Nasal cannula History of Recent Intubation: No Behavior/Cognition: Alert;Cooperative;Pleasant mood;Confused Oral Cavity Assessment: Within Functional Limits Oral Cavity - Dentition: Poor condition;Missing dentition Vision: Functional for self-feeding Self-Feeding Abilities: Needs  assist Patient Positioning: Upright in bed Baseline Vocal Quality: Low vocal intensity Volitional Cough:  (Fair) Volitional Swallow: Able to elicit    Oral/Motor/Sensory Function Overall Oral Motor/Sensory Function:  Within functional limits   Ice Chips Ice chips: Within functional limits   Thin Liquid Thin Liquid: Within functional limits Presentation: Self Fed;Spoon;Straw;Cup    Nectar Thick Nectar Thick Liquid: Not tested   Honey Thick Honey Thick Liquid: Not tested   Puree Puree: Within functional limits Presentation: Self Fed;Spoon   Solid   GO   Solid: Impaired Presentation: Self Fed Oral Phase Impairments: Impaired mastication Oral Phase Functional Implications: Prolonged oral transit;Impaired mastication    Functional Assessment Tool Used: clinician judgement Functional Limitations: Swallowing Swallow Current Status [id number]): At least 20 percent but less than 40 percent impaired, limited or restricted Swallow Goal Status [phone]): At least 20 percent but less than 40 percent impaired, limited or restricted Swallow Discharge Status [phone]): At least 20 percent but less than 40 percent impaired, limited or restricted   Vinetta Bergamo MA, [place] Pager [phone] 03/29/2016,10:36 AM

## 2016-03-29 NOTE — Care Management Note (Signed)
Case Management Note  Patient Details  Name: Sarah Perkins MRN: [id number] Date of Birth: June 15, 1917  Subjective/Objective:        CM following for progression and d/c planning .            Action/Plan: 03/29/2016 Met with pt and son, pt unable to assist with planning. Per son this pt was recently d/c from SNF as she has used most of her days. She is now cared for at home by her son and [facility]. [facility] aware of pt admission and will resume services .   Expected Discharge Date:                  Expected Discharge Plan:  [place]  In-House Referral:  NA  Discharge planning Services  CM Consult  Post Acute Care Choice:  Home Health Choice offered to:  Adult Children  DME Arranged:  N/A DME Agency:  NA  HH Arranged:  RN, PT HH Agency:  [facility] (now [facility])  Status of Service:  Completed, signed off  If discussed at [place] of Stay Meetings, dates discussed:    Additional Comments:  Adron Bene, RN 03/29/2016, 3:03 PM

## 2016-03-29 NOTE — Consult Note (Signed)
   [facility] CM Inpatient Consult   03/29/2016  KALAYLA SACCONE [birth year] [id number]     [facility] Care Management follow up. Telephone call made into patient's room. Spoke with son who pleasantly declines [facility] Care Management services. States patient has [place] home health. He also states if his mother has SNF days left, he wants her to go to [facility] SNF again. Nonetheless, he declines [facility] Care Management at this time. Made him aware that [facility] Management packet is at bedside with contact information for him to call if he should change his mind. Will make inpatient RNCM aware.  Marthenia Rolling, MSN-Ed, RN,BSN [facility] Liaison [phone]

## 2016-03-30 LAB — CULTURE, BLOOD (ROUTINE X 2)

## 2016-03-30 LAB — CBC
HCT: 27 % — ABNORMAL LOW (ref 36.0–46.0)
HEMOGLOBIN: 8.7 g/dL — AB (ref 12.0–15.0)
MCH: 28.7 pg (ref 26.0–34.0)
MCHC: 32.2 g/dL (ref 30.0–36.0)
MCV: 89.1 fL (ref 78.0–100.0)
Platelets: 232 10*3/uL (ref 150–400)
RBC: 3.03 MIL/uL — AB (ref 3.87–5.11)
RDW: 16.6 % — ABNORMAL HIGH (ref 11.5–15.5)
WBC: 7.6 10*3/uL (ref 4.0–10.5)

## 2016-03-30 LAB — BASIC METABOLIC PANEL
ANION GAP: 9 (ref 5–15)
BUN: 23 mg/dL — ABNORMAL HIGH (ref 6–20)
CHLORIDE: 103 mmol/L (ref 101–111)
CO2: 29 mmol/L (ref 22–32)
Calcium: 8.4 mg/dL — ABNORMAL LOW (ref 8.9–10.3)
Creatinine, Ser: 0.8 mg/dL (ref 0.44–1.00)
GFR calc non Af Amer: >60 mL/min (ref >60)
Glucose, Bld: 88 mg/dL (ref 65–99)
Potassium: 3.7 mmol/L (ref 3.5–5.1)
Sodium: 141 mmol/L (ref 135–145)

## 2016-03-30 MED ORDER — FUROSEMIDE 40 MG PO TABS
40.0000 mg | ORAL_TABLET | Freq: Every day | ORAL | Status: DC
Start: 2016-03-30 — End: 2016-03-31
  Administered 2016-03-30 – 2016-03-31 (×2): 40 mg via ORAL
  Filled 2016-03-30: qty 1

## 2016-03-30 MED ORDER — POTASSIUM CHLORIDE CRYS ER 20 MEQ PO TBCR
40.0000 meq | EXTENDED_RELEASE_TABLET | Freq: Two times a day (BID) | ORAL | Status: DC
  Administered 2016-03-30 – 2016-03-31 (×3): 40 meq via ORAL
  Filled 2016-03-30: qty 2

## 2016-03-30 MED ORDER — LEVOFLOXACIN 500 MG PO TABS
ORAL_TABLET | ORAL | Status: AC
  Filled 2016-03-30: qty 1

## 2016-03-30 MED ORDER — LEVOFLOXACIN 500 MG PO TABS
250.0000 mg | ORAL_TABLET | Freq: Every day | ORAL | Status: DC
  Administered 2016-03-31: 250 mg via ORAL
  Filled 2016-03-30: qty 1

## 2016-03-30 MED ORDER — LEVOFLOXACIN 500 MG PO TABS
500.0000 mg | ORAL_TABLET | Freq: Every day | ORAL | Status: DC
  Administered 2016-03-30: 500 mg via ORAL

## 2016-03-30 MED ORDER — FUROSEMIDE 40 MG PO TABS
ORAL_TABLET | ORAL | Status: AC
  Filled 2016-03-30: qty 1

## 2016-03-30 NOTE — Evaluation (Signed)
Physical Therapy Evaluation Patient Details Name: Sarah Perkins MRN: [id number] DOB: 09/06/1917 Today's Date: 03/30/2016   History of Present Illness  Patient is a 82 yo female admitted 03/27/16 with SOB and increased confusion.  Patient with acute respiratory failure with hypoxia, CHF, pna, hypokalemia.   PMH:  dementia, CHF, HTN, hip fx s/p ORIF on RLE in June 2017 with SNF stay post-op.    Clinical Impression  Patient presents with problems listed below.  Will benefit from acute PT to maximize functional mobility prior to discharge.  Recommend SNF at d/c for continued therapy prior to return home with son.    Follow Up Recommendations SNF;Supervision/Assistance - 24 hour    Equipment Recommendations  None recommended by PT    Recommendations for Other Services       Precautions / Restrictions Precautions Precautions: Fall Restrictions Weight Bearing Restrictions: No      Mobility  Bed Mobility Overal bed mobility: Needs Assistance;+2 for physical assistance Bed Mobility: Supine to Sit;Sit to Supine     Supine to sit: Max assist;Total assist;+2 for physical assistance Sit to supine: Total assist;+2 for physical assistance   General bed mobility comments: Verbal cues for patient to move LE's toward EOB.  Requiring increased time to initiate minimal movement.  Patient moved to EOB with +2 total assist using bed pads.  Once sitting, cues to shift weight forward due to posterior lean.   Required +2 total assist to return to supine and scoot toward HOB at end of session.  Transfers Overall transfer level: Needs assistance Equipment used: 2 person hand held assist Transfers: Sit to/from Stand Sit to Stand: Max assist;+2 physical assistance         General transfer comment: Verbal cues for hand placement.  Patient very hesitant to attempt standing, stating "wait, wait".  Difficulty getting feet underneath her due to decreased knee flexion.  Required +2 max assist to  move to standing.  Unable to reach fully upright position.  Noted patient had had BM.  Soiled pad removed and patient returned to supine to be cleaned.  Ambulation/Gait             General Gait Details: NT  Stairs            Wheelchair Mobility    Modified Rankin (Stroke Patients Only)       Balance Overall balance assessment: Needs assistance Sitting-balance support: Bilateral upper extremity supported;Feet supported Sitting balance-Leahy Scale: Poor   Postural control: Posterior lean Standing balance support: Bilateral upper extremity supported;During functional activity Standing balance-Leahy Scale: Zero Standing balance comment: Required +2 max assist to maintain stance.                             Pertinent Vitals/Pain Pain Assessment: Faces Faces Pain Scale: Hurts little more Pain Location: RLE with movement Pain Descriptors / Indicators: Grimacing;Guarding Pain Intervention(s): Monitored during session;Repositioned    Home Living Family/patient expects to be discharged to:: Skilled nursing facility Living Arrangements: Children (Son)             Home Equipment: Gilford Rile - 2 wheels;Wheelchair - manual (Geophysicist/field seismologist) Additional Comments: Pt has been living with son who was providing 24 hour assist     Prior Function Level of Independence: Needs assistance   Gait / Transfers Assistance Needed: Min ambulation with RW and close min assist.  Primarily uses w/c.  Son reports max assist to transfer.  Has "hoyer lift" but  doesn't use it.  ADL's / Homemaking Assistance Needed: son provides A for bathing and dressing and he performs all homemaking tasks, pt can self feed with set up          Hand Dominance   Dominant Hand: Right    Extremity/Trunk Assessment   Upper Extremity Assessment: Generalized weakness           Lower Extremity Assessment: Generalized weakness;RLE deficits/detail;LLE deficits/detail RLE Deficits / Details:  Strength grossly 3+/5; Decreased knee flexion, < 90* LLE Deficits / Details: Strength grossly 3+/5; Decreased knee flexion, < 90*  Cervical / Trunk Assessment: Kyphotic  Communication   Communication: HOH  Cognition Arousal/Alertness: Awake/alert Behavior During Therapy: Anxious;Flat affect (Starting to be combative) Overall Cognitive Status: Impaired/Different from baseline Area of Impairment: Orientation;Memory;Following commands;Safety/judgement;Awareness;Problem solving Orientation Level: Disoriented to;Situation;Time   Memory: Decreased short-term memory Following Commands: Follows one step commands with increased time;Follows one step commands inconsistently Safety/Judgement: Decreased awareness of deficits;Decreased awareness of safety   Problem Solving: Slow processing;Decreased initiation;Difficulty sequencing;Requires verbal cues;Requires tactile cues      General Comments      Exercises     Assessment/Plan    PT Assessment Patient needs continued PT services  PT Problem List Decreased strength;Decreased range of motion;Decreased activity tolerance;Decreased balance;Decreased mobility;Decreased coordination;Decreased knowledge of use of DME;Decreased cognition;Decreased safety awareness;Cardiopulmonary status limiting activity;Pain          PT Treatment Interventions DME instruction;Gait training;Functional mobility training;Therapeutic activities;Balance training;Cognitive remediation;Patient/family education    PT Goals (Current goals can be found in the Care Plan section)  Acute Rehab PT Goals Patient Stated Goal: None stated PT Goal Formulation: With patient/family Time For Goal Achievement: 04/13/16 Potential to Achieve Goals: Fair    Frequency Min 2X/week   Barriers to discharge        Co-evaluation               End of Session Equipment Utilized During Treatment: Gait belt;Oxygen Activity Tolerance: Patient limited by fatigue;Patient limited  by pain Patient left: in bed;with call bell/phone within reach;with bed alarm set Nurse Communication: Mobility status (Needs to be cleaned)         Time: [id number] PT Time Calculation (min) (ACUTE ONLY): 17 min   Charges:   PT Evaluation $PT Eval Moderate Complexity: 1 Procedure     PT G Codes:        Despina Pole 2016/04/16, 3:05 PM Carita Pian. Sanjuana Kava, [place] Pager [phone]

## 2016-03-30 NOTE — Progress Notes (Signed)
PROGRESS NOTE    Sarah Perkins  [id number] DOB: 02/20/1918 DOA: 03/27/2016 PCP: Thressa Sheller, MD  Brief Narrative: Sarah Perkins is a 82 y.o. female with past medical history significant for Dementia, aneurysm, congestive heart failure, HTN, UTIs presented to the ED with weakness. Per patient's son patient vomited on Sunday, concerned that the patient aspirated. Patient indicates that she has vomited numerous times but is unable to specify how many times. Patient is aggressively become short of breath.  In ED, hypoxic, confused, CXR with Pneumonia and CHF  Assessment & Plan:     Sepsis/Aspiration pneumonia -h/o GERD, possible dysphagia from history -SLP eval completed: mild aspiration risk noted and D2 diet and thin liquids recommended -Stop Vancomycin and Zosyn today,  -change to PO levaquin today -FU Blood Cx with 1/2 coag negative staph which is consistent with contaminant   Acute on chronic diastolic CHF -improving with diuresis, improving on IV lasix and KCl -change to PO lasix -ECHO with EF of 65%   Dementia -stable   Acute hypokalemia -replaced   Debilitated and bed bound  -since hip fracture earlier this year -Pt eval pending    Essential hypertension -stable, monitor  DVT prophylaxis:Hep SQ Code Status:discussed DNR with son, he is agreeable Family Communication:son at bedside Disposition Plan: Home tomorrow  Subjective: Breathing better  Objective: Vitals:   03/29/16 2040 03/30/16 0547 03/30/16 0932 03/30/16 0948  BP: (!) 145/65 (!) 160/80  (!) 128/48  Pulse: 88 83  97  Resp: 16 16  18  Temp: 98.3 F (36.8 C) 98.6 F (37 C)  97.8 F (36.6 C)  TempSrc:    Oral  SpO2: 96% 94% 97% 99%  Weight:      Height:        Intake/Output Summary (Last 24 hours) at 03/30/16 1450 Last data filed at 03/30/16 1432  Gross per 24 hour  Intake              820 ml  Output                0 ml  Net              82 0 ml   Filed Weights   03/27/16 2035  Weight: 61.7 kg (136 lb)    Examination:  General exam: Appears calm and comfortable, oriented to self and place Respiratory system: ronchi at bases Cardiovascular system: S1 & S2 heard, RRR. Systolic murmur Gastrointestinal system: Abdomen is nondistended, soft and nontender.Normal bowel sounds heard. Central nervous system: Alert and oriented. No focal neurological deficits. Extremities: Symmetric 5 x 5 power. Skin: No rashes, lesions or ulcers Psychiatry: Judgement and insight appear normal. Mood & affect appropriate.     Data Reviewed: I have personally reviewed following labs and imaging studies  CBC:  Recent Labs Lab 03/27/16 1138 03/27/16 1806 03/28/16 0231 03/29/16 0437 03/30/16 0430  WBC 19.5* 14.9* 13.7* 10.6* 7.6  NEUTROABS 13.8*  --   --   --   --   HGB 9.6* 7.7* 8.0* 8.3* 8.7*  HCT 29.3* 23.5* 24.4* 24.9* 27.0*  MCV 88.5 88.0 88.7 88.0 89.1  PLT 262 188 177 197 [id number]   Basic Metabolic Panel:  Recent Labs Lab 03/27/16 1138 03/27/16 1603 03/27/16 1806 03/28/16 0231 03/29/16 0436 03/30/16 0430  NA 141  --   --  141 143 141  K 3.1*  --   --  4.0 2.9* 3.7  CL 108  --   --  114* 108 103  CO2 26  --   --  23 24 29   GLUCOSE 145*  --   --  154* 141* 88  BUN 24*  --   --  21* 26* 23*  CREATININE 0.73  --  0.73 0.71 0.95 0.80  CALCIUM 9.1  --   --  8.3* 8.3* 8.4*  MG  --  1.7  --   --   --   --   PHOS  --  2.2*  --   --   --   --    GFR: Estimated Creatinine Clearance: 40 mL/min (by C-G formula based on SCr of 0.8 mg/dL). Liver Function Tests:  Recent Labs Lab 03/27/16 1138  AST 21  ALT 9*  ALKPHOS 44  BILITOT 0.7  PROT 8.0  ALBUMIN 2.8*   No results for input(s): LIPASE, AMYLASE in the last 168 hours. No results for input(s): AMMONIA in the last 168 hours. Coagulation Profile:  Recent Labs Lab 03/27/16 1138  INR 1.25   Cardiac Enzymes:  Recent Labs Lab 03/27/16 1138 03/27/16 1603 03/27/16 2055 03/28/16 0231    TROPONINI 0.03* 0.04* 0.03* 0.04*   BNP (last 3 results) No results for input(s): PROBNP in the last 8760 hours. HbA1C: No results for input(s): HGBA1C in the last 72 hours. CBG: No results for input(s): GLUCAP in the last 168 hours. Lipid Profile: No results for input(s): CHOL, HDL, LDLCALC, TRIG, CHOLHDL, LDLDIRECT in the last 72 hours. Thyroid Function Tests: No results for input(s): TSH, T4TOTAL, FREET4, T3FREE, THYROIDAB in the last 72 hours. Anemia Panel: No results for input(s): VITAMINB12, FOLATE, FERRITIN, TIBC, IRON, RETICCTPCT in the last 72 hours. Urine analysis:    Component Value Date/Time   COLORURINE YELLOW 03/27/2016 1308   APPEARANCEUR HAZY (A) 03/27/2016 1308   LABSPEC 1.020 03/27/2016 1308   PHURINE 6.5 03/27/2016 1308   GLUCOSEU NEGATIVE 03/27/2016 1308   HGBUR NEGATIVE 03/27/2016 1308   BILIRUBINUR NEGATIVE 03/27/2016 1308   KETONESUR NEGATIVE 03/27/2016 1308   PROTEINUR 30 (A) 03/27/2016 1308   UROBILINOGEN 0.2 10/08/2014 1225   NITRITE POSITIVE (A) 03/27/2016 1308   LEUKOCYTESUR MODERATE (A) 03/27/2016 1308   Sepsis Labs: @LABRCNTIP (procalcitonin:4,lacticidven:4)  ) Recent Results (from the past 240 hour(s))  Blood Culture (routine x 2)     Status: Abnormal   Collection Time: 03/27/16 11:35 AM  Result Value Ref Range Status   Specimen Description BLOOD LEFT FOREARM  Final   Special Requests BOTTLES DRAWN AEROBIC AND ANAEROBIC  5CC  Final   Culture  Setup Time   Final    GRAM POSITIVE COCCI IN CLUSTERS IN BOTH AEROBIC AND ANAEROBIC BOTTLES CRITICAL RESULT CALLED TO, READ BACK BY AND VERIFIED WITH: M TURNER 03/28/16 @ 54 M VESTAL    Culture (A)  Final    STAPHYLOCOCCUS SPECIES (COAGULASE NEGATIVE) THE SIGNIFICANCE OF ISOLATING THIS ORGANISM FROM A SINGLE VENIPUNCTURE CANNOT BE PREDICTED WITHOUT FURTHER CLINICAL AND CULTURE CORRELATION. SUSCEPTIBILITIES AVAILABLE ONLY ON REQUEST.    Report Status 03/30/2016 FINAL  Final  Blood Culture ID  Panel (Reflexed)     Status: Abnormal   Collection Time: 03/27/16 11:35 AM  Result Value Ref Range Status   Enterococcus species NOT DETECTED NOT DETECTED Final   Listeria monocytogenes NOT DETECTED NOT DETECTED Final   Staphylococcus species DETECTED (A) NOT DETECTED Final    Comment: CRITICAL RESULT CALLED TO, READ BACK BY AND VERIFIED WITH: M TURNER 03/28/16 @ 0908 M VESTAL    Staphylococcus aureus NOT DETECTED NOT DETECTED Final  Methicillin resistance NOT DETECTED NOT DETECTED Final   Streptococcus species NOT DETECTED NOT DETECTED Final   Streptococcus agalactiae NOT DETECTED NOT DETECTED Final   Streptococcus pneumoniae NOT DETECTED NOT DETECTED Final   Streptococcus pyogenes NOT DETECTED NOT DETECTED Final   Acinetobacter baumannii NOT DETECTED NOT DETECTED Final   Enterobacteriaceae species NOT DETECTED NOT DETECTED Final   Enterobacter cloacae complex NOT DETECTED NOT DETECTED Final   Escherichia coli NOT DETECTED NOT DETECTED Final   Klebsiella oxytoca NOT DETECTED NOT DETECTED Final   Klebsiella pneumoniae NOT DETECTED NOT DETECTED Final   Proteus species NOT DETECTED NOT DETECTED Final   Serratia marcescens NOT DETECTED NOT DETECTED Final   Haemophilus influenzae NOT DETECTED NOT DETECTED Final   Neisseria meningitidis NOT DETECTED NOT DETECTED Final   Pseudomonas aeruginosa NOT DETECTED NOT DETECTED Final   Candida albicans NOT DETECTED NOT DETECTED Final   Candida glabrata NOT DETECTED NOT DETECTED Final   Candida krusei NOT DETECTED NOT DETECTED Final   Candida parapsilosis NOT DETECTED NOT DETECTED Final   Candida tropicalis NOT DETECTED NOT DETECTED Final  Blood Culture (routine x 2)     Status: None (Preliminary result)   Collection Time: 03/27/16 12:30 PM  Result Value Ref Range Status   Specimen Description BLOOD RIGHT ANTECUBITAL  Final   Special Requests BOTTLES DRAWN AEROBIC AND ANAEROBIC 5 ML  Final   Culture NO GROWTH 2 DAYS  Final   Report Status  PENDING  Incomplete  Urine culture     Status: Abnormal   Collection Time: 03/27/16  1:08 PM  Result Value Ref Range Status   Specimen Description URINE, RANDOM  Final   Special Requests NONE  Final   Culture MULTIPLE SPECIES PRESENT, SUGGEST RECOLLECTION (A)  Final   Report Status 03/28/2016 FINAL  Final  MRSA PCR Screening     Status: None   Collection Time: 03/28/16 12:20 PM  Result Value Ref Range Status   MRSA by PCR NEGATIVE NEGATIVE Final    Comment:        The GeneXpert MRSA Assay (FDA approved for NASAL specimens only), is one component of a comprehensive MRSA colonization surveillance program. It is not intended to diagnose MRSA infection nor to guide or monitor treatment for MRSA infections.          Radiology Studies: No results found.      Scheduled Meds: . furosemide      . furosemide  40 mg Oral Daily  . heparin  5,000 Units Subcutaneous Q8H  . ipratropium-albuterol  3 mL Nebulization TID  . levofloxacin      . levofloxacin  500 mg Oral Daily  . potassium chloride  40 mEq Oral BID   Continuous Infusions:   LOS: 2 days    Time spent: 47min    Domenic Polite, MD [facility] Pager [phone]  If 7PM-7AM, please contact night-coverage [email or web address] Password [email or web address] 03/30/2016, 2:50 PM

## 2016-03-31 DIAGNOSIS — J209 Acute bronchitis, unspecified: Secondary | ICD-10-CM

## 2016-03-31 MED ORDER — POTASSIUM CHLORIDE CRYS ER 20 MEQ PO TBCR: 40.0000 meq | EXTENDED_RELEASE_TABLET | Freq: Every day | ORAL | 0 refills | Status: DC

## 2016-03-31 MED ORDER — FUROSEMIDE 20 MG PO TABS: 20.0000 mg | ORAL_TABLET | Freq: Every day | ORAL | 0 refills | Status: AC

## 2016-03-31 MED ORDER — LEVOFLOXACIN 250 MG PO TABS: 250.0000 mg | ORAL_TABLET | Freq: Every day | ORAL | 0 refills | Status: DC

## 2016-03-31 NOTE — Progress Notes (Signed)
Patient is being discharged home to resume [facility] services patient and family are in agreement with transitional care plans. Referral called int ot Butch Penny With [facility]. Patient will be transported home via [facility]. No further CM needs identified.

## 2016-03-31 NOTE — Discharge Summary (Signed)
Physician Discharge Summary  Sarah Perkins [id number] DOB: October 19, 1917 DOA: 03/27/2016  PCP: Thressa Sheller, MD  Admit date: 03/27/2016 Discharge date: 03/31/2016  Time spent: 45 minutes  Recommendations for Outpatient Follow-up:  1. PCP Dr.Macenzie in 1 week, please discuss end of life care and set up Home Hospice  Discharge Diagnoses:  Principal Problem:   Sepsis ([place])   Aspiration pneumonia   Acute on chronic diastolic CHF   Dementia   Severe malnutrition   Acute hypokalemia   Essential hypertension   Acute respiratory failure with hypoxemia [facility])   Discharge Condition: stable  Diet recommendation: low sodium heart healthy  Filed Weights   03/27/16 2035 03/30/16 2259  Weight: 61.7 kg (136 lb) 55.7 kg (122 lb 12.8 oz)    History of present illness:  Sarah Perkins a 82 y.o.femalewith past medical history significant for Dementia, aneurysm, congestive heart failure, HTN, UTIs presented to the ED with weakness. Per patient's son patient vomited on Sunday, then became short of breath  Hospital Course:    Sepsis/Aspiration pneumonia -improved -h/o GERD, and h/o dysphagia -SLP eval completed: mild aspiration risk noted and D2 diet and thin liquids recommended - initially treated with IV Vancomycin and Zosyn then transitioned to PO levaquin  -continue for PO Abx for 56more days to complete Rx -FU Blood Cx with 1/2 coag negative staph which is consistent with contaminant  -discussed with son about need to consider home Hospice, he kept saying he will think about it, needs further discussion with PCP  Acute on chronic diastolic CHF -improved with diuresis, -changed to PO lasix, continue 20mg  lasix post discharge -ECHO with EF of 65%   Dementia -stable   Acute hypokalemia -replaced   Debilitated and bed bound  -since hip fracture earlier this year -Pt eval completed, SNF recommended but has run out of rehab/medicare days and son elects to  taking her home with home health instead if private pay SNF    Essential hypertension -stable, monitor  Discharge Exam: Vitals:   03/31/16 0559 03/31/16 0952  BP: (!) 149/52 (!) 113/42  Pulse: 86 95  Resp: 16 17  Temp: 97.8 F (36.6 C) 98.8 F (37.1 C)    General: AAOx2 Cardiovascular: S1S2/RRR Respiratory: ronchi at bases  Discharge Instructions   Discharge Instructions    Diet - low sodium heart healthy    Complete by:  As directed    Increase activity slowly    Complete by:  As directed      Current Discharge Medication List    START taking these medications   Details  levofloxacin (LEVAQUIN) 250 MG tablet Take 1 tablet (250 mg total) by mouth daily. For 3days Qty: 3 tablet, Refills: 0    potassium chloride SA (K-DUR,KLOR-CON) 20 MEQ tablet Take 2 tablets (40 mEq total) by mouth daily. Qty: 30 tablet, Refills: 0      CONTINUE these medications which have CHANGED   Details  furosemide (LASIX) 20 MG tablet Take 1 tablet (20 mg total) by mouth daily. Refills: 0      CONTINUE these medications which have NOT CHANGED   Details  acetaminophen (TYLENOL) 325 MG tablet Take 2 tablets (650 mg total) by mouth every 6 (six) hours as needed for mild pain or moderate pain (or Fever >/= 101).    amLODipine (NORVASC) 5 MG tablet Take 5 mg by mouth daily.    CALCIUM-MAGNESIUM-ZINC PO Take 1 capsule by mouth daily.    cyanocobalamin 100 MCG tablet Take 100 mcg by  mouth daily.    docusate sodium (COLACE) 100 MG capsule Take 100 mg by mouth at bedtime.    ferrous sulfate 325 (65 FE) MG tablet Take 325 mg by mouth daily with breakfast.    pantoprazole (PROTONIX) 40 MG tablet Take 1 tablet (40 mg total) by mouth daily. Qty: 30 tablet, Refills: 0    polyethylene glycol (MIRALAX / GLYCOLAX) packet Take 17 g by mouth every morning.    ranitidine (ZANTAC) 150 MG tablet Take 150 mg by mouth 2 (two) times daily as needed for heartburn.    vitamin C (ASCORBIC ACID) 500 MG  tablet Take 500 mg by mouth daily.       No Known Allergies Follow-up Information    MACKENZIE,BRIAN, MD. Schedule an appointment as soon as possible for a visit in 1 week(s).   Specialty:  Internal Medicine Contact information: [address], [place] [address] [phone]            The results of significant diagnostics from this hospitalization (including imaging, microbiology, ancillary and laboratory) are listed below for reference.    Significant Diagnostic Studies: Ct Head Wo Contrast  Result Date: 03/27/2016 CLINICAL DATA:  Hypoxia. EXAM: CT HEAD WITHOUT CONTRAST TECHNIQUE: Contiguous axial images were obtained from the base of the skull through the vertex without intravenous contrast. COMPARISON:  10/01/2015 FINDINGS: BRAIN: There is sulcal and ventricular prominence consistent with superficial and central atrophy. No intraparenchymal hemorrhage, mass effect nor midline shift. There are extensive periventricular and subcortical white matter hypodensities consistent with chronic small vessel ischemic disease. No acute large vascular territory infarcts. No abnormal extra-axial fluid collections. Basal cisterns are patent. VASCULAR: Moderate calcific atherosclerosis of the carotid siphons. Beam hardening artifacts along the anterior skull base from aneurysm clips as before. SKULL: No skull fracture. Prior right frontal craniotomy. No significant scalp soft tissue swelling. SINUSES/ORBITS: The mastoid air-cells are clear. The included paranasal sinuses are well-aerated.The included ocular globes and orbital contents are non-suspicious. Bilateral lens replacement surgical change. OTHER: None. IMPRESSION: Chronic central and superficial atrophy with moderate to marked chronic small vessel ischemic disease of periventricular and subcortical white matter. No acute intracranial hemorrhage, acute large vascular territory infarction, mass or midline shift. Prior right  frontal craniotomy with aneurysm clipping. Electronically Signed   By: Ashley Royalty M.D.   On: 03/27/2016 16:53   Portable Chest 1 View  Result Date: 03/28/2016 CLINICAL DATA:  Hypoxia EXAM: PORTABLE CHEST 1 VIEW COMPARISON:  03/27/2016 FINDINGS: Progression of right lower lobe airspace disease and small right effusion. No change in left lower lobe airspace disease. Pulmonary vascular congestion. IMPRESSION: Progression of right lower lobe airspace disease and right pleural effusion. Pulmonary vascular congestion suggesting congestive heart failure. Electronically Signed   By: Franchot Gallo M.D.   On: 03/28/2016 07:35   Dg Chest Port 1 View  Result Date: 03/27/2016 CLINICAL DATA:  Hypoxia.  Sepsis.  Fever.  Altered mental status. EXAM: PORTABLE CHEST 1 VIEW COMPARISON:  One-view chest x-ray from the same day. FINDINGS: The heart is enlarged. There is progressive edema. Bilateral pleural effusions are suspected. Bibasilar airspace disease is new. Atherosclerotic calcifications are present at the aortic arch. Moderate osteopenia is noted. The visualized soft tissues and bony thorax are unremarkable. IMPRESSION: 1. Interval development of moderate edema and bilateral effusions suggesting congestive heart failure. 2. Bibasilar airspace disease likely reflects atelectasis. 3. Stable cardiomegaly. 4. Aortic atherosclerosis. Electronically Signed   By: San Morelle M.D.   On: 03/27/2016 17:56  Dg Chest Port 1 View  Result Date: 03/27/2016 CLINICAL DATA:  Productive cough and shortness of breath. EXAM: PORTABLE CHEST 1 VIEW COMPARISON:  10/24/2015 FINDINGS: Mild enlargement of the cardiac silhouette is stable. Atherosclerotic calcifications at the aortic arch. Improved aeration at the left lung base compared to the previous examination. Prominent lung markings at both lung bases may represent chronic changes. There is no focal airspace disease or pulmonary edema. Evidence for scarring at the lung  apices. IMPRESSION: No acute chest findings. Stable mild cardiomegaly. Aortic atherosclerosis. Electronically Signed   By: Markus Daft M.D.   On: 03/27/2016 12:05    Microbiology: Recent Results (from the past 240 hour(s))  Blood Culture (routine x 2)     Status: Abnormal   Collection Time: 03/27/16 11:35 AM  Result Value Ref Range Status   Specimen Description BLOOD LEFT FOREARM  Final   Special Requests BOTTLES DRAWN AEROBIC AND ANAEROBIC  5CC  Final   Culture  Setup Time   Final    GRAM POSITIVE COCCI IN CLUSTERS IN BOTH AEROBIC AND ANAEROBIC BOTTLES CRITICAL RESULT CALLED TO, READ BACK BY AND VERIFIED WITH: M TURNER 03/28/16 @ 35 M VESTAL    Culture (A)  Final    STAPHYLOCOCCUS SPECIES (COAGULASE NEGATIVE) THE SIGNIFICANCE OF ISOLATING THIS ORGANISM FROM A SINGLE VENIPUNCTURE CANNOT BE PREDICTED WITHOUT FURTHER CLINICAL AND CULTURE CORRELATION. SUSCEPTIBILITIES AVAILABLE ONLY ON REQUEST.    Report Status 03/30/2016 FINAL  Final  Blood Culture ID Panel (Reflexed)     Status: Abnormal   Collection Time: 03/27/16 11:35 AM  Result Value Ref Range Status   Enterococcus species NOT DETECTED NOT DETECTED Final   Listeria monocytogenes NOT DETECTED NOT DETECTED Final   Staphylococcus species DETECTED (A) NOT DETECTED Final    Comment: CRITICAL RESULT CALLED TO, READ BACK BY AND VERIFIED WITH: M TURNER 03/28/16 @ 68 M VESTAL    Staphylococcus aureus NOT DETECTED NOT DETECTED Final   Methicillin resistance NOT DETECTED NOT DETECTED Final   Streptococcus species NOT DETECTED NOT DETECTED Final   Streptococcus agalactiae NOT DETECTED NOT DETECTED Final   Streptococcus pneumoniae NOT DETECTED NOT DETECTED Final   Streptococcus pyogenes NOT DETECTED NOT DETECTED Final   Acinetobacter baumannii NOT DETECTED NOT DETECTED Final   Enterobacteriaceae species NOT DETECTED NOT DETECTED Final   Enterobacter cloacae complex NOT DETECTED NOT DETECTED Final   Escherichia coli NOT DETECTED NOT  DETECTED Final   Klebsiella oxytoca NOT DETECTED NOT DETECTED Final   Klebsiella pneumoniae NOT DETECTED NOT DETECTED Final   Proteus species NOT DETECTED NOT DETECTED Final   Serratia marcescens NOT DETECTED NOT DETECTED Final   Haemophilus influenzae NOT DETECTED NOT DETECTED Final   Neisseria meningitidis NOT DETECTED NOT DETECTED Final   Pseudomonas aeruginosa NOT DETECTED NOT DETECTED Final   Candida albicans NOT DETECTED NOT DETECTED Final   Candida glabrata NOT DETECTED NOT DETECTED Final   Candida krusei NOT DETECTED NOT DETECTED Final   Candida parapsilosis NOT DETECTED NOT DETECTED Final   Candida tropicalis NOT DETECTED NOT DETECTED Final  Blood Culture (routine x 2)     Status: None (Preliminary result)   Collection Time: 03/27/16 12:30 PM  Result Value Ref Range Status   Specimen Description BLOOD RIGHT ANTECUBITAL  Final   Special Requests BOTTLES DRAWN AEROBIC AND ANAEROBIC 5 ML  Final   Culture NO GROWTH 3 DAYS  Final   Report Status PENDING  Incomplete  Urine culture     Status: Abnormal  Collection Time: 03/27/16  1:08 PM  Result Value Ref Range Status   Specimen Description URINE, RANDOM  Final   Special Requests NONE  Final   Culture MULTIPLE SPECIES PRESENT, SUGGEST RECOLLECTION (A)  Final   Report Status 03/28/2016 FINAL  Final  MRSA PCR Screening     Status: None   Collection Time: 03/28/16 12:20 PM  Result Value Ref Range Status   MRSA by PCR NEGATIVE NEGATIVE Final    Comment:        The GeneXpert MRSA Assay (FDA approved for NASAL specimens only), is one component of a comprehensive MRSA colonization surveillance program. It is not intended to diagnose MRSA infection nor to guide or monitor treatment for MRSA infections.      Labs: Basic Metabolic Panel:  Recent Labs Lab 03/27/16 1138 03/27/16 1603 03/27/16 1806 03/28/16 0231 03/29/16 0436 03/30/16 0430  NA 141  --   --  141 143 141  K 3.1*  --   --  4.0 2.9* 3.7  CL 108  --   --   114* 108 103  CO2 26  --   --  23 24 29   GLUCOSE 145*  --   --  154* 141* 88  BUN 24*  --   --  21* 26* 23*  CREATININE 0.73  --  0.73 0.71 0.95 0.80  CALCIUM 9.1  --   --  8.3* 8.3* 8.4*  MG  --  1.7  --   --   --   --   PHOS  --  2.2*  --   --   --   --    Liver Function Tests:  Recent Labs Lab 03/27/16 1138  AST 21  ALT 9*  ALKPHOS 44  BILITOT 0.7  PROT 8.0  ALBUMIN 2.8*   No results for input(s): LIPASE, AMYLASE in the last 168 hours. No results for input(s): AMMONIA in the last 168 hours. CBC:  Recent Labs Lab 03/27/16 1138 03/27/16 1806 03/28/16 0231 03/29/16 0437 03/30/16 0430  WBC 19.5* 14.9* 13.7* 10.6* 7.6  NEUTROABS 13.8*  --   --   --   --   HGB 9.6* 7.7* 8.0* 8.3* 8.7*  HCT 29.3* 23.5* 24.4* 24.9* 27.0*  MCV 88.5 88.0 88.7 88.0 89.1  PLT 262 188 177 197 232   Cardiac Enzymes:  Recent Labs Lab 03/27/16 1138 03/27/16 1603 03/27/16 2055 03/28/16 0231  TROPONINI 0.03* 0.04* 0.03* 0.04*   BNP: BNP (last 3 results)  Recent Labs  06/02/15 0516  BNP 61.4    ProBNP (last 3 results) No results for input(s): PROBNP in the last 8760 hours.  CBG: No results for input(s): GLUCAP in the last 168 hours.     SignedDomenic Polite MD.  [facility] 03/31/2016, 12:10 PM

## 2016-03-31 NOTE — Progress Notes (Addendum)
Pt discharged home per MD. Casemanager made arrangements to resume [facility]. NSL removed with catheter intact.  Dishcharge instructions provided to son, [name].  PTAR at bedside to provide transport home.  Notified Charles, patient's son, that patient was on her way. Bartholomew Crews, RN

## 2016-04-01 LAB — CULTURE, BLOOD (ROUTINE X 2): Culture: NO GROWTH

## 2016-04-02 ENCOUNTER — Other Ambulatory Visit: Payer: Self-pay

## 2016-04-02 DIAGNOSIS — I130 Hypertensive heart and chronic kidney disease with heart failure and stage 1 through stage 4 chronic kidney disease, or unspecified chronic kidney disease: Secondary | ICD-10-CM | POA: Diagnosis not present

## 2016-04-02 DIAGNOSIS — L89893 Pressure ulcer of other site, stage 3: Secondary | ICD-10-CM | POA: Diagnosis not present

## 2016-04-02 DIAGNOSIS — S72401D Unspecified fracture of lower end of right femur, subsequent encounter for closed fracture with routine healing: Secondary | ICD-10-CM | POA: Diagnosis not present

## 2016-04-02 DIAGNOSIS — I5032 Chronic diastolic (congestive) heart failure: Secondary | ICD-10-CM | POA: Diagnosis not present

## 2016-04-02 DIAGNOSIS — L988 Other specified disorders of the skin and subcutaneous tissue: Secondary | ICD-10-CM | POA: Diagnosis not present

## 2016-04-02 DIAGNOSIS — N182 Chronic kidney disease, stage 2 (mild): Secondary | ICD-10-CM | POA: Diagnosis not present

## 2016-04-02 NOTE — Patient Outreach (Signed)
[facility]) Care Management  04/02/2016   MAEDEAN GURGANIOUS 13-Feb-1918 [id number]  Subjective:  Spoke with patient's son, [name]. Charles requested additional information on Palliative Care. [name] stated patient is very weak, tired.    Objective:  Telephonic encounter.  Current Medications:  Current Outpatient Prescriptions  Medication Sig Dispense Refill  . acetaminophen (TYLENOL) 325 MG tablet Take 2 tablets (650 mg total) by mouth every 6 (six) hours as needed for mild pain or moderate pain (or Fever >/= 101).    Marland Kitchen amLODipine (NORVASC) 5 MG tablet Take 5 mg by mouth daily.    Marland Kitchen CALCIUM-MAGNESIUM-ZINC PO Take 1 capsule by mouth daily.    . cyanocobalamin 100 MCG tablet Take 100 mcg by mouth daily.    Marland Kitchen docusate sodium (COLACE) 100 MG capsule Take 100 mg by mouth at bedtime.    . ferrous sulfate 325 (65 FE) MG tablet Take 325 mg by mouth daily with breakfast.    . furosemide (LASIX) 20 MG tablet Take 1 tablet (20 mg total) by mouth daily.  0  . levofloxacin (LEVAQUIN) 250 MG tablet Take 1 tablet (250 mg total) by mouth daily. For 3days 3 tablet 0  . pantoprazole (PROTONIX) 40 MG tablet Take 1 tablet (40 mg total) by mouth daily. 30 tablet 0  . polyethylene glycol (MIRALAX / GLYCOLAX) packet Take 17 g by mouth every morning.    . potassium chloride SA (K-DUR,KLOR-CON) 20 MEQ tablet Take 2 tablets (40 mEq total) by mouth daily. 30 tablet 0  . ranitidine (ZANTAC) 150 MG tablet Take 150 mg by mouth 2 (two) times daily as needed for heartburn.    . vitamin C (ASCORBIC ACID) 500 MG tablet Take 500 mg by mouth daily.     No current facility-administered medications for this visit.     Functional Status:  In your present state of health, do you have any difficulty performing the following activities: 03/28/2016 03/27/2016  Hearing? - Y  Vision? - Y  Difficulty concentrating or making decisions? - Y  Walking or climbing stairs? - Y  Dressing or bathing? - Y   Doing errands, shopping? Y Y  Preparing Food and eating ? - -  Using the Toilet? - -  In the past six months, have you accidently leaked urine? - -  Do you have problems with loss of bowel control? - -  Managing your Medications? - -  Managing your Finances? - -  Housekeeping or managing your Housekeeping? - -  Some recent data might be hidden    Fall/Depression Screening: PHQ 2/9 Scores 11/02/2015  Exception Documentation Other- indicate reason in comment box  Not completed Patient has dementia and unable to answer questions appropriately.   [facility] CM Care Plan Problem One   Flowsheet Row Most Recent Value  Care Plan Problem One  patient has recent acute care discharge  Role Documenting the Problem One  Care Management [place] for Problem One  Active  THN Long Term Goal (31-90 days)  In the next 31 days, patient and family will have made a decision regarding advanced planning   THN Long Term Goal Start Date  04/02/16  Interventions for Problem One Long Term Goal  Transition of care call made to assess need for community care coordination  THN CM Short Term Goal #1 (0-30 days)  In the next 14 days, patient and family will meet with this RNCM to discuss community resources for end of life care.  THN  CM Short Term Goal #1 Start Date  04/02/16  Interventions for Short Term Goal #1  Initial telehphone call for assessment of patient and family needs for smooth transition of care.     [facility] CM Care Plan Problem Two   Flowsheet Row Most Recent Value  Care Plan Problem Two  Patient had a recent hospitalization for COPD  Role Documenting the Problem Two  Care Management [place] for Problem Two  Active  THN CM Short Term Goal #1 (0-30 days)  In the next 28 days, patient and family will meet with THN's RNCM for COPD Education  [facility] CM Short Term Goal #1 Start Date  04/02/16  Interventions for Short Term Goal #2   Initial call to establish case management goals      Assessment:  Patient is currently living at home. Patient's family requested additional information regarding Hospice once home health services are completed. Patient is currently receiving services through [facility].   Plan:  Home visit later this month for community care coordination.

## 2016-04-03 ENCOUNTER — Ambulatory Visit: Payer: Medicare Other

## 2016-04-04 ENCOUNTER — Other Ambulatory Visit: Payer: Self-pay

## 2016-04-04 DIAGNOSIS — S72401D Unspecified fracture of lower end of right femur, subsequent encounter for closed fracture with routine healing: Secondary | ICD-10-CM | POA: Diagnosis not present

## 2016-04-04 DIAGNOSIS — L988 Other specified disorders of the skin and subcutaneous tissue: Secondary | ICD-10-CM | POA: Diagnosis not present

## 2016-04-04 DIAGNOSIS — I5032 Chronic diastolic (congestive) heart failure: Secondary | ICD-10-CM | POA: Diagnosis not present

## 2016-04-04 DIAGNOSIS — N182 Chronic kidney disease, stage 2 (mild): Secondary | ICD-10-CM | POA: Diagnosis not present

## 2016-04-04 DIAGNOSIS — I130 Hypertensive heart and chronic kidney disease with heart failure and stage 1 through stage 4 chronic kidney disease, or unspecified chronic kidney disease: Secondary | ICD-10-CM | POA: Diagnosis not present

## 2016-04-04 DIAGNOSIS — L89893 Pressure ulcer of other site, stage 3: Secondary | ICD-10-CM | POA: Diagnosis not present

## 2016-04-04 NOTE — Patient Outreach (Signed)
     Message received from Freda Jackson, [place] Assistant to inform this RNCM on receiving a call from patient's son to cancel appointment. RNCM made call to Olena Heckle stated he was a  Little confused about the services being offered by [facility]. This RNCM provided [name] with brief description [facility] Programs and Case Chiropractor.  [name] stated he would like to keep the appointment, requested call the morning of scheduled appointment to remind him.  Plan: Home visit later this month.

## 2016-04-05 DIAGNOSIS — L988 Other specified disorders of the skin and subcutaneous tissue: Secondary | ICD-10-CM | POA: Diagnosis not present

## 2016-04-05 DIAGNOSIS — I5032 Chronic diastolic (congestive) heart failure: Secondary | ICD-10-CM | POA: Diagnosis not present

## 2016-04-05 DIAGNOSIS — N182 Chronic kidney disease, stage 2 (mild): Secondary | ICD-10-CM | POA: Diagnosis not present

## 2016-04-05 DIAGNOSIS — L89893 Pressure ulcer of other site, stage 3: Secondary | ICD-10-CM | POA: Diagnosis not present

## 2016-04-05 DIAGNOSIS — I130 Hypertensive heart and chronic kidney disease with heart failure and stage 1 through stage 4 chronic kidney disease, or unspecified chronic kidney disease: Secondary | ICD-10-CM | POA: Diagnosis not present

## 2016-04-05 DIAGNOSIS — S72401D Unspecified fracture of lower end of right femur, subsequent encounter for closed fracture with routine healing: Secondary | ICD-10-CM | POA: Diagnosis not present

## 2016-04-08 DIAGNOSIS — N182 Chronic kidney disease, stage 2 (mild): Secondary | ICD-10-CM | POA: Diagnosis not present

## 2016-04-08 DIAGNOSIS — L89893 Pressure ulcer of other site, stage 3: Secondary | ICD-10-CM | POA: Diagnosis not present

## 2016-04-08 DIAGNOSIS — S72401D Unspecified fracture of lower end of right femur, subsequent encounter for closed fracture with routine healing: Secondary | ICD-10-CM | POA: Diagnosis not present

## 2016-04-08 DIAGNOSIS — L988 Other specified disorders of the skin and subcutaneous tissue: Secondary | ICD-10-CM | POA: Diagnosis not present

## 2016-04-08 DIAGNOSIS — I5032 Chronic diastolic (congestive) heart failure: Secondary | ICD-10-CM | POA: Diagnosis not present

## 2016-04-08 DIAGNOSIS — I130 Hypertensive heart and chronic kidney disease with heart failure and stage 1 through stage 4 chronic kidney disease, or unspecified chronic kidney disease: Secondary | ICD-10-CM | POA: Diagnosis not present

## 2016-04-09 ENCOUNTER — Other Ambulatory Visit: Payer: Self-pay

## 2016-04-09 DIAGNOSIS — S72401D Unspecified fracture of lower end of right femur, subsequent encounter for closed fracture with routine healing: Secondary | ICD-10-CM | POA: Diagnosis not present

## 2016-04-09 DIAGNOSIS — L89893 Pressure ulcer of other site, stage 3: Secondary | ICD-10-CM | POA: Diagnosis not present

## 2016-04-09 DIAGNOSIS — N182 Chronic kidney disease, stage 2 (mild): Secondary | ICD-10-CM | POA: Diagnosis not present

## 2016-04-09 DIAGNOSIS — L988 Other specified disorders of the skin and subcutaneous tissue: Secondary | ICD-10-CM | POA: Diagnosis not present

## 2016-04-09 DIAGNOSIS — I130 Hypertensive heart and chronic kidney disease with heart failure and stage 1 through stage 4 chronic kidney disease, or unspecified chronic kidney disease: Secondary | ICD-10-CM | POA: Diagnosis not present

## 2016-04-09 DIAGNOSIS — I5032 Chronic diastolic (congestive) heart failure: Secondary | ICD-10-CM | POA: Diagnosis not present

## 2016-04-09 NOTE — Patient Outreach (Signed)
[facility]) Care Management   04/09/2016  Sarah Perkins 1917-08-04 [id number]  Sarah Perkins is an 82 y.o. female  Subjective:  This RNCM made telephone call to patient's son, Sarah Perkins as requested before ocming out for home visit. Sarah Perkins stated he and his mother are doing fine for right now, stated he would like a visit later in this assessment period once home health is completed.    Objective:   ROS Telephone contact  Physical Exam Telephonic contact  Encounter Medications:   Outpatient Encounter Prescriptions as of 04/09/2016  Medication Sig  . acetaminophen (TYLENOL) 325 MG tablet Take 2 tablets (650 mg total) by mouth every 6 (six) hours as needed for mild pain or moderate pain (or Fever >/= 101).  Marland Kitchen amLODipine (NORVASC) 5 MG tablet Take 5 mg by mouth daily.  Marland Kitchen CALCIUM-MAGNESIUM-ZINC PO Take 1 capsule by mouth daily.  . cyanocobalamin 100 MCG tablet Take 100 mcg by mouth daily.  Marland Kitchen docusate sodium (COLACE) 100 MG capsule Take 100 mg by mouth at bedtime.  . ferrous sulfate 325 (65 FE) MG tablet Take 325 mg by mouth daily with breakfast.  . furosemide (LASIX) 20 MG tablet Take 1 tablet (20 mg total) by mouth daily.  Marland Kitchen levofloxacin (LEVAQUIN) 250 MG tablet Take 1 tablet (250 mg total) by mouth daily. For 3days  . pantoprazole (PROTONIX) 40 MG tablet Take 1 tablet (40 mg total) by mouth daily.  . polyethylene glycol (MIRALAX / GLYCOLAX) packet Take 17 g by mouth every morning.  . potassium chloride SA (K-DUR,KLOR-CON) 20 MEQ tablet Take 2 tablets (40 mEq total) by mouth daily.  . ranitidine (ZANTAC) 150 MG tablet Take 150 mg by mouth 2 (two) times daily as needed for heartburn.  . vitamin C (ASCORBIC ACID) 500 MG tablet Take 500 mg by mouth daily.   No facility-administered encounter medications on file as of 04/09/2016.     Functional Status:   In your present state of health, do you have any difficulty performing the following activities:  04/09/2016 03/28/2016  Hearing? [place]? Y -  Difficulty concentrating or making decisions? Y -  Walking or climbing stairs? Y -  Dressing or bathing? Y -  Doing errands, shopping? [name]  Preparing Food and eating ? Y -  Using the Toilet? Y -  In the past six months, have you accidently leaked urine? Y -  Do you have problems with loss of bowel control? Y -  Managing your Medications? Y -  Managing your Finances? Y -  Housekeeping or managing your Housekeeping? Y -  Some recent data might be hidden    Fall/Depression Screening:    PHQ 2/9 Scores 04/09/2016 11/02/2015  PHQ - 2 Score 0 -  Exception Documentation - Other- indicate reason in comment box  Not completed - Patient has dementia and unable to answer questions appropriately.   [facility] CM Care Plan Problem One   Flowsheet Row Most Recent Value  Care Plan Problem One  patient has recent acute care discharge  Role Documenting the Problem One  Care Management [place] for Problem One  Active  THN Long Term Goal (31-90 days)  In the next 31 days, patient and family will have made a decision regarding advanced planning   THN Long Term Goal Start Date  04/02/16  Interventions for Problem One Long Term Goal  iniitlal home visit to assess patient needs for community care coordination  [facility] CM Short  Term Goal #1 (0-30 days)  In the next 14 days, patient will meet with [facility] for assessment of community care needs/possible end of life care  [facility] CM Short Term Goal #1 Start Date  04/02/16  [facility] CM Short Term Goal #1 Met Date  04/09/16  Interventions for Short Term Goal #1  initial home visit, patient disclosed needing a replacement blood pressure cuff to measure her  blood pressures at home.      [facility] CM Care Plan Problem Two   Flowsheet Row Most Recent Value  Care Plan Problem Two  Patient had a recent hospitalization for COPD  Role Documenting the Problem Two  Care Management [place] for Problem Two  Active   [facility]N CM Short Term Goal #1 (0-30 days)  In the next 28 days, patient and family will meet with THN's RNCM for COPD Education  [facility] CM Short Term Goal #1 Start Date  04/02/16  Interventions for Short Term Goal #2   Initial call to establish case management goals     Assessment:   Telephonic contact made per son's charles request to determine need for home visit. Charles requested a delay in making home visit due to other disciplines in the home. Sarah Perkins stated his mother (the patient) was tired due to today's activities, needed to rest. CM informed Charles visit could be delayed and weekly contact for the next 3 weeks to determine when and if a home visit would be receptive.  Plan:  Telephone contact later this month for assessment of community care coordination needs.

## 2016-04-11 DIAGNOSIS — I5032 Chronic diastolic (congestive) heart failure: Secondary | ICD-10-CM | POA: Diagnosis not present

## 2016-04-11 DIAGNOSIS — N182 Chronic kidney disease, stage 2 (mild): Secondary | ICD-10-CM | POA: Diagnosis not present

## 2016-04-11 DIAGNOSIS — L988 Other specified disorders of the skin and subcutaneous tissue: Secondary | ICD-10-CM | POA: Diagnosis not present

## 2016-04-11 DIAGNOSIS — S72401D Unspecified fracture of lower end of right femur, subsequent encounter for closed fracture with routine healing: Secondary | ICD-10-CM | POA: Diagnosis not present

## 2016-04-11 DIAGNOSIS — I130 Hypertensive heart and chronic kidney disease with heart failure and stage 1 through stage 4 chronic kidney disease, or unspecified chronic kidney disease: Secondary | ICD-10-CM | POA: Diagnosis not present

## 2016-04-11 DIAGNOSIS — L89893 Pressure ulcer of other site, stage 3: Secondary | ICD-10-CM | POA: Diagnosis not present

## 2016-04-12 DIAGNOSIS — N182 Chronic kidney disease, stage 2 (mild): Secondary | ICD-10-CM | POA: Diagnosis not present

## 2016-04-12 DIAGNOSIS — L89893 Pressure ulcer of other site, stage 3: Secondary | ICD-10-CM | POA: Diagnosis not present

## 2016-04-12 DIAGNOSIS — I130 Hypertensive heart and chronic kidney disease with heart failure and stage 1 through stage 4 chronic kidney disease, or unspecified chronic kidney disease: Secondary | ICD-10-CM | POA: Diagnosis not present

## 2016-04-12 DIAGNOSIS — I5032 Chronic diastolic (congestive) heart failure: Secondary | ICD-10-CM | POA: Diagnosis not present

## 2016-04-12 DIAGNOSIS — S72401D Unspecified fracture of lower end of right femur, subsequent encounter for closed fracture with routine healing: Secondary | ICD-10-CM | POA: Diagnosis not present

## 2016-04-12 DIAGNOSIS — L988 Other specified disorders of the skin and subcutaneous tissue: Secondary | ICD-10-CM | POA: Diagnosis not present

## 2016-04-16 DIAGNOSIS — I5032 Chronic diastolic (congestive) heart failure: Secondary | ICD-10-CM | POA: Diagnosis not present

## 2016-04-16 DIAGNOSIS — N182 Chronic kidney disease, stage 2 (mild): Secondary | ICD-10-CM | POA: Diagnosis not present

## 2016-04-16 DIAGNOSIS — I130 Hypertensive heart and chronic kidney disease with heart failure and stage 1 through stage 4 chronic kidney disease, or unspecified chronic kidney disease: Secondary | ICD-10-CM | POA: Diagnosis not present

## 2016-04-16 DIAGNOSIS — L89893 Pressure ulcer of other site, stage 3: Secondary | ICD-10-CM | POA: Diagnosis not present

## 2016-04-16 DIAGNOSIS — L988 Other specified disorders of the skin and subcutaneous tissue: Secondary | ICD-10-CM | POA: Diagnosis not present

## 2016-04-16 DIAGNOSIS — S72401D Unspecified fracture of lower end of right femur, subsequent encounter for closed fracture with routine healing: Secondary | ICD-10-CM | POA: Diagnosis not present

## 2016-04-17 DIAGNOSIS — I5032 Chronic diastolic (congestive) heart failure: Secondary | ICD-10-CM | POA: Diagnosis not present

## 2016-04-17 DIAGNOSIS — N182 Chronic kidney disease, stage 2 (mild): Secondary | ICD-10-CM | POA: Diagnosis not present

## 2016-04-17 DIAGNOSIS — L988 Other specified disorders of the skin and subcutaneous tissue: Secondary | ICD-10-CM | POA: Diagnosis not present

## 2016-04-17 DIAGNOSIS — I130 Hypertensive heart and chronic kidney disease with heart failure and stage 1 through stage 4 chronic kidney disease, or unspecified chronic kidney disease: Secondary | ICD-10-CM | POA: Diagnosis not present

## 2016-04-17 DIAGNOSIS — S72401D Unspecified fracture of lower end of right femur, subsequent encounter for closed fracture with routine healing: Secondary | ICD-10-CM | POA: Diagnosis not present

## 2016-04-17 DIAGNOSIS — L89893 Pressure ulcer of other site, stage 3: Secondary | ICD-10-CM | POA: Diagnosis not present

## 2016-04-18 ENCOUNTER — Other Ambulatory Visit: Payer: Self-pay

## 2016-04-18 DIAGNOSIS — S72401D Unspecified fracture of lower end of right femur, subsequent encounter for closed fracture with routine healing: Secondary | ICD-10-CM | POA: Diagnosis not present

## 2016-04-18 DIAGNOSIS — L988 Other specified disorders of the skin and subcutaneous tissue: Secondary | ICD-10-CM | POA: Diagnosis not present

## 2016-04-18 DIAGNOSIS — I130 Hypertensive heart and chronic kidney disease with heart failure and stage 1 through stage 4 chronic kidney disease, or unspecified chronic kidney disease: Secondary | ICD-10-CM | POA: Diagnosis not present

## 2016-04-18 DIAGNOSIS — N182 Chronic kidney disease, stage 2 (mild): Secondary | ICD-10-CM | POA: Diagnosis not present

## 2016-04-18 DIAGNOSIS — I5032 Chronic diastolic (congestive) heart failure: Secondary | ICD-10-CM | POA: Diagnosis not present

## 2016-04-18 DIAGNOSIS — L89893 Pressure ulcer of other site, stage 3: Secondary | ICD-10-CM | POA: Diagnosis not present

## 2016-04-18 NOTE — Patient Outreach (Signed)
[facility]) Care Management  04/18/2016   [name] 07-07-1917 [id number]  Subjective:  RNCM spoke with patient's son, Sarah Perkins, who stated patient is resting at this time. Sarah Perkins also stated patient is tolerating her therapy sessions, eating well and taking her medications.    Objective:  Patient is a 82 year old, with recent hospitalization Patient primary caregiver is her son, [name]  Current Medications:  Current Outpatient Prescriptions  Medication Sig Dispense Refill  . acetaminophen (TYLENOL) 325 MG tablet Take 2 tablets (650 mg total) by mouth every 6 (six) hours as needed for mild pain or moderate pain (or Fever >/= 101).    Marland Kitchen amLODipine (NORVASC) 5 MG tablet Take 5 mg by mouth daily.    Marland Kitchen CALCIUM-MAGNESIUM-ZINC PO Take 1 capsule by mouth daily.    . cyanocobalamin 100 MCG tablet Take 100 mcg by mouth daily.    Marland Kitchen docusate sodium (COLACE) 100 MG capsule Take 100 mg by mouth at bedtime.    . ferrous sulfate 325 (65 FE) MG tablet Take 325 mg by mouth daily with breakfast.    . furosemide (LASIX) 20 MG tablet Take 1 tablet (20 mg total) by mouth daily.  0  . levofloxacin (LEVAQUIN) 250 MG tablet Take 1 tablet (250 mg total) by mouth daily. For 3days 3 tablet 0  . pantoprazole (PROTONIX) 40 MG tablet Take 1 tablet (40 mg total) by mouth daily. 30 tablet 0  . polyethylene glycol (MIRALAX / GLYCOLAX) packet Take 17 g by mouth every morning.    . potassium chloride SA (K-DUR,KLOR-CON) 20 MEQ tablet Take 2 tablets (40 mEq total) by mouth daily. 30 tablet 0  . ranitidine (ZANTAC) 150 MG tablet Take 150 mg by mouth 2 (two) times daily as needed for heartburn.    . vitamin C (ASCORBIC ACID) 500 MG tablet Take 500 mg by mouth daily.     No current facility-administered medications for this visit.     Functional Status:  In your present state of health, do you have any difficulty performing the following activities: 04/09/2016 03/28/2016  Hearing? [place]? Y -  Difficulty concentrating or making decisions? Y -  Walking or climbing stairs? Y -  Dressing or bathing? Y -  Doing errands, shopping? [name]  Preparing Food and eating ? Y -  Using the Toilet? Y -  In the past six months, have you accidently leaked urine? Y -  Do you have problems with loss of bowel control? Y -  Managing your Medications? Y -  Managing your Finances? Y -  Housekeeping or managing your Housekeeping? Y -  Some recent data might be hidden    Fall/Depression Screening: PHQ 2/9 Scores 04/09/2016 11/02/2015  PHQ - 2 Score 0 -  Exception Documentation - Other- indicate reason in comment box  Not completed - Patient has dementia and unable to answer questions appropriately.   [facility] CM Care Plan Problem One   Flowsheet Row Most Recent Value  Care Plan Problem One  patient has recent acute care discharge  Role Documenting the Problem One  Care Management [place] for Problem One  Active  [facility] Long Term Goal (31-90 days)  In the next 31 days, patient and family will have made a decision regarding advanced planning   [facility]N Long Term Goal Start Date  04/02/16  Interventions for Problem One Long Term Goal  telephone follow up for assessment of community care coordination needs.  spoke  with patient's son, Sarah Perkins who states patient is currently resting, stated she is doing fine.    [facility] CM Short Term Goal #1 (0-30 days)  In the next 14 days, patient will meet with [facility] for assessment of community care needs/possible end of life care  [facility] CM Short Term Goal #1 Start Date  04/02/16  [facility] CM Short Term Goal #1 Met Date  04/09/16  Interventions for Short Term Goal #1  initial home visit, patient disclosed needing a replacement blood pressure cuff to measure her  blood pressures at home.      [facility] CM Care Plan Problem Two   Flowsheet Row Most Recent Value  Care Plan Problem Two  Patient had a recent hospitalization for COPD  Role Documenting the Problem Two  Care  Management [place] for Problem Two  Active  [facility] CM Short Term Goal #1 (0-30 days)  In the next 28 days, patient and family will meet with [facility]'s RNCM for COPD Education  [facility] CM Short Term Goal #1 Start Date  04/02/16  [facility] CM Short Term Goal #1 Met Date   04/09/16  Interventions for Short Term Goal #2   Patient and son stated they think they are ok at her age, thankful she has lived this long, has all information they need at this time.       Assessment:  Patient son, Sarah Perkins, states patient is resting comfortable, family has not decided on hospice at this time. Patient continues with home care, son is living with patient and acting as her primary caregiver,supported by other family members. Patient's son states they are able to afford her medications and copayments for medical and medications.  Plan:   Telephone call in the next 30 days to assess case management needs

## 2016-04-25 ENCOUNTER — Ambulatory Visit: Payer: Medicare Other

## 2016-05-03 ENCOUNTER — Other Ambulatory Visit: Payer: Self-pay

## 2016-05-03 NOTE — Patient Outreach (Signed)
    Unsuccessful attempt made to contact patient via telephone for transition of care. Plan to call patient/son, [name], later this month.

## 2016-05-06 ENCOUNTER — Other Ambulatory Visit: Payer: Self-pay

## 2016-05-06 NOTE — Patient Outreach (Signed)
[place] [facility]) Care Management  05/06/2016   Sarah Perkins [birth year] [id number]  Subjective:  RNCM spoke with patient's son, Sarah Perkins who states his mother has completed all her therapies. Sarah Perkins stated further he is assisting with her ADLs and IADLs.  Objective:  telphonic contact  Current Medications:  Current Outpatient Prescriptions  Medication Sig Dispense Refill  . acetaminophen (TYLENOL) 325 MG tablet Take 2 tablets (650 mg total) by mouth every 6 (six) hours as needed for mild pain or moderate pain (or Fever >/= 101).    Marland Kitchen amLODipine (NORVASC) 5 MG tablet Take 5 mg by mouth daily.    Marland Kitchen CALCIUM-MAGNESIUM-ZINC PO Take 1 capsule by mouth daily.    . cyanocobalamin 100 MCG tablet Take 100 mcg by mouth daily.    Marland Kitchen docusate sodium (COLACE) 100 MG capsule Take 100 mg by mouth at bedtime.    . ferrous sulfate 325 (65 FE) MG tablet Take 325 mg by mouth daily with breakfast.    . furosemide (LASIX) 20 MG tablet Take 1 tablet (20 mg total) by mouth daily.  0  . levofloxacin (LEVAQUIN) 250 MG tablet Take 1 tablet (250 mg total) by mouth daily. For 3days 3 tablet 0  . pantoprazole (PROTONIX) 40 MG tablet Take 1 tablet (40 mg total) by mouth daily. 30 tablet 0  . polyethylene glycol (MIRALAX / GLYCOLAX) packet Take 17 g by mouth every morning.    . potassium chloride SA (K-DUR,KLOR-CON) 20 MEQ tablet Take 2 tablets (40 mEq total) by mouth daily. 30 tablet 0  . ranitidine (ZANTAC) 150 MG tablet Take 150 mg by mouth 2 (two) times daily as needed for heartburn.    . vitamin C (ASCORBIC ACID) 500 MG tablet Take 500 mg by mouth daily.     No current facility-administered medications for this visit.     Functional Status:  In your present state of health, do you have any difficulty performing the following activities: 04/09/2016 03/28/2016  Hearing? [place]? Y -  Difficulty concentrating or making decisions? Y -  Walking or climbing stairs? Y -  Dressing or  bathing? Y -  Doing errands, shopping? [name]  Preparing Food and eating ? Y -  Using the Toilet? Y -  In the past six months, have you accidently leaked urine? Y -  Do you have problems with loss of bowel control? Y -  Managing your Medications? Y -  Managing your Finances? Y -  Housekeeping or managing your Housekeeping? Y -  Some recent data might be hidden    Fall/Depression Screening: PHQ 2/9 Scores 04/09/2016 11/02/2015  PHQ - 2 Score 0 -  Exception Documentation - Other- indicate reason in comment box  Not completed - Patient has dementia and unable to answer questions appropriately.   [facility] CM Care Plan Problem One   Flowsheet Row Most Recent Value  Care Plan Problem One  patient has recent acute care discharge  Role Documenting the Problem One  Care Management [place] for Problem One  Active  [facility]N Long Term Goal (31-90 days)  In the next 31 days, patient and family will have made a decision regarding advanced planning   THN Long Term Goal Start Date  04/02/16  [facility] Long Term Goal Met Date  05/06/16  Interventions for Problem One Long Term Goal  telephone follow up for assessment of community care coordination needs.  spoke with patient's son, Sarah Perkins who states patient is currently resting,  stated she is doing fine.    [facility] CM Short Term Goal #1 (0-30 days)  In the next 14 days, patient will meet with [facility] for assessment of community care needs/possible end of life care  [facility] CM Short Term Goal #1 Start Date  04/02/16  [facility] CM Short Term Goal #1 Met Date  04/09/16  Interventions for Short Term Goal #1  initial home visit, patient disclosed needing a replacement blood pressure cuff to measure her  blood pressures at home.      [facility] CM Care Plan Problem Two   Flowsheet Row Most Recent Value  Care Plan Problem Two  Patient had a recent hospitalization for COPD  Role Documenting the Problem Two  Care Management [place] for Problem Two  Active  [facility] CM Short  Term Goal #1 (0-30 days)  In the next 28 days, patient and family will meet with [facility]'s RNCM for COPD Education  [facility] CM Short Term Goal #1 Start Date  04/02/16  [facility] CM Short Term Goal #1 Met Date   04/09/16  Interventions for Short Term Goal #2   Patient and son stated they think they are ok at her age, thankful she has lived this long, has all information they need at this time.        Assessment:  Patient has met her case management goals Patient's son, Sarah Perkins, denies any further case management needs at this time. Sarah Perkins has this RNCM's contact information for assistance if case management needs arise.    Plan:  Discharge patient from caseload due to her meeting her case management goals

## 2016-11-28 DIAGNOSIS — L909 Atrophic disorder of skin, unspecified: Secondary | ICD-10-CM | POA: Diagnosis not present

## 2016-11-28 DIAGNOSIS — N183 Chronic kidney disease, stage 3 (moderate): Secondary | ICD-10-CM | POA: Diagnosis not present

## 2016-11-28 DIAGNOSIS — R609 Edema, unspecified: Secondary | ICD-10-CM | POA: Diagnosis not present

## 2016-11-28 DIAGNOSIS — K219 Gastro-esophageal reflux disease without esophagitis: Secondary | ICD-10-CM | POA: Diagnosis not present

## 2016-11-28 DIAGNOSIS — K5900 Constipation, unspecified: Secondary | ICD-10-CM | POA: Diagnosis not present

## 2016-11-28 DIAGNOSIS — I10 Essential (primary) hypertension: Secondary | ICD-10-CM | POA: Diagnosis not present

## 2016-11-28 DIAGNOSIS — M810 Age-related osteoporosis without current pathological fracture: Secondary | ICD-10-CM | POA: Diagnosis not present

## 2016-12-03 ENCOUNTER — Encounter (HOSPITAL_COMMUNITY): Payer: Self-pay | Admitting: Emergency Medicine

## 2016-12-03 ENCOUNTER — Inpatient Hospital Stay (HOSPITAL_COMMUNITY)
Admission: EM | Admit: 2016-12-03 | Discharge: 2016-12-08 | DRG: 377 | Disposition: A | Discharge status: Home Health | Payer: Medicare Other | Attending: Internal Medicine | Admitting: Internal Medicine

## 2016-12-03 DIAGNOSIS — L89312 Pressure ulcer of right buttock, stage 2: Secondary | ICD-10-CM | POA: Diagnosis present

## 2016-12-03 DIAGNOSIS — E440 Moderate protein-calorie malnutrition: Secondary | ICD-10-CM | POA: Diagnosis present

## 2016-12-03 DIAGNOSIS — I5032 Chronic diastolic (congestive) heart failure: Secondary | ICD-10-CM | POA: Diagnosis present

## 2016-12-03 DIAGNOSIS — Z66 Do not resuscitate: Secondary | ICD-10-CM | POA: Diagnosis present

## 2016-12-03 DIAGNOSIS — E43 Unspecified severe protein-calorie malnutrition: Secondary | ICD-10-CM | POA: Diagnosis present

## 2016-12-03 DIAGNOSIS — Z823 Family history of stroke: Secondary | ICD-10-CM

## 2016-12-03 DIAGNOSIS — I10 Essential (primary) hypertension: Secondary | ICD-10-CM | POA: Diagnosis not present

## 2016-12-03 DIAGNOSIS — K5793 Diverticulitis of intestine, part unspecified, without perforation or abscess with bleeding: Secondary | ICD-10-CM | POA: Diagnosis not present

## 2016-12-03 DIAGNOSIS — R4182 Altered mental status, unspecified: Secondary | ICD-10-CM | POA: Diagnosis not present

## 2016-12-03 DIAGNOSIS — N182 Chronic kidney disease, stage 2 (mild): Secondary | ICD-10-CM | POA: Diagnosis present

## 2016-12-03 DIAGNOSIS — L89321 Pressure ulcer of left buttock, stage 1: Secondary | ICD-10-CM | POA: Diagnosis present

## 2016-12-03 DIAGNOSIS — K5900 Constipation, unspecified: Secondary | ICD-10-CM | POA: Diagnosis not present

## 2016-12-03 DIAGNOSIS — L8990 Pressure ulcer of unspecified site, unspecified stage: Secondary | ICD-10-CM | POA: Diagnosis present

## 2016-12-03 DIAGNOSIS — K219 Gastro-esophageal reflux disease without esophagitis: Secondary | ICD-10-CM | POA: Diagnosis not present

## 2016-12-03 DIAGNOSIS — Z681 Body mass index (BMI) 19 or less, adult: Secondary | ICD-10-CM

## 2016-12-03 DIAGNOSIS — R031 Nonspecific low blood-pressure reading: Secondary | ICD-10-CM | POA: Diagnosis not present

## 2016-12-03 DIAGNOSIS — Z993 Dependence on wheelchair: Secondary | ICD-10-CM | POA: Diagnosis not present

## 2016-12-03 DIAGNOSIS — K625 Hemorrhage of anus and rectum: Secondary | ICD-10-CM | POA: Diagnosis not present

## 2016-12-03 DIAGNOSIS — L89159 Pressure ulcer of sacral region, unspecified stage: Secondary | ICD-10-CM | POA: Diagnosis not present

## 2016-12-03 DIAGNOSIS — F0390 Unspecified dementia without behavioral disturbance: Secondary | ICD-10-CM | POA: Diagnosis present

## 2016-12-03 DIAGNOSIS — K5791 Diverticulosis of intestine, part unspecified, without perforation or abscess with bleeding: Secondary | ICD-10-CM | POA: Diagnosis not present

## 2016-12-03 DIAGNOSIS — Z7401 Bed confinement status: Secondary | ICD-10-CM | POA: Diagnosis not present

## 2016-12-03 DIAGNOSIS — K5790 Diverticulosis of intestine, part unspecified, without perforation or abscess without bleeding: Secondary | ICD-10-CM | POA: Diagnosis present

## 2016-12-03 DIAGNOSIS — K921 Melena: Secondary | ICD-10-CM | POA: Diagnosis present

## 2016-12-03 DIAGNOSIS — R627 Adult failure to thrive: Secondary | ICD-10-CM | POA: Diagnosis not present

## 2016-12-03 DIAGNOSIS — E8809 Other disorders of plasma-protein metabolism, not elsewhere classified: Secondary | ICD-10-CM | POA: Diagnosis not present

## 2016-12-03 DIAGNOSIS — I130 Hypertensive heart and chronic kidney disease with heart failure and stage 1 through stage 4 chronic kidney disease, or unspecified chronic kidney disease: Secondary | ICD-10-CM | POA: Diagnosis present

## 2016-12-03 DIAGNOSIS — I5040 Unspecified combined systolic (congestive) and diastolic (congestive) heart failure: Secondary | ICD-10-CM | POA: Diagnosis not present

## 2016-12-03 DIAGNOSIS — K922 Gastrointestinal hemorrhage, unspecified: Secondary | ICD-10-CM | POA: Diagnosis not present

## 2016-12-03 LAB — COMPREHENSIVE METABOLIC PANEL
ALBUMIN: 2.7 g/dL — AB (ref 3.5–5.0)
ALT: 7 U/L — AB (ref 14–54)
AST: 15 U/L (ref 15–41)
Alkaline Phosphatase: 40 U/L (ref 38–126)
Anion gap: 8 (ref 5–15)
BUN: 27 mg/dL — AB (ref 6–20)
CHLORIDE: 106 mmol/L (ref 101–111)
CO2: 29 mmol/L (ref 22–32)
CREATININE: 0.97 mg/dL (ref 0.44–1.00)
Calcium: 8.6 mg/dL — ABNORMAL LOW (ref 8.9–10.3)
GFR calc Af Amer: 55 mL/min — ABNORMAL LOW (ref >60)
GFR calc non Af Amer: 48 mL/min — ABNORMAL LOW (ref >60)
Glucose, Bld: 118 mg/dL — ABNORMAL HIGH (ref 65–99)
POTASSIUM: 4 mmol/L (ref 3.5–5.1)
SODIUM: 143 mmol/L (ref 135–145)
Total Bilirubin: 0.7 mg/dL (ref 0.3–1.2)
Total Protein: 7 g/dL (ref 6.5–8.1)

## 2016-12-03 LAB — CBC
HEMATOCRIT: 27.3 % — AB (ref 36.0–46.0)
HEMATOCRIT: 23.7 % — AB (ref 36.0–46.0)
HEMATOCRIT: 20.9 % — AB (ref 36.0–46.0)
HEMOGLOBIN: 8 g/dL — AB (ref 12.0–15.0)
Hemoglobin: 9.1 g/dL — ABNORMAL LOW (ref 12.0–15.0)
Hemoglobin: 7 g/dL — ABNORMAL LOW (ref 12.0–15.0)
MCH: 31 pg (ref 26.0–34.0)
MCH: 30.7 pg (ref 26.0–34.0)
MCH: 30.4 pg (ref 26.0–34.0)
MCHC: 33.3 g/dL (ref 30.0–36.0)
MCHC: 33.8 g/dL (ref 30.0–36.0)
MCHC: 33.5 g/dL (ref 30.0–36.0)
MCV: 92.9 fL (ref 78.0–100.0)
MCV: 90.8 fL (ref 78.0–100.0)
MCV: 90.9 fL (ref 78.0–100.0)
PLATELETS: 69 10*3/uL — AB (ref 150–400)
Platelets: 132 10*3/uL — ABNORMAL LOW (ref 150–400)
Platelets: 194 10*3/uL (ref 150–400)
RBC: 2.94 MIL/uL — AB (ref 3.87–5.11)
RBC: 2.61 MIL/uL — AB (ref 3.87–5.11)
RBC: 2.3 MIL/uL — ABNORMAL LOW (ref 3.87–5.11)
RDW: 13.6 % (ref 11.5–15.5)
RDW: 13.4 % (ref 11.5–15.5)
RDW: 13.5 % (ref 11.5–15.5)
WBC: 6.8 10*3/uL (ref 4.0–10.5)
WBC: 9.1 10*3/uL (ref 4.0–10.5)
WBC: 10 10*3/uL (ref 4.0–10.5)

## 2016-12-03 LAB — PROTIME-INR
INR: 1.13
Prothrombin Time: 14.6 seconds (ref 11.4–15.2)

## 2016-12-03 MED ORDER — SODIUM CHLORIDE 0.9 % IV SOLN
INTRAVENOUS | Status: AC
  Administered 2016-12-03 – 2016-12-05 (×2): via INTRAVENOUS

## 2016-12-03 MED ORDER — PANTOPRAZOLE SODIUM 40 MG PO TBEC
40.0000 mg | DELAYED_RELEASE_TABLET | Freq: Every day | ORAL | Status: DC
  Administered 2016-12-03 – 2016-12-08 (×6): 40 mg via ORAL
  Filled 2016-12-03 (×7): qty 1

## 2016-12-03 MED ORDER — ACETAMINOPHEN 325 MG PO TABS: 650.0000 mg | ORAL_TABLET | Freq: Four times a day (QID) | ORAL | Status: DC | PRN

## 2016-12-03 NOTE — ED Triage Notes (Signed)
Per EMS son called out related to pt rectal bleeding with clots onset throughout the night; recently taken off blood thinners. Has recent femur/hip fracture. Pt denies pain.

## 2016-12-03 NOTE — ED Notes (Signed)
Unsuccessful IV attempt to left wrist. EMS attempted 3 times PTA. Jeneen Rinks, MD notified and verbalizes will attempt US guided IV.

## 2016-12-03 NOTE — H&P (Signed)
History and Physical    Sarah Perkins:[id number] DOB: [birth year] DOA: 12/03/2016  PCP: Thressa Sheller, MD Patient coming from: Home  Chief Complaint: Bright red blood per rectum  HPI: Sarah Perkins is a 83 y.o. female with medical history significant for several episodes of diverticular bleeding, GERD and duodenitis, dementia and recent femur fracture who was in her usual state of frail health living at home with her son until this morning when she was noted by her son to "have that smell of that bleeding" and was noted to have bright red blood and clots in her bed. She was brought into the emergency room where she was noted to be normotensive, mildly tachycardic and have an H&H that were unchanged from previous. Patient was noted to have some bright red blood per rectum when she initially presented but has not had any further episodes since. Of note patient's son states that he was told by GI physicians in the past that "they wouldn't do any more colonoscopies anymore" but that "it was okay to give her blood if she needed it". Patient is now admitted for hematochezia thought to be secondary to diverticulosis.   Patient's son who is her primary caregiver notes that she is DO NOT RESUSCITATE as he understands that she has lived a long life and would not survive an attempt to resuscitate her. He also notes that he is aware that if she doesn't stop bleeding on her own this might be a terminal event and he would be willing to pursue comfort care measures. However he would like to take her home if the bleeding would stop on its own and if she could get blood to support her in the meanwhile.  Patient herself is unable to provide any history due to dementia. However she does consent to allow me to examine her.   ED Course: Patient was treated with 3 boluses of normal saline 500 mL each. She was noted to be hemodynamically stable.   Ambulatory Status: Bed bound and wheelchair  bound.  Past Medical History:  Diagnosis Date  . Acid reflux    occasional  . Acute blood loss anemia 06/2009   received ~ 6 units blood  . Adenomatous polyp of colon 06/2009  . Aneurysm ([place]) 1972   cerebral  . CHF (congestive heart failure) ([place])   . Diverticulitis   . GI bleed 02/14/2014  . History of GI diverticular bleed 06/2009   colonoscopy with endo clipping of bleeding tic by Dr Clarene Essex.  Flex sig by Dr Paulita Fujita.  . Hypertension   . Tremor    worse on right arm  . UTI (lower urinary tract infection)     Past Surgical History:  Procedure Laterality Date  . CEREBRAL ANEURYSM REPAIR  1970's  . COLONOSCOPY  09/22/2011   Procedure: COLONOSCOPY;  Surgeon: Juanita Craver, MD;  Location: [facility] ENDOSCOPY;  Service: Endoscopy;  Laterality: N/A;  wants ped scope  . COLONOSCOPY W/ CONTROL OF HEMORRHAGE  06/2009  . HIP FRACTURE SURGERY  2011  . KNEE ARTHROSCOPY    . ORIF FEMUR FRACTURE Right 10/22/2015   Procedure: OPEN REDUCTION INTERNAL FIXATION (ORIF) DISTAL FEMUR FRACTURE;  Surgeon: Mcarthur Rossetti, MD;  Location: [facility];  Service: Orthopedics;  Laterality: Right;    Social History   Social History  . Marital status: Divorced    Spouse name: N/A  . Number of children: N/A  . Years of education: N/A   Occupational History  . Not  on file.   Social History Main Topics  . Smoking status: Never Smoker  . Smokeless tobacco: Never Used  . Alcohol use No  . Drug use: No  . Sexual activity: No   Other Topics Concern  . Not on file   Social History Narrative  . No narrative on file    No Known Allergies  Family History  Problem Relation Age of Onset  . Stroke Mother   . Lung disease Father   . Stroke Brother     Prior to Admission medications   Medication Sig Start Date End Date Taking? Authorizing Provider  acetaminophen (TYLENOL) 325 MG tablet Take 2 tablets (650 mg total) by mouth every 6 (six) hours as needed for mild pain or moderate pain (or Fever >/=  101). 10/26/15  Yes Domenic Polite, MD  amLODipine (NORVASC) 5 MG tablet Take 5 mg by mouth daily.   Yes [provider]  CALCIUM-MAGNESIUM-ZINC PO Take 1 capsule by mouth daily.   Yes [provider]  cyanocobalamin 100 MCG tablet Take 100 mcg by mouth daily.   Yes [provider]  docusate sodium (COLACE) 100 MG capsule Take 100 mg by mouth at bedtime.   Yes [provider]  ferrous sulfate 325 (65 FE) MG tablet Take 325 mg by mouth daily with breakfast.   Yes [provider]  furosemide (LASIX) 20 MG tablet Take 1 tablet (20 mg total) by mouth daily. 03/31/16  Yes Domenic Polite, MD  neomycin-bacitracin-polymyxin (NEOSPORIN) [phone] ointment Apply topically 4 (four) times daily as needed (sores).   Yes [provider]  pantoprazole (PROTONIX) 40 MG tablet Take 1 tablet (40 mg total) by mouth daily. 06/08/15  Yes Florencia Reasons, MD  polyethylene glycol [facility] / GLYCOLAX) packet Take 17 g by mouth every morning.   Yes [provider]  potassium chloride SA (K-DUR,KLOR-CON) 20 MEQ tablet Take 2 tablets (40 mEq total) by mouth daily. 03/31/16  Yes Domenic Polite, MD  vitamin C (ASCORBIC ACID) 500 MG tablet Take 500 mg by mouth daily.   Yes [provider]    Physical Exam: Vitals:   12/03/16 1110 12/03/16 1145 12/03/16 1215 12/03/16 1249  BP: 136/60 (!) 125/54 (!) 134/54 (!) 137/59  Pulse: 84 78 72 74  Resp: 16 16 14 15   Temp:      TempSrc:      SpO2: 95% 95% 94% 93%  Weight:      Height:         General:  Appears calm and comfortable Eyes:  Sclera are anicteric, normal lids, iris, conjunctiva without injection ENT:  Dry mucous membranes, hard of hearing Neck:  no masses or thyromegaly Cardiovascular:  RRR, tachycardic, no m/r/g. No LE edema.  Respiratory:  CTA bilaterally, no w/r/r. Normal respiratory effort. Abdomen:  soft, ntnd, NABS, not distended Skin:  Patient has a stage II decubitus ulcer on the right  buttock and a stage I ulcer on the left buttock without evidence of infection. Musculoskeletal:  Frail patient with atrophy and almost all of her muscle groups. Psychiatric:  Dementia without evidence of agitation  Neurologic:  Difficult to assess given dementia and poor muscle mass. She does not appear to be grossly focal  Labs on Admission: I have personally reviewed following labs and imaging studies  CBC:  Recent Labs Lab 12/03/16 1104  WBC 6.8  HGB 9.1*  HCT 27.3*  MCV 92.9  PLT 69*   Basic Metabolic Panel:  Recent Labs Lab 12/03/16  1104  NA 143  K 4.0  CL 106  CO2 29  GLUCOSE 118*  BUN 27*  CREATININE 0.97  CALCIUM 8.6*   GFR: Estimated Creatinine Clearance: 29.6 mL/min (by C-G formula based on SCr of 0.97 mg/dL). Liver Function Tests:  Recent Labs Lab 12/03/16 1104  AST 15  ALT 7*  ALKPHOS 40  BILITOT 0.7  PROT 7.0  ALBUMIN 2.7*   No results for input(s): LIPASE, AMYLASE in the last 168 hours. No results for input(s): AMMONIA in the last 168 hours. Coagulation Profile:  Recent Labs Lab 12/03/16 1104  INR 1.13   Cardiac Enzymes: No results for input(s): CKTOTAL, CKMB, CKMBINDEX, TROPONINI in the last 168 hours. BNP (last 3 results) No results for input(s): PROBNP in the last 8760 hours. HbA1C: No results for input(s): HGBA1C in the last 72 hours. CBG: No results for input(s): GLUCAP in the last 168 hours. Lipid Profile: No results for input(s): CHOL, HDL, LDLCALC, TRIG, CHOLHDL, LDLDIRECT in the last 72 hours. Thyroid Function Tests: No results for input(s): TSH, T4TOTAL, FREET4, T3FREE, THYROIDAB in the last 72 hours. Anemia Panel: No results for input(s): VITAMINB12, FOLATE, FERRITIN, TIBC, IRON, RETICCTPCT in the last 72 hours. Urine analysis:    Component Value Date/Time   COLORURINE YELLOW 03/27/2016 1308   APPEARANCEUR HAZY (A) 03/27/2016 1308   LABSPEC 1.020 03/27/2016 1308   PHURINE 6.5 03/27/2016 1308   GLUCOSEU NEGATIVE  03/27/2016 1308   HGBUR NEGATIVE 03/27/2016 1308   BILIRUBINUR NEGATIVE 03/27/2016 1308   KETONESUR NEGATIVE 03/27/2016 1308   PROTEINUR 30 (A) 03/27/2016 1308   UROBILINOGEN 0.2 10/08/2014 1225   NITRITE POSITIVE (A) 03/27/2016 1308   LEUKOCYTESUR MODERATE (A) 03/27/2016 1308    Creatinine Clearance: Estimated Creatinine Clearance: 29.6 mL/min (by C-G formula based on SCr of 0.97 mg/dL).  Sepsis Labs: @LABRCNTIP (procalcitonin:4,lacticidven:4) )No results found for this or any previous visit (from the past 240 hour(s)).   Radiological Exams on Admission: No results found.  Assessment/Plan Active Problems:   GERD (gastroesophageal reflux disease)   Diverticulosis   Dementia   Hematochezia    HEMATOCHEZIA Patient is hemodynamically stable and H&H are without change from 3 months ago.  She has not had another episode of hematochezia since arrival in ER.  Will follow CBC every 6 hours and transfuse as needed.  In the meantime will provide crystalloid intravascular fluid support.  Patient's son reports that GI has said that there would be no further colonoscopies needed and he is in agreement with that. Will continue with conservative management as outlined above.   DECUBITUS ULCER Wound nurse consulted.  GERD  continue pantoprazole  DEMENTIA Patient is high falls risk although she is pretty much bedbound.  Will avoid benzodiazepines or other medications that might provoke delirium. Son seems very devoted and will likely remain at bedside and orientation to place and time as needed.  Patient is DO NOT RESUSCITATE   DVT prop: No Lovenox given ongoing GI bleed, SCD written.  Code Stat: DO NOT RESUSCITATE  Family Communication: patient's son was at bedside throughout the history and physical. Disposition Plan: patient will be discharged home Consults call: None  Admission status: inpatient MedSurg   Vashti Hey MD [facility]  If 7PM-7AM, please  contact night-coverage [email or web address] Password [email or web address]  12/03/2016, 2:10 PM

## 2016-12-03 NOTE — ED Notes (Signed)
Bed: WA14 Expected date:  Expected time:  Means of arrival:  Comments: EMS-GI bleed 

## 2016-12-03 NOTE — Progress Notes (Signed)
Repeat cbc today shows hg 8.0 which is closer to patient's baseline. Patient remains hemodynamically stable. Will hold off on tranfusion for now. Repeat cbc in 6 hrs, if still decreasing or if patient becomes hemodynamically unstable, would transfuse 1 unit prbc.

## 2016-12-03 NOTE — ED Provider Notes (Addendum)
[facility] DEPT Provider Note   CSN: [id number] Arrival date & time: 12/03/16  3382     History   Chief Complaint Chief Complaint  Patient presents with  . GI Bleeding    HPI Sarah Perkins is a 83 y.o. female.Chief complaint is rectal bleeding.  HPI:  89 showed female with history of prior episodes of lower GI bleeding secondary to diffuse diverticular disease. Has a chronic iron deficiency anemia secondary to occasional/intermittent GI bleeding. Was in her normal state of health yesterday until she awakened this morning with blood in her bed clothing and sheets. Transferred here from home. She resides at home with her son who is her caregiver  Past Medical History:  Diagnosis Date  . Acid reflux    occasional  . Acute blood loss anemia 06/2009   received ~ 6 units blood  . Adenomatous polyp of colon 06/2009  . Aneurysm ([place]) 1972   cerebral  . CHF (congestive heart failure) ([place])   . Diverticulitis   . GI bleed 02/14/2014  . History of GI diverticular bleed 06/2009   colonoscopy with endo clipping of bleeding tic by Dr Clarene Essex.  Flex sig by Dr Paulita Fujita.  . Hypertension   . Tremor    worse on right arm  . UTI (lower urinary tract infection)     Patient Active Problem List   Diagnosis Date Noted  . Sepsis ([place]) 03/27/2016  . Acute respiratory failure with hypoxemia ([place]) 03/27/2016  . Closed fracture of right femur ([place])   . Malnutrition of moderate degree 10/24/2015  . Femur fracture, right ([place]) 10/21/2015  . Duodenal ulcer, acute 06/04/2015  . Essential hypertension 06/02/2015  . Syncope 06/02/2015  . Abdominal pain 06/02/2015  . Elevated lactic acid level 06/02/2015  . Faintness   . Dehydration 03/29/2015  . FTT (failure to thrive) in adult 03/29/2015  . Pressure ulcer 03/23/2015  . CKD (chronic kidney disease) stage 2, GFR 60-89 ml/min 03/23/2015  . Duodenitis without bleeding 03/22/2015  . Azotemia 03/22/2015  . Acute hypokalemia 03/22/2015  .  Constipation   . Cough   . Palliative care encounter   . Acute bronchitis 10/08/2014  . Anemia, iron deficiency 10/08/2014  . Rectal bleeding 08/21/2014  . Chronic diastolic CHF (congestive heart failure) ([place]) 02/22/2014  . GIB (gastrointestinal bleeding) 02/14/2014  . Abnormal EKG 02/14/2014  . Dementia 01/28/2013  . Acute posthemorrhagic anemia 09/20/2011  . CHF (congestive heart failure) ([place]) 05/15/2011  . Acute on chronic kidney failure ([place]), stage ii 05/15/2011  . GERD (gastroesophageal reflux disease) 05/15/2011  . Diverticulosis 05/15/2011    Past Surgical History:  Procedure Laterality Date  . CEREBRAL ANEURYSM REPAIR  1970's  . COLONOSCOPY  09/22/2011   Procedure: COLONOSCOPY;  Surgeon: Juanita Craver, MD;  Location[facility] ENDOSCOPY;  Service: Endoscopy;  Laterality: N/A;  wants ped scope  . COLONOSCOPY W/ CONTROL OF HEMORRHAGE  06/2009  . HIP FRACTURE SURGERY  2011  . KNEE ARTHROSCOPY    . ORIF FEMUR FRACTURE Right 10/22/2015   Procedure: OPEN REDUCTION INTERNAL FIXATION (ORIF) DISTAL FEMUR FRACTURE;  Surgeon: Mcarthur Rossetti, MD;  Location: [facility];  Service: Orthopedics;  Laterality: Right;    OB History    No data available       Home Medications    Prior to Admission medications   Medication Sig Start Date End Date Taking? Authorizing Provider  acetaminophen (TYLENOL) 325 MG tablet Take 2 tablets (650 mg total) by mouth every 6 (six) hours  as needed for mild pain or moderate pain (or Fever >/= 101). 10/26/15  Yes Domenic Polite, MD  amLODipine (NORVASC) 5 MG tablet Take 5 mg by mouth daily.   Yes [provider]  CALCIUM-MAGNESIUM-ZINC PO Take 1 capsule by mouth daily.   Yes [provider]  cyanocobalamin 100 MCG tablet Take 100 mcg by mouth daily.   Yes [provider]  docusate sodium (COLACE) 100 MG capsule Take 100 mg by mouth at bedtime.   Yes [provider]  ferrous sulfate 325 (65 FE) MG tablet Take 325 mg by mouth  daily with breakfast.   Yes [provider]  furosemide (LASIX) 20 MG tablet Take 1 tablet (20 mg total) by mouth daily. 03/31/16  Yes Domenic Polite, MD  naproxen sodium (ALEVE) 220 MG tablet Take 220 mg by mouth 2 (two) times daily with a meal.   Yes [provider]  neomycin-bacitracin-polymyxin (NEOSPORIN) [phone] ointment Apply topically 4 (four) times daily as needed (sores).   Yes [provider]  pantoprazole (PROTONIX) 40 MG tablet Take 1 tablet (40 mg total) by mouth daily. 06/08/15  Yes Florencia Reasons, MD  polyethylene glycol [facility] / GLYCOLAX) packet Take 17 g by mouth every morning.   Yes [provider]  potassium chloride SA (K-DUR,KLOR-CON) 20 MEQ tablet Take 2 tablets (40 mEq total) by mouth daily. 03/31/16  Yes Domenic Polite, MD  vitamin C (ASCORBIC ACID) 500 MG tablet Take 500 mg by mouth daily.   Yes [provider]  levofloxacin (LEVAQUIN) 250 MG tablet Take 1 tablet (250 mg total) by mouth daily. For 3days Patient not taking: Reported on 12/03/2016 04/01/16   Domenic Polite, MD    Family History Family History  Problem Relation Age of Onset  . Stroke Mother   . Lung disease Father   . Stroke Brother     Social History Social History  Substance Use Topics  . Smoking status: Never Smoker  . Smokeless tobacco: Never Used  . Alcohol use No     Allergies   Patient has no known allergies.   Review of Systems Review of Systems  Constitutional: Negative for appetite change, chills, diaphoresis, fatigue and fever.  HENT: Negative for mouth sores, sore throat and trouble swallowing.   Eyes: Negative for visual disturbance.  Respiratory: Negative for cough, chest tightness, shortness of breath and wheezing.   Cardiovascular: Negative for chest pain.  Gastrointestinal: Positive for anal bleeding and blood in stool. Negative for abdominal distention, abdominal pain, diarrhea, nausea and vomiting.  Endocrine: Negative for  polydipsia, polyphagia and polyuria.  Genitourinary: Negative for dysuria, frequency and hematuria.  Musculoskeletal: Negative for gait problem.  Skin: Negative for color change, pallor and rash.  Neurological: Negative for dizziness, syncope, light-headedness and headaches.  Hematological: Does not bruise/bleed easily.  Psychiatric/Behavioral: Negative for behavioral problems and confusion.     Physical Exam Updated Vital Signs BP (!) 137/59 (BP Location: Right Arm)   Pulse 74   Temp 97.6 F (36.4 C) (Oral)   Resp 15   Ht 5\' 7"  (1.702 m)   Wt 55.3 kg (122 lb)   SpO2 93%   BMI 19.11 kg/m   Physical Exam  Constitutional: She is oriented to person, place, and time. She appears well-developed and well-nourished. No distress.  HENT:  Head: Normocephalic.  Conjunctiva not pale  Eyes: Pupils are equal, round, and reactive to light. Conjunctivae are normal. No scleral icterus.  Neck: Normal range of motion. Neck supple. No  thyromegaly present.  Cardiovascular: Normal rate and regular rhythm.  Exam reveals no gallop and no friction rub.   No murmur heard. Pulmonary/Chest: Effort normal and breath sounds normal. No respiratory distress. She has no wheezes. She has no rales.  Abdominal: Soft. Bowel sounds are normal. She exhibits no distension. There is no tenderness. There is no rebound.  Soft benign abdomen.  Genitourinary:  Genitourinary Comments: Marked bright red blood per rectum  Musculoskeletal: Normal range of motion.  Neurological: She is alert and oriented to person, place, and time.  Skin: Skin is warm and dry. No rash noted.  Psychiatric: She has a normal mood and affect. Her behavior is normal.     ED Treatments / Results  Labs (all labs ordered are listed, but only abnormal results are displayed) Labs Reviewed  COMPREHENSIVE METABOLIC PANEL - Abnormal; Notable for the following:       Result Value   Glucose, Bld 118 (*)    BUN 27 (*)    Calcium 8.6 (*)     Albumin 2.7 (*)    ALT 7 (*)    GFR calc non Af Amer 48 (*)    GFR calc Af Amer 55 (*)    All other components within normal limits  CBC - Abnormal; Notable for the following:    RBC 2.94 (*)    Hemoglobin 9.1 (*)    HCT 27.3 (*)    Platelets 69 (*)    All other components within normal limits  PROTIME-INR  TYPE AND SCREEN    EKG  EKG Interpretation None       Radiology No results found.  Procedures Procedures (including critical care time)  Medications Ordered in ED Medications - No data to display   Initial Impression / Assessment and Plan / ED Course  I have reviewed the triage vital signs and the nursing notes.  Pertinent labs & imaging results that were available during my care of the patient were reviewed by me and considered in my medical decision making (see chart for details).    Hemoglobin 9.1 which is above baseline for her. Has had no additional marked leading. She remains hemolytically stable with blood pressures in the 0:76 systolic range and not tachycardic. Dementia, but level of consciousness is not altered. Discussed with hospitalist. Patient will be admitted.  Extensive discussion with the patient's son. He would not want surgery, colonoscopy, intubation, compressions, CPR or any acute lifesaving events. He is amenable to medical treatment and transfusion is necessary. We discussed the possibility of palliative care if her bleeding progresses.  Final Clinical Impressions(s) / ED Diagnoses   Final diagnoses:  Acute GI bleeding    Angiocath insertion Performed by: Lolita Patella  Consent: Verbal consent obtained. Risks and benefits: risks, benefits and alternatives were discussed Time out: Immediately prior to procedure a "time out" was called to verify the correct patient, procedure, equipment, support staff and site/side marked as required.  Preparation: Patient was prepped and draped in the usual sterile fashion.  Vein Location: LUE,  upper arm  +Ultrasound Guided  Gauge: 20g  Normal blood return and flush without difficulty Patient tolerance: Patient tolerated the procedure well with no immediate complications.     New Prescriptions New Prescriptions   No medications on file     Tanna Furry, MD 12/03/16 1315    Tanna Furry, MD 12/03/16 [phone]

## 2016-12-04 DIAGNOSIS — I10 Essential (primary) hypertension: Secondary | ICD-10-CM

## 2016-12-04 DIAGNOSIS — L8990 Pressure ulcer of unspecified site, unspecified stage: Secondary | ICD-10-CM | POA: Diagnosis present

## 2016-12-04 DIAGNOSIS — K5793 Diverticulitis of intestine, part unspecified, without perforation or abscess with bleeding: Secondary | ICD-10-CM

## 2016-12-04 DIAGNOSIS — K219 Gastro-esophageal reflux disease without esophagitis: Secondary | ICD-10-CM

## 2016-12-04 DIAGNOSIS — F0390 Unspecified dementia without behavioral disturbance: Secondary | ICD-10-CM

## 2016-12-04 DIAGNOSIS — E440 Moderate protein-calorie malnutrition: Secondary | ICD-10-CM

## 2016-12-04 DIAGNOSIS — I5032 Chronic diastolic (congestive) heart failure: Secondary | ICD-10-CM

## 2016-12-04 DIAGNOSIS — K921 Melena: Secondary | ICD-10-CM

## 2016-12-04 LAB — CBC
HCT: 24.8 % — ABNORMAL LOW (ref 36.0–46.0)
HCT: 17.9 % — ABNORMAL LOW (ref 36.0–46.0)
Hemoglobin: 8.3 g/dL — ABNORMAL LOW (ref 12.0–15.0)
Hemoglobin: 6.1 g/dL — CL (ref 12.0–15.0)
MCH: 30 pg (ref 26.0–34.0)
MCH: 29.9 pg (ref 26.0–34.0)
MCHC: 33.5 g/dL (ref 30.0–36.0)
MCHC: 34.1 g/dL (ref 30.0–36.0)
MCV: 89.5 fL (ref 78.0–100.0)
MCV: 87.7 fL (ref 78.0–100.0)
PLATELETS: 186 10*3/uL (ref 150–400)
PLATELETS: 153 10*3/uL (ref 150–400)
RBC: 2.77 MIL/uL — ABNORMAL LOW (ref 3.87–5.11)
RBC: 2.04 MIL/uL — AB (ref 3.87–5.11)
RDW: 16.3 % — AB (ref 11.5–15.5)
RDW: 16.3 % — AB (ref 11.5–15.5)
WBC: 11.4 10*3/uL — AB (ref 4.0–10.5)
WBC: 10.9 10*3/uL — AB (ref 4.0–10.5)

## 2016-12-04 LAB — PREPARE RBC (CROSSMATCH)

## 2016-12-04 LAB — BASIC METABOLIC PANEL
ANION GAP: 6 (ref 5–15)
BUN: 28 mg/dL — ABNORMAL HIGH (ref 6–20)
CALCIUM: 8.5 mg/dL — AB (ref 8.9–10.3)
CO2: 25 mmol/L (ref 22–32)
Chloride: 115 mmol/L — ABNORMAL HIGH (ref 101–111)
Creatinine, Ser: 0.87 mg/dL (ref 0.44–1.00)
GFR, EST NON AFRICAN AMERICAN: 54 mL/min — AB (ref >60)
Glucose, Bld: 97 mg/dL (ref 65–99)
Potassium: 4.2 mmol/L (ref 3.5–5.1)
Sodium: 146 mmol/L — ABNORMAL HIGH (ref 135–145)

## 2016-12-04 MED ORDER — SODIUM CHLORIDE 0.9 % IV SOLN
Freq: Once | INTRAVENOUS | Status: AC
  Administered 2016-12-04: via INTRAVENOUS

## 2016-12-04 MED ORDER — SODIUM CHLORIDE 0.9 % IV SOLN
Freq: Once | INTRAVENOUS | Status: AC
  Administered 2016-12-04: 07:00:00 via INTRAVENOUS

## 2016-12-04 MED ORDER — HYDROCERIN EX CREA
TOPICAL_CREAM | Freq: Two times a day (BID) | CUTANEOUS | Status: DC
  Administered 2016-12-04 – 2016-12-07 (×7): via TOPICAL
  Administered 2016-12-07: 1 via TOPICAL
  Administered 2016-12-08: 10:00:00 via TOPICAL
  Filled 2016-12-04 (×2): qty 113

## 2016-12-04 NOTE — Consult Note (Signed)
[facility] Nurse wound consult note Reason for Consult:buttocks and sacral wounds Wound type:stage II, MASD, DTI, partial thickness shear injury Pressure Injury POA: Yes Measurement: reddened area measures 8cm x 7cm in sacral area with 4cm x 3cm shear injury on left buttock, and a 1cm circle stage II in center of gluteal cleft, pink wound bed with slight bleeding noted, DTI on right buttock.  Heels are boggy with dry skin, lower legs with dry peeling skin. Bilateral groins have MASD.  Wound bed: see above Drainage (amount, consistency, odor) see above Periwound:see above Dressing procedure/placement/frequency: I have ordered foam dressing for sacral area to be peeled and wound looked at BID, dressing changed 2x week. Frequent turning and only use Derma Therapy linen next to pt's skin.  I have ordered Eucerin Cream for lower legs and feet as well as Prevalon Boots. I have also ordered Interdry as pt has skin damage in bilateral groins. Patient could benefit from a Nutritional Consult, please order if you agree. We will not follow, but will remain available to this patient, to nursing, and the medical and/or surgical teams. Please re-consult if we need to assist further.   Fara Olden, RN-C, WTA-C [facility]'

## 2016-12-04 NOTE — Progress Notes (Signed)
CRITICAL VALUE ALERT  Critical Value:  Hemoglobin 6.1  Date & Time Notied:  2330 12/04/2016  Provider Notified: Hal Hope   Orders Received/Actions taken: 2 units PRBC

## 2016-12-04 NOTE — Progress Notes (Signed)
Call placed to son Arieonna Medine at home contact number for consent for patient to receive PRBCs.  Consent witnessed on phone by second nurse.

## 2016-12-04 NOTE — Progress Notes (Signed)
PROGRESS NOTE    Sarah Perkins  JSH:[id number] DOB: 14-Jun-1917 DOA: 12/03/2016 PCP: Thressa Sheller, MD   Brief Narrative:  Patient is a 56 showed female history of multiple episodes of diverticular bleed in the past, gastroesophageal reflux disease and duodenitis, dementia, hypertension presenting to the ED with bright red blood per rectum.   Assessment & Plan:   Principal Problem:   GIB (gastrointestinal bleeding) Active Problems:   Hematochezia   GERD (gastroesophageal reflux disease)   Diverticulosis   Dementia   Chronic diastolic CHF (congestive heart failure) (HCC)   Constipation   CKD (chronic kidney disease) stage 2, GFR 60-89 ml/min   FTT (failure to thrive) in adult   Essential hypertension   Malnutrition of moderate degree   Pressure injury of skin  #1 hematochezia/probable lower GI bleed Patient presented with bright red blood per rectum likely lower GI bleed felt likely to be a diverticular bleed. Patient noted to have multiple bloody bowel movements overnight. No further bloody bowel movements today. Patient status post 1 unit packed red blood cells 12/03/2016. Hemoglobin currently at 8.3. Continue IV fluids. Supportive care. Place on clear liquids. Follow H&H.  #2 gastroesophageal reflux disease PPI.  #3 pressure injury to the skin Wound care.  #4 dementia Stable. Follow.  #5 hypertension Hold antihypertensive medications for now.  #6 chronic diastolic heart failure Diuretics on hold. Follow. Monitor closely for volume overload.  #7 failure to thrive/moderate malnutrition Consulting dietitian once tolerating oral intake and bleeding is resolved.   DVT prophylaxis: SCDs Code Status: DO NOT RESUSCITATE Family Communication: No family at bedside. Disposition Plan: Pending PT evaluation likely home once lower GI bleed has resolved.   Consultants:   None  Procedures:   1 unit packed red blood cells 12/03/2016  Antimicrobials:    None   Subjective: Patient laying in bed easily arousable. Denies chest pain. No shortness of breath. Patient is not sure what she had any further bloody bowel movements. Per nursing patient with bloody bowel movements overnight but none this morning.  Objective: Vitals:   12/04/16 0256 12/04/16 0323 12/04/16 0630 12/04/16 1345  BP: (!) 125/45 (!) 118/56 (!) 143/56 (!) 156/61  Pulse: 90 91 97 (!) 102  Resp: 18 18 18 20   Temp: 98.5 F (36.9 C) 98.5 F (36.9 C) (!) 97.3 F (36.3 C) 98.4 F (36.9 C)  TempSrc: Oral Oral Oral Oral  SpO2: 93% 94% 94% 95%  Weight:      Height:        Intake/Output Summary (Last 24 hours) at 12/04/16 2130 Last data filed at 12/04/16 1800  Gross per 24 hour  Intake              180 ml  Output                0 ml  Net              180 ml   Filed Weights   12/03/16 0954 12/03/16 1625  Weight: 55.3 kg (122 lb) 55.3 kg (122 lb)    Examination:  General exam: Appears calm and comfortable.Mucous membranes extremely dry.  Respiratory system: Clear to auscultation Anterior lung fields.Marland Kitchen Respiratory effort normal. Cardiovascular system: S1 & S2 heard, RRR. No JVD, murmurs, rubs, gallops or clicks. No pedal edema. Gastrointestinal system: Abdomen is nondistended, soft and nontender. No organomegaly or masses felt. Normal bowel sounds heard. Central nervous system: Alert and oriented. No focal neurological deficits. Extremities: Symmetric 5 x 5 power.  Skin: No rashes, lesions or ulcers Psychiatry: Judgement and insight appear poor. Mood & affect flat.     Data Reviewed: I have personally reviewed following labs and imaging studies  CBC:  Recent Labs Lab 12/03/16 1104 12/03/16 1436 12/03/16 2202 12/04/16 1204  WBC 6.8 9.1 10.0 11.4*  HGB 9.1* 8.0* 7.0* 8.3*  HCT 27.3* 23.7* 20.9* 24.8*  MCV 92.9 90.8 90.9 89.5  PLT 69* 132* 194 224   Basic Metabolic Panel:  Recent Labs Lab 12/03/16 1104 12/04/16 1204  NA 143 146*  K 4.0 4.2  CL  106 115*  CO2 29 25  GLUCOSE 118* 97  BUN 27* 28*  CREATININE 0.97 0.87  CALCIUM 8.6* 8.5*   GFR: Estimated Creatinine Clearance: 33 mL/min (by C-G formula based on SCr of 0.87 mg/dL). Liver Function Tests:  Recent Labs Lab 12/03/16 1104  AST 15  ALT 7*  ALKPHOS 40  BILITOT 0.7  PROT 7.0  ALBUMIN 2.7*   No results for input(s): LIPASE, AMYLASE in the last 168 hours. No results for input(s): AMMONIA in the last 168 hours. Coagulation Profile:  Recent Labs Lab 12/03/16 1104  INR 1.13   Cardiac Enzymes: No results for input(s): CKTOTAL, CKMB, CKMBINDEX, TROPONINI in the last 168 hours. BNP (last 3 results) No results for input(s): PROBNP in the last 8760 hours. HbA1C: No results for input(s): HGBA1C in the last 72 hours. CBG: No results for input(s): GLUCAP in the last 168 hours. Lipid Profile: No results for input(s): CHOL, HDL, LDLCALC, TRIG, CHOLHDL, LDLDIRECT in the last 72 hours. Thyroid Function Tests: No results for input(s): TSH, T4TOTAL, FREET4, T3FREE, THYROIDAB in the last 72 hours. Anemia Panel: No results for input(s): VITAMINB12, FOLATE, FERRITIN, TIBC, IRON, RETICCTPCT in the last 72 hours. Sepsis Labs: No results for input(s): PROCALCITON, LATICACIDVEN in the last 168 hours.  No results found for this or any previous visit (from the past 240 hour(s)).       Radiology Studies: No results found.      Scheduled Meds: . hydrocerin   Topical BID  . pantoprazole  40 mg Oral Daily   Continuous Infusions: . sodium chloride 75 mL/hr at 12/03/16 1651     LOS: 1 day    Time spent: 31 minutes    Dominigue Gellner, MD [facility] Pager [phone]  If 7PM-7AM, please contact night-coverage [email or web address] Password [email or web address] 12/04/2016, 9:30 PM

## 2016-12-04 NOTE — Progress Notes (Signed)
CM consult for possible home health needs. PT evaluation ordered. CM will await PT recommendations and assist with DC plan as needed. Marney Doctor RN,BSN,NCM [phone]

## 2016-12-05 DIAGNOSIS — K5900 Constipation, unspecified: Secondary | ICD-10-CM

## 2016-12-05 DIAGNOSIS — L89159 Pressure ulcer of sacral region, unspecified stage: Secondary | ICD-10-CM

## 2016-12-05 DIAGNOSIS — R627 Adult failure to thrive: Secondary | ICD-10-CM

## 2016-12-05 LAB — CBC
HCT: 19.8 % — ABNORMAL LOW (ref 36.0–46.0)
HEMOGLOBIN: 6.7 g/dL — AB (ref 12.0–15.0)
MCH: 30.6 pg (ref 26.0–34.0)
MCHC: 33.8 g/dL (ref 30.0–36.0)
MCV: 90.4 fL (ref 78.0–100.0)
Platelets: 165 10*3/uL (ref 150–400)
RBC: 2.19 MIL/uL — AB (ref 3.87–5.11)
RDW: 16.4 % — ABNORMAL HIGH (ref 11.5–15.5)
WBC: 13 10*3/uL — ABNORMAL HIGH (ref 4.0–10.5)

## 2016-12-05 LAB — MAGNESIUM: MAGNESIUM: 2 mg/dL (ref 1.7–2.4)

## 2016-12-05 LAB — BASIC METABOLIC PANEL
ANION GAP: 7 (ref 5–15)
BUN: 23 mg/dL — ABNORMAL HIGH (ref 6–20)
CHLORIDE: 112 mmol/L — AB (ref 101–111)
CO2: 25 mmol/L (ref 22–32)
CREATININE: 0.82 mg/dL (ref 0.44–1.00)
Calcium: 8.4 mg/dL — ABNORMAL LOW (ref 8.9–10.3)
GFR calc non Af Amer: 59 mL/min — ABNORMAL LOW (ref >60)
Glucose, Bld: 125 mg/dL — ABNORMAL HIGH (ref 65–99)
POTASSIUM: 4 mmol/L (ref 3.5–5.1)
Sodium: 144 mmol/L (ref 135–145)

## 2016-12-05 LAB — PREPARE RBC (CROSSMATCH)

## 2016-12-05 MED ORDER — ACETAMINOPHEN 325 MG PO TABS
650.0000 mg | ORAL_TABLET | Freq: Once | ORAL | Status: AC
  Administered 2016-12-05: 650 mg via ORAL
  Filled 2016-12-05: qty 2

## 2016-12-05 MED ORDER — ADULT MULTIVITAMIN LIQUID CH
15.0000 mL | Freq: Every day | ORAL | Status: DC
  Administered 2016-12-05 – 2016-12-08 (×4): 15 mL via ORAL
  Filled 2016-12-05 (×4): qty 15

## 2016-12-05 MED ORDER — FUROSEMIDE 10 MG/ML IJ SOLN
20.0000 mg | Freq: Once | INTRAMUSCULAR | Status: AC
  Administered 2016-12-05: 20 mg via INTRAVENOUS
  Filled 2016-12-05: qty 2

## 2016-12-05 MED ORDER — BOOST / RESOURCE BREEZE PO LIQD
1.0000 | Freq: Three times a day (TID) | ORAL | Status: DC
  Administered 2016-12-05 – 2016-12-07 (×6): 1 via ORAL
  Administered 2016-12-08: 10:00:00 via ORAL

## 2016-12-05 MED ORDER — SODIUM CHLORIDE 0.9 % IV SOLN
Freq: Once | INTRAVENOUS | Status: AC
  Administered 2016-12-05: 15:00:00 via INTRAVENOUS

## 2016-12-05 MED ORDER — DIPHENHYDRAMINE HCL 25 MG PO CAPS
25.0000 mg | ORAL_CAPSULE | Freq: Once | ORAL | Status: AC
  Administered 2016-12-05: 25 mg via ORAL
  Filled 2016-12-05: qty 1

## 2016-12-05 NOTE — Progress Notes (Signed)
OT Cancellation Note  Patient Details Name: NICKOLETTE ESPINOLA MRN: [id number] DOB: May 30, 1917   Cancelled Treatment:    Reason Eval/Treat Not Completed: Other (comment). Pt just finished blood.  RN checking on new bruising on arm. Will check back.  Samanatha Brammer 12/05/2016, 8:23 AM  Lesle Chris, OTR/L [phone] 12/05/2016

## 2016-12-05 NOTE — Progress Notes (Signed)
Initial Nutrition Assessment  DOCUMENTATION CODES:   Severe malnutrition in context of chronic illness  INTERVENTION:   -Provide Boost Breeze po TID, each supplement provides 250 kcal and 9 grams of protein -Once diet advanced, provide Ensure Enlive po BID, each supplement provides 350 kcal and 20 grams of protein -Provide Magic cup TID with meals, each supplement provides 290 kcal and 9 grams of protein  RD will continue to monitor  NUTRITION DIAGNOSIS:   Malnutrition(severe) related to chronic illness (dementia, diverticulosis) as evidenced by energy intake < or equal to 75% for > or equal to 1 month, severe depletion of body fat, severe depletion of muscle mass.  GOAL:   Patient will meet greater than or equal to 90% of their needs  MONITOR:   PO intake, Supplement acceptance, Labs, Weight trends, Skin, I & O's  REASON FOR ASSESSMENT:   Consult Assessment of nutrition requirement/status  ASSESSMENT:   78 showed female history of multiple episodes of diverticular bleed in the past, gastroesophageal reflux disease and duodenitis, dementia, hypertension presenting to the ED with bright red blood per rectum.  Pt in room sleeping with son at bedside. Pt with dementia and unable to provide any history. Pt's son states the patient doesn't typically eat much at mealtimes. The patient has had trouble chewing her foods and some difficulty swallowing. The son has been cutting up her meats. She mainly consumes frozen meals.   SLP evaluated today and found cognitive based dysphagia. Recommended dysphagia 1 diet with thin liquids.  Pt ate some grits and ice cream for breakfast this morning. Her diet has been downgraded from a full liquid to clear liquid diet. Will order Boost Breeze while on clears and provide Ensure once diet is advanced. Pt's son also requests the patient have [facility] as she has liked these in the past.  Per chart review, pt's weight has remained 122 lb since  December 2017. Pt is wheelchair bound. Pt's son states she has c/o increased weakness d/t poor appetite. Nutrition-Focused physical exam completed. Findings are severe  fat depletion, severe muscle depletion, and severe edema.   Labs reviewed. Medications: IV Lasix once, Protonix tablet daily  Diet Order:  Diet clear liquid Room service appropriate? Yes; Fluid consistency: Thin  Skin:  Wound (see comment) (DTI on buttocks, Pressure injuries: Stage II on sacrum, Stage I on buttocks)  Last BM:  8/8  Height:   Ht Readings from Last 1 Encounters:  12/03/16 5\' 7"  (1.702 m)    Weight:   Wt Readings from Last 1 Encounters:  12/03/16 122 lb (55.3 kg)    Ideal Body Weight:     BMI:  Body mass index is 19.11 kg/m.  Estimated Nutritional Needs:   Kcal:  1250-1450  Protein:  65-75g  Fluid:  1.5L/day  EDUCATION NEEDS:   Education needs addressed  Clayton Bibles, MS, RD, LDN Pager: [phone] After Hours Pager: [phone]

## 2016-12-05 NOTE — Progress Notes (Signed)
PROGRESS NOTE    Sarah Perkins  LEX:[id number] DOB: Nov 20, 1917 DOA: 12/03/2016 PCP: Thressa Sheller, MD   Brief Narrative:  Patient is a 23 showed female history of multiple episodes of diverticular bleed in the past, gastroesophageal reflux disease and duodenitis, dementia, hypertension presenting to the ED with bright red blood per rectum.   Assessment & Plan:   Principal Problem:   GIB (gastrointestinal bleeding) Active Problems:   Hematochezia   GERD (gastroesophageal reflux disease)   Diverticulosis   Dementia   Chronic diastolic CHF (congestive heart failure) (HCC)   Constipation   CKD (chronic kidney disease) stage 2, GFR 60-89 ml/min   FTT (failure to thrive) in adult   Essential hypertension   Malnutrition of moderate degree   Pressure injury of skin  #1 hematochezia/probable lower GI bleed Patient presented with bright red blood per rectum likely lower GI bleed felt likely to be a diverticular bleed. Patient noted to have multiple bloody bowel movements overnight. Patient was more melanotic stool today. Patient status post 2 units packed red blood cells. Patient was to receive another unit however IV site infiltrated. Repeat H&H with a hemoglobin of 6.7. Transfuse 2 units packed red blood cells. Placed back on clear liquids for now. IV fluids. Supportive care.  #2 gastroesophageal reflux disease PPI.  #3 pressure injury to the skin Wound care.  #4 dementia Stable. Follow.  #5 hypertension Hold antihypertensive medications for now.  #6 chronic diastolic heart failure Diuretics on hold. Follow. Monitor closely for volume overload.  #7 failure to thrive/moderate malnutrition Consulted dietitian once tolerating oral intake and bleeding is resolved.   DVT prophylaxis: SCDs Code Status: DO NOT RESUSCITATE Family Communication: No family at bedside. Disposition Plan: Pending PT evaluation likely home once lower GI bleed has resolved.   Consultants:     None  Procedures:   1 unit packed red blood cells 12/03/2016  1 unit packed red blood cells 12/04/2016  Antimicrobials:   None   Subjective: Patient more alert today. Denies chest pain. No shortness of breath. Per nursing patient with bloody bowel movement overnight. Patient with a little bloody bowel movement today. Patient was transfused one unit of packed red blood cells overnight. During transfusion of second unit IV site infiltrated.   Objective: Vitals:   12/05/16 0545 12/05/16 0758 12/05/16 1434 12/05/16 1504  BP: 140/65 (!) 115/44 (!) 147/62 (!) 131/58  Pulse: 89 75 87   Resp: 18 16  16   Temp: (!) 97.5 F (36.4 C) 97.7 F (36.5 C) 98.3 F (36.8 C) 98.3 F (36.8 C)  TempSrc: Oral Oral Oral Oral  SpO2: 96% 95% 97% 97%  Weight:      Height:        Intake/Output Summary (Last 24 hours) at 12/05/16 1716 Last data filed at 12/05/16 0758  Gross per 24 hour  Intake          4189.25 ml  Output                0 ml  Net          4189.25 ml   Filed Weights   12/03/16 0954 12/03/16 1625  Weight: 55.3 kg (122 lb) 55.3 kg (122 lb)    Examination:  General exam: Appears calm and comfortable.Mucous membranes dry.  Respiratory system: Clear to auscultation Bilaterally.Marland Kitchen Respiratory effort normal. Cardiovascular system: S1 & S2 heard, RRR. No JVD, murmurs, rubs, gallops or clicks. No pedal edema. Gastrointestinal system: Abdomen is nondistended, soft and nontender.  No organomegaly or masses felt. Normal bowel sounds heard. Central nervous system: Alert and oriented. No focal neurological deficits. Extremities: Symmetric 5 x 5 power. Left upper extremity swelling secondary to infiltration of IV site. Skin: Sacral pressure injury. Psychiatry: Judgement and insight appear poor. Mood & affect flat.     Data Reviewed: I have personally reviewed following labs and imaging studies  CBC:  Recent Labs Lab 12/03/16 1436 12/03/16 2202 12/04/16 1204 12/04/16 2255  12/05/16 0948  WBC 9.1 10.0 11.4* 10.9* 13.0*  HGB 8.0* 7.0* 8.3* 6.1* 6.7*  HCT 23.7* 20.9* 24.8* 17.9* 19.8*  MCV 90.8 90.9 89.5 87.7 90.4  PLT 132* 194 186 153 680   Basic Metabolic Panel:  Recent Labs Lab 12/03/16 1104 12/04/16 1204 12/05/16 0948  NA 143 146* 144  K 4.0 4.2 4.0  CL 106 115* 112*  CO2 29 25 25   GLUCOSE 118* 97 125*  BUN 27* 28* 23*  CREATININE 0.97 0.87 0.82  CALCIUM 8.6* 8.5* 8.4*  MG  --   --  2.0   GFR: Estimated Creatinine Clearance: 35 mL/min (by C-G formula based on SCr of 0.82 mg/dL). Liver Function Tests:  Recent Labs Lab 12/03/16 1104  AST 15  ALT 7*  ALKPHOS 40  BILITOT 0.7  PROT 7.0  ALBUMIN 2.7*   No results for input(s): LIPASE, AMYLASE in the last 168 hours. No results for input(s): AMMONIA in the last 168 hours. Coagulation Profile:  Recent Labs Lab 12/03/16 1104  INR 1.13   Cardiac Enzymes: No results for input(s): CKTOTAL, CKMB, CKMBINDEX, TROPONINI in the last 168 hours. BNP (last 3 results) No results for input(s): PROBNP in the last 8760 hours. HbA1C: No results for input(s): HGBA1C in the last 72 hours. CBG: No results for input(s): GLUCAP in the last 168 hours. Lipid Profile: No results for input(s): CHOL, HDL, LDLCALC, TRIG, CHOLHDL, LDLDIRECT in the last 72 hours. Thyroid Function Tests: No results for input(s): TSH, T4TOTAL, FREET4, T3FREE, THYROIDAB in the last 72 hours. Anemia Panel: No results for input(s): VITAMINB12, FOLATE, FERRITIN, TIBC, IRON, RETICCTPCT in the last 72 hours. Sepsis Labs: No results for input(s): PROCALCITON, LATICACIDVEN in the last 168 hours.  No results found for this or any previous visit (from the past 240 hour(s)).       Radiology Studies: No results found.      Scheduled Meds: . feeding supplement  1 Container Oral TID BM  . furosemide  20 mg Intravenous Once  . hydrocerin   Topical BID  . multivitamin  15 mL Oral Daily  . pantoprazole  40 mg Oral Daily    Continuous Infusions:    LOS: 2 days    Time spent: 35 minutes    THOMPSON,DANIEL, MD [facility] Pager [phone]  If 7PM-7AM, please contact night-coverage [email or web address] Password [email or web address] 12/05/2016, 5:16 PM

## 2016-12-05 NOTE — Progress Notes (Signed)
Nurse at bedside to assess patient and IV noted to be leaking with infiltration noted to left upper arm. Left upper arm is edematous and purple warm to touch with palpable pulse and blood infusing. Blood is on the last 10cc's of administration. Blood is stopped line flushed and IV removed. Restricted arm band has been placed at this time. Dr. Grandville Silos made aware of the infiltration IV team on unit assessed the IV site and has no further recommendations at this time. Dressing is dry and intact and extremity is elevated. Will continue to monitor.

## 2016-12-05 NOTE — Progress Notes (Signed)
PT Cancellation Note  Patient Details Name: KATELEN LUEPKE MRN: [id number] DOB: 12-23-1917   Cancelled Treatment:    Reason Eval/Treat Not Completed: Medical issues which prohibited therapy. Blood finishing, bruise on arm being addressed by RN.   Claretha Cooper 12/05/2016, 10:58 AM Tresa Endo PT [phone]

## 2016-12-05 NOTE — Progress Notes (Signed)
PT Cancellation Note  Patient Details Name: Sarah Perkins MRN: [id number] DOB: 1917-12-04   Cancelled Treatment:    Reason Eval/Treat Not Completed: Medical issues which prohibited therapy, HGB remains low after receiving blood products. Will check back tomorrow.   Claretha Cooper 12/05/2016, 2:18 PM Tresa Endo PT [phone]

## 2016-12-05 NOTE — Progress Notes (Signed)
Called lab to ask if blood was ready. They said they had not seen the order and will call back when it is ready.

## 2016-12-05 NOTE — Progress Notes (Signed)
OT Cancellation Note  Patient Details Name: Sarah Perkins MRN: [id number] DOB: 22-Feb-1918   Cancelled Treatment:    Reason Eval/Treat Not Completed: Medical issues which prohibited therapy.  Pt's Hgb 6.7. Will check back tomorrow.  Derriana Oser 12/05/2016, 12:43 PM  Lesle Chris, OTR/L [phone] 12/05/2016

## 2016-12-05 NOTE — Evaluation (Signed)
Clinical/Bedside Swallow Evaluation Patient Details  Name: Sarah Perkins MRN: [id number] Date of Birth: March 05, 1918  Today's Date: 12/05/2016 Time: SLP Start Time (ACUTE ONLY): 0859 SLP Stop Time (ACUTE ONLY): 0935 SLP Time Calculation (min) (ACUTE ONLY): 36 min  Past Medical History:  Past Medical History:  Diagnosis Date  . Acid reflux    occasional  . Acute blood loss anemia 06/2009   received ~ 6 units blood  . Adenomatous polyp of colon 06/2009  . Aneurysm ([place]) 1972   cerebral  . CHF (congestive heart failure) ([place])   . Diverticulitis   . GI bleed 02/14/2014  . History of GI diverticular bleed 06/2009   colonoscopy with endo clipping of bleeding tic by Dr Clarene Essex.  Flex sig by Dr Paulita Fujita.  . Hypertension   . Tremor    worse on right arm  . UTI (lower urinary tract infection)    Past Surgical History:  Past Surgical History:  Procedure Laterality Date  . CEREBRAL ANEURYSM REPAIR  1970's  . COLONOSCOPY  09/22/2011   Procedure: COLONOSCOPY;  Surgeon: Juanita Craver, MD;  Location[facility] ENDOSCOPY;  Service: Endoscopy;  Laterality: N/A;  wants ped scope  . COLONOSCOPY W/ CONTROL OF HEMORRHAGE  06/2009  . HIP FRACTURE SURGERY  2011  . KNEE ARTHROSCOPY    . ORIF FEMUR FRACTURE Right 10/22/2015   Procedure: OPEN REDUCTION INTERNAL FIXATION (ORIF) DISTAL FEMUR FRACTURE;  Surgeon: Mcarthur Rossetti, MD;  Location: [facility];  Service: Orthopedics;  Laterality: Right;   HPI:  83 yo female adm to [facility] with GI bleed.  PMH + for dementia, GERD, HTN, tremor in right arm, diverticular bleeding, HTN.  Pt was placed on clear liquids-now full liquid diet.  PMH + for dysphagia - cognitive related evidenced as prolonged mastication with recommendation for either dys3/dys2 diet with thin liquids.  RN reports pt tolerating po medications well.     Assessment / Plan / Recommendation Clinical Impression  Pt presents with cognitive based dysphagia with decreased oral awareness.  Pt with dried  secretions retained in oral cavity requiring oral care to clear.  Delayed oral transiting, oral swishing with all consistencies but no indication of pharyngeal deficits.  Pt only consumed a few bites/sips stating "that's enough".  Recommend continue full liquid diet - will follow up for solid advancement recommendations when pt appropriate.  Son arrived to room at end of session and was educated to findings/recommendations.  He does state pt normally feeds herself at home but he has been helping her since hospitalization.  Of note, pt also has h/o reflux and recommend strict precautions.   SLP Visit Diagnosis: Dysphagia, oral phase (R13.11)    Aspiration Risk  Mild aspiration risk    Diet Recommendation Dysphagia 1 (Puree);Thin liquid   Liquid Administration via: Straw;Cup Medication Administration: Other (Comment) (as tolerated) Supervision: Staff to assist with self feeding;Full supervision/cueing for compensatory strategies Compensations: Slow rate;Small sips/bites Postural Changes: Seated upright at 90 degrees;Remain upright for at least 30 minutes after po intake (as able with wound)    Other  Recommendations Oral Care Recommendations: Oral care BID   Follow up Recommendations  (tbd, doubtful follow up indicated)      Frequency and Duration min 1 x/week  1 week;2 weeks       Prognosis Prognosis for Safe Diet Advancement: Good      Swallow Study   General Date of Onset: 12/05/16 HPI: 83 yo female adm to [facility] with GI bleed.  PMH +  for dementia, GERD, HTN, tremor in right arm, diverticular bleeding, HTN.  Pt was placed on clear liquids-now full liquid diet.  PMH + for dysphagia - cognitive related evidenced as prolonged mastication with recommendation for either dys3/dys2 diet with thin liquids.  RN reports pt tolerating po medications well.   Type of Study: Bedside Swallow Evaluation Diet Prior to this Study: Thin liquids Temperature Spikes Noted: No Respiratory Status: Room  air History of Recent Intubation: No Behavior/Cognition: Alert;Cooperative Oral Cavity Assessment: Dry;Dried secretions (dried secretions adhered to soft palate, greenish tinged - used oral suction to clean) Oral Care Completed by SLP: Yes Oral Cavity - Dentition: Other (Comment);Missing dentition;Poor condition Vision: Impaired for self-feeding Self-Feeding Abilities: Needs assist Patient Positioning: Upright in bed Baseline Vocal Quality: Low vocal intensity Volitional Cough: Weak Volitional Swallow: Unable to elicit    Oral/Motor/Sensory Function Overall Oral Motor/Sensory Function: Mild impairment Facial Symmetry: Abnormal symmetry left Velum: Impaired left (deviates to right upon phonation)   Ice Chips Ice chips: Not tested   Thin Liquid Thin Liquid: Impaired Presentation: Straw;Cup Oral Phase Impairments: Reduced lingual movement/coordination Oral Phase Functional Implications: Oral holding;Other (comment);Prolonged oral transit (swishing and swallowing - delayed oral transiting) Pharyngeal  Phase Impairments: Suspected delayed Swallow    Nectar Thick Nectar Thick Liquid: Impaired Presentation: Straw;Self Fed Oral phase functional implications: Prolonged oral transit;Oral holding (swishing and swallowing - delayed oral transiting)   Honey Thick Honey Thick Liquid: Not tested   Puree Puree: Impaired Presentation: Spoon;Self Fed Oral Phase Functional Implications: Prolonged oral transit (swishing and swallowing - delayed oral transiting)   Solid   GO   Solid: Not tested        Macario Golds 12/05/2016,11:06 AM   Luanna Salk, [facility] SLP [phone]

## 2016-12-06 DIAGNOSIS — K922 Gastrointestinal hemorrhage, unspecified: Secondary | ICD-10-CM

## 2016-12-06 DIAGNOSIS — N182 Chronic kidney disease, stage 2 (mild): Secondary | ICD-10-CM

## 2016-12-06 LAB — URINALYSIS, ROUTINE W REFLEX MICROSCOPIC
BILIRUBIN URINE: NEGATIVE
Glucose, UA: NEGATIVE mg/dL
KETONES UR: NEGATIVE mg/dL
LEUKOCYTES UA: NEGATIVE
NITRITE: NEGATIVE
PH: 5 (ref 5.0–8.0)
Protein, ur: NEGATIVE mg/dL
Specific Gravity, Urine: 1.01 (ref 1.005–1.030)

## 2016-12-06 LAB — BPAM RBC
BLOOD PRODUCT EXPIRATION DATE: 201808312359
BLOOD PRODUCT EXPIRATION DATE: 201809052359
Blood Product Expiration Date: 201808252359
Blood Product Expiration Date: 201808312359
Blood Product Expiration Date: 201808312359
ISSUE DATE / TIME: 201808090131
ISSUE DATE / TIME: 201808090517
ISSUE DATE / TIME: 201808092034
ISSUE DATE / TIME: 201808080242
ISSUE DATE / TIME: 201808091436
UNIT TYPE AND RH: 5100
Unit Type and Rh: 5100
Unit Type and Rh: 5100
Unit Type and Rh: 5100
Unit Type and Rh: 9500

## 2016-12-06 LAB — TYPE AND SCREEN
ABO/RH(D): B POS
Antibody Screen: NEGATIVE
DONOR AG TYPE: NEGATIVE
DONOR AG TYPE: NEGATIVE
Donor AG Type: NEGATIVE
Donor AG Type: NEGATIVE
Donor AG Type: NEGATIVE
UNIT DIVISION: 0
Unit division: 0
Unit division: 0
Unit division: 0
Unit division: 0

## 2016-12-06 LAB — CBC
HCT: 25 % — ABNORMAL LOW (ref 36.0–46.0)
Hemoglobin: 8.5 g/dL — ABNORMAL LOW (ref 12.0–15.0)
MCH: 29.5 pg (ref 26.0–34.0)
MCHC: 34 g/dL (ref 30.0–36.0)
MCV: 86.8 fL (ref 78.0–100.0)
PLATELETS: 151 10*3/uL (ref 150–400)
RBC: 2.88 MIL/uL — AB (ref 3.87–5.11)
RDW: 16 % — AB (ref 11.5–15.5)
WBC: 11.7 10*3/uL — AB (ref 4.0–10.5)

## 2016-12-06 LAB — BASIC METABOLIC PANEL
Anion gap: 6 (ref 5–15)
BUN: 20 mg/dL (ref 6–20)
CALCIUM: 8 mg/dL — AB (ref 8.9–10.3)
CO2: 26 mmol/L (ref 22–32)
CREATININE: 0.88 mg/dL (ref 0.44–1.00)
Chloride: 109 mmol/L (ref 101–111)
GFR calc Af Amer: >60 mL/min (ref >60)
GFR, EST NON AFRICAN AMERICAN: 54 mL/min — AB (ref >60)
Glucose, Bld: 94 mg/dL (ref 65–99)
POTASSIUM: 3.2 mmol/L — AB (ref 3.5–5.1)
SODIUM: 141 mmol/L (ref 135–145)

## 2016-12-06 MED ORDER — POTASSIUM CHLORIDE 20 MEQ/15ML (10%) PO SOLN
40.0000 meq | ORAL | Status: AC
  Administered 2016-12-06: 15 meq via ORAL
  Administered 2016-12-06: 40 meq via ORAL
  Filled 2016-12-06 (×2): qty 30

## 2016-12-06 MED ORDER — POTASSIUM CHLORIDE CRYS ER 20 MEQ PO TBCR
20.0000 meq | EXTENDED_RELEASE_TABLET | Freq: Once | ORAL | Status: AC
Start: 2016-12-06 — End: 2016-12-06
  Administered 2016-12-06: 20 meq via ORAL
  Filled 2016-12-06: qty 1

## 2016-12-06 NOTE — Care Management Note (Signed)
Case Management Note  Patient Details  Name: Sarah Perkins MRN: [id number] Date of Birth: 1917-10-21  Subjective/Objective:  83 y/o f admitted w/GIB.DNR. From home. PT/OT cons-await recc.                 Action/Plan:d/c plan home.   Expected Discharge Date:   (unknown)               Expected Discharge Plan:  [place]  In-House Referral:     Discharge planning Services  CM Consult  Post Acute Care Choice:    Choice offered to:     DME Arranged:    DME Agency:     HH Arranged:    HH Agency:     Status of Service:  In process, will continue to follow  If discussed at Long Length of Stay Meetings, dates discussed:    Additional Comments:  Dessa Phi, RN 12/06/2016, 8:52 AM

## 2016-12-06 NOTE — Clinical Social Work Note (Signed)
Clinical Social Work Assessment  Patient Details  Name: Sarah Perkins MRN: [id number] Date of Birth: 01-Jul-1917  Date of referral:  12/06/16               Reason for consult:  Facility Placement                Permission sought to share information with:  Family Supports Permission granted to share information::  Yes, Verbal Permission Granted  Name::     son Veterinary surgeon::     Relationship::     Contact Information:     Housing/Transportation Living arrangements for the past 2 months:  [place] of Information:  Patient, Adult Children Patient Interpreter Needed:  None Criminal Activity/Legal Involvement Pertinent to Current Situation/Hospitalization:  No - Comment as needed Significant Relationships:  Adult Children Lives with:  Adult Children Do you feel safe going back to the place where you live?  Yes Need for family participation in patient care:  Yes (Comment) (pt with dementia, son primary decision maker)  Care giving concerns:  Pt from home where she resides with son. Has dementia and is largely bed-bound. Per son requires extensive assistance at baseline.    Social Worker assessment / plan:  CSW consulted to assess need for SNF placement. Met with pt at bedside, pleasant but confused. States she lives with her "brother and will go back home there." Pt resides with adult son. Spoke with son and reviewed therapy recommendations and pt's care needs. Pt eval recommending HHPT/24/7 supervision which son states he can provide at home.  Son agreeable to having Agency and possible RN come to home to work with pt following DC.  Plan: return home with son at [place] with home health.   Employment status:  Retired Forensic scientist:  [employer] PT Recommendations:  [place], Home with [place] / Referral to community resources:     Patient/Family's Response to care:  Son engaged and appreciative of care.    Patient/Family's Understanding of and Emotional Response to Diagnosis, Current Treatment, and Prognosis:  Son demonstrates adequate understanding of plan. Initially had questions as "the other times she has been in the hospital she went to rehab from there" however following conversation about current recommendations and care needs not necessarily exceeding what is currently being managed at home, son very agreeable and hopeful for pt to return home when stable.  Emotional Assessment Appearance:  Appears stated age Attitude/Demeanor/Rapport:   (pleasant but confused at times) Affect (typically observed):  Calm Orientation:  Oriented to Self, Oriented to Place Alcohol / Substance use:  Not Applicable Psych involvement (Current and /or in the community):  No (Comment)  Discharge Needs  Concerns to be addressed:  Discharge Planning Concerns Readmission within the last 30 days:  No Current discharge risk:  None Barriers to Discharge:  Continued Medical Work up   [employer], LCSW 12/06/2016, 4:40 PM  [phone]

## 2016-12-06 NOTE — Evaluation (Signed)
Physical Therapy Evaluation Patient Details Name: Sarah Perkins MRN: [id number] DOB: 06-05-1917 Today's Date: 12/06/2016   History of Present Illness  Pt was admitted for GIB. She has a h/o dementia  Clinical Impression  The patient requires total assist for mobility. No family present to provide PLOF. Chart indicates WC /bed bound. Pt admitted with above diagnosis. Pt currently with functional limitations due to the deficits listed below (see PT Problem List).  Pt will benefit from skilled PT to increase their independence and safety with mobility to allow discharge to the venue listed below.      Follow Up Recommendations Home health PT;Supervision/Assistance - 24 hour (per chart notes, son plans  for DC home)    Equipment Recommendations  None recommended by PT    Recommendations for Other Services       Precautions / Restrictions Precautions Precautions: Fall Precaution Comments: incontinence Restrictions Weight Bearing Restrictions: No      Mobility  Bed Mobility Overal bed mobility: Needs Assistance Bed Mobility: Rolling;Supine to Sit;Sit to Supine           General bed mobility comments: total +2 for all, patient does not assist   Transfers                 General transfer comment: pt resisted attempt  Ambulation/Gait                Stairs            Wheelchair Mobility    Modified Rankin (Stroke Patients Only)       Balance Overall balance assessment: Needs assistance     Sitting balance - Comments: min guard to mod A sitting EOB; pt fatiques and leaned posteriorly Postural control: Left lateral lean;Posterior lean                                   Pertinent Vitals/Pain Pain Assessment: No/denies pain    Home Living Family/patient expects to be discharged to:: Private residence Living Arrangements: [name]: Environmental consultant - 2 wheels;Wheelchair - manual Additional Comments:  per chart, pt is w/c and bed bound. Son is caregiver.  Unsure if she performs SPT or is continent    Prior Function Level of Independence: Needs assistance         Comments: unsure of amount of A, anticipate extensive to total     Hand Dominance        Extremity/Trunk Assessment   Upper Extremity Assessment Upper Extremity Assessment: Defer to OT evaluation    Lower Extremity Assessment Lower Extremity Assessment: RLE deficits/detail;LLE deficits/detail RLE Deficits / Details: does not move the legs to assist,unable to fully test LLE Deficits / Details: same as right    Cervical / Trunk Assessment Cervical / Trunk Assessment: Kyphotic  Communication   Communication: HOH  Cognition Arousal/Alertness: Awake/alert Behavior During Therapy: WFL for tasks assessed/performed Overall Cognitive Status: History of cognitive impairments - at baseline                                        General Comments      Exercises     Assessment/Plan    PT Assessment Patient needs continued PT services  PT Problem List Decreased strength;Decreased range of motion;Decreased  activity tolerance;Decreased balance;Decreased mobility;Decreased knowledge of precautions;Decreased safety awareness;Decreased knowledge of use of DME;Decreased cognition       PT Treatment Interventions Functional mobility training;Therapeutic activities;Patient/family education    PT Goals (Current goals can be found in the Care Plan section)  Acute Rehab PT Goals Patient Stated Goal: none stated PT Goal Formulation: Patient unable to participate in goal setting Time For Goal Achievement: 12/20/16 Potential to Achieve Goals: Fair    Frequency Min 2X/week   Barriers to discharge        Co-evaluation PT/OT/SLP Co-Evaluation/Treatment: Yes Reason for Co-Treatment: Necessary to address cognition/behavior during functional activity PT goals addressed during session: Mobility/safety with  mobility OT goals addressed during session: ADL's and self-care       AM-PAC PT "6 Clicks" Daily Activity  Outcome Measure Difficulty turning over in bed (including adjusting bedclothes, sheets and blankets)?: Total Difficulty moving from lying on back to sitting on the side of the bed? : Total Difficulty sitting down on and standing up from a chair with arms (e.g., wheelchair, bedside commode, etc,.)?: Total Help needed moving to and from a bed to chair (including a wheelchair)?: Total Help needed walking in hospital room?: Total Help needed climbing 3-5 steps with a railing? : Total 6 Click Score: 6    End of Session   Activity Tolerance: Patient limited by fatigue Patient left: with call bell/phone within reach;with bed alarm set Nurse Communication: Mobility status PT Visit Diagnosis: Muscle weakness (generalized) (M62.81)    Time: 3212-2482 PT Time Calculation (min) (ACUTE ONLY): 22 min   Charges:   PT Evaluation $PT Eval Low Complexity: 1 Low     PT G CodesTresa Perkins PT [phone]   Sarah Perkins 12/06/2016, 9:53 AM

## 2016-12-06 NOTE — Progress Notes (Signed)
  Speech Language Pathology Treatment: Dysphagia  Patient Details Name: Sarah Perkins MRN: [id number] DOB: [birth year] Today's Date: 12/06/2016 Time: 1450-1510 SLP Time Calculation (min) (ACUTE ONLY): 20 min  Assessment / Plan / Recommendation Clinical Impression  Note pt diet advanced today to full liquids - pt sitting upright in bed with meal tray partially empty.  Observed with with a few bites (x3) of soft solids (chicken noodle/crackers).  Slow mastication with delayed transiting and minimal residuals noted without pt awareness but  NO indication of airway compromise.  Pt swallow continues to be delayed.  She is more willing to accept po with SlP feeding her than self feeding stating "I can't do things like I used to".    When ready to advance would recommend very moist soft foods/thin liquids (dys3).  Will follow up x1 after diet advancement for family education to help maximize intake.    HPI HPI: 83 yo female adm to [facility] with GI bleed.  PMH + for dementia, GERD, HTN, tremor in right arm, diverticular bleeding, HTN.  Pt was placed on clear liquids-now full liquid diet.  PMH + for dysphagia - cognitive related evidenced as prolonged mastication with recommendation for either dys3/dys2 diet with thin liquids.  RN reports pt tolerating po medications well.        SLP Plan  Continue with current plan of care       Recommendations  Diet recommendations: Dysphagia 2 (fine chop);Thin liquid (when Md indicates diet ready to be advanced) Liquids provided via: Straw;Cup Medication Administration: Whole meds with puree Supervision: Intermittent supervision to cue for compensatory strategies (intermittent supervision/assistance may help maximize intake as pt states "I can't do for myself like I used to") Compensations: Slow rate;Small sips/bites;Follow solids with liquid Postural Changes and/or Swallow Maneuvers: Seated upright 90 degrees;Upright 30-60 min after meal                Oral Care Recommendations: Oral care BID Follow up Recommendations: None SLP Visit Diagnosis: Dysphagia, oral phase (R13.11) Plan: Continue with current plan of care       GO               Luanna Salk, [place] [facility] SLP [phone]  Macario Golds 12/06/2016, 3:22 PM

## 2016-12-06 NOTE — Progress Notes (Signed)
PROGRESS NOTE    Sarah Perkins  NLZ:[id number] DOB: 1918/03/06 DOA: 12/03/2016 PCP: Thressa Sheller, MD   Brief Narrative:  Patient is a 57 showed female history of multiple episodes of diverticular bleed in the past, gastroesophageal reflux disease and duodenitis, dementia, hypertension presenting to the ED with bright red blood per rectum.   Assessment & Plan:   Principal Problem:   GIB (gastrointestinal bleeding) Active Problems:   Hematochezia   GERD (gastroesophageal reflux disease)   Diverticulosis   Dementia   Chronic diastolic CHF (congestive heart failure) (HCC)   Constipation   CKD (chronic kidney disease) stage 2, GFR 60-89 ml/min   FTT (failure to thrive) in adult   Essential hypertension   Malnutrition of moderate degree   Pressure injury of skin  #1 hematochezia/probable lower GI bleed Patient presented with bright red blood per rectum likely lower GI bleed felt likely to be a diverticular bleed. Patient noted to have multiple bloody bowel movements 2 nights ago. Patient with no further melanotic stools today. Patient status post total of 4 units packed red blood cells during this hospitalization. Hemoglobin currently at 8.5. Follow H&H. Supportive care.   #2 gastroesophageal reflux disease PPI.  #3 pressure injury to the skin Wound care.  #4 dementia Stable. Follow.  #5 hypertension Hold antihypertensive medications for now.  #6 chronic diastolic heart failure Diuretics on hold. Follow. Monitor closely for volume overload.  #7 failure to thrive/moderate malnutrition Consulted dietitian once tolerating oral intake and bleeding is resolved. Patient has been seen by speech therapy will place on a dysphagia 2 diet.   DVT prophylaxis: SCDs Code Status: DO NOT RESUSCITATE Family Communication: Updated son at bedside. Disposition Plan: Skilled nursing facility versus home with home health.   Consultants:   None  Procedures:   1 unit packed  red blood cells 12/03/2016  1 unit packed red blood cells 12/04/2016  2 units packed red blood cells 12/05/2016  Antimicrobials:   None   Subjective: Patient more alert today. Patient sitting up in bed. No further bloody bowel movements today. Per nursing no bloody bowel movements overnight. No chest pain. No shortness of breath. Tolerating full liquid diet.   Objective: Vitals:   12/05/16 2050 12/05/16 2105 12/05/16 2355 12/06/16 0439  BP: (!) 115/39 (!) 113/54 (!) 126/44 (!) 133/45  Pulse: 90 90 83 82  Resp: 18 16 16 16   Temp: 98.4 F (36.9 C) 98.1 F (36.7 C) 98.3 F (36.8 C) 98.2 F (36.8 C)  TempSrc: Oral Oral Oral Oral  SpO2:  96%  93%  Weight:      Height:        Intake/Output Summary (Last 24 hours) at 12/06/16 1150 Last data filed at 12/06/16 0908  Gross per 24 hour  Intake             1335 ml  Output             1050 ml  Net              285 ml   Filed Weights   12/03/16 0954 12/03/16 1625  Weight: 55.3 kg (122 lb) 55.3 kg (122 lb)    Examination:  General exam: Appears calm and comfortable. Respiratory system: Clear to auscultation Bilaterally.Marland Kitchen Respiratory effort normal. Cardiovascular system: S1 & S2 heard, RRR. No JVD, murmurs, rubs, gallops or clicks. No pedal edema. Gastrointestinal system: Abdomen is nondistended, soft and nontender. No organomegaly or masses felt. Normal bowel sounds heard. Central nervous system:  Alert and oriented. No focal neurological deficits. Extremities: Symmetric 5 x 5 power. Left upper extremity swelling secondary to infiltration of IV site Slowly improving. Skin: Sacral pressure injury. Psychiatry: Judgement and insight appear poor. Mood & affect flat.     Data Reviewed: I have personally reviewed following labs and imaging studies  CBC:  Recent Labs Lab 12/03/16 2202 12/04/16 1204 12/04/16 2255 12/05/16 0948 12/06/16 0345  WBC 10.0 11.4* 10.9* 13.0* 11.7*  HGB 7.0* 8.3* 6.1* 6.7* 8.5*  HCT 20.9* 24.8*  17.9* 19.8* 25.0*  MCV 90.9 89.5 87.7 90.4 86.8  PLT 194 186 153 165 270   Basic Metabolic Panel:  Recent Labs Lab 12/03/16 1104 12/04/16 1204 12/05/16 0948 12/06/16 0345  NA 143 146* 144 141  K 4.0 4.2 4.0 3.2*  CL 106 115* 112* 109  CO2 29 25 25 26   GLUCOSE 118* 97 125* 94  BUN 27* 28* 23* 20  CREATININE 0.97 0.87 0.82 0.88  CALCIUM 8.6* 8.5* 8.4* 8.0*  MG  --   --  2.0  --    GFR: Estimated Creatinine Clearance: 32.6 mL/min (by C-G formula based on SCr of 0.88 mg/dL). Liver Function Tests:  Recent Labs Lab 12/03/16 1104  AST 15  ALT 7*  ALKPHOS 40  BILITOT 0.7  PROT 7.0  ALBUMIN 2.7*   No results for input(s): LIPASE, AMYLASE in the last 168 hours. No results for input(s): AMMONIA in the last 168 hours. Coagulation Profile:  Recent Labs Lab 12/03/16 1104  INR 1.13   Cardiac Enzymes: No results for input(s): CKTOTAL, CKMB, CKMBINDEX, TROPONINI in the last 168 hours. BNP (last 3 results) No results for input(s): PROBNP in the last 8760 hours. HbA1C: No results for input(s): HGBA1C in the last 72 hours. CBG: No results for input(s): GLUCAP in the last 168 hours. Lipid Profile: No results for input(s): CHOL, HDL, LDLCALC, TRIG, CHOLHDL, LDLDIRECT in the last 72 hours. Thyroid Function Tests: No results for input(s): TSH, T4TOTAL, FREET4, T3FREE, THYROIDAB in the last 72 hours. Anemia Panel: No results for input(s): VITAMINB12, FOLATE, FERRITIN, TIBC, IRON, RETICCTPCT in the last 72 hours. Sepsis Labs: No results for input(s): PROCALCITON, LATICACIDVEN in the last 168 hours.  No results found for this or any previous visit (from the past 240 hour(s)).       Radiology Studies: No results found.      Scheduled Meds: . feeding supplement  1 Container Oral TID BM  . hydrocerin   Topical BID  . multivitamin  15 mL Oral Daily  . pantoprazole  40 mg Oral Daily  . potassium chloride  40 mEq Oral Q4H   Continuous Infusions:    LOS: 3 days     Time spent: 35 minutes    Turkessa Ostrom, MD [facility] Pager [phone]  If 7PM-7AM, please contact night-coverage [email or web address] Password [email or web address] 12/06/2016, 11:50 AM

## 2016-12-06 NOTE — Evaluation (Signed)
Occupational Therapy Evaluation Patient Details Name: Sarah Perkins MRN: [id number] DOB: 1918-01-04 Today's Date: 12/06/2016    History of Present Illness Pt was admitted for GIB. She has a h/o dementia   Clinical Impression   This 83 year old female was admitted for the above.  She lives at home with son; unsure how much she participates. Will not follow in acute setting. If son feels he needs any further education for assisting pt, then [place] could be sent out.  Pt may be more comfortable with him around.    Follow Up Recommendations  Supervision/Assistance - 24 hour (?  HHOT if son feels it would be helpful); SNF if son cannot manage   Equipment Recommendations  None recommended by OT    Recommendations for Other Services       Precautions / Restrictions Precautions Precautions: Fall Restrictions Weight Bearing Restrictions: No      Mobility Bed Mobility Overal bed mobility: Needs Assistance Bed Mobility: Rolling;Supine to Sit;Sit to Supine           General bed mobility comments: total +2 for all  Transfers                 General transfer comment: pt resisted attempt    Balance Overall balance assessment: Needs assistance     Sitting balance - Comments: min guard to mod A sitting EOB; pt fatiques and leaned posteriorly                                   ADL either performed or assessed with clinical judgement   ADL Overall ADL's : Needs assistance/impaired Eating/Feeding: Minimal assistance;Sitting (1/4 of beverage)   Grooming: Sitting;Moderate assistance (able to wash face with set up; more A for other tasks)   Upper Body Bathing: Maximal assistance;Bed level   Lower Body Bathing: Total assistance;Bed level;+2 for physical assistance   Upper Body Dressing : Total assistance;Bed level   Lower Body Dressing: Total assistance;Bed level;+2 for physical assistance       Toileting- Clothing Manipulation and Hygiene: Total  assistance;+2 for physical assistance         General ADL Comments: pt sat EOB, but very fearful of falling. Would not try to stand--resisted. She was also fearful of rolling and resisted this.      Vision         Perception     Praxis      Pertinent Vitals/Pain Pain Assessment: No/denies pain     Hand Dominance     Extremity/Trunk Assessment Upper Extremity Assessment Upper Extremity Assessment: Generalized weakness           Communication Communication Communication: HOH   Cognition Arousal/Alertness: Awake/alert Behavior During Therapy: WFL for tasks assessed/performed Overall Cognitive Status: History of cognitive impairments - at baseline                                     General Comments       Exercises     Shoulder Instructions      Home Living Family/patient expects to be discharged to:: Private residence Living Arrangements: Children                               Additional Comments: per chart, pt is w/c and bed  bound. Son is caregiver.  Unsure if she performs SPT or is continent      Prior Functioning/Environment Level of Independence: Needs assistance        Comments: unsure of amount of A, anticipate extensive to total        OT Problem List: Decreased strength;Decreased activity tolerance;Impaired balance (sitting and/or standing);Decreased cognition;Decreased safety awareness      OT Treatment/Interventions:      OT Goals(Current goals can be found in the care plan section) Acute Rehab OT Goals Patient Stated Goal: none stated OT Goal Formulation: All assessment and education complete, DC therapy  OT Frequency:     Barriers to D/C:            Co-evaluation              AM-PAC PT "6 Clicks" Daily Activity     Outcome Measure Help from another person eating meals?: A Lot Help from another person taking care of personal grooming?: A Lot Help from another person toileting, which includes  using toliet, bedpan, or urinal?: Total Help from another person bathing (including washing, rinsing, drying)?: A Lot Help from another person to put on and taking off regular upper body clothing?: A Lot Help from another person to put on and taking off regular lower body clothing?: Total 6 Click Score: 10   End of Session    Activity Tolerance: Patient limited by fatigue Patient left: in bed;with call bell/phone within reach;with bed alarm set  OT Visit Diagnosis: Muscle weakness (generalized) (M62.81)                Time: 1655-3748 OT Time Calculation (min): 24 min Charges:  OT General Charges $OT Visit: 1 Procedure OT Evaluation $OT Eval Low Complexity: 1 Procedure G-Codes:     [place], OTR/L [phone] 12/06/2016  Ronn Smolinsky 12/06/2016, 9:21 AM

## 2016-12-07 LAB — URINE CULTURE

## 2016-12-07 LAB — CBC WITH DIFFERENTIAL/PLATELET
BASOS PCT: 0 %
Basophils Absolute: 0 10*3/uL (ref 0.0–0.1)
Eosinophils Absolute: 0.5 10*3/uL (ref 0.0–0.7)
Eosinophils Relative: 4 %
HEMATOCRIT: 23.3 % — AB (ref 36.0–46.0)
HEMOGLOBIN: 8 g/dL — AB (ref 12.0–15.0)
LYMPHS ABS: 2.9 10*3/uL (ref 0.7–4.0)
Lymphocytes Relative: 27 %
MCH: 30.8 pg (ref 26.0–34.0)
MCHC: 34.3 g/dL (ref 30.0–36.0)
MCV: 89.6 fL (ref 78.0–100.0)
MONOS PCT: 12 %
Monocytes Absolute: 1.3 10*3/uL — ABNORMAL HIGH (ref 0.1–1.0)
NEUTROS ABS: 6.1 10*3/uL (ref 1.7–7.7)
NEUTROS PCT: 57 %
Platelets: 168 10*3/uL (ref 150–400)
RBC: 2.6 MIL/uL — ABNORMAL LOW (ref 3.87–5.11)
RDW: 16.7 % — ABNORMAL HIGH (ref 11.5–15.5)
WBC: 10.8 10*3/uL — AB (ref 4.0–10.5)

## 2016-12-07 LAB — BASIC METABOLIC PANEL
Anion gap: 6 (ref 5–15)
BUN: 23 mg/dL — AB (ref 6–20)
CHLORIDE: 113 mmol/L — AB (ref 101–111)
CO2: 25 mmol/L (ref 22–32)
Calcium: 8.3 mg/dL — ABNORMAL LOW (ref 8.9–10.3)
Creatinine, Ser: 0.78 mg/dL (ref 0.44–1.00)
GFR calc Af Amer: >60 mL/min (ref >60)
GFR calc non Af Amer: >60 mL/min (ref >60)
GLUCOSE: 103 mg/dL — AB (ref 65–99)
POTASSIUM: 4.1 mmol/L (ref 3.5–5.1)
Sodium: 144 mmol/L (ref 135–145)

## 2016-12-07 LAB — HEMOGLOBIN AND HEMATOCRIT, BLOOD
HCT: 27.1 % — ABNORMAL LOW (ref 36.0–46.0)
Hemoglobin: 9.1 g/dL — ABNORMAL LOW (ref 12.0–15.0)

## 2016-12-07 MED ORDER — PROCHLORPERAZINE EDISYLATE 5 MG/ML IJ SOLN: 10.0000 mg | Freq: Four times a day (QID) | INTRAMUSCULAR | Status: DC | PRN

## 2016-12-07 MED ORDER — FUROSEMIDE 20 MG PO TABS
20.0000 mg | ORAL_TABLET | Freq: Every day | ORAL | Status: DC
  Administered 2016-12-08: 20 mg via ORAL
  Filled 2016-12-07: qty 1

## 2016-12-07 MED ORDER — POTASSIUM CHLORIDE CRYS ER 20 MEQ PO TBCR
40.0000 meq | EXTENDED_RELEASE_TABLET | Freq: Every day | ORAL | Status: DC
  Administered 2016-12-08: 40 meq via ORAL
  Filled 2016-12-07: qty 2

## 2016-12-07 MED ORDER — FUROSEMIDE 10 MG/ML IJ SOLN
20.0000 mg | Freq: Once | INTRAMUSCULAR | Status: AC
  Administered 2016-12-07: 20 mg via INTRAVENOUS
  Filled 2016-12-07: qty 2

## 2016-12-07 NOTE — Progress Notes (Signed)
PROGRESS NOTE    Sarah Perkins  EHM:[id number] DOB: 01-Mar-1918 DOA: 12/03/2016 PCP: Thressa Sheller, MD   Brief Narrative:  Patient is a 63 showed female history of multiple episodes of diverticular bleed in the past, gastroesophageal reflux disease and duodenitis, dementia, hypertension presenting to the ED with bright red blood per rectum.   Assessment & Plan:   Principal Problem:   Acute GI bleeding Active Problems:   Hematochezia   GERD (gastroesophageal reflux disease)   Diverticulosis   Dementia   Chronic diastolic CHF (congestive heart failure) (HCC)   Constipation   CKD (chronic kidney disease) stage 2, GFR 60-89 ml/min   FTT (failure to thrive) in adult   Essential hypertension   Malnutrition of moderate degree   Pressure injury of skin  #1 hematochezia/probable lower GI bleed Patient presented with bright red blood per rectum likely lower GI bleed felt likely to be a diverticular bleed. Patient noted to have multiple bloody bowel movements 3 nights ago. Patient with no further melanotic stools today. Patient status post total of 4 units packed red blood cells during this hospitalization. Hemoglobin currently at 8.0. Follow H&H this afternoon. Supportive care.   #2 gastroesophageal reflux disease PPI.  #3 pressure injury to the skin Wound care.  #4 dementia Stable. Follow.  #5 hypertension Hold antihypertensive medications for now.  #6 chronic diastolic heart failure Diuretics on hold. Will give a dose of Lasix 20 mg IV 1. Resume home dose Lasix tomorrow. Follow.  #7 failure to thrive/moderate malnutrition Consulted dietitian once tolerating oral intake and bleeding is resolved. Patient has been seen by speech therapy and tolerating dysphagia 2 diet.   DVT prophylaxis: SCDs Code Status: DO NOT RESUSCITATE Family Communication: Updated son at bedside. Disposition Plan: Home with home health if no further bleeding and hemoglobin stable over the  tomorrow.   Consultants:   None  Procedures:   1 unit packed red blood cells 12/03/2016  1 unit packed red blood cells 12/04/2016  2 units packed red blood cells 12/05/2016  Antimicrobials:   None   Subjective: Patient alert. Laying in bed. No chest pain. No shortness of breath. Per nursing no bloody bowel movements overnight. Tolerating dysphagia 2 diet.   Objective: Vitals:   12/06/16 1543 12/06/16 2039 12/07/16 0520 12/07/16 0900  BP: (!) 145/55 (!) 136/53 (!) 148/61   Pulse: 86 90 82   Resp: 18 16 14    Temp: 98.2 F (36.8 C) 99.3 F (37.4 C) 98 F (36.7 C)   TempSrc: Oral Oral Oral   SpO2: 94% 93% 91%   Weight:    64 kg (141 lb 3.2 oz)  Height:        Intake/Output Summary (Last 24 hours) at 12/07/16 1149 Last data filed at 12/07/16 0520  Gross per 24 hour  Intake              340 ml  Output              300 ml  Net               40 ml   Filed Weights   12/03/16 0954 12/03/16 1625 12/07/16 0900  Weight: 55.3 kg (122 lb) 55.3 kg (122 lb) 64 kg (141 lb 3.2 oz)    Examination:  General exam: Appears calm and comfortable. Respiratory system: Some bibasilar crackles.Marland Kitchen Respiratory effort normal. Cardiovascular system: S1 & S2 heard, RRR. No JVD, murmurs, rubs, gallops or clicks. No pedal edema. Gastrointestinal system: Abdomen  is nondistended, soft and nontender. No organomegaly or masses felt. Normal bowel sounds heard. Central nervous system: Alert and oriented. No focal neurological deficits. Extremities: Symmetric 5 x 5 power. Left upper extremity swelling secondary to infiltration of IV site Slowly improving. Skin: Sacral pressure injury. Psychiatry: Judgement and insight appear poor. Mood & affect flat.     Data Reviewed: I have personally reviewed following labs and imaging studies  CBC:  Recent Labs Lab 12/04/16 1204 12/04/16 2255 12/05/16 0948 12/06/16 0345 12/07/16 0403  WBC 11.4* 10.9* 13.0* 11.7* 10.8*  NEUTROABS  --   --   --   --   6.1  HGB 8.3* 6.1* 6.7* 8.5* 8.0*  HCT 24.8* 17.9* 19.8* 25.0* 23.3*  MCV 89.5 87.7 90.4 86.8 89.6  PLT 186 153 165 151 326   Basic Metabolic Panel:  Recent Labs Lab 12/03/16 1104 12/04/16 1204 12/05/16 0948 12/06/16 0345 12/07/16 0403  NA 143 146* 144 141 144  K 4.0 4.2 4.0 3.2* 4.1  CL 106 115* 112* 109 113*  CO2 29 25 25 26 25   GLUCOSE 118* 97 125* 94 103*  BUN 27* 28* 23* 20 23*  CREATININE 0.97 0.87 0.82 0.88 0.78  CALCIUM 8.6* 8.5* 8.4* 8.0* 8.3*  MG  --   --  2.0  --   --    GFR: Estimated Creatinine Clearance: 40 mL/min (by C-G formula based on SCr of 0.78 mg/dL). Liver Function Tests:  Recent Labs Lab 12/03/16 1104  AST 15  ALT 7*  ALKPHOS 40  BILITOT 0.7  PROT 7.0  ALBUMIN 2.7*   No results for input(s): LIPASE, AMYLASE in the last 168 hours. No results for input(s): AMMONIA in the last 168 hours. Coagulation Profile:  Recent Labs Lab 12/03/16 1104  INR 1.13   Cardiac Enzymes: No results for input(s): CKTOTAL, CKMB, CKMBINDEX, TROPONINI in the last 168 hours. BNP (last 3 results) No results for input(s): PROBNP in the last 8760 hours. HbA1C: No results for input(s): HGBA1C in the last 72 hours. CBG: No results for input(s): GLUCAP in the last 168 hours. Lipid Profile: No results for input(s): CHOL, HDL, LDLCALC, TRIG, CHOLHDL, LDLDIRECT in the last 72 hours. Thyroid Function Tests: No results for input(s): TSH, T4TOTAL, FREET4, T3FREE, THYROIDAB in the last 72 hours. Anemia Panel: No results for input(s): VITAMINB12, FOLATE, FERRITIN, TIBC, IRON, RETICCTPCT in the last 72 hours. Sepsis Labs: No results for input(s): PROCALCITON, LATICACIDVEN in the last 168 hours.  Recent Results (from the past 240 hour(s))  Culture, Urine     Status: Abnormal   Collection Time: 12/05/16  5:20 AM  Result Value Ref Range Status   Specimen Description URINE, CLEAN CATCH  Final   Special Requests NONE  Final   Culture MULTIPLE SPECIES PRESENT, SUGGEST  RECOLLECTION (A)  Final   Report Status 12/07/2016 FINAL  Final         Radiology Studies: No results found.      Scheduled Meds: . feeding supplement  1 Container Oral TID BM  . hydrocerin   Topical BID  . multivitamin  15 mL Oral Daily  . pantoprazole  40 mg Oral Daily   Continuous Infusions:    LOS: 4 days    Time spent: 81 minutes    Idelia Caudell, MD [facility] Pager [phone]  If 7PM-7AM, please contact night-coverage [email or web address] Password [facility] 12/07/2016, 11:49 AM

## 2016-12-08 LAB — BASIC METABOLIC PANEL
ANION GAP: 8 (ref 5–15)
BUN: 23 mg/dL — ABNORMAL HIGH (ref 6–20)
CO2: 27 mmol/L (ref 22–32)
Calcium: 8.5 mg/dL — ABNORMAL LOW (ref 8.9–10.3)
Chloride: 110 mmol/L (ref 101–111)
Creatinine, Ser: 0.79 mg/dL (ref 0.44–1.00)
GFR calc Af Amer: >60 mL/min (ref >60)
GFR calc non Af Amer: >60 mL/min (ref >60)
GLUCOSE: 115 mg/dL — AB (ref 65–99)
POTASSIUM: 3.3 mmol/L — AB (ref 3.5–5.1)
Sodium: 145 mmol/L (ref 135–145)

## 2016-12-08 LAB — CBC
HEMATOCRIT: 26.1 % — AB (ref 36.0–46.0)
Hemoglobin: 8.6 g/dL — ABNORMAL LOW (ref 12.0–15.0)
MCH: 30 pg (ref 26.0–34.0)
MCHC: 33 g/dL (ref 30.0–36.0)
MCV: 90.9 fL (ref 78.0–100.0)
Platelets: 219 10*3/uL (ref 150–400)
RBC: 2.87 MIL/uL — AB (ref 3.87–5.11)
RDW: 16.6 % — ABNORMAL HIGH (ref 11.5–15.5)
WBC: 11.5 10*3/uL — AB (ref 4.0–10.5)

## 2016-12-08 MED ORDER — BOOST / RESOURCE BREEZE PO LIQD: 1.0000 | Freq: Three times a day (TID) | ORAL | 0 refills | Status: AC

## 2016-12-08 MED ORDER — ADULT MULTIVITAMIN LIQUID CH: 15.0000 mL | Freq: Every day | ORAL | Status: AC

## 2016-12-08 MED ORDER — POTASSIUM CHLORIDE CRYS ER 20 MEQ PO TBCR
40.0000 meq | EXTENDED_RELEASE_TABLET | Freq: Once | ORAL | Status: AC
  Administered 2016-12-08: 40 meq via ORAL
  Filled 2016-12-08: qty 2

## 2016-12-08 MED ORDER — HYDROCERIN EX CREA: 1.0000 "application " | TOPICAL_CREAM | Freq: Two times a day (BID) | CUTANEOUS | 0 refills | Status: AC

## 2016-12-08 NOTE — Care Management Note (Addendum)
Case Management Note  Patient Details  Name: Sarah Perkins MRN: [id number] Date of Birth: [birth year]  Subjective/Objective:    GI bleed, CHF, FTT, Dementia                Action/Plan: Discharge Planning: NCM spoke to pt and son, Richard at bedside. Offered choice for HH/list provided. Son states pt is active with [facility]. Contacted [facility] with referral. Pt has RW, bedside commode and wheelchair at home. Son states he is able to provide 24 hour care for pt. PTAR arranged.   PCP Thressa Sheller MD  Expected Discharge Date:  12/08/16               Expected Discharge Plan:  [place]  In-House Referral:  Clinical Social Work  Discharge planning Services  CM Consult  Post Acute Care Choice:  Home Health, Resumption of Svcs/PTA Provider Choice offered to:  Adult Children  DME Arranged:  N/A DME Agency:  NA  HH Arranged:  RN, PT, Social Work [employer] Agency:  [facility] [employer]  Status of Service:  Completed, signed off  If discussed at [employer], dates discussed:    Additional Comments:  Erenest Rasher, RN 12/08/2016, 5:48 PM

## 2016-12-08 NOTE — Discharge Summary (Signed)
Physician Discharge Summary  ROUX Sarah Perkins:[id number] DOB: October 13, 1917 DOA: 12/03/2016  PCP: Thressa Sheller, MD  Admit date: 12/03/2016 Discharge date: 12/08/2016  Time spent: 65 minutes  Recommendations for Outpatient Follow-up:  1. Follow-up with Thressa Sheller, MD in 2 weeks. On follow-up patient will need an CBC done to follow-up on the hemoglobin. Patient will also need a basic metabolic profile done to follow-up on electrolytes and renal function. 2.    Discharge Diagnoses:  Principal Problem:   Acute GI bleeding Active Problems:   Hematochezia   GERD (gastroesophageal reflux disease)   Diverticulosis   Dementia   Chronic diastolic CHF (congestive heart failure) (HCC)   Constipation   CKD (chronic kidney disease) stage 2, GFR 60-89 ml/min   FTT (failure to thrive) in adult   Essential hypertension   Malnutrition of moderate degree   Pressure injury of skin   Discharge Condition: Stable and improved  Diet recommendation: Dysphagia 2 diet  Filed Weights   12/03/16 0954 12/03/16 1625 12/07/16 0900  Weight: 55.3 kg (122 lb) 55.3 kg (122 lb) 64 kg (141 lb 3.2 oz)    History of present illness:  Per Dr Beverely Pace is a 83 y.o. female with medical history significant for several episodes of diverticular bleeding, GERD and duodenitis, dementia and recent femur fracture who was in her usual state of frail health living at home with her son until the morning of admission when she was noted by her son to "have that smell of that bleeding" and was noted to have bright red blood and clots in her bed. She was brought into the emergency room where she was noted to be normotensive, mildly tachycardic and have an H&H that were unchanged from previous. Patient was noted to have some bright red blood per rectum when she initially presented but has not had any further episodes since. Of note patient's son stated that he was told by GI physicians in the past  that "they wouldn't do any more colonoscopies anymore" but that "it was okay to give her blood if she needed it". Patient was now admitted for hematochezia thought to be secondary to diverticulosis.   Patient's son who was her primary caregiver noted that she is DO NOT RESUSCITATE as he understands that she has lived a long life and would not survive an attempt to resuscitate her. He also noted that he was aware that if she didn't stop bleeding on her own this might be a terminal event and he would be willing to pursue comfort care measures. However he would like to take her home if the bleeding would stop on its own and if she could get blood to support her in the meanwhile.  Patient herself was unable to provide any history due to dementia. However she did consent to allow admitting MD to examine her.   ED Course: Patient was treated with 3 boluses of normal saline 500 mL each. She was noted to be hemodynamically stable.  Hospital Course:  #1 hematochezia/probable lower GI bleed Patient presented with bright red blood per rectum likely lower GI bleed felt likely to be a diverticular bleed. Patient noted to have multiple bloody bowel movements during the initial part of the hospitalization. Patient improved clinically and number of melanotic stools decreased. Patient was started on a clear liquid diet which she tolerated. Patient's hemoglobin dropped to as low as 6.1.   Patient status post total of 4 units packed red blood cells during this hospitalization  with appropriate response. Patient improved clinically diet was advanced to a dysphagia 2 diet which patient tolerated. Patient's hemoglobin stabilized at 8.6 by day of discharge. Outpatient follow-up.  #2 gastroesophageal reflux disease Patient maintained on a PPI. Outpatient follow-up.  #3 pressure injury to the skin Wound care.  #4 dementia Stable. Follow.  #5 hypertension Patient's antihypertensive medications were held  throughout the hospitalization and will be resumed on discharge.   #6 chronic diastolic heart failure Patient diuretics were held initially on admission as patient was having a GI bleed and dehydrated and volume depleted. Patient was hydrated with IV fluids. Patient remained stable. Patient's remained euvolemic during the hospitalization. Once patient's GI bleed had subsided patient was resumed back on home regimen of Lasix. Patient did receive a dose of IV Lasix during the hospitalization with good urine output. Outpatient follow-up.  #7 failure to thrive/moderate malnutrition Consulted dietitian once tolerating oral intake and bleeding is resolved. Patient has been seen by speech therapy and tolerated dysphagia 2 diet.    Procedures:  1 unit packed red blood cells 12/03/2016  1 unit packed red blood cells 12/04/2016  2 units packed red blood cells 12/05/2016  Consultations:  None  Discharge Exam: Vitals:   12/07/16 2146 12/08/16 0602  BP: (!) 150/61 (!) 148/62  Pulse: 89 87  Resp: 16 14  Temp: 99.6 F (37.6 C) 98 F (36.7 C)  SpO2: 95% 95%    General: NAD Cardiovascular: RRR Respiratory: CTAB  Discharge Instructions   Discharge Instructions    Diet - low sodium heart healthy    Complete by:  As directed    Dysphagia 2 diet   Increase activity slowly    Complete by:  As directed      Current Discharge Medication List    START taking these medications   Details  feeding supplement (BOOST / RESOURCE BREEZE) LIQD Take 1 Container by mouth 3 (three) times daily between meals. Refills: 0    hydrocerin (EUCERIN) CREA Apply 1 application topically 2 (two) times daily. Refills: 0    Multiple Vitamin (MULTIVITAMIN) LIQD Take 15 mLs by mouth daily.      CONTINUE these medications which have NOT CHANGED   Details  acetaminophen (TYLENOL) 325 MG tablet Take 2 tablets (650 mg total) by mouth every 6 (six) hours as needed for mild pain or moderate pain (or Fever  >/= 101).    amLODipine (NORVASC) 5 MG tablet Take 5 mg by mouth daily.    CALCIUM-MAGNESIUM-ZINC PO Take 1 capsule by mouth daily.    cyanocobalamin 100 MCG tablet Take 100 mcg by mouth daily.    docusate sodium (COLACE) 100 MG capsule Take 100 mg by mouth at bedtime.    ferrous sulfate 325 (65 FE) MG tablet Take 325 mg by mouth daily with breakfast.    furosemide (LASIX) 20 MG tablet Take 1 tablet (20 mg total) by mouth daily. Refills: 0    neomycin-bacitracin-polymyxin (NEOSPORIN) [phone] ointment Apply topically 4 (four) times daily as needed (sores).    pantoprazole (PROTONIX) 40 MG tablet Take 1 tablet (40 mg total) by mouth daily. Qty: 30 tablet, Refills: 0    polyethylene glycol (MIRALAX / GLYCOLAX) packet Take 17 g by mouth every morning.    potassium chloride SA (K-DUR,KLOR-CON) 20 MEQ tablet Take 2 tablets (40 mEq total) by mouth daily. Qty: 30 tablet, Refills: 0    vitamin C (ASCORBIC ACID) 500 MG tablet Take 500 mg by mouth daily.  No Known Allergies Follow-up Information    Thressa Sheller, MD. Schedule an appointment as soon as possible for a visit in 2 week(s).   Specialty:  Internal Medicine Contact information: [place], [place] [address] [phone]            The results of significant diagnostics from this hospitalization (including imaging, microbiology, ancillary and laboratory) are listed below for reference.    Significant Diagnostic Studies: No results found.  Microbiology: Recent Results (from the past 240 hour(s))  Culture, Urine     Status: Abnormal   Collection Time: 12/05/16  5:20 AM  Result Value Ref Range Status   Specimen Description URINE, CLEAN CATCH  Final   Special Requests NONE  Final   Culture MULTIPLE SPECIES PRESENT, SUGGEST RECOLLECTION (A)  Final   Report Status 12/07/2016 FINAL  Final     Labs: Basic Metabolic Panel:  Recent Labs Lab 12/04/16 1204 12/05/16 0948  12/06/16 0345 12/07/16 0403 12/08/16 0356  NA 146* 144 141 144 145  K 4.2 4.0 3.2* 4.1 3.3*  CL 115* 112* 109 113* 110  CO2 25 25 26 25 27   GLUCOSE 97 125* 94 103* 115*  BUN 28* 23* 20 23* 23*  CREATININE 0.87 0.82 0.88 0.78 0.79  CALCIUM 8.5* 8.4* 8.0* 8.3* 8.5*  MG  --  2.0  --   --   --    Liver Function Tests:  Recent Labs Lab 12/03/16 1104  AST 15  ALT 7*  ALKPHOS 40  BILITOT 0.7  PROT 7.0  ALBUMIN 2.7*   No results for input(s): LIPASE, AMYLASE in the last 168 hours. No results for input(s): AMMONIA in the last 168 hours. CBC:  Recent Labs Lab 12/04/16 2255 12/05/16 0948 12/06/16 0345 12/07/16 0403 12/07/16 1420 12/08/16 0356  WBC 10.9* 13.0* 11.7* 10.8*  --  11.5*  NEUTROABS  --   --   --  6.1  --   --   HGB 6.1* 6.7* 8.5* 8.0* 9.1* 8.6*  HCT 17.9* 19.8* 25.0* 23.3* 27.1* 26.1*  MCV 87.7 90.4 86.8 89.6  --  90.9  PLT 153 165 151 168  --  219   Cardiac Enzymes: No results for input(s): CKTOTAL, CKMB, CKMBINDEX, TROPONINI in the last 168 hours. BNP: BNP (last 3 results) No results for input(s): BNP in the last 8760 hours.  ProBNP (last 3 results) No results for input(s): PROBNP in the last 8760 hours.  CBG: No results for input(s): GLUCAP in the last 168 hours.     SignedIrine Seal MD.  [facility] 12/08/2016, 12:49 PM

## 2016-12-08 NOTE — Care Management Important Message (Signed)
Important Message  Patient Details  Name: Sarah Perkins MRN: [id number] Date of Birth: 04-Aug-1917   Medicare Important Message Given:  Yes    Erenest Rasher, RN 12/08/2016, 2:36 PM

## 2016-12-08 NOTE — Progress Notes (Signed)
Patient discharged to home via [facility], discharge instructions reviewed with son who verbalized understanding. No new RX.

## 2016-12-11 DIAGNOSIS — I5032 Chronic diastolic (congestive) heart failure: Secondary | ICD-10-CM | POA: Diagnosis not present

## 2016-12-11 DIAGNOSIS — D509 Iron deficiency anemia, unspecified: Secondary | ICD-10-CM | POA: Diagnosis not present

## 2016-12-11 DIAGNOSIS — N182 Chronic kidney disease, stage 2 (mild): Secondary | ICD-10-CM | POA: Diagnosis not present

## 2016-12-11 DIAGNOSIS — L8990 Pressure ulcer of unspecified site, unspecified stage: Secondary | ICD-10-CM | POA: Diagnosis not present

## 2016-12-11 DIAGNOSIS — K5790 Diverticulosis of intestine, part unspecified, without perforation or abscess without bleeding: Secondary | ICD-10-CM | POA: Diagnosis not present

## 2016-12-11 DIAGNOSIS — I130 Hypertensive heart and chronic kidney disease with heart failure and stage 1 through stage 4 chronic kidney disease, or unspecified chronic kidney disease: Secondary | ICD-10-CM | POA: Diagnosis not present

## 2016-12-12 DIAGNOSIS — I5032 Chronic diastolic (congestive) heart failure: Secondary | ICD-10-CM | POA: Diagnosis not present

## 2016-12-12 DIAGNOSIS — I130 Hypertensive heart and chronic kidney disease with heart failure and stage 1 through stage 4 chronic kidney disease, or unspecified chronic kidney disease: Secondary | ICD-10-CM | POA: Diagnosis not present

## 2016-12-12 DIAGNOSIS — D509 Iron deficiency anemia, unspecified: Secondary | ICD-10-CM | POA: Diagnosis not present

## 2016-12-12 DIAGNOSIS — N182 Chronic kidney disease, stage 2 (mild): Secondary | ICD-10-CM | POA: Diagnosis not present

## 2016-12-12 DIAGNOSIS — K5790 Diverticulosis of intestine, part unspecified, without perforation or abscess without bleeding: Secondary | ICD-10-CM | POA: Diagnosis not present

## 2016-12-12 DIAGNOSIS — L8990 Pressure ulcer of unspecified site, unspecified stage: Secondary | ICD-10-CM | POA: Diagnosis not present

## 2016-12-13 DIAGNOSIS — D509 Iron deficiency anemia, unspecified: Secondary | ICD-10-CM | POA: Diagnosis not present

## 2016-12-13 DIAGNOSIS — N182 Chronic kidney disease, stage 2 (mild): Secondary | ICD-10-CM | POA: Diagnosis not present

## 2016-12-13 DIAGNOSIS — I5032 Chronic diastolic (congestive) heart failure: Secondary | ICD-10-CM | POA: Diagnosis not present

## 2016-12-13 DIAGNOSIS — K5790 Diverticulosis of intestine, part unspecified, without perforation or abscess without bleeding: Secondary | ICD-10-CM | POA: Diagnosis not present

## 2016-12-13 DIAGNOSIS — I130 Hypertensive heart and chronic kidney disease with heart failure and stage 1 through stage 4 chronic kidney disease, or unspecified chronic kidney disease: Secondary | ICD-10-CM | POA: Diagnosis not present

## 2016-12-13 DIAGNOSIS — L8990 Pressure ulcer of unspecified site, unspecified stage: Secondary | ICD-10-CM | POA: Diagnosis not present

## 2016-12-16 DIAGNOSIS — K5790 Diverticulosis of intestine, part unspecified, without perforation or abscess without bleeding: Secondary | ICD-10-CM | POA: Diagnosis not present

## 2016-12-16 DIAGNOSIS — D509 Iron deficiency anemia, unspecified: Secondary | ICD-10-CM | POA: Diagnosis not present

## 2016-12-16 DIAGNOSIS — L8990 Pressure ulcer of unspecified site, unspecified stage: Secondary | ICD-10-CM | POA: Diagnosis not present

## 2016-12-16 DIAGNOSIS — N182 Chronic kidney disease, stage 2 (mild): Secondary | ICD-10-CM | POA: Diagnosis not present

## 2016-12-16 DIAGNOSIS — I5032 Chronic diastolic (congestive) heart failure: Secondary | ICD-10-CM | POA: Diagnosis not present

## 2016-12-16 DIAGNOSIS — I130 Hypertensive heart and chronic kidney disease with heart failure and stage 1 through stage 4 chronic kidney disease, or unspecified chronic kidney disease: Secondary | ICD-10-CM | POA: Diagnosis not present

## 2016-12-19 DIAGNOSIS — I130 Hypertensive heart and chronic kidney disease with heart failure and stage 1 through stage 4 chronic kidney disease, or unspecified chronic kidney disease: Secondary | ICD-10-CM | POA: Diagnosis not present

## 2016-12-19 DIAGNOSIS — I5032 Chronic diastolic (congestive) heart failure: Secondary | ICD-10-CM | POA: Diagnosis not present

## 2016-12-19 DIAGNOSIS — D509 Iron deficiency anemia, unspecified: Secondary | ICD-10-CM | POA: Diagnosis not present

## 2016-12-19 DIAGNOSIS — N182 Chronic kidney disease, stage 2 (mild): Secondary | ICD-10-CM | POA: Diagnosis not present

## 2016-12-19 DIAGNOSIS — K5790 Diverticulosis of intestine, part unspecified, without perforation or abscess without bleeding: Secondary | ICD-10-CM | POA: Diagnosis not present

## 2016-12-19 DIAGNOSIS — L8990 Pressure ulcer of unspecified site, unspecified stage: Secondary | ICD-10-CM | POA: Diagnosis not present

## 2016-12-23 DIAGNOSIS — K5790 Diverticulosis of intestine, part unspecified, without perforation or abscess without bleeding: Secondary | ICD-10-CM | POA: Diagnosis not present

## 2016-12-23 DIAGNOSIS — I130 Hypertensive heart and chronic kidney disease with heart failure and stage 1 through stage 4 chronic kidney disease, or unspecified chronic kidney disease: Secondary | ICD-10-CM | POA: Diagnosis not present

## 2016-12-23 DIAGNOSIS — I5032 Chronic diastolic (congestive) heart failure: Secondary | ICD-10-CM | POA: Diagnosis not present

## 2016-12-23 DIAGNOSIS — L8990 Pressure ulcer of unspecified site, unspecified stage: Secondary | ICD-10-CM | POA: Diagnosis not present

## 2016-12-23 DIAGNOSIS — N182 Chronic kidney disease, stage 2 (mild): Secondary | ICD-10-CM | POA: Diagnosis not present

## 2016-12-23 DIAGNOSIS — D509 Iron deficiency anemia, unspecified: Secondary | ICD-10-CM | POA: Diagnosis not present

## 2016-12-25 DIAGNOSIS — K5790 Diverticulosis of intestine, part unspecified, without perforation or abscess without bleeding: Secondary | ICD-10-CM | POA: Diagnosis not present

## 2016-12-25 DIAGNOSIS — I5032 Chronic diastolic (congestive) heart failure: Secondary | ICD-10-CM | POA: Diagnosis not present

## 2016-12-25 DIAGNOSIS — N182 Chronic kidney disease, stage 2 (mild): Secondary | ICD-10-CM | POA: Diagnosis not present

## 2016-12-25 DIAGNOSIS — I130 Hypertensive heart and chronic kidney disease with heart failure and stage 1 through stage 4 chronic kidney disease, or unspecified chronic kidney disease: Secondary | ICD-10-CM | POA: Diagnosis not present

## 2016-12-25 DIAGNOSIS — L8990 Pressure ulcer of unspecified site, unspecified stage: Secondary | ICD-10-CM | POA: Diagnosis not present

## 2016-12-25 DIAGNOSIS — D509 Iron deficiency anemia, unspecified: Secondary | ICD-10-CM | POA: Diagnosis not present

## 2016-12-27 DIAGNOSIS — L8990 Pressure ulcer of unspecified site, unspecified stage: Secondary | ICD-10-CM | POA: Diagnosis not present

## 2016-12-27 DIAGNOSIS — D509 Iron deficiency anemia, unspecified: Secondary | ICD-10-CM | POA: Diagnosis not present

## 2016-12-27 DIAGNOSIS — N182 Chronic kidney disease, stage 2 (mild): Secondary | ICD-10-CM | POA: Diagnosis not present

## 2016-12-27 DIAGNOSIS — I5032 Chronic diastolic (congestive) heart failure: Secondary | ICD-10-CM | POA: Diagnosis not present

## 2016-12-27 DIAGNOSIS — I130 Hypertensive heart and chronic kidney disease with heart failure and stage 1 through stage 4 chronic kidney disease, or unspecified chronic kidney disease: Secondary | ICD-10-CM | POA: Diagnosis not present

## 2016-12-27 DIAGNOSIS — K5790 Diverticulosis of intestine, part unspecified, without perforation or abscess without bleeding: Secondary | ICD-10-CM | POA: Diagnosis not present

## 2016-12-30 DIAGNOSIS — L8990 Pressure ulcer of unspecified site, unspecified stage: Secondary | ICD-10-CM | POA: Diagnosis not present

## 2016-12-30 DIAGNOSIS — K5790 Diverticulosis of intestine, part unspecified, without perforation or abscess without bleeding: Secondary | ICD-10-CM | POA: Diagnosis not present

## 2016-12-30 DIAGNOSIS — I5032 Chronic diastolic (congestive) heart failure: Secondary | ICD-10-CM | POA: Diagnosis not present

## 2016-12-30 DIAGNOSIS — D509 Iron deficiency anemia, unspecified: Secondary | ICD-10-CM | POA: Diagnosis not present

## 2016-12-30 DIAGNOSIS — I130 Hypertensive heart and chronic kidney disease with heart failure and stage 1 through stage 4 chronic kidney disease, or unspecified chronic kidney disease: Secondary | ICD-10-CM | POA: Diagnosis not present

## 2016-12-30 DIAGNOSIS — N182 Chronic kidney disease, stage 2 (mild): Secondary | ICD-10-CM | POA: Diagnosis not present

## 2016-12-31 DIAGNOSIS — K5790 Diverticulosis of intestine, part unspecified, without perforation or abscess without bleeding: Secondary | ICD-10-CM | POA: Diagnosis not present

## 2016-12-31 DIAGNOSIS — I5032 Chronic diastolic (congestive) heart failure: Secondary | ICD-10-CM | POA: Diagnosis not present

## 2016-12-31 DIAGNOSIS — I130 Hypertensive heart and chronic kidney disease with heart failure and stage 1 through stage 4 chronic kidney disease, or unspecified chronic kidney disease: Secondary | ICD-10-CM | POA: Diagnosis not present

## 2016-12-31 DIAGNOSIS — D509 Iron deficiency anemia, unspecified: Secondary | ICD-10-CM | POA: Diagnosis not present

## 2016-12-31 DIAGNOSIS — N182 Chronic kidney disease, stage 2 (mild): Secondary | ICD-10-CM | POA: Diagnosis not present

## 2016-12-31 DIAGNOSIS — L8990 Pressure ulcer of unspecified site, unspecified stage: Secondary | ICD-10-CM | POA: Diagnosis not present

## 2017-01-02 DIAGNOSIS — N182 Chronic kidney disease, stage 2 (mild): Secondary | ICD-10-CM | POA: Diagnosis not present

## 2017-01-02 DIAGNOSIS — D509 Iron deficiency anemia, unspecified: Secondary | ICD-10-CM | POA: Diagnosis not present

## 2017-01-02 DIAGNOSIS — L8990 Pressure ulcer of unspecified site, unspecified stage: Secondary | ICD-10-CM | POA: Diagnosis not present

## 2017-01-02 DIAGNOSIS — I5032 Chronic diastolic (congestive) heart failure: Secondary | ICD-10-CM | POA: Diagnosis not present

## 2017-01-02 DIAGNOSIS — K5790 Diverticulosis of intestine, part unspecified, without perforation or abscess without bleeding: Secondary | ICD-10-CM | POA: Diagnosis not present

## 2017-01-02 DIAGNOSIS — I130 Hypertensive heart and chronic kidney disease with heart failure and stage 1 through stage 4 chronic kidney disease, or unspecified chronic kidney disease: Secondary | ICD-10-CM | POA: Diagnosis not present

## 2017-01-06 DIAGNOSIS — L8990 Pressure ulcer of unspecified site, unspecified stage: Secondary | ICD-10-CM | POA: Diagnosis not present

## 2017-01-06 DIAGNOSIS — I130 Hypertensive heart and chronic kidney disease with heart failure and stage 1 through stage 4 chronic kidney disease, or unspecified chronic kidney disease: Secondary | ICD-10-CM | POA: Diagnosis not present

## 2017-01-06 DIAGNOSIS — I5032 Chronic diastolic (congestive) heart failure: Secondary | ICD-10-CM | POA: Diagnosis not present

## 2017-01-06 DIAGNOSIS — N182 Chronic kidney disease, stage 2 (mild): Secondary | ICD-10-CM | POA: Diagnosis not present

## 2017-01-06 DIAGNOSIS — K5790 Diverticulosis of intestine, part unspecified, without perforation or abscess without bleeding: Secondary | ICD-10-CM | POA: Diagnosis not present

## 2017-01-06 DIAGNOSIS — D509 Iron deficiency anemia, unspecified: Secondary | ICD-10-CM | POA: Diagnosis not present

## 2017-01-09 DIAGNOSIS — I130 Hypertensive heart and chronic kidney disease with heart failure and stage 1 through stage 4 chronic kidney disease, or unspecified chronic kidney disease: Secondary | ICD-10-CM | POA: Diagnosis not present

## 2017-01-09 DIAGNOSIS — L8990 Pressure ulcer of unspecified site, unspecified stage: Secondary | ICD-10-CM | POA: Diagnosis not present

## 2017-01-09 DIAGNOSIS — N182 Chronic kidney disease, stage 2 (mild): Secondary | ICD-10-CM | POA: Diagnosis not present

## 2017-01-09 DIAGNOSIS — K5790 Diverticulosis of intestine, part unspecified, without perforation or abscess without bleeding: Secondary | ICD-10-CM | POA: Diagnosis not present

## 2017-01-09 DIAGNOSIS — I5032 Chronic diastolic (congestive) heart failure: Secondary | ICD-10-CM | POA: Diagnosis not present

## 2017-01-09 DIAGNOSIS — D509 Iron deficiency anemia, unspecified: Secondary | ICD-10-CM | POA: Diagnosis not present

## 2017-01-13 DIAGNOSIS — I5032 Chronic diastolic (congestive) heart failure: Secondary | ICD-10-CM | POA: Diagnosis not present

## 2017-01-13 DIAGNOSIS — I130 Hypertensive heart and chronic kidney disease with heart failure and stage 1 through stage 4 chronic kidney disease, or unspecified chronic kidney disease: Secondary | ICD-10-CM | POA: Diagnosis not present

## 2017-01-13 DIAGNOSIS — D509 Iron deficiency anemia, unspecified: Secondary | ICD-10-CM | POA: Diagnosis not present

## 2017-01-13 DIAGNOSIS — N182 Chronic kidney disease, stage 2 (mild): Secondary | ICD-10-CM | POA: Diagnosis not present

## 2017-01-13 DIAGNOSIS — K5790 Diverticulosis of intestine, part unspecified, without perforation or abscess without bleeding: Secondary | ICD-10-CM | POA: Diagnosis not present

## 2017-01-13 DIAGNOSIS — L8990 Pressure ulcer of unspecified site, unspecified stage: Secondary | ICD-10-CM | POA: Diagnosis not present

## 2017-01-14 DIAGNOSIS — L8990 Pressure ulcer of unspecified site, unspecified stage: Secondary | ICD-10-CM | POA: Diagnosis not present

## 2017-01-14 DIAGNOSIS — N182 Chronic kidney disease, stage 2 (mild): Secondary | ICD-10-CM | POA: Diagnosis not present

## 2017-01-14 DIAGNOSIS — K5790 Diverticulosis of intestine, part unspecified, without perforation or abscess without bleeding: Secondary | ICD-10-CM | POA: Diagnosis not present

## 2017-01-14 DIAGNOSIS — I5032 Chronic diastolic (congestive) heart failure: Secondary | ICD-10-CM | POA: Diagnosis not present

## 2017-01-14 DIAGNOSIS — D509 Iron deficiency anemia, unspecified: Secondary | ICD-10-CM | POA: Diagnosis not present

## 2017-01-14 DIAGNOSIS — I130 Hypertensive heart and chronic kidney disease with heart failure and stage 1 through stage 4 chronic kidney disease, or unspecified chronic kidney disease: Secondary | ICD-10-CM | POA: Diagnosis not present

## 2017-01-15 DIAGNOSIS — L8990 Pressure ulcer of unspecified site, unspecified stage: Secondary | ICD-10-CM | POA: Diagnosis not present

## 2017-01-15 DIAGNOSIS — D509 Iron deficiency anemia, unspecified: Secondary | ICD-10-CM | POA: Diagnosis not present

## 2017-01-15 DIAGNOSIS — I5032 Chronic diastolic (congestive) heart failure: Secondary | ICD-10-CM | POA: Diagnosis not present

## 2017-01-15 DIAGNOSIS — I130 Hypertensive heart and chronic kidney disease with heart failure and stage 1 through stage 4 chronic kidney disease, or unspecified chronic kidney disease: Secondary | ICD-10-CM | POA: Diagnosis not present

## 2017-01-15 DIAGNOSIS — N182 Chronic kidney disease, stage 2 (mild): Secondary | ICD-10-CM | POA: Diagnosis not present

## 2017-01-15 DIAGNOSIS — K5790 Diverticulosis of intestine, part unspecified, without perforation or abscess without bleeding: Secondary | ICD-10-CM | POA: Diagnosis not present

## 2017-01-16 DIAGNOSIS — K5790 Diverticulosis of intestine, part unspecified, without perforation or abscess without bleeding: Secondary | ICD-10-CM | POA: Diagnosis not present

## 2017-01-16 DIAGNOSIS — D509 Iron deficiency anemia, unspecified: Secondary | ICD-10-CM | POA: Diagnosis not present

## 2017-01-16 DIAGNOSIS — I5032 Chronic diastolic (congestive) heart failure: Secondary | ICD-10-CM | POA: Diagnosis not present

## 2017-01-16 DIAGNOSIS — N182 Chronic kidney disease, stage 2 (mild): Secondary | ICD-10-CM | POA: Diagnosis not present

## 2017-01-16 DIAGNOSIS — I130 Hypertensive heart and chronic kidney disease with heart failure and stage 1 through stage 4 chronic kidney disease, or unspecified chronic kidney disease: Secondary | ICD-10-CM | POA: Diagnosis not present

## 2017-01-16 DIAGNOSIS — L8990 Pressure ulcer of unspecified site, unspecified stage: Secondary | ICD-10-CM | POA: Diagnosis not present

## 2017-01-21 DIAGNOSIS — K5790 Diverticulosis of intestine, part unspecified, without perforation or abscess without bleeding: Secondary | ICD-10-CM | POA: Diagnosis not present

## 2017-01-21 DIAGNOSIS — N182 Chronic kidney disease, stage 2 (mild): Secondary | ICD-10-CM | POA: Diagnosis not present

## 2017-01-21 DIAGNOSIS — D509 Iron deficiency anemia, unspecified: Secondary | ICD-10-CM | POA: Diagnosis not present

## 2017-01-21 DIAGNOSIS — L8990 Pressure ulcer of unspecified site, unspecified stage: Secondary | ICD-10-CM | POA: Diagnosis not present

## 2017-01-21 DIAGNOSIS — I5032 Chronic diastolic (congestive) heart failure: Secondary | ICD-10-CM | POA: Diagnosis not present

## 2017-01-21 DIAGNOSIS — I130 Hypertensive heart and chronic kidney disease with heart failure and stage 1 through stage 4 chronic kidney disease, or unspecified chronic kidney disease: Secondary | ICD-10-CM | POA: Diagnosis not present

## 2017-01-24 DIAGNOSIS — I130 Hypertensive heart and chronic kidney disease with heart failure and stage 1 through stage 4 chronic kidney disease, or unspecified chronic kidney disease: Secondary | ICD-10-CM | POA: Diagnosis not present

## 2017-01-24 DIAGNOSIS — N182 Chronic kidney disease, stage 2 (mild): Secondary | ICD-10-CM | POA: Diagnosis not present

## 2017-01-24 DIAGNOSIS — D509 Iron deficiency anemia, unspecified: Secondary | ICD-10-CM | POA: Diagnosis not present

## 2017-01-24 DIAGNOSIS — I5032 Chronic diastolic (congestive) heart failure: Secondary | ICD-10-CM | POA: Diagnosis not present

## 2017-01-24 DIAGNOSIS — K5790 Diverticulosis of intestine, part unspecified, without perforation or abscess without bleeding: Secondary | ICD-10-CM | POA: Diagnosis not present

## 2017-01-24 DIAGNOSIS — L8990 Pressure ulcer of unspecified site, unspecified stage: Secondary | ICD-10-CM | POA: Diagnosis not present

## 2017-01-27 DIAGNOSIS — D509 Iron deficiency anemia, unspecified: Secondary | ICD-10-CM | POA: Diagnosis not present

## 2017-01-27 DIAGNOSIS — L8990 Pressure ulcer of unspecified site, unspecified stage: Secondary | ICD-10-CM | POA: Diagnosis not present

## 2017-01-27 DIAGNOSIS — K5790 Diverticulosis of intestine, part unspecified, without perforation or abscess without bleeding: Secondary | ICD-10-CM | POA: Diagnosis not present

## 2017-01-27 DIAGNOSIS — I5032 Chronic diastolic (congestive) heart failure: Secondary | ICD-10-CM | POA: Diagnosis not present

## 2017-01-27 DIAGNOSIS — N182 Chronic kidney disease, stage 2 (mild): Secondary | ICD-10-CM | POA: Diagnosis not present

## 2017-01-27 DIAGNOSIS — I130 Hypertensive heart and chronic kidney disease with heart failure and stage 1 through stage 4 chronic kidney disease, or unspecified chronic kidney disease: Secondary | ICD-10-CM | POA: Diagnosis not present

## 2017-01-28 DIAGNOSIS — L8990 Pressure ulcer of unspecified site, unspecified stage: Secondary | ICD-10-CM | POA: Diagnosis not present

## 2017-01-28 DIAGNOSIS — I5032 Chronic diastolic (congestive) heart failure: Secondary | ICD-10-CM | POA: Diagnosis not present

## 2017-01-28 DIAGNOSIS — I130 Hypertensive heart and chronic kidney disease with heart failure and stage 1 through stage 4 chronic kidney disease, or unspecified chronic kidney disease: Secondary | ICD-10-CM | POA: Diagnosis not present

## 2017-01-28 DIAGNOSIS — D509 Iron deficiency anemia, unspecified: Secondary | ICD-10-CM | POA: Diagnosis not present

## 2017-01-28 DIAGNOSIS — K5790 Diverticulosis of intestine, part unspecified, without perforation or abscess without bleeding: Secondary | ICD-10-CM | POA: Diagnosis not present

## 2017-01-28 DIAGNOSIS — N182 Chronic kidney disease, stage 2 (mild): Secondary | ICD-10-CM | POA: Diagnosis not present

## 2017-01-30 DIAGNOSIS — D509 Iron deficiency anemia, unspecified: Secondary | ICD-10-CM | POA: Diagnosis not present

## 2017-01-30 DIAGNOSIS — L8990 Pressure ulcer of unspecified site, unspecified stage: Secondary | ICD-10-CM | POA: Diagnosis not present

## 2017-01-30 DIAGNOSIS — K5790 Diverticulosis of intestine, part unspecified, without perforation or abscess without bleeding: Secondary | ICD-10-CM | POA: Diagnosis not present

## 2017-01-30 DIAGNOSIS — I5032 Chronic diastolic (congestive) heart failure: Secondary | ICD-10-CM | POA: Diagnosis not present

## 2017-01-30 DIAGNOSIS — N182 Chronic kidney disease, stage 2 (mild): Secondary | ICD-10-CM | POA: Diagnosis not present

## 2017-01-30 DIAGNOSIS — I130 Hypertensive heart and chronic kidney disease with heart failure and stage 1 through stage 4 chronic kidney disease, or unspecified chronic kidney disease: Secondary | ICD-10-CM | POA: Diagnosis not present

## 2017-08-03 ENCOUNTER — Other Ambulatory Visit: Payer: Self-pay

## 2017-08-03 ENCOUNTER — Inpatient Hospital Stay (HOSPITAL_COMMUNITY)
Admission: EM | Admit: 2017-08-03 | Discharge: 2017-08-09 | DRG: 377 | Disposition: A | Discharge status: Skilled Nursing Facility | Payer: Medicare Other | Attending: Internal Medicine | Admitting: Internal Medicine

## 2017-08-03 ENCOUNTER — Encounter (HOSPITAL_COMMUNITY): Payer: Self-pay

## 2017-08-03 DIAGNOSIS — R1312 Dysphagia, oropharyngeal phase: Secondary | ICD-10-CM | POA: Diagnosis present

## 2017-08-03 DIAGNOSIS — F0280 Dementia in other diseases classified elsewhere without behavioral disturbance: Secondary | ICD-10-CM | POA: Diagnosis present

## 2017-08-03 DIAGNOSIS — R627 Adult failure to thrive: Secondary | ICD-10-CM | POA: Diagnosis present

## 2017-08-03 DIAGNOSIS — D62 Acute posthemorrhagic anemia: Secondary | ICD-10-CM | POA: Diagnosis present

## 2017-08-03 DIAGNOSIS — D696 Thrombocytopenia, unspecified: Secondary | ICD-10-CM | POA: Diagnosis present

## 2017-08-03 DIAGNOSIS — K625 Hemorrhage of anus and rectum: Secondary | ICD-10-CM | POA: Diagnosis not present

## 2017-08-03 DIAGNOSIS — Z66 Do not resuscitate: Secondary | ICD-10-CM | POA: Diagnosis present

## 2017-08-03 DIAGNOSIS — K5791 Diverticulosis of intestine, part unspecified, without perforation or abscess with bleeding: Secondary | ICD-10-CM | POA: Diagnosis not present

## 2017-08-03 DIAGNOSIS — D509 Iron deficiency anemia, unspecified: Secondary | ICD-10-CM | POA: Diagnosis present

## 2017-08-03 DIAGNOSIS — K219 Gastro-esophageal reflux disease without esophagitis: Secondary | ICD-10-CM | POA: Diagnosis not present

## 2017-08-03 DIAGNOSIS — I10 Essential (primary) hypertension: Secondary | ICD-10-CM | POA: Diagnosis not present

## 2017-08-03 DIAGNOSIS — K922 Gastrointestinal hemorrhage, unspecified: Secondary | ICD-10-CM | POA: Diagnosis not present

## 2017-08-03 DIAGNOSIS — R578 Other shock: Secondary | ICD-10-CM | POA: Diagnosis present

## 2017-08-03 DIAGNOSIS — Y92230 Patient room in hospital as the place of occurrence of the external cause: Secondary | ICD-10-CM | POA: Diagnosis not present

## 2017-08-03 DIAGNOSIS — I110 Hypertensive heart disease with heart failure: Secondary | ICD-10-CM | POA: Diagnosis present

## 2017-08-03 DIAGNOSIS — Z86010 Personal history of colonic polyps: Secondary | ICD-10-CM | POA: Diagnosis not present

## 2017-08-03 DIAGNOSIS — R58 Hemorrhage, not elsewhere classified: Secondary | ICD-10-CM | POA: Diagnosis not present

## 2017-08-03 DIAGNOSIS — Z79899 Other long term (current) drug therapy: Secondary | ICD-10-CM | POA: Diagnosis not present

## 2017-08-03 DIAGNOSIS — Z7189 Other specified counseling: Secondary | ICD-10-CM | POA: Diagnosis not present

## 2017-08-03 DIAGNOSIS — K5793 Diverticulitis of intestine, part unspecified, without perforation or abscess with bleeding: Secondary | ICD-10-CM | POA: Diagnosis present

## 2017-08-03 DIAGNOSIS — Z8719 Personal history of other diseases of the digestive system: Secondary | ICD-10-CM | POA: Diagnosis not present

## 2017-08-03 DIAGNOSIS — X58XXXA Exposure to other specified factors, initial encounter: Secondary | ICD-10-CM | POA: Diagnosis not present

## 2017-08-03 DIAGNOSIS — I5032 Chronic diastolic (congestive) heart failure: Secondary | ICD-10-CM | POA: Diagnosis present

## 2017-08-03 DIAGNOSIS — Z515 Encounter for palliative care: Secondary | ICD-10-CM | POA: Diagnosis not present

## 2017-08-03 DIAGNOSIS — M6281 Muscle weakness (generalized): Secondary | ICD-10-CM | POA: Diagnosis present

## 2017-08-03 DIAGNOSIS — Z823 Family history of stroke: Secondary | ICD-10-CM | POA: Diagnosis not present

## 2017-08-03 DIAGNOSIS — E440 Moderate protein-calorie malnutrition: Secondary | ICD-10-CM | POA: Diagnosis present

## 2017-08-03 DIAGNOSIS — S21109A Unspecified open wound of unspecified front wall of thorax without penetration into thoracic cavity, initial encounter: Secondary | ICD-10-CM | POA: Diagnosis not present

## 2017-08-03 DIAGNOSIS — F0390 Unspecified dementia without behavioral disturbance: Secondary | ICD-10-CM | POA: Diagnosis not present

## 2017-08-03 DIAGNOSIS — R42 Dizziness and giddiness: Secondary | ICD-10-CM | POA: Diagnosis not present

## 2017-08-03 DIAGNOSIS — E876 Hypokalemia: Secondary | ICD-10-CM | POA: Diagnosis not present

## 2017-08-03 DIAGNOSIS — R4182 Altered mental status, unspecified: Secondary | ICD-10-CM | POA: Diagnosis not present

## 2017-08-03 DIAGNOSIS — Z681 Body mass index (BMI) 19 or less, adult: Secondary | ICD-10-CM

## 2017-08-03 LAB — POC OCCULT BLOOD, ED: Fecal Occult Bld: POSITIVE — AB

## 2017-08-03 LAB — BASIC METABOLIC PANEL
Anion gap: 8 (ref 5–15)
BUN: 20 mg/dL (ref 6–20)
CHLORIDE: 108 mmol/L (ref 101–111)
CO2: 28 mmol/L (ref 22–32)
CREATININE: 0.78 mg/dL (ref 0.44–1.00)
Calcium: 8.8 mg/dL — ABNORMAL LOW (ref 8.9–10.3)
GFR calc non Af Amer: >60 mL/min (ref >60)
Glucose, Bld: 115 mg/dL — ABNORMAL HIGH (ref 65–99)
Potassium: 4 mmol/L (ref 3.5–5.1)
Sodium: 144 mmol/L (ref 135–145)

## 2017-08-03 LAB — CBC
HEMATOCRIT: 32.3 % — AB (ref 36.0–46.0)
HEMOGLOBIN: 10.4 g/dL — AB (ref 12.0–15.0)
MCH: 31.4 pg (ref 26.0–34.0)
MCHC: 32.2 g/dL (ref 30.0–36.0)
MCV: 97.6 fL (ref 78.0–100.0)
Platelets: 261 10*3/uL (ref 150–400)
RBC: 3.31 MIL/uL — ABNORMAL LOW (ref 3.87–5.11)
RDW: 13.8 % (ref 11.5–15.5)
WBC: 8.1 10*3/uL (ref 4.0–10.5)

## 2017-08-03 LAB — PREPARE RBC (CROSSMATCH)

## 2017-08-03 MED ORDER — ACETAMINOPHEN 325 MG PO TABS: 650.0000 mg | ORAL_TABLET | Freq: Four times a day (QID) | ORAL | Status: DC | PRN

## 2017-08-03 MED ORDER — ONDANSETRON HCL 4 MG/2ML IJ SOLN: 4.0000 mg | Freq: Four times a day (QID) | INTRAMUSCULAR | Status: DC | PRN

## 2017-08-03 MED ORDER — ACETAMINOPHEN 650 MG RE SUPP: 650.0000 mg | Freq: Four times a day (QID) | RECTAL | Status: DC | PRN

## 2017-08-03 MED ORDER — SODIUM CHLORIDE 0.9 % IV SOLN: 10.0000 mL/h | Freq: Once | INTRAVENOUS | Status: DC

## 2017-08-03 MED ORDER — SODIUM CHLORIDE 0.9 % IV SOLN: INTRAVENOUS | Status: DC

## 2017-08-03 MED ORDER — SODIUM CHLORIDE 0.9 % IV SOLN
INTRAVENOUS | Status: DC
  Administered 2017-08-03 – 2017-08-06 (×5): via INTRAVENOUS

## 2017-08-03 MED ORDER — ONDANSETRON HCL 4 MG PO TABS: 4.0000 mg | ORAL_TABLET | Freq: Four times a day (QID) | ORAL | Status: DC | PRN

## 2017-08-03 NOTE — ED Provider Notes (Addendum)
[facility] DEPT Provider Note   CSN: [id number] Arrival date & time: 08/03/17  1559     History   Chief Complaint No chief complaint on file.   HPI Sarah Perkins is a 84 y.o. female.  Patient with hx gi bleeding, diverticula, presents with large amount dark blood per rectum. Onset today. Pt feels lightheaded/dizzy. Denies abdominal pain. No vomiting. No other abnormal bruising or bleeding. No anticoag use. Symptoms moderate, persistent. No specific exacerbating or alleviating factors.   The history is provided by the patient, the EMS personnel and a relative.    Past Medical History:  Diagnosis Date  . Acid reflux    occasional  . Acute blood loss anemia 06/2009   received ~ 6 units blood  . Adenomatous polyp of colon 06/2009  . Aneurysm ([place]) 1972   cerebral  . CHF (congestive heart failure) ([place])   . Diverticulitis   . GI bleed 02/14/2014  . History of GI diverticular bleed 06/2009   colonoscopy with endo clipping of bleeding tic by Dr Clarene Essex.  Flex sig by Dr Paulita Fujita.  . Hypertension   . Tremor    worse on right arm  . UTI (lower urinary tract infection)     Patient Active Problem List   Diagnosis Date Noted  . Pressure injury of skin 12/04/2016  . Hematochezia 12/03/2016  . Sepsis ([place]) 03/27/2016  . Acute respiratory failure with hypoxemia ([place]) 03/27/2016  . Closed fracture of right femur ([place])   . Malnutrition of moderate degree 10/24/2015  . Femur fracture, right ([place]) 10/21/2015  . Duodenal ulcer, acute 06/04/2015  . Essential hypertension 06/02/2015  . Syncope 06/02/2015  . Abdominal pain 06/02/2015  . Elevated lactic acid level 06/02/2015  . Faintness   . Dehydration 03/29/2015  . FTT (failure to thrive) in adult 03/29/2015  . Pressure ulcer 03/23/2015  . CKD (chronic kidney disease) stage 2, GFR 60-89 ml/min 03/23/2015  . Duodenitis without bleeding 03/22/2015  . Azotemia 03/22/2015  . Acute hypokalemia  03/22/2015  . Constipation   . Cough   . Palliative care encounter   . Acute bronchitis 10/08/2014  . Anemia, iron deficiency 10/08/2014  . Rectal bleeding 08/21/2014  . Chronic diastolic CHF (congestive heart failure) ([facility] [place]) 02/22/2014  . Acute GI bleeding 02/14/2014  . Abnormal EKG 02/14/2014  . Dementia 01/28/2013  . Acute posthemorrhagic anemia 09/20/2011  . CHF (congestive heart failure) ([place]) 05/15/2011  . Acute on chronic kidney failure ([place]), stage ii 05/15/2011  . GERD (gastroesophageal reflux disease) 05/15/2011  . Diverticulosis 05/15/2011    Past Surgical History:  Procedure Laterality Date  . CEREBRAL ANEURYSM REPAIR  1970's  . COLONOSCOPY  09/22/2011   Procedure: COLONOSCOPY;  Surgeon: Juanita Craver, MD;  Location: [facility] ENDOSCOPY;  Service: Endoscopy;  Laterality: N/A;  wants ped scope  . COLONOSCOPY W/ CONTROL OF HEMORRHAGE  06/2009  . HIP FRACTURE SURGERY  2011  . KNEE ARTHROSCOPY    . ORIF FEMUR FRACTURE Right 10/22/2015   Procedure: OPEN REDUCTION INTERNAL FIXATION (ORIF) DISTAL FEMUR FRACTURE;  Surgeon: Mcarthur Rossetti, MD;  Location: [facility];  Service: Orthopedics;  Laterality: Right;     OB History   None      Home Medications    Prior to Admission medications   Medication Sig Start Date End Date Taking? Authorizing Provider  acetaminophen (TYLENOL) 325 MG tablet Take 2 tablets (650 mg total) by mouth every 6 (six) hours as needed for mild pain or  moderate pain (or Fever >/= 101). 10/26/15   Domenic Polite, MD  amLODipine (NORVASC) 5 MG tablet Take 5 mg by mouth daily.    [provider]  CALCIUM-MAGNESIUM-ZINC PO Take 1 capsule by mouth daily.    [provider]  cyanocobalamin 100 MCG tablet Take 100 mcg by mouth daily.    [provider]  docusate sodium (COLACE) 100 MG capsule Take 100 mg by mouth at bedtime.    [provider]  feeding supplement (BOOST / RESOURCE BREEZE) LIQD Take 1 Container by mouth 3 (three)  times daily between meals. 12/08/16   Eugenie Filler, MD  ferrous sulfate 325 (65 FE) MG tablet Take 325 mg by mouth daily with breakfast.    [provider]  furosemide (LASIX) 20 MG tablet Take 1 tablet (20 mg total) by mouth daily. 03/31/16   Domenic Polite, MD  hydrocerin (EUCERIN) CREA Apply 1 application topically 2 (two) times daily. 12/08/16   Eugenie Filler, MD  Multiple Vitamin (MULTIVITAMIN) LIQD Take 15 mLs by mouth daily. 12/09/16   Eugenie Filler, MD  neomycin-bacitracin-polymyxin (NEOSPORIN) [phone] ointment Apply topically 4 (four) times daily as needed (sores).    [provider]  pantoprazole (PROTONIX) 40 MG tablet Take 1 tablet (40 mg total) by mouth daily. 06/08/15   Florencia Reasons, MD  polyethylene glycol [facility] / Floria Raveling) packet Take 17 g by mouth every morning.    [provider]  potassium chloride SA (K-DUR,KLOR-CON) 20 MEQ tablet Take 2 tablets (40 mEq total) by mouth daily. 03/31/16   Domenic Polite, MD  vitamin C (ASCORBIC ACID) 500 MG tablet Take 500 mg by mouth daily.    [provider]    Family History Family History  Problem Relation Age of Onset  . Stroke Mother   . Lung disease Father   . Stroke Brother     Social History Social History   Tobacco Use  . Smoking status: Never Smoker  . Smokeless tobacco: Never Used  Substance Use Topics  . Alcohol use: No  . Drug use: No     Allergies   Patient has no known allergies.   Review of Systems Review of Systems  Constitutional: Negative for fever.  HENT: Negative for nosebleeds.   Eyes: Negative for redness.  Respiratory: Negative for shortness of breath.   Cardiovascular: Negative for chest pain.  Gastrointestinal: Positive for blood in stool. Negative for abdominal pain and vomiting.  Genitourinary: Negative for flank pain.  Musculoskeletal: Negative for back pain.  Skin: Negative for rash.  Neurological: Positive for light-headedness.    Hematological: Does not bruise/bleed easily.  Psychiatric/Behavioral: Negative for confusion.     Physical Exam Updated Vital Signs BP (!) 122/56 (BP Location: Left Arm)   Pulse 68   Temp 97.8 F (36.6 C) (Oral)   Resp 18   SpO2 96%   Physical Exam  Constitutional: She appears well-developed and well-nourished. No distress.  HENT:  Mouth/Throat: Oropharynx is clear and moist.  Eyes: Conjunctivae are normal. No scleral icterus.  Neck: Neck supple. No tracheal deviation present.  Cardiovascular: Normal rate, regular rhythm, normal heart sounds and intact distal pulses.  Pulmonary/Chest: Effort normal and breath sounds normal. No respiratory distress.  Abdominal: Soft. Normal appearance and bowel sounds are normal. She exhibits no distension. There is no tenderness.  Genitourinary:  Genitourinary Comments: V dark maroon stool, heme pos.   Musculoskeletal: She exhibits no edema.  Neurological: She is alert.  Skin: Skin is  warm and dry. No rash noted. She is not diaphoretic.  Psychiatric: She has a normal mood and affect.  Nursing note and vitals reviewed.    ED Treatments / Results  Labs (all labs ordered are listed, but only abnormal results are displayed) Results for orders placed or performed during the hospital encounter of 08/03/17  CBC  Result Value Ref Range   WBC 8.1 4.0 - 10.5 K/uL   RBC 3.31 (L) 3.87 - 5.11 MIL/uL   Hemoglobin 10.4 (L) 12.0 - 15.0 g/dL   HCT 32.3 (L) 36.0 - 46.0 %   MCV 97.6 78.0 - 100.0 fL   MCH 31.4 26.0 - 34.0 pg   MCHC 32.2 30.0 - 36.0 g/dL   RDW 13.8 11.5 - 15.5 %   Platelets 261 150 - 400 K/uL  Basic metabolic panel  Result Value Ref Range   Sodium 144 135 - 145 mmol/L   Potassium 4.0 3.5 - 5.1 mmol/L   Chloride 108 101 - 111 mmol/L   CO2 28 22 - 32 mmol/L   Glucose, Bld 115 (H) 65 - 99 mg/dL   BUN 20 6 - 20 mg/dL   Creatinine, Ser 0.78 0.44 - 1.00 mg/dL   Calcium 8.8 (L) 8.9 - 10.3 mg/dL   GFR calc non Af Amer >60 >60 mL/min    GFR calc Af Amer >60 >60 mL/min   Anion gap 8 5 - 15  POC occult blood, ED Provider will collect  Result Value Ref Range   Fecal Occult Bld POSITIVE (A) NEGATIVE  Type and screen  Result Value Ref Range   ABO/RH(D) B POS    Antibody Screen PENDING    Sample Expiration      08/06/2017 Performed at [facility], [place] [address], [place],  [address]     EKG None  Radiology No results found.  Procedures Procedures (including critical care time)  Medications Ordered in ED Medications  0.9 %  sodium chloride infusion (has no administration in time range)     Initial Impression / Assessment and Plan / ED Course  I have reviewed the triage vital signs and the nursing notes.  Pertinent labs & imaging results that were available during my care of the patient were reviewed by me and considered in my medical decision making (see chart for details).  Iv ns. Labs sent. Continuous pulse ox and monitor.   Reviewed nursing notes and prior charts for additional history.   Labs reviewed - initial hgb ok, c/w prior.  On repeat exam, pt with another large dark maroon stool.   Given heavy, recurrent bleeding - will admit.  Pt/family note that they have been told by gi in past, that in event of recurrent bleeding, admission for supportive tx, but that not candidate for recurrent colonoscopy.   Hospitalists consulted for admission.  Multiple rechecks - another large dark maroon bm. Now bp low. Ns bolus. Awaiting prbc.   Given ongoing bleeding, and bp lower than prior, will transfuse.   Recheck bp in 70's. Pt weak/near syncopal.  2nd iv line. Stat transfusion additional units prbc.   CRITICAL CARE  RE: acute, severe gi bleeding, hemor shock, requiring emergent transfusion multiple units prbc.  Performed by: Mirna Mires Total critical care time: 70  minutes Critical care time was exclusive of separately billable procedures and treating other  patients. Critical care was necessary to treat or prevent imminent or life-threatening deterioration. Critical care was time spent personally by me on the  following activities: development of treatment plan with patient and/or surrogate as well as nursing, discussions with consultants, evaluation of patient's response to treatment, examination of patient, obtaining history from patient or surrogate, ordering and performing treatments and interventions, ordering and review of laboratory studies, ordering and review of radiographic studies, pulse oximetry and re-evaluation of patient's condition.    Final Clinical Impressions(s) / ED Diagnoses   Final diagnoses:  None    ED Discharge Orders    None           Lajean Saver, MD 08/03/17 2058

## 2017-08-03 NOTE — Progress Notes (Signed)
Patient clinically improved after 1L NS bolus and 1u PRBC rapid infusion.  No further hematochezia for several hours now.  SBP 120s.  Will do 2nd unit at slower rate (over 3 hours), and hold 3rd unit in blood bank (unless hematochezia resumes or patient becomes hypotensive again).

## 2017-08-03 NOTE — ED Triage Notes (Signed)
She is pain-free. She lives with her son, who is her caregiver. He reports pt. Began to have dark bloody stools today. She arrives here in no distress.

## 2017-08-03 NOTE — H&P (Signed)
History and Physical    Sarah Perkins KDT:[id number] DOB: 06-18-1917 DOA: 08/03/2017  PCP: Thressa Sheller, MD  Patient coming from: Home  I have personally briefly reviewed patient's old medical records in [facility]  Chief Complaint: BRBPR  HPI: Sarah Perkins is a 84 y.o. female with medical history significant of mild dementia, HTN, recurrent diverticular bleeds in past, last was Aug 2018.  Patient treated with transfusions and supportive care at that time.  Patient not felt to be candidate for further colonoscopies and certianly not surgery (see extensive notes in chart re this).  Presents to ED after onset this afternoon of multiple episodes of BRBPR, large amount of blood loss, had episode in ED.   ED Course: While I was in room, dropped pressures to 60s and had decreased LOC, IVF now going wide open and mental status improved and she is talking again.  Pain free.  Initial HGB 99.I but certianly hasnt had chance to equilibrate yet.   Review of Systems: As per HPI otherwise 10 point review of systems negative.   Past Medical History:  Diagnosis Date  . Acid reflux    occasional  . Acute blood loss anemia 06/2009   received ~ 6 units blood  . Adenomatous polyp of colon 06/2009  . Aneurysm ([place]) 1972   cerebral  . CHF (congestive heart failure) ([place])   . Diverticulitis   . GI bleed 02/14/2014  . History of GI diverticular bleed 06/2009   colonoscopy with endo clipping of bleeding tic by Dr Clarene Essex.  Flex sig by Dr Paulita Fujita.  . Hypertension   . Tremor    worse on right arm  . UTI (lower urinary tract infection)     Past Surgical History:  Procedure Laterality Date  . CEREBRAL ANEURYSM REPAIR  1970's  . COLONOSCOPY  09/22/2011   Procedure: COLONOSCOPY;  Surgeon: Juanita Craver, MD;  [facility] END[facility]COPY;  Service: Endoscopy;  Laterality: N/A;  wants ped scope  . COLONOSCOPY W/ CONTROL OF HEMORRHAGE  06/2009  . HIP FRACTURE SURGERY  2011  . KNEE  ARTHROSCOPY    . ORIF FEMUR FRACTURE Right 10/22/2015   Procedure: OPEN REDUCTION INTERNAL FIXATION (ORIF) DISTAL FEMUR FRACTURE;  Surgeon: Mcarthur Rossetti, MD;  Location: [facility];  Service: Orthopedics;  Laterality: Right;     reports that she has never smoked. She has never used smokeless tobacco. She reports that she does not drink alcohol or use drugs.  No Known Allergies  Family History  Problem Relation Age of Onset  . Stroke Mother   . Lung disease Father   . Stroke Brother      Prior to Admission medications   Medication Sig Start Date End Date Taking? Authorizing Provider  acetaminophen (TYLENOL) 325 MG tablet Take 2 tablets (650 mg total) by mouth every 6 (six) hours as needed for mild pain or moderate pain (or Fever >/= 101). 10/26/15  Yes Domenic Polite, MD  amLODipine (NORVASC) 5 MG tablet Take 5 mg by mouth daily.   Yes [provider]  CALCIUM-MAGNESIUM-ZINC PO Take 1 capsule by mouth daily.   Yes [provider]  cyanocobalamin 100 MCG tablet Take 100 mcg by mouth daily.   Yes [provider]  docusate sodium (COLACE) 100 MG capsule Take 100 mg by mouth at bedtime.   Yes [provider]  ferrous sulfate 325 (65 FE) MG tablet Take 325 mg by mouth daily with breakfast.   Yes [provider]  furosemide (LASIX) 20 MG tablet Take 1 tablet (20 mg total) by mouth daily. 03/31/16  Yes Domenic Polite, MD  hydrocerin (EUCERIN) CREA Apply 1 application topically 2 (two) times daily. 12/08/16  Yes Eugenie Filler, MD  Multiple Vitamin (MULTIVITAMIN) LIQD Take 15 mLs by mouth daily. 12/09/16  Yes Eugenie Filler, MD  neomycin-bacitracin-polymyxin (NEOSPORIN) [phone] ointment Apply topically 4 (four) times daily as needed (sores).   Yes [provider]  pantoprazole (PROTONIX) 40 MG tablet Take 1 tablet (40 mg total) by mouth daily. 06/08/15  Yes Florencia Reasons, MD  polyethylene glycol [facility] / GLYCOLAX) packet Take 17 g by  mouth every morning.   Yes [provider]  vitamin C (ASCORBIC ACID) 500 MG tablet Take 500 mg by mouth daily.   Yes [provider]  feeding supplement (BOOST / RESOURCE BREEZE) LIQD Take 1 Container by mouth 3 (three) times daily between meals. 12/08/16   Eugenie Filler, MD  potassium chloride SA (K-DUR,KLOR-CON) 20 MEQ tablet Take 2 tablets (40 mEq total) by mouth daily. Patient not taking: Reported on 08/03/2017 03/31/16   Domenic Polite, MD    Physical Exam: Vitals:   08/03/17 1700 08/03/17 1730 08/03/17 1800 08/03/17 1938  BP: (!) 127/54 131/66 (!) 117/51 (!) 97/55  Pulse: 72 79 75 96  Resp:    18  Temp:      TempSrc:      SpO2: 94% 97% 96% 95%    Constitutional: NAD, calm, comfortable Eyes: PERRL, lids and conjunctivae normal ENMT: Mucous membranes are moist. Posterior pharynx clear of any exudate or lesions.Normal dentition.  Neck: normal, supple, no masses, no thyromegaly Respiratory: clear to auscultation bilaterally, no wheezing, no crackles. Normal respiratory effort. No accessory muscle use.  Cardiovascular: Regular rate and rhythm, no murmurs / rubs / gallops. No extremity edema. 2+ pedal pulses. No carotid bruits.  Abdomen: no tenderness, no masses palpated. No hepatosplenomegaly. Bowel sounds positive.  Musculoskeletal: no clubbing / cyanosis. No joint deformity upper and lower extremities. Good ROM, no contractures. Normal muscle tone.  Skin: no rashes, lesions, ulcers. No induration Neurologic: CN 2-12 grossly intact. Sensation intact, DTR normal. Strength 5/5 in all 4.  Psychiatric: Normal judgment and insight. Alert and oriented x 3. Normal mood.    Labs on Admission: I have personally reviewed following labs and imaging studies  CBC: Recent Labs  Lab 08/03/17 1749  WBC 8.1  HGB 10.4*  HCT 32.3*  MCV 97.6  PLT 761   Basic Metabolic Panel: Recent Labs  Lab 08/03/17 1749  NA 144  K 4.0  CL 108  CO2 28  GLUCOSE 115*  BUN 20    CREATININE 0.78  CALCIUM 8.8*   GFR: CrCl cannot be calculated (Unknown ideal weight.). Liver Function Tests: No results for input(s): AST, ALT, ALKPHOS, BILITOT, PROT, ALBUMIN in the last 168 hours. No results for input(s): LIPASE, AMYLASE in the last 168 hours. No results for input(s): AMMONIA in the last 168 hours. Coagulation Profile: No results for input(s): INR, PROTIME in the last 168 hours. Cardiac Enzymes: No results for input(s): CKTOTAL, CKMB, CKMBINDEX, TROPONINI in the last 168 hours. BNP (last 3 results) No results for input(s): PROBNP in the last 8760 hours. HbA1C: No results for input(s): HGBA1C in the last 72 hours. CBG: No results for input(s): GLUCAP in the last 168 hours. Lipid Profile: No results for input(s): CHOL, HDL, LDLCALC, TRIG, CHOLHDL, LDLDIRECT in the last 72 hours. Thyroid Function Tests: No results for input(s):  TSH, T4TOTAL, FREET4, T3FREE, THYROIDAB in the last 72 hours. Anemia Panel: No results for input(s): VITAMINB12, FOLATE, FERRITIN, TIBC, IRON, RETICCTPCT in the last 72 hours. Urine analysis:    Component Value Date/Time   COLORURINE YELLOW 12/05/2016 0520   APPEARANCEUR CLEAR 12/05/2016 0520   LABSPEC 1.010 12/05/2016 0520   PHURINE 5.0 12/05/2016 0520   GLUCOSEU NEGATIVE 12/05/2016 0520   HGBUR LARGE (A) 12/05/2016 0520   BILIRUBINUR NEGATIVE 12/05/2016 0520   KETONESUR NEGATIVE 12/05/2016 0520   PROTEINUR NEGATIVE 12/05/2016 0520   UROBILINOGEN 0.2 10/08/2014 1225   NITRITE NEGATIVE 12/05/2016 0520   LEUKOCYTESUR NEGATIVE 12/05/2016 0520    Radiological Exams on Admission: No results found.  EKG: Independently reviewed.  Assessment/Plan Principal Problem:   Hemorrhagic shock (HCC) Active Problems:   Palliative care encounter   Acute lower GI bleeding    1. Hemorrhagic shock due to LGIB, likely recurrent diverticular bleed - 1. 1L NS bolus going wide open right now 2. 3u PRBC transfusion pending, RN checking with  blood bank right now to see where this is. 1. UPDATE: patient has antibodies from prior transfusions, ETA ~1hr on blood it seems 3. Have 2 IV sites in place 4. Not candidate for surgery nor colonoscopy (see prior admit discussions).  Son understands she wouldn't survive these given age and frailty. 5. Discussed with son: as with previous times he wants supportive treatment (fluid, blood transfusion), and hope that this stops as it has in the past.  Otherwise he wants her comfortable.  No heroics. 6. Repeat CBC/BMP in AM.  DVT prophylaxis: SCDs Code Status: DNR Family Communication: Son at bedside Disposition Plan: Home after admit Consults called: None Admission status: Admit to inaptient   Etta Quill DO [facility] Pager [phone]  If 7AM-7PM, please contact day team taking care of patient [email or web address] Password [email or web address]  08/03/2017, 8:29 PM

## 2017-08-04 ENCOUNTER — Encounter (HOSPITAL_COMMUNITY): Payer: Self-pay

## 2017-08-04 DIAGNOSIS — Z8719 Personal history of other diseases of the digestive system: Secondary | ICD-10-CM

## 2017-08-04 DIAGNOSIS — R578 Other shock: Secondary | ICD-10-CM

## 2017-08-04 DIAGNOSIS — K922 Gastrointestinal hemorrhage, unspecified: Secondary | ICD-10-CM

## 2017-08-04 LAB — BASIC METABOLIC PANEL
Anion gap: 8 (ref 5–15)
BUN: 18 mg/dL (ref 6–20)
CHLORIDE: 111 mmol/L (ref 101–111)
CO2: 24 mmol/L (ref 22–32)
CREATININE: 0.78 mg/dL (ref 0.44–1.00)
Calcium: 7.5 mg/dL — ABNORMAL LOW (ref 8.9–10.3)
GFR calc Af Amer: >60 mL/min (ref >60)
GFR calc non Af Amer: >60 mL/min (ref >60)
GLUCOSE: 192 mg/dL — AB (ref 65–99)
POTASSIUM: 6.1 mmol/L — AB (ref 3.5–5.1)
Sodium: 143 mmol/L (ref 135–145)

## 2017-08-04 LAB — CBC
HCT: 32.8 % — ABNORMAL LOW (ref 36.0–46.0)
HEMOGLOBIN: 10.7 g/dL — AB (ref 12.0–15.0)
MCH: 31.2 pg (ref 26.0–34.0)
MCHC: 32.6 g/dL (ref 30.0–36.0)
MCV: 95.6 fL (ref 78.0–100.0)
Platelets: 162 10*3/uL (ref 150–400)
RBC: 3.43 MIL/uL — AB (ref 3.87–5.11)
RDW: 14.7 % (ref 11.5–15.5)
WBC: 8.6 10*3/uL (ref 4.0–10.5)

## 2017-08-04 LAB — HEMOGLOBIN AND HEMATOCRIT, BLOOD
HCT: 26.4 % — ABNORMAL LOW (ref 36.0–46.0)
HEMATOCRIT: 25.6 % — AB (ref 36.0–46.0)
HEMOGLOBIN: 8.5 g/dL — AB (ref 12.0–15.0)
Hemoglobin: 8.7 g/dL — ABNORMAL LOW (ref 12.0–15.0)

## 2017-08-04 LAB — PREPARE RBC (CROSSMATCH)

## 2017-08-04 LAB — POTASSIUM: Potassium: 3.2 mmol/L — ABNORMAL LOW (ref 3.5–5.1)

## 2017-08-04 MED ORDER — SODIUM CHLORIDE 0.9 % IV BOLUS
1000.0000 mL | Freq: Once | INTRAVENOUS | Status: AC
  Administered 2017-08-04: 1000 mL via INTRAVENOUS

## 2017-08-04 MED ORDER — POTASSIUM CHLORIDE CRYS ER 20 MEQ PO TBCR
20.0000 meq | EXTENDED_RELEASE_TABLET | Freq: Once | ORAL | Status: AC
  Administered 2017-08-04: 20 meq via ORAL
  Filled 2017-08-04: qty 1

## 2017-08-04 MED ORDER — PANTOPRAZOLE SODIUM 40 MG IV SOLR
40.0000 mg | INTRAVENOUS | Status: DC
  Administered 2017-08-04 – 2017-08-08 (×5): 40 mg via INTRAVENOUS
  Filled 2017-08-04 (×5): qty 40

## 2017-08-04 MED ORDER — SODIUM CHLORIDE 0.9 % IV SOLN: Freq: Once | INTRAVENOUS | Status: DC

## 2017-08-04 NOTE — ED Notes (Signed)
Stretcher locked and in low position,Side rails upright. Call light given and within reach.  Comfort measures provided. Family at bedside.

## 2017-08-04 NOTE — ED Notes (Signed)
ED TO INPATIENT HANDOFF REPORT  Name/Age/Gender Sarah Perkins 84 y.o. female  Code Status    Code Status Orders  (From admission, onward)        Start     Ordered   08/03/17 1940  Do not attempt resuscitation (DNR)  Continuous    Question Answer Comment  In the event of cardiac or respiratory ARREST Do not call a "code blue"   In the event of cardiac or respiratory ARREST Do not perform Intubation, CPR, defibrillation or ACLS   In the event of cardiac or respiratory ARREST Use medication by any route, position, wound care, and other measures to relive pain and suffering. May use oxygen, suction and manual treatment of airway obstruction as needed for comfort.      08/03/17 1941    Code Status History    Date Active Date Inactive Code Status Order ID Comments User Context   12/03/2016 1636 12/08/2016 1845 DNR [id number]  Vashti Hey, MD Inpatient   12/03/2016 1425 12/03/2016 1636 DNR [id number]  Vashti Hey, MD ED   03/28/2016 0943 03/31/2016 1834 DNR [id number]  Domenic Polite, MD Inpatient   03/27/2016 1518 03/28/2016 0943 Full Code [id number]  Elwin Mocha, MD ED   10/21/2015 1938 10/26/2015 1715 DNR [id number]  Elgergawy, Silver Huguenin, MD Inpatient   06/02/2015 0049 06/08/2015 1714 DNR [id number]  Ivor Costa, MD ED   03/22/2015 1829 03/26/2015 1936 DNR [id number]  Samella Parr, NP Inpatient   10/08/2014 1523 10/11/2014 1741 Full Code [id number]  Samella Parr, NP Inpatient   08/21/2014 2001 08/24/2014 2121 Full Code [id number]  Etta Quill, DO ED   02/14/2014 1706 02/19/2014 1548 Partial Code [id number]  Annita Brod, MD Inpatient   02/14/2014 0908 02/14/2014 1706 Partial Code [id number]  Renne Musca, MD ED   01/28/2013 0348 02/01/2013 1838 Full Code [id number]  Phillips Grout, MD Inpatient   04/19/2012 0156 04/28/2012 2017 Full Code [id number]  Pollie Friar, RN Inpatient   09/21/2011 1411 09/25/2011 1704 DNR [id number]  Jonetta Osgood, MD Inpatient    05/15/2011 1144 05/21/2011 1845 DNR [id number]  Garth Bigness, RN Inpatient    Advance Directive Documentation     Most Recent Value  Type of Advance Directive  Healthcare Power of Attorney  Pre-existing out of facility DNR order (yellow form or pink MOST form)  -  "MOST" Form in Place?  -      Home/SNF/Other Home  Chief Complaint GI Bleed  Level of Care/Admitting Diagnosis ED Disposition    ED Disposition Condition [place]: [facility] [[id number]]  Level of Care: Stepdown [14]  Admit to SDU based on following criteria: Hemodynamic compromise or significant risk of instability:  Patient requiring short term acute titration and management of vasoactive drips, and invasive monitoring (i.e., CVP and Arterial line).  Diagnosis: Acute lower GI bleeding [856314]  Admitting Physician: Etta Quill [[id number]]  Attending Physician: Etta Quill [[id number]]  Estimated length of stay: past midnight tomorrow  Certification:: I certify this patient will need inpatient services for at least 2 midnights  PT Class (Do Not Modify): Inpatient [101]  PT Acc Code (Do Not Modify): Private [1]       Medical History Past Medical History:  Diagnosis Date  . Acid reflux    occasional  . Acute blood loss anemia 06/2009   received ~ 6 units blood  . Adenomatous polyp of colon 06/2009  .  Aneurysm ([place]) 1972   cerebral  . CHF (congestive heart failure) ([place])   . Diverticulitis   . GI bleed 02/14/2014  . History of GI diverticular bleed 06/2009   colonoscopy with endo clipping of bleeding tic by Dr Clarene Essex.  Flex sig by Dr Paulita Fujita.  . Hypertension   . Tremor    worse on right arm  . UTI (lower urinary tract infection)     Allergies No Known Allergies  IV Location/Drains/Wounds Patient Lines/Drains/Airways Status   Active Line/Drains/Airways    Name:   Placement date:   Placement time:   Site:   Days:   Peripheral IV 08/03/17 Right Forearm    08/03/17    2200    Forearm   1   External Urinary Catheter   12/06/16    0439    -   241   Incision (Closed) 10/22/15 Thigh Right   10/22/15    1245     652   Pressure Ulcer 03/22/15 Stage I -  Intact skin with non-blanchable redness of a localized area usually over a bony prominence.   03/22/15    1841     866   Pressure Injury 03/29/16 Stage II -  Partial thickness loss of dermis presenting as a shallow open ulcer with a red, pink wound bed without slough.   03/29/16    1016     493   Pressure Injury 12/03/16 Deep Tissue Injury - Purple or maroon localized area of discolored intact skin or blood-filled blister due to damage of underlying soft tissue from pressure and/or shear. maroon/purple area to left buttock   12/03/16    1630     244   Pressure Injury 12/03/16 Stage II -  Partial thickness loss of dermis presenting as a shallow open ulcer with a red, pink wound bed without slough. nonblanching pressure ulcer with partial skin thickness   12/03/16    1630     244          Labs/Imaging Results for orders placed or performed during the hospital encounter of 08/03/17 (from the past 48 hour(s))  POC occult blood, ED Provider will collect     Status: Abnormal   Collection Time: 08/03/17  5:14 PM  Result Value Ref Range   Fecal Occult Bld POSITIVE (A) NEGATIVE  CBC     Status: Abnormal   Collection Time: 08/03/17  5:49 PM  Result Value Ref Range   WBC 8.1 4.0 - 10.5 K/uL   RBC 3.31 (L) 3.87 - 5.11 MIL/uL   Hemoglobin 10.4 (L) 12.0 - 15.0 g/dL   HCT 32.3 (L) 36.0 - 46.0 %   MCV 97.6 78.0 - 100.0 fL   MCH 31.4 26.0 - 34.0 pg   MCHC 32.2 30.0 - 36.0 g/dL   RDW 13.8 11.5 - 15.5 %   Platelets 261 150 - 400 K/uL    Comment: Performed at [facility], [place] [address], [place], [place] [address]  Basic metabolic panel     Status: Abnormal   Collection Time: 08/03/17  5:49 PM  Result Value Ref Range   Sodium 144 135 - 145 mmol/L   Potassium 4.0 3.5 - 5.1 mmol/L    Chloride 108 101 - 111 mmol/L   CO2 28 22 - 32 mmol/L   Glucose, Bld 115 (H) 65 - 99 mg/dL   BUN 20 6 - 20 mg/dL   Creatinine, Ser 0.78 0.44 - 1.00 mg/dL   Calcium 8.8 (  L) 8.9 - 10.3 mg/dL   GFR calc non Af Amer >60 >60 mL/min   GFR calc Af Amer >60 >60 mL/min    Comment: (NOTE) The eGFR has been calculated using the CKD EPI equation. This calculation has not been validated in all clinical situations. eGFR's persistently <60 mL/min signify possible Chronic Kidney Disease.    Anion gap 8 5 - 15    Comment: Performed at [facility], [place] [address], [place], [place] [address]  Type and screen     Status: None (Preliminary result)   Collection Time: 08/03/17  5:49 PM  Result Value Ref Range   ABO/RH(D) B POS    Antibody Screen NEG    Sample Expiration 08/06/2017    Unit Number [id number]    Blood Component Type RED CELLS,LR    Unit division 00    Status of Unit ISSUED    Transfusion Status OK TO TRANSFUSE    Crossmatch Result COMPATIBLE    Donor AG Type NEGATIVE FOR E ANTIGEN    Unit Number [id number]    Blood Component Type RED CELLS,LR    Unit division 00    Status of Unit ISSUED,FINAL    Transfusion Status OK TO TRANSFUSE    Crossmatch Result COMPATIBLE    Donor AG Type NEGATIVE FOR E ANTIGEN    Unit Number [id number]    Blood Component Type RED CELLS,LR    Unit division 00    Status of Unit ALLOCATED    Transfusion Status OK TO TRANSFUSE    Crossmatch Result COMPATIBLE    Donor AG Type NEGATIVE FOR E ANTIGEN   Prepare RBC     Status: None   Collection Time: 08/03/17  8:00 PM  Result Value Ref Range   Order Confirmation      ORDER PROCESSED BY BLOOD BANK Performed at [place] [address], [place], [place] [address]   Prepare RBC     Status: None   Collection Time: 08/03/17  8:27 PM  Result Value Ref Range   Order Confirmation      ORDER PROCESSED BY BLOOD BANK Performed at [facility],  [place] [address] [place], [place] [address]   CBC     Status: Abnormal   Collection Time: 08/04/17  5:21 AM  Result Value Ref Range   WBC 8.6 4.0 - 10.5 K/uL   RBC 3.43 (L) 3.87 - 5.11 MIL/uL   Hemoglobin 10.7 (L) 12.0 - 15.0 g/dL   HCT 32.8 (L) 36.0 - 46.0 %   MCV 95.6 78.0 - 100.0 fL   MCH 31.2 26.0 - 34.0 pg   MCHC 32.6 30.0 - 36.0 g/dL   RDW 14.7 11.5 - 15.5 %   Platelets 162 150 - 400 K/uL    Comment: Performed at [facility], [place] [address] [place], [place] [address]  Basic metabolic panel     Status: Abnormal   Collection Time: 08/04/17  5:21 AM  Result Value Ref Range   Sodium 143 135 - 145 mmol/L   Potassium 6.1 (H) 3.5 - 5.1 mmol/L    Comment: DELTA CHECK NOTED REPEATED TO VERIFY NO VISIBLE HEMOLYSIS    Chloride 111 101 - 111 mmol/L   CO2 24 22 - 32 mmol/L   Glucose, Bld 192 (H) 65 - 99 mg/dL   BUN 18 6 - 20 mg/dL   Creatinine, Ser 0.78 0.44 - 1.00 mg/dL   Calcium 7.5 (L) 8.9 - 10.3 mg/dL  GFR calc non Af Amer >60 >60 mL/min   GFR calc Af Amer >60 >60 mL/min    Comment: (NOTE) The eGFR has been calculated using the CKD EPI equation. This calculation has not been validated in all clinical situations. eGFR's persistently <60 mL/min signify possible Chronic Kidney Disease.    Anion gap 8 5 - 15    Comment: Performed at [facility], [place] [address], [place], [place] [address]  Potassium     Status: Abnormal   Collection Time: 08/04/17 12:54 PM  Result Value Ref Range   Potassium 3.2 (L) 3.5 - 5.1 mmol/L    Comment: DELTA CHECK NOTED REPEATED TO VERIFY Performed at [place] [address], [place], [place] [address]   Hemoglobin and hematocrit, blood     Status: Abnormal   Collection Time: 08/04/17  1:43 PM  Result Value Ref Range   Hemoglobin 8.5 (L) 12.0 - 15.0 g/dL    Comment: DELTA CHECK NOTED REPEATED TO VERIFY    HCT 25.6 (L) 36.0 - 46.0 %    Comment: Performed at [facility], [place] [address], [place], [place] [address]   No results found.  Pending Labs Unresulted Labs (From admission, onward)   Start     Ordered   08/05/17 0500  CBC  Tomorrow morning,   R     08/04/17 1348   08/05/17 6962  Basic metabolic panel  Tomorrow morning,   R     08/04/17 1348   08/04/17 1800  Hemoglobin and hematocrit, blood  Once,   R     08/04/17 1342   08/04/17 1621  Prepare RBC  (Adult Blood Administration - Red Blood Cells)  Once,   R    Question Answer Comment  # of Units 2 units   Transfusion Indications Actively Bleeding / GI Bleed   If emergent release call blood bank [facility] [phone]      08/04/17 1620   08/04/17 1248  Urinalysis, Routine w reflex microscopic  Once,   R     08/04/17 1247      Vitals/Pain Today's Vitals   08/04/17 1500 08/04/17 1559 08/04/17 1600 08/04/17 1630  BP: (!) 100/51 (!) 88/49 (!) 78/50 (!) 96/51  Pulse: 86 96 (!) 105 73  Resp: '15 18 19 16  ' Temp:      TempSrc:      SpO2: 95% 94% 97% 96%  Weight:      Height:        Isolation Precautions No active isolations  Medications Medications  0.9 %  sodium chloride infusion ( Intravenous Not Given 08/03/17 2208)  0.9 %  sodium chloride infusion (10 mL/hr Intravenous Not Given 08/03/17 2208)  acetaminophen (TYLENOL) tablet 650 mg (has no administration in time range)    Or  acetaminophen (TYLENOL) suppository 650 mg (has no administration in time range)  ondansetron (ZOFRAN) tablet 4 mg (has no administration in time range)    Or  ondansetron (ZOFRAN) injection 4 mg (has no administration in time range)  0.9 %  sodium chloride infusion ( Intravenous Stopped 08/04/17 1655)  0.9 %  sodium chloride infusion (10 mL/hr Intravenous Not Given 08/03/17 2209)  pantoprazole (PROTONIX) injection 40 mg (40 mg Intravenous Given 08/04/17 1612)  sodium chloride 0.9 % bolus 1,000 mL (has no administration in time range)  0.9 %  sodium chloride infusion (has no administration in time range)   potassium chloride SA (K-DUR,KLOR-CON) CR tablet 20 mEq (  20 mEq Oral Given 08/04/17 1612)  sodium chloride 0.9 % bolus 1,000 mL (0 mLs Intravenous Stopped 08/04/17 1720)    Mobility non-ambulatory

## 2017-08-04 NOTE — Progress Notes (Addendum)
PROGRESS NOTE    Sarah Perkins  PJA:[id number] DOB: 06-20-1917 DOA: 08/03/2017 PCP: Thressa Sheller, MD    Brief Narrative: Sarah Perkins is a 84 y.o. female with medical history significant of mild dementia, HTN, recurrent diverticular bleeds in past, last was Aug 2018.  Patient treated with transfusions and supportive care at that time.  Patient not felt to be candidate for further colonoscopies and certianly not surgery. Presents to ED after   multiple episodes of BRBPR, large amount of blood loss, had episode in ED. She also had an episode of hypotension responded to fluid resuscitation.       Assessment & Plan:   Principal Problem:   Hemorrhagic shock ([place]) Active Problems:   Palliative care encounter   Acute lower GI bleeding   Hemorrhagic shock:  Responded to fluid resuscitation. Keep MAP >65 mmhg.  Continue with maintenance fluids.    Rectal bleeding secondary to diverticulosis: appears to have stopped.  Repeat H&H this am shows stable hemoglobin at 10.  Continue to monitor H&H to keep it greater than 7.  Clear liquid diet.  protonix   Hypotension: resolved.    GERD:  ON PPI.    Dementia:  Stable, no agitation.    Hypokalemia; Replaced.      DVT prophylaxis:  scd's Code Status: DNR Family Communication: none at bedside.  Disposition Plan: pending resolution of the rectal bleed.    Consultants:   None.    Procedures: none.    Antimicrobials: none.    Subjective: Would like to eat something.   Objective: Vitals:   08/04/17 1200 08/04/17 1230 08/04/17 1300 08/04/17 1330  BP: 121/85 (!) 121/50 (!) 113/49 (!) 104/49  Pulse: 69 74 75 75  Resp: 15 20 17 18   Temp:      TempSrc:      SpO2: 92% 92% 94% 93%  Weight:      Height:        Intake/Output Summary (Last 24 hours) at 08/04/2017 1338 Last data filed at 08/04/2017 0700 Gross per 24 hour  Intake 930 ml  Output -  Net 930 ml   Filed Weights   08/03/17 2210    Weight: 52.2 kg (115 lb)    Examination:  General exam: Appears calm and comfortable   Respiratory system: Clear to auscultation. Respiratory effort normal. Cardiovascular system: S1 & S2 heard, RRR. No JVD, murmurs, No pedal edema. Gastrointestinal system: Abdomen is nondistended, soft and nontender. No organomegaly or masses felt. Normal bowel sounds heard. Central nervous system: Alert and oriented to person and place.  Extremities: Symmetric 5 x 5 power. Skin: No rashes, lesions or ulcers Psychiatry:  Mood & affect appropriate.     Data Reviewed: I have personally reviewed following labs and imaging studies  CBC: Recent Labs  Lab 08/03/17 1749 08/04/17 0521  WBC 8.1 8.6  HGB 10.4* 10.7*  HCT 32.3* 32.8*  MCV 97.6 95.6  PLT 261 341   Basic Metabolic Panel: Recent Labs  Lab 08/03/17 1749 08/04/17 0521 08/04/17 1254  NA 144 143  --   K 4.0 6.1* 3.2*  CL 108 111  --   CO2 28 24  --   GLUCOSE 115* 192*  --   BUN 20 18  --   CREATININE 0.78 0.78  --   CALCIUM 8.8* 7.5*  --    GFR: Estimated Creatinine Clearance: 33.1 mL/min (by C-G formula based on SCr of 0.78 mg/dL). Liver Function Tests: No results for input(s): AST, ALT,  ALKPHOS, BILITOT, PROT, ALBUMIN in the last 168 hours. No results for input(s): LIPASE, AMYLASE in the last 168 hours. No results for input(s): AMMONIA in the last 168 hours. Coagulation Profile: No results for input(s): INR, PROTIME in the last 168 hours. Cardiac Enzymes: No results for input(s): CKTOTAL, CKMB, CKMBINDEX, TROPONINI in the last 168 hours. BNP (last 3 results) No results for input(s): PROBNP in the last 8760 hours. HbA1C: No results for input(s): HGBA1C in the last 72 hours. CBG: No results for input(s): GLUCAP in the last 168 hours. Lipid Profile: No results for input(s): CHOL, HDL, LDLCALC, TRIG, CHOLHDL, LDLDIRECT in the last 72 hours. Thyroid Function Tests: No results for input(s): TSH, T4TOTAL, FREET4, T3FREE,  THYROIDAB in the last 72 hours. Anemia Panel: No results for input(s): VITAMINB12, FOLATE, FERRITIN, TIBC, IRON, RETICCTPCT in the last 72 hours. Sepsis Labs: No results for input(s): PROCALCITON, LATICACIDVEN in the last 168 hours.  No results found for this or any previous visit (from the past 240 hour(s)).       Radiology Studies: No results found.      Scheduled Meds: . potassium chloride  20 mEq Oral Once   Continuous Infusions: . sodium chloride    . sodium chloride    . sodium chloride 75 mL/hr at 08/03/17 2208  . sodium chloride       LOS: 1 day    Time spent: 34 minutes.     Hosie Poisson, MD [facility] Pager [phone] If 7PM-7AM, please contact night-coverage [email or web address] Password [email or web address] 08/04/2017, 1:38 PM

## 2017-08-04 NOTE — ED Notes (Signed)
Pt cleaned by Techs and noted considerable amount of clots and blood in brief and bed. Assessment indicates active moderate bleeding currently

## 2017-08-04 NOTE — ED Notes (Signed)
Unable to obtain urine at this time after cleaning significant amount of blood off of pt. Pt became aggressive during cleaning. RN aware.

## 2017-08-04 NOTE — ED Notes (Signed)
Informed Dr Karleen Hampshire of BP, and said she would order NS bolus.

## 2017-08-04 NOTE — ED Notes (Signed)
Contacted and informed Dr. Karleen Hampshire of visual assessment

## 2017-08-05 DIAGNOSIS — Z7189 Other specified counseling: Secondary | ICD-10-CM

## 2017-08-05 LAB — CBC
HEMATOCRIT: 26.9 % — AB (ref 36.0–46.0)
HEMOGLOBIN: 8.9 g/dL — AB (ref 12.0–15.0)
MCH: 30 pg (ref 26.0–34.0)
MCHC: 33.1 g/dL (ref 30.0–36.0)
MCV: 90.6 fL (ref 78.0–100.0)
Platelets: 140 10*3/uL — ABNORMAL LOW (ref 150–400)
RBC: 2.97 MIL/uL — AB (ref 3.87–5.11)
RDW: 16.1 % — ABNORMAL HIGH (ref 11.5–15.5)
WBC: 14.6 10*3/uL — AB (ref 4.0–10.5)

## 2017-08-05 LAB — BASIC METABOLIC PANEL
ANION GAP: 7 (ref 5–15)
BUN: 25 mg/dL — ABNORMAL HIGH (ref 6–20)
CHLORIDE: 116 mmol/L — AB (ref 101–111)
CO2: 22 mmol/L (ref 22–32)
CREATININE: 0.8 mg/dL (ref 0.44–1.00)
Calcium: 7.7 mg/dL — ABNORMAL LOW (ref 8.9–10.3)
GFR calc non Af Amer: 60 mL/min — ABNORMAL LOW (ref >60)
Glucose, Bld: 104 mg/dL — ABNORMAL HIGH (ref 65–99)
POTASSIUM: 4.2 mmol/L (ref 3.5–5.1)
SODIUM: 145 mmol/L (ref 135–145)

## 2017-08-05 LAB — HEMOGLOBIN AND HEMATOCRIT, BLOOD
HEMATOCRIT: 24.7 % — AB (ref 36.0–46.0)
HEMOGLOBIN: 8.4 g/dL — AB (ref 12.0–15.0)

## 2017-08-05 NOTE — Progress Notes (Signed)
PROGRESS NOTE    Sarah Perkins  RJJ:[id number] DOB: [birth year] DOA: 08/03/2017 PCP: Thressa Sheller, MD    Brief Narrative: Sarah Perkins is a 84 y.o. female with medical history significant of mild dementia, HTN, recurrent diverticular bleeds in past, last was Aug 2018.  Patient treated with transfusions and supportive care at that time.  Patient not felt to be candidate for further colonoscopies and certianly not surgery. Presents to ED after   multiple episodes of BRBPR, large amount of blood loss, had episode in ED. She also had an episode of hypotension responded to fluid resuscitation.       Assessment & Plan:   Principal Problem:   Hemorrhagic shock ([place]) Active Problems:   Palliative care encounter   Acute lower GI bleeding   Hemorrhagic shock:  Responded to fluid resuscitation. Keep MAP >65 mmhg.  Continue with maintenance fluids.    Rectal bleeding secondary to diverticulosis: appears to have stopped.  Repeat H&H this am drop in hemoglobin from 12 at baseline to around 8.9. Hemoglobin stabilized.   Continue to monitor H&H to keep it greater than 7.  Clear liquid diet.  protonix   Hypotension: resolved.   Hypocalcemia:  Will start her on calcium supplements on discharge or once she is able to take po.   Mild thrombocytopenia:  Platelet count at 140,000.   GERD:  ON PPI.    Dementia:  Stable, no agitation.    Hypokalemia; Replaced.      DVT prophylaxis:  scd's Code Status: DNR Family Communication: discussed with son outside the ICU.   Disposition Plan: pending resolution of the rectal bleed.    Consultants:  None.   Procedures: none.    Antimicrobials: none.    Subjective: No new complaints. Wants to drink juice.   Objective: Vitals:   08/05/17 0751 08/05/17 0800 08/05/17 0900 08/05/17 1000  BP:  (!) 147/44 (!) 136/43 (!) 122/42  Pulse:  94 98 94  Resp:  18 (!) 23 (!) 21  Temp: 98.1 F (36.7 C)     TempSrc:  Oral     SpO2:  90% 92% 93%  Weight:      Height:        Intake/Output Summary (Last 24 hours) at 08/05/2017 1112 Last data filed at 08/05/2017 0726 Gross per 24 hour  Intake 2866.97 ml  Output -  Net 2866.97 ml   Filed Weights   08/03/17 2210  Weight: 52.2 kg (115 lb)    Examination:  General exam: Appears calm and comfortable, not in distress.  Respiratory system: clear, no wheezing or rhonchi.  Cardiovascular system: S1 & S2 heard, RRR. No JVD, murmurs, No pedal edema. Gastrointestinal system: abd is soft non tender non distended bowel souds heard.  Central nervous system: Alert and oriented to person and place.  Extremities: no cyanosis or clubbing.  Skin: No rashes, lesions or ulcers Psychiatry:  Mood & affect appropriate.     Data Reviewed: I have personally reviewed following labs and imaging studies  CBC: Recent Labs  Lab 08/03/17 1749 08/04/17 0521 08/04/17 1343 08/04/17 1855 08/05/17 0146  WBC 8.1 8.6  --   --  14.6*  HGB 10.4* 10.7* 8.5* 8.7* 8.9*  HCT 32.3* 32.8* 25.6* 26.4* 26.9*  MCV 97.6 95.6  --   --  90.6  PLT 261 162  --   --  016*   Basic Metabolic Panel: Recent Labs  Lab 08/03/17 1749 08/04/17 0521 08/04/17 1254 08/05/17 0146  NA 144  143  --  145  K 4.0 6.1* 3.2* 4.2  CL 108 111  --  116*  CO2 28 24  --  22  GLUCOSE 115* 192*  --  104*  BUN 20 18  --  25*  CREATININE 0.78 0.78  --  0.80  CALCIUM 8.8* 7.5*  --  7.7*   GFR: Estimated Creatinine Clearance: 33.1 mL/min (by C-G formula based on SCr of 0.8 mg/dL). Liver Function Tests: No results for input(s): AST, ALT, ALKPHOS, BILITOT, PROT, ALBUMIN in the last 168 hours. No results for input(s): LIPASE, AMYLASE in the last 168 hours. No results for input(s): AMMONIA in the last 168 hours. Coagulation Profile: No results for input(s): INR, PROTIME in the last 168 hours. Cardiac Enzymes: No results for input(s): CKTOTAL, CKMB, CKMBINDEX, TROPONINI in the last 168 hours. BNP (last 3  results) No results for input(s): PROBNP in the last 8760 hours. HbA1C: No results for input(s): HGBA1C in the last 72 hours. CBG: No results for input(s): GLUCAP in the last 168 hours. Lipid Profile: No results for input(s): CHOL, HDL, LDLCALC, TRIG, CHOLHDL, LDLDIRECT in the last 72 hours. Thyroid Function Tests: No results for input(s): TSH, T4TOTAL, FREET4, T3FREE, THYROIDAB in the last 72 hours. Anemia Panel: No results for input(s): VITAMINB12, FOLATE, FERRITIN, TIBC, IRON, RETICCTPCT in the last 72 hours. Sepsis Labs: No results for input(s): PROCALCITON, LATICACIDVEN in the last 168 hours.  No results found for this or any previous visit (from the past 240 hour(s)).       Radiology Studies: No results found.      Scheduled Meds: . pantoprazole (PROTONIX) IV  40 mg Intravenous Q24H   Continuous Infusions: . sodium chloride    . sodium chloride 75 mL/hr at 08/05/17 0726     LOS: 2 days    Time spent: 34 minutes.     Hosie Poisson, MD [facility] Pager [phone] If 7PM-7AM, please contact night-coverage [email or web address] Password [email or web address] 08/05/2017, 11:12 AM

## 2017-08-06 LAB — URINALYSIS, ROUTINE W REFLEX MICROSCOPIC
Bilirubin Urine: NEGATIVE
GLUCOSE, UA: NEGATIVE mg/dL
Hgb urine dipstick: NEGATIVE
KETONES UR: NEGATIVE mg/dL
LEUKOCYTES UA: NEGATIVE
NITRITE: NEGATIVE
PROTEIN: NEGATIVE mg/dL
Specific Gravity, Urine: 1.02 (ref 1.005–1.030)
pH: 5 (ref 5.0–8.0)

## 2017-08-06 LAB — HEMOGLOBIN AND HEMATOCRIT, BLOOD
HCT: 22.4 % — ABNORMAL LOW (ref 36.0–46.0)
HEMATOCRIT: 38.6 % (ref 36.0–46.0)
HEMOGLOBIN: 7.6 g/dL — AB (ref 12.0–15.0)
HEMOGLOBIN: 12.9 g/dL (ref 12.0–15.0)

## 2017-08-06 LAB — PREPARE RBC (CROSSMATCH)

## 2017-08-06 LAB — MRSA PCR SCREENING: MRSA by PCR: NEGATIVE

## 2017-08-06 MED ORDER — GERHARDT'S BUTT CREAM
TOPICAL_CREAM | Freq: Two times a day (BID) | CUTANEOUS | Status: DC
  Administered 2017-08-06: 16:00:00 via TOPICAL
  Administered 2017-08-07: 1 via TOPICAL
  Administered 2017-08-07 – 2017-08-09 (×2): via TOPICAL
  Filled 2017-08-06 (×2): qty 1

## 2017-08-06 MED ORDER — SODIUM CHLORIDE 0.9 % IV SOLN
Freq: Once | INTRAVENOUS | Status: AC
  Administered 2017-08-06: 11:00:00 via INTRAVENOUS

## 2017-08-06 MED ORDER — FUROSEMIDE 20 MG PO TABS
20.0000 mg | ORAL_TABLET | Freq: Every day | ORAL | Status: DC
  Administered 2017-08-06 – 2017-08-09 (×4): 20 mg via ORAL
  Filled 2017-08-06 (×3): qty 1

## 2017-08-06 MED ORDER — AMLODIPINE BESYLATE 5 MG PO TABS
5.0000 mg | ORAL_TABLET | Freq: Every day | ORAL | Status: DC
  Administered 2017-08-06 – 2017-08-09 (×4): 5 mg via ORAL
  Filled 2017-08-06 (×3): qty 1

## 2017-08-06 NOTE — Consult Note (Signed)
Consultation Note Date: 08/06/2017   Patient Name: Sarah Perkins  DOB: 10-21-1917  MRN: [id number]  Age / Sex: 84 y.o., female  PCP: Thressa Sheller, MD Referring Physician: Hosie Poisson, MD  Reason for Consultation: Establishing goals of care  HPI/Patient Profile: 84 y.o. female  with past medical history of dementia, recurrent diverticular bleeds admitted on 08/03/2017 with BRBPR.  She was last admitted in Aug 2018 and deemed not a candidate for further colonoscopies or surgical intervention at that time.  Palliative consulted for [place].   Clinical Assessment and Goals of Care: Chart reviewed and I met today with [name].  She was lying in bed in no acute distress and reports that she is feeling better today.  She denies any acute complaints.  She appears to have limited insight into her medical conditions.  She reports that I should probably speak with her son.  I called and was able to reach her son, Sarah Perkins, via phone.  He reports that the most important thing to him and his mother has been to be together and keep her at home for as long as possible.  He relays hardships related to this but reports that his goal is to be as close as possible to her until the time comes that she dies as he believes this to be her wish as well.  We reviewed her clinical course of this hospitalization as well as over the past several years with recurrent GI bleeds.  We also discussed continued decline in nutrition, cognition, and functional status in chronic progressive illness (dementia).  Overall, it appears that Sarah Perkins understands that his mother has a non-fixable condition in her recurrent diverticular bleeds and dementia, however, at the same time it appears that he also has limited insight into her overall medical condition.  We discussed again the goal of helping his mother live as well as possible for as  long as possible.  He is agreeable to sitting down and meeting in person tomorrow.  I let him know that I think that his mother may benefit from further discussion about long-term goals and completion of a MOST form.  NEXT OF KIN: Son, Sarah Perkins   SUMMARY OF RECOMMENDATIONS   -Patient's son is clear that his desires to continue with supportive care including further transfusions if needed.  He understands she is not a candidate for further invasive procedures such as colonoscopy or surgical intervention. -He is agreeable to meeting in person to continue conversation about long-term goals of care.  He is limited in his transportation due to the fact that the car broke down.  He will hopefully be back again to the hospital tomorrow.  I will call him in the morning and try and set up a meeting with him. -We discussed completion of most form prior to discharge.  I think this will be beneficial for [name] moving forward.  Code Status/Advance Care Planning:  DNR   Symptom Management:   Denies complaints  Palliative Prophylaxis:   Frequent Pain Assessment  Psycho-social/Spiritual:   Desire for further Chaplaincy support: Did not address today  Additional Recommendations: Caregiving  Support/Resources  Prognosis:   Unable to determine  Discharge Planning: To Be Determined      Primary Diagnoses: Present on Admission: . Acute lower GI bleeding . Hemorrhagic shock ([place]) . Rectal bleeding . Malnutrition of moderate degree . GERD (gastroesophageal reflux disease) . FTT (failure to thrive) in adult . Essential hypertension . Dementia   I have reviewed the medical record, interviewed the patient and family, and examined the patient. The following aspects are pertinent.  Past Medical History:  Diagnosis Date  . Acid reflux    occasional  . Acute blood loss anemia 06/2009   received ~ 6 units blood  . Adenomatous polyp of colon 06/2009  . Aneurysm ([place]) 1972   cerebral    . CHF (congestive heart failure) ([place])   . Diverticulitis   . GI bleed 02/14/2014  . History of GI diverticular bleed 06/2009   colonoscopy with endo clipping of bleeding tic by Dr Clarene Essex.  Flex sig by Dr Paulita Fujita.  . Hypertension   . Tremor    worse on right arm  . UTI (lower urinary tract infection)    Social History   Socioeconomic History  . Marital status: Divorced    Spouse name: Not on file  . Number of children: Not on file  . Years of education: Not on file  . Highest education level: Not on file  Occupational History  . Not on file  Social Needs  . Financial resource strain: Not on file  . Food insecurity:    Worry: Not on file    Inability: Not on file  . Transportation needs:    Medical: Not on file    Non-medical: Not on file  Tobacco Use  . Smoking status: Never Smoker  . Smokeless tobacco: Never Used  Substance and Sexual Activity  . Alcohol use: No  . Drug use: No  . Sexual activity: Never  Lifestyle  . Physical activity:    Days per week: Not on file    Minutes per session: Not on file  . Stress: Not on file  Relationships  . Social connections:    Talks on phone: Not on file    Gets together: Not on file    Attends religious service: Not on file    Active member of club or organization: Not on file    Attends meetings of clubs or organizations: Not on file    Relationship status: Not on file  Other Topics Concern  . Not on file  Social History Narrative  . Not on file   Family History  Problem Relation Age of Onset  . Stroke Mother   . Lung disease Father   . Stroke Brother    Scheduled Meds: . pantoprazole (PROTONIX) IV  40 mg Intravenous Q24H   Continuous Infusions: . sodium chloride    . sodium chloride 75 mL/hr at 08/06/17 0521  . sodium chloride     PRN Meds:.acetaminophen **OR** acetaminophen, ondansetron **OR** ondansetron (ZOFRAN) IV Medications Prior to Admission:  Prior to Admission medications   Medication Sig Start  Date End Date Taking? Authorizing Provider  acetaminophen (TYLENOL) 325 MG tablet Take 2 tablets (650 mg total) by mouth every 6 (six) hours as needed for mild pain or moderate pain (or Fever >/= 101). 10/26/15  Yes Domenic Polite, MD  amLODipine (NORVASC) 5 MG tablet Take 5 mg by mouth daily.  Yes [provider]  CALCIUM-MAGNESIUM-ZINC PO Take 1 capsule by mouth daily.   Yes [provider]  cyanocobalamin 100 MCG tablet Take 100 mcg by mouth daily.   Yes [provider]  docusate sodium (COLACE) 100 MG capsule Take 100 mg by mouth at bedtime.   Yes [provider]  ferrous sulfate 325 (65 FE) MG tablet Take 325 mg by mouth daily with breakfast.   Yes [provider]  furosemide (LASIX) 20 MG tablet Take 1 tablet (20 mg total) by mouth daily. 03/31/16  Yes Domenic Polite, MD  hydrocerin (EUCERIN) CREA Apply 1 application topically 2 (two) times daily. 12/08/16  Yes Eugenie Filler, MD  Multiple Vitamin (MULTIVITAMIN) LIQD Take 15 mLs by mouth daily. 12/09/16  Yes Eugenie Filler, MD  neomycin-bacitracin-polymyxin (NEOSPORIN) [phone] ointment Apply topically 4 (four) times daily as needed (sores).   Yes [provider]  pantoprazole (PROTONIX) 40 MG tablet Take 1 tablet (40 mg total) by mouth daily. 06/08/15  Yes Florencia Reasons, MD  polyethylene glycol [facility] / GLYCOLAX) packet Take 17 g by mouth every morning.   Yes [provider]  vitamin C (ASCORBIC ACID) 500 MG tablet Take 500 mg by mouth daily.   Yes [provider]  feeding supplement (BOOST / RESOURCE BREEZE) LIQD Take 1 Container by mouth 3 (three) times daily between meals. 12/08/16   Eugenie Filler, MD  potassium chloride SA (K-DUR,KLOR-CON) 20 MEQ tablet Take 2 tablets (40 mEq total) by mouth daily. Patient not taking: Reported on 08/03/2017 03/31/16   Domenic Polite, MD   No Known Allergies Review of Systems Denies complaints.  Review of systems limited by  history of dementia.  Physical Exam General: Alert, awake, in no acute distress.  HEENT: No bruits, no goiter, no JVD Heart: Regular rate and rhythm. No murmur appreciated. Lungs: Good air movement, clear Abdomen: Soft, nontender, nondistended, positive bowel sounds.  Ext: No significant edema Skin: Warm and dry Neuro: Grossly intact, nonfocal.  Vital Signs: BP (!) 116/39   Pulse 80   Temp 97.9 F (36.6 C) (Oral)   Resp 19   Ht '5\' 6"'  (1.676 m)   Wt 52.2 kg (115 lb)   SpO2 91%   BMI 18.56 kg/m  Pain Scale: 0-10   Pain Score: 0-No pain   SpO2: SpO2: 91 % O2 Device:SpO2: 91 % O2 Flow Rate: .   IO: Intake/output summary:   Intake/Output Summary (Last 24 hours) at 08/06/2017 0836 Last data filed at 08/06/2017 1941 Gross per 24 hour  Intake 2586.25 ml  Output -  Net 2586.25 ml    LBM: Last BM Date: 08/05/17 Baseline Weight: Weight: 52.2 kg (115 lb) Most recent weight: Weight: 52.2 kg (115 lb)     Palliative Assessment/Data:   Flowsheet Rows     Most Recent Value  Intake Tab  Referral Department  Hospitalist  Unit at Time of Referral  Intermediate Care Unit  Palliative Care Primary Diagnosis  Other (Comment) [GI]  Date Notified  08/04/17  Palliative Care Type  Return patient Palliative Care  Reason for referral  Clarify Goals of Care  Date of Admission  08/03/17  Date first seen by Palliative Care  08/05/17  # of days Palliative referral response time  1 Day(s)  # of days IP prior to Palliative referral  1  Clinical Assessment  Palliative Performance Scale Score  40%  Pain Max last 24 hours  5  Pain Min Last 24 hours  0  Psychosocial & Spiritual Assessment  Palliative Care Outcomes  Palliative Care Outcomes  Clarified goals of care      Time In: 0272 Time Out: 1805 Time Total: 85 Greater than 50%  of this time was spent counseling and coordinating care related to the above assessment and plan.  Signed by: Micheline Rough, MD   Please contact  Palliative Medicine Team phone at [phone] for questions and concerns.  For individual provider: See Shea Evans

## 2017-08-06 NOTE — Consult Note (Signed)
[place] Nurse wound consult note Reason for Consult:medical asdhesive related skin injury to sternal area, consistent with leads or tape.  Skin is paper thin and friable.  Moisture associated skin damage (MASD) to buttocks, POA Wound type:trauma and moisture Pressure Injury POA: NA Measurement: chest:  2 cm x 1.7 cm x 0.1 cm skin defect present. COnsistent with adhesive removal and stripping.  MASD to perineal skin, present on admission Wound GNF:AOZH and moist Drainage (amount, consistency, odor) minimal serosanguinous Periwound: thin fragile skin Dressing procedure/placement/frequency:Cleanse wound to chest with NS and pat dry.  Apply calcium alginate to wound bed.  Cover with silicone foam. Change every three days and PRN soilage.   Gerhardts barrier cream to buttocks and perineal skin twice daily.  Will not follow at this time.  Please re-consult if needed.  Domenic Moras RN BSN [place] Pager [phone]

## 2017-08-06 NOTE — Plan of Care (Signed)
  Problem: Nutrition: Goal: Adequate nutrition will be maintained Outcome: Progressing   Problem: Elimination: Goal: Will not experience complications related to bowel motility Outcome: Progressing   Problem: Pain Managment: Goal: General experience of comfort will improve Outcome: Progressing   

## 2017-08-06 NOTE — Progress Notes (Signed)
PROGRESS NOTE    [name]  KZS:[id number] DOB: [birth year] DOA: 08/03/2017 PCP: Thressa Sheller, MD    Brief Narrative: Sarah Perkins is a 84 y.o. female with medical history significant of mild dementia, HTN, recurrent diverticular bleeds in past, last was Aug 2018.  Patient treated with transfusions and supportive care at that time.  Patient not felt to be candidate for further colonoscopies and certianly not surgery. Presents to ED after   multiple episodes of BRBPR, large amount of blood loss, had episode in ED. She also had an episode of hypotension responded to fluid resuscitation.       Assessment & Plan:   Principal Problem:   Hemorrhagic shock ([place]) Active Problems:   GERD (gastroesophageal reflux disease)   Dementia   Rectal bleeding   Palliative care encounter   FTT (failure to thrive) in adult   Essential hypertension   Malnutrition of moderate degree   Acute lower GI bleeding   Hemorrhagic shock:  Resolved.  Responded to fluid resuscitation. Keep MAP >65 mmhg.  Continue with maintenance fluids.    Rectal bleeding secondary to diverticulosis: appears to have stopped.  Repeat H&H this am drop in hemoglobin from 12 at baseline to around 7 .  2 more units of prbc transfusion ordered. So a total of 6 units of prbc transfusion so far.  Continue to monitor H&H to keep it greater than 7.  Clear liquid diet. Until the hemoglobin stabilizes.  protonix IV.    Hypotension: resolved.   Hypocalcemia:  Will start her on calcium supplements on discharge or once she is able to take po.   Mild thrombocytopenia:  Platelet count at 140,000.   GERD:  ON PPI.    Dementia:  Stable, no agitation.    Hypokalemia; Replaced.   Acute blood loss anemia from diverticular bleed.  Transfuse to keep hemoglobin greater than 7.  In view of multiple admissions, requested palliative care consult for goals of care.    Chronic diastolic heart failure:  She  appears euvolemic.    DVT prophylaxis:  scd's Code Status: DNR Family Communication: discussed with son  At bedside.    Disposition Plan: pending resolution of the rectal bleed.    Consultants:  None.   Procedures: none.    Antimicrobials: none.    Subjective: She appears comfortable. Denies chest pain or sob.   Objective: Vitals:   08/05/17 2329 08/06/17 0200 08/06/17 0320 08/06/17 0600  BP:  (!) 119/36  (!) 116/39  Pulse:  79  80  Resp:  19  19  Temp: 99.2 F (37.3 C)  98.3 F (36.8 C)   TempSrc: Axillary  Oral   SpO2:  92%  91%  Weight:      Height:        Intake/Output Summary (Last 24 hours) at 08/06/2017 0809 Last data filed at 08/06/2017 7322 Gross per 24 hour  Intake 2586.25 ml  Output -  Net 2586.25 ml   Filed Weights   08/03/17 2210  Weight: 52.2 kg (115 lb)    Examination:  General exam: in good spirits, son at bedside.  Respiratory system: good air entry bilateral. No wheezing or rhonchi.   Cardiovascular system: S1 & S2 heard, RRR. No JVD, murmurs, No pedal edema. Gastrointestinal system: abd is soft NT ND BS+ Central nervous system: Alert and oriented to person and place. NON focal.  Extremities: no cyanosis or clubbing.  Skin: No rashes, lesions or ulcers Psychiatry:  Mood & affect appropriate.  Data Reviewed: I have personally reviewed following labs and imaging studies  CBC: Recent Labs  Lab 08/03/17 1749 08/04/17 0521 08/04/17 1343 08/04/17 1855 08/05/17 0146 08/05/17 1807 08/06/17 0638  WBC 8.1 8.6  --   --  14.6*  --   --   HGB 10.4* 10.7* 8.5* 8.7* 8.9* 8.4* 7.6*  HCT 32.3* 32.8* 25.6* 26.4* 26.9* 24.7* 22.4*  MCV 97.6 95.6  --   --  90.6  --   --   PLT 261 162  --   --  140*  --   --    Basic Metabolic Panel: Recent Labs  Lab 08/03/17 1749 08/04/17 0521 08/04/17 1254 08/05/17 0146  NA 144 143  --  145  K 4.0 6.1* 3.2* 4.2  CL 108 111  --  116*  CO2 28 24  --  22  GLUCOSE 115* 192*  --  104*  BUN 20 18   --  25*  CREATININE 0.78 0.78  --  0.80  CALCIUM 8.8* 7.5*  --  7.7*   GFR: Estimated Creatinine Clearance: 33.1 mL/min (by C-G formula based on SCr of 0.8 mg/dL). Liver Function Tests: No results for input(s): AST, ALT, ALKPHOS, BILITOT, PROT, ALBUMIN in the last 168 hours. No results for input(s): LIPASE, AMYLASE in the last 168 hours. No results for input(s): AMMONIA in the last 168 hours. Coagulation Profile: No results for input(s): INR, PROTIME in the last 168 hours. Cardiac Enzymes: No results for input(s): CKTOTAL, CKMB, CKMBINDEX, TROPONINI in the last 168 hours. BNP (last 3 results) No results for input(s): PROBNP in the last 8760 hours. HbA1C: No results for input(s): HGBA1C in the last 72 hours. CBG: No results for input(s): GLUCAP in the last 168 hours. Lipid Profile: No results for input(s): CHOL, HDL, LDLCALC, TRIG, CHOLHDL, LDLDIRECT in the last 72 hours. Thyroid Function Tests: No results for input(s): TSH, T4TOTAL, FREET4, T3FREE, THYROIDAB in the last 72 hours. Anemia Panel: No results for input(s): VITAMINB12, FOLATE, FERRITIN, TIBC, IRON, RETICCTPCT in the last 72 hours. Sepsis Labs: No results for input(s): PROCALCITON, LATICACIDVEN in the last 168 hours.  No results found for this or any previous visit (from the past 240 hour(s)).       Radiology Studies: No results found.      Scheduled Meds: . pantoprazole (PROTONIX) IV  40 mg Intravenous Q24H   Continuous Infusions: . sodium chloride    . sodium chloride 75 mL/hr at 08/06/17 0521  . sodium chloride       LOS: 3 days    Time spent: 34 minutes.     Hosie Poisson, MD [facility] Pager [phone] If 7PM-7AM, please contact night-coverage [email or web address] Password [email or web address] 08/06/2017, 8:09 AM

## 2017-08-06 NOTE — Progress Notes (Signed)
Palliative Care Progress Note  I met today with Sarah Perkins and her son.  She remains confused and cannot participate in conversation.   I met with son outside of the room. We reviewed a MOST form and discussed how to develop plan of care to focus on continuing therapies that would maximize chance of being well enough to return home and limiting therapies not in line with this goal.  Discussed regarding heroic interventions at the end-of-life. There is agreement this would not be in line with prior expressed wishes for a natural death or be likely to lead to getting well enough to go back home. She is DO NOT RESUSCITATE.  We completed MOST form today. DNR, Limited additional interventions, IVF and ABX if indicated, no feeding tube, agreeable to continued transfusions.  Her son understands severity of her condition but remains hopeful that she will continue to improve with continued supportive care.  Continue current therapies.   He would be interested in placement at skilled facility for rehab if she qualifies at end of hospitalization.  Palliative to continue to follow.  Discussed that we will need to have continued meetings based on her clinical course (likely in 24-48 hours if bleeding continues).  Total time: 40 minutes Greater than 50%  of this time was spent counseling and coordinating care related to the above assessment and plan.  Micheline Rough, MD [place] Team [phone]

## 2017-08-06 NOTE — Progress Notes (Signed)
08/06/17  1700  Paged MD in regards to BP elevated. Waiting for response.

## 2017-08-07 DIAGNOSIS — K219 Gastro-esophageal reflux disease without esophagitis: Secondary | ICD-10-CM

## 2017-08-07 DIAGNOSIS — F0390 Unspecified dementia without behavioral disturbance: Secondary | ICD-10-CM

## 2017-08-07 DIAGNOSIS — I10 Essential (primary) hypertension: Secondary | ICD-10-CM

## 2017-08-07 LAB — BASIC METABOLIC PANEL
Anion gap: 6 (ref 5–15)
BUN: 13 mg/dL (ref 6–20)
CHLORIDE: 113 mmol/L — AB (ref 101–111)
CO2: 24 mmol/L (ref 22–32)
Calcium: 8.1 mg/dL — ABNORMAL LOW (ref 8.9–10.3)
Creatinine, Ser: 0.62 mg/dL (ref 0.44–1.00)
GFR calc non Af Amer: >60 mL/min (ref >60)
Glucose, Bld: 106 mg/dL — ABNORMAL HIGH (ref 65–99)
POTASSIUM: 3.3 mmol/L — AB (ref 3.5–5.1)
SODIUM: 143 mmol/L (ref 135–145)

## 2017-08-07 LAB — TYPE AND SCREEN
ABO/RH(D): B POS
Antibody Screen: NEGATIVE
DONOR AG TYPE: NEGATIVE
DONOR AG TYPE: NEGATIVE
DONOR AG TYPE: NEGATIVE
DONOR AG TYPE: NEGATIVE
Donor AG Type: NEGATIVE
UNIT DIVISION: 0
UNIT DIVISION: 0
UNIT DIVISION: 0
Unit division: 0
Unit division: 0

## 2017-08-07 LAB — BPAM RBC
BLOOD PRODUCT EXPIRATION DATE: 201904152359
BLOOD PRODUCT EXPIRATION DATE: 201905072359
Blood Product Expiration Date: 201904262359
Blood Product Expiration Date: 201905032359
Blood Product Expiration Date: 201905132359
ISSUE DATE / TIME: 201904080242
ISSUE DATE / TIME: 201904082015
ISSUE DATE / TIME: 201904101005
ISSUE DATE / TIME: 201904101255
ISSUE DATE / TIME: 201904072152
UNIT TYPE AND RH: 1700
UNIT TYPE AND RH: 5100
UNIT TYPE AND RH: 5100
UNIT TYPE AND RH: 5100
Unit Type and Rh: 7300

## 2017-08-07 LAB — CBC
HEMATOCRIT: 32.8 % — AB (ref 36.0–46.0)
HEMOGLOBIN: 10.9 g/dL — AB (ref 12.0–15.0)
MCH: 30.1 pg (ref 26.0–34.0)
MCHC: 33.2 g/dL (ref 30.0–36.0)
MCV: 90.6 fL (ref 78.0–100.0)
Platelets: 157 10*3/uL (ref 150–400)
RBC: 3.62 MIL/uL — AB (ref 3.87–5.11)
RDW: 16.2 % — ABNORMAL HIGH (ref 11.5–15.5)
WBC: 15.8 10*3/uL — AB (ref 4.0–10.5)

## 2017-08-07 LAB — HEMOGLOBIN AND HEMATOCRIT, BLOOD
HEMATOCRIT: 34.6 % — AB (ref 36.0–46.0)
HEMOGLOBIN: 11.5 g/dL — AB (ref 12.0–15.0)

## 2017-08-07 MED ORDER — ADULT MULTIVITAMIN W/MINERALS CH
1.0000 | ORAL_TABLET | Freq: Every day | ORAL | Status: DC
  Administered 2017-08-08 – 2017-08-09 (×2): 1 via ORAL
  Filled 2017-08-07 (×2): qty 1

## 2017-08-07 MED ORDER — ENSURE ENLIVE PO LIQD
237.0000 mL | Freq: Two times a day (BID) | ORAL | Status: DC
  Administered 2017-08-08 – 2017-08-09 (×3): 237 mL via ORAL

## 2017-08-07 MED ORDER — POTASSIUM CHLORIDE CRYS ER 20 MEQ PO TBCR
40.0000 meq | EXTENDED_RELEASE_TABLET | Freq: Two times a day (BID) | ORAL | Status: AC
  Administered 2017-08-07 – 2017-08-08 (×2): 40 meq via ORAL
  Filled 2017-08-07 (×2): qty 2

## 2017-08-07 NOTE — Evaluation (Signed)
Physical Therapy Evaluation Patient Details Name: Sarah Perkins MRN: [id number] DOB: May 17, 1917 Today's Date: 08/07/2017   History of Present Illness  84 y.o. female with medical history significant of mild dementia, HTN, recurrent diverticular bleeds in past, last was Aug 2018.  Patient treated with transfusions and supportive care at that time.  Patient not felt to be candidate for further colonoscopies and certianly not surgery. Presents to ED after   multiple episodes of BRBPR, large amount of blood loss, had episode in ED. She also had an episode of hypotension responded to fluid resuscitation.   Clinical Impression  Pt admitted with above diagnosis. Pt currently with functional limitations due to the deficits listed below (see PT Problem List). Total assist for supine to sit then back to supine, heavy posterior lean requiring max assist with sitting on edge of bed. No family present to provide prior functional level, pt not able to provide this info 2* dementia.  Pt needs 24* assist.  Pt will benefit from skilled PT to increase their independence and safety with mobility to allow discharge to the venue listed below.       Follow Up Recommendations SNF;Supervision/Assistance - 24 hour    Equipment Recommendations  None recommended by PT    Recommendations for Other Services       Precautions / Restrictions Precautions Precautions: Fall Precaution Comments: pt not able to provide fall hx 2* dementia Restrictions Weight Bearing Restrictions: No      Mobility  Bed Mobility Overal bed mobility: Needs Assistance Bed Mobility: Supine to Sit;Sit to Supine     Supine to sit: Total assist;+2 for physical assistance Sit to supine: Total assist;+2 for physical assistance   General bed mobility comments: supine to sit with total assist (pt 10%), assist to raise trunk and advance BLEs; pt sat on edge of bed x 2 minutes with posterior lean  Transfers                  General transfer comment: pt refused to attempt  Ambulation/Gait                Stairs            Wheelchair Mobility    Modified Rankin (Stroke Patients Only)       Balance Overall balance assessment: Needs assistance Sitting-balance support: Bilateral upper extremity supported;Feet supported Sitting balance-Leahy Scale: Zero Sitting balance - Comments: posterior lean Postural control: Posterior lean                                   Pertinent Vitals/Pain Pain Assessment: No/denies pain    Home Living Family/patient expects to be discharged to:: Private residence Living Arrangements: Children Available Help at Discharge: Family;Available 24 hours/day Type of Home: House Home Access: Stairs to enter Entrance Stairs-Rails: None Entrance Stairs-Number of Steps: 2   Home Equipment: [place] - 2 wheels;Wheelchair - manual Additional Comments: per chart, pt is w/c and bed bound. Son is caregiver.  Unsure if she performs SPT or is continent    Prior Function           Comments: unsure of amount of A, anticipate extensive to total; no family present to provide hx, pt not able to provide home info or PLOF 2* dementa, info above obtained from chart     Hand Dominance        Extremity/Trunk Assessment   Upper Extremity Assessment Upper  Extremity Assessment: Difficult to assess due to impaired cognition    Lower Extremity Assessment Lower Extremity Assessment: Difficult to assess due to impaired cognition(PROM appears grossly [facility])    Cervical / Trunk Assessment Cervical / Trunk Assessment: Kyphotic  Communication   Communication: HOH  Cognition Arousal/Alertness: Awake/alert Behavior During Therapy: WFL for tasks assessed/performed Overall Cognitive Status: No family/caregiver present to determine baseline cognitive functioning                                 General Comments: pt oriented to self and location, not to  month/year, situation; stated she lives with her mother      General Comments      Exercises     Assessment/Plan    PT Assessment Patient needs continued PT services  PT Problem List Decreased balance;Decreased activity tolerance;Decreased mobility       PT Treatment Interventions Functional mobility training;Therapeutic activities;Balance training;Patient/family education    PT Goals (Current goals can be found in the Care Plan section)  Acute Rehab PT Goals PT Goal Formulation: Patient unable to participate in goal setting Time For Goal Achievement: 08/21/17 Potential to Achieve Goals: Fair    Frequency Min 2X/week   Barriers to discharge        Co-evaluation               AM-PAC PT "6 Clicks" Daily Activity  Outcome Measure Difficulty turning over in bed (including adjusting bedclothes, sheets and blankets)?: Unable Difficulty moving from lying on back to sitting on the side of the bed? : Unable Difficulty sitting down on and standing up from a chair with arms (e.g., wheelchair, bedside commode, etc,.)?: Unable Help needed moving to and from a bed to chair (including a wheelchair)?: Total Help needed walking in hospital room?: Total Help needed climbing 3-5 steps with a railing? : Total 6 Click Score: 6    End of Session   Activity Tolerance: Patient limited by fatigue Patient left: in bed;with call bell/phone within reach;with bed alarm set Nurse Communication: Mobility status PT Visit Diagnosis: Difficulty in walking, not elsewhere classified (R26.2);Other abnormalities of gait and mobility (R26.89)    Time: 1415-1430 PT Time Calculation (min) (ACUTE ONLY): 15 min   Charges:   PT Evaluation $PT Eval Moderate Complexity: 1 Mod     PT G Codes:          Philomena Doheny 08/07/2017, 2:37 PM [phone]

## 2017-08-07 NOTE — Progress Notes (Signed)
Daily Progress Note   Patient Name: Sarah Perkins       Date: 08/07/2017 DOB: 04-04-1918  Age: 84 y.o. MRN#: [id number] Attending Physician: Hosie Poisson, MD Primary Care Physician: Thressa Sheller, MD Admit Date: 08/03/2017  Reason for Consultation/Follow-up: Establishing goals of care  Subjective: I saw and examined Sarah Perkins today.  She reports feeling well and denies any complaints today.  No family at bedside at home my encounter.  No concerns per nursing report today.  Length of Stay: 4  Current Medications: Scheduled Meds:  . amLODipine  5 mg Oral Daily  . feeding supplement (ENSURE ENLIVE)  237 mL Oral BID BM  . furosemide  20 mg Oral Daily  . Gerhardt's butt cream   Topical BID  . multivitamin with minerals  1 tablet Oral Daily  . pantoprazole (PROTONIX) IV  40 mg Intravenous Q24H    Continuous Infusions: . sodium chloride      PRN Meds: acetaminophen **OR** acetaminophen, ondansetron **OR** ondansetron (ZOFRAN) IV  Physical Exam         General: Alert, awake, in no acute distress.  HEENT: No bruits, no goiter, no JVD Heart: Regular rate and rhythm. No murmur appreciated. Lungs: Good air movement, clear Abdomen: Soft, nontender, nondistended, positive bowel sounds.  Ext: No significant edema Skin: Warm and dry Neuro: Grossly intact, nonfocal.    Vital Signs: BP (!) 141/66 (BP Location: Right Arm)   Pulse 88   Temp 98.5 F (36.9 C) (Oral)   Resp 18   Ht 5\' 6"  (1.676 m)   Wt 52.2 kg (115 lb)   SpO2 95%   BMI 18.56 kg/m  SpO2: SpO2: 95 % O2 Device: O2 Device: Nasal Cannula O2 Flow Rate: O2 Flow Rate (L/min): 2 L/min  Intake/output summary:   Intake/Output Summary (Last 24 hours) at 08/07/2017 1749 Last data filed at 08/07/2017  1300 Gross per 24 hour  Intake 1225 ml  Output 2350 ml  Net -1125 ml   LBM: Last BM Date: 08/04/17 Baseline Weight: Weight: 52.2 kg (115 lb) Most recent weight: Weight: 52.2 kg (115 lb)       Palliative Assessment/Data:    Flowsheet Rows     Most Recent Value  Intake Tab  Referral Department  Hospitalist  Unit at Time of Referral  Intermediate Care Unit  Palliative Care Primary Diagnosis  Other (Comment) [GI]  Date Notified  08/04/17  Palliative Care Type  Return patient Palliative Care  Reason for referral  Clarify Goals of Care  Date of Admission  08/03/17  Date first seen by Palliative Care  08/05/17  # of days Palliative referral response time  1 Day(s)  # of days IP prior to Palliative referral  1  Clinical Assessment  Palliative Performance Scale Score  40%  Pain Max last 24 hours  5  Pain Min Last 24 hours  0  Psychosocial & Spiritual Assessment  Palliative Care Outcomes  Palliative Care Outcomes  Clarified goals of care      Patient Active Problem List   Diagnosis Date Noted  . Acute lower GI bleeding 08/03/2017  . Hemorrhagic shock ([place]) 08/03/2017  . Pressure injury of skin 12/04/2016  . Hematochezia 12/03/2016  . Sepsis ([place]) 03/27/2016  . Acute respiratory failure with hypoxemia ([place]) 03/27/2016  . Closed fracture of right femur ([place])   . Malnutrition of moderate degree 10/24/2015  . Femur fracture, right ([place]) 10/21/2015  . Duodenal ulcer, acute 06/04/2015  . Essential hypertension 06/02/2015  . Syncope 06/02/2015  . Abdominal pain 06/02/2015  . Elevated lactic acid level 06/02/2015  . Faintness   . Dehydration 03/29/2015  . FTT (failure to thrive) in adult 03/29/2015  . Pressure ulcer 03/23/2015  . CKD (chronic kidney disease) stage 2, GFR 60-89 ml/min 03/23/2015  . Duodenitis without bleeding 03/22/2015  . Azotemia 03/22/2015  . Acute hypokalemia 03/22/2015  . Constipation   . Cough   . Palliative care encounter   . Acute bronchitis  10/08/2014  . Anemia, iron deficiency 10/08/2014  . Rectal bleeding 08/21/2014  . Chronic diastolic CHF (congestive heart failure) ([place]) 02/22/2014  . Acute GI bleeding 02/14/2014  . Abnormal EKG 02/14/2014  . Dementia 01/28/2013  . Acute posthemorrhagic anemia 09/20/2011  . CHF (congestive heart failure) ([place]) 05/15/2011  . Acute on chronic kidney failure ([place]), stage ii 05/15/2011  . GERD (gastroesophageal reflux disease) 05/15/2011  . Diverticulosis 05/15/2011    Palliative Care Assessment & Plan   Patient Profile: 84 y.o. female  with past medical history of dementia, recurrent diverticular bleeds admitted on 08/03/2017 with BRBPR.  She was last admitted in Aug 2018 and deemed not a candidate for further colonoscopies or surgical intervention at that time.  Palliative consulted for Power.  Recommendations/Plan:  Continue current therapies.  Son hopeful she will improve with supportive care.  Moniter for signs of continued bleeding.   Code Status:    Code Status Orders  (From admission, onward)        Start     Ordered   08/03/17 1940  Do not attempt resuscitation (DNR)  Continuous    Question Answer Comment  In the event of cardiac or respiratory ARREST Do not call a "code blue"   In the event of cardiac or respiratory ARREST Do not perform Intubation, CPR, defibrillation or ACLS   In the event of cardiac or respiratory ARREST Use medication by any route, position, wound care, and other measures to relive pain and suffering. May use oxygen, suction and manual treatment of airway obstruction as needed for comfort.      08/03/17 1941    Code Status History    Date Active Date Inactive Code Status Order ID Comments User Context   12/03/2016 1636 12/08/2016 1845 DNR [id number]  Vashti Hey, MD Inpatient   12/03/2016 1425 12/03/2016 1636 DNR  [id number]  Vashti Hey, MD ED   03/28/2016 [phone] 03/31/2016 1834 DNR [id number]  Domenic Polite, MD Inpatient    03/27/2016 1518 03/28/2016 0943 Full Code [id number]  Elwin Mocha, MD ED   10/21/2015 1938 10/26/2015 1715 DNR [id number]  Albertine Patricia, MD Inpatient   06/02/2015 0049 06/08/2015 1714 DNR [id number]  Ivor Costa, MD ED   03/22/2015 1829 03/26/2015 1936 DNR [id number]  Samella Parr, NP Inpatient   10/08/2014 1523 10/11/2014 1741 Full Code [id number]  Samella Parr, NP Inpatient   08/21/2014 2001 08/24/2014 2121 Full Code [id number]  Etta Quill, DO ED   02/14/2014 1706 02/19/2014 1548 Partial Code [id number]  Annita Brod, MD Inpatient   02/14/2014 0908 02/14/2014 1706 Partial Code [id number]  Renne Musca, MD ED   01/28/2013 0348 02/01/2013 1838 Full Code [id number]  Phillips Grout, MD Inpatient   04/19/2012 0156 04/28/2012 2017 Full Code [id number]  Pollie Friar, RN Inpatient   09/21/2011 1411 09/25/2011 1704 DNR [id number]  Jonetta Osgood, MD Inpatient   05/15/2011 1144 05/21/2011 1845 DNR [id number]  Garth Bigness, RN Inpatient    Advance Directive Documentation     Most Recent Value  Type of Advance Directive  Healthcare Power of Attorney  Pre-existing out of facility DNR order (yellow form or pink MOST form)  -  "MOST" Form in Place?  -       Prognosis:   Guarded  Discharge Planning:  [place] for rehab with Palliative care service follow-up most likely  Care plan was discussed with RN, patient  Thank you for allowing the Palliative Medicine Team to assist in the care of this patient.   Total Time 20 Prolonged Time Billed No      Greater than 50%  of this time was spent counseling and coordinating care related to the above assessment and plan.  Micheline Rough, MD  Please contact Palliative Medicine Team phone at [phone] for questions and concerns.

## 2017-08-07 NOTE — Progress Notes (Signed)
Initial Nutrition Assessment  DOCUMENTATION CODES:   Non-severe (moderate) malnutrition in context of chronic illness, Underweight  INTERVENTION:  - Will order Ensure Enlive BID, each supplement provides 350 kcal and 20 grams of protein. - Will order daily multivitamin with minerals.  - Continue to encourage PO intakes.   NUTRITION DIAGNOSIS:   Moderate Malnutrition related to chronic illness(mild dementia, recurrent diverticulitis with bleeding) as evidenced by percent weight loss, moderate muscle depletion, mild fat depletion.  GOAL:   Patient will meet greater than or equal to 90% of their needs  MONITOR:   PO intake, Supplement acceptance, Weight trends, Labs  REASON FOR ASSESSMENT:   Other (Comment)(underweight BMI)  ASSESSMENT:   84 y.o. female with medical history significant of mild dementia, HTN, recurrent diverticular bleeds in past, last was August 2018. Patient treated with transfusions and supportive care at that time. Patient not felt to be candidate for further colonoscopies and not surgery. Presents to ED after multiple episodes of BRBPR, large amount of blood loss, had episode in ED. She also had an episode of hypotension responded to fluid resuscitation.   Diet advanced from CLD to Soft today at 9:22 AM. Per chart review, pt consumed 100% of CLD breakfast on 4/9 and no other intakes documented. Lunch tray on bedside table and pt had consumed ~50% of grilled cheese sandwich and ~75% of mashed potatoes with gravy. Pt mainly sleeping during RD visit and her son was at bedside. Son provided all information.   He reports that she has not had much to eat since Sunday (4/7) and that she has a poor appetite at baseline. He prepares 3 meals/day for her. Breakfast is often scrambled eggs with cheddar cheese or a sausage biscuit, lunch is soup and/or sandwich, and dinner is something like meatloaf with mashed potatoes. Son reports that pt has poor dentition and is unable to  chew foods that are not permitted on soft diet (crunchy, raw fruits and vegetables, tough meats).   Physical assessment outlined below. Unable to obtain weight-related information from pt's son at this time. Per chart review, pt has lost 26 lbs (18% body weight) in the past 8 months. This is significant for time frame.   Palliative following pt. She is currently DNR. Her son is hopeful for her to be able to go to SNF for rehab at time of d/c.   Medications reviewed; 20 mg oral lasix/day, 40 mg IV Protonix/day.  Labs reviewed; K: 3.3 mmol/L, Cl: 113 mmol/L, Ca: 8.1 mg/dL.     NUTRITION - FOCUSED PHYSICAL EXAM:    Most Recent Value  Orbital Region  Mild depletion  Upper Arm Region  Mild depletion  Thoracic and Lumbar Region  Unable to assess  Buccal Region  Moderate depletion  Temple Region  Moderate depletion  Clavicle Bone Region  Moderate depletion  Clavicle and Acromion Bone Region  Moderate depletion  Scapular Bone Region  Unable to assess  Dorsal Hand  Severe depletion  Patellar Region  No depletion  Anterior Thigh Region  No depletion  Posterior Calf Region  No depletion  Edema (RD Assessment)  Moderate  Hair  Reviewed  Eyes  Reviewed  Mouth  Reviewed  Skin  Reviewed  Nails  Reviewed       Diet Order:  DIET SOFT Room service appropriate? Yes; Fluid consistency: Thin  EDUCATION NEEDS:   No education needs have been identified at this time  Skin:  Skin Assessment: Skin Integrity Issues: Skin Integrity Issues:: Other (Comment) Other:  quarter-sized wound to chest (4/10)  Last BM:  4/8  Height:   Ht Readings from Last 1 Encounters:  08/03/17 5\' 6"  (1.676 m)    Weight:   Wt Readings from Last 1 Encounters:  08/03/17 115 lb (52.2 kg)    Ideal Body Weight:  59.09 kg  BMI:  Body mass index is 18.56 kg/m.  Estimated Nutritional Needs:   Kcal:  1220-1400 (28-32 kcal/kg)  Protein:  52-65 grams (1.2-1.5 grams/kg)  Fluid:  >/= 1.5  L/day      Jarome Matin, MS, RD, LDN, [facility] Inpatient Clinical Dietitian Pager # [phone] After hours/weekend pager # [phone]

## 2017-08-07 NOTE — Progress Notes (Signed)
PROGRESS NOTE    Sarah Perkins  QIH:[id number] DOB: 08-29-1917 DOA: 08/03/2017 PCP: Thressa Sheller, MD    Brief Narrative: Sarah Perkins is a 84 y.o. female with medical history significant of mild dementia, HTN, recurrent diverticular bleeds in past, last was Aug 2018.  Patient treated with transfusions and supportive care at that time.  Patient not felt to be candidate for further colonoscopies and certianly not surgery. Presents to ED after   multiple episodes of BRBPR, large amount of blood loss, had episode in ED. She also had an episode of hypotension responded to fluid resuscitation.       Assessment & Plan:   Principal Problem:   Hemorrhagic shock ([place]) Active Problems:   GERD (gastroesophageal reflux disease)   Dementia   Rectal bleeding   Palliative care encounter   FTT (failure to thrive) in adult   Essential hypertension   Malnutrition of moderate degree   Acute lower GI bleeding   Hemorrhagic shock:  Resolved.  Responded to fluid resuscitation. Keep MAP >65 mmhg.     Rectal bleeding secondary to diverticulosis: appears to have stopped.  Pt received 6 units of prbc transfusion so far.   H&H to keep it greater than 7.  Advanced diet and plan for d/c in am if no episodes of bleeding.  Change protonix to po.    Hypotension: resolved.   Hypocalcemia:  Will start her on calcium supplements on discharge or once she is able to take po.   Mild thrombocytopenia:  Resolved.   GERD:  ON PPI.    Dementia:  Stable, no agitation.    Hypokalemia; Replaced.   Acute blood loss anemia from diverticular bleed.  Transfuse to keep hemoglobin greater than 7.  In view of multiple admissions, requested palliative care consult for goals of care.    Chronic diastolic heart failure:  She appears euvolemic.    Chest wall wound: Wound care consulted and recommendations given.    DVT prophylaxis:  scd's Code Status: DNR Family Communication:  none at bedside.  Disposition Plan: SNF vs home.    Consultants:  None.   Procedures: none.    Antimicrobials: none.    Subjective: She appears comfortable, no new complaints.   Objective: Vitals:   08/07/17 0800 08/07/17 1200 08/07/17 1441 08/07/17 1443  BP: (!) 150/60 (!) 151/54 (!) 130/112 (!) 141/66  Pulse: 82 83 87 88  Resp: 15 20 18 18   Temp: (!) 97.4 F (36.3 C) 98 F (36.7 C) 98.5 F (36.9 C)   TempSrc: Oral Oral Oral   SpO2: 95% 93% 96% 95%  Weight:      Height:        Intake/Output Summary (Last 24 hours) at 08/07/2017 1927 Last data filed at 08/07/2017 1300 Gross per 24 hour  Intake 1093.75 ml  Output 2350 ml  Net -1256.25 ml   Filed Weights   08/03/17 2210  Weight: 52.2 kg (115 lb)    Examination:  General exam: in good spirits, not in distress.  Respiratory system: air entry fair. . No wheezing or rhonchi.   Cardiovascular system: S1 & S2 heard, RRR. No JVD, murmurs, No pedal edema. Gastrointestinal system: abd is soft non tender non distended bowel sounds heard.  Central nervous system: Alert and oriented to person and place. NON focal.  Extremities: no cyanosis or clubbing.  Skin: No rashes, lesions or ulcers Psychiatry:  Mood & affect appropriate.     Data Reviewed: I have personally reviewed following labs  and imaging studies  CBC: Recent Labs  Lab 08/03/17 1749 08/04/17 0521  08/05/17 0146 08/05/17 1807 08/06/17 0638 08/06/17 1802 08/07/17 0551 08/07/17 1759  WBC 8.1 8.6  --  14.6*  --   --   --  15.8*  --   HGB 10.4* 10.7*   < > 8.9* 8.4* 7.6* 12.9 10.9* 11.5*  HCT 32.3* 32.8*   < > 26.9* 24.7* 22.4* 38.6 32.8* 34.6*  MCV 97.6 95.6  --  90.6  --   --   --  90.6  --   PLT 261 162  --  140*  --   --   --  157  --    < > = values in this interval not displayed.   Basic Metabolic Panel: Recent Labs  Lab 08/03/17 1749 08/04/17 0521 08/04/17 1254 08/05/17 0146 08/07/17 0551  NA 144 143  --  145 143  K 4.0 6.1* 3.2* 4.2  3.3*  CL 108 111  --  116* 113*  CO2 28 24  --  22 24  GLUCOSE 115* 192*  --  104* 106*  BUN 20 18  --  25* 13  CREATININE 0.78 0.78  --  0.80 0.62  CALCIUM 8.8* 7.5*  --  7.7* 8.1*   GFR: Estimated Creatinine Clearance: 33.1 mL/min (by C-G formula based on SCr of 0.62 mg/dL). Liver Function Tests: No results for input(s): AST, ALT, ALKPHOS, BILITOT, PROT, ALBUMIN in the last 168 hours. No results for input(s): LIPASE, AMYLASE in the last 168 hours. No results for input(s): AMMONIA in the last 168 hours. Coagulation Profile: No results for input(s): INR, PROTIME in the last 168 hours. Cardiac Enzymes: No results for input(s): CKTOTAL, CKMB, CKMBINDEX, TROPONINI in the last 168 hours. BNP (last 3 results) No results for input(s): PROBNP in the last 8760 hours. HbA1C: No results for input(s): HGBA1C in the last 72 hours. CBG: No results for input(s): GLUCAP in the last 168 hours. Lipid Profile: No results for input(s): CHOL, HDL, LDLCALC, TRIG, CHOLHDL, LDLDIRECT in the last 72 hours. Thyroid Function Tests: No results for input(s): TSH, T4TOTAL, FREET4, T3FREE, THYROIDAB in the last 72 hours. Anemia Panel: No results for input(s): VITAMINB12, FOLATE, FERRITIN, TIBC, IRON, RETICCTPCT in the last 72 hours. Sepsis Labs: No results for input(s): PROCALCITON, LATICACIDVEN in the last 168 hours.  Recent Results (from the past 240 hour(s))  MRSA PCR Screening     Status: None   Collection Time: 08/06/17  9:00 AM  Result Value Ref Range Status   MRSA by PCR NEGATIVE NEGATIVE Final    Comment:        The GeneXpert MRSA Assay (FDA approved for NASAL specimens only), is one component of a comprehensive MRSA colonization surveillance program. It is not intended to diagnose MRSA infection nor to guide or monitor treatment for MRSA infections. Performed at [facility], [place] [address], [place], [place] [address]          Radiology Studies: No results  found.      Scheduled Meds: . amLODipine  5 mg Oral Daily  . feeding supplement (ENSURE ENLIVE)  237 mL Oral BID BM  . furosemide  20 mg Oral Daily  . Gerhardt's butt cream   Topical BID  . multivitamin with minerals  1 tablet Oral Daily  . pantoprazole (PROTONIX) IV  40 mg Intravenous Q24H   Continuous Infusions: . sodium chloride       LOS: 4 days  Time spent: 34 minutes.     Hosie Poisson, MD [facility] Pager [phone] If 7PM-7AM, please contact night-coverage [email or web address] Password [facility] 08/07/2017, 7:27 PM

## 2017-08-08 LAB — BASIC METABOLIC PANEL
ANION GAP: 7 (ref 5–15)
BUN: 13 mg/dL (ref 6–20)
CALCIUM: 8.1 mg/dL — AB (ref 8.9–10.3)
CO2: 24 mmol/L (ref 22–32)
CREATININE: 0.72 mg/dL (ref 0.44–1.00)
Chloride: 110 mmol/L (ref 101–111)
Glucose, Bld: 101 mg/dL — ABNORMAL HIGH (ref 65–99)
Potassium: 3.8 mmol/L (ref 3.5–5.1)
SODIUM: 141 mmol/L (ref 135–145)

## 2017-08-08 LAB — HEMOGLOBIN AND HEMATOCRIT, BLOOD
HCT: 32.4 % — ABNORMAL LOW (ref 36.0–46.0)
HEMATOCRIT: 34.2 % — AB (ref 36.0–46.0)
HEMOGLOBIN: 11.2 g/dL — AB (ref 12.0–15.0)
Hemoglobin: 10.8 g/dL — ABNORMAL LOW (ref 12.0–15.0)

## 2017-08-08 MED ORDER — GERHARDT'S BUTT CREAM: 1.0000 "application " | TOPICAL_CREAM | Freq: Two times a day (BID) | CUTANEOUS | 1 refills | Status: AC

## 2017-08-08 NOTE — Clinical Social Work Note (Signed)
Clinical Social Work Assessment  Patient Details  Name: Sarah Perkins MRN: [id number] Date of Birth: Jun 10, 1917  Date of referral:  08/08/17               Reason for consult:  Facility Placement                Permission sought to share information with:  Case Manager, Customer service manager, Family Supports Permission granted to share information::  Yes, Verbal Permission Granted  Name::     Veterinary surgeon::  SNF  Relationship::  Son  Sport and exercise psychologist Information:     Housing/Transportation Living arrangements for the past 2 months:  Single Family Home Source of Information:  Adult Children Patient Interpreter Needed:  None Criminal Activity/Legal Involvement Pertinent to Current Situation/Hospitalization:  No - Comment as needed Significant Relationships:  Adult Children Lives with:  Adult Children Do you feel safe going back to the place where you live?  Yes Need for family participation in patient care:  Yes (Comment)  Care giving concerns:  No care giving concerns at the time of assessment.    Social Worker assessment / plan:  LCSW following for SNF placement.   Patient admitted for Hemorrhagic Shock.   LCSW spoke with patients son, Sarah Perkins by phone. Patient is only oriented to self and place. LCSW explained role and reason for call.   Sarah Perkins reports he is the primary care giver for his mother. At baseline he reports that patient is in a wheelchair and can assist with transfers. Patient needs assistance with ADLs.   Family is agreeable to SNF at dc. Patient has been to SNF in the past and the report they are familiar with the process.   PLAN: SNF at dc.    Employment status:  Retired Forensic scientist:  Medicare PT Recommendations:  [place] / Referral to community resources:     Patient/Family's Response to care:  Family is thankful for LCSW visit.   Patient/Family's Understanding of and Emotional Response to Diagnosis,  Current Treatment, and Prognosis:  Family is understanding of diagnosis and agreeable to SNF at dc. Sarah Perkins states that pt is very week and needs rehab to regain her strength. Palliative met with family and per palliative note palliative will follow patient at dc.   Emotional Assessment Appearance:  Appears stated age Attitude/Demeanor/Rapport:    Affect (typically observed):    Orientation:  Oriented to Self, Oriented to Place Alcohol / Substance use:  Not Applicable Psych involvement (Current and /or in the community):  No (Comment)  Discharge Needs  Concerns to be addressed:  No discharge needs identified Readmission within the last 30 days:  Yes Current discharge risk:  None Barriers to Discharge:  No SNF bed   Servando Snare, LCSW 08/08/2017, 1:58 PM

## 2017-08-08 NOTE — Care Management Important Message (Signed)
Important Message  Patient Details  Name: Sarah Perkins MRN: [id number] Date of Birth: 02-11-1918   Medicare Important Message Given:  Yes    Kerin Salen 08/08/2017, 12:36 [place] Message  Patient Details  Name: Sarah Perkins MRN: [id number] Date of Birth: 02/28/1918   Medicare Important Message Given:  Yes    Kerin Salen 08/08/2017, 12:36 PM

## 2017-08-08 NOTE — NC FL2 (Signed)
[place] LEVEL OF CARE SCREENING TOOL     IDENTIFICATION  Patient Name: Sarah Perkins Birthdate: 1918-04-22 Sex: female Admission Date (Current Location): 08/03/2017  [facility] and [place] Number:  Herbalist and Address:  [facility],  [place] [address], [place]      Provider Number: [id number]  Attending Physician Name and Address:  Hosie Poisson, MD  Relative Name and Phone Number:       Current Level of Care: Hospital Recommended Level of Care: [place] Prior Approval Number:    Date Approved/Denied: 08/08/17 PASRR Number: [id number]  Discharge Plan: SNF    Current Diagnoses: Patient Active Problem List   Diagnosis Date Noted  . Acute lower GI bleeding 08/03/2017  . Hemorrhagic shock ([place]) 08/03/2017  . Pressure injury of skin 12/04/2016  . Hematochezia 12/03/2016  . Sepsis ([place]) 03/27/2016  . Acute respiratory failure with hypoxemia ([place]) 03/27/2016  . Closed fracture of right femur ([place])   . Malnutrition of moderate degree 10/24/2015  . Femur fracture, right ([place]) 10/21/2015  . Duodenal ulcer, acute 06/04/2015  . Essential hypertension 06/02/2015  . Syncope 06/02/2015  . Abdominal pain 06/02/2015  . Elevated lactic acid level 06/02/2015  . Faintness   . Dehydration 03/29/2015  . FTT (failure to thrive) in adult 03/29/2015  . Pressure ulcer 03/23/2015  . CKD (chronic kidney disease) stage 2, GFR 60-89 ml/min 03/23/2015  . Duodenitis without bleeding 03/22/2015  . Azotemia 03/22/2015  . Acute hypokalemia 03/22/2015  . Constipation   . Cough   . Palliative care encounter   . Acute bronchitis 10/08/2014  . Anemia, iron deficiency 10/08/2014  . Rectal bleeding 08/21/2014  . Chronic diastolic CHF (congestive heart failure) ([place]) 02/22/2014  . Acute GI bleeding 02/14/2014  . Abnormal EKG 02/14/2014  . Dementia 01/28/2013  . Acute posthemorrhagic anemia 09/20/2011  . CHF (congestive  heart failure) ([place]) 05/15/2011  . Acute on chronic kidney failure ([place]), stage ii 05/15/2011  . GERD (gastroesophageal reflux disease) 05/15/2011  . Diverticulosis 05/15/2011    Orientation RESPIRATION BLADDER Height & Weight     Self, Place  Normal Incontinent, External catheter Weight: 115 lb (52.2 kg) Height:  5\' 6"  (167.6 cm)  BEHAVIORAL SYMPTOMS/MOOD NEUROLOGICAL BOWEL NUTRITION STATUS      Incontinent Diet(See dc summary)  AMBULATORY STATUS COMMUNICATION OF NEEDS Skin   Extensive Assist(Wheel chair. Needs assistance with transfers) Verbally Other (Comment)(Chest, foam dressing, every 3 days)                       Personal Care Assistance Level of Assistance  Bathing, Feeding, Dressing Bathing Assistance: Maximum assistance Feeding assistance: Independent Dressing Assistance: Maximum assistance     Functional Limitations Info  Sight, Hearing, Speech Sight Info: Impaired Hearing Info: Impaired Speech Info: Adequate    SPECIAL CARE FACTORS FREQUENCY  PT (By licensed PT), OT (By licensed OT)     PT Frequency: 5x/week OT Frequency: 5x/week            Contractures Contractures Info: Not present    Additional Factors Info  Code Status, Allergies Code Status Info: DNR Allergies Info: NKA           Current Medications (08/08/2017):  This is the current hospital active medication list Current Facility-Administered Medications  Medication Dose Route Frequency Provider Last Rate Last Dose  . 0.9 %  sodium chloride infusion   Intravenous Continuous Hosie Poisson, MD      .  acetaminophen (TYLENOL) tablet 650 mg  650 mg Oral Q6H PRN Hosie Poisson, MD       Or  . acetaminophen (TYLENOL) suppository 650 mg  650 mg Rectal Q6H PRN Hosie Poisson, MD      . amLODipine (NORVASC) tablet 5 mg  5 mg Oral Daily Hosie Poisson, MD   5 mg at 08/08/17 1049  . feeding supplement (ENSURE ENLIVE) (ENSURE ENLIVE) liquid 237 mL  237 mL Oral BID BM Hosie Poisson, MD   237 mL at  08/08/17 1050  . furosemide (LASIX) tablet 20 mg  20 mg Oral Daily Hosie Poisson, MD   20 mg at 08/08/17 1049  . Gerhardt's butt cream   Topical BID Hosie Poisson, MD      . multivitamin with minerals tablet 1 tablet  1 tablet Oral Daily Hosie Poisson, MD   1 tablet at 08/08/17 1049  . ondansetron (ZOFRAN) tablet 4 mg  4 mg Oral Q6H PRN Hosie Poisson, MD       Or  . ondansetron (ZOFRAN) injection 4 mg  4 mg Intravenous Q6H PRN Hosie Poisson, MD      . pantoprazole (PROTONIX) injection 40 mg  40 mg Intravenous Q24H Hosie Poisson, MD   40 mg at 08/07/17 1626     Discharge Medications: Please see discharge summary for a list of discharge medications.  Relevant Imaging Results:  Relevant Lab Results:   Additional Information SS#: [id number], Palliative to follow.   Servando Snare, LCSW

## 2017-08-08 NOTE — Discharge Summary (Signed)
Physician Discharge Summary  Sarah Perkins HBZ:[id number] DOB: [birth year] DOA: 08/03/2017  PCP: Thressa Sheller, MD  Admit date: 08/03/2017 Discharge date: 08/09/2017  Admitted From: home.  Disposition:SNF  Recommendations for Outpatient Follow-up:  1. Follow up with PCP in 1-2 weeks 2. Please obtain BMP/CBC in one week :    Discharge Condition:GUARDED.  CODE STATUS:DNR.  Diet recommendation: Heart Healthy   Brief/Interim Summary: Sarah Rief Richardsonis a 84 y.o.femalewith medical history significant ofmild dementia, HTN, recurrent diverticular bleeds in past, last was Aug 2018. Patient treated with transfusions and supportive care at that time. Patient not felt to be candidate for further colonoscopies and certianly not surgery. Presents to ED after   multiple episodes of BRBPR, large amount of blood loss, had episode in ED. She also had an episode of hypotension responded to fluid resuscitation.     Discharge Diagnoses:  Principal Problem:   Hemorrhagic shock ([place]) Active Problems:   GERD (gastroesophageal reflux disease)   Dementia   Rectal bleeding   Palliative care encounter   FTT (failure to thrive) in adult   Essential hypertension   Malnutrition of moderate degree   Acute lower GI bleeding  Hemorrhagic shock:  Resolved.  Responded to fluid resuscitation. Restart bp medications on discharge.     Rectal bleeding secondary to diverticulosis: appears to have stopped.  Pt received 6 units of prbc transfusion so far.   H&H to keep it greater than 7.  Advanced diet and plan for d/c in am if no episodes of bleeding.  Change protonix to po.    Hypotension: resolved.   Hypocalcemia:  Will start her on calcium supplements on discharge or once she is able to take po.   Mild thrombocytopenia:  Resolved.   GERD:  ON PPI.    Dementia:  Stable, no agitation.    Hypokalemia; Replaced.   Acute blood loss anemia from diverticular  bleed.  Transfuse to keep hemoglobin greater than 7.  In view of multiple admissions, requested palliative care consult for goals of care.    Chronic diastolic heart failure:  She appears euvolemic.    Chest wall wound: Wound care consulted and recommendations given.      Discharge Instructions   Allergies as of 08/08/2017   No Known Allergies     Medication List    STOP taking these medications   potassium chloride SA 20 MEQ tablet Commonly known as:  K-DUR,KLOR-CON     TAKE these medications   acetaminophen 325 MG tablet Commonly known as:  TYLENOL Take 2 tablets (650 mg total) by mouth every 6 (six) hours as needed for mild pain or moderate pain (or Fever >/= 101).   amLODipine 5 MG tablet Commonly known as:  NORVASC Take 5 mg by mouth daily.   CALCIUM-MAGNESIUM-ZINC PO Take 1 capsule by mouth daily.   cyanocobalamin 100 MCG tablet Take 100 mcg by mouth daily.   docusate sodium 100 MG capsule Commonly known as:  COLACE Take 100 mg by mouth at bedtime.   feeding supplement Liqd Take 1 Container by mouth 3 (three) times daily between meals.   ferrous sulfate 325 (65 FE) MG tablet Take 325 mg by mouth daily with breakfast.   furosemide 20 MG tablet Commonly known as:  LASIX Take 1 tablet (20 mg total) by mouth daily.   Gerhardt's butt cream Crea Apply 1 application topically 2 (two) times daily.   hydrocerin Crea Apply 1 application topically 2 (two) times daily.   multivitamin Liqd Take  15 mLs by mouth daily.   neomycin-bacitracin-polymyxin [phone] ointment Apply topically 4 (four) times daily as needed (sores).   pantoprazole 40 MG tablet Commonly known as:  PROTONIX Take 1 tablet (40 mg total) by mouth daily.   polyethylene glycol packet Commonly known as:  MIRALAX / GLYCOLAX Take 17 g by mouth every morning.   vitamin C 500 MG tablet Commonly known as:  ASCORBIC ACID Take 500 mg by mouth daily.       No Known  Allergies  Consultations:  Palliative care.    Procedures/Studies: No results found.    Subjective: No new complaints.   Discharge Exam: Vitals:   08/08/17 0527 08/08/17 1400  BP: (!) 124/58 (!) 127/55  Pulse: 82 73  Resp: 14 16  Temp: 99 F (37.2 C) 98 F (36.7 C)  SpO2: 91% 98%   Vitals:   08/07/17 1443 08/07/17 2204 08/08/17 0527 08/08/17 1400  BP: (!) 141/66 137/61 (!) 124/58 (!) 127/55  Pulse: 88 86 82 73  Resp: 18 20 14 16   Temp:  98.4 F (36.9 C) 99 F (37.2 C) 98 F (36.7 C)  TempSrc:  Oral Oral Oral  SpO2: 95% 93% 91% 98%  Weight:      Height:        General: Pt is alert, awake, not in acute distress Cardiovascular: RRR, S1/S2 +, no rubs, no gallops Respiratory: CTA bilaterally, no wheezing, no rhonchi Abdominal: Soft, NT, ND, bowel sounds + Extremities: no edema, no cyanosis    The results of significant diagnostics from this hospitalization (including imaging, microbiology, ancillary and laboratory) are listed below for reference.     Microbiology: Recent Results (from the past 240 hour(s))  MRSA PCR Screening     Status: None   Collection Time: 08/06/17  9:00 AM  Result Value Ref Range Status   MRSA by PCR NEGATIVE NEGATIVE Final    Comment:        The GeneXpert MRSA Assay (FDA approved for NASAL specimens only), is one component of a comprehensive MRSA colonization surveillance program. It is not intended to diagnose MRSA infection nor to guide or monitor treatment for MRSA infections. Performed at [facility], [place] [address] [place], [place] [address]      Labs: BNP (last 3 results) No results for input(s): BNP in the last 8760 hours. Basic Metabolic Panel: Recent Labs  Lab 08/03/17 1749 08/04/17 0521 08/04/17 1254 08/05/17 0146 08/07/17 0551 08/08/17 0540  NA 144 143  --  145 143 141  K 4.0 6.1* 3.2* 4.2 3.3* 3.8  CL 108 111  --  116* 113* 110  CO2 28 24  --  22 24 24   GLUCOSE 115* 192*  --   104* 106* 101*  BUN 20 18  --  25* 13 13  CREATININE 0.78 0.78  --  0.80 0.62 0.72  CALCIUM 8.8* 7.5*  --  7.7* 8.1* 8.1*   Liver Function Tests: No results for input(s): AST, ALT, ALKPHOS, BILITOT, PROT, ALBUMIN in the last 168 hours. No results for input(s): LIPASE, AMYLASE in the last 168 hours. No results for input(s): AMMONIA in the last 168 hours. CBC: Recent Labs  Lab 08/03/17 1749 08/04/17 0521  08/05/17 0146  08/06/17 0638 08/06/17 1802 08/07/17 0551 08/07/17 1759 08/08/17 0540  WBC 8.1 8.6  --  14.6*  --   --   --  15.8*  --   --   HGB 10.4* 10.7*   < > 8.9*   < >  7.6* 12.9 10.9* 11.5* 10.8*  HCT 32.3* 32.8*   < > 26.9*   < > 22.4* 38.6 32.8* 34.6* 32.4*  MCV 97.6 95.6  --  90.6  --   --   --  90.6  --   --   PLT 261 162  --  140*  --   --   --  157  --   --    < > = values in this interval not displayed.   Cardiac Enzymes: No results for input(s): CKTOTAL, CKMB, CKMBINDEX, TROPONINI in the last 168 hours. BNP: Invalid input(s): POCBNP CBG: No results for input(s): GLUCAP in the last 168 hours. D-Dimer No results for input(s): DDIMER in the last 72 hours. Hgb A1c No results for input(s): HGBA1C in the last 72 hours. Lipid Profile No results for input(s): CHOL, HDL, LDLCALC, TRIG, CHOLHDL, LDLDIRECT in the last 72 hours. Thyroid function studies No results for input(s): TSH, T4TOTAL, T3FREE, THYROIDAB in the last 72 hours.  Invalid input(s): FREET3 Anemia work up No results for input(s): VITAMINB12, FOLATE, FERRITIN, TIBC, IRON, RETICCTPCT in the last 72 hours. Urinalysis    Component Value Date/Time   COLORURINE YELLOW 08/06/2017 [place] 08/06/2017 0955   LABSPEC 1.020 08/06/2017 0955   PHURINE 5.0 08/06/2017 0955   GLUCOSEU NEGATIVE 08/06/2017 0955   HGBUR NEGATIVE 08/06/2017 0955   BILIRUBINUR NEGATIVE 08/06/2017 0955   KETONESUR NEGATIVE 08/06/2017 0955   PROTEINUR NEGATIVE 08/06/2017 0955   UROBILINOGEN 0.2 10/08/2014 1225    NITRITE NEGATIVE 08/06/2017 0955   LEUKOCYTESUR NEGATIVE 08/06/2017 0955   Sepsis Labs Invalid input(s): PROCALCITONIN,  WBC,  LACTICIDVEN Microbiology Recent Results (from the past 240 hour(s))  MRSA PCR Screening     Status: None   Collection Time: 08/06/17  9:00 AM  Result Value Ref Range Status   MRSA by PCR NEGATIVE NEGATIVE Final    Comment:        The GeneXpert MRSA Assay (FDA approved for NASAL specimens only), is one component of a comprehensive MRSA colonization surveillance program. It is not intended to diagnose MRSA infection nor to guide or monitor treatment for MRSA infections. Performed at [facility], [place] [address] [place], [place] [address]      Time coordinating discharge: 35 minutes.    SIGNED:   Hosie Poisson, MD  [facility] 08/08/2017, 6:07 PM Pager   If 7PM-7AM, please contact night-coverage [email or web address] Password [email or web address]

## 2017-08-09 DIAGNOSIS — R5381 Other malaise: Secondary | ICD-10-CM | POA: Diagnosis not present

## 2017-08-09 DIAGNOSIS — M545 Low back pain: Secondary | ICD-10-CM | POA: Diagnosis not present

## 2017-08-09 DIAGNOSIS — G309 Alzheimer's disease, unspecified: Secondary | ICD-10-CM | POA: Diagnosis not present

## 2017-08-09 DIAGNOSIS — F0280 Dementia in other diseases classified elsewhere without behavioral disturbance: Secondary | ICD-10-CM | POA: Diagnosis present

## 2017-08-09 DIAGNOSIS — M6281 Muscle weakness (generalized): Secondary | ICD-10-CM | POA: Diagnosis present

## 2017-08-09 DIAGNOSIS — G301 Alzheimer's disease with late onset: Secondary | ICD-10-CM | POA: Diagnosis not present

## 2017-08-09 DIAGNOSIS — K5793 Diverticulitis of intestine, part unspecified, without perforation or abscess with bleeding: Secondary | ICD-10-CM | POA: Diagnosis not present

## 2017-08-09 DIAGNOSIS — E538 Deficiency of other specified B group vitamins: Secondary | ICD-10-CM | POA: Diagnosis not present

## 2017-08-09 DIAGNOSIS — R1310 Dysphagia, unspecified: Secondary | ICD-10-CM | POA: Diagnosis not present

## 2017-08-09 DIAGNOSIS — R4182 Altered mental status, unspecified: Secondary | ICD-10-CM | POA: Diagnosis not present

## 2017-08-09 DIAGNOSIS — K625 Hemorrhage of anus and rectum: Secondary | ICD-10-CM | POA: Diagnosis present

## 2017-08-09 DIAGNOSIS — I509 Heart failure, unspecified: Secondary | ICD-10-CM | POA: Diagnosis not present

## 2017-08-09 DIAGNOSIS — I10 Essential (primary) hypertension: Secondary | ICD-10-CM | POA: Diagnosis not present

## 2017-08-09 DIAGNOSIS — H1033 Unspecified acute conjunctivitis, bilateral: Secondary | ICD-10-CM | POA: Diagnosis not present

## 2017-08-09 DIAGNOSIS — E46 Unspecified protein-calorie malnutrition: Secondary | ICD-10-CM | POA: Diagnosis not present

## 2017-08-09 DIAGNOSIS — R1312 Dysphagia, oropharyngeal phase: Secondary | ICD-10-CM | POA: Diagnosis present

## 2017-08-09 DIAGNOSIS — K219 Gastro-esophageal reflux disease without esophagitis: Secondary | ICD-10-CM | POA: Diagnosis present

## 2017-08-09 DIAGNOSIS — R54 Age-related physical debility: Secondary | ICD-10-CM | POA: Diagnosis not present

## 2017-08-09 DIAGNOSIS — K922 Gastrointestinal hemorrhage, unspecified: Secondary | ICD-10-CM | POA: Diagnosis not present

## 2017-08-09 DIAGNOSIS — L89893 Pressure ulcer of other site, stage 3: Secondary | ICD-10-CM | POA: Diagnosis not present

## 2017-08-09 DIAGNOSIS — R627 Adult failure to thrive: Secondary | ICD-10-CM | POA: Diagnosis present

## 2017-08-09 LAB — HEMOGLOBIN AND HEMATOCRIT, BLOOD
HCT: 48.6 % — ABNORMAL HIGH (ref 36.0–46.0)
HEMOGLOBIN: 15.7 g/dL — AB (ref 12.0–15.0)

## 2017-08-09 MED ORDER — PANTOPRAZOLE SODIUM 40 MG PO TBEC
40.0000 mg | DELAYED_RELEASE_TABLET | Freq: Every day | ORAL | Status: DC
  Administered 2017-08-09: 40 mg via ORAL
  Filled 2017-08-09: qty 1

## 2017-08-09 NOTE — Progress Notes (Signed)
Patient discharged to [facility] via [facility]. Awaiting  facility to call back for report at this time.

## 2017-08-09 NOTE — Clinical Social Work Placement (Addendum)
    12:06 PM Patient and family chose bed at [facility].   LCSW confirmed bed with facility. Room 154.   LCSW faxed dc docs to facility.   LCSW notified family.   Patient will transport by [facility].  RN report # [id number]  [id number]  CLINICAL SOCIAL WORK PLACEMENT  NOTE  Date:  08/09/2017  Patient Details  Name: Sarah Perkins MRN: [id number] Date of Birth: December 16, 1917  Clinical Social Work is seeking post-discharge placement for this patient at the [place] level of care (*CSW will initial, date and re-position this form in  chart as items are completed):  Yes   Patient/family provided with [place] Work Department's list of facilities offering this level of care within the geographic area requested by the patient (or if unable, by the patient's family).  Yes   Patient/family informed of their freedom to choose among providers that offer the needed level of care, that participate in Medicare, Medicaid or managed care program needed by the patient, have an available bed and are willing to accept the patient.  Yes   Patient/family informed of [facility]'s ownership interest in [facility] and [facility], as well as of the fact that they are under no obligation to receive care at these facilities.  PASRR submitted to EDS on       PASRR number received on 08/08/17     Existing PASRR number confirmed on       FL2 transmitted to all facilities in geographic area requested by pt/family on 08/08/17     FL2 transmitted to all facilities within larger geographic area on       Patient informed that his/her managed care company has contracts with or will negotiate with certain facilities, including the following:        Yes   Patient/family informed of bed offers received.  Patient chooses bed at [facility]     Physician recommends and patient chooses bed at      Patient to be  transferred to [facility] on 08/09/17.  Patient to be transferred to facility by EMS     Patient family notified on 08/09/17 of transfer.  Name of family member notified:  Charles     PHYSICIAN Please sign FL2     Additional Comment:    _______________________________________________ Servando Snare, LCSW 08/09/2017, 12:06 PM

## 2017-08-11 DIAGNOSIS — K5793 Diverticulitis of intestine, part unspecified, without perforation or abscess with bleeding: Secondary | ICD-10-CM | POA: Diagnosis not present

## 2017-08-11 DIAGNOSIS — I10 Essential (primary) hypertension: Secondary | ICD-10-CM | POA: Diagnosis not present

## 2017-08-11 DIAGNOSIS — E538 Deficiency of other specified B group vitamins: Secondary | ICD-10-CM | POA: Diagnosis not present

## 2017-08-11 DIAGNOSIS — R5381 Other malaise: Secondary | ICD-10-CM | POA: Diagnosis not present

## 2017-08-12 DIAGNOSIS — L89893 Pressure ulcer of other site, stage 3: Secondary | ICD-10-CM | POA: Diagnosis not present

## 2017-08-14 DIAGNOSIS — K5793 Diverticulitis of intestine, part unspecified, without perforation or abscess with bleeding: Secondary | ICD-10-CM | POA: Diagnosis not present

## 2017-08-14 DIAGNOSIS — G301 Alzheimer's disease with late onset: Secondary | ICD-10-CM | POA: Diagnosis not present

## 2017-08-14 DIAGNOSIS — I10 Essential (primary) hypertension: Secondary | ICD-10-CM | POA: Diagnosis not present

## 2017-08-14 DIAGNOSIS — E538 Deficiency of other specified B group vitamins: Secondary | ICD-10-CM | POA: Diagnosis not present

## 2017-08-18 DIAGNOSIS — I10 Essential (primary) hypertension: Secondary | ICD-10-CM | POA: Diagnosis not present

## 2017-08-18 DIAGNOSIS — R5381 Other malaise: Secondary | ICD-10-CM | POA: Diagnosis not present

## 2017-08-18 DIAGNOSIS — E538 Deficiency of other specified B group vitamins: Secondary | ICD-10-CM | POA: Diagnosis not present

## 2017-08-18 DIAGNOSIS — K5793 Diverticulitis of intestine, part unspecified, without perforation or abscess with bleeding: Secondary | ICD-10-CM | POA: Diagnosis not present

## 2017-08-19 DIAGNOSIS — L89893 Pressure ulcer of other site, stage 3: Secondary | ICD-10-CM | POA: Diagnosis not present

## 2017-08-25 DIAGNOSIS — E538 Deficiency of other specified B group vitamins: Secondary | ICD-10-CM | POA: Diagnosis not present

## 2017-08-25 DIAGNOSIS — M545 Low back pain: Secondary | ICD-10-CM | POA: Diagnosis not present

## 2017-08-25 DIAGNOSIS — G301 Alzheimer's disease with late onset: Secondary | ICD-10-CM | POA: Diagnosis not present

## 2017-08-25 DIAGNOSIS — H1033 Unspecified acute conjunctivitis, bilateral: Secondary | ICD-10-CM | POA: Diagnosis not present

## 2017-08-26 DIAGNOSIS — L89893 Pressure ulcer of other site, stage 3: Secondary | ICD-10-CM | POA: Diagnosis not present

## 2017-09-01 DIAGNOSIS — G301 Alzheimer's disease with late onset: Secondary | ICD-10-CM | POA: Diagnosis not present

## 2017-09-01 DIAGNOSIS — E538 Deficiency of other specified B group vitamins: Secondary | ICD-10-CM | POA: Diagnosis not present

## 2017-09-01 DIAGNOSIS — M545 Low back pain: Secondary | ICD-10-CM | POA: Diagnosis not present

## 2017-09-01 DIAGNOSIS — K5793 Diverticulitis of intestine, part unspecified, without perforation or abscess with bleeding: Secondary | ICD-10-CM | POA: Diagnosis not present

## 2017-09-02 DIAGNOSIS — L89893 Pressure ulcer of other site, stage 3: Secondary | ICD-10-CM | POA: Diagnosis not present

## 2017-09-03 DIAGNOSIS — M545 Low back pain: Secondary | ICD-10-CM | POA: Diagnosis not present

## 2017-09-03 DIAGNOSIS — K5793 Diverticulitis of intestine, part unspecified, without perforation or abscess with bleeding: Secondary | ICD-10-CM | POA: Diagnosis not present

## 2017-09-03 DIAGNOSIS — E538 Deficiency of other specified B group vitamins: Secondary | ICD-10-CM | POA: Diagnosis not present

## 2017-09-03 DIAGNOSIS — R5381 Other malaise: Secondary | ICD-10-CM | POA: Diagnosis not present

## 2017-09-08 DIAGNOSIS — R5381 Other malaise: Secondary | ICD-10-CM | POA: Diagnosis not present

## 2017-09-08 DIAGNOSIS — E538 Deficiency of other specified B group vitamins: Secondary | ICD-10-CM | POA: Diagnosis not present

## 2017-09-08 DIAGNOSIS — I10 Essential (primary) hypertension: Secondary | ICD-10-CM | POA: Diagnosis not present

## 2017-09-08 DIAGNOSIS — K5793 Diverticulitis of intestine, part unspecified, without perforation or abscess with bleeding: Secondary | ICD-10-CM | POA: Diagnosis not present

## 2017-12-28 DIAGNOSIS — K922 Gastrointestinal hemorrhage, unspecified: Secondary | ICD-10-CM | POA: Diagnosis not present

## 2017-12-28 DIAGNOSIS — R1310 Dysphagia, unspecified: Secondary | ICD-10-CM | POA: Diagnosis not present

## 2017-12-28 DIAGNOSIS — E46 Unspecified protein-calorie malnutrition: Secondary | ICD-10-CM | POA: Diagnosis not present

## 2017-12-28 DIAGNOSIS — I509 Heart failure, unspecified: Secondary | ICD-10-CM | POA: Diagnosis not present

## 2017-12-28 DIAGNOSIS — K219 Gastro-esophageal reflux disease without esophagitis: Secondary | ICD-10-CM | POA: Diagnosis not present

## 2017-12-28 DIAGNOSIS — R54 Age-related physical debility: Secondary | ICD-10-CM | POA: Diagnosis not present

## 2017-12-28 DIAGNOSIS — G309 Alzheimer's disease, unspecified: Secondary | ICD-10-CM | POA: Diagnosis not present

## 2017-12-28 DIAGNOSIS — I10 Essential (primary) hypertension: Secondary | ICD-10-CM | POA: Diagnosis not present

## 2017-12-29 DIAGNOSIS — E46 Unspecified protein-calorie malnutrition: Secondary | ICD-10-CM | POA: Diagnosis not present

## 2017-12-29 DIAGNOSIS — K922 Gastrointestinal hemorrhage, unspecified: Secondary | ICD-10-CM | POA: Diagnosis not present

## 2017-12-29 DIAGNOSIS — G309 Alzheimer's disease, unspecified: Secondary | ICD-10-CM | POA: Diagnosis not present

## 2017-12-29 DIAGNOSIS — R1310 Dysphagia, unspecified: Secondary | ICD-10-CM | POA: Diagnosis not present

## 2017-12-29 DIAGNOSIS — I10 Essential (primary) hypertension: Secondary | ICD-10-CM | POA: Diagnosis not present

## 2017-12-29 DIAGNOSIS — I509 Heart failure, unspecified: Secondary | ICD-10-CM | POA: Diagnosis not present

## 2017-12-30 DIAGNOSIS — I509 Heart failure, unspecified: Secondary | ICD-10-CM | POA: Diagnosis not present

## 2017-12-30 DIAGNOSIS — E46 Unspecified protein-calorie malnutrition: Secondary | ICD-10-CM | POA: Diagnosis not present

## 2017-12-30 DIAGNOSIS — K922 Gastrointestinal hemorrhage, unspecified: Secondary | ICD-10-CM | POA: Diagnosis not present

## 2017-12-30 DIAGNOSIS — G309 Alzheimer's disease, unspecified: Secondary | ICD-10-CM | POA: Diagnosis not present

## 2017-12-30 DIAGNOSIS — I10 Essential (primary) hypertension: Secondary | ICD-10-CM | POA: Diagnosis not present

## 2017-12-30 DIAGNOSIS — R1310 Dysphagia, unspecified: Secondary | ICD-10-CM | POA: Diagnosis not present

## 2017-12-31 DIAGNOSIS — K922 Gastrointestinal hemorrhage, unspecified: Secondary | ICD-10-CM | POA: Diagnosis not present

## 2017-12-31 DIAGNOSIS — I509 Heart failure, unspecified: Secondary | ICD-10-CM | POA: Diagnosis not present

## 2017-12-31 DIAGNOSIS — G309 Alzheimer's disease, unspecified: Secondary | ICD-10-CM | POA: Diagnosis not present

## 2017-12-31 DIAGNOSIS — R1310 Dysphagia, unspecified: Secondary | ICD-10-CM | POA: Diagnosis not present

## 2017-12-31 DIAGNOSIS — I10 Essential (primary) hypertension: Secondary | ICD-10-CM | POA: Diagnosis not present

## 2017-12-31 DIAGNOSIS — E46 Unspecified protein-calorie malnutrition: Secondary | ICD-10-CM | POA: Diagnosis not present

## 2018-01-01 DIAGNOSIS — I10 Essential (primary) hypertension: Secondary | ICD-10-CM | POA: Diagnosis not present

## 2018-01-01 DIAGNOSIS — K922 Gastrointestinal hemorrhage, unspecified: Secondary | ICD-10-CM | POA: Diagnosis not present

## 2018-01-01 DIAGNOSIS — I509 Heart failure, unspecified: Secondary | ICD-10-CM | POA: Diagnosis not present

## 2018-01-01 DIAGNOSIS — R1310 Dysphagia, unspecified: Secondary | ICD-10-CM | POA: Diagnosis not present

## 2018-01-01 DIAGNOSIS — E46 Unspecified protein-calorie malnutrition: Secondary | ICD-10-CM | POA: Diagnosis not present

## 2018-01-01 DIAGNOSIS — G309 Alzheimer's disease, unspecified: Secondary | ICD-10-CM | POA: Diagnosis not present

## 2018-01-02 DIAGNOSIS — K922 Gastrointestinal hemorrhage, unspecified: Secondary | ICD-10-CM | POA: Diagnosis not present

## 2018-01-02 DIAGNOSIS — G309 Alzheimer's disease, unspecified: Secondary | ICD-10-CM | POA: Diagnosis not present

## 2018-01-02 DIAGNOSIS — R1310 Dysphagia, unspecified: Secondary | ICD-10-CM | POA: Diagnosis not present

## 2018-01-02 DIAGNOSIS — I509 Heart failure, unspecified: Secondary | ICD-10-CM | POA: Diagnosis not present

## 2018-01-02 DIAGNOSIS — E46 Unspecified protein-calorie malnutrition: Secondary | ICD-10-CM | POA: Diagnosis not present

## 2018-01-02 DIAGNOSIS — I10 Essential (primary) hypertension: Secondary | ICD-10-CM | POA: Diagnosis not present

## 2018-01-05 DIAGNOSIS — I10 Essential (primary) hypertension: Secondary | ICD-10-CM | POA: Diagnosis not present

## 2018-01-05 DIAGNOSIS — R1310 Dysphagia, unspecified: Secondary | ICD-10-CM | POA: Diagnosis not present

## 2018-01-05 DIAGNOSIS — E46 Unspecified protein-calorie malnutrition: Secondary | ICD-10-CM | POA: Diagnosis not present

## 2018-01-05 DIAGNOSIS — K922 Gastrointestinal hemorrhage, unspecified: Secondary | ICD-10-CM | POA: Diagnosis not present

## 2018-01-05 DIAGNOSIS — G309 Alzheimer's disease, unspecified: Secondary | ICD-10-CM | POA: Diagnosis not present

## 2018-01-05 DIAGNOSIS — I509 Heart failure, unspecified: Secondary | ICD-10-CM | POA: Diagnosis not present

## 2018-01-06 DIAGNOSIS — I10 Essential (primary) hypertension: Secondary | ICD-10-CM | POA: Diagnosis not present

## 2018-01-06 DIAGNOSIS — G309 Alzheimer's disease, unspecified: Secondary | ICD-10-CM | POA: Diagnosis not present

## 2018-01-06 DIAGNOSIS — K922 Gastrointestinal hemorrhage, unspecified: Secondary | ICD-10-CM | POA: Diagnosis not present

## 2018-01-06 DIAGNOSIS — R1310 Dysphagia, unspecified: Secondary | ICD-10-CM | POA: Diagnosis not present

## 2018-01-06 DIAGNOSIS — I509 Heart failure, unspecified: Secondary | ICD-10-CM | POA: Diagnosis not present

## 2018-01-06 DIAGNOSIS — E46 Unspecified protein-calorie malnutrition: Secondary | ICD-10-CM | POA: Diagnosis not present

## 2018-01-07 DIAGNOSIS — R1310 Dysphagia, unspecified: Secondary | ICD-10-CM | POA: Diagnosis not present

## 2018-01-07 DIAGNOSIS — G309 Alzheimer's disease, unspecified: Secondary | ICD-10-CM | POA: Diagnosis not present

## 2018-01-07 DIAGNOSIS — E46 Unspecified protein-calorie malnutrition: Secondary | ICD-10-CM | POA: Diagnosis not present

## 2018-01-07 DIAGNOSIS — I509 Heart failure, unspecified: Secondary | ICD-10-CM | POA: Diagnosis not present

## 2018-01-07 DIAGNOSIS — K922 Gastrointestinal hemorrhage, unspecified: Secondary | ICD-10-CM | POA: Diagnosis not present

## 2018-01-07 DIAGNOSIS — I10 Essential (primary) hypertension: Secondary | ICD-10-CM | POA: Diagnosis not present

## 2018-01-09 DIAGNOSIS — I10 Essential (primary) hypertension: Secondary | ICD-10-CM | POA: Diagnosis not present

## 2018-01-09 DIAGNOSIS — G309 Alzheimer's disease, unspecified: Secondary | ICD-10-CM | POA: Diagnosis not present

## 2018-01-09 DIAGNOSIS — I509 Heart failure, unspecified: Secondary | ICD-10-CM | POA: Diagnosis not present

## 2018-01-09 DIAGNOSIS — E46 Unspecified protein-calorie malnutrition: Secondary | ICD-10-CM | POA: Diagnosis not present

## 2018-01-09 DIAGNOSIS — R1310 Dysphagia, unspecified: Secondary | ICD-10-CM | POA: Diagnosis not present

## 2018-01-09 DIAGNOSIS — K922 Gastrointestinal hemorrhage, unspecified: Secondary | ICD-10-CM | POA: Diagnosis not present

## 2018-01-12 DIAGNOSIS — I509 Heart failure, unspecified: Secondary | ICD-10-CM | POA: Diagnosis not present

## 2018-01-12 DIAGNOSIS — G309 Alzheimer's disease, unspecified: Secondary | ICD-10-CM | POA: Diagnosis not present

## 2018-01-12 DIAGNOSIS — K922 Gastrointestinal hemorrhage, unspecified: Secondary | ICD-10-CM | POA: Diagnosis not present

## 2018-01-12 DIAGNOSIS — I10 Essential (primary) hypertension: Secondary | ICD-10-CM | POA: Diagnosis not present

## 2018-01-12 DIAGNOSIS — E46 Unspecified protein-calorie malnutrition: Secondary | ICD-10-CM | POA: Diagnosis not present

## 2018-01-12 DIAGNOSIS — R1310 Dysphagia, unspecified: Secondary | ICD-10-CM | POA: Diagnosis not present

## 2018-01-13 DIAGNOSIS — K922 Gastrointestinal hemorrhage, unspecified: Secondary | ICD-10-CM | POA: Diagnosis not present

## 2018-01-13 DIAGNOSIS — G309 Alzheimer's disease, unspecified: Secondary | ICD-10-CM | POA: Diagnosis not present

## 2018-01-13 DIAGNOSIS — I10 Essential (primary) hypertension: Secondary | ICD-10-CM | POA: Diagnosis not present

## 2018-01-13 DIAGNOSIS — E46 Unspecified protein-calorie malnutrition: Secondary | ICD-10-CM | POA: Diagnosis not present

## 2018-01-13 DIAGNOSIS — R1310 Dysphagia, unspecified: Secondary | ICD-10-CM | POA: Diagnosis not present

## 2018-01-13 DIAGNOSIS — I509 Heart failure, unspecified: Secondary | ICD-10-CM | POA: Diagnosis not present

## 2018-01-14 DIAGNOSIS — I509 Heart failure, unspecified: Secondary | ICD-10-CM | POA: Diagnosis not present

## 2018-01-14 DIAGNOSIS — I10 Essential (primary) hypertension: Secondary | ICD-10-CM | POA: Diagnosis not present

## 2018-01-14 DIAGNOSIS — K922 Gastrointestinal hemorrhage, unspecified: Secondary | ICD-10-CM | POA: Diagnosis not present

## 2018-01-14 DIAGNOSIS — R1310 Dysphagia, unspecified: Secondary | ICD-10-CM | POA: Diagnosis not present

## 2018-01-14 DIAGNOSIS — G309 Alzheimer's disease, unspecified: Secondary | ICD-10-CM | POA: Diagnosis not present

## 2018-01-14 DIAGNOSIS — E46 Unspecified protein-calorie malnutrition: Secondary | ICD-10-CM | POA: Diagnosis not present

## 2018-01-16 DIAGNOSIS — R1310 Dysphagia, unspecified: Secondary | ICD-10-CM | POA: Diagnosis not present

## 2018-01-16 DIAGNOSIS — I509 Heart failure, unspecified: Secondary | ICD-10-CM | POA: Diagnosis not present

## 2018-01-16 DIAGNOSIS — K922 Gastrointestinal hemorrhage, unspecified: Secondary | ICD-10-CM | POA: Diagnosis not present

## 2018-01-16 DIAGNOSIS — E46 Unspecified protein-calorie malnutrition: Secondary | ICD-10-CM | POA: Diagnosis not present

## 2018-01-16 DIAGNOSIS — I10 Essential (primary) hypertension: Secondary | ICD-10-CM | POA: Diagnosis not present

## 2018-01-16 DIAGNOSIS — G309 Alzheimer's disease, unspecified: Secondary | ICD-10-CM | POA: Diagnosis not present

## 2018-01-19 DIAGNOSIS — G309 Alzheimer's disease, unspecified: Secondary | ICD-10-CM | POA: Diagnosis not present

## 2018-01-19 DIAGNOSIS — I509 Heart failure, unspecified: Secondary | ICD-10-CM | POA: Diagnosis not present

## 2018-01-19 DIAGNOSIS — E46 Unspecified protein-calorie malnutrition: Secondary | ICD-10-CM | POA: Diagnosis not present

## 2018-01-19 DIAGNOSIS — I10 Essential (primary) hypertension: Secondary | ICD-10-CM | POA: Diagnosis not present

## 2018-01-19 DIAGNOSIS — K922 Gastrointestinal hemorrhage, unspecified: Secondary | ICD-10-CM | POA: Diagnosis not present

## 2018-01-19 DIAGNOSIS — R1310 Dysphagia, unspecified: Secondary | ICD-10-CM | POA: Diagnosis not present

## 2018-01-20 DIAGNOSIS — I509 Heart failure, unspecified: Secondary | ICD-10-CM | POA: Diagnosis not present

## 2018-01-20 DIAGNOSIS — K922 Gastrointestinal hemorrhage, unspecified: Secondary | ICD-10-CM | POA: Diagnosis not present

## 2018-01-20 DIAGNOSIS — I10 Essential (primary) hypertension: Secondary | ICD-10-CM | POA: Diagnosis not present

## 2018-01-20 DIAGNOSIS — E46 Unspecified protein-calorie malnutrition: Secondary | ICD-10-CM | POA: Diagnosis not present

## 2018-01-20 DIAGNOSIS — G309 Alzheimer's disease, unspecified: Secondary | ICD-10-CM | POA: Diagnosis not present

## 2018-01-20 DIAGNOSIS — R1310 Dysphagia, unspecified: Secondary | ICD-10-CM | POA: Diagnosis not present

## 2018-01-21 DIAGNOSIS — G309 Alzheimer's disease, unspecified: Secondary | ICD-10-CM | POA: Diagnosis not present

## 2018-01-21 DIAGNOSIS — I10 Essential (primary) hypertension: Secondary | ICD-10-CM | POA: Diagnosis not present

## 2018-01-21 DIAGNOSIS — I509 Heart failure, unspecified: Secondary | ICD-10-CM | POA: Diagnosis not present

## 2018-01-21 DIAGNOSIS — R1310 Dysphagia, unspecified: Secondary | ICD-10-CM | POA: Diagnosis not present

## 2018-01-21 DIAGNOSIS — E46 Unspecified protein-calorie malnutrition: Secondary | ICD-10-CM | POA: Diagnosis not present

## 2018-01-21 DIAGNOSIS — K922 Gastrointestinal hemorrhage, unspecified: Secondary | ICD-10-CM | POA: Diagnosis not present

## 2018-01-23 DIAGNOSIS — K922 Gastrointestinal hemorrhage, unspecified: Secondary | ICD-10-CM | POA: Diagnosis not present

## 2018-01-23 DIAGNOSIS — R1310 Dysphagia, unspecified: Secondary | ICD-10-CM | POA: Diagnosis not present

## 2018-01-23 DIAGNOSIS — I509 Heart failure, unspecified: Secondary | ICD-10-CM | POA: Diagnosis not present

## 2018-01-23 DIAGNOSIS — G309 Alzheimer's disease, unspecified: Secondary | ICD-10-CM | POA: Diagnosis not present

## 2018-01-23 DIAGNOSIS — I10 Essential (primary) hypertension: Secondary | ICD-10-CM | POA: Diagnosis not present

## 2018-01-23 DIAGNOSIS — E46 Unspecified protein-calorie malnutrition: Secondary | ICD-10-CM | POA: Diagnosis not present

## 2018-01-25 DIAGNOSIS — G309 Alzheimer's disease, unspecified: Secondary | ICD-10-CM | POA: Diagnosis not present

## 2018-01-25 DIAGNOSIS — K922 Gastrointestinal hemorrhage, unspecified: Secondary | ICD-10-CM | POA: Diagnosis not present

## 2018-01-25 DIAGNOSIS — E46 Unspecified protein-calorie malnutrition: Secondary | ICD-10-CM | POA: Diagnosis not present

## 2018-01-25 DIAGNOSIS — I509 Heart failure, unspecified: Secondary | ICD-10-CM | POA: Diagnosis not present

## 2018-01-25 DIAGNOSIS — I10 Essential (primary) hypertension: Secondary | ICD-10-CM | POA: Diagnosis not present

## 2018-01-25 DIAGNOSIS — R1310 Dysphagia, unspecified: Secondary | ICD-10-CM | POA: Diagnosis not present

## 2018-01-26 DIAGNOSIS — I10 Essential (primary) hypertension: Secondary | ICD-10-CM | POA: Diagnosis not present

## 2018-01-26 DIAGNOSIS — G309 Alzheimer's disease, unspecified: Secondary | ICD-10-CM | POA: Diagnosis not present

## 2018-01-26 DIAGNOSIS — I509 Heart failure, unspecified: Secondary | ICD-10-CM | POA: Diagnosis not present

## 2018-01-26 DIAGNOSIS — E46 Unspecified protein-calorie malnutrition: Secondary | ICD-10-CM | POA: Diagnosis not present

## 2018-01-26 DIAGNOSIS — K922 Gastrointestinal hemorrhage, unspecified: Secondary | ICD-10-CM | POA: Diagnosis not present

## 2018-01-26 DIAGNOSIS — R1310 Dysphagia, unspecified: Secondary | ICD-10-CM | POA: Diagnosis not present

## 2018-01-27 DIAGNOSIS — K219 Gastro-esophageal reflux disease without esophagitis: Secondary | ICD-10-CM | POA: Diagnosis not present

## 2018-01-27 DIAGNOSIS — E46 Unspecified protein-calorie malnutrition: Secondary | ICD-10-CM | POA: Diagnosis not present

## 2018-01-27 DIAGNOSIS — R1310 Dysphagia, unspecified: Secondary | ICD-10-CM | POA: Diagnosis not present

## 2018-01-27 DIAGNOSIS — K922 Gastrointestinal hemorrhage, unspecified: Secondary | ICD-10-CM | POA: Diagnosis not present

## 2018-01-27 DIAGNOSIS — G309 Alzheimer's disease, unspecified: Secondary | ICD-10-CM | POA: Diagnosis not present

## 2018-01-27 DIAGNOSIS — I10 Essential (primary) hypertension: Secondary | ICD-10-CM | POA: Diagnosis not present

## 2018-01-27 DIAGNOSIS — I509 Heart failure, unspecified: Secondary | ICD-10-CM | POA: Diagnosis not present

## 2018-01-27 DIAGNOSIS — R54 Age-related physical debility: Secondary | ICD-10-CM | POA: Diagnosis not present

## 2018-01-28 DIAGNOSIS — I509 Heart failure, unspecified: Secondary | ICD-10-CM | POA: Diagnosis not present

## 2018-01-28 DIAGNOSIS — K922 Gastrointestinal hemorrhage, unspecified: Secondary | ICD-10-CM | POA: Diagnosis not present

## 2018-01-28 DIAGNOSIS — E46 Unspecified protein-calorie malnutrition: Secondary | ICD-10-CM | POA: Diagnosis not present

## 2018-01-28 DIAGNOSIS — G309 Alzheimer's disease, unspecified: Secondary | ICD-10-CM | POA: Diagnosis not present

## 2018-01-28 DIAGNOSIS — I10 Essential (primary) hypertension: Secondary | ICD-10-CM | POA: Diagnosis not present

## 2018-01-28 DIAGNOSIS — R1310 Dysphagia, unspecified: Secondary | ICD-10-CM | POA: Diagnosis not present

## 2018-01-29 DIAGNOSIS — I10 Essential (primary) hypertension: Secondary | ICD-10-CM | POA: Diagnosis not present

## 2018-01-29 DIAGNOSIS — K922 Gastrointestinal hemorrhage, unspecified: Secondary | ICD-10-CM | POA: Diagnosis not present

## 2018-01-29 DIAGNOSIS — E46 Unspecified protein-calorie malnutrition: Secondary | ICD-10-CM | POA: Diagnosis not present

## 2018-01-29 DIAGNOSIS — G309 Alzheimer's disease, unspecified: Secondary | ICD-10-CM | POA: Diagnosis not present

## 2018-01-29 DIAGNOSIS — I509 Heart failure, unspecified: Secondary | ICD-10-CM | POA: Diagnosis not present

## 2018-01-29 DIAGNOSIS — R1310 Dysphagia, unspecified: Secondary | ICD-10-CM | POA: Diagnosis not present

## 2018-01-30 DIAGNOSIS — E46 Unspecified protein-calorie malnutrition: Secondary | ICD-10-CM | POA: Diagnosis not present

## 2018-01-30 DIAGNOSIS — K922 Gastrointestinal hemorrhage, unspecified: Secondary | ICD-10-CM | POA: Diagnosis not present

## 2018-01-30 DIAGNOSIS — I509 Heart failure, unspecified: Secondary | ICD-10-CM | POA: Diagnosis not present

## 2018-01-30 DIAGNOSIS — G309 Alzheimer's disease, unspecified: Secondary | ICD-10-CM | POA: Diagnosis not present

## 2018-01-30 DIAGNOSIS — I10 Essential (primary) hypertension: Secondary | ICD-10-CM | POA: Diagnosis not present

## 2018-01-30 DIAGNOSIS — R1310 Dysphagia, unspecified: Secondary | ICD-10-CM | POA: Diagnosis not present

## 2018-02-01 DIAGNOSIS — I10 Essential (primary) hypertension: Secondary | ICD-10-CM | POA: Diagnosis not present

## 2018-02-01 DIAGNOSIS — G309 Alzheimer's disease, unspecified: Secondary | ICD-10-CM | POA: Diagnosis not present

## 2018-02-01 DIAGNOSIS — I509 Heart failure, unspecified: Secondary | ICD-10-CM | POA: Diagnosis not present

## 2018-02-01 DIAGNOSIS — K922 Gastrointestinal hemorrhage, unspecified: Secondary | ICD-10-CM | POA: Diagnosis not present

## 2018-02-01 DIAGNOSIS — E46 Unspecified protein-calorie malnutrition: Secondary | ICD-10-CM | POA: Diagnosis not present

## 2018-02-01 DIAGNOSIS — R1310 Dysphagia, unspecified: Secondary | ICD-10-CM | POA: Diagnosis not present

## 2018-02-02 DIAGNOSIS — I10 Essential (primary) hypertension: Secondary | ICD-10-CM | POA: Diagnosis not present

## 2018-02-02 DIAGNOSIS — I509 Heart failure, unspecified: Secondary | ICD-10-CM | POA: Diagnosis not present

## 2018-02-02 DIAGNOSIS — R1310 Dysphagia, unspecified: Secondary | ICD-10-CM | POA: Diagnosis not present

## 2018-02-02 DIAGNOSIS — E46 Unspecified protein-calorie malnutrition: Secondary | ICD-10-CM | POA: Diagnosis not present

## 2018-02-02 DIAGNOSIS — G309 Alzheimer's disease, unspecified: Secondary | ICD-10-CM | POA: Diagnosis not present

## 2018-02-02 DIAGNOSIS — K922 Gastrointestinal hemorrhage, unspecified: Secondary | ICD-10-CM | POA: Diagnosis not present

## 2018-02-04 DIAGNOSIS — I509 Heart failure, unspecified: Secondary | ICD-10-CM | POA: Diagnosis not present

## 2018-02-04 DIAGNOSIS — I10 Essential (primary) hypertension: Secondary | ICD-10-CM | POA: Diagnosis not present

## 2018-02-04 DIAGNOSIS — R1310 Dysphagia, unspecified: Secondary | ICD-10-CM | POA: Diagnosis not present

## 2018-02-04 DIAGNOSIS — G309 Alzheimer's disease, unspecified: Secondary | ICD-10-CM | POA: Diagnosis not present

## 2018-02-04 DIAGNOSIS — E46 Unspecified protein-calorie malnutrition: Secondary | ICD-10-CM | POA: Diagnosis not present

## 2018-02-04 DIAGNOSIS — K922 Gastrointestinal hemorrhage, unspecified: Secondary | ICD-10-CM | POA: Diagnosis not present

## 2018-02-06 DIAGNOSIS — I509 Heart failure, unspecified: Secondary | ICD-10-CM | POA: Diagnosis not present

## 2018-02-06 DIAGNOSIS — I10 Essential (primary) hypertension: Secondary | ICD-10-CM | POA: Diagnosis not present

## 2018-02-06 DIAGNOSIS — E46 Unspecified protein-calorie malnutrition: Secondary | ICD-10-CM | POA: Diagnosis not present

## 2018-02-06 DIAGNOSIS — G309 Alzheimer's disease, unspecified: Secondary | ICD-10-CM | POA: Diagnosis not present

## 2018-02-06 DIAGNOSIS — R1310 Dysphagia, unspecified: Secondary | ICD-10-CM | POA: Diagnosis not present

## 2018-02-06 DIAGNOSIS — K922 Gastrointestinal hemorrhage, unspecified: Secondary | ICD-10-CM | POA: Diagnosis not present

## 2018-02-08 DIAGNOSIS — R1310 Dysphagia, unspecified: Secondary | ICD-10-CM | POA: Diagnosis not present

## 2018-02-08 DIAGNOSIS — E46 Unspecified protein-calorie malnutrition: Secondary | ICD-10-CM | POA: Diagnosis not present

## 2018-02-08 DIAGNOSIS — I509 Heart failure, unspecified: Secondary | ICD-10-CM | POA: Diagnosis not present

## 2018-02-08 DIAGNOSIS — K922 Gastrointestinal hemorrhage, unspecified: Secondary | ICD-10-CM | POA: Diagnosis not present

## 2018-02-08 DIAGNOSIS — I10 Essential (primary) hypertension: Secondary | ICD-10-CM | POA: Diagnosis not present

## 2018-02-08 DIAGNOSIS — G309 Alzheimer's disease, unspecified: Secondary | ICD-10-CM | POA: Diagnosis not present

## 2018-02-09 DIAGNOSIS — I10 Essential (primary) hypertension: Secondary | ICD-10-CM | POA: Diagnosis not present

## 2018-02-09 DIAGNOSIS — G309 Alzheimer's disease, unspecified: Secondary | ICD-10-CM | POA: Diagnosis not present

## 2018-02-09 DIAGNOSIS — K922 Gastrointestinal hemorrhage, unspecified: Secondary | ICD-10-CM | POA: Diagnosis not present

## 2018-02-09 DIAGNOSIS — I509 Heart failure, unspecified: Secondary | ICD-10-CM | POA: Diagnosis not present

## 2018-02-09 DIAGNOSIS — E46 Unspecified protein-calorie malnutrition: Secondary | ICD-10-CM | POA: Diagnosis not present

## 2018-02-09 DIAGNOSIS — R1310 Dysphagia, unspecified: Secondary | ICD-10-CM | POA: Diagnosis not present

## 2018-02-10 DIAGNOSIS — K922 Gastrointestinal hemorrhage, unspecified: Secondary | ICD-10-CM | POA: Diagnosis not present

## 2018-02-10 DIAGNOSIS — R1310 Dysphagia, unspecified: Secondary | ICD-10-CM | POA: Diagnosis not present

## 2018-02-10 DIAGNOSIS — E46 Unspecified protein-calorie malnutrition: Secondary | ICD-10-CM | POA: Diagnosis not present

## 2018-02-10 DIAGNOSIS — G309 Alzheimer's disease, unspecified: Secondary | ICD-10-CM | POA: Diagnosis not present

## 2018-02-10 DIAGNOSIS — I10 Essential (primary) hypertension: Secondary | ICD-10-CM | POA: Diagnosis not present

## 2018-02-10 DIAGNOSIS — I509 Heart failure, unspecified: Secondary | ICD-10-CM | POA: Diagnosis not present

## 2018-02-11 DIAGNOSIS — I509 Heart failure, unspecified: Secondary | ICD-10-CM | POA: Diagnosis not present

## 2018-02-11 DIAGNOSIS — R1310 Dysphagia, unspecified: Secondary | ICD-10-CM | POA: Diagnosis not present

## 2018-02-11 DIAGNOSIS — E46 Unspecified protein-calorie malnutrition: Secondary | ICD-10-CM | POA: Diagnosis not present

## 2018-02-11 DIAGNOSIS — I10 Essential (primary) hypertension: Secondary | ICD-10-CM | POA: Diagnosis not present

## 2018-02-11 DIAGNOSIS — G309 Alzheimer's disease, unspecified: Secondary | ICD-10-CM | POA: Diagnosis not present

## 2018-02-11 DIAGNOSIS — K922 Gastrointestinal hemorrhage, unspecified: Secondary | ICD-10-CM | POA: Diagnosis not present

## 2018-02-12 DIAGNOSIS — I10 Essential (primary) hypertension: Secondary | ICD-10-CM | POA: Diagnosis not present

## 2018-02-12 DIAGNOSIS — E46 Unspecified protein-calorie malnutrition: Secondary | ICD-10-CM | POA: Diagnosis not present

## 2018-02-12 DIAGNOSIS — K922 Gastrointestinal hemorrhage, unspecified: Secondary | ICD-10-CM | POA: Diagnosis not present

## 2018-02-12 DIAGNOSIS — R1310 Dysphagia, unspecified: Secondary | ICD-10-CM | POA: Diagnosis not present

## 2018-02-12 DIAGNOSIS — G309 Alzheimer's disease, unspecified: Secondary | ICD-10-CM | POA: Diagnosis not present

## 2018-02-12 DIAGNOSIS — I509 Heart failure, unspecified: Secondary | ICD-10-CM | POA: Diagnosis not present

## 2018-02-13 DIAGNOSIS — I509 Heart failure, unspecified: Secondary | ICD-10-CM | POA: Diagnosis not present

## 2018-02-13 DIAGNOSIS — R1310 Dysphagia, unspecified: Secondary | ICD-10-CM | POA: Diagnosis not present

## 2018-02-13 DIAGNOSIS — G309 Alzheimer's disease, unspecified: Secondary | ICD-10-CM | POA: Diagnosis not present

## 2018-02-13 DIAGNOSIS — I10 Essential (primary) hypertension: Secondary | ICD-10-CM | POA: Diagnosis not present

## 2018-02-13 DIAGNOSIS — E46 Unspecified protein-calorie malnutrition: Secondary | ICD-10-CM | POA: Diagnosis not present

## 2018-02-13 DIAGNOSIS — K922 Gastrointestinal hemorrhage, unspecified: Secondary | ICD-10-CM | POA: Diagnosis not present

## 2018-02-15 DIAGNOSIS — R1310 Dysphagia, unspecified: Secondary | ICD-10-CM | POA: Diagnosis not present

## 2018-02-15 DIAGNOSIS — K922 Gastrointestinal hemorrhage, unspecified: Secondary | ICD-10-CM | POA: Diagnosis not present

## 2018-02-15 DIAGNOSIS — G309 Alzheimer's disease, unspecified: Secondary | ICD-10-CM | POA: Diagnosis not present

## 2018-02-15 DIAGNOSIS — I10 Essential (primary) hypertension: Secondary | ICD-10-CM | POA: Diagnosis not present

## 2018-02-15 DIAGNOSIS — E46 Unspecified protein-calorie malnutrition: Secondary | ICD-10-CM | POA: Diagnosis not present

## 2018-02-15 DIAGNOSIS — I509 Heart failure, unspecified: Secondary | ICD-10-CM | POA: Diagnosis not present

## 2018-02-16 DIAGNOSIS — R1310 Dysphagia, unspecified: Secondary | ICD-10-CM | POA: Diagnosis not present

## 2018-02-16 DIAGNOSIS — K922 Gastrointestinal hemorrhage, unspecified: Secondary | ICD-10-CM | POA: Diagnosis not present

## 2018-02-16 DIAGNOSIS — I509 Heart failure, unspecified: Secondary | ICD-10-CM | POA: Diagnosis not present

## 2018-02-16 DIAGNOSIS — G309 Alzheimer's disease, unspecified: Secondary | ICD-10-CM | POA: Diagnosis not present

## 2018-02-16 DIAGNOSIS — E46 Unspecified protein-calorie malnutrition: Secondary | ICD-10-CM | POA: Diagnosis not present

## 2018-02-16 DIAGNOSIS — I10 Essential (primary) hypertension: Secondary | ICD-10-CM | POA: Diagnosis not present

## 2018-02-17 DIAGNOSIS — I509 Heart failure, unspecified: Secondary | ICD-10-CM | POA: Diagnosis not present

## 2018-02-17 DIAGNOSIS — E46 Unspecified protein-calorie malnutrition: Secondary | ICD-10-CM | POA: Diagnosis not present

## 2018-02-17 DIAGNOSIS — K922 Gastrointestinal hemorrhage, unspecified: Secondary | ICD-10-CM | POA: Diagnosis not present

## 2018-02-17 DIAGNOSIS — G309 Alzheimer's disease, unspecified: Secondary | ICD-10-CM | POA: Diagnosis not present

## 2018-02-17 DIAGNOSIS — R1310 Dysphagia, unspecified: Secondary | ICD-10-CM | POA: Diagnosis not present

## 2018-02-17 DIAGNOSIS — I10 Essential (primary) hypertension: Secondary | ICD-10-CM | POA: Diagnosis not present

## 2018-02-18 DIAGNOSIS — I10 Essential (primary) hypertension: Secondary | ICD-10-CM | POA: Diagnosis not present

## 2018-02-18 DIAGNOSIS — R1310 Dysphagia, unspecified: Secondary | ICD-10-CM | POA: Diagnosis not present

## 2018-02-18 DIAGNOSIS — E46 Unspecified protein-calorie malnutrition: Secondary | ICD-10-CM | POA: Diagnosis not present

## 2018-02-18 DIAGNOSIS — I509 Heart failure, unspecified: Secondary | ICD-10-CM | POA: Diagnosis not present

## 2018-02-18 DIAGNOSIS — K922 Gastrointestinal hemorrhage, unspecified: Secondary | ICD-10-CM | POA: Diagnosis not present

## 2018-02-18 DIAGNOSIS — G309 Alzheimer's disease, unspecified: Secondary | ICD-10-CM | POA: Diagnosis not present

## 2018-02-19 DIAGNOSIS — K922 Gastrointestinal hemorrhage, unspecified: Secondary | ICD-10-CM | POA: Diagnosis not present

## 2018-02-19 DIAGNOSIS — I10 Essential (primary) hypertension: Secondary | ICD-10-CM | POA: Diagnosis not present

## 2018-02-19 DIAGNOSIS — R1310 Dysphagia, unspecified: Secondary | ICD-10-CM | POA: Diagnosis not present

## 2018-02-19 DIAGNOSIS — G309 Alzheimer's disease, unspecified: Secondary | ICD-10-CM | POA: Diagnosis not present

## 2018-02-19 DIAGNOSIS — E46 Unspecified protein-calorie malnutrition: Secondary | ICD-10-CM | POA: Diagnosis not present

## 2018-02-19 DIAGNOSIS — I509 Heart failure, unspecified: Secondary | ICD-10-CM | POA: Diagnosis not present

## 2018-02-21 DIAGNOSIS — E46 Unspecified protein-calorie malnutrition: Secondary | ICD-10-CM | POA: Diagnosis not present

## 2018-02-21 DIAGNOSIS — R1310 Dysphagia, unspecified: Secondary | ICD-10-CM | POA: Diagnosis not present

## 2018-02-21 DIAGNOSIS — K922 Gastrointestinal hemorrhage, unspecified: Secondary | ICD-10-CM | POA: Diagnosis not present

## 2018-02-21 DIAGNOSIS — I10 Essential (primary) hypertension: Secondary | ICD-10-CM | POA: Diagnosis not present

## 2018-02-21 DIAGNOSIS — G309 Alzheimer's disease, unspecified: Secondary | ICD-10-CM | POA: Diagnosis not present

## 2018-02-21 DIAGNOSIS — I509 Heart failure, unspecified: Secondary | ICD-10-CM | POA: Diagnosis not present

## 2018-02-23 DIAGNOSIS — I509 Heart failure, unspecified: Secondary | ICD-10-CM | POA: Diagnosis not present

## 2018-02-23 DIAGNOSIS — R1310 Dysphagia, unspecified: Secondary | ICD-10-CM | POA: Diagnosis not present

## 2018-02-23 DIAGNOSIS — I10 Essential (primary) hypertension: Secondary | ICD-10-CM | POA: Diagnosis not present

## 2018-02-23 DIAGNOSIS — E46 Unspecified protein-calorie malnutrition: Secondary | ICD-10-CM | POA: Diagnosis not present

## 2018-02-23 DIAGNOSIS — K922 Gastrointestinal hemorrhage, unspecified: Secondary | ICD-10-CM | POA: Diagnosis not present

## 2018-02-23 DIAGNOSIS — G309 Alzheimer's disease, unspecified: Secondary | ICD-10-CM | POA: Diagnosis not present

## 2018-02-24 DIAGNOSIS — K922 Gastrointestinal hemorrhage, unspecified: Secondary | ICD-10-CM | POA: Diagnosis not present

## 2018-02-24 DIAGNOSIS — I10 Essential (primary) hypertension: Secondary | ICD-10-CM | POA: Diagnosis not present

## 2018-02-24 DIAGNOSIS — G309 Alzheimer's disease, unspecified: Secondary | ICD-10-CM | POA: Diagnosis not present

## 2018-02-24 DIAGNOSIS — E46 Unspecified protein-calorie malnutrition: Secondary | ICD-10-CM | POA: Diagnosis not present

## 2018-02-24 DIAGNOSIS — I509 Heart failure, unspecified: Secondary | ICD-10-CM | POA: Diagnosis not present

## 2018-02-24 DIAGNOSIS — R1310 Dysphagia, unspecified: Secondary | ICD-10-CM | POA: Diagnosis not present

## 2018-02-25 DIAGNOSIS — G309 Alzheimer's disease, unspecified: Secondary | ICD-10-CM | POA: Diagnosis not present

## 2018-02-25 DIAGNOSIS — I10 Essential (primary) hypertension: Secondary | ICD-10-CM | POA: Diagnosis not present

## 2018-02-25 DIAGNOSIS — R1310 Dysphagia, unspecified: Secondary | ICD-10-CM | POA: Diagnosis not present

## 2018-02-25 DIAGNOSIS — I509 Heart failure, unspecified: Secondary | ICD-10-CM | POA: Diagnosis not present

## 2018-02-25 DIAGNOSIS — E46 Unspecified protein-calorie malnutrition: Secondary | ICD-10-CM | POA: Diagnosis not present

## 2018-02-25 DIAGNOSIS — K922 Gastrointestinal hemorrhage, unspecified: Secondary | ICD-10-CM | POA: Diagnosis not present

## 2018-02-26 DIAGNOSIS — E46 Unspecified protein-calorie malnutrition: Secondary | ICD-10-CM | POA: Diagnosis not present

## 2018-02-26 DIAGNOSIS — I10 Essential (primary) hypertension: Secondary | ICD-10-CM | POA: Diagnosis not present

## 2018-02-26 DIAGNOSIS — R1310 Dysphagia, unspecified: Secondary | ICD-10-CM | POA: Diagnosis not present

## 2018-02-26 DIAGNOSIS — K922 Gastrointestinal hemorrhage, unspecified: Secondary | ICD-10-CM | POA: Diagnosis not present

## 2018-02-26 DIAGNOSIS — I509 Heart failure, unspecified: Secondary | ICD-10-CM | POA: Diagnosis not present

## 2018-02-26 DIAGNOSIS — G309 Alzheimer's disease, unspecified: Secondary | ICD-10-CM | POA: Diagnosis not present

## 2018-02-27 DIAGNOSIS — K219 Gastro-esophageal reflux disease without esophagitis: Secondary | ICD-10-CM | POA: Diagnosis not present

## 2018-02-27 DIAGNOSIS — R1310 Dysphagia, unspecified: Secondary | ICD-10-CM | POA: Diagnosis not present

## 2018-02-27 DIAGNOSIS — I10 Essential (primary) hypertension: Secondary | ICD-10-CM | POA: Diagnosis not present

## 2018-02-27 DIAGNOSIS — K922 Gastrointestinal hemorrhage, unspecified: Secondary | ICD-10-CM | POA: Diagnosis not present

## 2018-02-27 DIAGNOSIS — I509 Heart failure, unspecified: Secondary | ICD-10-CM | POA: Diagnosis not present

## 2018-02-27 DIAGNOSIS — G309 Alzheimer's disease, unspecified: Secondary | ICD-10-CM | POA: Diagnosis not present

## 2018-02-27 DIAGNOSIS — E46 Unspecified protein-calorie malnutrition: Secondary | ICD-10-CM | POA: Diagnosis not present

## 2018-02-27 DIAGNOSIS — R54 Age-related physical debility: Secondary | ICD-10-CM | POA: Diagnosis not present

## 2018-02-28 DIAGNOSIS — R1310 Dysphagia, unspecified: Secondary | ICD-10-CM | POA: Diagnosis not present

## 2018-02-28 DIAGNOSIS — E46 Unspecified protein-calorie malnutrition: Secondary | ICD-10-CM | POA: Diagnosis not present

## 2018-02-28 DIAGNOSIS — G309 Alzheimer's disease, unspecified: Secondary | ICD-10-CM | POA: Diagnosis not present

## 2018-02-28 DIAGNOSIS — I509 Heart failure, unspecified: Secondary | ICD-10-CM | POA: Diagnosis not present

## 2018-02-28 DIAGNOSIS — I10 Essential (primary) hypertension: Secondary | ICD-10-CM | POA: Diagnosis not present

## 2018-02-28 DIAGNOSIS — K922 Gastrointestinal hemorrhage, unspecified: Secondary | ICD-10-CM | POA: Diagnosis not present

## 2018-03-29 DEATH — deceased
